# Patient Record
Sex: Female | Born: 1970
Health system: Southern US, Community
[De-identification: ages and names within clinical notes are randomized; demographics above are authoritative.]

## PROBLEM LIST (undated history)

## (undated) DIAGNOSIS — F419 Anxiety disorder, unspecified: Secondary | ICD-10-CM

## (undated) DIAGNOSIS — F17200 Nicotine dependence, unspecified, uncomplicated: Secondary | ICD-10-CM

## (undated) DIAGNOSIS — M47816 Spondylosis without myelopathy or radiculopathy, lumbar region: Secondary | ICD-10-CM

## (undated) DIAGNOSIS — M1990 Unspecified osteoarthritis, unspecified site: Secondary | ICD-10-CM

## (undated) DIAGNOSIS — F32A Depression, unspecified: Secondary | ICD-10-CM

## (undated) DIAGNOSIS — K219 Gastro-esophageal reflux disease without esophagitis: Secondary | ICD-10-CM

## (undated) DIAGNOSIS — F4310 Post-traumatic stress disorder, unspecified: Secondary | ICD-10-CM

## (undated) DIAGNOSIS — I639 Cerebral infarction, unspecified: Secondary | ICD-10-CM

## (undated) DIAGNOSIS — M797 Fibromyalgia: Secondary | ICD-10-CM

## (undated) DIAGNOSIS — I82409 Acute embolism and thrombosis of unspecified deep veins of unspecified lower extremity: Secondary | ICD-10-CM

## (undated) DIAGNOSIS — G4730 Sleep apnea, unspecified: Secondary | ICD-10-CM

## (undated) DIAGNOSIS — E669 Obesity, unspecified: Secondary | ICD-10-CM

## (undated) DIAGNOSIS — G709 Myoneural disorder, unspecified: Secondary | ICD-10-CM

## (undated) DIAGNOSIS — F329 Major depressive disorder, single episode, unspecified: Secondary | ICD-10-CM

## (undated) DIAGNOSIS — R7303 Prediabetes: Secondary | ICD-10-CM

## (undated) DIAGNOSIS — G35 Multiple sclerosis: Secondary | ICD-10-CM

## (undated) DIAGNOSIS — G43909 Migraine, unspecified, not intractable, without status migrainosus: Secondary | ICD-10-CM

## (undated) DIAGNOSIS — I10 Essential (primary) hypertension: Secondary | ICD-10-CM

## (undated) DIAGNOSIS — R569 Unspecified convulsions: Secondary | ICD-10-CM

## (undated) DIAGNOSIS — IMO0001 Reserved for inherently not codable concepts without codable children: Secondary | ICD-10-CM

## (undated) HISTORY — DX: Cerebral infarction, unspecified: I63.9

## (undated) HISTORY — DX: Nicotine dependence, unspecified, uncomplicated: F17.200

## (undated) HISTORY — DX: Essential (primary) hypertension: I10

## (undated) HISTORY — DX: Migraine, unspecified, not intractable, without status migrainosus: G43.909

## (undated) HISTORY — DX: Reserved for inherently not codable concepts without codable children: IMO0001

## (undated) HISTORY — DX: Major depressive disorder, single episode, unspecified: F32.9

## (undated) HISTORY — DX: Unspecified convulsions: R56.9

## (undated) HISTORY — DX: Anxiety disorder, unspecified: F41.9

## (undated) HISTORY — PX: TONSILLECTOMY: SUR1361

## (undated) HISTORY — DX: Obesity, unspecified: E66.9

## (undated) HISTORY — DX: Depression, unspecified: F32.A

## (undated) HISTORY — DX: Multiple sclerosis: G35

## (undated) HISTORY — DX: Spondylosis without myelopathy or radiculopathy, lumbar region: M47.816

## (undated) HISTORY — DX: Acute embolism and thrombosis of unspecified deep veins of unspecified lower extremity: I82.409

## (undated) NOTE — *Deleted (*Deleted)
Based on recent imaging results, you have a new diagnosis of atherosclerosis.  This condition affects the blood vessels all over your body and significantly increases your risks of heart attack, stroke, kidney disease, and peripheral vascular disease in your legs and arms.   With known atherosclerosis, cholesterol must be managed very well to help reduce further build up in the blood vessels.  We recommend HDL (good cholesterol) greater than 50 and LDL (bad cholesterol) less than 70. In order to get to these numbers, strict diet management and a medication to help lower your cholesterol is recommended.   I recommend the following to help prevent further damage and complications:  Cholesterol medication, Crestor (rosuvastatin) 10 mg daily.   Low fat, cholesterol, and carbohydrate diet   Exercise (walking is great!) 30 minutes a day for at least 5 days out of the week.   Stop smoking- We can help with this. You can also call Call 1800-QUIT-NOW for help with stopping smoking. This resources helps by providing support and supplies to help with quitting.   All of the above recommendations will also help with your high blood pressure, fatty liver, pre-diabetes and blood sugar levels, and weight.   We will need to check labs in about 10 weeks to make sure the medication is doing what we expect and you are not experiencing any side effects. Please schedule a follow-up appointment in about 12 weeks and plan to have labs drawn in about 10 weeks to evaluate. If labs look good, we can monitor every 6 months to 1 year.   Please read the following information provided and let me know if you have any questions or concerns.   Atherosclerosis  Atherosclerosis is narrowing and hardening of the arteries. Arteries are blood vessels that carry blood from the heart to all parts of the body. This blood contains oxygen. Arteries can become narrow or clogged with a buildup of fat, cholesterol, calcium, and other  substances (plaque). Plaque decreases the amount of blood that can flow through the artery. Atherosclerosis can affect any artery in the body, including:  Heart arteries (coronary artery disease). This may cause a heart attack.  Brain arteries. This may cause a stroke (cerebrovascular accident).  Leg, arm, and pelvis arteries (peripheral artery disease). This may cause pain and numbness.  Kidney arteries. This may cause kidney (renal) failure. Treatment may slow the disease and prevent further damage to the heart, brain, peripheral arteries, and kidneys. What are the causes? Atherosclerosis develops slowly over many years. The inner layers of your arteries become damaged and allow the gradual buildup of plaque. The exact cause of atherosclerosis is not fully understood. Symptoms of atherosclerosis do not occur until the artery becomes narrow or blocked. What increases the risk? The following factors may make you more likely to develop this condition:  High blood pressure.  High cholesterol.  Being middle-aged or older.  Having a family history of atherosclerosis.  Having high blood fats (triglycerides).  Diabetes.  Being overweight.  Smoking tobacco.  Not exercising enough (sedentary lifestyle).  Having a substance in the blood called C-reactive protein (CRP). This is a sign of increased levels of inflammation in the body.  Sleep apnea.  Being stressed.  Drinking too much alcohol. What are the signs or symptoms? This condition may not cause any symptoms. If you have symptoms, they are caused by damage to an area of your body that is not getting enough blood.  Coronary artery disease may cause chest pain and  shortness of breath.  Decreased blood supply to your brain may cause a stroke. Signs of a stroke may include sudden: ? Weakness on one side of the body. ? Confusion. ? Changes in vision. ? Inability to speak or understand speech. ? Loss of balance, coordination,  or the ability to walk. ? Severe headache. ? Loss of consciousness.  Peripheral arterial disease may cause pain and numbness, often in the legs and hips.  Renal failure may cause fatigue, nausea, swelling, and itchy skin. How is this diagnosed? This condition is diagnosed based on your medical history and a physical exam. During the exam:  Your health care provider will: ? Check your pulse in different places. ? Listen for a "whooshing" sound over your arteries (bruit).  You may have tests, such as: ? Blood tests to check your levels of cholesterol, triglycerides, and CRP. ? Electrocardiogram (ECG) to check for heart damage. ? Chest X-ray to see if you have an enlarged heart, which is a sign of heart failure. ? Stress test to see how your heart reacts to exercise. ? Echocardiogram to get images of the inside of your heart. ? Ankle-brachial index to compare blood pressure in your arms to blood pressure in your ankles. ? Ultrasound of your peripheral arteries to check blood flow. ? CT scan to check for damage to your heart or brain. ? X-rays of blood vessels after dye has been injected (angiogram) to check blood flow. How is this treated? Treatment starts with lifestyle changes, which may include:  Changing your diet.  Losing weight.  Reducing stress.  Exercising and being physically active more regularly.  Not smoking. You may also need medicine to:  Lower triglycerides and cholesterol.  Control blood pressure.  Prevent blood clots.  Lower inflammation in your body.  Control your blood sugar. Sometimes, surgery is needed to:  Remove plaque from an artery (endarterectomy).  Open or widen a narrowed heart artery (angioplasty).  Create a new path for your blood with one of these procedures: ? Heart (coronary) artery bypass graft surgery. ? Peripheral artery bypass graft surgery. Follow these instructions at home: Eating and drinking   Eat a heart-healthy diet.  Talk with your health care provider or a diet and nutrition specialist (dietitian) if you need help. A heart-healthy diet involves: ? Limiting unhealthy fats and increasing healthy fats. Some examples of healthy fats are olive oil and canola oil. ? Eating plant-based foods, such as fruits, vegetables, nuts, whole grains, and legumes (such as peas and lentils).  Limit alcohol intake to no more than 1 drink a day for nonpregnant women and 2 drinks a day for men. One drink equals 12 oz of beer, 5 oz of wine, or 1 oz of hard liquor. Lifestyle  Follow an exercise program as told by your health care provider.  Maintain a healthy weight. Lose weight if your health care provider says that you need to do that.  Rest when you are tired.  Learn to manage your stress.  Do not use any products that contain nicotine or tobacco, such as cigarettes and e-cigarettes. If you need help quitting, ask your health care provider.  Do not abuse drugs. General instructions  Take over-the-counter and prescription medicines only as told by your health care provider.  Manage other health conditions as told by your health care provider.  Keep all follow-up visits as told by your health care provider. This is important. Contact a health care provider if:  You have chest pain  or discomfort. This includes squeezing chest pain that may feel like indigestion (angina).  You have shortness of breath.  You have an irregular heartbeat.  You have unexplained fatigue.  You have unexplained pain or numbness in an arm, leg, or hip.  You have nausea, swelling of your hands or feet, and itchy skin. Get help right away if:  You have any symptoms of a heart attack, such as: ? Chest pain. ? Shortness of breath. ? Pain in your neck, jaw, arms, back, or stomach. ? Cold sweat. ? Nausea. ? Light-headedness.  You have any symptoms of a stroke. "BE FAST" is an easy way to remember the main warning signs of a stroke: ?  B - Balance. Signs are dizziness, sudden trouble walking, or loss of balance. ? E - Eyes. Signs are trouble seeing or a sudden change in vision. ? F - Face. Signs are sudden weakness or numbness of the face, or the face or eyelid drooping on one side. ? A - Arms. Signs are weakness or numbness in an arm. This happens suddenly and usually on one side of the body. ? S - Speech. Signs are sudden trouble speaking, slurred speech, or trouble understanding what people say. ? T - Time. Time to call emergency services. Write down what time symptoms started.  You have other signs of a stroke, such as: ? A sudden, severe headache with no known cause. ? Nausea or vomiting. ? Seizure. These symptoms may represent a serious problem that is an emergency. Do not wait to see if the symptoms will go away. Get medical help right away. Call your local emergency services (911 in the [place] Do not drive yourself to the hospital. Summary  Atherosclerosis is narrowing and hardening of the arteries.  Arteries can become narrow or clogged with a buildup of fat, cholesterol, calcium, and other substances (plaque).  This condition may not cause any symptoms. If you do have symptoms, they are caused by damage to an area of your body that is not getting enough blood.  Treatment may include lifestyle changes and medicines. In some cases, surgery is needed. This information is not intended to replace advice given to you by your health care provider. Make sure you discuss any questions you have with your health care provider. Document Revised: 08/10/2017 Document Reviewed: 01/04/2017 Elsevier Patient Education  2020 Elsevier Inc.  Rosuvastatin Tablets What is this medicine? ROSUVASTATIN (roe SOO va sta tin) is known as a HMG-CoA reductase inhibitor or 'statin'. It lowers cholesterol and triglycerides in the blood. This drug may also reduce the risk of heart attack, stroke, or other health problems in patients with risk  factors for heart disease. Diet and lifestyle changes are often used with this drug. This medicine may be used for other purposes; ask your health care provider or pharmacist if you have questions. COMMON BRAND NAME(S): Crestor What should I tell my health care provider before I take this medicine? They need to know if you have any of these conditions:  diabetes  if you often drink alcohol  history of stroke  kidney disease  liver disease  muscle aches or weakness  thyroid disease  an unusual or allergic reaction to rosuvastatin, other medicines, foods, dyes, or preservatives  pregnant or trying to get pregnant  breast-feeding How should I use this medicine? Take this medicine by mouth with a glass of water. Follow the directions on the prescription label. Do not cut, crush or chew this medicine. You can  take this medicine with or without food. Take your doses at regular intervals. Do not take your medicine more often than directed. Talk to your pediatrician regarding the use of this medicine in children. While this drug may be prescribed for children as young as 81 years old for selected conditions, precautions do apply. Overdosage: If you think you have taken too much of this medicine contact a poison control center or emergency room at once. NOTE: This medicine is only for you. Do not share this medicine with others. What if I miss a dose? If you miss a dose, take it as soon as you can. If your next dose is to be taken in less than 12 hours, then do not take the missed dose. Take the next dose at your regular time. Do not take double or extra doses. What may interact with this medicine? Do not take this medicine with any of the following medications:  herbal medicines like red yeast rice This medicine may also interact with the following medications:  alcohol  antacids containing aluminum hydroxide or magnesium hydroxide  cyclosporine  other medicines for high  cholesterol  some medicines for HIV infection  warfarin This list may not describe all possible interactions. Give your health care provider a list of all the medicines, herbs, non-prescription drugs, or dietary supplements you use. Also tell them if you smoke, drink alcohol, or use illegal drugs. Some items may interact with your medicine. What should I watch for while using this medicine? Visit your doctor or health care professional for regular check-ups. You may need regular tests to make sure your liver is working properly. Your health care professional may tell you to stop taking this medicine if you develop muscle problems. If your muscle problems do not go away after stopping this medicine, contact your health care professional. Do not become pregnant while taking this medicine. Women should inform their health care professional if they wish to become pregnant or think they might be pregnant. There is a potential for serious side effects to an unborn child. Talk to your health care professional or pharmacist for more information. Do not breast-feed an infant while taking this medicine. This medicine may increase blood sugar. Ask your healthcare provider if changes in diet or medicines are needed if you have diabetes. If you are going to need surgery or other procedure, tell your doctor that you are using this medicine. This drug is only part of a total heart-health program. Your doctor or a dietician can suggest a low-cholesterol and low-fat diet to help. Avoid alcohol and smoking, and keep a proper exercise schedule. This medicine may cause a decrease in Co-Enzyme Q-10. You should make sure that you get enough Co-Enzyme Q-10 while you are taking this medicine. Discuss the foods you eat and the vitamins you take with your health care professional. What side effects may I notice from receiving this medicine? Side effects that you should report to your doctor or health care professional as soon  as possible:  allergic reactions like skin rash, itching or hives, swelling of the face, lips, or tongue  confusion  joint pain  loss of memory  redness, blistering, peeling or loosening of the skin, including inside the mouth  signs and symptoms of high blood sugar such as being more thirsty or hungry or having to urinate more than normal. You may also feel very tired or have blurry vision.  signs and symptoms of muscle injury like dark urine; trouble passing urine  or change in the amount of urine; unusually weak or tired; muscle pain or side or back pain  yellowing of the eyes or skin Side effects that usually do not require medical attention (report to your doctor or health care professional if they continue or are bothersome):  constipation  diarrhea  dizziness  gas  headache  nausea  stomach pain  trouble sleeping  upset stomach This list may not describe all possible side effects. Call your doctor for medical advice about side effects. You may report side effects to FDA at 1-800-FDA-1088. Where should I keep my medicine? Keep out of the reach of children. Store at room temperature between 20 and 25 degrees C (68 and 77 degrees F). Keep container tightly closed (protect from moisture). Throw away any unused medicine after the expiration date. NOTE: This sheet is a summary. It may not cover all possible information. If you have questions about this medicine, talk to your doctor, pharmacist, or health care provider.  2020 Elsevier/Gold Standard (2018-02-21 08:25:08)   Steps to Quit Smoking Smoking tobacco is the leading cause of preventable death. It can affect almost every organ in the body. Smoking puts you and people around you at risk for many serious, long-lasting (chronic) diseases. Quitting smoking can be hard, but it is one of the best things that you can do for your health. It is never too late to quit. How do I get ready to quit? When you decide to quit  smoking, make a plan to help you succeed. Before you quit:  Pick a date to quit. Set a date within the next 2 weeks to give you time to prepare.  Write down the reasons why you are quitting. Keep this list in places where you will see it often.  Tell your family, friends, and co-workers that you are quitting. Their support is important.  Talk with your doctor about the choices that may help you quit.  Find out if your health insurance will pay for these treatments.  Know the people, places, things, and activities that make you want to smoke (triggers). Avoid them. What first steps can I take to quit smoking?  Throw away all cigarettes at home, at work, and in your car.  Throw away the things that you use when you smoke, such as ashtrays and lighters.  Clean your car. Make sure to empty the ashtray.  Clean your home, including curtains and carpets. What can I do to help me quit smoking? Talk with your doctor about taking medicines and seeing a counselor at the same time. You are more likely to succeed when you do both.  If you are pregnant or breastfeeding, talk with your doctor about counseling or other ways to quit smoking. Do not take medicine to help you quit smoking unless your doctor tells you to do so. To quit smoking: Quit right away  Quit smoking totally, instead of slowly cutting back on how much you smoke over a period of time.  Go to counseling. You are more likely to quit if you go to counseling sessions regularly. Take medicine You may take medicines to help you quit. Some medicines need a prescription, and some you can buy over-the-counter. Some medicines may contain a drug called nicotine to replace the nicotine in cigarettes. Medicines may:  Help you to stop having the desire to smoke (cravings).  Help to stop the problems that come when you stop smoking (withdrawal symptoms). Your doctor may ask you to use:  Nicotine patches, gum, or lozenges.  Nicotine  inhalers or sprays.  Non-nicotine medicine that is taken by mouth. Find resources Find resources and other ways to help you quit smoking and remain smoke-free after you quit. These resources are most helpful when you use them often. They include:  Online chats with a Veterinary surgeon.  Phone quitlines.  Printed Materials engineer.  Support groups or group counseling.  Text messaging programs.  Mobile phone apps. Use apps on your mobile phone or tablet that can help you stick to your quit plan. There are many free apps for mobile phones and tablets as well as websites. Examples include Quit Guide from the [employer] and smokefree.gov  What things can I do to make it easier to quit?   Talk to your family and friends. Ask them to support and encourage you.  Call a phone quitline (1-800-QUIT-NOW), reach out to support groups, or work with a Veterinary surgeon.  Ask people who smoke to not smoke around you.  Avoid places that make you want to smoke, such as: ? Bars. ? Parties. ? Smoke-break areas at work.  Spend time with people who do not smoke.  Lower the stress in your life. Stress can make you want to smoke. Try these things to help your stress: ? Getting regular exercise. ? Doing deep-breathing exercises. ? Doing yoga. ? Meditating. ? Doing a body scan. To do this, close your eyes, focus on one area of your body at a time from head to toe. Notice which parts of your body are tense. Try to relax the muscles in those areas. How will I feel when I quit smoking? Day 1 to 3 weeks Within the first 24 hours, you may start to have some problems that come from quitting tobacco. These problems are very bad 2-3 days after you quit, but they do not often last for more than 2-3 weeks. You may get these symptoms:  Mood swings.  Feeling restless, nervous, angry, or annoyed.  Trouble concentrating.  Dizziness.  Strong desire for high-sugar foods and nicotine.  Weight gain.  Trouble pooping  (constipation).  Feeling like you may vomit (nausea).  Coughing or a sore throat.  Changes in how the medicines that you take for other issues work in your body.  Depression.  Trouble sleeping (insomnia). Week 3 and afterward After the first 2-3 weeks of quitting, you may start to notice more positive results, such as:  Better sense of smell and taste.  Less coughing and sore throat.  Slower heart rate.  Lower blood pressure.  Clearer skin.  Better breathing.  Fewer sick days. Quitting smoking can be hard. Do not give up if you fail the first time. Some people need to try a few times before they succeed. Do your best to stick to your quit plan, and talk with your doctor if you have any questions or concerns. Summary  Smoking tobacco is the leading cause of preventable death. Quitting smoking can be hard, but it is one of the best things that you can do for your health.  When you decide to quit smoking, make a plan to help you succeed.  Quit smoking right away, not slowly over a period of time.  When you start quitting, seek help from your doctor, family, or friends. This information is not intended to replace advice given to you by your health care provider. Make sure you discuss any questions you have with your health care provider. Document Revised: 01/24/2019 Document Reviewed: 07/20/2018 Elsevier  Patient Education  [employer].  Exercising to Stay Healthy To become healthy and stay healthy, it is recommended that you do moderate-intensity and vigorous-intensity exercise. You can tell that you are exercising at a moderate intensity if your heart starts beating faster and you start breathing faster but can still hold a conversation. You can tell that you are exercising at a vigorous intensity if you are breathing much harder and faster and cannot hold a conversation while exercising. Exercising regularly is important. It has many health benefits, such as:   Improving overall fitness, flexibility, and endurance.  Increasing bone density.  Helping with weight control.  Decreasing body fat.  Increasing muscle strength.  Reducing stress and tension.  Improving overall health. How often should I exercise? Choose an activity that you enjoy, and set realistic goals. Your health care provider can help you make an activity plan that works for you. Exercise regularly as told by your health care provider. This may include:  Doing strength training two times a week, such as: ? Lifting weights. ? Using resistance bands. ? Push-ups. ? Sit-ups. ? Yoga.  Doing a certain intensity of exercise for a given amount of time. Choose from these options: ? A total of 150 minutes of moderate-intensity exercise every week. ? A total of 75 minutes of vigorous-intensity exercise every week. ? A mix of moderate-intensity and vigorous-intensity exercise every week. Children, pregnant women, people who have not exercised regularly, people who are overweight, and older adults may need to talk with a health care provider about what activities are safe to do. If you have a medical condition, be sure to talk with your health care provider before you start a new exercise program. What are some exercise ideas? Moderate-intensity exercise ideas include:  Walking 1 mile (1.6 km) in about 15 minutes.  Biking.  Hiking.  Golfing.  Dancing.  Water aerobics. Vigorous-intensity exercise ideas include:  Walking 4.5 miles (7.2 km) or more in about 1 hour.  Jogging or running 5 miles (8 km) in about 1 hour.  Biking 10 miles (16.1 km) or more in about 1 hour.  Lap swimming.  Roller-skating or in-line skating.  Cross-country skiing.  Vigorous competitive sports, such as football, basketball, and soccer.  Jumping rope.  Aerobic dancing. What are some everyday activities that can help me to get exercise?  Yard work, such as: ? Pushing a Surveyor, mining. ?  Raking and bagging leaves.  Washing your car.  Pushing a stroller.  Shoveling snow.  Gardening.  Washing windows or floors. How can I be more active in my day-to-day activities?  Use stairs instead of an elevator.  Take a walk during your lunch break.  If you drive, park your car farther away from your work or school.  If you take public transportation, get off one stop early and walk the rest of the way.  Stand up or walk around during all of your indoor phone calls.  Get up, stretch, and walk around every 30 minutes throughout the day.  Enjoy exercise with a friend. Support to continue exercising will help you keep a regular routine of activity. What guidelines can I follow while exercising?  Before you start a new exercise program, talk with your health care provider.  Do not exercise so much that you hurt yourself, feel dizzy, or get very short of breath.  Wear comfortable clothes and wear shoes with good support.  Drink plenty of water while you exercise to prevent dehydration or  heat stroke.  Work out until your breathing and your heartbeat get faster. Where to find more information  U.S. Department of Health and Human Services: ThisPath.fi  Centers for Disease Control and Prevention (CDC): FootballExhibition.com.br Summary  Exercising regularly is important. It will improve your overall fitness, flexibility, and endurance.  Regular exercise also will improve your overall health. It can help you control your weight, reduce stress, and improve your bone density.  Do not exercise so much that you hurt yourself, feel dizzy, or get very short of breath.  Before you start a new exercise program, talk with your health care provider. This information is not intended to replace advice given to you by your health care provider. Make sure you discuss any questions you have with your health care provider. Document Revised: 04/13/2017 Document Reviewed: 03/22/2017 Elsevier Patient  Education  2020 [employer].

---

## 1990-05-15 HISTORY — PX: TUBAL LIGATION: SHX77

## 2004-08-11 ENCOUNTER — Encounter: Payer: Self-pay | Admitting: Family Medicine

## 2004-08-11 LAB — HM PAP SMEAR

## 2004-08-11 LAB — CONVERTED CEMR LAB: Pap Smear: NORMAL

## 2007-07-17 LAB — HM MAMMOGRAPHY: HM Mammogram: NORMAL

## 2009-03-09 ENCOUNTER — Ambulatory Visit: Payer: Self-pay | Admitting: Family Medicine

## 2009-03-09 DIAGNOSIS — G43009 Migraine without aura, not intractable, without status migrainosus: Secondary | ICD-10-CM | POA: Insufficient documentation

## 2009-03-09 DIAGNOSIS — I10 Essential (primary) hypertension: Secondary | ICD-10-CM | POA: Insufficient documentation

## 2009-03-09 DIAGNOSIS — M47816 Spondylosis without myelopathy or radiculopathy, lumbar region: Secondary | ICD-10-CM | POA: Insufficient documentation

## 2009-03-09 DIAGNOSIS — E669 Obesity, unspecified: Secondary | ICD-10-CM

## 2009-03-09 DIAGNOSIS — G35 Multiple sclerosis: Secondary | ICD-10-CM | POA: Insufficient documentation

## 2009-03-09 DIAGNOSIS — M5136 Other intervertebral disc degeneration, lumbar region: Secondary | ICD-10-CM | POA: Insufficient documentation

## 2009-03-09 DIAGNOSIS — F17200 Nicotine dependence, unspecified, uncomplicated: Secondary | ICD-10-CM | POA: Insufficient documentation

## 2009-03-09 DIAGNOSIS — F411 Generalized anxiety disorder: Secondary | ICD-10-CM | POA: Insufficient documentation

## 2009-03-10 LAB — CONVERTED CEMR LAB
ALT: 9 units/L (ref 0–35)
AST: 16 units/L (ref 0–37)
Albumin: 3.9 g/dL (ref 3.5–5.2)
Alkaline Phosphatase: 62 units/L (ref 39–117)
BUN: 13 mg/dL (ref 6–23)
CO2: 27 meq/L (ref 19–32)
Calcium: 9.4 mg/dL (ref 8.4–10.5)
Chloride: 104 meq/L (ref 96–112)
Cholesterol: 190 mg/dL (ref 0–200)
Creatinine, Ser: 0.81 mg/dL (ref 0.40–1.20)
Glucose, Bld: 93 mg/dL (ref 70–99)
HCT: 46 % (ref 36.0–46.0)
HDL: 40 mg/dL (ref >39)
Hemoglobin: 14.5 g/dL (ref 12.0–15.0)
LDL Cholesterol: 124 mg/dL — ABNORMAL HIGH (ref 0–99)
MCHC: 31.5 g/dL (ref 30.0–36.0)
MCV: 89.8 fL (ref 78.0–100.0)
Platelets: 262 10*3/uL (ref 150–400)
Potassium: 5 meq/L (ref 3.5–5.3)
RBC: 5.12 M/uL — ABNORMAL HIGH (ref 3.87–5.11)
RDW: 13.7 % (ref 11.5–15.5)
Sodium: 140 meq/L (ref 135–145)
Total Bilirubin: 0.4 mg/dL (ref 0.3–1.2)
Total CHOL/HDL Ratio: 4.8
Total Protein: 6.3 g/dL (ref 6.0–8.3)
Triglycerides: 129 mg/dL (ref <150)
VLDL: 26 mg/dL (ref 0–40)
WBC: 7.7 10*3/uL (ref 4.0–10.5)

## 2009-03-29 ENCOUNTER — Telehealth: Payer: Self-pay | Admitting: Family Medicine

## 2009-04-21 ENCOUNTER — Telehealth: Payer: Self-pay | Admitting: Family Medicine

## 2009-05-15 DIAGNOSIS — R569 Unspecified convulsions: Secondary | ICD-10-CM

## 2009-05-15 HISTORY — DX: Unspecified convulsions: R56.9

## 2009-05-16 ENCOUNTER — Encounter: Payer: Self-pay | Admitting: Family Medicine

## 2009-05-16 DIAGNOSIS — F331 Major depressive disorder, recurrent, moderate: Secondary | ICD-10-CM | POA: Insufficient documentation

## 2009-06-01 ENCOUNTER — Ambulatory Visit: Payer: Self-pay | Admitting: Family Medicine

## 2009-06-01 DIAGNOSIS — N926 Irregular menstruation, unspecified: Secondary | ICD-10-CM | POA: Insufficient documentation

## 2009-06-01 DIAGNOSIS — J019 Acute sinusitis, unspecified: Secondary | ICD-10-CM | POA: Insufficient documentation

## 2009-06-14 ENCOUNTER — Telehealth (INDEPENDENT_AMBULATORY_CARE_PROVIDER_SITE_OTHER): Payer: Self-pay | Admitting: *Deleted

## 2009-06-14 ENCOUNTER — Encounter: Payer: Self-pay | Admitting: Family Medicine

## 2009-06-17 ENCOUNTER — Ambulatory Visit: Payer: Self-pay | Admitting: Family Medicine

## 2009-06-29 ENCOUNTER — Telehealth (INDEPENDENT_AMBULATORY_CARE_PROVIDER_SITE_OTHER): Payer: Self-pay | Admitting: *Deleted

## 2009-07-02 ENCOUNTER — Telehealth: Payer: Self-pay | Admitting: Family Medicine

## 2009-07-13 ENCOUNTER — Ambulatory Visit: Payer: Self-pay | Admitting: Family Medicine

## 2009-07-20 ENCOUNTER — Telehealth (INDEPENDENT_AMBULATORY_CARE_PROVIDER_SITE_OTHER): Payer: Self-pay | Admitting: *Deleted

## 2009-07-21 ENCOUNTER — Telehealth: Payer: Self-pay | Admitting: Family Medicine

## 2009-08-05 ENCOUNTER — Encounter: Payer: Self-pay | Admitting: Family Medicine

## 2009-09-15 ENCOUNTER — Telehealth: Payer: Self-pay | Admitting: Family Medicine

## 2009-12-01 ENCOUNTER — Telehealth: Payer: Self-pay | Admitting: Family Medicine

## 2009-12-13 DIAGNOSIS — I82409 Acute embolism and thrombosis of unspecified deep veins of unspecified lower extremity: Secondary | ICD-10-CM

## 2009-12-13 HISTORY — DX: Acute embolism and thrombosis of unspecified deep veins of unspecified lower extremity: I82.409

## 2010-01-06 ENCOUNTER — Encounter: Payer: Self-pay | Admitting: Family Medicine

## 2010-01-09 ENCOUNTER — Encounter: Payer: Self-pay | Admitting: Family Medicine

## 2010-01-10 ENCOUNTER — Encounter: Payer: Self-pay | Admitting: Family Medicine

## 2010-01-13 ENCOUNTER — Ambulatory Visit: Payer: Self-pay | Admitting: Family Medicine

## 2010-01-13 DIAGNOSIS — Z86718 Personal history of other venous thrombosis and embolism: Secondary | ICD-10-CM | POA: Insufficient documentation

## 2010-01-13 DIAGNOSIS — R569 Unspecified convulsions: Secondary | ICD-10-CM | POA: Insufficient documentation

## 2010-01-13 LAB — CONVERTED CEMR LAB: INR: 1.5

## 2010-01-18 ENCOUNTER — Encounter: Payer: Self-pay | Admitting: Family Medicine

## 2010-01-19 ENCOUNTER — Telehealth: Payer: Self-pay | Admitting: Family Medicine

## 2010-01-19 ENCOUNTER — Encounter: Payer: Self-pay | Admitting: Family Medicine

## 2010-01-19 LAB — CONVERTED CEMR LAB
INR: 1.8
Prothrombin Time: 18.8 s

## 2010-01-26 ENCOUNTER — Ambulatory Visit: Payer: Self-pay | Admitting: Family Medicine

## 2010-01-26 LAB — CONVERTED CEMR LAB: INR: 1.7

## 2010-01-28 ENCOUNTER — Telehealth: Payer: Self-pay | Admitting: Family Medicine

## 2010-02-03 ENCOUNTER — Telehealth (INDEPENDENT_AMBULATORY_CARE_PROVIDER_SITE_OTHER): Payer: Self-pay | Admitting: *Deleted

## 2010-02-07 ENCOUNTER — Ambulatory Visit: Payer: Self-pay | Admitting: Family Medicine

## 2010-02-07 LAB — CONVERTED CEMR LAB: INR: 1.7

## 2010-02-10 ENCOUNTER — Ambulatory Visit (HOSPITAL_COMMUNITY): Payer: Self-pay | Admitting: Psychology

## 2010-02-14 ENCOUNTER — Ambulatory Visit: Payer: Self-pay | Admitting: Family Medicine

## 2010-02-14 ENCOUNTER — Telehealth: Payer: Self-pay | Admitting: Family Medicine

## 2010-02-14 LAB — CONVERTED CEMR LAB: INR: 2.3

## 2010-02-28 ENCOUNTER — Ambulatory Visit: Payer: Self-pay | Admitting: Family Medicine

## 2010-02-28 LAB — CONVERTED CEMR LAB: INR: 2.1

## 2010-03-10 ENCOUNTER — Ambulatory Visit: Payer: Self-pay | Admitting: Family Medicine

## 2010-03-10 ENCOUNTER — Encounter: Admission: RE | Admit: 2010-03-10 | Discharge: 2010-03-10 | Payer: Self-pay | Admitting: Family Medicine

## 2010-03-10 DIAGNOSIS — R05 Cough: Secondary | ICD-10-CM

## 2010-03-10 DIAGNOSIS — R1012 Left upper quadrant pain: Secondary | ICD-10-CM | POA: Insufficient documentation

## 2010-03-10 DIAGNOSIS — R059 Cough, unspecified: Secondary | ICD-10-CM | POA: Insufficient documentation

## 2010-03-10 DIAGNOSIS — R197 Diarrhea, unspecified: Secondary | ICD-10-CM | POA: Insufficient documentation

## 2010-03-10 LAB — CONVERTED CEMR LAB: INR: 4

## 2010-03-11 LAB — CONVERTED CEMR LAB
ALT: 12 units/L (ref 0–35)
AST: 19 units/L (ref 0–37)
Albumin: 4.1 g/dL (ref 3.5–5.2)
Alkaline Phosphatase: 57 units/L (ref 39–117)
Amylase: 22 units/L (ref 0–105)
BUN: 11 mg/dL (ref 6–23)
Basophils Absolute: 0 10*3/uL (ref 0.0–0.1)
Basophils Relative: 0 % (ref 0–1)
CO2: 20 meq/L (ref 19–32)
Calcium: 8.9 mg/dL (ref 8.4–10.5)
Chloride: 108 meq/L (ref 96–112)
Creatinine, Ser: 0.96 mg/dL (ref 0.40–1.20)
Eosinophils Absolute: 0.2 10*3/uL (ref 0.0–0.7)
Eosinophils Relative: 2 % (ref 0–5)
Glucose, Bld: 97 mg/dL (ref 70–99)
HCT: 44 % (ref 36.0–46.0)
Hemoglobin: 13.9 g/dL (ref 12.0–15.0)
Lipase: 18 units/L (ref 0–75)
Lymphocytes Relative: 28 % (ref 12–46)
Lymphs Abs: 1.9 10*3/uL (ref 0.7–4.0)
MCHC: 31.6 g/dL (ref 30.0–36.0)
MCV: 82.1 fL (ref 78.0–100.0)
Monocytes Absolute: 0.5 10*3/uL (ref 0.1–1.0)
Monocytes Relative: 8 % (ref 3–12)
Neutro Abs: 4.3 10*3/uL (ref 1.7–7.7)
Neutrophils Relative %: 62 % (ref 43–77)
Platelets: 282 10*3/uL (ref 150–400)
Potassium: 3.5 meq/L (ref 3.5–5.3)
RBC: 5.36 M/uL — ABNORMAL HIGH (ref 3.87–5.11)
RDW: 15.3 % (ref 11.5–15.5)
Sodium: 138 meq/L (ref 135–145)
TSH: 0.473 microintl units/mL (ref 0.350–4.500)
Total Bilirubin: 0.4 mg/dL (ref 0.3–1.2)
Total Protein: 6.8 g/dL (ref 6.0–8.3)
WBC: 6.9 10*3/uL (ref 4.0–10.5)

## 2010-03-14 ENCOUNTER — Ambulatory Visit: Payer: Self-pay | Admitting: Family Medicine

## 2010-03-14 ENCOUNTER — Telehealth: Payer: Self-pay | Admitting: Family Medicine

## 2010-03-14 LAB — CONVERTED CEMR LAB: INR: 2.1

## 2010-03-21 ENCOUNTER — Ambulatory Visit: Payer: Self-pay | Admitting: Family Medicine

## 2010-03-21 DIAGNOSIS — R112 Nausea with vomiting, unspecified: Secondary | ICD-10-CM | POA: Insufficient documentation

## 2010-03-21 LAB — CONVERTED CEMR LAB: INR: 2.8

## 2010-03-31 ENCOUNTER — Telehealth: Payer: Self-pay | Admitting: Family Medicine

## 2010-04-01 ENCOUNTER — Encounter: Payer: Self-pay | Admitting: Family Medicine

## 2010-04-04 ENCOUNTER — Ambulatory Visit: Payer: Self-pay | Admitting: Family Medicine

## 2010-04-04 DIAGNOSIS — R209 Unspecified disturbances of skin sensation: Secondary | ICD-10-CM | POA: Insufficient documentation

## 2010-04-04 DIAGNOSIS — R5381 Other malaise: Secondary | ICD-10-CM | POA: Insufficient documentation

## 2010-04-04 DIAGNOSIS — R5383 Other fatigue: Secondary | ICD-10-CM | POA: Insufficient documentation

## 2010-04-04 LAB — CONVERTED CEMR LAB: INR: 1.2

## 2010-04-05 ENCOUNTER — Encounter: Payer: Self-pay | Admitting: Family Medicine

## 2010-04-05 LAB — CONVERTED CEMR LAB
Basophils Absolute: 0 10*3/uL (ref 0.0–0.1)
Basophils Relative: 0 % (ref 0–1)
Eosinophils Absolute: 0.2 10*3/uL (ref 0.0–0.7)
Eosinophils Relative: 3 % (ref 0–5)
Folate: 4.7 ng/mL
HCT: 38.8 % (ref 36.0–46.0)
Hemoglobin: 12.1 g/dL (ref 12.0–15.0)
Lymphocytes Relative: 35 % (ref 12–46)
Lymphs Abs: 2.4 10*3/uL (ref 0.7–4.0)
MCHC: 31.2 g/dL (ref 30.0–36.0)
MCV: 84.3 fL (ref 78.0–100.0)
Monocytes Absolute: 0.6 10*3/uL (ref 0.1–1.0)
Monocytes Relative: 8 % (ref 3–12)
Neutro Abs: 3.8 10*3/uL (ref 1.7–7.7)
Neutrophils Relative %: 54 % (ref 43–77)
Platelets: 274 10*3/uL (ref 150–400)
RBC: 4.6 M/uL (ref 3.87–5.11)
RDW: 15.3 % (ref 11.5–15.5)
TSH: 1.268 microintl units/mL (ref 0.350–4.500)
Vitamin B-12: 372 pg/mL (ref 211–911)
WBC: 6.9 10*3/uL (ref 4.0–10.5)

## 2010-04-18 ENCOUNTER — Ambulatory Visit: Payer: Self-pay | Admitting: Family Medicine

## 2010-04-18 LAB — CONVERTED CEMR LAB: INR: 1.1

## 2010-04-26 ENCOUNTER — Ambulatory Visit: Payer: Self-pay | Admitting: Family Medicine

## 2010-04-26 LAB — CONVERTED CEMR LAB: INR: 1.1

## 2010-05-02 ENCOUNTER — Ambulatory Visit: Payer: Self-pay | Admitting: Family Medicine

## 2010-05-02 LAB — CONVERTED CEMR LAB: INR: 1.2

## 2010-05-10 ENCOUNTER — Encounter: Payer: Self-pay | Admitting: Family Medicine

## 2010-05-17 ENCOUNTER — Ambulatory Visit
Admission: RE | Admit: 2010-05-17 | Discharge: 2010-05-17 | Payer: Self-pay | Source: Home / Self Care | Attending: Family Medicine | Admitting: Family Medicine

## 2010-05-17 ENCOUNTER — Encounter: Payer: Self-pay | Admitting: Family Medicine

## 2010-05-17 DIAGNOSIS — R42 Dizziness and giddiness: Secondary | ICD-10-CM | POA: Insufficient documentation

## 2010-05-17 DIAGNOSIS — I8390 Asymptomatic varicose veins of unspecified lower extremity: Secondary | ICD-10-CM | POA: Insufficient documentation

## 2010-05-17 DIAGNOSIS — Z780 Asymptomatic menopausal state: Secondary | ICD-10-CM | POA: Insufficient documentation

## 2010-05-17 LAB — CONVERTED CEMR LAB
Beta hcg, urine, semiquantitative: NEGATIVE
Bilirubin Urine: NEGATIVE
Glucose, Urine, Semiquant: NEGATIVE
INR: 1.3
Ketones, urine, test strip: NEGATIVE
Nitrite: NEGATIVE
Protein, U semiquant: NEGATIVE
Specific Gravity, Urine: 1.015
Urobilinogen, UA: 0.2
WBC Urine, dipstick: NEGATIVE
pH: 6.5

## 2010-05-18 ENCOUNTER — Telehealth: Payer: Self-pay | Admitting: Family Medicine

## 2010-05-18 LAB — CONVERTED CEMR LAB
Ferritin: 16 ng/mL (ref 10–291)
HCT: 42.9 % (ref 36.0–46.0)
Hemoglobin: 13.7 g/dL (ref 12.0–15.0)
Iron: 27 ug/dL — ABNORMAL LOW (ref 42–145)
MCHC: 31.9 g/dL (ref 30.0–36.0)
MCV: 86.3 fL (ref 78.0–100.0)
Platelets: 270 10*3/uL (ref 150–400)
RBC: 4.97 M/uL (ref 3.87–5.11)
RDW: 16.7 % — ABNORMAL HIGH (ref 11.5–15.5)
Saturation Ratios: 8 % — ABNORMAL LOW (ref 20–55)
TIBC: 351 ug/dL (ref 250–470)
TSH: 0.853 microintl units/mL (ref 0.350–4.500)
UIBC: 324 ug/dL
WBC: 8 10*3/uL (ref 4.0–10.5)

## 2010-05-25 ENCOUNTER — Telehealth: Payer: Self-pay | Admitting: Family Medicine

## 2010-05-25 ENCOUNTER — Encounter: Payer: Self-pay | Admitting: Family Medicine

## 2010-05-30 ENCOUNTER — Telehealth (INDEPENDENT_AMBULATORY_CARE_PROVIDER_SITE_OTHER): Payer: Self-pay | Admitting: *Deleted

## 2010-06-14 NOTE — Progress Notes (Signed)
Summary: Fall and refills   Phone Note Call from Patient   Summary of Call: Pt called stating that she fell 3x in 3 days and has injured her knee. She said there is hard knot on her knee and is surronded by a red bruise that keeps getting larger. I advised Pt go to the ED bc she may have a blood clot. Pt asked about refills on her pain meds and benzo and I informed Pt that they were too early to fill. Pt started saying that she was getting a higher dose of vicodin and was getting # 100 every month. I advised Pt that Dr. Cathey Endow will not change her dose or # at this time. I said she was supposed to see specialist for this but SHE has yet to go. Pt upest that it took 3 months to receive old records, I told Pt that records are out of our control. I reminded Pt that she called today for the ? blood clot in her leg and she should be more concerned about that at this and less concerned w/ her refills. Pt agreed.  Initial call taken by: Payton Spark CMA,  June 29, 2009 10:40 AM     Appended Document: Fall and refills  I will not fill her pain meds or benzos early.   she has been referred out for her "MS pain".  Seymour Bars, D.O.

## 2010-06-14 NOTE — Assessment & Plan Note (Signed)
Summary: INR  Nurse Visit   Vitals Entered By: Payton Spark CMA (March 21, 2010 10:16 AM) CC: Still c/o abd pain and loss of appetite.   Allergies: 1)  Ace Inhibitors Laboratory Results   Blood Tests      INR: 2.8   (Normal Range: 0.88-1.12   Therap INR: 2.0-3.5)    Orders Added: 1)  Fingerstick [36416] 2)  Protime INR [[address]]   Anticoagulation Management History:      The patient is on coumadin and comes in today for a routine follow up visit.  She is being anticoagulated because of the first episode of deep venous thrombosis and/or pulmonary embolism.  Anticipated length of treatment is 3-6 months.  Her last INR was 2.1 and today's INR is 2.8.    Anticoagulation Management Assessment/Plan:      The target INR is 2.0-3.0.         Current Anticoagulation Instructions: The patient is to continue with the same dose of coumadin.  This dosage includes:    Recheck FRI with Office visit for GI complaints  .Coumadin 3 mg tabs and Coumadin 3 mg tabs:  Sunday - 3 mg, Monday - 3 mg, Tuesday - 3 mg, Wednesday - 3 mg, Thursday - 3 mg, Friday - 3 mg, Saturday - 3 mg.     Appended Document: INR

## 2010-06-14 NOTE — Assessment & Plan Note (Signed)
Summary: sinusitis/ pain/ menses   Vital Signs:  Patient profile:   40 year old female Menstrual status:  regular Height:      66 inches Weight:      256 pounds BMI:     41.47 O2 Sat:      99 % on Room air Temp:     98.2 degrees F oral Pulse rate:   104 / minute BP sitting:   144 / 94  (left arm) Cuff size:   large  Vitals Entered By: Payton Spark CMA (June 01, 2009 2:12 PM)  O2 Flow:  Room air CC: F/U    Primary Care Provider:  Seymour Bars DO  CC:  F/U .  History of Present Illness: 40 yo WF presents for f/u visit.  She reports having head congestion x 1 month.  She started off with cold/ flu symptoms.  She still has head congestion and some cough.  No chest pain or SOB. She tried multiple cold meds.  She has pain over her frontal sinus and over maxillary region.  She is a smoker.  She has yet to see Dr Daphane Shepherd for MS and chronic sciatica.  She was previously on Vicodin 4 per day but I did not RF this for her.  She is taking Valium at night and Alprazolam in the morning.        Current Medications (verified): 1)  Topamax 100 Mg Tabs (Topiramate) .... Take 1 Tab By Mouth Two Times A Day 2)  Lisinopril 10 Mg Tabs (Lisinopril) .Marland Kitchen.. 1 Tab By Mouth Daily 3)  Diazepam 5 Mg Tabs (Diazepam) .Marland Kitchen.. 1 Tab By Mouth At Bedtime As Needed Back Pain3 4)  Alprazolam 1 Mg Tabs (Alprazolam) .Marland Kitchen.. 1 Tab By Mouth Qam As Needed Anxiety  Allergies (verified): 1)  Ace Inhibitors  Review of Systems      See HPI  Physical Exam  General:  obese WF in NAD, smells of cigarette smoke Head:  normocephalic and atraumatic.  sinuses TTP (maxilllary) Eyes:  conjunctiva clear Ears:  EACs patent; TMs translucent and gray with good cone of light and bony landmarks.  Nose:  nasal congestion present Mouth:  throat mildly injected Neck:  no masses.   Lungs:  Normal respiratory effort, chest expands symmetrically. Lungs are clear to auscultation, no crackles or wheezes. Heart:  Normal rate and  regular rhythm. S1 and S2 normal without gallop, murmur, click, rub or other extra sounds. Msk:  no joint swelling, no joint warmth, and no redness over joints.   Extremities:  no LE edema Neurologic:  gait normal.   Skin:  color normal.   Cervical Nodes:  No lymphadenopathy noted Psych:  flat affect.     Impression & Recommendations:  Problem # 1:  SCIATICA, CHRONIC (ICD-724.3) She has yet to f/u with Neuro for sciatica and MS.   We discussed appropriate treatment for both conditions.   I have agreed to fill #40 Vicodin for severe pain.  Her updated medication list for this problem includes:    Vicodin 5-500 Mg Tabs (Hydrocodone-acetaminophen) .Marland Kitchen... 1 tab by mouth q 8 hrs as needed severe pain  Problem # 2:  ESSENTIAL HYPERTENSION, BENIGN (ICD-401.1) Had ACEi cough so stopped her Lisinopril.   Changed her to Maxzide daily.  RTC in 3 mos to recheck BP and get a BMP. The following medications were removed from the medication list:    Lisinopril 10 Mg Tabs (Lisinopril) .Marland Kitchen... 1 tab by mouth daily Her updated medication list for  this problem includes:    Maxzide-25 37.5-25 Mg Tabs (Triamterene-hctz) .Marland Kitchen... 1 tab by mouth daily  BP today: 144/94 Prior BP: 136/77 (03/09/2009)  Labs Reviewed: K+: 5.0 (03/09/2009) Creat: : 0.81 (03/09/2009)   Chol: 190 (03/09/2009)   HDL: 40 (03/09/2009)   LDL: 124 (03/09/2009)   TG: 129 (03/09/2009)  Problem # 3:  ACUTE SINUSITIS, UNSPECIFIED (ICD-461.9) 1 month of head congestion and hx of sinus polyps. Treat with Amoxicillin. Avoid smoking.   Her updated medication list for this problem includes:    Amoxicillin 500 Mg Caps (Amoxicillin) .Marland Kitchen... 1 capsule by mouth three times a day x 10 days  Problem # 4:  IRREGULAR MENSES (ICD-626.4) Referral made for gyn down the hall.  She may be a good candidate for uterine ablation.  She is a smoker so not a candidate for OCPs.   Orders: Gynecologic Referral (Gyn)  Complete Medication List: 1)  Topamax 100  Mg Tabs (Topiramate) .... Take 1 tab by mouth two times a day 2)  Diazepam 10 Mg Tabs (Diazepam) .Marland Kitchen.. 1 tab by mouth at bedtime as needed muscle spasm 3)  Alprazolam 1 Mg Tabs (Alprazolam) .Marland Kitchen.. 1 tab by mouth qam as needed anxiety 4)  Vicodin 5-500 Mg Tabs (Hydrocodone-acetaminophen) .Marland Kitchen.. 1 tab by mouth q 8 hrs as needed severe pain 5)  Maxzide-25 37.5-25 Mg Tabs (Triamterene-hctz) .Marland Kitchen.. 1 tab by mouth daily 6)  Amoxicillin 500 Mg Caps (Amoxicillin) .Marland Kitchen.. 1 capsule by mouth three times a day x 10 days  Patient Instructions: 1)  F/U with Dr Izola Price (neuro) and opthamology. 2)  Referral made to Dr Jearld Lesch down the hall for irregular periods. 3)  Return for f/u elevated blood pressure in 3 months. Prescriptions: AMOXICILLIN 500 MG CAPS (AMOXICILLIN) 1 capsule by mouth three times a day x 10 days  #30 x 0   Entered and Authorized by:   Seymour Bars DO   Signed by:   Seymour Bars DO on 06/01/2009   Method used:   Electronically to        [facility] [phone]* (retail)       [address] [place], [place]  [address]       Ph: [phone]       Fax: [phone]   RxID:   [id number] MAXZIDE-25 37.5-25 MG TABS (TRIAMTERENE-HCTZ) 1 tab by mouth daily  #30 x 2   Entered and Authorized by:   Seymour Bars DO   Signed by:   Seymour Bars DO on 06/01/2009   Method used:   Electronically to        [facility] [phone]* (retail)       [address] [place], [place]  [address]       Ph: [phone]       Fax: [phone]   RxID:   [id number] VICODIN 5-500 MG TABS (HYDROCODONE-ACETAMINOPHEN) 1 tab by mouth q 8 hrs as needed severe pain  #40 x 0   Entered and Authorized by:   Seymour Bars DO   Signed by:   Seymour Bars DO on 06/01/2009   Method used:   Printed then faxed to ...       [facility] [phone]* (retail)       [address] [place], [place]  [address]       Ph: [phone]  Fax: [phone]   RxID:   [id number] TOPAMAX 100 MG TABS (TOPIRAMATE) Take 1 tab  by mouth two times a day  #60 x 3   Entered and Authorized by:   Seymour Bars DO   Signed by:   Seymour Bars DO on 06/01/2009   Method used:   Printed then faxed to ...       [facility] [phone]* (retail)       [address] [place], [place]  [address]       Ph: [phone]       Fax: [phone]   RxID:   [id number] ALPRAZOLAM 1 MG TABS (ALPRAZOLAM) 1 tab by mouth qAM as needed anxiety  #30 x 0   Entered and Authorized by:   Seymour Bars DO   Signed by:   Seymour Bars DO on 06/01/2009   Method used:   Printed then faxed to ...       [facility] [phone]* (retail)       [address] [place], [place]  [address]       Ph: [phone]       Fax: [phone]   RxID:   [id number] DIAZEPAM 10 MG TABS (DIAZEPAM) 1 tab by mouth at bedtime as needed muscle spasm  #30 x 0   Entered and Authorized by:   Seymour Bars DO   Signed by:   Seymour Bars DO on 06/01/2009   Method used:   Printed then faxed to ...       [facility] [phone]* (retail)       [address] [place], [place]  [address]       Ph: [phone]       Fax: [phone]   RxID:   [id number]

## 2010-06-14 NOTE — Letter (Signed)
Summary: Primary Care Consult Scheduled Letter  [facility] [place]  [address]   [place], [place] [address]   Phone: [phone]  Fax: [phone]      01/26/2010 MRN: [id number]  Ashley Gomez [address] [place], [place]  [address]    Dear Ms. Bigford,      We have scheduled an appointment for you.  At the recommendation of Dr.__Bowen____________________, we have scheduled you a consult with __Kathy Lindner__________________ on _9/29/2011_________________ at [facility]_____.  Their address is_[address]________. The office phone number is _[phone]___________.  If this appointment day and time is not convenient for you, please feel free to call the office of the doctor you are being referred to at the number listed above and reschedule the appointment.     It is important for you to keep your scheduled appointments. We are here to make sure you are given good patient care. If you have questions or you have made changes to your appointment, please notify us at  [phone]        , ask for   [facility]           .    Thank you,  Patient Care Coordinator [facility] [place]

## 2010-06-14 NOTE — Progress Notes (Signed)
Summary: PT/INR results  Phone Note From Other Clinic   Caller: Nurse [facility] Summary of Call: Dr. Theodoro Grist nurse called back stating Dr. Tyrell Antonio ordered labs on Pt yesterday and included PT/INR. Dr. Tyrell Antonio is not going to manage coumadin but included it so you would have the results for Pt.  PT 18.8 and INR 1.8  Pt would like to know if she needs U/S to see if clot is gone. Please advise. Initial call taken by: Payton Spark CMA,  January 19, 2010 11:10 AM    New/Updated Medications: COUMADIN 5 MG TABS (WARFARIN SODIUM) Sunday - 5 mg, Monday - 6 mg, Tuesday - 5 mg, Wednesday - 5 mg, Thursday - 5 mg, Friday - 6 mg, Saturday - 5 mg COUMADIN 1 MG TABS (WARFARIN SODIUM) Sunday - 5 mg, Monday - 6 mg, Tuesday - 5 mg, Wednesday - 5 mg, Thursday - 5 mg, Friday - 6 mg, Saturday - 5 mg Prescriptions: COUMADIN 1 MG TABS (WARFARIN SODIUM) Sunday - 5 mg, Monday - 6 mg, Tuesday - 5 mg, Wednesday - 5 mg, Thursday - 5 mg, Friday - 6 mg, Saturday - 5 mg  #30 x 0   Entered and Authorized by:   Breawna Bowen DO   Signed by:   Lujean Bowen DO on 01/19/2010   Method used:   Electronically to        [facility]  [id number]* (retail)       [address]       [place], [place]  [address]       Ph: [phone]       Fax: [phone]   RxID:   [id number] COUMADIN 5 MG TABS (WARFARIN SODIUM) Sunday - 5 mg, Monday - 6 mg, Tuesday - 5 mg, Wednesday - 5 mg, Thursday - 5 mg, Friday - 6 mg, Saturday - 5 mg  #30 x 2   Entered and Authorized by:   Keva Bowen DO   Signed by:   Noell Bowen DO on 01/19/2010   Method used:   Electronically to        [facility]  [id number]* (retail)       [address], [place]  [address]       Ph: [phone]       Fax: [phone]   RxID:   [id number]   Laboratory Results   Blood Tests     PT: 18.8 s   (Normal Range: 10.6-13.4)  INR: 1.8   (Normal Range: 0.88-1.12   Therap INR: 2.0-3.5)      Anticoagulation Management History:      Her anticoagulation  is being managed by telephone today.  She is being anticoagulated because of the first episode of deep venous thrombosis and/or pulmonary embolism.  Anticipated length of treatment is 3-6 months.  Her last INR was 1.5 and today's INR is 1.8.    Anticoagulation Management Assessment/Plan:      The target INR is 2.0-3.0.  She is to have a PT/INR in 1 week.         Current Anticoagulation Instructions: The patient's dosage of coumadin will be increased.  The new dosage includes:   Coumadin 5 mg tabs and Coumadin 1 mg tabs:  Sunday - 5 mg, Monday - 6 mg, Tuesday - 5 mg, Wednesday - 5 mg, Thursday - 5 mg, Friday - 6 mg, Saturday - 5 mg.    Repeat PT/INR  in 1 week.    Appended Document: PT/INR results Pt aware of the above

## 2010-06-14 NOTE — Assessment & Plan Note (Signed)
Summary: MS    Vital Signs:  Patient profile:   40 year old female Menstrual status:  regular Height:      66 inches Weight:      262 pounds BMI:     42.44 O2 Sat:      97 % on Room air Temp:     97.6 degrees F oral Pulse rate:   108 / minute BP sitting:   146 / 88  (left arm) Cuff size:   large  Vitals Entered By: Payton Spark CMA (July 13, 2009 4:24 PM)  O2 Flow:  Room air CC: Hip and leg pain. Had 3 falls in 5 days.  Pain Assessment Patient in pain? yes      Intensity: 10   Primary Care Provider:  Seymour Bars DO  CC:  Hip and leg pain. Had 3 falls in 5 days. Marland Kitchen  History of Present Illness: 40 yo WF presents for problems with bilateral hip pain that started about 2 wks ago.  She fell 3 x in 5 days.  She went to [facility] ED but says that this was for migraines and not for falls/ MS.  She has yet to see the neurologist for MS b/c of 'money'.  She thinks that she cannot work due to pain and weakness.    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  Her symptoms are affecting her job.  She has some paresthesias in the L hand on and off.  She is getting edema in both hands and feet.  No functional loss of hands.  She is R handed.  Her hx is complicated by financial stressors and 'failing' multiple other meds and chronic sciatica.    Current Medications (verified): 1)  Topamax 100 Mg Tabs (Topiramate) .... Take 1 Tab By Mouth Two Times A Day 2)  Diazepam 10 Mg Tabs (Diazepam) .Marland Kitchen.. 1 Tab By Mouth At Bedtime As Needed Muscle Spasm 3)  Alprazolam 1 Mg Tabs (Alprazolam) .Marland Kitchen.. 1 Tab By Mouth Qam As Needed Anxiety 4)  Vicodin 5-500 Mg Tabs (Hydrocodone-Acetaminophen) .Marland Kitchen.. 1 Tab By Mouth Q 8 Hrs As Needed Severe Pain 5)  Maxzide-25 37.5-25 Mg Tabs (Triamterene-Hctz) .Marland Kitchen.. 1 Tab By Mouth Daily  Allergies (verified): 1)  Ace Inhibitors  Past History:  Past Medical History: Reviewed history from 06/17/2009 and no changes required. MS diagnosed at age  32 HTN obesity smoking Depression/ Anxiety Migraines Lumbar facet hypertrophy; MRI 07  Neuro appt is next wk.  Social History: Reviewed history from 03/09/2009 and no changes required. 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   Occas ETOH. 5+ coffee/ day. 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  does some walking. Poor diet. Weight stable x 3 yrs.  Review of Systems      See HPI  Physical Exam  General:  alert, well-developed, well-nourished, and well-hydrated.  obese in NAD Lungs:  Normal respiratory effort, chest expands symmetrically. Lungs are clear to auscultation, no crackles or wheezes. Heart:  Normal rate and regular rhythm. S1 and S2 normal without gallop, murmur, click, rub or other extra sounds. Msk:  no joint swelling, no joint warmth, and no redness over joints.   Extremities:  trace LE edema Neurologic:  L>R LE weakness with resisting - hip flexion and leg extension.  Skin:  color normal.   Psych:  good eye contact, not anxious appearing, and flat affect.     Impression & Recommendations:  Problem # 1:  MULTIPLE SCLEROSIS,  RELAPSING/REMITTING (ICD-340)  She appears to be having a flare of her MS with pain and weakness, falls. I reminded her once again to r/s her appt with Dr Lenise Arena that was set up a few mos ago. She is resistant to trying neurontin or lyrica. Will give Toradol injection for pain today and start her on Prednisone dose pak and Vicodin for pain -- this is short term only.    Orders: Ketorolac-Toradol 15mg  (Y4034) Admin of Therapeutic Inj  intramuscular or subcutaneous ([address])  Complete Medication List: 1)  Topamax 100 Mg Tabs (Topiramate) .... Take 1 tab by mouth two times a day 2)  Diazepam 10 Mg Tabs (Diazepam) .Marland Kitchen.. 1 tab by mouth at bedtime as needed muscle spasm 3)  Alprazolam 1 Mg Tabs (Alprazolam) .Marland Kitchen.. 1 tab by mouth qam as needed anxiety 4)  Vicodin 5-500 Mg Tabs (Hydrocodone-acetaminophen) .Marland Kitchen.. 1-2 tabs by mouth q 8 hrs as needed  severe pain 5)  Maxzide-25 37.5-25 Mg Tabs (Triamterene-hctz) .Marland Kitchen.. 1 tab by mouth daily 6)  Prednisone (pak) 10 Mg Tabs (Prednisone) .... Take for 12 days  Patient Instructions: 1)  Toradol injection today. 2)  Use Vicodin for severe pain. 3)  Use Diazepam at bedtime.  10 mg is the max dose. 4)  Be sure to make appt with Dr Lenise Arena. 5)  I will not be RFing your pain meds. Prescriptions: PREDNISONE (PAK) 10 MG TABS (PREDNISONE) take for 12 days  #1 x 0   Entered and Authorized by:   Seymour Bars DO   Signed by:   Seymour Bars DO on 07/13/2009   Method used:   Electronically to        [facility] [phone]* (retail)       [address] [place], [place]  [address]       Ph: [phone]       Fax: [phone]   RxID:   [id number] VICODIN 5-500 MG TABS (HYDROCODONE-ACETAMINOPHEN) 1-2 tabs by mouth q 8 hrs as needed severe pain  #60 x 0   Entered and Authorized by:   Seymour Bars DO   Signed by:   Seymour Bars DO on 07/13/2009   Method used:   Printed then faxed to ...       [facility] [phone]* (retail)       [address] [place], [place]  [address]       Ph: [phone]       Fax: [phone]   RxID:   [id number]    Medication Administration  Injection # 1:    Medication: Ketorolac-Toradol 15mg     Diagnosis: MULTIPLE SCLEROSIS, RELAPSING/REMITTING (ICD-340)    Route: IM    Site: RUOQ gluteus    Exp Date: 12/14/2010    Lot #: [id number]    Comments: 60 mg    Patient tolerated injection without complications    Given by: Payton Spark CMA (July 14, 2009 8:23 AM)  Orders Added: 1)  Est. Patient Level III [93716] 2)  Ketorolac-Toradol 15mg  [J1885] 3)  Admin of Therapeutic Inj  intramuscular or subcutaneous [96789]

## 2010-06-14 NOTE — Letter (Signed)
Summary: Generic Letter  [facility] [place]  [address]   [place], [place] [address]   Phone: [phone]  Fax: [phone]    04/04/2010  Ashley Gomez [address] [place], [place]  [address]  To Whom It May Concern,  [name] is medically cleared to return to work full - time.         Sincerely,    Seymour Bars DO

## 2010-06-14 NOTE — Assessment & Plan Note (Signed)
Summary: migraines   Vital Signs:  Patient profile:   40 year old female Menstrual status:  regular Height:      66 inches Weight:      257.25 pounds Pulse rate:   91 / minute BP sitting:   164 / 94  Vitals Entered By: Kandice Hams (June 17, 2009 1:36 PM) CC: c/o  migraine headaches x 1 week, went to [facility]   Primary Care Provider:  Seymour Bars DO  CC:  c/o  migraine headaches x 1 week and went to [facility].  History of Present Illness: 40 yo WF presents for a migraine that began 7 days ago.  She has problems with loss of peripheral vision and numbness in her LUE.  She went to the ED last Friday.  She was given compazine, benadryl and 2  other drugs by IV which helped some.  The HA came back and has been worse.  She has not been able to sleep well or work.  She used Hydrocodone 2 tabs q 5 hrs which has not worked.  She has taken Valium which helped just a little.  She has some mild nausea and photophobia.    She has an initial appt with neurology for MS next wk.      Allergies: 1)  Ace Inhibitors  Past History:  Past Medical History: MS diagnosed at age 20 HTN obesity smoking Depression/ Anxiety Migraines Lumbar facet hypertrophy; MRI 07  Neuro appt is next wk.  Social History: Reviewed history from 03/09/2009 and no changes required. 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   Occas ETOH. 5+ coffee/ day. 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  does some walking. Poor diet. Weight stable x 3 yrs.  Review of Systems      See HPI  Physical Exam  General:  alert, well-developed, well-nourished, and well-hydrated.  obese in NAD Head:  normocephalic and atraumatic.   Eyes:  photosensitive.  grossly normal fundoscopic exam Mouth:  pharynx pink and moist.   Neck:  no masses.   Lungs:  Normal respiratory effort, chest expands symmetrically. Lungs are clear to auscultation, no crackles or wheezes. Heart:  Normal rate and regular rhythm. S1  and S2 normal without gallop, murmur, click, rub or other extra sounds. Extremities:  no LE edema Neurologic:  no tremor grip + 5/5 Skin:  color normal.   Cervical Nodes:  No lymphadenopathy noted Psych:  flat affect.     Impression & Recommendations:  Problem # 1:  MIGRAINE WITHOUT AURA (ICD-346.10)  Intractable migraine x 7 days. Treat w/ Toradol 60 mg IM x 1 followed by a Prednisone taper 80-60-40-20-10 stop. Has neuro f/u next wk.  Avoid migraine triggers.   Her updated medication list for this problem includes:    Vicodin 5-500 Mg Tabs (Hydrocodone-acetaminophen) .Marland Kitchen... 1 tab by mouth q 8 hrs as needed severe pain  Orders: Ketorolac-Toradol 15mg  (Z6109)  Complete Medication List: 1)  Topamax 100 Mg Tabs (Topiramate) .... Take 1 tab by mouth two times a day 2)  Diazepam 10 Mg Tabs (Diazepam) .Marland Kitchen.. 1 tab by mouth at bedtime as needed muscle spasm 3)  Alprazolam 1 Mg Tabs (Alprazolam) .Marland Kitchen.. 1 tab by mouth qam as needed anxiety 4)  Vicodin 5-500 Mg Tabs (Hydrocodone-acetaminophen) .Marland Kitchen.. 1 tab by mouth q 8 hrs as needed severe pain 5)  Maxzide-25 37.5-25 Mg Tabs (Triamterene-hctz) .Marland Kitchen.. 1 tab by mouth daily 6)  Prednisone 20 Mg Tabs (Prednisone) .... 4  tabs by mouth x 1 day then 3 tabs by mouth x 1 day then 2 tabs by mouth x 1 day then 1 tab by mouth x 1 day then 1/2 tab by mouth x 1 day  Patient Instructions: 1)  Toradol injection today. 2)  Start Prednisone taper tomorrow 80-60-40-20-10 stop. 3)  F/U with Neurology next wk for MS and Migraines. Prescriptions: VICODIN 5-500 MG TABS (HYDROCODONE-ACETAMINOPHEN) 1 tab by mouth q 8 hrs as needed severe pain  #40 x 0   Entered and Authorized by:   Seymour Bars DO   Signed by:   Seymour Bars DO on 06/17/2009   Method used:   Printed then faxed to ...       [facility] [phone]* (retail)       [address] [place], [place]  [address]       Ph: [phone]       Fax: [phone]   RxID:   [id number] PREDNISONE 20 MG TABS  (PREDNISONE) 4 tabs by mouth x 1 day then 3 tabs by mouth x 1 day then 2 tabs by mouth x 1 day then 1 tab by mouth x 1 day then 1/2 tab by mouth x 1 day  #11  tabs x 0   Entered and Authorized by:   Seymour Bars DO   Signed by:   Seymour Bars DO on 06/17/2009   Method used:   Electronically to        [facility] [phone]* (retail)       [address] [place], [place]  [address]       Ph: [phone]       Fax: [phone]   RxID:   [id number]    Medication Administration  Injection # 1:    Medication: Ketorolac-Toradol 15mg     Diagnosis: MIGRAINE WITHOUT AURA (ICD-346.10)    Route: IM    Site: RUOQ gluteus    Exp Date: 01/16/2011    Lot #: [id number]    Mfr: [facility]    Comments: 60 mg given    Patient tolerated injection without complications    Given by: Kandice Hams (June 17, 2009 3:09 PM)  Orders Added: 1)  Est. Patient Level III [99213] 2)  Ketorolac-Toradol 15mg  [U9323]

## 2010-06-14 NOTE — Progress Notes (Signed)
Summary: Thick bloody mucus from nose  Phone Note Call from Patient   Caller: Patient Summary of Call: Pt states she has had HA, nasal congestion and when blew nose this morning she noticed thick bloody mucus from both nostrils. Pt concerned bc she is on coumadin.  Initial call taken by: Payton Spark CMA,  February 14, 2010 9:06 AM  Follow-up for Phone Call        see if she can come in for INR today. Follow-up by: Seymour Bars DO,  February 14, 2010 9:22 AM     Appended Document: Thick bloody mucus from nose Pt scheduled for today

## 2010-06-14 NOTE — Progress Notes (Signed)
Summary: Referral  Phone Note Call from Patient Call back at Home Phone [phone]   Caller: Patient Call For: Ashley Bars DO Summary of Call: Patient states she gets paid on March 21st, is requesting a new referral for Dr Cyril Loosen after the 21st so she will be able to afford the copay. Initial call taken by: Lannette Donath,  July 20, 2009 2:16 PM  Follow-up for Phone Call        pt can call to get an appt.  a referral was already put in. Follow-up by: Ashley Bars DO,  July 20, 2009 2:21 PM  Additional Follow-up for Phone Call Additional follow up Details #1::        [facility] informing Pt of the above

## 2010-06-14 NOTE — Assessment & Plan Note (Signed)
Summary: abd pain, LBP, diarrhea, x 2 weeks//mpm   Vital Signs:  Patient profile:   40 year old female Menstrual status:  regular Height:      66 inches Weight:      242 pounds O2 Sat:      97 % Temp:     97.8 degrees F BP sitting:   138 / 84  (right arm) Cuff size:   regular  Vitals Entered By: Avon Gully CMA, Duncan Dull) (March 10, 2010 2:18 PM) CC: diarrhea x 10 days,abd pain,lower back pain   Primary Care Provider:  Seymour Bars DO  CC:  diarrhea x 10 days, abd pain, and lower back pain.  History of Present Illness: diarrhea x 10 days, abd pain, lower back pain. Has lost 8 lbs in the last couple of weeks. Having 4-5 BMSa day.  No fever.  At last appt seen for coumadin and her dose were inc about 2 weeks ago, for DVT prophylaxi.  Then diarrhea started about 8-10days. Feels nauseated.  Using immodium 3-4 a day.  Stools look thin, yellow, blood. No mucous or flecks.  Says she is not drinking alot and feelig dizziness.  HAs had about 2-3 dizzy spells. Had a cough when saw r. B but still has cough. Cough is worse at night.  Cough is dry.  NO SOB.  Sleeping with a cough drop in her mouth.  BAck pain started around the same time. Has LUQ pain that about a week ago, thinks related to the cough.  cough started bout 2 weeks ago. No hx of PNA or asthma.   Anticoagulation Management History:      The patient is on coumadin and comes in today for a routine follow up visit.  She is being anticoagulated because of the first episode of deep venous thrombosis and/or pulmonary embolism.  Anticipated length of treatment is 3-6 months.  Her last INR was 2.1 and today's INR is 4.0.    Current Medications (verified): 1)  Topamax 100 Mg Tabs (Topiramate) .... Take 1 Tab By Mouth Two Times A Day 2)  Diazepam 10 Mg Tabs (Diazepam) .Marland Kitchen.. 1 Tab By Mouth At Bedtime As Needed Muscle Spasm 3)  Alprazolam 1 Mg Tabs (Alprazolam) .Marland Kitchen.. 1 Tab By Mouth Qam As Needed Anxiety 4)  Coumadin 6 Mg Tabs (Warfarin Sodium)  .... Sunday - 6 Mg, Monday - 8 Mg, Tuesday - 6 Mg, Wednesday - 8 Mg, Thursday - 6 Mg, Friday - 8 Mg, Saturday - 6 Mg 5)  Coumadin 1 Mg Tabs (Warfarin Sodium) .... Sunday - 6 Mg, Monday - 8 Mg, Tuesday - 6 Mg, Wednesday - 8 Mg, Thursday - 6 Mg, Friday - 8 Mg, Saturday - 6 Mg 6)  Tussicaps 10-8 Mg Xr12h-Cap (Hydrocod Polst-Chlorphen Polst) .Marland Kitchen.. 1 Capsule At Bedtime As Needed For Cough  Allergies (verified): 1)  Ace Inhibitors  Comments:  Nurse/Medical Assistant: The patient's medications and allergies were reviewed with the patient and were updated in the Medication and Allergy Lists. Avon Gully CMA, Duncan Dull) (March 10, 2010 2:19 PM)  Past History:  Past Medical History: Last updated: 02/28/2010 ? MS --  HTN obesity smoking Depression/ Anxiety Migraines Lumbar facet hypertrophy; MRI 07 Old CVA on MRI brain L basilic vein  DVT 12-2009 seizures  Neuro Dr Mickie Kay. heme Dr Abbe Amsterdam  Past Surgical History: Last updated: 03/09/2009 BTL 1992 Tonsillectomy  Family History: Last updated: 03/09/2009 identical twin sister -- HTN, valve disease.  diabetes, depression. 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   Occas ETOH. 5+ coffee/ day. 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  does some walking. Poor diet. Weight stable x 3 yrs.  Physical Exam  General:  Well-developed,well-nourished,in no acute distress; alert,appropriate and cooperative throughout examination Head:  Normocephalic and atraumatic without obvious abnormalities. No apparent alopecia or balding. Eyes:  No corneal or conjunctival inflammation noted. EOMI. Perrla. Mouth:  Oral mucosa and oropharynx without lesions or exudates.  Teeth in good repair. Neck:  No deformities, masses, or tenderness noted. Lungs:  Normal respiratory effort, chest expands symmetrically. Lungs are clear to  auscultation, no crackles or wheezes. Heart:  Normal rate and regular rhythm. S1 and S2 normal without gallop, murmur, click, rub or other extra sounds. Abdomen:  soft and normal bowel sounds.  TEnder over the RUQ, LUQ, and epigastrum.  Msk:  She is tender over teh lumbar spine and paraspinous muscles bilat. No SI joint tenderness.  Hip, knee and ankle strength 5/5 bilat  Neurologic:  Patellar reflexes 2+ bialt.  Skin:  no rashes.   Psych:  Cognition and judgment appear intact. Alert and cooperative with normal attention span and concentration. No apparent delusions, illusions, hallucinations   Impression & Recommendations:  Problem # 1:  DIARRHEA (ICD-787.91) Unclear etiology. She is tender over the right and left upper quads, thought she c/o LUQ pain.  Consider colitis vs pancreatitis vs hepatitis vs foods poisoingin. Will start with labs. She has not seen any blood in her stool which is good considering she is supratheapeutic.  Cont immodium as needed.  Orders: T-TSH [phone]) T-CBC w/Diff [phone]) T-Amylase [phone]) T-Lipase [phone]) T-Stool Culture (43329)  Problem # 2:  COUGH (ICD-786.2) ONly had for 2 weeks but in combo with her other sxs will get a CXR. She is a smoker.  Consider GERD or allergeis.  Orders: T-Chest x-ray, 2 views (71020)  Problem # 3:  DVT (ICD-453.40) Suspect her INR is elevated as she hasn't been eating and she is dehydrated.  Adjsuted dose. Recheck in one week.   Problem # 4:  LUMBAGO (ICD-724.2) May be related to the abdominal pain and diarrhe or may be MSK but want to avoid NSAIDs at this time. Will await lab results.   Complete Medication List: 1)  Topamax 100 Mg Tabs (Topiramate) .... Take 1 tab by mouth two times a day 2)  Diazepam 10 Mg Tabs (Diazepam) .Marland Kitchen.. 1 tab by mouth at bedtime as needed muscle spasm 3)  Alprazolam 1 Mg Tabs (Alprazolam) .Marland Kitchen.. 1 tab by mouth qam as needed anxiety 4)  Tussicaps 10-8 Mg Xr12h-cap (Hydrocod  polst-chlorphen polst) .Marland Kitchen.. 1 capsule at bedtime as needed for cough 5)  Coumadin 6 Mg Tabs (Warfarin sodium) .... Sunday - 6 mg, monday - 8 mg, tuesday - 6 mg, wednesday - 6 mg, thursday - 6 mg, friday - 8 mg, saturday - 6 mg 6)  Coumadin 1 Mg Tabs (Warfarin sodium) .... Sunday - 6 mg, monday - 8 mg, tuesday - 6 mg, wednesday - 6 mg, thursday - 6 mg, friday - 8 mg, saturday - 6 mg  Other Orders: Fingerstick (51884) Protime INR (16606) T-Comprehensive Metabolic Panel ([id number])  Anticoagulation Management Assessment/Plan:      The target INR is 2.0-3.0.  She is to have a PT/INR in 1 week.  Anticoagulation instructions were given to patient.  Current Anticoagulation Instructions: The patient is to hold (do not take) the Thursday dose of coumadin.  The dosage to be resumed includes: Dec Friday dose to 6mg  and then as follows. Coumadin 6 mg tabs and Coumadin 1 mg tabs:  Sunday - 6 mg, Monday - 8 mg, Tuesday - 6 mg, Wednesday - 6 mg, Thursday - 6 mg, Friday - 8 mg, Saturday - 6 mg.  Repeat PT/INR in 1 week.    Patient Instructions: 1)  Hold coumadin dose for today and take 6mg  tomorrow and then regular regimen but change Wednesday to 6mg  daily.   2)  We will call you with the lab and chest xray results.    Orders Added: 1)  Fingerstick [36416] 2)  Protime INR [85610] 3)  T-Chest x-ray, 2 views [71020] 4)  T-Comprehensive Metabolic Panel [[id number]] 5)  T-TSH [[id number]] 6)  T-CBC w/Diff [[id number]] 7)  T-Amylase [82150-23210] 8)  T-Lipase [83690-23215] 9)  T-Stool Culture [87045] 10)  Est. Patient Level IV [29562]    Laboratory Results   Blood Tests   Date/Time Received: 03/10/10 Date/Time Reported: 03/10/10   INR: 4.0   (Normal Range: 0.88-1.12   Therap INR: 2.0-3.5)

## 2010-06-14 NOTE — Assessment & Plan Note (Signed)
Summary: nurse visit - per Michelle-jr  Nurse Visit   Vitals Entered By: Payton Spark CMA (March 14, 2010 2:31 PM)  Anticoagulation Management History:      She is being anticoagulated because of the first episode of deep venous thrombosis and/or pulmonary embolism.  Anticipated length of treatment is 3-6 months.  Her last INR was 4.0 and today's INR is 2.1.     Allergies: 1)  Ace Inhibitors Laboratory Results   Blood Tests      INR: 2.1   (Normal Range: 0.88-1.12   Therap INR: 2.0-3.5)    Orders Added: 1)  Fingerstick [36416] 2)  Protime INR [85610] Prescriptions: COUMADIN 3 MG TABS (WARFARIN SODIUM) Sunday - 3 mg, Monday - 3 mg, Tuesday - 3 mg, Wednesday - 3 mg, Thursday - 3 mg, Friday - 3 mg, Saturday - 3 mg  #30 x 0   Entered and Authorized by:   Corsica Bowen DO   Signed by:   Sheryn Bowen DO on 03/14/2010   Method used:   Electronically to        [facility]  [id number]* (retail)       [address] [place]  [address]       Ph: [phone]       Fax: [phone]   RxID:   [id number]    Anticoagulation Management Assessment/Plan:      The target INR is 2.0-3.0.  She is to have a PT/INR in 1 week.  Anticoagulation instructions were given to patient.         Current Anticoagulation Instructions: The patient's dosage of coumadin will be decreased.  The new dosage includes:   Coumadin 6 mg tabs and Coumadin 1 mg tabs and Coumadin 3 mg tabs:  Sunday - 6 mg, Monday - 8 mg, Tuesday - 6 mg, Wednesday - 6 mg, Thursday - 6 mg, Friday - 8 mg, Saturday - 6 mg.    Repeat PT/INR in 1 week.

## 2010-06-14 NOTE — Progress Notes (Signed)
Summary: Diazepam and alprazolam refills  Phone Note Refill Request   Refills Requested: Medication #1:  DIAZEPAM 10 MG TABS 1 tab by mouth at bedtime as needed muscle spasm  Medication #2:  ALPRAZOLAM 1 MG TABS 1 tab by mouth qAM as needed anxiety Initial call taken by: Payton Spark CMA,  December 01, 2009 9:02 AM    Prescriptions: ALPRAZOLAM 1 MG TABS (ALPRAZOLAM) 1 tab by mouth qAM as needed anxiety  #30 x 0   Entered and Authorized by:   Seymour Bars DO   Signed by:   Seymour Bars DO on 12/01/2009   Method used:   Printed then faxed to ...       [facility] [phone]* (retail)       [address] [place], [place]  [address]       Ph: [phone]       Fax: [phone]   RxID:   [id number] DIAZEPAM 10 MG TABS (DIAZEPAM) 1 tab by mouth at bedtime as needed muscle spasm  #30 x 0   Entered and Authorized by:   Seymour Bars DO   Signed by:   Seymour Bars DO on 12/01/2009   Method used:   Printed then faxed to ...       [facility] [phone]* (retail)       [address] [place], [place]  [address]       Ph: [phone]       Fax: [phone]   RxID:   [id number]

## 2010-06-14 NOTE — Progress Notes (Signed)
Summary: call a nurse  [facility] Triage Call Report Triage Record Num: [id number] Operator: Jeraldine Loots Patient Name: Ashley Gomez Call Date & Time: 03/11/2010 5:23:00PM Patient Phone: [phone] PCP: Seymour Bars, DO Patient Gender: Female PCP Fax : [phone] Patient DOB: 07/02/1970 Practice Name: [facility] Reason for Call: Pt calling, on coumadin 6mg  qd except for Mon and Fri taking 8mg . Was given Flagyl (Dr. Glade Lloyd) for possible colitis and pharmacy advised her not to take since her INR is 4.0 on Thursday. Has lost 8# in 9 days. Paged Dr. Demaris Callander. Coumadin dose decrease to 3mg  daily and repeat INR on Monday. Pt advised and voiced an understanding. Protocol(s) Used: Office Note Recommended Outcome per Protocol: Information Noted and Sent to Office Reason for Outcome: Caller information to office Care Advice:  ~ 03/11/2010 5:34:12PM Page 1 of 1 [email or web address]  Appended Document: call a nurse [facility] informing Pt of the above and to CB to schedule NV today.

## 2010-06-14 NOTE — Consult Note (Signed)
Summary: [facility]  [facility]   Imported By: Lanelle Bal 08/23/2009 10:05:39  _____________________________________________________________________  External Attachment:    Type:   Image     Comment:   External Document

## 2010-06-14 NOTE — Miscellaneous (Signed)
Summary: old records  Clinical Lists Changes  Problems: Added new problem of DEPRESSIVE DISORDER NOT ELSEWHERE CLASSIFIED (ICD-311) Observations: Added new observation of PAST MED HX: MS diagnosed at age 40 HTN obesity smoking Depression/ Anxiety Migraines Lumbar facet hypertrophy; MRI 07 (05/16/2009 13:30) Added new observation of PRIMARY MD: Seymour Bars DO (05/16/2009 13:30) Added new observation of MAMMO DUE: 07/16/2008 (05/16/2009 13:30) Added new observation of PAP DUE: 08/12/2006 (05/16/2009 13:30) Added new observation of FLUVAXDUE: 03/09/2010 (05/16/2009 13:30) Added new observation of HDLNXTDUE: 03/09/2014 (05/16/2009 13:30) Added new observation of LDLNXTDUE: 03/09/2014 (05/16/2009 13:30) Added new observation of CREATNXTDUE: 03/09/2010 (05/16/2009 13:30) Added new observation of POTASSIUMDUE: 03/09/2010 (05/16/2009 13:30) Added new observation of LAST MAM DAT: 07/17/2007 (07/17/2007 13:32) Added new observation of MAMMOGRAM: normal (07/17/2007 13:32) Added new observation of LAST PAP DAT: 08/11/2004 (08/11/2004 13:31) Added new observation of PAP SMEAR: normal (08/11/2004 13:31)     PAP Result Date:  08/11/2004 PAP Result:  normal PAP Next Due:  2 yr Mammogram Result Date:  07/17/2007 Mammogram Result:  normal Mammogram Next Due:  1 yr      Past History:  Past Medical History: MS diagnosed at age 23 HTN obesity smoking Depression/ Anxiety Migraines Lumbar facet hypertrophy; MRI 07

## 2010-06-14 NOTE — Progress Notes (Signed)
Summary: Diazepam refills  Phone Note Refill Request   Refills Requested: Medication #1:  DIAZEPAM 10 MG TABS 1 tab by mouth at bedtime as needed muscle spasm Initial call taken by: Payton Spark CMA,  July 02, 2009 11:38 AM    Prescriptions: DIAZEPAM 10 MG TABS (DIAZEPAM) 1 tab by mouth at bedtime as needed muscle spasm  #30 x 0   Entered and Authorized by:   Seymour Bars DO   Signed by:   Seymour Bars DO on 07/02/2009   Method used:   Telephoned to ...       [facility] [phone]* (retail)       [address] [place], [place]  [address]       Ph: [phone]       Fax: [phone]   RxID:   [id number]   Appended Document: Diazepam refills Rx called in

## 2010-06-14 NOTE — Letter (Signed)
Summary: Internal Correspondence-CALL A NURSE REPORT  Internal Correspondence-CALL A NURSE REPORT   Imported By: Vanessa Swaziland 06/14/2009 09:42:43  _____________________________________________________________________  External Attachment:    Type:   Image     Comment:   INTERNAL Document

## 2010-06-14 NOTE — Progress Notes (Signed)
Summary: Workers Comp- return to work  Advice worker from Patient Call back at [employer] [phone]   Caller: Patient Call For: Seymour Bars DO Summary of Call: Pt calls and states her INR is up and down and wants to know if can go back to work if safe. If so fax note to [phone] Attn: SHirley. Ortho has released her to return to work in regards to her knee. Pt has appt Monday and can pick up then Initial call taken by: Kathlene November LPN,  March 31, 2010 2:16 PM  Follow-up for Phone Call        letter to return to work faxed today. Follow-up by: Seymour Bars DO,  April 04, 2010 11:27 AM     Appended Document: Workers Comp- return to work 04/04/2010- Faxed note to work. KJ LPN

## 2010-06-14 NOTE — Progress Notes (Signed)
Summary: Alprazolam and Diazepam refills  Phone Note Refill Request   Refills Requested: Medication #1:  DIAZEPAM 10 MG TABS 1 tab by mouth at bedtime as needed muscle spasm  Medication #2:  ALPRAZOLAM 1 MG TABS 1 tab by mouth qAM as needed anxiety Initial call taken by: Payton Spark CMA,  January 28, 2010 3:39 PM    Prescriptions: DIAZEPAM 10 MG TABS (DIAZEPAM) 1 tab by mouth at bedtime as needed muscle spasm  #30 x 0   Entered and Authorized by:   Seymour Bars DO   Signed by:   Seymour Bars DO on 01/28/2010   Method used:   Printed then faxed to ...       [facility] [phone]* (retail)       [address] [place], [place]  [address]       Ph: [phone]       Fax: [phone]   RxID:   [id number] ALPRAZOLAM 1 MG TABS (ALPRAZOLAM) 1 tab by mouth qAM as needed anxiety  #30 x 0   Entered and Authorized by:   Seymour Bars DO   Signed by:   Seymour Bars DO on 01/28/2010   Method used:   Printed then faxed to ...       [facility] [phone]* (retail)       [address] [place], [place]  [address]       Ph: [phone]       Fax: [phone]   RxID:   [id number] COUMADIN 5 MG TABS (WARFARIN SODIUM) Sunday - 6 mg, Monday - 6 mg, Tuesday - 6 mg, Wednesday - 6 mg, Thursday - 6 mg, Friday - 6 mg, Saturday - 6 mg  #30 x 1   Entered by:   Michelle Mills CMA   Authorized by:   Tayja Nelson Julson DO   Signed by:   Michelle Mills CMA on 01/28/2010   Method used:   Electronically to        [facility]  [id number]* (retail)       [address]       [place], [place]  [address]       Ph: [phone]       Fax: [phone]   RxID:   [id number] COUMADIN 1 MG TABS (WARFARIN SODIUM) Sunday - 6 mg, Monday - 6 mg, Tuesday - 6 mg, Wednesday - 6 mg, Thursday - 6 mg, Friday - 6 mg, Saturday - 6 mg  #30 x 1   Entered by:   Michelle Mills CMA   Authorized by:   Tamika Breccan Galant DO   Signed by:   Michelle Mills CMA on 01/28/2010   Method used:   Electronically to        [facility]   [id number]* (retail)       [address] [place], [place]  [address]       Ph: [phone]       Fax: [phone]   RxID:   [id number]

## 2010-06-14 NOTE — Progress Notes (Signed)
Summary: ED f/u ?'s  Phone Note Call from Patient   Caller: Patient Summary of Call: Pt called to get an apt for ED f/u. Pt called call-a-nurse and was instructed to go to ED. Pt went to ED and treated for migraine. Pt states she lied to ED doc stating she felt better so she could leave the hospital. Pt tells me that she did not feel much better just wanted to go home. I scheduled Pt an apt for this Thurs at 1:15pm. Instructed Pt to stay on current meds until seen by Dr. Cathey Endow.  Pt agreed. Initial call taken by: Payton Spark CMA,  June 14, 2009 11:38 AM

## 2010-06-14 NOTE — Letter (Signed)
Summary: [facility]  [facility]   Imported By: Lanelle Bal 02/03/2010 10:23:25  _____________________________________________________________________  External Attachment:    Type:   Image     Comment:   External Document

## 2010-06-14 NOTE — Progress Notes (Signed)
Summary: Refills  Phone Note Refill Request   Refills Requested: Medication #1:  DIAZEPAM 10 MG TABS 1 tab by mouth at bedtime as needed muscle spasm  Medication #2:  ALPRAZOLAM 1 MG TABS 1 tab by mouth qAM as needed anxiety  Medication #3:  VICODIN 5-500 MG TABS 1-2 tabs by mouth q 8 hrs as needed severe pain Initial call taken by: Payton Spark CMA,  Sep 15, 2009 10:04 AM    Prescriptions: ALPRAZOLAM 1 MG TABS (ALPRAZOLAM) 1 tab by mouth qAM as needed anxiety  #30 x 0   Entered and Authorized by:   Seymour Bars DO   Signed by:   Seymour Bars DO on 09/15/2009   Method used:   Printed then faxed to ...       [facility] [phone]* (retail)       [address] [place], [place]  [address]       Ph: [phone]       Fax: [phone]   RxID:   [id number] DIAZEPAM 10 MG TABS (DIAZEPAM) 1 tab by mouth at bedtime as needed muscle spasm  #30 x 0   Entered and Authorized by:   Seymour Bars DO   Signed by:   Seymour Bars DO on 09/15/2009   Method used:   Printed then faxed to ...       [facility] [phone]* (retail)       [address] [place], [place]  [address]       Ph: [phone]       Fax: [phone]   RxID:   [id number]  As per last OV note, I am not continuing her narcotics.  I have given her plenty of oppurtunities to see neurology for her MS and Vicodin is not appropriate to continue.  Seymour Bars, D.O.  Appended Document: Refills LMOM informing Pt of the above

## 2010-06-14 NOTE — Assessment & Plan Note (Signed)
Summary: HFU- seizures, DVT   Vital Signs:  Patient profile:   40 year old female Menstrual status:  regular Height:      66 inches Weight:      243 pounds BMI:     39.36 O2 Sat:      97 % on Room air Temp:     97.8 degrees F oral Pulse rate:   108 / minute BP sitting:   136 / 84  (left arm) Cuff size:   large  Vitals Entered By: Payton Spark CMA (January 13, 2010 10:45 AM)  O2 Flow:  Room air CC: Hosp f/u.    Primary Care Provider:  Seymour Bars DO  CC:  Hosp f/u. Marland Kitchen  History of Present Illness: Ashley Gomez presents for HFU visit.  She was brought into [facility] last wk after sustaining a tonic clonic seizure.  She was started on IV Phenytoin and subsequently sustained a L basilic vein DVT.  During her w/u, she had findings of an old CVA.  She is set up to see Dr Mickie Kay on Tuesday.  He thinks that she does not have MS but rather findings c/w migraines on MRI.  She never had seizures prior to this event recently.  She is no longer driving.  She is here with her mom today.  She is only on Topamax as she was prior to her seizure.  She was started on Lovenox and Coumadin for her DVT on 8-28 and is doing well.  Due for INR today and has 4 more days of Lovenox.  She went back to the ED yesterday but I do not have these notes.  This was for redness and edema in the L foremarm with pain.  She was started on Keflex after a shot of Rocephin and Vicodinf or pain but this does not seem to be improving.  Denies fevers or chills.  Anticoagulation Management History:      The patient is on coumadin and comes in today for a routine follow up visit.  She is being anticoagulated due to the first episode of deep venous thrombosis and/or pulmonary embolism.  Anticipated length of treatment is 3-6 months.  Today's INR is 1.5.    Current Medications (verified): 1)  Topamax 100 Mg Tabs (Topiramate) .... Take 1 Tab By Mouth Two Times A Day 2)  Diazepam 10 Mg Tabs (Diazepam) .Marland Kitchen.. 1 Tab By Mouth At  Bedtime As Needed Muscle Spasm 3)  Alprazolam 1 Mg Tabs (Alprazolam) .Marland Kitchen.. 1 Tab By Mouth Qam As Needed Anxiety 4)  Maxzide-25 37.5-25 Mg Tabs (Triamterene-Hctz) .Marland Kitchen.. 1 Tab By Mouth Daily 5)  Voltaren 1 % Gel (Diclofenac Sodium) .... Use As Directed 6)  Lovenox 100 Mg/ml Soln (Enoxaparin Sodium) .... Take As Directed Two Times A Day X 7 Days. 7)  Coumadin 5 Mg Tabs (Warfarin Sodium) .... Take 1 Tab By Mouth Once Daily 8)  Keflex 500 Mg Caps (Cephalexin) .... Take 1 Tab By Mouth Four Times Daily X 7 Days  Allergies (verified): 1)  Ace Inhibitors  Past History:  Past Medical History: ? MS --  HTN obesity smoking Depression/ Anxiety Migraines Lumbar facet hypertrophy; MRI 07 Old CVA on MRI brain L arm DVT 12-2009 seizures  Neuro Dr Mickie Kay.  Past Surgical History: Reviewed history from 03/09/2009 and no changes required. BTL 1992 Tonsillectomy  Family History: Reviewed history from 03/09/2009 and no changes required. identical twin sister -- HTN, valve disease.  diabetes, depression. mom melanoma at  30s 2 half brothers  No MS in the family father unknown  Social History: Reviewed history from 03/09/2009 and no changes required. 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   Occas ETOH. 5+ coffee/ day. 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  does some walking. Poor diet. Weight stable x 3 yrs.  Review of Systems General:  Denies chills, fatigue, fever, malaise, and sleep disorder. Eyes:  Denies blurring and discharge. ENT:  Denies difficulty swallowing. CV:  Denies chest pain or discomfort, shortness of breath with exertion, and swelling of feet. GI:  Denies nausea and vomiting. Neuro:  Complains of seizures; denies headaches and poor balance; has not had any more seizures denies any use of ETOH.  Physical Exam  General:  alert, well-developed, well-nourished, well-hydrated, and overweight-appearing.  here with her mom Head:  normocephalic and atraumatic.     Eyes:  pupils equal, pupils round, and pupils reactive to light.   Mouth:  pharynx pink and moist.   Neck:  no masses.   Lungs:  Normal respiratory effort, chest expands symmetrically. Lungs are clear to auscultation, no crackles or wheezes. Heart:  Normal rate and regular rhythm. S1 and S2 normal without gallop, murmur, click, rub or other extra sounds. Pulses:  2+ bilat radial pulses Extremities:  soft tissue edema with redness and heat along the ventral side of the L forearm Neurologic:  no tremor; nonfocal neuro exam Psych:  good eye contact, not anxious appearing, and not depressed appearing.     Impression & Recommendations:  Problem # 1:  SEIZURE DISORDER (ICD-780.39) Assessment New Has not had any further seizure activity.  Off Phenytoin after this lead to a DVT.  She has f/u with Dr Lenise Arena on Tuesday.  She is to stay on Topamax.  She knows not to drive or drink ETOH.  Reviewed labs and MRI of the brain on ED records. Her updated medication list for this problem includes:    Topamax 100 Mg Tabs (Topiramate) .Marland Kitchen... Take 1 tab by mouth two times a day  Problem # 2:  DVT (ICD-453.40) Assessment: New L basilic DVT secondary to IV infusion of Phenytoin which was stopped.  INR 1.5 today after just 4 days of treatment.  Continue coumadin at 5 mg / day (may be affected by abx use).  She is to finish out her Lovenox (4 more days) and recheck INR Tuesday.  If she could be therapeutic x 48 hrs on coumadin before she stops her Lovenox, this would be perfect.    Concern for clotting d/o was raised during hosp visit.  She had findings of an old CVA on MRI of the brain but not other clots known.  She was reminded to quit smoking.  Will have Dr Lenise Arena review this MRI.  Plan to get ATIII, Factor V Leiden, Protein C and S to screen for hypercoagulable state. Orders: Fingerstick (38182) Protime INR (99371) T-Protime, Auto ([id number])  Problem # 3:  CELLULITIS AND ABSCESS OF UNSPECIFIED SITE  (ICD-682.9) L forearm cellulitis following IV infusion of Phenytoin which lead to a DVT.  She does not seem to be improving on Keflex.  Will change her to Bactrim DS if redness and edema have not improved by tomorrow.  Check CBC on Tuesday and will see her back in 2 wks. Her updated medication list for this problem includes:    Keflex 500 Mg Caps (Cephalexin) .Marland Kitchen... Take 1 tab by mouth four times daily x 7 days  Bactrim Ds 800-160 Mg Tabs (Sulfamethoxazole-trimethoprim) .Marland Kitchen... 1 tab by mouth two times a day x 10 days  Orders: T-CBC w/Diff ([id number])  Complete Medication List: 1)  Topamax 100 Mg Tabs (Topiramate) .... Take 1 tab by mouth two times a day 2)  Diazepam 10 Mg Tabs (Diazepam) .Marland Kitchen.. 1 tab by mouth at bedtime as needed muscle spasm 3)  Alprazolam 1 Mg Tabs (Alprazolam) .Marland Kitchen.. 1 tab by mouth qam as needed anxiety 4)  Maxzide-25 37.5-25 Mg Tabs (Triamterene-hctz) .Marland Kitchen.. 1 tab by mouth daily 5)  Voltaren 1 % Gel (Diclofenac sodium) .... Use as directed 6)  Lovenox 100 Mg/ml Soln (Enoxaparin sodium) .... Take as directed two times a day x 7 days. 7)  Coumadin 5 Mg Tabs (Warfarin sodium) .... Take 1 tab by mouth once daily 8)  Keflex 500 Mg Caps (Cephalexin) .... Take 1 tab by mouth four times daily x 7 days 9)  Bactrim Ds 800-160 Mg Tabs (Sulfamethoxazole-trimethoprim) .Marland Kitchen.. 1 tab by mouth two times a day x 10 days  Anticoagulation Management Assessment/Plan:      The target INR is 2.0-3.0.         Current Anticoagulation Instructions: The patient is to continue with the same dose of coumadin.  This dosage includes:   Coumadin 5 mg tabs: Take 1 tab by mouth once daily.    Recheck on Tuesday.  Patient Instructions: 1)  If L arm redness, swelling not improved in 24 hrs, change Keflex to Bactrim DS. 2)  Stay on Coumadin 5 mg/ day and use Lovenox until complete. 3)  Recheck labs downstairs on Tuesday. 4)  Will call you with adjustments. 5)  F/U with Dr Lenise Arena. 6)  Return to recheck  cellulitis/ DVT in 2 wks. Prescriptions: BACTRIM DS 800-160 MG TABS (SULFAMETHOXAZOLE-TRIMETHOPRIM) 1 tab by mouth two times a day x 10 days  #20 x 0   Entered and Authorized by:   Seymour Bars DO   Signed by:   Seymour Bars DO on 01/13/2010   Method used:   Printed then faxed to ...       [facility] [phone]* (retail)       [address] [place], [place]  [address]       Ph: [phone]       Fax: [phone]   RxID:   [id number]   Laboratory Results   Blood Tests      INR: 1.5   (Normal Range: 0.88-1.12   Therap INR: 2.0-3.5)

## 2010-06-14 NOTE — Consult Note (Signed)
Summary: [facility]   Imported By: Lanelle Bal 02/25/2010 10:30:13  _____________________________________________________________________  External Attachment:    Type:   Image     Comment:   External Document

## 2010-06-14 NOTE — Consult Note (Signed)
Summary: [facility]   Imported By: Lanelle Bal 04/19/2010 13:17:17  _____________________________________________________________________  External Attachment:    Type:   Image     Comment:   External Document

## 2010-06-14 NOTE — Letter (Signed)
Summary: [facility]  [facility]   Imported By: Lanelle Bal 02/03/2010 11:52:01  _____________________________________________________________________  External Attachment:    Type:   Image     Comment:   External Document

## 2010-06-14 NOTE — Progress Notes (Signed)
Summary: Maxide ?  Phone Note Call from Patient   Caller: Patient [phone] Summary of Call: Pt states she picked up Maxide from pharm and it had a FDA "black box" warning. Pt concerned that her constant HAs are the result of the maxide combined w/ all of her other meds. Pt states she takes daily topamax and still has daily HAs. Pt would like to know what you suggest and if she can dc the maxide. Initial call taken by: Payton Spark CMA,  February 03, 2010 9:32 AM  Follow-up for Phone Call        Stop the [facility] from today thru Sunday.  See if HA improves.  If it does not, have her f/u with Dr Daphane Shepherd for her migraines next wk.   Follow-up by: Seymour Bars DO,  February 03, 2010 9:48 AM  Additional Follow-up for Phone Call Additional follow up Details #1::        Pt aware of the above Additional Follow-up by: Payton Spark CMA,  February 03, 2010 9:59 AM

## 2010-06-14 NOTE — Progress Notes (Signed)
----   Converted from flag ---- ---- 07/21/2009 11:05 AM, Darral Dash wrote: Appt   Scheduled    for March  14  @ 2:15pm call to pt  she will call to resc for March 21   Helen  ---- 07/20/2009 3:27 PM, Payton Spark CMA wrote: Please reschedule this Pt w/ Dr. Izola Price. Pt waited too long to reschedule and they will not schedule w/out new referral. Thanks ------------------------------

## 2010-06-14 NOTE — Assessment & Plan Note (Signed)
Summary: L arm numbness/ INR   Vital Signs:  Patient profile:   40 year old female Menstrual status:  regular Height:      66 inches Weight:      246 pounds Pulse rate:   82 / minute BP sitting:   130 / 85  (left arm) Cuff size:   regular  Vitals Entered By: Kathlene November LPN (April 04, 2010 2:40 PM) CC: INR check.left sided chest numbness and left arm numbness- ? whether needs to go back to work or not   Primary Care [name]:  Seymour Bars DO  CC:  INR check.left sided chest numbness and left arm numbness- ? whether needs to go back to work or not.  History of Present Illness: 40 yo WF presents for INR check and L sided chest tingling and numbness into the L arm and L wrist.  This started about 2 wks ago.  This comes and goes and lasts for about 2 min at a time.  Denies any pain or chest pressure.  Denies any SOB or heart palpitations.  She is still smoking as has cut back.  She is on Topamax and is seeing Dr Mickie Kay.  She has had L hand tingling w/ her migraines in the past and has had a brain MRI done.  Denies any L arm weakness or neck pain.  Her DVT was in her LUE but this has not really bothered her lately.  She is on coumadin until Jan 1st.      Anticoagulation Management History:      The patient is on coumadin and comes in today for a routine follow up visit.  Coumadin therapy is being given due to the first episode of deep venous thrombosis and/or pulmonary embolism.  Anticipated length of treatment is 3-6 months.  Her last INR was 2.8 and today's INR is 1.2.    Current Medications (verified): 1)  Topamax 100 Mg Tabs (Topiramate) .... Take 1 Tab By Mouth Two Times A Day 2)  Diazepam 10 Mg Tabs (Diazepam) .Marland Kitchen.. 1 Tab By Mouth At Bedtime As Needed Muscle Spasm 3)  Alprazolam 1 Mg Tabs (Alprazolam) .Marland Kitchen.. 1 Tab By Mouth Qam As Needed Anxiety 4)  Tussicaps 10-8 Mg Xr12h-Cap (Hydrocod Polst-Chlorphen Polst) .Marland Kitchen.. 1 Capsule At Bedtime As Needed For Cough 5)  Metronidazole  500 Mg Tabs (Metronidazole) .... Take 1 Tablet By Mouth Three Times A Day For 10 Days 6)  Coumadin 3 Mg Tabs (Warfarin Sodium) .... Sunday - 3 Mg, Monday - 3 Mg, Tuesday - 3 Mg, Wednesday - 3 Mg, Thursday - 3 Mg, Friday - 3 Mg, Saturday - 3 Mg 7)  Coumadin 3 Mg Tabs (Warfarin Sodium) .... Sunday - 3 Mg, Monday - 3 Mg, Tuesday - 3 Mg, Wednesday - 3 Mg, Thursday - 3 Mg, Friday - 3 Mg, Saturday - 3 Mg 8)  Bentyl 20 Mg Tabs (Dicyclomine Hcl) .... Take One Tablet By Mouth Three Times A Day  Allergies (verified): 1)  Ace Inhibitors  Comments:  Nurse/Medical Assistant: The patient's medications and allergies were reviewed with the patient and were updated in the Medication and Allergy Lists. Kathlene November LPN (April 04, 2010 2:41 PM)  Past History:  Past Medical History: Reviewed history from 02/28/2010 and no changes required. ? MS --  HTN obesity smoking Depression/ Anxiety Migraines Lumbar facet hypertrophy; MRI 07 Old CVA on MRI brain L basilic vein  DVT 12-2009 seizures  Neuro Dr Mickie Kay. heme Dr Abbe Amsterdam  Past  Surgical History: Reviewed history from 03/09/2009 and no changes required. BTL 1992 Tonsillectomy  Family History: Reviewed history from 03/09/2009 and no changes required. identical twin sister -- HTN, valve disease.  diabetes, depression. mom melanoma at 30s 2 half brothers  No MS in the family father unknown  Social History: Reviewed history from 03/09/2009 and no changes required. 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   Occas ETOH. 5+ coffee/ day. 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  does some walking. Poor diet. Weight stable x 3 yrs.  Review of Systems General:  Complains of fatigue; denies chills, fever, loss of appetite, sleep disorder, and weight loss. ENT:  Denies difficulty swallowing. CV:  Denies chest pain or discomfort, difficulty breathing while lying down, palpitations, swelling of feet, and swelling of hands. Resp:  Denies  shortness of breath and wheezing. MS:  denies neck pain. Psych:  Complains of depression.  Physical Exam  General:  alert, well-developed, well-nourished, and well-hydrated.  obese Head:  normocephalic and atraumatic.   Mouth:  pharynx pink and moist and fair dentition.   Neck:  no masses.   Lungs:  Normal respiratory effort, chest expands symmetrically. Lungs are clear to auscultation, no crackles or wheezes.  no splinting Heart:  normal rate, regular rhythm, and no murmur.   Msk:  full C spine active ROM tight/ tender trap muscles no reproducible chest wall TTP Pulses:  2+ radial  pulses Extremities:  no UE or LE edema Neurologic:  no focal weakness over the LUE Skin:  color normal.   Cervical Nodes:  No lymphadenopathy noted Psych:  flat affect.     Impression & Recommendations:  Problem # 1:  NUMBNESS, ARM (ICD-782.0)  L arm numbness/ L chest numbness with subtherapeutic INR for a LUE DVT. Will get EKG today. NSR at 71 bpm with left axis deviation but no ischemia or arrythmia. This could be a migraine variant, cervical radiculopathy, or a topamax SE.   Note to Dr Marlena Clipper.  Orders: T-D-Dimer Fibrin Derivatives Quantitive ([id number])  Complete Medication List: 1)  Topamax 100 Mg Tabs (Topiramate) .... Take 1 tab by mouth two times a day 2)  Diazepam 10 Mg Tabs (Diazepam) .Marland Kitchen.. 1 tab by mouth at bedtime as needed muscle spasm 3)  Alprazolam 1 Mg Tabs (Alprazolam) .Marland Kitchen.. 1 tab by mouth qam as needed anxiety 4)  Tussicaps 10-8 Mg Xr12h-cap (Hydrocod polst-chlorphen polst) .Marland Kitchen.. 1 capsule at bedtime as needed for cough 5)  Metronidazole 500 Mg Tabs (Metronidazole) .... Take 1 tablet by mouth three times a day for 10 days 6)  Bentyl 20 Mg Tabs (Dicyclomine hcl) .... Take one tablet by mouth three times a day 7)  Coumadin 3 Mg Tabs (Warfarin sodium) .... Sunday - 3 mg, monday - 5 mg, tuesday - 3 mg, wednesday - 5 mg, thursday - 3 mg, friday - 5 mg, saturday - 3 mg 8)   Coumadin 1 Mg Tabs (Warfarin sodium) .... Sunday - 3 mg, monday - 5 mg, tuesday - 3 mg, wednesday - 5 mg, thursday - 3 mg, friday - 5 mg, saturday - 3 mg  Other Orders: Fingerstick (86578) Protime INR (46962) T-TSH ([id number]) T-Vitamin B12 ([id number]) T-Folate (44034) T-CBC w/Diff ([id number])  Anticoagulation Management Assessment/Plan:      The target INR is 2.0-3.0.  She is to have a PT/INR in 2 weeks.  Anticoagulation instructions were given to patient.         Current Anticoagulation Instructions: The patient's  dosage of coumadin will be increased.  The new dosage includes:   Coumadin 3 mg tabs and Coumadin 1 mg tabs:  Sunday - 3 mg, Monday - 5 mg, Tuesday - 3 mg, Wednesday - 5 mg, Thursday - 3 mg, Friday - 5 mg, Saturday - 3 mg.    Repeat PT/INR in 2 weeks.    Patient Instructions: 1)  Labs today. 2)  Will call you w/ results tomorrow. 3)  EKG is normal today. 4)  I will talk to Dr Meyers about your L arm numbness. 5)  REturn for follow up in 6 wks. Prescriptions: COUMADIN 3 MG TABS (WARFARIN SODIUM) Sunday - 3 mg, Monday - 5 mg, Tuesday - 3 mg, Wednesday - 5 mg, Thursday - 3 mg, Friday - 5 mg, Saturday - 3 mg  #30 x 1   Entered and Authorized by:   Aleen Bowen DO   Signed by:   Nashea Bowen DO on 04/04/2010   Method used:   Electronically to        [facility]  [id number]* (retail)       [address]       [place], [place]  [address]       Ph: [phone]       Fax: [phone]   RxID:   [id number]    Orders Added: 1)  Fingerstick [36416] 2)  Protime INR [85610] 3)  T-TSH [[id number]] 4)  T-Vitamin B12 [[id number]] 5)  T-Folate [23340] 6)  T-CBC w/Diff [[id number]] 7)  T-D-Dimer Fibrin Derivatives Quantitive [[id number]] 8)  Est. Patient Level IV [33295]    Laboratory Results   Blood Tests   Date/Time Received: 04/04/2010 Date/Time Reported: 04/04/2010   INR: 1.2   (Normal Range: 0.88-1.12   Therap INR: 2.0-3.5)     Appended  Document: L arm numbness/ INR

## 2010-06-14 NOTE — Assessment & Plan Note (Signed)
Summary: 2 week INR check - jr  Nurse Visit   Vitals Entered By: Payton Spark CMA (April 18, 2010 11:14 AM)  Allergies: 1)  Ace Inhibitors Laboratory Results   Blood Tests      INR: 1.1   (Normal Range: 0.88-1.12   Therap INR: 2.0-3.5)    Orders Added: 1)  Fingerstick [36416] 2)  Protime INR [85610] Prescriptions: COUMADIN 5 MG TABS (WARFARIN SODIUM) Sunday - 5 mg, Monday - 5 mg, Tuesday - 5 mg, Wednesday - 5 mg, Thursday - 5 mg, Friday - 5 mg, Saturday - 5 mg  #30 x 0   Entered and Authorized by:   Kennetta Bowen DO   Signed by:   Gerri Bowen DO on 04/18/2010   Method used:   Electronically to        [facility]  [id number]* (retail)       [address], [place]  [address]       Ph: [phone]       Fax: [phone]   RxID:   [id number]    Anticoagulation Management History:      The patient is on coumadin and comes in today for a routine follow up visit.  Coumadin therapy is being given due to the first episode of deep venous thrombosis and/or pulmonary embolism.  Anticipated length of treatment is 3-6 months.  Her last INR was 1.2 and today's INR is 1.1.    Anticoagulation Management Assessment/Plan:      The target INR is 2.0-3.0.  She is to have a PT/INR in 1 week.  Anticoagulation instructions were given to patient.         Current Anticoagulation Instructions: The patient's dosage of coumadin will be increased.  The new dosage includes:   Coumadin 5 mg tabs:  Sunday - 5 mg, Monday - 5 mg, Tuesday - 5 mg, Wednesday - 5 mg, Thursday - 5 mg, Friday - 5 mg, Saturday - 5 mg.    Repeat PT/INR in 1 week.

## 2010-06-14 NOTE — Letter (Signed)
Summary: [facility]  [facility]   Imported By: Lanelle Bal 01/20/2010 08:16:07  _____________________________________________________________________  External Attachment:    Type:   Image     Comment:   External Document

## 2010-06-14 NOTE — Assessment & Plan Note (Signed)
Summary: INR check-jr  Nurse Visit   Vitals Entered By: Payton Spark CMA (February 07, 2010 1:21 PM)  Allergies: 1)  Ace Inhibitors Laboratory Results   Blood Tests      INR: 1.7   (Normal Range: 0.88-1.12   Therap INR: 2.0-3.5)    Orders Added: 1)  Fingerstick [36416] 2)  Protime INR [16109]   Anticoagulation Management History:      The patient is on coumadin and comes in today for a routine follow up visit.  She is being anticoagulated because of the first episode of deep venous thrombosis and/or pulmonary embolism.  Anticipated length of treatment is 3-6 months.  Her last INR was 1.7 and today's INR is 1.7.    Anticoagulation Management Assessment/Plan:      The target INR is 2.0-3.0.  She is to have a PT/INR in 2 weeks.  Anticoagulation instructions were given to patient.         Current Anticoagulation Instructions: The patient's dosage of coumadin will be increased.  The new dosage includes:   Coumadin 6 mg tabs and Coumadin 1 mg tabs:  Sunday - 6 mg, Monday - 8 mg, Tuesday - 6 mg, Wednesday - 6 mg, Thursday - 6 mg, Friday - 8 mg, Saturday - 6 mg.    Repeat PT/INR in 2 weeks.     Appended Document: INR check-jr LM w/ female at above # for Pt to CB.Arvilla Market CMA, Michelle February 07, 2010 3:02 PM   Pt aware of the above. Pt states she had apt w/ hematology (saw a PA) yesterday and was told that her weight was the reason that she had clotting issues. She is also scheduled for lower leg doppler today. Pt will have all notes, labs and scans forwarded to you.Arvilla Market CMA, [facility] February 08, 2010 8:19 AM

## 2010-06-14 NOTE — Assessment & Plan Note (Signed)
Summary: INR/ mood/ coumadin   Vital Signs:  Patient profile:   40 year old female Menstrual status:  regular Height:      66 inches Weight:      250 pounds BMI:     40.50 O2 Sat:      99 % on Room air Temp:     98.1 degrees F Pulse rate:   98 / minute BP sitting:   126 / 84  (left arm) Cuff size:   large  Vitals Entered By: Ashley Gomez CMA (February 28, 2010 1:46 PM)  O2 Flow:  Room air CC: F/u.    History of Present Illness: 40 yo WF presents for f/u visit.  She stopped Keppra that was started by Dr Lenise Arena for migraine and seizures but it was making her depressed.  She changed herself back to Topamax.  She has not had any more seizures.  On oiumadin for a LUE DVT x 1 month now.  She saw Dr Abbe Amsterdam and it was determined that she needed to stay on it3-4 months and that following completion, check ATIII, Protein C and S.  Her LUE pain and redness have improved though she has pins and needles in the upper arm w/o shoulder or neck pain.    She is still regular periods.  They come every month and she has started to have hot and cold spells.  She has a URI currently with a dry cough that is keeping her up at night.  She declined psychology referral visit b/c she wanted workers comp to pay for this.  Anticoagulation Management History:      The patient is on coumadin and comes in today for a routine follow up visit.  She is being anticoagulated because of the first episode of deep venous thrombosis and/or pulmonary embolism.  Anticipated length of treatment is 3-6 months.  Her last INR was 2.3 and today's INR is 2.1.    Current Medications (verified): 1)  Topamax 100 Mg Tabs (Topiramate) .... Take 1 Tab By Mouth Two Times A Day 2)  Diazepam 10 Mg Tabs (Diazepam) .Marland Kitchen.. 1 Tab By Mouth At Bedtime As Needed Muscle Spasm 3)  Alprazolam 1 Mg Tabs (Alprazolam) .Marland Kitchen.. 1 Tab By Mouth Qam As Needed Anxiety 4)  Maxzide-25 37.5-25 Mg Tabs (Triamterene-Hctz) .Marland Kitchen.. 1 Tab By Mouth Daily 5)   Coumadin 1 Mg Tabs (Warfarin Sodium) .... Sunday - 6 Mg, Monday - 8 Mg, Tuesday - 6 Mg, Wednesday - 6 Mg, Thursday - 6 Mg, Friday - 8 Mg, Saturday - 6 Mg 6)  Coumadin 6 Mg Tabs (Warfarin Sodium) .... Sunday - 6 Mg, Monday - 8 Mg, Tuesday - 6 Mg, Wednesday - 6 Mg, Thursday - 6 Mg, Friday - 8 Mg, Saturday - 6 Mg  Allergies (verified): 1)  Ace Inhibitors  Past History:  Past Medical History: ? MS --  HTN obesity smoking Depression/ Anxiety Migraines Lumbar facet hypertrophy; MRI 07 Old CVA on MRI brain L basilic vein  DVT 12-2009 seizures  Neuro Dr Mickie Kay. heme Dr Abbe Amsterdam  Social History: Reviewed history from 03/09/2009 and no changes required. 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   Occas ETOH. 5+ coffee/ day. 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  does some walking. Poor diet. Weight stable x 3 yrs.  Review of Systems      See HPI  Physical Exam  General:  alert, well-developed, well-nourished, and well-hydrated.  obese, here with mom Head:  normocephalic  and atraumatic.   Mouth:  pharynx pink and moist.   Neck:  no masses.   Lungs:  Normal respiratory effort, chest expands symmetrically. Lungs are clear to auscultation, no crackles or wheezes. rhonchi with cough Heart:  Normal rate and regular rhythm. S1 and S2 normal without gallop, murmur, click, rub or other extra sounds. Extremities:  no UE or LE edema Skin:  color normal.   Psych:  depressed affect.     Impression & Recommendations:  Problem # 1:  DVT (ICD-453.40) Adjusted coumadin up by 2 mg/ wk so that she can start eating some (the same amt each day) of veggies into her diet. Her LUE pain and swelling have improved.  The plan is to stay on coumadin until new Years.  I reviewed the hematology notes.  She will have an Anti thrombin III, Protein C and S level drawn in Jan off coumadin to determine if she is high risk.  She is to stop smoking. Orders: Fingerstick (01027) Protime INR  (25366)  Problem # 2:  MIGRAINE WITHOUT AURA (ICD-346.10) Migraines and Seizures managed by Dr Lenise Arena.   I wil inform him that she changed her Keppra back to topamax due to depression as s/e on Keppra.  She continues to c/o migraines all the time.  Incidentally, she has OSA but is NOT wearing a CPAP machine which may be why she is so tired and has HAs so often.  She claims that CPAP caused sinusitis.  -- will look into this more.    Problem # 3:  DEPRESSIVE DISORDER NOT ELSEWHERE CLASSIFIED (ICD-311) I advised her to see pyschologist but she has refused due to cost.  She appears depressed today but is not suicidal.  She has improved some on current regimen and has refused SSRIs.   Her updated medication list for this problem includes:    Diazepam 10 Mg Tabs (Diazepam) .Marland Kitchen... 1 tab by mouth at bedtime as needed muscle spasm    Alprazolam 1 Mg Tabs (Alprazolam) .Marland Kitchen... 1 tab by mouth qam as needed anxiety  Complete Medication List: 1)  Topamax 100 Mg Tabs (Topiramate) .... Take 1 tab by mouth two times a day 2)  Diazepam 10 Mg Tabs (Diazepam) .Marland Kitchen.. 1 tab by mouth at bedtime as needed muscle spasm 3)  Alprazolam 1 Mg Tabs (Alprazolam) .Marland Kitchen.. 1 tab by mouth qam as needed anxiety 4)  Coumadin 6 Mg Tabs (Warfarin sodium) .... Sunday - 6 mg, monday - 8 mg, tuesday - 6 mg, wednesday - 8 mg, thursday - 6 mg, friday - 8 mg, saturday - 6 mg 5)  Coumadin 1 Mg Tabs (Warfarin sodium) .... Sunday - 6 mg, monday - 8 mg, tuesday - 6 mg, wednesday - 8 mg, thursday - 6 mg, friday - 8 mg, saturday - 6 mg 6)  Tussicaps 10-8 Mg Xr12h-cap (Hydrocod polst-chlorphen polst) .Marland Kitchen.. 1 capsule at bedtime as needed for cough  Other Orders: Admin 1st Vaccine (44034) Flu Vaccine 1yrs + (74259)  Anticoagulation Management Assessment/Plan:      The target INR is 2.0-3.0.  She is to have a PT/INR in 4 weeks.  Anticoagulation instructions were given to patient.         Current Anticoagulation Instructions: The patient's dosage of  coumadin will be increased.  The new dosage includes:   Coumadin 6 mg tabs and Coumadin 1 mg tabs:  Sunday - 6 mg, Monday - 8 mg, Tuesday - 6 mg, Wednesday - 8 mg, Thursday - 6 mg,  Friday - 8 mg, Saturday - 6 mg.    Repeat PT/INR in 4 weeks.    Patient Instructions: 1)  Adjust coumadin -- see sheet. 2)  REcheck INR in 4 wks (nurse visit). 3)  I will send Dr Lenise Arena a note re: your sleep, HAs and change back to Topamax. 4)  Consider starting antidepressant for your mood. 5)  REturn for follow up visit in 2 mos.   Prescriptions: ALPRAZOLAM 1 MG TABS (ALPRAZOLAM) 1 tab by mouth qAM as needed anxiety  #30 x 0   Entered by:   Ashley Gomez CMA   Authorized by:   Seymour Bars DO   Signed by:   Ashley Gomez CMA on 02/28/2010   Method used:   Printed then faxed to ...       [facility] [phone]* (retail)       [address] [place], [place]  [address]       Ph: [phone]       Fax: [phone]   RxID:   [id number] DIAZEPAM 10 MG TABS (DIAZEPAM) 1 tab by mouth at bedtime as needed muscle spasm  #30 x 0   Entered by:   Ashley Gomez CMA   Authorized by:   Seymour Bars DO   Signed by:   Ashley Gomez CMA on 02/28/2010   Method used:   Printed then faxed to ...       [facility] [phone]* (retail)       [address] [place], [place]  [address]       Ph: [phone]       Fax: [phone]   RxID:   [id number]    Orders Added: 1)  Fingerstick [36416] 2)  Protime INR [85610] 3)  Admin 1st Vaccine [90471] 4)  Flu Vaccine 23yrs + [90658] 5)  Est. Patient Level IV [67124]    Laboratory Results   Blood Tests      INR: 2.1   (Normal Range: 0.88-1.12   Therap INR: 2.0-3.5)    Flu Vaccine Consent Questions     Do you have a history of severe allergic reactions to this vaccine? no    Any prior history of allergic reactions to egg and/or gelatin? no    Do you have a sensitivity to the preservative Thimersol? no    Do you have a past history of  Guillan-Barre Syndrome? no    Do you currently have an acute febrile illness? no    Have you ever had a severe reaction to latex? no    Vaccine information given and explained to patient? yes    Are you currently pregnant? no    Lot Number:[id number]   Exp Date:11/12/2010   Site Given  Left Deltoid IM  Anticoagulation Management History:      The patient is on coumadin and comes in today for a routine follow up visit.  She is being anticoagulated because of the first episode of deep venous thrombosis and/or pulmonary embolism.  Anticipated length of treatment is 3-6 months.  Her last INR was 2.3 and today's INR is 2.1.     Marland Kitchenlbflu

## 2010-06-14 NOTE — Assessment & Plan Note (Signed)
Summary: INR//mpm  Nurse Visit   Vitals Entered By: Payton Spark CMA (February 14, 2010 3:21 PM)  Allergies: 1)  Ace Inhibitors Laboratory Results   Blood Tests      INR: 2.3   (Normal Range: 0.88-1.12   Therap INR: 2.0-3.5)    Orders Added: 1)  Fingerstick [36416] 2)  Protime INR [04540]   Anticoagulation Management History:      The patient is on coumadin and comes in today for a routine follow up visit.  She is being anticoagulated because of the first episode of deep venous thrombosis and/or pulmonary embolism.  Anticipated length of treatment is 3-6 months.  Her last INR was 1.7 and today's INR is 2.3.    Anticoagulation Management Assessment/Plan:      The target INR is 2.0-3.0.  She is to have a PT/INR in 2 weeks.  Anticoagulation instructions were given to patient.         Current Anticoagulation Instructions: The patient is to continue with the same dose of coumadin.  This dosage includes:   Coumadin 1 mg tabs and Coumadin 6 mg tabs:  Sunday - 6 mg, Monday - 8 mg, Tuesday - 6 mg, Wednesday - 6 mg, Thursday - 6 mg, Friday - 8 mg, Saturday - 6 mg.    Repeat PT/INR in 2 weeks.

## 2010-06-14 NOTE — Assessment & Plan Note (Signed)
Summary: f/u DVT/ INR   Vital Signs:  Patient profile:   40 year old female Menstrual status:  regular Height:      66 inches Weight:      247 pounds BMI:     40.01 O2 Sat:      98 % on Room air Pulse rate:   96 / minute BP sitting:   116 / 80  (left arm) Cuff size:   large  Vitals Entered By: Payton Spark CMA (January 26, 2010 11:22 AM)  O2 Flow:  Room air CC: F/U cellulitis and INR   Primary Care Provider:  Seymour Bars DO  CC:  F/U cellulitis and INR.  History of Present Illness: 40 yo WF presents for f/u LUE cellulitis secondary to a DVT sustained after IV infusion of Phenytoin for new dx of seizures.  She has been back to Dr Lenise Arena and is still on Topomax 100 mg two times a day for both seizure prevention and migraines.  She is having chronic daily HA still.  She feels this has worsened after she started on Warfarin.  She is not taking anything.  She has not had anymore seizures.    She has tingling and some throbbing from what she thinks is coming from the veins in her legs.  She is very anxious about her new health issues that she cannot sleep at night even with use of Diazepam 10 mg at bedtime.  She is using 1 tab of Alprazolam in the AM.    She has not had a hypercoagulable w/u thus far.  She had findings on a old CVA on her brain MRI while in the hospital.  She had never had a blood clot prior to this that she knew of.    Anticoagulation Management History:      The patient is on coumadin and comes in today for a routine follow up visit.  She is being anticoagulated because of the first episode of deep venous thrombosis and/or pulmonary embolism.  Anticipated length of treatment is 3-6 months.  Her last INR was 1.8 and today's INR is 1.7.    Current Medications (verified): 1)  Topamax 100 Mg Tabs (Topiramate) .... Take 1 Tab By Mouth Two Times A Day 2)  Diazepam 10 Mg Tabs (Diazepam) .Marland Kitchen.. 1 Tab By Mouth At Bedtime As Needed Muscle Spasm 3)  Alprazolam 1 Mg Tabs  (Alprazolam) .Marland Kitchen.. 1 Tab By Mouth Qam As Needed Anxiety 4)  Maxzide-25 37.5-25 Mg Tabs (Triamterene-Hctz) .Marland Kitchen.. 1 Tab By Mouth Daily 5)  Voltaren 1 % Gel (Diclofenac Sodium) .... Use As Directed 6)  Lovenox 100 Mg/ml Soln (Enoxaparin Sodium) .... Take As Directed Two Times A Day X 7 Days. 7)  Coumadin 5 Mg Tabs (Warfarin Sodium) .... Sunday - 5 Mg, Monday - 6 Mg, Tuesday - 5 Mg, Wednesday - 5 Mg, Thursday - 5 Mg, Friday - 6 Mg, Saturday - 5 Mg 8)  Coumadin 1 Mg Tabs (Warfarin Sodium) .... Sunday - 5 Mg, Monday - 6 Mg, Tuesday - 5 Mg, Wednesday - 5 Mg, Thursday - 5 Mg, Friday - 6 Mg, Saturday - 5 Mg  Allergies (verified): 1)  Ace Inhibitors  Past History:  Past Medical History: ? MS --  HTN obesity smoking Depression/ Anxiety Migraines Lumbar facet hypertrophy; MRI 07 Old CVA on MRI brain L basilic vein  DVT 12-2009 seizures  Neuro Dr Mickie Kay.  Past Surgical History: Reviewed history from 03/09/2009 and no changes required. BTL 1992 Tonsillectomy  Family History: Reviewed history from 03/09/2009 and no changes required. identical twin sister -- HTN, valve disease.  diabetes, depression. mom melanoma at 30s 2 half brothers  No MS in the family father unknown  Social History: Reviewed history from 03/09/2009 and no changes required. 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   Occas ETOH. 5+ coffee/ day. 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  does some walking. Poor diet. Weight stable x 3 yrs.  Review of Systems      See HPI  Physical Exam  General:  alert, well-developed, well-nourished, and well-hydrated.  obesity Head:  normocephalic and atraumatic.   Extremities:  spider veins both LEs minimal non pitting LE edema Skin:  much improved LUE redness. no warmth.  Less tender.  palpable clot in the L basilic vein Cervical Nodes:  No lymphadenopathy noted Psych:  depressed affect.     Impression & Recommendations:  Problem # 1:  DVT (ICD-453.40) L UE  DVT felt to be from Phenytoin IV infusion with subsequent cellulitis.   She is still having some radiating discomfort in the LUE with swelling though her cellulitis is much improved after the round of Bactrim DS. She is still subtherapeutic on her warfarin.  I have adjusted her dose to 6 mg qPM and asked her to come back in 10 days to recheck.  I also [facility] on warfarin diet.  Refer to hematology for hypercoag w/u.  She has questions about a filter placment but not sure if this is possible with an UE clot. Orders: Fingerstick (04540) Protime INR (98119) Hematology Referral (Hematology)  Problem # 2:  MIGRAINE WITHOUT AURA (ICD-346.10) She is having daily HA, on Topamax. I did not feel comfortable adding on anything new since she has a neurologist and has a new dx of seizures. I will defer this to Dr Lenise Arena.  Problem # 3:  ANXIETY STATE, UNSPECIFIED (ICD-300.00) Worsened by recent health problems.  She is only on benzos at this point and has been reluctant to start SSRIs.  I think she would benefit from something daily like Lexapro.  I will have her start by seeing our counselor downstairs and f/u in 4 wks. Her updated medication list for this problem includes:    Diazepam 10 Mg Tabs (Diazepam) .Marland Kitchen... 1 tab by mouth at bedtime as needed muscle spasm    Alprazolam 1 Mg Tabs (Alprazolam) .Marland Kitchen... 1 tab by mouth qam as needed anxiety  Orders: Psychology Referral (Psychology)  Problem # 4:  SEIZURE DISORDER (ICD-780.39) Seizure free since hospitalization, on Topomax and seeing Dr Lenise Arena. Her updated medication list for this problem includes:    Topamax 100 Mg Tabs (Topiramate) .Marland Kitchen... Take 1 tab by mouth two times a day  Complete Medication List: 1)  Topamax 100 Mg Tabs (Topiramate) .... Take 1 tab by mouth two times a day 2)  Diazepam 10 Mg Tabs (Diazepam) .Marland Kitchen.. 1 tab by mouth at bedtime as needed muscle spasm 3)  Alprazolam 1 Mg Tabs (Alprazolam) .Marland Kitchen.. 1 tab by mouth qam as  needed anxiety 4)  Maxzide-25 37.5-25 Mg Tabs (Triamterene-hctz) .Marland Kitchen.. 1 tab by mouth daily 5)  Coumadin 5 Mg Tabs (Warfarin sodium) .... Sunday - 6 mg, monday - 6 mg, tuesday - 6 mg, wednesday - 6 mg, thursday - 6 mg, friday - 6 mg, saturday - 6 mg 6)  Coumadin 1 Mg Tabs (Warfarin sodium) .... Sunday - 6 mg, monday - 6 mg, tuesday - 6  mg, wednesday - 6 mg, thursday - 6 mg, friday - 6 mg, saturday - 6 mg  Anticoagulation Management Assessment/Plan:      The target INR is 2.0-3.0.  She is to have a PT/INR in 2 weeks.  Anticoagulation instructions were given to patient.         Current Anticoagulation Instructions: The patient's dosage of coumadin will be increased.  The new dosage includes:   Coumadin 5 mg tabs and Coumadin 1 mg tabs:  Sunday - 6 mg, Monday - 6 mg, Tuesday - 6 mg, Wednesday - 6 mg, Thursday - 6 mg, Friday - 6 mg, Saturday - 6 mg.    Repeat PT/INR in 2 weeks.    Patient Instructions: 1)  RECHECK INR in 10 days. 2)  Increase Coumain to 6 mg every evening and read thru h/o on diet. 3)  Cellulitis much improved. 4)  Referrals made to Dr Abbe Amsterdam( hematologist)   and Elray Buba (counselor).  I will defer HA questions to Dr Lenise Arena. 5)  REturn for follow up sleep / mood with me in 4 wks.  Laboratory Results   Blood Tests      INR: 1.7   (Normal Range: 0.88-1.12   Therap INR: 2.0-3.5)

## 2010-06-16 NOTE — Assessment & Plan Note (Signed)
Summary: f/u DVT - stop coumadin   Vital Signs:  Patient profile:   40 year old female Menstrual status:  regular Height:      66 inches Weight:      252 pounds BMI:     40.82 O2 Sat:      98 % on Room air Pulse rate:   97 / minute BP sitting:   132 / 94  (left arm) Cuff size:   regular  Vitals Entered By: Payton Spark CMA (May 17, 2010 1:26 PM)  O2 Flow:  Room air CC: F/U. C/O irregular menses, upper back pain, HA and lightheaded   Primary Care Provider:  Seymour Bars DO  CC:  F/U. C/O irregular menses, upper back pain, and HA and lightheaded.  History of Present Illness: Ashley Gomez presents for f/u visit.  She has been complaining of feeling lightheaded.  She has not had a period since Sept 2011.  She had tubal ligation years ago.  Denies any blood in her stools.  No longer having diarrhea.  She is back to work and is feeling more tired and c/o swelling in her legs.  She has HAs and back pain.  She sees Dr Lenise Arena and is due to see him  back for migraines and seizure hx.  She moved and thinks that is the problem. Bentyl has really helped her IBS.  She is ready to STOP her Warfarin after having an UE DVT following IV infusion of Dilantin in the ED in Aug 2011.  she saw DR Abbe Amsterdam for findings of old CVA on MRI brain along with this DVT for hypercoag w/u and is due for Protein C and S and ATIII once off coumadin.  She needs to schedule her f/u visit.  She has failed to become therapeutic over the past few months though her dose has been steadily increased.  She does continue to smoke.  She c/o pain in her legs, swelling with hx of varicose veins and superficial thrombophlebitis.  She has compression hose but is not routinely wearing htem.  She is obese and stands for hours at a time at work.     Current Medications (verified): 1)  Topamax 100 Mg Tabs (Topiramate) .... Take 1 Tab By Mouth Two Times A Day 2)  Diazepam 10 Mg Tabs (Diazepam) .Marland Kitchen.. 1 Tab By Mouth At Bedtime As  Needed Muscle Spasm 3)  Alprazolam 1 Mg Tabs (Alprazolam) .Marland Kitchen.. 1 Tab By Mouth Qam As Needed Anxiety 4)  Bentyl 20 Mg Tabs (Dicyclomine Hcl) .... Take One Tablet By Mouth Three Times A Day 5)  Coumadin 7.5 Mg Tabs (Warfarin Sodium) .... Sunday - 7.5 Mg, Monday - 7.5 Mg, Tuesday - 7.5 Mg, Wednesday - 7.5 Mg, Thursday - 7.5 Mg, Friday - 7.5 Mg, Saturday - 7.5 Mg  Allergies (verified): 1)  Ace Inhibitors  Past History:  Past Medical History: Reviewed history from 02/28/2010 and no changes required. ? MS --  HTN obesity smoking Depression/ Anxiety Migraines Lumbar facet hypertrophy; MRI 07 Old CVA on MRI brain L basilic vein  DVT 12-2009 seizures  Neuro Dr Mickie Kay. heme Dr Abbe Amsterdam  Past Surgical History: Reviewed history from 03/09/2009 and no changes required. BTL 1992 Tonsillectomy  Family History: Reviewed history from 03/09/2009 and no changes required. identical twin sister -- HTN, valve disease.  diabetes, depression. mom melanoma at 30s 2 half brothers  No MS in the family father unknown  Social History: Reviewed history from 03/09/2009 and no changes  required. 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   Occas ETOH. 5+ coffee/ day. 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  does some walking. Poor diet. Weight stable x 3 yrs.  Review of Systems      See HPI  Physical Exam  General:  alert, well-developed, well-nourished, and well-hydrated.  obese Head:  normocephalic and atraumatic.   Eyes:  sclera non icteric; no conjunctival pallor Nose:  no nasal discharge.   Mouth:  pharynx pink and moist and fair dentition.   Neck:  no masses.   Lungs:  Normal respiratory effort, chest expands symmetrically. Lungs are clear to auscultation, no crackles or wheezes. Heart:  Normal rate and regular rhythm. S1 and S2 normal without gallop, murmur, click, rub or other extra sounds. Abdomen:  soft, non-tender, normal bowel sounds, no distention, no masses, no guarding,  no hepatomegaly, and no splenomegaly.   Msk:  no joint tenderness, no joint swelling, no joint warmth, and no redness over joints.   Pulses:  2+ radial and pedal pulses Extremities:  no UE edema, trace bilat LE edema mild bilat varicose veins visible bilat Neurologic:  gait normal.   Skin:  color normal.   Cervical Nodes:  No lymphadenopathy noted Psych:  depressed affect.     Impression & Recommendations:  Problem # 1:  ABSENCE OF MENSTRUATION (ICD-626.0) Upreg NEG.  Likely perimenopausal state.  Check TSH with labs.   Orders: T-TSH ([id number]) Urine Pregnancy Test  ([address]11) UA Dipstick w/o Micro (automated)  (81003)  Problem # 2:  DVT (ICD-453.40) She completed 4 mos of coumadin after an UE DVT following IV Dilantin infusion and met with Dr Abbe Amsterdam for ? hypercoag w/u.  I provided her phone # today for pt to f/u there to complete her labs.  Discussed smoking cessation to cut her risks for future clots. Orders: Fingerstick (91478) Protime INR (29562)  Problem # 3:  POSTURAL LIGHTHEADEDNESS (ICD-780.4) UA normal -- no dehydration, proteinuria or infection.  Likely multifactoral -- depression, recent return to work with long hrs of standing along with dependent edema, inadequate fluid intake, drug side effects, etc.  BP/ HR/ heart and lung exam stable today.  Will repeat her CBC and iron studies.  Work on fluid hydration with water, elevate legs when possible and wear compression hose for swelling.  Continue PNV for low folate level. Orders: T-CBC No Diff ([id number]) T-Ferritin ([phone]) T-Iron ([id number]) T-Iron Binding Capacity (TIBC) ([address])  Problem # 4:  VARICOSE VEINS, LOWER EXTREMITIES (ICD-454.9) She is symptomatic with dependent edema from prolonged hrs of standing.  Obesity does not help with a BMI of 40 and no exercise. Advised pt to wear her compression hose to work each day.  If edema/ pain not improving over the next 4 wks, she is free to call for a  referral to the vein clinic. BP today: 132/94 Prior BP: 130/85 (04/04/2010)  Labs Reviewed: K+: 3.5 (03/10/2010) Creat: : 0.96 (03/10/2010)   Chol: 190 (03/09/2009)   HDL: 40 (03/09/2009)   LDL: 124 (03/09/2009)   TG: 129 (03/09/2009)  Problem # 5:  ESSENTIAL HYPERTENSION, BENIGN (ICD-401.1) BP higher than usual today. Recheck at next visit.  No changes made today. BP today: 132/94 Prior BP: 130/85 (04/04/2010)  Labs Reviewed: K+: 3.5 (03/10/2010) Creat: : 0.96 (03/10/2010)   Chol: 190 (03/09/2009)   HDL: 40 (03/09/2009)   LDL: 124 (03/09/2009)   TG: 129 (03/09/2009)  Complete Medication List: 1)  Topamax 100 Mg Tabs (Topiramate) .Marland KitchenMarland KitchenMarland Kitchen  Take 1 tab by mouth two times a day 2)  Diazepam 10 Mg Tabs (Diazepam) .Marland Kitchen.. 1 tab by mouth at bedtime as needed muscle spasm 3)  Alprazolam 1 Mg Tabs (Alprazolam) .Marland Kitchen.. 1 tab by mouth qam as needed anxiety 4)  Bentyl 20 Mg Tabs (Dicyclomine hcl) .... Take one tablet by mouth three times a day  Patient Instructions: 1)  Upreg: 2)  Labs today. 3)  OK to STOP coumadin/ warfarin!!! 4)  F/U with Dr Abbe Amsterdam for the rest of your hypercoagulable labs. 5)  F/U with Dr Lenise Arena for migraines/ seizure history. 6)  Wear compression hose during the workday. 7)  Hydrate with water throughout the day. 8)  Call if you would like to see the vein clinic. 9)  Return for f/u in 3 mos.   Orders Added: 1)  Fingerstick [36416] 2)  Protime INR [85610] 3)  T-CBC No Diff [[address]10000] 4)  T-Ferritin [82728-23350] 5)  T-Iron [[id number]] 6)  T-Iron Binding Capacity (TIBC) [[address]] 7)  T-TSH [13086-57846] 8)  Urine Pregnancy Test  [81025] 9)  UA Dipstick w/o Micro (automated)  [81003] 10)  Est. Patient Level IV [96295]    Laboratory Results   Urine Tests    Routine Urinalysis   Color: yellow Appearance: Clear Glucose: negative   (Normal Range: Negative) Bilirubin: negative   (Normal Range: Negative) Ketone: negative   (Normal Range: Negative) Spec.  Gravity: 1.015   (Normal Range: 1.003-1.035) Blood: moderate   (Normal Range: Negative) pH: 6.5   (Normal Range: 5.0-8.0) Protein: negative   (Normal Range: Negative) Urobilinogen: 0.2   (Normal Range: 0-1) Nitrite: negative   (Normal Range: Negative) Leukocyte Esterace: negative   (Normal Range: Negative)    Urine HCG: negative  Blood Tests      INR: 1.3   (Normal Range: 0.88-1.12   Therap INR: 2.0-3.5)

## 2010-06-16 NOTE — Progress Notes (Signed)
Summary: Muscle relaxer Rx  Phone Note Call from Patient   Caller: Patient Summary of Call: Pt [facility] stating she discussed upper back pain w/ you at OV and you suggested muscle relaxer. Pt would like Rx sent to pharm. Please advise. Initial call taken by: Payton Spark CMA,  May 18, 2010 10:25 AM    New/Updated Medications: ROBAXIN 500 MG TABS (METHOCARBAMOL) 2 tabs by mouth three times a day as needed muscle pain Prescriptions: ROBAXIN 500 MG TABS (METHOCARBAMOL) 2 tabs by mouth three times a day as needed muscle pain  #90 x 0   Entered and Authorized by:   Seymour Bars DO   Signed by:   Seymour Bars DO on 05/18/2010   Method used:   Electronically to        [facility] [phone]* (retail)       [address] [place], [place]  [address]       Ph: [phone]       Fax: [phone]   RxID:   [id number]   Appended Document: Muscle relaxer Rx Pt aware of the above

## 2010-06-16 NOTE — Assessment & Plan Note (Signed)
Summary: Coumadin Check-jr  Nurse Visit   Allergies: 1)  Ace Inhibitors Laboratory Results   Blood Tests   Date/Time Received: 05/02/10 Date/Time Reported: 05/02/10   INR: 1.2   (Normal Range: 0.88-1.12   Therap INR: 2.0-3.5)    Orders Added: 1)  Fingerstick [36416] 2)  Protime INR [85610]   Anticoagulation Management History:      The patient is on coumadin and comes in today for a routine follow up visit.  Coumadin therapy is being given due to the first episode of deep venous thrombosis and/or pulmonary embolism.  Anticipated length of treatment is 3-6 months.  Her last INR was 1.1 and today's INR is 1.2.    Anticoagulation Management Assessment/Plan:      The target INR is 2.0-3.0.  She is to have a PT/INR in 1 week.  Anticoagulation instructions were given to patient.         Current Anticoagulation Instructions: The patient's dosage of coumadin will be increased.  The new dosage includes:   Repeat PT/INR in 1 week.  Coumadin 7.5 mg tabs:  Sunday - 7.5 mg, Monday - 7.5 mg, Tuesday - 7.5 mg, Wednesday - 7.5 mg, Thursday - 7.5 mg, Friday - 7.5 mg, Saturday - 7.5 mg.

## 2010-06-16 NOTE — Progress Notes (Signed)
Summary: Hematologist visit  Phone Note Call from Patient Call back at Home Phone [phone]   Caller: Patient Reason for Call: Talk to Nurse Summary of Call: Pt would like to talk to you about Hematologist office visit Initial call taken by: Lannette Donath,  May 30, 2010 8:52 AM  Follow-up for Phone Call        Pt states her visit w/ hematology was very confusing and she felt the doctor was rude. I suggested Pt call and have notes faxed to [name] for review.  Follow-up by: Payton Spark CMA,  May 30, 2010 2:36 PM

## 2010-06-16 NOTE — Assessment & Plan Note (Signed)
Summary: Coumadin Check - jr  Nurse Visit   Vitals Entered By: Payton Spark CMA (April 26, 2010 12:59 PM)  Allergies: 1)  Ace Inhibitors Laboratory Results   Blood Tests      INR: 1.1   (Normal Range: 0.88-1.12   Therap INR: 2.0-3.5)    Orders Added: 1)  Fingerstick [36416] 2)  Protime INR [65784]   Anticoagulation Management History:      The patient is on coumadin and comes in today for a routine follow up visit.  Coumadin therapy is being given due to the first episode of deep venous thrombosis and/or pulmonary embolism.  Anticipated length of treatment is 3-6 months.  Her last INR was 1.1 and today's INR is 1.1.    Anticoagulation Management Assessment/Plan:      The target INR is 2.0-3.0.  She is to have a PT/INR in 1 week.  Anticoagulation instructions were given to patient.         Current Anticoagulation Instructions: The patient's dosage of coumadin will be increased.  The new dosage includes: Coumadin 5 mg tabs and Coumadin 7.5 mg tabs:  Sunday - 5 mg, Monday - 7.5 mg, Tuesday - 5 mg, Wednesday - 7.5 mg, Thursday - 5 mg, Friday - 7.5 mg, Saturday - 5 mg.    Repeat PT/INR in 1 week.     Appended Document: Coumadin Check - jr Pt aware of the above

## 2010-06-16 NOTE — Letter (Signed)
Summary: [facility] No [facility]  Patient No [facility]   Imported By: Lanelle Bal 05/25/2010 10:28:07  _____________________________________________________________________  External Attachment:    Type:   Image     Comment:   External Document

## 2010-06-16 NOTE — Progress Notes (Signed)
Summary: HA and back pain  Phone Note Call from Patient   Caller: Patient Summary of Call: Pt states she has been taking Robaxin and has not gotten any relief. She would like something called in for back pain and HA. Please advise. Initial call taken by: Payton Spark CMA,  May 25, 2010 1:10 PM    New/Updated Medications: ETODOLAC 400 MG TABS (ETODOLAC) 1 tab by mouth two times a day with food as needed for back pain and headache Prescriptions: ETODOLAC 400 MG TABS (ETODOLAC) 1 tab by mouth two times a day with food as needed for back pain and headache  #60 x 1   Entered and Authorized by:   Seymour Bars DO   Signed by:   Seymour Bars DO on 05/25/2010   Method used:   Electronically to        [facility] [phone]* (retail)       [address] [place], [place]  [address]       Ph: [phone]       Fax: [phone]   RxID:   [id number]   Appended Document: HA and back pain Pt aware of the above

## 2010-06-21 ENCOUNTER — Encounter: Payer: Self-pay | Admitting: Family Medicine

## 2010-06-21 ENCOUNTER — Ambulatory Visit (INDEPENDENT_AMBULATORY_CARE_PROVIDER_SITE_OTHER): Payer: PRIVATE HEALTH INSURANCE | Admitting: Family Medicine

## 2010-06-21 ENCOUNTER — Ambulatory Visit
Admission: RE | Admit: 2010-06-21 | Discharge: 2010-06-21 | Disposition: A | Discharge status: Home / Self Care | Payer: Self-pay | Source: Ambulatory Visit | Attending: Family Medicine | Admitting: Family Medicine

## 2010-06-21 ENCOUNTER — Other Ambulatory Visit: Payer: Self-pay | Admitting: Family Medicine

## 2010-06-21 DIAGNOSIS — D649 Anemia, unspecified: Secondary | ICD-10-CM | POA: Insufficient documentation

## 2010-06-21 DIAGNOSIS — M542 Cervicalgia: Secondary | ICD-10-CM

## 2010-06-21 DIAGNOSIS — F329 Major depressive disorder, single episode, unspecified: Secondary | ICD-10-CM

## 2010-06-21 DIAGNOSIS — E538 Deficiency of other specified B group vitamins: Secondary | ICD-10-CM | POA: Insufficient documentation

## 2010-06-21 DIAGNOSIS — R51 Headache: Secondary | ICD-10-CM

## 2010-06-21 DIAGNOSIS — E559 Vitamin D deficiency, unspecified: Secondary | ICD-10-CM | POA: Insufficient documentation

## 2010-06-21 DIAGNOSIS — I82409 Acute embolism and thrombosis of unspecified deep veins of unspecified lower extremity: Secondary | ICD-10-CM

## 2010-06-22 ENCOUNTER — Encounter: Payer: Self-pay | Admitting: Family Medicine

## 2010-06-22 NOTE — Letter (Signed)
Summary: [facility]   Imported By: Lanelle Bal 06/17/2010 08:17:50  _____________________________________________________________________  External Attachment:    Type:   Image     Comment:   External Document

## 2010-06-24 ENCOUNTER — Telehealth (INDEPENDENT_AMBULATORY_CARE_PROVIDER_SITE_OTHER): Payer: Self-pay | Admitting: *Deleted

## 2010-06-28 LAB — CONVERTED CEMR LAB
AntiThromb III Func: 125 % (ref 76–126)
Anticardiolipin IgA: 6 (ref <22)
Anticardiolipin IgG: 2 (ref <23)
Anticardiolipin IgM: 1 (ref <11)
Ferritin: 37 ng/mL (ref 10–291)
HCT: 45.9 % (ref 36.0–46.0)
Hemoglobin: 14.6 g/dL (ref 12.0–15.0)
Homocysteine: 10.6 micromoles/L (ref 4.0–15.4)
Iron: 71 ug/dL (ref 42–145)
MCHC: 31.8 g/dL (ref 30.0–36.0)
MCV: 90.5 fL (ref 78.0–100.0)
Platelets: 258 10*3/uL (ref 150–400)
Protein C Activity: 180 % — ABNORMAL HIGH (ref 75–133)
Protein S Activity: 86 % (ref 69–129)
Protein S Ag, Total: 103 % (ref 70–140)
RBC Folate: 910 ng/mL — ABNORMAL HIGH (ref 180–600)
RBC: 5.07 M/uL (ref 3.87–5.11)
RDW: 16.3 % — ABNORMAL HIGH (ref 11.5–15.5)
Saturation Ratios: 20 % (ref 20–55)
TIBC: 352 ug/dL (ref 250–470)
UIBC: 281 ug/dL
Vit D, 25-Hydroxy: 24 ng/mL — ABNORMAL LOW (ref 30–89)
Vitamin B-12: 316 pg/mL (ref 211–911)
WBC: 5.9 10*3/uL (ref 4.0–10.5)

## 2010-06-30 ENCOUNTER — Encounter: Payer: Self-pay | Admitting: Family Medicine
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
## 2010-06-30 NOTE — Progress Notes (Signed)
Summary: Call patient back   Phone Note Call from Patient   Caller: Patient Summary of Call: Patient called about two things and 1- make sure her med gets prior authorization and 2- she would like to have her lab results... Call patient's cell at [phone]. Thank.Michaelle Copas  June 24, 2010 3:59 PM  Initial call taken by: Michaelle Copas,  June 24, 2010 3:59 PM  Follow-up for Phone Call        [facility] informing Pt Follow-up by: Payton Spark CMA,  June 24, 2010 4:27 PM

## 2010-07-04 ENCOUNTER — Encounter: Payer: Self-pay | Admitting: Family Medicine

## 2010-07-21 NOTE — Medication Information (Signed)
Summary: Celebrex approval   Celebrex approval   Imported By: [name] 07/14/2010 08:41:18  _____________________________________________________________________  External Attachment:    Type:   Image     Comment:   External Document

## 2010-08-02 ENCOUNTER — Telehealth: Payer: Self-pay | Admitting: Family Medicine

## 2010-08-03 ENCOUNTER — Telehealth: Payer: Self-pay | Admitting: *Deleted

## 2010-08-03 NOTE — Telephone Encounter (Signed)
I talked to Dr Lenise Arena on the phone today and he suggests that Ashley Gomez STAY on seizure meds because w/o them, Ashley Gomez is definitely at risk for having another seizure.  All anti seizure meds are fairly pricey, so Dr Lenise Arena wants her to make a f/u visit with him.

## 2010-08-03 NOTE — Telephone Encounter (Signed)
I called and left a message for Dr Lenise Arena.  She was taking Topomax for both seizures and migraine prevention.  If she needs to stay on a medicine for seizure prevention, then she needs to see Dr Lenise Arena back.  If not, I can change her, but I am waiting to hear back.  Pt is free to call Dr Lenise Arena for f/u there since she is overdue.

## 2010-08-03 NOTE — Telephone Encounter (Signed)
Pt aware of the above  

## 2010-08-03 NOTE — Telephone Encounter (Signed)
pls let pt know that topiramate is generic topomax but will still probably be expensive.

## 2010-08-03 NOTE — Telephone Encounter (Signed)
Pt called back stating she already has generic topamax and it is very expensive. Pt would like to know if you can change Rx completely so she can afford it. Please advise.

## 2010-08-03 NOTE — Telephone Encounter (Signed)
LMOM informing Pt of the above 

## 2010-08-04 NOTE — Telephone Encounter (Signed)
Pt aware of the above and will call to schedule apt w/ Dr. Tyrell Antonio.

## 2010-08-11 ENCOUNTER — Encounter: Payer: Self-pay | Admitting: Family Medicine

## 2010-08-16 ENCOUNTER — Encounter: Payer: Self-pay | Admitting: Family Medicine

## 2010-08-16 ENCOUNTER — Ambulatory Visit (INDEPENDENT_AMBULATORY_CARE_PROVIDER_SITE_OTHER): Payer: Self-pay | Admitting: Family Medicine

## 2010-08-16 VITALS — BP 131/76 | HR 83 | Ht 67.0 in | Wt 250.0 lb

## 2010-08-16 DIAGNOSIS — S83209A Unspecified tear of unspecified meniscus, current injury, unspecified knee, initial encounter: Secondary | ICD-10-CM

## 2010-08-16 DIAGNOSIS — F329 Major depressive disorder, single episode, unspecified: Secondary | ICD-10-CM

## 2010-08-16 DIAGNOSIS — D649 Anemia, unspecified: Secondary | ICD-10-CM

## 2010-08-16 DIAGNOSIS — IMO0002 Reserved for concepts with insufficient information to code with codable children: Secondary | ICD-10-CM

## 2010-08-16 MED ORDER — CITALOPRAM HYDROBROMIDE 20 MG PO TABS: 20.0000 mg | ORAL_TABLET | Freq: Every day | ORAL | Status: DC

## 2010-08-16 MED ORDER — HYDROCODONE-ACETAMINOPHEN 5-500 MG PO TABS: 1.0000 | ORAL_TABLET | Freq: Four times a day (QID) | ORAL | Status: DC | PRN

## 2010-08-16 NOTE — Patient Instructions (Signed)
Add Citalopram -- start with 1/2 tab one daily with dinner for the first wk then go up to a full tab.  Use vicodin 1-2 tabs every 6-8 hrs as needed for knee pain.  Alternate with Celebrex for pain (or Aleve).    You ultimately need to see ortho back for definitive treatment of L meniscal tear.  OK to use xanax as needed in the AM and Valium as needed at night.  F/U with Dr Lenise Arena.  Return for f/u in 2 mos.

## 2010-08-16 NOTE — Assessment & Plan Note (Signed)
Pt admits to depression along with her anxiety and panic attacks after months of [name] discussing treatment.  Will start Citalopram 10 mg once daily x 1 wk then go up to 20 mg/ day.  She is not a threat to herself or others.  Will call me if any problems.  Will continue just one tab of Xanax in the AM  As needed for anxiety.

## 2010-08-16 NOTE — Assessment & Plan Note (Signed)
Reviewed last labs with pt and asked her to stay on iron.

## 2010-08-16 NOTE — Assessment & Plan Note (Signed)
I read thru her notes from [place] [facility] (her 2nd opinion from the attorney).  She does indeed have a meniscal tear which is causing her pain.  Her obesity and lack of exercise to start with does not help.  I agree that the plan of care should involve an injection, PT and probably arthroscopic surgery with a long term plan to lose wt.  Will continue Celebrex 1-2 x a day and add Vicodin for severe pain for now.  She needs f/u with ortho.

## 2010-08-16 NOTE — Progress Notes (Signed)
  Subjective:    Patient ID: Ashley Gomez, female    DOB: [birth year], 40 y.o.   MRN: [id number]  HPI  40 yo WF presents for f/u visit.  She apparently fell at work, coming out of the [employer] where she worked at the time and fell onto both knees.  This was apparently back in June 2011.  She saw her orthopedist under workers comp, Dr Thomasena Edis in [place].  An MRI was done and it did show a meniscal tear, fraying and edema on the L side with some changes on the R also.  Due to knee pain, per her report, she was unable to adequately do her job and was later laid off.  She now has an attourney who is trying to get workers comp to pay for her treatment plain (PT, injection, arthroscopic surgery).  She saw an orthopedist in [place] under the direction of her attourney for a 2nd opinion and she agreed with this plan of care.  Unfortunately, Ashley Gomez are waiting to hear back and is having pain in her L> R knee and her Celebrex is not helping.  Able to walk with some discomfort.  Having a sensation of giving way, but it has not.  She feels like her knee is swollen.  She admits to being depressed with all that has gone one with her recently.  She has had a panic attack even after taking Xanax in the morning.  BP 131/76  Pulse 83  Ht 5\' 7"  (1.702 m)  Wt 250 lb (113.399 kg)  BMI 39.16 kg/m2  SpO2 97%  LMP 08/09/2010   Review of Systems  Constitutional: Negative for fatigue and unexpected weight change.  Gastrointestinal: Negative for constipation.  Musculoskeletal: Positive for myalgias, back pain, joint swelling, arthralgias and gait problem.  Psychiatric/Behavioral: Positive for sleep disturbance, dysphoric mood and decreased concentration. Negative for suicidal ideas and self-injury. The patient is nervous/anxious.        Objective:   Physical Exam  Constitutional: She appears well-developed and well-nourished. No distress.       obese  Cardiovascular: Normal rate, regular rhythm and normal  heart sounds.   No murmur heard. Pulmonary/Chest: Effort normal and breath sounds normal. She has no wheezes.  Musculoskeletal: She exhibits tenderness (L knee tender to palpation, esp at lateral joint line). She exhibits no edema.  Neurological:       Antalgic gait toward the r  Skin: Skin is warm and dry.  Psychiatric:       Flat affect          Assessment & Plan:

## 2010-08-30 ENCOUNTER — Other Ambulatory Visit: Payer: Self-pay | Admitting: Family Medicine

## 2010-08-31 ENCOUNTER — Telehealth: Payer: Self-pay | Admitting: *Deleted

## 2010-08-31 NOTE — Telephone Encounter (Signed)
Pt would like to know if she needs to stay on Rx Vit D and if so she needs refill sent to pharm.

## 2010-09-01 ENCOUNTER — Other Ambulatory Visit: Payer: Self-pay | Admitting: *Deleted

## 2010-09-01 MED ORDER — DIAZEPAM 10 MG PO TABS: 10.0000 mg | ORAL_TABLET | Freq: Every evening | ORAL | Status: DC | PRN

## 2010-09-01 MED ORDER — ALPRAZOLAM 1 MG PO TABS: 1.0000 mg | ORAL_TABLET | ORAL | Status: DC

## 2010-09-13 ENCOUNTER — Telehealth: Payer: Self-pay | Admitting: *Deleted

## 2010-09-13 DIAGNOSIS — S83209A Unspecified tear of unspecified meniscus, current injury, unspecified knee, initial encounter: Secondary | ICD-10-CM

## 2010-09-13 MED ORDER — HYDROCODONE-ACETAMINOPHEN 5-500 MG PO TABS: 1.0000 | ORAL_TABLET | Freq: Four times a day (QID) | ORAL | Status: DC | PRN

## 2010-09-13 NOTE — Telephone Encounter (Signed)
Pt states she is going thru a court hearing and is unable to go see ortho until end of June. Pt would like refill of pain med for legs. She is back to work and in pain. Pt would like to know if you will help her until her hearing. She also requested something a little stronger that she can take less often. Please advise.

## 2010-09-13 NOTE — Telephone Encounter (Signed)
I don't prescribe long acting narcotics.  I will fill her hydrocodone and celebrex till she sees ortho.

## 2010-09-28 ENCOUNTER — Other Ambulatory Visit: Payer: Self-pay | Admitting: Family Medicine

## 2010-10-04 ENCOUNTER — Telehealth: Payer: Self-pay | Admitting: Family Medicine

## 2010-10-04 NOTE — Telephone Encounter (Addendum)
Pt notified that Dr. Cathey Endow will not give anything pain med stronger than what she has already given.  Pt state, "I'll just call Marcelino Duster Dr. Ovidio Kin nurse tomorrow."  Explained to the patient that it will not change anything.  She will not get any stronger pain med given or prescribed.  Pt stated, "OK." Jarvis Newcomer, LPN [facility]

## 2010-10-04 NOTE — Telephone Encounter (Signed)
Pt called and has been using Vicodin 5/500 mg PO, however the order says 1-2 PO Q 6 hours PRN.  Pt is using 2 PO every 4 hours.  Told pt it is not ordered as such.  Pt in incredible pain due to fall she had on the job last year.  Has an attorney and is suing workman comp because she has had lots of problems since the fall.  Employer discharged, blood clot, shoulder, seizure problems.  Pt is requesting something stronger than Vicodin 5/500 mg. Plan:  Routed call for advice to Dr. Cathey Endow who is not in the office today.  Pt did state she had been calling around to try to get a ortho doctor and has been seen at [facility] in the past.  Dr. Cathey Endow last note said to take celebrex and vicodin for pain and to get an ortho provider. Jarvis Newcomer, LPN [facility]

## 2010-10-04 NOTE — Telephone Encounter (Signed)
It is the patient's responsibility to not take more than her prescribed amount of pain medication.  I am not going to change her to something stronger or give her more quantity as this has been explained to her in the past. It is her responsibility to f/u with ortho for her back.

## 2010-10-05 ENCOUNTER — Telehealth: Payer: Self-pay | Admitting: *Deleted

## 2010-10-05 NOTE — Telephone Encounter (Signed)
FYI- Pt called stating she thinks Darl Pikes misunderstood her yesterday. Pt is in lots of pain but she is taking med as Rxd. She wanted to know if we gave any pain injections here in our office. I explained to Pt that we did not have any strong pain meds that we give in our office and suggested Pt go to ED if she is in that much pain. Pt agreed but wanted Dr. Cathey Endow to know that the conversation w/ Darl Pikes was a misunderstanding.

## 2010-10-05 NOTE — Telephone Encounter (Signed)
She can not add any doctors until after her court hearing

## 2010-10-05 NOTE — Telephone Encounter (Signed)
OK.  Does she want to see pain mgmt long term?

## 2010-10-12 ENCOUNTER — Other Ambulatory Visit: Payer: Self-pay | Admitting: Family Medicine

## 2010-10-18 ENCOUNTER — Encounter: Payer: Self-pay | Admitting: Family Medicine

## 2010-10-18 ENCOUNTER — Ambulatory Visit (INDEPENDENT_AMBULATORY_CARE_PROVIDER_SITE_OTHER): Payer: Self-pay | Admitting: Family Medicine

## 2010-10-18 VITALS — BP 129/76 | HR 76 | Ht 67.0 in | Wt 249.0 lb

## 2010-10-18 DIAGNOSIS — IMO0002 Reserved for concepts with insufficient information to code with codable children: Secondary | ICD-10-CM

## 2010-10-18 DIAGNOSIS — S83209A Unspecified tear of unspecified meniscus, current injury, unspecified knee, initial encounter: Secondary | ICD-10-CM

## 2010-10-18 DIAGNOSIS — Z23 Encounter for immunization: Secondary | ICD-10-CM

## 2010-10-18 DIAGNOSIS — M7711 Lateral epicondylitis, right elbow: Secondary | ICD-10-CM

## 2010-10-18 DIAGNOSIS — M7710 Lateral epicondylitis, unspecified elbow: Secondary | ICD-10-CM

## 2010-10-18 NOTE — Progress Notes (Signed)
  Subjective:    Patient ID: Ashley Gomez, female    DOB: [birth year], 40 y.o.   MRN: [id number]  HPI 40 yo WF presents for continued L knee pain.  She has yet to see ortho back.  She has to wait to go to court for a hearing on June 25th before she gets back in with ortho.  She is seeing Dr Thomasena Edis and Dr Ulyses Amor.    She went to the ED in [place] for R elbow pain last night.  She started to have pain 2 wks ago.  She has an ulcer so cannot take NSAIDs.  She had an xray done that was normal.  Denies swelling, redness, bruising or trauma.  Denies tingling or numbness but feels a bit weaker than normal.  Denies numbness into the hand but pain does radiate down the forearm.  She has RX for vicodin.  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  BP 129/76  Pulse 76  Ht 5\' 7"  (1.702 m)  Wt 249 lb (112.946 kg)  BMI 39.00 kg/m2  SpO2 96%  LMP 09/27/2010    Review of Systems as per HPI    Objective:   Physical Exam  Constitutional: She appears well-developed and well-nourished.  Neck: Normal range of motion.  Cardiovascular: Normal rate, regular rhythm and normal heart sounds.   Pulmonary/Chest: Effort normal and breath sounds normal. No respiratory distress. She has no wheezes.  Musculoskeletal: She exhibits tenderness (tender at lateral olecranon process, worsened by R wrist supination and extenssion). She exhibits no edema.       Grip + 5/5 bilat, full cspine and glenohumeral ROM  Neurological: She displays normal reflexes.  Skin: Skin is warm and dry.  Psychiatric: She has a normal mood and affect.          Assessment & Plan:

## 2010-10-18 NOTE — Patient Instructions (Signed)
Referral made to ortho downstairs for lateral epicondylitis and knee pain. No change made to meds. Use ice 15 min on and off and avoid repetitive activities. Wear ChoPat strap. Return for f/u iron deficiency in 2 mos. Epicondylitis, Lateral (Tennis Elbow) with Rehab  Lateral epicondylitis involves inflammation and pain around the outer portion of the elbow. The pain is caused by inflammation of the tendons in the forearm that  bring back (extend) the wrist. Lateral epicondylittis is also called tennis elbow, because it is very common in tennis players. However, it may occur in any individual who extends the wrist repetitively. If lateral epicondylitis is left untreated, it may become a chronic problem.   SYMPTOMS  Pain, tenderness, and inflammation on the outer (lateral) side of the elbow.  Pain or weakness with gripping activities.  Pain that increases with wrist twisting motions (playing tennis, using a screwdriver, opening a door or a jar).  Pain with lifting objects, including a coffee cup.   CAUSES  Lateral epicondylitis is caused by inflammation of the tendons that extend the wrist. Causes of injury may include:  Repetitive stress and strain on the muscles and tendons that extend the wrist.  Sudden change in activity level or intensity.  Incorrect grip in racquet sports.  Incorrect grip size of racquet (often too large).  Incorrect hitting position or technique (usually backhand, leading with the elbow).  Using a racket that is too heavy.   RISK INCREASES WITH   Sports or occupations that require repetitive and/or strenuous forearm and wrist movements (tennis, squash, racquetball, carpentry).  Poor wrist and forearm strength and flexibility.   Failure to warm up properly before activity.  Resuming activity before healing, rehabilitation, and conditioning are complete.   PREVENTIVE MEASURES   Warm up and stretch properly before activity.  Maintain physical  fitness: l Strength, flexibility, and endurance. l Cardiovascular fitness.  Wear and use properly fitted equipment.  Learn and use proper technique and have a coach correct improper technique.  Wear a tennis elbow (counterforce) brace.   PROGNOSIS The course of this condition depends on the degree of the injury. If treated properly, acute cases (symptoms lasting less than 4 weeks) are often resolved in 2 to 6 weeks. Chronic (longer lasting cases) often resolve in 3 to 6 months, but may require physical therapy.   POSSIBLE COMPLICATIONS   Frequently recurring symptoms, resulting in a chronic problem. Properly treating the problem the first time decreases frequency of recurrence.  Chronic inflammation, scarring tendon degeneration, and partial tendon tear, requiring surgery.  Delayed healing or resolution of symptoms.   GENERAL TREATMENT CONSIDERATIONS  Treatment first involves the use of ice and medicine, to reduce pain and inflammation. Strengthening and stretching exercises may help reduce discomfort, if performed regularly. These exercises may be performed at home, if the condition is an acute injury. Chronic cases may require a referral to a physical therapist for evaluation and treatment. Your caregiver may advise a corticosteroid injection, to help reduce inflammation. Rarely, surgery is needed.   MEDICATION:   If pain medicine is needed, nonsteroidal anti-inflammatory medicines (aspirin and ibuprofen), or other minor pain relievers (acetaminophen), are often advised.   Do not take pain medicine for 7 days before surgery.   Prescription pain relievers may be given, if your caregiver thinks they are needed. Use only as directed and only as much as you need.  Corticosteroid injections may be recommended. These injections should be reserved only for the most severe cases, because they can  only be given a certain number of times.   HEAT AND COLD:   Cold treatment (icing) should  be applied for 10 to 15 minutes every 2 to 3 hours for inflammation and pain, and immediately after activity that aggravates your symptoms. Use ice packs or an ice massage.  Heat treatment may be used before performing stretching and strengthening activities prescribed by your caregiver, physical therapist, or athletic trainer. Use a heat pack or a warm water soak.     SEEK MEDICAL CARE IF:  Symptoms get worse or do not improve in 2 weeks, despite treatment.     EXERCISES   RANGE OF MOTION AND STRETCHING EXERCISES - Epicondylitis, Lateral (Tennis Elbow) These exercises may help you when beginning to rehabilitate your injury.  Your symptoms may go away with or without further involvement from your physician, physical therapist or athletic trainer. While completing these exercises, remember:   Restoring tissue flexibility helps normal motion to return to the joints. This allows healthier, less painful movement and activity.  An effective stretch should be held for at least 30 seconds.  A stretch should never be painful. You should only feel a gentle lengthening or release in the stretched tissue.    RANGE OF MOTION - Wrist Flexion, Active-Assisted  Extend your __________ elbow with your fingers pointing down.*   Gently pull the back of your hand towards you, until you feel a gentle stretch on the top of your forearm.   Hold this position for __________ seconds.  Repeat __________ times.  Complete this exercise __________ times per day.    *If directed by your physician, physical therapist or athletic trainer, complete this stretch with your elbow bent, rather than extended.     RANGE OF MOTION - Wrist Extension, Active-Assisted   Extend your __________ elbow and turn your palm upwards.*  Gently pull your palm and fingertips back, so your wrist extends and your fingers point more toward the ground.    You should feel a gentle stretch on the inside of your forearm.  Hold this  position for __________ seconds.  Repeat __________ times. Complete this exercise __________ times per day. *If directed by your physician, physical therapist or athletic trainer, complete this stretch with your elbow bent, rather than extended.     STRETCH - Wrist Flexion   Place the back of your __________ hand on a tabletop, leaving your elbow slightly bent. Your fingers should point away from your body.  Gently press the back of your hand down onto the table by straightening your elbow. You should feel a stretch on the top of your forearm.   Hold this position for __________ seconds.  Repeat __________ times. Complete this stretch __________ times per day.     STRETCH - Wrist Extension   Place your __________ fingertips on a tabletop, leaving your elbow slightly bent. Your fingers should point backwards.  Gently press your fingers and palm down onto the table by straightening your elbow. You should feel a stretch on the inside of your forearm.   Hold this position for __________ seconds.  Repeat __________ times. Complete this stretch __________ times per day.      STRENGTHENING EXERCISES - Epicondylitis, Lateral (Tennis Elbow) These exercises may help you when beginning to rehabilitate your injury. They may resolve your symptoms with or without further involvement from your physician, physical therapist or athletic trainer. While completing these exercises, remember:   Muscles can gain both the endurance and the strength needed for  everyday activities through controlled exercises.  Complete these exercises as instructed by your physician, physical therapist or athletic trainer. Increase the resistance and repetitions only as guided.  You may experience muscle soreness or fatigue, but the pain or discomfort you are trying to eliminate should never worsen during these exercises. If this pain does get worse, stop and make sure you are following the directions exactly. If the pain is  still present after adjustments, discontinue the exercise until you can discuss the trouble with your caregiver.     STRENGTH - Wrist Flexors  Sit with your __________ forearm palm-up and fully supported on a table or countertop. Your elbow should be resting below the height of your shoulder. Allow your wrist to extend over the edge of the surface.   Loosely holding a __________ pound weight, or a piece of rubber exercise band or tubing, slowly curl your hand up toward your forearm.    Hold this position for __________ seconds. Slowly lower the wrist back to the starting position in a controlled manner. Repeat __________ times. Complete this exercise __________ times per day.      STRENGTH - Wrist Extensors  Sit with your __________ forearm palm-down and fully supported on a table or countertop. Your elbow should be resting below the height of your shoulder. Allow your wrist to extend over the edge of the surface.  Loosely holding a __________ pound weight, or a piece of rubber exercise band or tubing, slowly curl your hand up toward your forearm.    Hold this position for __________ seconds. Slowly lower the wrist back to the starting position in a controlled manner. Repeat __________ times. Complete this exercise __________ times per day.      STRENGTH - Ulnar Deviators  Stand with a ____________________ pound weight in your __________ hand, or sit while holding a rubber exercise band or tubing, with your healthy arm supported on a table or countertop.  Move your wrist, so that your pinkie travels toward your forearm and your thumb moves away from your forearm.  Hold this position for __________ seconds and then slowly lower the wrist back to the starting position. Repeat __________ times. Complete this exercise __________ times per day     STRENGTH - Radial Deviators  Stand with a __________ pound weight in your __________ hand, or sit while holding a rubber exercise band or  tubing, with your injured arm supported on a table or countertop.  Raise your hand upward in front of you or pull up on the rubber tubing.  Hold this position for time301 seconds and then slowly lower the wrist back to the starting position.  Repeat __________ times. Complete this exercise __________ times per day.     STRENGTH - Forearm Supinators   Sit with your __________ forearm supported on a table, keeping your elbow below shoulder height.  Rest your hand over the edge, palm down.   Gently grip a __________ oz. hammer or a soup ladle.   Without moving your elbow, slowly turn your palm and hand upward to a "thumbs-up" position.  Hold this position for __________ seconds. Slowly return to the starting position.  Repeat __________ times. Complete this exercise __________ times per day.     STRENGTH - Forearm Pronators   Sit with your __________ forearm supported on a table, keeping your elbow below shoulder height. Rest your hand over the edge, palm up.  Gently grip a __________ oz. hammer or a soup ladle.   Without moving  your elbow, slowly turn your palm and hand upward to a "thumbs-up" position.  Hold this position for __________ seconds.  Slowly return to the starting position.  Repeat __________ times. Complete this exercise __________ times per day.     STRENGTH - Grip   Grasp a tennis ball, a dense sponge, or a large, rolled sock in your hand.  Squeeze as hard as you can, without increasing any pain.  Hold this position for __________ seconds. Release your grip slowly. Repeat __________ times. Complete this exercise __________ times per day.    STRENGTH - Elbow Extensors, Isometric   Stand or sit upright, on a firm surface. Place your __________ arm so that your palm faces your stomach, and it is at the height of your waist.  Place your opposite hand on the underside of your forearm. Gently push up as your __________ arm resists. Push as hard as you can with  both arms, without causing any pain or movement at your __________ elbow.  Hold this stationary position for __________ seconds. Gradually release the tension in both arms. Allow your muscles to relax completely before repeating.   Document Released: 05/01/2005  Document Re-Released: 02/25/2009 [facility] Patient Information 2011 [place], [place].

## 2010-10-18 NOTE — Assessment & Plan Note (Signed)
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  Will treat with relative rest if possible, a cho pat strap and ice.  Cannot take NSAIDs given an ulcer.  I will refer her to sports med to follow - may need injection if not improving and she wants to see them for her knee anyway.

## 2010-10-18 NOTE — Assessment & Plan Note (Signed)
Her treatment plan has been hindered by the court system.  She wants to use her own insurance to see ortho here for treament.  She has RX for vicodin prn pain.

## 2010-10-31 ENCOUNTER — Other Ambulatory Visit: Payer: Self-pay | Admitting: Family Medicine

## 2010-11-07 ENCOUNTER — Other Ambulatory Visit: Payer: Self-pay | Admitting: Family Medicine

## 2010-11-14 ENCOUNTER — Other Ambulatory Visit: Payer: Self-pay | Admitting: Family Medicine

## 2010-11-24 ENCOUNTER — Ambulatory Visit (INDEPENDENT_AMBULATORY_CARE_PROVIDER_SITE_OTHER): Payer: Self-pay | Admitting: Family Medicine

## 2010-11-24 ENCOUNTER — Encounter: Payer: Self-pay | Admitting: Family Medicine

## 2010-11-24 VITALS — BP 141/89 | HR 75 | Temp 98.4°F | Wt 253.0 lb

## 2010-11-24 DIAGNOSIS — J40 Bronchitis, not specified as acute or chronic: Secondary | ICD-10-CM

## 2010-11-24 MED ORDER — AMOXICILLIN 875 MG PO TABS: 875.0000 mg | ORAL_TABLET | Freq: Two times a day (BID) | ORAL | Status: AC

## 2010-11-24 MED ORDER — HYDROCODONE-HOMATROPINE 5-1.5 MG/5ML PO SYRP: 5.0000 mL | ORAL_SOLUTION | Freq: Four times a day (QID) | ORAL | Status: AC | PRN

## 2010-11-24 MED ORDER — AZITHROMYCIN 250 MG PO TABS: ORAL_TABLET | ORAL | Status: DC

## 2010-11-24 NOTE — Patient Instructions (Signed)
Take Zithromax x 5 days for bronchitis.  Avoid smoking.  Use Xopenex inhaler 2 puffs 3-4 x a day for wheezing x 7 days then use as needed.  Use Hycodan for cough as needed.  Take plain Mucinex every 12 hrs and tylenol for aches and pains.  Call if not improved in 7-10 days.

## 2010-11-24 NOTE — Progress Notes (Signed)
  Subjective:    Patient ID: Ashley Gomez, female    DOB: May 06, 1971, 40 y.o.   MRN: [id number]  HPI  40 yo WF presents for 6 days of feeling bad.  Started with a HA and head congestion.  She has no rhinorrhea.  She is coughing to the point of vomitting at night.  achey all over and back pain with coughing.  She has throat irritation and postnasal drip.  She is using Dayquil and Nyquil.  She was sent home from work yesterday.  She has a little chest tightness but no SOB.  She ran a fever the first few days.  She is trying not to smoke.  Her cough is just dry.  BP 141/89  Pulse 75  Temp(Src) 98.4 F (36.9 C) (Oral)  Wt 253 lb (114.76 kg)  SpO2 98%   Review of Systems  Constitutional: Positive for fever and fatigue. Negative for chills.  HENT: Positive for congestion and postnasal drip. Negative for sore throat and sinus pressure.   Respiratory: Positive for cough and chest tightness. Negative for shortness of breath.   Cardiovascular: Negative for chest pain.  Gastrointestinal: Negative for nausea, abdominal pain and diarrhea.  Neurological: Positive for headaches.       Objective:   Physical Exam  Constitutional: She appears well-developed and well-nourished.  HENT:  Nose: Nose normal.  Mouth/Throat: Oropharynx is clear and moist.       O/p injected, head congestion with yellow rhinorrhea  Eyes: Conjunctivae are normal. Left eye exhibits no discharge.  Cardiovascular: Normal rate, regular rhythm and normal heart sounds.   No murmur heard. Pulmonary/Chest: Effort normal and breath sounds normal. No respiratory distress. She has no wheezes.       Dry hacking cough  Lymphadenopathy:    She has cervical adenopathy.  Skin: Skin is warm and dry.          Assessment & Plan:  Bronchitis in a smoker :  Will treat with Amoxicillin 875 mg bid x 10 day, avoid smoking.  Hycodan for cough not to combine with her Vicodin.  She can use plain Mucinex and tylenol for cough/ aches/ pains.   Rest, clear fluids and out of work note given.  Call if not resolved in 10 days.

## 2010-11-29 ENCOUNTER — Other Ambulatory Visit: Payer: Self-pay | Admitting: Family Medicine

## 2010-12-15 ENCOUNTER — Other Ambulatory Visit: Payer: Self-pay | Admitting: Family Medicine

## 2011-01-03 ENCOUNTER — Encounter: Payer: Self-pay | Admitting: Family Medicine

## 2011-01-03 ENCOUNTER — Telehealth: Payer: Self-pay | Admitting: Family Medicine

## 2011-01-03 ENCOUNTER — Ambulatory Visit
Admission: RE | Admit: 2011-01-03 | Discharge: 2011-01-03 | Disposition: A | Discharge status: Home / Self Care | Payer: Self-pay | Source: Ambulatory Visit | Attending: Family Medicine | Admitting: Family Medicine

## 2011-01-03 ENCOUNTER — Ambulatory Visit (INDEPENDENT_AMBULATORY_CARE_PROVIDER_SITE_OTHER): Payer: Self-pay | Admitting: Family Medicine

## 2011-01-03 VITALS — BP 143/97 | HR 84 | Ht 67.0 in | Wt 247.0 lb

## 2011-01-03 DIAGNOSIS — E559 Vitamin D deficiency, unspecified: Secondary | ICD-10-CM

## 2011-01-03 DIAGNOSIS — G43909 Migraine, unspecified, not intractable, without status migrainosus: Secondary | ICD-10-CM

## 2011-01-03 DIAGNOSIS — R05 Cough: Secondary | ICD-10-CM

## 2011-01-03 DIAGNOSIS — R5381 Other malaise: Secondary | ICD-10-CM

## 2011-01-03 DIAGNOSIS — R059 Cough, unspecified: Secondary | ICD-10-CM

## 2011-01-03 DIAGNOSIS — R5383 Other fatigue: Secondary | ICD-10-CM

## 2011-01-03 DIAGNOSIS — F329 Major depressive disorder, single episode, unspecified: Secondary | ICD-10-CM

## 2011-01-03 DIAGNOSIS — I82409 Acute embolism and thrombosis of unspecified deep veins of unspecified lower extremity: Secondary | ICD-10-CM

## 2011-01-03 DIAGNOSIS — I10 Essential (primary) hypertension: Secondary | ICD-10-CM

## 2011-01-03 DIAGNOSIS — E611 Iron deficiency: Secondary | ICD-10-CM

## 2011-01-03 DIAGNOSIS — E538 Deficiency of other specified B group vitamins: Secondary | ICD-10-CM

## 2011-01-03 DIAGNOSIS — M7989 Other specified soft tissue disorders: Secondary | ICD-10-CM

## 2011-01-03 MED ORDER — ATENOLOL-CHLORTHALIDONE 50-25 MG PO TABS: 1.0000 | ORAL_TABLET | Freq: Every day | ORAL | Status: DC

## 2011-01-03 MED ORDER — KETOROLAC TROMETHAMINE 60 MG/2ML IJ SOLN
60.0000 mg | Freq: Once | INTRAMUSCULAR | Status: AC
  Administered 2011-01-03: 60 mg via INTRAMUSCULAR

## 2011-01-03 MED ORDER — CITALOPRAM HYDROBROMIDE 40 MG PO TABS: 40.0000 mg | ORAL_TABLET | Freq: Every day | ORAL | Status: DC

## 2011-01-03 MED ORDER — ACETAMINOPHEN-CODEINE 300-30 MG PO TABS: ORAL_TABLET | ORAL | Status: DC

## 2011-01-03 MED ORDER — PREDNISONE 20 MG PO TABS: ORAL_TABLET | ORAL | Status: DC

## 2011-01-03 NOTE — Assessment & Plan Note (Signed)
Still having problems with chronic fatigue, anxiety and poor sleep.  Valium not helping at night and cannot afford seroquel.  Will first try her on a higher dose of Citalopram daily and f/u in 2-3 mos.

## 2011-01-03 NOTE — Patient Instructions (Addendum)
CXR and labs today. Will call you w/ results tomorrow.  I changed your Atenolol to Atenolol - Chlorthalidone once daily.  Go up on Citalopram to 40 mg once daily for mood/ sleep.  I changed your Vicodin to Tylenol #3 for pain and cough since you were not due for Vicodin RX yet.  Return for f/u mood in 6 wks.

## 2011-01-03 NOTE — Assessment & Plan Note (Signed)
BP is still high.  Will change her Atenolol to Atenolol - Chlorthalidone daily.  Call if any problems.  RTC for f/u in 3 mos.

## 2011-01-03 NOTE — Progress Notes (Signed)
  Subjective:    Patient ID: Ashley Gomez, female    DOB: [birth year], 40 y.o.   MRN: [id number]  HPI  40 yo WF presents for f/u visit.  She is still coughing after seen in July for bronchitis.  She took 10 days of Amoxicillin.  She does continue to smoke.  Not taking anything OTC.  Has some chest tightness and SOB.  No fevers, chills or sputum production or hemopytsis.  Cough has worsened her migraines and she has one today.  She is taking her BP meds as directed.  She has hx of DVT from early 2011 and is worried that her L leg and L arm have felt swollen and painful lately.  She is off coumadin.  BP 143/97  Pulse 84  Ht 5\' 7"  (1.702 m)  Wt 247 lb (112.038 kg)  BMI 38.69 kg/m2  SpO2 97%  PF 460 L/min  LMP 12/29/2010   Review of Systems  Constitutional: Positive for fatigue. Negative for fever.  HENT: Negative for sinus pressure.   Respiratory: Positive for cough, chest tightness, shortness of breath and wheezing.   Cardiovascular: Positive for leg swelling. Negative for chest pain and palpitations.  Gastrointestinal: Negative for nausea, abdominal pain and diarrhea.  Musculoskeletal: Positive for back pain.  Neurological: Positive for headaches.  Psychiatric/Behavioral: Positive for dysphoric mood. The patient is nervous/anxious.        Objective:   Physical Exam  Constitutional: She appears well-developed and well-nourished. No distress.       obese  HENT:  Mouth/Throat: Oropharynx is clear and moist.  Eyes: Conjunctivae are normal. No scleral icterus.  Neck: Neck supple.  Cardiovascular: Normal rate, regular rhythm and normal heart sounds.   Pulmonary/Chest: Effort normal and breath sounds normal. No respiratory distress. She has no wheezes. She has no rales.       Dry cough  Musculoskeletal: She exhibits edema (1+ nonpitting leg edema).  Lymphadenopathy:    She has no cervical adenopathy.  Skin: Skin is warm and dry.  Psychiatric:       Flat affect            Assessment & Plan:

## 2011-01-03 NOTE — Assessment & Plan Note (Signed)
Continues to cough > 6 wks, smoker.  Will get labs and a CXR today.  Treat with abx only if CXR is + for infiltrate.  Plan to update PFTs to look for COPD.  She has an inhaler and can use the Tylenol #3 for both cough and pain.

## 2011-01-03 NOTE — Telephone Encounter (Signed)
Pls let pt know that her CXR came back normal other than mild peribronchial thickening from smoking.  I will start her on Prednisone 40 mg once daily for 5 days.  If cough persists after this, she needs to come back for PFTs to screen for COPD/ asthma.

## 2011-01-03 NOTE — Assessment & Plan Note (Signed)
Hx of LUE DVT with post clot thrombophlebitis.  Will check a D dimer though her w/u for hypercoag state was negative, so she is not considered high risk.

## 2011-01-04 ENCOUNTER — Telehealth: Payer: Self-pay | Admitting: Family Medicine

## 2011-01-04 LAB — CBC WITH DIFFERENTIAL/PLATELET
Basophils Absolute: 0 10*3/uL (ref 0.0–0.1)
Basophils Relative: 0 % (ref 0–1)
Eosinophils Absolute: 0.1 10*3/uL (ref 0.0–0.7)
Eosinophils Relative: 2 % (ref 0–5)
HCT: 49.1 % — ABNORMAL HIGH (ref 36.0–46.0)
Hemoglobin: 15.6 g/dL — ABNORMAL HIGH (ref 12.0–15.0)
Lymphocytes Relative: 23 % (ref 12–46)
Lymphs Abs: 1.6 10*3/uL (ref 0.7–4.0)
MCH: 30.1 pg (ref 26.0–34.0)
MCHC: 31.8 g/dL (ref 30.0–36.0)
MCV: 94.6 fL (ref 78.0–100.0)
Monocytes Absolute: 0.6 10*3/uL (ref 0.1–1.0)
Monocytes Relative: 8 % (ref 3–12)
Neutro Abs: 4.5 10*3/uL (ref 1.7–7.7)
Neutrophils Relative %: 67 % (ref 43–77)
Platelets: 280 10*3/uL (ref 150–400)
RBC: 5.19 MIL/uL — ABNORMAL HIGH (ref 3.87–5.11)
RDW: 13.1 % (ref 11.5–15.5)
WBC: 6.7 10*3/uL (ref 4.0–10.5)

## 2011-01-04 LAB — GLUCOSE, RANDOM: Glucose, Bld: 93 mg/dL (ref 70–99)

## 2011-01-04 LAB — D-DIMER, QUANTITATIVE: D-Dimer, Quant: 0.4 ug/mL-FEU (ref 0.00–0.48)

## 2011-01-04 LAB — VITAMIN D 25 HYDROXY (VIT D DEFICIENCY, FRACTURES): Vit D, 25-Hydroxy: 26 ng/mL — ABNORMAL LOW (ref 30–89)

## 2011-01-04 LAB — VITAMIN B12: Vitamin B-12: 396 pg/mL (ref 211–911)

## 2011-01-04 LAB — IRON AND TIBC
%SAT: 43 % (ref 20–55)
Iron: 153 ug/dL — ABNORMAL HIGH (ref 42–145)
TIBC: 360 ug/dL (ref 250–470)
UIBC: 207 ug/dL

## 2011-01-04 MED ORDER — VITAMIN D3 1.25 MG (50000 UT) PO CAPS: 50000.0000 [IU] | ORAL_CAPSULE | ORAL | Status: DC

## 2011-01-04 NOTE — Telephone Encounter (Signed)
LMOM for Pt to CB 

## 2011-01-04 NOTE — Telephone Encounter (Signed)
Pt aware of the above  

## 2011-01-04 NOTE — Telephone Encounter (Signed)
Pt aware.

## 2011-01-04 NOTE — Telephone Encounter (Signed)
Pls let pt know that her D dimer was normal (negative for a blood clot).  Her iron is a little high so she does not need to take any iron.  Her B12 is a little low and her Vit D is low.  Her blood counts and sugar are normal.  Will start her on RX Vitamin D once a wk.  She needs to take OTC Vit D 1,000 IU/ day in addition to this and I would suggest doing B12 injections once a month for the next 3 mos.

## 2011-01-09 ENCOUNTER — Telehealth: Payer: Self-pay | Admitting: *Deleted

## 2011-01-09 ENCOUNTER — Ambulatory Visit (INDEPENDENT_AMBULATORY_CARE_PROVIDER_SITE_OTHER): Payer: Self-pay | Admitting: Family Medicine

## 2011-01-09 ENCOUNTER — Other Ambulatory Visit: Payer: Self-pay | Admitting: Family Medicine

## 2011-01-09 VITALS — BP 140/86 | HR 88

## 2011-01-09 DIAGNOSIS — E538 Deficiency of other specified B group vitamins: Secondary | ICD-10-CM

## 2011-01-09 MED ORDER — HYDROCODONE-ACETAMINOPHEN 5-325 MG PO TABS: 1.0000 | ORAL_TABLET | Freq: Four times a day (QID) | ORAL | Status: AC | PRN

## 2011-01-09 MED ORDER — CYANOCOBALAMIN 1000 MCG/ML IJ SOLN
1000.0000 ug | INTRAMUSCULAR | Status: DC
  Administered 2011-01-09 – 2012-01-03 (×2): 1000 ug via INTRAMUSCULAR

## 2011-01-09 NOTE — Telephone Encounter (Signed)
Evidently pt still waiting for workers comp to approve ortho referral to will change back to vicodin.

## 2011-01-09 NOTE — Progress Notes (Signed)
  Subjective:    Patient ID: Ashley Gomez, female    DOB: 1971-03-05, 40 y.o.   MRN: [id number]  HPI Vit B12 inj needed.     Review of Systems     Objective:   Physical Exam        Assessment & Plan:

## 2011-01-09 NOTE — Telephone Encounter (Signed)
Pt states Dr. Leonard Schwartz changed vicodin to tylenol 3 but she is unable to take tylenol 3 bc it makes her sick. She states this happened when she was in her teens but thought she out grew the SEs. Can this be changed back. Please advise.

## 2011-01-12 ENCOUNTER — Other Ambulatory Visit: Payer: Self-pay | Admitting: Family Medicine

## 2011-01-19 ENCOUNTER — Ambulatory Visit
Admission: RE | Admit: 2011-01-19 | Discharge: 2011-01-19 | Disposition: A | Discharge status: Home / Self Care | Payer: Self-pay | Source: Ambulatory Visit | Attending: Family Medicine | Admitting: Family Medicine

## 2011-01-19 ENCOUNTER — Other Ambulatory Visit: Payer: Self-pay | Admitting: Family Medicine

## 2011-01-19 ENCOUNTER — Inpatient Hospital Stay (INDEPENDENT_AMBULATORY_CARE_PROVIDER_SITE_OTHER)
Admission: RE | Admit: 2011-01-19 | Discharge: 2011-01-19 | Disposition: A | Discharge status: Home / Self Care | Payer: Self-pay | Source: Ambulatory Visit | Attending: Family Medicine | Admitting: Family Medicine

## 2011-01-19 ENCOUNTER — Encounter: Payer: Self-pay | Admitting: Family Medicine

## 2011-01-19 DIAGNOSIS — M79669 Pain in unspecified lower leg: Secondary | ICD-10-CM

## 2011-01-19 DIAGNOSIS — M722 Plantar fascial fibromatosis: Secondary | ICD-10-CM

## 2011-01-19 DIAGNOSIS — M79609 Pain in unspecified limb: Secondary | ICD-10-CM

## 2011-01-19 DIAGNOSIS — M76829 Posterior tibial tendinitis, unspecified leg: Secondary | ICD-10-CM

## 2011-01-25 ENCOUNTER — Telehealth (INDEPENDENT_AMBULATORY_CARE_PROVIDER_SITE_OTHER): Payer: Self-pay | Admitting: *Deleted

## 2011-02-06 ENCOUNTER — Other Ambulatory Visit: Payer: Self-pay | Admitting: Family Medicine

## 2011-02-07 ENCOUNTER — Encounter: Payer: Self-pay | Admitting: Family Medicine

## 2011-02-14 ENCOUNTER — Encounter: Payer: Self-pay | Admitting: Family Medicine

## 2011-02-14 ENCOUNTER — Ambulatory Visit (INDEPENDENT_AMBULATORY_CARE_PROVIDER_SITE_OTHER): Payer: Self-pay | Admitting: Family Medicine

## 2011-02-14 VITALS — BP 130/90 | HR 80 | Wt 253.0 lb

## 2011-02-14 DIAGNOSIS — F329 Major depressive disorder, single episode, unspecified: Secondary | ICD-10-CM

## 2011-02-14 DIAGNOSIS — G8929 Other chronic pain: Secondary | ICD-10-CM

## 2011-02-14 DIAGNOSIS — Z23 Encounter for immunization: Secondary | ICD-10-CM

## 2011-02-14 DIAGNOSIS — F411 Generalized anxiety disorder: Secondary | ICD-10-CM

## 2011-02-14 DIAGNOSIS — E538 Deficiency of other specified B group vitamins: Secondary | ICD-10-CM

## 2011-02-14 MED ORDER — ALPRAZOLAM 1 MG PO TABS: 1.0000 mg | ORAL_TABLET | ORAL | Status: DC

## 2011-02-14 MED ORDER — DIAZEPAM 10 MG PO TABS: 10.0000 mg | ORAL_TABLET | Freq: Every evening | ORAL | Status: DC | PRN

## 2011-02-14 MED ORDER — FLUOXETINE HCL 20 MG PO TABS: 20.0000 mg | ORAL_TABLET | Freq: Every day | ORAL | Status: DC

## 2011-02-14 MED ORDER — CYANOCOBALAMIN 1000 MCG/ML IJ SOLN
1000.0000 ug | Freq: Once | INTRAMUSCULAR | Status: AC
  Administered 2011-02-14: 1000 ug via INTRAMUSCULAR

## 2011-02-14 NOTE — Progress Notes (Addendum)
Subjective:    Patient ID: Ashley Gomez, female    DOB: 04-Sep-1970, 40 y.o.   MRN: [id number]  HPI Mood - Went up on the citalopram to 40mg  and she felt extremely tired so just stopped it.  Says she still feels anxious. Sleep is fair. She would like  A RF on her valium which she uses at night.   She also takes topamax for her migraines but feels it is not really helping. Says has been to [facility] and has tried lots of different tx.   HTN - Says takes her med very regularly. No recent changes. Says normally her BP is well controlled.    Review of Systems  BP 130/90  Pulse 80  Wt 253 lb (114.76 kg)    Allergies  Allergen Reactions  . Ace Inhibitors     REACTION: cough  . Codeine Nausea Only    Past Medical History  Diagnosis Date  . Hypertension   . Depression   . Anxiety   . Seizures   . MS (multiple sclerosis)   . Obesity   . Smoking   . Migraines   . Facet hypertrophy of lumbar region     MRI 2007  . DVT (deep venous thrombosis) 12/13/2009    L basilic vein   . CVA (cerebral infarction)     Old CVA on MRI brain    Past Surgical History  Procedure Date  . Tubal ligation 1992  . Tonsillectomy     History   Social History  . Marital Status: Single    Spouse Name: N/A    Number of Children: N/A  . Years of Education: N/A   Occupational History  . Not on file.   Social History Main Topics  . Smoking status: Current Everyday Smoker -- 1.0 packs/day for 25 years    Types: Cigarettes  . Smokeless tobacco: Not on file  . Alcohol Use: Yes     occasional  . Drug Use: No  . Sexually Active:    Other Topics Concern  . Not on file   Social History Narrative  . No narrative on file    Family History  Problem Relation Age of Onset  . Cancer Mother 30    melanoma  . Hypertension Sister   . Heart disease Sister     valve disease  . Depression Sister   . Diabetes Sister     We administered cyanocobalamin.     Objective:   Physical Exam    Constitutional: She is oriented to person, place, and time. She appears well-developed and well-nourished.  HENT:  Head: Normocephalic and atraumatic.  Cardiovascular: Normal rate, regular rhythm and normal heart sounds.   Pulmonary/Chest: Effort normal and breath sounds normal.  Neurological: She is alert and oriented to person, place, and time.  Skin: Skin is warm and dry.  Psychiatric: She has a normal mood and affect. Her behavior is normal.          Assessment & Plan:  Depressed - PHQ- 9 score of 18 today.  OFfered to try a different medication. She thinks she has tried zoloft in the past as well. Will try fluoxetine. Discussed potential SE. F/U in 3 weeks.   HTN- BP es elevated today. DBP is high.   Chronic Pain- she also feel her meds for her pain is no longer working Says it is wearing off after about 2 hours. I recommend pain management as I don't rx extenede release products. Pt is  agreeable.   B12 def- given injection today  Flu vac given.

## 2011-03-13 ENCOUNTER — Encounter: Payer: Self-pay | Admitting: Family Medicine

## 2011-03-13 ENCOUNTER — Ambulatory Visit (INDEPENDENT_AMBULATORY_CARE_PROVIDER_SITE_OTHER): Payer: Self-pay | Admitting: Family Medicine

## 2011-03-13 DIAGNOSIS — F32A Depression, unspecified: Secondary | ICD-10-CM

## 2011-03-13 DIAGNOSIS — I10 Essential (primary) hypertension: Secondary | ICD-10-CM

## 2011-03-13 DIAGNOSIS — F419 Anxiety disorder, unspecified: Secondary | ICD-10-CM

## 2011-03-13 DIAGNOSIS — F411 Generalized anxiety disorder: Secondary | ICD-10-CM

## 2011-03-13 DIAGNOSIS — F329 Major depressive disorder, single episode, unspecified: Secondary | ICD-10-CM

## 2011-03-13 MED ORDER — DIAZEPAM 10 MG PO TABS: 10.0000 mg | ORAL_TABLET | Freq: Every evening | ORAL | Status: DC | PRN

## 2011-03-13 MED ORDER — HYDROCODONE-ACETAMINOPHEN 5-325 MG PO TABS: 1.0000 | ORAL_TABLET | Freq: Four times a day (QID) | ORAL | Status: DC | PRN

## 2011-03-13 MED ORDER — ALPRAZOLAM 1 MG PO TABS: 1.0000 mg | ORAL_TABLET | ORAL | Status: DC

## 2011-03-13 NOTE — Progress Notes (Signed)
  Subjective:    Patient ID: Ashley Gomez, female    DOB: [birth year], 40 y.o.   MRN: [id number]  HPI She is here to followup for depression today. She says she's in a lot of pain today which is why her blood pressures elevated. She never started the fluoxetine. She said when she went to the pharmacy was going to cost $40 and she cannot afford this. She is currently without insurance. She does have a neurologist who sees her for her MS but she has been unable to go because of her lack of insurance. She's having significant weakness she says as well some vision changes. She is also having more frequent headaches. She also continues to smoke cigarettes and cells very heavily of cigarette smoke today. She feels her anxiety is a little bit worse and wants to know if she can go up on her Xanax. Note she also uses Valium but more as a muscle relaxer.  She was unable to go to pain management because she cannot make $150 copayments and she does not have insurance. She says she went to put this on hold. She is due for refill her narcotics today and would like that.  Review of Systems     Objective:   Physical Exam  Constitutional: She is oriented to person, place, and time. She appears well-developed and well-nourished.  HENT:  Head: Normocephalic and atraumatic.  Cardiovascular: Normal rate, regular rhythm and normal heart sounds.   Pulmonary/Chest: Effort normal and breath sounds normal.  Neurological: She is alert and oriented to person, place, and time.  Skin: Skin is warm and dry.  Psychiatric: She has a normal mood and affect. Her behavior is normal.          Assessment & Plan:  Depression/anxiety-Her PHQ 9 score is 19 today. She clearly is not well controlled. I strongly encouraged her to start the SSRI. I think it will help her mood as well as her anxiety. I explained to her how this medication can help to both depression and anxiety. Followup in 2 months. I also reassured her that this  medication can be obtained from several local pharmacies for only $4 per month. I also explained that I am not going to increase her anxiety medication as she is already taking it daily and really needs to be on a controller medication such as that is SSRI.  Hypertension-her BP is elevated today but she feels strongly is from her pain today. We will also adjusting her medication but ultimately 2 months we need to recheck this. If she is still not a pulmonologist or medication.

## 2011-03-15 ENCOUNTER — Telehealth: Payer: Self-pay | Admitting: Family Medicine

## 2011-03-15 NOTE — Telephone Encounter (Signed)
Patient called request to know if she can have a order to have her elbow x-rayed downstairs. Request a call back

## 2011-03-16 ENCOUNTER — Telehealth: Payer: Self-pay | Admitting: *Deleted

## 2011-03-16 DIAGNOSIS — IMO0002 Reserved for concepts with insufficient information to code with codable children: Secondary | ICD-10-CM

## 2011-03-16 NOTE — Telephone Encounter (Signed)
OK for xray or elbow.

## 2011-03-16 NOTE — Telephone Encounter (Signed)
Had a fall a year ago at work, when fell fell on both hands and knees. Has bothered her every since. Seen ortho downstairs and been using nitro on it said she had tennis elbow- says pain has gotten worse and would like an xray.

## 2011-03-16 NOTE — Telephone Encounter (Signed)
Xray order faxed downstairs

## 2011-03-17 ENCOUNTER — Ambulatory Visit (INDEPENDENT_AMBULATORY_CARE_PROVIDER_SITE_OTHER): Payer: PRIVATE HEALTH INSURANCE | Admitting: Family Medicine

## 2011-03-17 ENCOUNTER — Ambulatory Visit
Admission: RE | Admit: 2011-03-17 | Discharge: 2011-03-17 | Disposition: A | Discharge status: Home / Self Care | Payer: Self-pay | Source: Ambulatory Visit | Attending: Family Medicine | Admitting: Family Medicine

## 2011-03-17 DIAGNOSIS — IMO0002 Reserved for concepts with insufficient information to code with codable children: Secondary | ICD-10-CM

## 2011-03-17 DIAGNOSIS — E538 Deficiency of other specified B group vitamins: Secondary | ICD-10-CM

## 2011-03-17 MED ORDER — CYANOCOBALAMIN 1000 MCG/ML IJ SOLN
1000.0000 ug | Freq: Once | INTRAMUSCULAR | Status: AC
  Administered 2011-03-17: 1000 ug via INTRAMUSCULAR

## 2011-03-17 NOTE — Progress Notes (Signed)
  Subjective:    Patient ID: Ashley Gomez, female    DOB: 06-08-1970, 40 y.o.   MRN: [id number] B12 injection HPI    Review of Systems     Objective:   Physical Exam        Assessment & Plan:

## 2011-03-24 ENCOUNTER — Telehealth: Payer: Self-pay | Admitting: *Deleted

## 2011-03-24 NOTE — Telephone Encounter (Signed)
Atenolol is a beta-blocker not an ACEi. Totally diff family of meds and different mechanism of action. I double checked and there is not contraindication betwenn codeine which is a narcotic and atenolol.

## 2011-03-24 NOTE — Telephone Encounter (Signed)
Seen at [facility] and they told her that if allergic codeine you should not be takeing the Atenolol. Please advise. Also allergic to ace inhibitors and was told that the Atenolol was an ace inhibitor by [facility]

## 2011-03-28 NOTE — Telephone Encounter (Signed)
Pt.notified

## 2011-04-14 ENCOUNTER — Ambulatory Visit (INDEPENDENT_AMBULATORY_CARE_PROVIDER_SITE_OTHER): Payer: PRIVATE HEALTH INSURANCE | Admitting: Family Medicine

## 2011-04-14 VITALS — BP 140/89 | HR 90

## 2011-04-14 DIAGNOSIS — E538 Deficiency of other specified B group vitamins: Secondary | ICD-10-CM

## 2011-04-14 MED ORDER — HYDROCODONE-ACETAMINOPHEN 5-325 MG PO TABS: 1.0000 | ORAL_TABLET | Freq: Four times a day (QID) | ORAL | Status: DC | PRN

## 2011-04-14 MED ORDER — DIAZEPAM 10 MG PO TABS: 10.0000 mg | ORAL_TABLET | Freq: Every evening | ORAL | Status: DC | PRN

## 2011-04-14 MED ORDER — ALPRAZOLAM 1 MG PO TABS: 1.0000 mg | ORAL_TABLET | ORAL | Status: DC

## 2011-04-14 MED ORDER — CYANOCOBALAMIN 1000 MCG/ML IJ SOLN
1000.0000 ug | INTRAMUSCULAR | Status: DC
  Administered 2011-04-14: 1000 ug via INTRAMUSCULAR

## 2011-04-14 NOTE — Progress Notes (Signed)
  Subjective:    Patient ID: Ashley Gomez, female    DOB: 10/23/1970, 40 y.o.   MRN: [id number]  HPI Here for monthly B12 injection    Review of Systems     Objective:   Physical Exam        Assessment & Plan:

## 2011-04-17 NOTE — Progress Notes (Signed)
Summary: Leg Pain right rm 4   Vital Signs:  Patient Profile:   39 Years Old Female CC:      RT foot, leg and ankle pain x 3-4 days Height:     66 inches Weight:      253 pounds O2 Sat:      98 % O2 treatment:    Room Air Temp:     98.3 degrees F oral Pulse rate:   87 / minute Resp:     20 per minute BP sitting:   140 / 87  (right arm) Cuff size:   large  Vitals Entered By: Clemens Catholic LPN (January 19, 2011 10:12 AM)                  Updated Prior Medication List: TOPAMAX 100 MG TABS (TOPIRAMATE) Take 1 tab by mouth two times a day DIAZEPAM 10 MG TABS (DIAZEPAM) 1 tab by mouth at bedtime as needed muscle spasm ALPRAZOLAM 1 MG TABS (ALPRAZOLAM) 1 tab by mouth qAM as needed anxiety BENTYL 20 MG TABS (DICYCLOMINE HCL) Take one tablet by mouth three times a day FERROUS SULFATE 325 (65 FE) MG TABS (FERROUS SULFATE) 1 tab by mouth once daily ATENOLOL 50 MG TABS (ATENOLOL) 1 tab by mouth daily * VITAMIN D 50,000 IU CAPSULE 1 capsule by mouth once a week NORCO 10-325 MG TABS (HYDROCODONE-ACETAMINOPHEN)  CELEXA 20 MG TABS (CITALOPRAM HYDROBROMIDE)  VITAMIN B-12 100 MCG TABS (CYANOCOBALAMIN)   Current Allergies: ! DILANTIN ACE INHIBITORSHistory of Present Illness Chief Complaint: RT foot, leg and ankle pain x 3-4 days History of Present Illness:  Subjective:  Patient complains of onset of pain in her right foot arch 3 days ago that gradually migrated to the right heel, and has now developed pain in her right anterior and posterior leg.  She has pain with weight-bearing.  She has noticed mild swelling in the right lower leg.  She recalls no trauma to her right foot/leg, and no recent change in activities.  No shortness of breath or chest pain.  No fevers, chills, and sweats  She has a past history of DVT in her left leg last year, having discontinued Coumadin January of this year.  REVIEW OF SYSTEMS Constitutional Symptoms      Denies fever, chills, night sweats, weight  loss, weight gain, and fatigue.  Eyes       Complains of change in vision.      Denies eye pain, eye discharge, glasses, contact lenses, and eye surgery. Ear/Nose/Throat/Mouth       Denies hearing loss/aids, change in hearing, ear pain, ear discharge, dizziness, frequent runny nose, frequent nose bleeds, sinus problems, sore throat, hoarseness, and tooth pain or bleeding.  Respiratory       Complains of dry cough and bronchitis.      Denies productive cough, wheezing, shortness of breath, asthma, and emphysema/COPD.  Cardiovascular       Denies murmurs, chest pain, and tires easily with exhertion.    Gastrointestinal       Complains of stomach pain and diarrhea.      Denies nausea/vomiting, constipation, blood in bowel movements, and indigestion. Genitourniary       Denies painful urination, kidney stones, and loss of urinary control. Neurological       Complains of headaches, numbness, and tingling.      Denies paralysis, seizures, and fainting/blackouts. Musculoskeletal       Complains of muscle pain, joint pain, decreased range of motion,  redness, and swelling.      Denies joint stiffness, muscle weakness, and gout.  Skin       Denies bruising, unusual mles/lumps or sores, and hair/skin or nail changes.  Psych       Complains of mood changes, anxiety/stress, and depression.      Denies temper/anger issues, speech problems, and sleep problems. Other Comments: pt c/o RT ankle leg, ankle, foot pain and swelling x 3-4 days. no injury.  she has not taken any IBF due to a hx of stomach ulcers.   Past History:  Past Medical History: ? MS --  HTN obesity smoking Depression/ Anxiety Migraines Lumbar facet hypertrophy; MRI 07 Old CVA on MRI brain L basilic vein  DVT 12-2009 seizures hx of blood disorder/clots  Neuro Dr Mickie Kay. heme Dr Abbe Amsterdam  Past Surgical History: Reviewed history from 03/09/2009 and no changes required. BTL 1992 Tonsillectomy  Family  History: Reviewed history from 03/09/2009 and no changes required. identical twin sister -- HTN, valve disease.  diabetes, depression. mom melanoma at 30s 2 half brothers  No MS in the family father unknown  Social History: Reviewed history from 03/09/2009 and no changes required. 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   Occas ETOH. 5+ coffee/ day. 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  does some walking. Poor diet. Weight stable x 3 yrs.   Objective:  Obese middle aged female in no acute distress  Eyes:  Pupils are equal, round, and reactive to light and accomodation.  Extraocular movement is intact.  Conjunctivae are not inflamed.  Neck:  Supple.   Lungs:  Clear to auscultation.  Breath sounds are equal.  Heart:  Regular rate and rhythm without murmurs, rubs, or gallops.  Abdomen:  Nontender without masses or hepatosplenomegaly.  Bowel sounds are present.  No CVA or flank tenderness.  Right leg:  mild swelling lower leg with faint erythema.  Mild tenderness over the distal calf extending to achilles tendon insertion.  Homans test negative Right ankle/foot:  Tenderness over extensor tedons.  Tenderness in arch.  Distinct tenderness over plantar surface of heel. Lower extremity ultrasound right leg:  IMPRESSION: No evidence of deep venous thrombosis of the right lower extremity. Assessment New Problems: TENDINITIS TIBIALIS (ICD-726.72) PLANTAR FASCIITIS, RIGHT (ICD-728.71) LEG PAIN, RIGHT (ICD-729.5)   Plan New Medications/Changes: NORCO 5-325 MG TABS (HYDROCODONE-ACETAMINOPHEN) One by mouth hs as needed pain  #12 (twelve) x 0, 01/19/2011, Donna Christen MD  New Orders: Lower Extremity Ultrasound with Doppler [Sono LE w/Doppler] New Patient Level V [99205] Planning Comments:   Recommend Voltaren gel (has Rx at home).  Recommend shoe inserts with heel cup and adequate arch.  Begin applying ice pack several times daily.  Obtain night splint.  Begin stretching exercises  ([facility] information and instruction patient handouts given)  Analgesic at bedtime. Followup with [facility] if not improved in two weeks.  Return (or proceed to ER) for increasing leg/calf pain   The patient and/or caregiver has been counseled thoroughly with regard to medications prescribed including dosage, schedule, interactions, rationale for use, and possible side effects and they verbalize understanding.  Diagnoses and expected course of recovery discussed and will return if not improved as expected or if the condition worsens. Patient and/or caregiver verbalized understanding.  Prescriptions: NORCO 5-325 MG TABS (HYDROCODONE-ACETAMINOPHEN) One by mouth hs as needed pain  #12 (twelve) x 0   Entered and Authorized by:   Donna Christen MD   Signed by:  Donna Christen MD on 01/19/2011   Method used:   Print then Give to Patient   RxID:   [id number]   Patient Instructions: 1)  Recommend night splints (order online), and well fitting heels cups with arch support.  Orders Added: 1)  Lower Extremity Ultrasound with Doppler [Sono LE w/Doppler] 2)  New Patient Level V [13086]

## 2011-04-17 NOTE — Telephone Encounter (Signed)
  Phone Note Outgoing Call   Call placed by: Clemens Catholic LPN,  January 25, 2011 2:39 PM Summary of Call: call back: pt states that her leg is about the same, she states that [facility] would not fill the rx for pain med bc she already had pain meds filled recently. she is going to check [facility] with [facility] to see when pain med can be filled. advised her if she fails to improve she will need to f/u with sports med, pt agrees. Initial call taken by: Clemens Catholic LPN,  January 25, 2011 2:41 PM

## 2011-05-12 ENCOUNTER — Other Ambulatory Visit: Payer: Self-pay | Admitting: *Deleted

## 2011-05-12 MED ORDER — DIAZEPAM 10 MG PO TABS: 10.0000 mg | ORAL_TABLET | Freq: Every evening | ORAL | Status: DC | PRN

## 2011-05-12 MED ORDER — HYDROCODONE-ACETAMINOPHEN 5-325 MG PO TABS
1.0000 | ORAL_TABLET | Freq: Four times a day (QID) | ORAL | Status: DC | PRN
Start: 2011-05-12 — End: 2011-05-29

## 2011-05-12 MED ORDER — ALPRAZOLAM 1 MG PO TABS: 1.0000 mg | ORAL_TABLET | ORAL | Status: DC

## 2011-05-17 ENCOUNTER — Ambulatory Visit (INDEPENDENT_AMBULATORY_CARE_PROVIDER_SITE_OTHER): Payer: PRIVATE HEALTH INSURANCE | Admitting: Family Medicine

## 2011-05-17 ENCOUNTER — Encounter: Payer: Self-pay | Admitting: Family Medicine

## 2011-05-17 VITALS — BP 121/75 | HR 75 | Wt 254.0 lb

## 2011-05-17 DIAGNOSIS — T304 Corrosion of unspecified body region, unspecified degree: Secondary | ICD-10-CM

## 2011-05-17 DIAGNOSIS — T300 Burn of unspecified body region, unspecified degree: Secondary | ICD-10-CM

## 2011-05-17 DIAGNOSIS — F341 Dysthymic disorder: Secondary | ICD-10-CM

## 2011-05-17 DIAGNOSIS — IMO0002 Reserved for concepts with insufficient information to code with codable children: Secondary | ICD-10-CM

## 2011-05-17 DIAGNOSIS — F418 Other specified anxiety disorders: Secondary | ICD-10-CM

## 2011-05-17 DIAGNOSIS — D510 Vitamin B12 deficiency anemia due to intrinsic factor deficiency: Secondary | ICD-10-CM

## 2011-05-17 MED ORDER — CYANOCOBALAMIN 1000 MCG/ML IJ SOLN
1000.0000 ug | Freq: Once | INTRAMUSCULAR | Status: AC
  Administered 2011-05-17: 1000 ug via INTRAMUSCULAR

## 2011-05-17 MED ORDER — SILVER SULFADIAZINE 1 % EX CREA: TOPICAL_CREAM | Freq: Every day | CUTANEOUS | Status: DC
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
## 2011-05-29 ENCOUNTER — Telehealth: Payer: Self-pay | Admitting: *Deleted

## 2011-05-29 MED ORDER — DIAZEPAM 10 MG PO TABS: 10.0000 mg | ORAL_TABLET | Freq: Every evening | ORAL | Status: DC | PRN

## 2011-05-29 MED ORDER — ALPRAZOLAM 1 MG PO TABS: 1.0000 mg | ORAL_TABLET | ORAL | Status: DC

## 2011-05-29 MED ORDER — HYDROCODONE-ACETAMINOPHEN 5-325 MG PO TABS: 1.0000 | ORAL_TABLET | Freq: Four times a day (QID) | ORAL | Status: DC | PRN

## 2011-05-29 NOTE — Telephone Encounter (Signed)
OK to refill. Please print and I will sign.
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
## 2011-06-26 ENCOUNTER — Other Ambulatory Visit: Payer: Self-pay | Admitting: *Deleted

## 2011-06-26 MED ORDER — CELECOXIB 200 MG PO CAPS: 200.0000 mg | ORAL_CAPSULE | Freq: Two times a day (BID) | ORAL | Status: DC

## 2011-06-29 ENCOUNTER — Encounter: Payer: Self-pay | Admitting: Family Medicine

## 2011-06-29 ENCOUNTER — Ambulatory Visit (INDEPENDENT_AMBULATORY_CARE_PROVIDER_SITE_OTHER): Payer: PRIVATE HEALTH INSURANCE | Admitting: Family Medicine

## 2011-06-29 DIAGNOSIS — D510 Vitamin B12 deficiency anemia due to intrinsic factor deficiency: Secondary | ICD-10-CM

## 2011-06-29 MED ORDER — ALPRAZOLAM 1 MG PO TABS: 1.0000 mg | ORAL_TABLET | ORAL | Status: DC

## 2011-06-29 MED ORDER — CYANOCOBALAMIN 1000 MCG/ML IJ SOLN
1000.0000 ug | Freq: Once | INTRAMUSCULAR | Status: AC
  Administered 2011-06-29: 1000 ug via INTRAMUSCULAR

## 2011-06-29 MED ORDER — DIAZEPAM 10 MG PO TABS: 10.0000 mg | ORAL_TABLET | Freq: Every evening | ORAL | Status: DC | PRN

## 2011-06-29 MED ORDER — HYDROCODONE-ACETAMINOPHEN 5-325 MG PO TABS: 1.0000 | ORAL_TABLET | Freq: Four times a day (QID) | ORAL | Status: DC | PRN

## 2011-06-29 NOTE — Progress Notes (Signed)
  Subjective:    Patient ID: Ashley Gomez, female    DOB: 1971/02/22, 41 y.o.   MRN: [id number]  HPI  Pt here for B12 injection  Review of Systems     Objective:   Physical Exam        Assessment & Plan:  Not seen by Physician at this time.

## 2011-07-04 ENCOUNTER — Encounter: Payer: Self-pay | Admitting: Physician Assistant

## 2011-07-04 ENCOUNTER — Ambulatory Visit (INDEPENDENT_AMBULATORY_CARE_PROVIDER_SITE_OTHER): Payer: PRIVATE HEALTH INSURANCE | Admitting: Physician Assistant

## 2011-07-04 VITALS — BP 142/86 | HR 82 | Temp 97.9°F | Wt 256.0 lb

## 2011-07-04 DIAGNOSIS — J209 Acute bronchitis, unspecified: Secondary | ICD-10-CM

## 2011-07-04 MED ORDER — HYDROCODONE-HOMATROPINE 5-1.5 MG/5ML PO SYRP: 2.5000 mL | ORAL_SOLUTION | Freq: Four times a day (QID) | ORAL | Status: AC | PRN

## 2011-07-04 MED ORDER — AZITHROMYCIN 250 MG PO TABS: ORAL_TABLET | ORAL | Status: AC

## 2011-07-04 NOTE — Patient Instructions (Signed)
Start Zpak. Use hycodan at night. Continue to cough. Avoid smoking. Consider quiting.

## 2011-07-04 NOTE — Progress Notes (Signed)
  Subjective:    Patient ID: Ashley Gomez, female    DOB: 03-Sep-1970, 41 y.o.   MRN: [id number]  HPI Patient presents to clinic with cough for 6 days. She has continued to get worse. She reports SOB and wheezing mostly at night and having trouble sleeping because of the cough.she does smoke. She denies any fever but felt hot Sunday night. She has had sinus pressure and congestion. She has went through 2 bottle of Robtussin and nyquil with minimal relief. Her cough is productive. She has not had throat or ear pain.    Review of Systems     Objective:   Physical Exam  Constitutional: She is oriented to person, place, and time. She appears well-developed and well-nourished.  HENT:  Head: Normocephalic and atraumatic.  Right Ear: External ear normal.  Left Ear: External ear normal.  Nose: Nose normal.       TM's normal bilaterally. Negative for maxillary tenderness, positive for frontal tenderness.  Oropharynx erythematous.  Eyes: Conjunctivae are normal.  Neck: Normal range of motion. Neck supple.  Cardiovascular: Normal rate, regular rhythm and normal heart sounds.   Pulmonary/Chest: Effort normal.       Dry hacking cough.  Neurological: She is alert and oriented to person, place, and time.  Skin: Skin is warm and dry.  Psychiatric: She has a normal mood and affect. Her behavior is normal.          Assessment & Plan:  Bronchitis- Zpack given. Hycodan for cough can be used at night. Avoid smoking and consider stopping smoking. Follow up if not improving by Friday.

## 2011-07-07 ENCOUNTER — Telehealth: Payer: Self-pay | Admitting: *Deleted

## 2011-07-07 NOTE — Telephone Encounter (Signed)
Pt states that she doesn't feel like the zpack has worked at all and that she has less than half of the cough syrup left. please advise

## 2011-07-07 NOTE — Telephone Encounter (Signed)
Zpak is still working up to 7 days after finishing taking the prescription. I gave cough syrup does she not have any left. I will prescribe one more refill if needed.

## 2011-07-07 NOTE — Telephone Encounter (Signed)
Pt states her symptoms are not any better and she only has 1 day left of the zpack. Would like to know if you will call in something else. States she is not sleeping also due to the cough. Please advise.

## 2011-07-10 MED ORDER — METHYLPREDNISOLONE 4 MG PO KIT: PACK | ORAL | Status: AC

## 2011-07-10 NOTE — Telephone Encounter (Signed)
Pt informed

## 2011-07-10 NOTE — Telephone Encounter (Signed)
Sent over prednisone taper to start.

## 2011-07-26 ENCOUNTER — Other Ambulatory Visit: Payer: Self-pay | Admitting: Family Medicine

## 2011-07-27 ENCOUNTER — Other Ambulatory Visit: Payer: Self-pay | Admitting: *Deleted

## 2011-07-27 MED ORDER — DIAZEPAM 10 MG PO TABS: 10.0000 mg | ORAL_TABLET | Freq: Every evening | ORAL | Status: DC | PRN

## 2011-07-27 MED ORDER — HYDROCODONE-ACETAMINOPHEN 5-325 MG PO TABS: 1.0000 | ORAL_TABLET | Freq: Four times a day (QID) | ORAL | Status: DC | PRN

## 2011-07-27 MED ORDER — ALPRAZOLAM 1 MG PO TABS: 1.0000 mg | ORAL_TABLET | ORAL | Status: DC

## 2011-07-27 NOTE — Telephone Encounter (Signed)
Pt request refills of xanax,valium,hydrocodone. Its ok to fill xanax and valium? They are do today but didn't know if she supposed to be on both

## 2011-07-27 NOTE — Telephone Encounter (Signed)
OK to fill both. I dont' really like her being on 2 dif benzo but she uses her xanax in AM adn her valium at pm

## 2011-08-16 ENCOUNTER — Ambulatory Visit (INDEPENDENT_AMBULATORY_CARE_PROVIDER_SITE_OTHER): Payer: PRIVATE HEALTH INSURANCE | Admitting: Family Medicine

## 2011-08-16 ENCOUNTER — Encounter: Payer: Self-pay | Admitting: Family Medicine

## 2011-08-16 VITALS — BP 155/89 | HR 104 | Ht 67.0 in | Wt 262.0 lb

## 2011-08-16 DIAGNOSIS — E611 Iron deficiency: Secondary | ICD-10-CM

## 2011-08-16 DIAGNOSIS — J329 Chronic sinusitis, unspecified: Secondary | ICD-10-CM

## 2011-08-16 DIAGNOSIS — I10 Essential (primary) hypertension: Secondary | ICD-10-CM

## 2011-08-16 DIAGNOSIS — Q232 Congenital mitral stenosis: Secondary | ICD-10-CM

## 2011-08-16 DIAGNOSIS — Z86718 Personal history of other venous thrombosis and embolism: Secondary | ICD-10-CM

## 2011-08-16 DIAGNOSIS — E559 Vitamin D deficiency, unspecified: Secondary | ICD-10-CM

## 2011-08-16 DIAGNOSIS — M79603 Pain in arm, unspecified: Secondary | ICD-10-CM

## 2011-08-16 DIAGNOSIS — D510 Vitamin B12 deficiency anemia due to intrinsic factor deficiency: Secondary | ICD-10-CM

## 2011-08-16 DIAGNOSIS — M79609 Pain in unspecified limb: Secondary | ICD-10-CM

## 2011-08-16 MED ORDER — CYANOCOBALAMIN 1000 MCG/ML IJ SOLN
1000.0000 ug | Freq: Once | INTRAMUSCULAR | Status: AC
  Administered 2011-08-16: 1000 ug via INTRAMUSCULAR

## 2011-08-16 MED ORDER — AMOXICILLIN-POT CLAVULANATE 875-125 MG PO TABS: 1.0000 | ORAL_TABLET | Freq: Two times a day (BID) | ORAL | Status: AC

## 2011-08-16 NOTE — Progress Notes (Signed)
  Subjective:    Patient ID: Ashley Gomez, female    DOB: Dec 16, 1970, 41 y.o.   MRN: [id number]  HPI Seen for bronchitis about 2 months ago. Felt better for about a week but got worse a week ago.  Mild low grade fever. + HA.  Pain and pressure on the right side of face. Cough is dry.  No wheezing.  No SOB.    Hx of DVT in her left upper arm and left knee.  Says recently started feeling burning in her left upper arm and says similar to when had the DVT.  Has been off coumadin for one year.  She had similar symptoms in August and had a negative d-dimer at that point in time. She denies any chest pain or short of breath.  Review of Systems     Objective:   Physical Exam  Constitutional: She is oriented to person, place, and time. She appears well-developed and well-nourished.  HENT:  Head: Normocephalic and atraumatic.  Right Ear: External ear normal.  Left Ear: External ear normal.  Nose: Nose normal.  Mouth/Throat: Oropharynx is clear and moist.       TMs and canals are clear. Tender over the right maxillary sinus  Eyes: Conjunctivae and EOM are normal. Pupils are equal, round, and reactive to light.  Neck: Neck supple. No thyromegaly present.  Cardiovascular: Normal rate, regular rhythm and normal heart sounds.   Pulmonary/Chest: Effort normal and breath sounds normal. She has no wheezes.  Lymphadenopathy:    She has no cervical adenopathy.  Neurological: She is alert and oriented to person, place, and time.  Skin: Skin is warm and dry.  Psychiatric: She has a normal mood and affect.    There is no swelling in her left extremity.      Assessment & Plan:  Sinusitis - likely bacterial. Will treat with Augmentin. She was on a Z-Pak approximately one month ago. Call if not significantly better in 7-10 days. Continue symptomatic care as needed.  Elevated BP - Recheck in 1 months.  She says her blood pressure is high because she's not been sleeping well and has been taking cough and  cold medicines.  Pain in the upper left arm - Will check d-dimer. If negative then unlikely to be a DVT. Positive wanted to schedule her for an upper extremity ultrasound.  Vitamin D deficiency-we'll recheck today. She has not been taking any supplements.  Pernicious anemia-we'll recheck today and go ahead and get a B12 shot.

## 2011-08-16 NOTE — Patient Instructions (Signed)

## 2011-08-17 ENCOUNTER — Ambulatory Visit: Payer: PRIVATE HEALTH INSURANCE | Admitting: Family Medicine

## 2011-08-18 LAB — D-DIMER, QUANTITATIVE: D-Dimer, Quant: 0.46 ug/mL-FEU (ref 0.00–0.48)

## 2011-08-19 LAB — LIPID PANEL
Cholesterol: 180 mg/dL (ref 0–200)
HDL: 48 mg/dL (ref >39)
LDL Cholesterol: 99 mg/dL (ref 0–99)
Total CHOL/HDL Ratio: 3.8 Ratio
Triglycerides: 164 mg/dL — ABNORMAL HIGH (ref <150)
VLDL: 33 mg/dL (ref 0–40)

## 2011-08-19 LAB — COMPLETE METABOLIC PANEL WITH GFR
ALT: 11 U/L (ref 0–35)
AST: 15 U/L (ref 0–37)
Albumin: 4.2 g/dL (ref 3.5–5.2)
Alkaline Phosphatase: 56 U/L (ref 39–117)
BUN: 13 mg/dL (ref 6–23)
CO2: 25 mEq/L (ref 19–32)
Calcium: 8.6 mg/dL (ref 8.4–10.5)
Chloride: 105 mEq/L (ref 96–112)
Creat: 0.81 mg/dL (ref 0.50–1.10)
GFR, Est African American: >89 mL/min
GFR, Est Non African American: >89 mL/min
Glucose, Bld: 97 mg/dL (ref 70–99)
Potassium: 4.7 mEq/L (ref 3.5–5.3)
Sodium: 138 mEq/L (ref 135–145)
Total Bilirubin: 0.3 mg/dL (ref 0.3–1.2)
Total Protein: 6.4 g/dL (ref 6.0–8.3)

## 2011-08-19 LAB — CBC
HCT: 45.3 % (ref 36.0–46.0)
Hemoglobin: 14.6 g/dL (ref 12.0–15.0)
MCH: 28.6 pg (ref 26.0–34.0)
MCHC: 32.2 g/dL (ref 30.0–36.0)
MCV: 88.6 fL (ref 78.0–100.0)
Platelets: 274 10*3/uL (ref 150–400)
RBC: 5.11 MIL/uL (ref 3.87–5.11)
RDW: 13.3 % (ref 11.5–15.5)
WBC: 5.7 10*3/uL (ref 4.0–10.5)

## 2011-08-19 LAB — VITAMIN B12: Vitamin B-12: 1032 pg/mL — ABNORMAL HIGH (ref 211–911)

## 2011-08-19 LAB — IRON: Iron: 44 ug/dL (ref 42–145)

## 2011-08-19 LAB — VITAMIN D 25 HYDROXY (VIT D DEFICIENCY, FRACTURES): Vit D, 25-Hydroxy: 23 ng/mL — ABNORMAL LOW (ref 30–89)

## 2011-08-19 LAB — FERRITIN: Ferritin: 13 ng/mL (ref 10–291)

## 2011-08-24 ENCOUNTER — Other Ambulatory Visit: Payer: Self-pay | Admitting: *Deleted

## 2011-08-24 MED ORDER — HYDROCODONE-ACETAMINOPHEN 5-325 MG PO TABS: 1.0000 | ORAL_TABLET | Freq: Four times a day (QID) | ORAL | Status: DC | PRN

## 2011-08-24 MED ORDER — DIAZEPAM 10 MG PO TABS: 10.0000 mg | ORAL_TABLET | Freq: Every evening | ORAL | Status: DC | PRN

## 2011-08-24 MED ORDER — ALPRAZOLAM 1 MG PO TABS: 1.0000 mg | ORAL_TABLET | ORAL | Status: DC

## 2011-09-05 ENCOUNTER — Telehealth: Payer: Self-pay | Admitting: Family Medicine

## 2011-09-05 NOTE — Telephone Encounter (Signed)
Patient called to f/u on her pain management referral and find out what they said about her not having any insurance. Well, I do not no longer see a order for her to go to pain management. Its like it disappeared from Referrals? Can you enter another order so i can complete this for the patient? Thanks, [employer]

## 2011-09-15 ENCOUNTER — Ambulatory Visit: Payer: PRIVATE HEALTH INSURANCE

## 2011-09-22 ENCOUNTER — Other Ambulatory Visit: Payer: Self-pay | Admitting: *Deleted

## 2011-09-22 MED ORDER — HYDROCODONE-ACETAMINOPHEN 5-325 MG PO TABS: 1.0000 | ORAL_TABLET | Freq: Four times a day (QID) | ORAL | Status: DC | PRN

## 2011-09-22 MED ORDER — ALPRAZOLAM 1 MG PO TABS: 1.0000 mg | ORAL_TABLET | ORAL | Status: DC

## 2011-09-22 MED ORDER — DIAZEPAM 10 MG PO TABS: 10.0000 mg | ORAL_TABLET | Freq: Every evening | ORAL | Status: DC | PRN

## 2011-10-11 ENCOUNTER — Ambulatory Visit: Payer: PRIVATE HEALTH INSURANCE

## 2011-10-16 ENCOUNTER — Ambulatory Visit (INDEPENDENT_AMBULATORY_CARE_PROVIDER_SITE_OTHER): Payer: PRIVATE HEALTH INSURANCE | Admitting: Physician Assistant

## 2011-10-16 ENCOUNTER — Encounter: Payer: Self-pay | Admitting: Physician Assistant

## 2011-10-16 VITALS — BP 127/82 | HR 90 | Ht 67.0 in | Wt 241.0 lb

## 2011-10-16 DIAGNOSIS — D510 Vitamin B12 deficiency anemia due to intrinsic factor deficiency: Secondary | ICD-10-CM

## 2011-10-16 DIAGNOSIS — G43009 Migraine without aura, not intractable, without status migrainosus: Secondary | ICD-10-CM

## 2011-10-16 DIAGNOSIS — Z79899 Other long term (current) drug therapy: Secondary | ICD-10-CM

## 2011-10-16 DIAGNOSIS — F411 Generalized anxiety disorder: Secondary | ICD-10-CM

## 2011-10-16 DIAGNOSIS — F3289 Other specified depressive episodes: Secondary | ICD-10-CM

## 2011-10-16 DIAGNOSIS — E559 Vitamin D deficiency, unspecified: Secondary | ICD-10-CM

## 2011-10-16 DIAGNOSIS — F329 Major depressive disorder, single episode, unspecified: Secondary | ICD-10-CM

## 2011-10-16 LAB — IRON: Iron: 37 ug/dL — ABNORMAL LOW (ref 42–145)

## 2011-10-16 LAB — VITAMIN B12: Vitamin B-12: 633 pg/mL (ref 211–911)

## 2011-10-16 LAB — FERRITIN: Ferritin: 6 ng/mL — ABNORMAL LOW (ref 10–291)

## 2011-10-16 MED ORDER — FLUOXETINE HCL 20 MG PO TABS: 20.0000 mg | ORAL_TABLET | Freq: Every day | ORAL | Status: DC

## 2011-10-16 MED ORDER — FLUOXETINE HCL 40 MG PO CAPS: 40.0000 mg | ORAL_CAPSULE | Freq: Every day | ORAL | Status: DC

## 2011-10-16 MED ORDER — BUSPIRONE HCL 7.5 MG PO TABS: 7.5000 mg | ORAL_TABLET | Freq: Two times a day (BID) | ORAL | Status: DC

## 2011-10-16 NOTE — Progress Notes (Signed)
  Subjective:    Patient ID: Ashley Gomez, female    DOB: 09-Jun-1970, 41 y.o.   MRN: [id number]  HPI Patient comes in to follow up on ferritin, iron, b12 and vit D. She has been taking 1000 units daily OTC of Vit D and Hemocyte 325 daily. She really doesn't notice much difference. Her b12 was high last visit but it was the day after her b12 shot. She has not had shot this month and wants to recheck her b12 level. She has been very fatigued and feels like her depression and anxiety is worsening. She has had some added stress in her life with her daughter now pregnant for the 3rd time and she is having to raise the other 2 children already. She did run out of prozac and didn't call about refill. She has tried almost every SSRI with no real benefit. She has no insurance and can't afford other antidepressant. She didn't feel like prozac was helping but only on 20mg  and for 1 month. She denies sucidial thoughts. She finds herself crying a lot. Lost 20lbs since august due to stress and just not eating.   She also has a migraine today. She has a hx of migraines on topamax for prevention. Pain is behind both eyes. Some nausea but no vomiting. NO GI symptoms. NO fever. NO auras. Tried ibuprofen not helping.     Review of Systems     Objective:   Physical Exam  Constitutional: She is oriented to person, place, and time. She appears well-developed and well-nourished.       Obese.  HENT:  Head: Normocephalic and atraumatic.  Cardiovascular: Normal rate, regular rhythm and normal heart sounds.   Pulmonary/Chest: Effort normal and breath sounds normal. She has no wheezes.  Neurological: She is alert and oriented to person, place, and time.  Skin: Skin is warm and dry.  Psychiatric:       Cried during encounter multiple times. Seems very frustrated and hopless.           Assessment & Plan:  Depression/Anxiety- GAD-7 was 21, PHQ-9 was 13. I discussed with her not stopping any medications cold [place].  I think she should give prozac another chance. She was on a low dose and did not give enough time. Start with prozac 20mg  for 7 days then increase to 40mg  daily. I would love to try cymbalta but cost is an issue. Also added Buspar bid. Follow up in 6 weeks.   Migraine- Gave shot of toradol 60mg . Patient is already on topamax for prevention. Buspar could possibly help with migraines also. Continue ibuprofen for residual pain.    Medication management/Pernicous Anemia/Vit D deficency- Will get labs today and call with results. Continue previous therapy until hear from office.

## 2011-10-16 NOTE — Patient Instructions (Addendum)
Gave Toradol for migraine. Increased prozac to 40mg  a day take 1/2 tab for 1 week. Encouraged patient to give 6-8 weeks to get time to see how it will work. Buspar added twice a day.

## 2011-10-17 LAB — VITAMIN D 25 HYDROXY (VIT D DEFICIENCY, FRACTURES): Vit D, 25-Hydroxy: 25 ng/mL — ABNORMAL LOW (ref 30–89)

## 2011-10-18 ENCOUNTER — Other Ambulatory Visit: Payer: Self-pay | Admitting: Physician Assistant

## 2011-10-18 MED ORDER — FERROUS FUMARATE 325 (106 FE) MG PO TABS: 1.0000 | ORAL_TABLET | Freq: Two times a day (BID) | ORAL | Status: DC

## 2011-10-18 MED ORDER — VITAMIN D 1000 UNITS PO TABS: 6000.0000 [IU] | ORAL_TABLET | Freq: Every day | ORAL | Status: DC

## 2011-10-18 NOTE — Progress Notes (Signed)
Refilled Vitamin D and Iron.

## 2011-10-23 ENCOUNTER — Other Ambulatory Visit: Payer: Self-pay | Admitting: *Deleted

## 2011-10-23 MED ORDER — HYDROCODONE-ACETAMINOPHEN 5-325 MG PO TABS: 1.0000 | ORAL_TABLET | Freq: Four times a day (QID) | ORAL | Status: DC | PRN

## 2011-10-23 MED ORDER — DIAZEPAM 10 MG PO TABS: 10.0000 mg | ORAL_TABLET | Freq: Every evening | ORAL | Status: DC | PRN

## 2011-10-23 MED ORDER — ALPRAZOLAM 1 MG PO TABS: 1.0000 mg | ORAL_TABLET | ORAL | Status: DC

## 2011-11-22 ENCOUNTER — Ambulatory Visit (INDEPENDENT_AMBULATORY_CARE_PROVIDER_SITE_OTHER): Payer: PRIVATE HEALTH INSURANCE | Admitting: Physician Assistant

## 2011-11-22 ENCOUNTER — Encounter: Payer: Self-pay | Admitting: Physician Assistant

## 2011-11-22 ENCOUNTER — Telehealth: Payer: Self-pay | Admitting: Physician Assistant

## 2011-11-22 VITALS — BP 148/96 | HR 97 | Ht 67.0 in | Wt 238.0 lb

## 2011-11-22 DIAGNOSIS — M791 Myalgia, unspecified site: Secondary | ICD-10-CM

## 2011-11-22 DIAGNOSIS — R7309 Other abnormal glucose: Secondary | ICD-10-CM

## 2011-11-22 DIAGNOSIS — I10 Essential (primary) hypertension: Secondary | ICD-10-CM

## 2011-11-22 DIAGNOSIS — IMO0001 Reserved for inherently not codable concepts without codable children: Secondary | ICD-10-CM

## 2011-11-22 DIAGNOSIS — R739 Hyperglycemia, unspecified: Secondary | ICD-10-CM

## 2011-11-22 DIAGNOSIS — E162 Hypoglycemia, unspecified: Secondary | ICD-10-CM

## 2011-11-22 DIAGNOSIS — M2550 Pain in unspecified joint: Secondary | ICD-10-CM

## 2011-11-22 LAB — GLUCOSE, POCT (MANUAL RESULT ENTRY): POC Glucose: 104 mg/dl — AB (ref 70–99)

## 2011-11-22 MED ORDER — HYDROCODONE-ACETAMINOPHEN 5-325 MG PO TABS: 1.0000 | ORAL_TABLET | Freq: Four times a day (QID) | ORAL | Status: DC | PRN

## 2011-11-22 MED ORDER — ATENOLOL 50 MG PO TABS: 50.0000 mg | ORAL_TABLET | Freq: Every day | ORAL | Status: DC

## 2011-11-22 MED ORDER — SUCRALFATE 1 G PO TABS: 1.0000 g | ORAL_TABLET | Freq: Four times a day (QID) | ORAL | Status: DC

## 2011-11-22 MED ORDER — ALPRAZOLAM 1 MG PO TABS: 1.0000 mg | ORAL_TABLET | ORAL | Status: DC

## 2011-11-22 MED ORDER — OMEPRAZOLE 40 MG PO CPDR: 40.0000 mg | DELAYED_RELEASE_CAPSULE | Freq: Every day | ORAL | Status: DC

## 2011-11-22 MED ORDER — DIAZEPAM 10 MG PO TABS: 10.0000 mg | ORAL_TABLET | Freq: Every evening | ORAL | Status: DC | PRN

## 2011-11-22 NOTE — Progress Notes (Signed)
  Subjective:    Patient ID: Ashley Gomez, female    DOB: Jun 28, 1970, 41 y.o.   MRN: [id number]  HPI Patient presents to clinic because she has been feeling hot and dizzy a lot more lately. She has been checking her sugar and it has been running 74-84. She has not had a problem with sugar in the past. She has not passed out. She denies any Chest pains, palpitations, or shortness of breath.  She wants autoimmune, RF, and Sed rate because her twin sister has been diagnosed with RA and because she is in constant pain she wants to make sure she does not have it. She tends to have a lot of the medical conditions her sister has.   Her epigastric pain has gotten worse lately. She is not on PPI because of cost. She has been taking her boyfriends prilosec and when she takes it her epigastric pain is better. Denies any nausea and vomiting, or fever.    Review of Systems     Objective:   Physical Exam  Constitutional: She is oriented to person, place, and time. She appears well-developed and well-nourished.       Overweight.  HENT:  Head: Normocephalic and atraumatic.  Cardiovascular: Normal rate, regular rhythm, normal heart sounds and intact distal pulses.   Pulmonary/Chest: Effort normal and breath sounds normal.  Abdominal: Soft. Bowel sounds are normal.       Epigastric tenderness.  Neurological: She is alert and oriented to person, place, and time.  Skin: Skin is warm and dry.  Psychiatric: She has a normal mood and affect. Her behavior is normal.          Assessment & Plan:  Low blood sugar-POC glucose 104. Reassured patient that I do not think the dizziness is blood sugar related. However, if she does feel like sugar is low the only treatment is frequent meals.   GERD-I know cost is an issue but gave rx for prilosec and carafate. Encouraged her to shop around for the best price. Example [facility]. Pt does not have insurance. Encouraged patient to stay on PPI and would help her  epigastric pain and forming of ulcer. I did tell her to contact office if starts to run a fever or having nausea or vomiting.   Myagis/arthralgis- ANA, ESR, RF factored order per patients request.

## 2011-11-22 NOTE — Telephone Encounter (Signed)
I know she states she cannot afford prescription. Can she get prilosec over the counter? If not go to [email or web address] and see if you can get some assistance.

## 2011-11-22 NOTE — Patient Instructions (Addendum)
Will check labs for autoimmune disorders. Need to start eating regular meals. STart back on atenolol today. Refilled atenolol.

## 2011-11-22 NOTE — Telephone Encounter (Signed)
#   disconnected. Will wait to hear from Pt

## 2011-11-28 LAB — SEDIMENTATION RATE: Sed Rate: 4 mm/hr (ref 0–22)

## 2011-11-28 NOTE — Telephone Encounter (Signed)
Pt called back and notified her of PA instructions. KG LPN

## 2011-11-29 LAB — RHEUMATOID FACTOR: Rhuematoid fact SerPl-aCnc: <10 IU/mL (ref <=14)

## 2011-11-29 LAB — ANA: Anti Nuclear Antibody(ANA): NEGATIVE

## 2011-12-21 ENCOUNTER — Ambulatory Visit (INDEPENDENT_AMBULATORY_CARE_PROVIDER_SITE_OTHER): Payer: Worker's Compensation | Admitting: Sports Medicine

## 2011-12-21 ENCOUNTER — Ambulatory Visit (INDEPENDENT_AMBULATORY_CARE_PROVIDER_SITE_OTHER): Payer: Worker's Compensation

## 2011-12-21 ENCOUNTER — Encounter: Payer: Self-pay | Admitting: Sports Medicine

## 2011-12-21 VITALS — BP 146/93 | HR 88 | Ht 67.0 in | Wt 250.0 lb

## 2011-12-21 DIAGNOSIS — M7989 Other specified soft tissue disorders: Secondary | ICD-10-CM

## 2011-12-21 DIAGNOSIS — W208XXA Other cause of strike by thrown, projected or falling object, initial encounter: Secondary | ICD-10-CM

## 2011-12-21 DIAGNOSIS — M25569 Pain in unspecified knee: Secondary | ICD-10-CM

## 2011-12-21 DIAGNOSIS — M25562 Pain in left knee: Secondary | ICD-10-CM

## 2011-12-21 DIAGNOSIS — M170 Bilateral primary osteoarthritis of knee: Secondary | ICD-10-CM | POA: Insufficient documentation

## 2011-12-21 NOTE — Assessment & Plan Note (Signed)
Ashley Gomez will do very well. I suspect that she has only bruised her knee. I wrapped her leg with ACE. She will ice the injury for 20 minutes 4 times a day. She does get a refill on her narcotics tomorrow, and can use this for any breakthrough pain. I've also written her a work note for seated duty only until I see her again in one week. I anticipate at that point I will release her to more of a full duty position.

## 2011-12-21 NOTE — Progress Notes (Signed)
Patient ID: Ashley Gomez, female   DOB: 20-Oct-1970, 41 y.o.   MRN: [id number] Subjective:   CC: Left knee pain.  HPI: Ashley Gomez is a pleasant 41 year old female who unfortunately dropped a large bucket on her knee today. She developed subsequent bruising localized over the fibular head, and significant pain. The pain is localized, but does not radiate. She is able to work although antalgic. The pain is fairly stable.  Past medical history, Surgical history, Family history, Social history, Allergies, and medications have been entered into the medical record, reviewed, and no changes needed.  Review of Systems: No headache, visual changes, nausea, vomiting, diarrhea, constipation, dizziness, abdominal pain, skin rash, fevers, chills, night sweats, weight loss, chest pain, or shortness of breath.    Objective:  General:  Well Developed, well nourished, and in no acute distress. Neuro:  Alert and oriented x3, extra-ocular muscles intact. HEENT: Normocephalic, atraumatic, pupils equal round reactive to light, neck supple, no masses, no lymphadenopathy, thyroid nonpalpable. Skin: Warm and dry, no rashes noted. Cardiac: Regular rate and rhythm, no murmurs rubs or gallops. Respiratory:  Clear to auscultation bilaterally. Not using accessory muscles, speaking in full sentences. Abdominal: Soft, nontender, nondistended, positive bowel sounds, no masses, no organomegaly. Left Knee: Normal to inspection with no erythema or effusion or obvious bony abnormalities. There is an ecchymosis over the fibular head, she also significant tenderness to palpation here. ROM full in flexion and extension and lower leg rotation. Ligaments with solid consistent endpoints including ACL, PCL, LCL, MCL. Negative Mcmurray's, Apley's, and Thessalonian tests. Non painful patellar compression. Patellar glide without crepitus. Patellar and quadriceps tendons unremarkable. Hamstring and quadriceps strength is normal.   X-rays  of her left knee were ordered, and interpreted by me. They showed no sign of fracture, or dislocation. Assessment & Plan:

## 2011-12-22 ENCOUNTER — Other Ambulatory Visit: Payer: Self-pay | Admitting: *Deleted

## 2011-12-22 MED ORDER — DIAZEPAM 10 MG PO TABS: 10.0000 mg | ORAL_TABLET | Freq: Every evening | ORAL | Status: DC | PRN

## 2011-12-22 MED ORDER — HYDROCODONE-ACETAMINOPHEN 5-325 MG PO TABS: 1.0000 | ORAL_TABLET | Freq: Four times a day (QID) | ORAL | Status: DC | PRN

## 2011-12-22 MED ORDER — ALPRAZOLAM 1 MG PO TABS: 1.0000 mg | ORAL_TABLET | ORAL | Status: DC

## 2011-12-29 ENCOUNTER — Ambulatory Visit: Payer: Self-pay | Admitting: Sports Medicine

## 2012-01-03 ENCOUNTER — Encounter: Payer: Self-pay | Admitting: Physician Assistant

## 2012-01-03 ENCOUNTER — Ambulatory Visit (INDEPENDENT_AMBULATORY_CARE_PROVIDER_SITE_OTHER): Payer: Self-pay | Admitting: Physician Assistant

## 2012-01-03 ENCOUNTER — Ambulatory Visit: Payer: PRIVATE HEALTH INSURANCE | Admitting: Physician Assistant

## 2012-01-03 VITALS — BP 135/68 | HR 81 | Ht 67.0 in | Wt 248.0 lb

## 2012-01-03 DIAGNOSIS — G8929 Other chronic pain: Secondary | ICD-10-CM

## 2012-01-03 DIAGNOSIS — F329 Major depressive disorder, single episode, unspecified: Secondary | ICD-10-CM

## 2012-01-03 DIAGNOSIS — F411 Generalized anxiety disorder: Secondary | ICD-10-CM

## 2012-01-03 DIAGNOSIS — E559 Vitamin D deficiency, unspecified: Secondary | ICD-10-CM

## 2012-01-03 DIAGNOSIS — I10 Essential (primary) hypertension: Secondary | ICD-10-CM

## 2012-01-03 DIAGNOSIS — E538 Deficiency of other specified B group vitamins: Secondary | ICD-10-CM

## 2012-01-03 NOTE — Progress Notes (Signed)
  Subjective:    Patient ID: Ashley Gomez, female    DOB: [birth year], 41 y.o.   MRN: [id number]  HPI Patient presents to the clinic to follow up on HTN and anxiety and depression.  HTN- Denies any CP, palpitations, SOB, Headaches or Dizziness. Taking medications with no side effects. Admits to high salt diet and no exercise. She has gained back the weight she has lost at the last visit.   Anxiety/Depression- She is taking prozac regularly and doing very well. She feels much more controlled. She never could afford Buspar and never took any. Her daughter moved out of the house with her 2 kids and that has relieved a lot of stress. She is in the middle of a workmans comp suit and not working. She is financially strained but feels much more controlled. She has been sleeping much better.   She does mention ongoing pain for years and still taking hydrocodone daily. She has been diagnosed with MS. She has not been able to afford to go to the neurologist. She describes the pain as everywhere and some burning down legs. Hx of scatica.    Review of Systems  All other systems reviewed and are negative.       Objective:   Physical Exam  Constitutional: She is oriented to person, place, and time. She appears well-developed and well-nourished.       Overweight.  HENT:  Head: Normocephalic and atraumatic.  Cardiovascular: Normal rate, regular rhythm, normal heart sounds and intact distal pulses.   Pulmonary/Chest: Effort normal and breath sounds normal. She has no wheezes.  Neurological: She is alert and oriented to person, place, and time.  Skin: Skin is warm and dry.  Psychiatric: She has a normal mood and affect. Her behavior is normal.          Assessment & Plan:    Anxiety/Depression- PHQ-9 was 7 and GAD-7 was 10. Scores have significantly reduced. Pt is to continue the prozac and follow up in 6 months.   HTN- Continue on current BP regimen. Encouraged to start exercising regularly. I  know she has a current injury but even walking could help her lose weight. Also discussed low salt diet. We want to get BP down to 120/80.   Chronic pain/Vit D defiency- Suspect she may have fibromyalgia. Since dx of fibromyalgia is one of exclusion and she does not have a lot of money for test to rule out I would like to start treating her Gabapentin since cheap and may help with pain. Seems there is potential interaction I would like to consult neurology to see if Gabapentin with Topamax is ok. Will call pt back. Used to see Dr. Rosalio Macadamia (Neurologist) not seen due to cost. Encouraged patient to start Vitamin D 2000 units daily along with iron. Since labs were decreased at last visit. Increasing levels may help pain and well as energy. Recheck in 8 weeks.

## 2012-01-03 NOTE — Patient Instructions (Addendum)
2000 units daily of D3. Recheck with lab in 8 weeks.

## 2012-01-22 ENCOUNTER — Other Ambulatory Visit: Payer: Self-pay | Admitting: *Deleted

## 2012-01-22 MED ORDER — HYDROCODONE-ACETAMINOPHEN 5-325 MG PO TABS: 1.0000 | ORAL_TABLET | Freq: Four times a day (QID) | ORAL | Status: DC | PRN

## 2012-01-22 MED ORDER — DIAZEPAM 10 MG PO TABS: 10.0000 mg | ORAL_TABLET | Freq: Every evening | ORAL | Status: DC | PRN

## 2012-01-22 MED ORDER — ALPRAZOLAM 1 MG PO TABS: 1.0000 mg | ORAL_TABLET | ORAL | Status: DC

## 2012-01-26 ENCOUNTER — Ambulatory Visit (HOSPITAL_COMMUNITY)
Admission: RE | Admit: 2012-01-26 | Discharge: 2012-01-26 | Disposition: A | Discharge status: Home / Self Care | Payer: Worker's Compensation | Source: Ambulatory Visit | Attending: Orthopedic Surgery | Admitting: Orthopedic Surgery

## 2012-01-26 DIAGNOSIS — M7989 Other specified soft tissue disorders: Secondary | ICD-10-CM | POA: Insufficient documentation

## 2012-01-26 DIAGNOSIS — M25562 Pain in left knee: Secondary | ICD-10-CM

## 2012-01-26 DIAGNOSIS — R609 Edema, unspecified: Secondary | ICD-10-CM

## 2012-01-26 DIAGNOSIS — R52 Pain, unspecified: Secondary | ICD-10-CM

## 2012-01-26 DIAGNOSIS — M25569 Pain in unspecified knee: Secondary | ICD-10-CM | POA: Insufficient documentation

## 2012-01-26 DIAGNOSIS — E669 Obesity, unspecified: Secondary | ICD-10-CM

## 2012-01-26 NOTE — Progress Notes (Signed)
Left: Possible chronic DVT noted distal femoral vein/proximal popliteal vein.  No evidence of superficial thrombosis.  No Baker's cyst.

## 2012-02-22 ENCOUNTER — Other Ambulatory Visit: Payer: Self-pay | Admitting: *Deleted

## 2012-02-22 MED ORDER — HYDROCODONE-ACETAMINOPHEN 5-325 MG PO TABS: 1.0000 | ORAL_TABLET | Freq: Four times a day (QID) | ORAL | Status: DC | PRN

## 2012-02-22 MED ORDER — ALPRAZOLAM 1 MG PO TABS: 1.0000 mg | ORAL_TABLET | ORAL | Status: DC

## 2012-02-22 MED ORDER — DIAZEPAM 10 MG PO TABS: 10.0000 mg | ORAL_TABLET | Freq: Every evening | ORAL | Status: DC | PRN

## 2012-02-22 MED ORDER — ATENOLOL 50 MG PO TABS: 50.0000 mg | ORAL_TABLET | Freq: Every day | ORAL | Status: DC

## 2012-03-19 ENCOUNTER — Ambulatory Visit (INDEPENDENT_AMBULATORY_CARE_PROVIDER_SITE_OTHER): Payer: Worker's Compensation | Admitting: Family Medicine

## 2012-03-19 ENCOUNTER — Encounter: Payer: Self-pay | Admitting: Family Medicine

## 2012-03-19 ENCOUNTER — Other Ambulatory Visit (HOSPITAL_COMMUNITY)
Admission: RE | Admit: 2012-03-19 | Discharge: 2012-03-19 | Disposition: A | Discharge status: Home / Self Care | Payer: Self-pay | Source: Ambulatory Visit | Attending: Family Medicine | Admitting: Family Medicine

## 2012-03-19 VITALS — BP 146/94 | HR 82 | Ht 66.0 in | Wt 258.0 lb

## 2012-03-19 DIAGNOSIS — N760 Acute vaginitis: Secondary | ICD-10-CM | POA: Insufficient documentation

## 2012-03-19 DIAGNOSIS — Z0000 Encounter for general adult medical examination without abnormal findings: Secondary | ICD-10-CM

## 2012-03-19 DIAGNOSIS — Z113 Encounter for screening for infections with a predominantly sexual mode of transmission: Secondary | ICD-10-CM

## 2012-03-19 DIAGNOSIS — D649 Anemia, unspecified: Secondary | ICD-10-CM

## 2012-03-19 DIAGNOSIS — Z01419 Encounter for gynecological examination (general) (routine) without abnormal findings: Secondary | ICD-10-CM

## 2012-03-19 DIAGNOSIS — E538 Deficiency of other specified B group vitamins: Secondary | ICD-10-CM

## 2012-03-19 DIAGNOSIS — I10 Essential (primary) hypertension: Secondary | ICD-10-CM

## 2012-03-19 LAB — CBC WITH DIFFERENTIAL/PLATELET
Basophils Absolute: 0 10*3/uL (ref 0.0–0.1)
Basophils Relative: 0 % (ref 0–1)
Eosinophils Absolute: 0.2 10*3/uL (ref 0.0–0.7)
Eosinophils Relative: 3 % (ref 0–5)
HCT: 42.2 % (ref 36.0–46.0)
Hemoglobin: 14 g/dL (ref 12.0–15.0)
Lymphocytes Relative: 28 % (ref 12–46)
Lymphs Abs: 2 10*3/uL (ref 0.7–4.0)
MCH: 29.1 pg (ref 26.0–34.0)
MCHC: 33.2 g/dL (ref 30.0–36.0)
MCV: 87.7 fL (ref 78.0–100.0)
Monocytes Absolute: 0.6 10*3/uL (ref 0.1–1.0)
Monocytes Relative: 8 % (ref 3–12)
Neutro Abs: 4.3 10*3/uL (ref 1.7–7.7)
Neutrophils Relative %: 61 % (ref 43–77)
Platelets: 200 10*3/uL (ref 150–400)
RBC: 4.81 MIL/uL (ref 3.87–5.11)
RDW: 15.5 % (ref 11.5–15.5)
WBC: 7.1 10*3/uL (ref 4.0–10.5)

## 2012-03-19 MED ORDER — CYANOCOBALAMIN 1000 MCG/ML IJ SOLN
1000.0000 ug | Freq: Once | INTRAMUSCULAR | Status: AC
  Administered 2012-03-19: 1000 ug via INTRAMUSCULAR

## 2012-03-19 NOTE — Patient Instructions (Addendum)
Start a regular exercise program and make sure you are eating a healthy diet Try to eat 4 servings of dairy a day or take a calcium supplement (500mg twice a day). Your vaccines are up to date.   

## 2012-03-19 NOTE — Progress Notes (Signed)
  Subjective:     Ashley Gomez is a 41 y.o. female and is here for a comprehensive physical exam. The patient reports problems - has noticed towards the end fo her period (which are irregular0 she will get some irritation vaginally. She feels it is from wearing pads. Says not rash right now. .  History   Social History  . Marital Status: Single    Spouse Name: N/A    Number of Children: N/A  . Years of Education: N/A   Occupational History  . Not on file.   Social History Main Topics  . Smoking status: Current Every Day Smoker -- 1.0 packs/day for 25 years    Types: Cigarettes  . Smokeless tobacco: Not on file  . Alcohol Use: Yes     Comment: occasional  . Drug Use: No  . Sexually Active:    Other Topics Concern  . Not on file   Social History Narrative  . No narrative on file   Health Maintenance  Topic Date Due  . Influenza Vaccine  01/14/2012  . Pap Smear  03/20/2015  . Tetanus/tdap  10/17/2020    The following portions of the patient's history were reviewed and updated as appropriate: allergies, current medications, past family history, past medical history, past social history, past surgical history and problem list.  Review of Systems A comprehensive review of systems was negative.   Objective:    BP 146/94  Pulse 82  Ht 5\' 6"  (1.676 m)  Wt 258 lb (117.028 kg)  BMI 41.64 kg/m2 General appearance: alert, cooperative and appears stated age Head: Normocephalic, without obvious abnormality, atraumatic Eyes: conj clera, EOMi, PEERLA Ears: normal TM's and external ear canals both ears Nose: Nares normal. Septum midline. Mucosa normal. No drainage or sinus tenderness. Throat: lips, mucosa, and tongue normal; teeth and gums normal Neck: no adenopathy, no carotid bruit, no JVD, supple, symmetrical, trachea midline and thyroid not enlarged, symmetric, no tenderness/mass/nodules Back: symmetric, no curvature. ROM normal. No CVA tenderness. Lungs: clear to auscultation  bilaterally Breasts: normal appearance, no masses or tenderness Heart: regular rate and rhythm, S1, S2 normal, no murmur, click, rub or gallop Abdomen: soft, non-tender; bowel sounds normal; no masses,  no organomegaly Pelvic: cervix normal in appearance, external genitalia normal, no adnexal masses or tenderness, no cervical motion tenderness, rectovaginal septum normal, uterus normal size, shape, and consistency and vagina normal without discharge Extremities: extremities normal, atraumatic, no cyanosis or edema Pulses: 2+ and symmetric Skin: Skin color, texture, turgor normal. No rashes or lesions Lymph nodes: Cervical, supraclavicular, and axillary nodes normal. Neurologic: Grossly normal    Assessment:    Healthy female exam.      Plan:     See After Visit Summary for Counseling Recommendations  Start a regular exercise program and make sure you are eating a healthy diet Try to eat 4 servings of dairy a day or take a calcium supplement (500mg  twice a day). Your vaccines are up to date.  Due for CMP and lipids Will call with Pap results in about a week. Also she would like to have gonorrhea Chlamydia test as well. She and her husband separated for about a year but then got back together and he was with someone else.  B12 deficiency-we will recheck her level today she'll come back for an injection.

## 2012-03-19 NOTE — Addendum Note (Signed)
Addended by: Judie Petit A on: 03/19/2012 04:07 PM   Modules accepted: Orders

## 2012-03-19 NOTE — Addendum Note (Signed)
Addended by: Judie Petit A on: 03/19/2012 11:22 AM   Modules accepted: Orders

## 2012-03-20 ENCOUNTER — Telehealth: Payer: Self-pay | Admitting: Family Medicine

## 2012-03-20 LAB — LIPID PANEL
Cholesterol: 208 mg/dL — ABNORMAL HIGH (ref 0–200)
HDL: 40 mg/dL (ref >39)
LDL Cholesterol: 127 mg/dL — ABNORMAL HIGH (ref 0–99)
Total CHOL/HDL Ratio: 5.2 Ratio
Triglycerides: 203 mg/dL — ABNORMAL HIGH (ref <150)
VLDL: 41 mg/dL — ABNORMAL HIGH (ref 0–40)

## 2012-03-20 LAB — COMPLETE METABOLIC PANEL WITH GFR
ALT: 8 U/L (ref 0–35)
AST: 17 U/L (ref 0–37)
Albumin: 3.7 g/dL (ref 3.5–5.2)
Alkaline Phosphatase: 64 U/L (ref 39–117)
BUN: 12 mg/dL (ref 6–23)
CO2: 25 mEq/L (ref 19–32)
Calcium: 8.9 mg/dL (ref 8.4–10.5)
Chloride: 105 mEq/L (ref 96–112)
Creat: 0.71 mg/dL (ref 0.50–1.10)
GFR, Est African American: >89 mL/min
GFR, Est Non African American: >89 mL/min
Glucose, Bld: 78 mg/dL (ref 70–99)
Potassium: 4.1 mEq/L (ref 3.5–5.3)
Sodium: 137 mEq/L (ref 135–145)
Total Bilirubin: 0.5 mg/dL (ref 0.3–1.2)
Total Protein: 6.3 g/dL (ref 6.0–8.3)

## 2012-03-20 LAB — VITAMIN B12: Vitamin B-12: 564 pg/mL (ref 211–911)

## 2012-03-20 LAB — RPR

## 2012-03-20 LAB — HIV ANTIBODY (ROUTINE TESTING W REFLEX): HIV: NONREACTIVE

## 2012-03-20 LAB — FERRITIN: Ferritin: 29 ng/mL (ref 10–291)

## 2012-03-20 MED ORDER — GABAPENTIN 100 MG PO CAPS: 100.0000 mg | ORAL_CAPSULE | Freq: Every day | ORAL | Status: DC

## 2012-03-20 NOTE — Telephone Encounter (Addendum)
Please call patient and let her know that I did check into the Neurontin. I think we could start a low dose with her Topamax as she takes her migraines. Sometimes on can even be helpful for migraines if we end up increasing her dose on Neurontin we might be able to decrease her Topamax and still keep her well controlled. I will start with just a low dose of Neurontin at bedtime. And she can see how she does with this.F/U in 6 weeks.

## 2012-03-20 NOTE — Telephone Encounter (Signed)
Left message on vm

## 2012-03-22 ENCOUNTER — Telehealth: Payer: Self-pay | Admitting: Family Medicine

## 2012-03-22 ENCOUNTER — Other Ambulatory Visit: Payer: Self-pay | Admitting: Family Medicine

## 2012-03-22 MED ORDER — HYDROCODONE-ACETAMINOPHEN 5-325 MG PO TABS: 1.0000 | ORAL_TABLET | Freq: Four times a day (QID) | ORAL | Status: DC | PRN

## 2012-03-22 MED ORDER — DIAZEPAM 10 MG PO TABS: 10.0000 mg | ORAL_TABLET | Freq: Every evening | ORAL | Status: DC | PRN

## 2012-03-22 MED ORDER — ALPRAZOLAM 1 MG PO TABS: 1.0000 mg | ORAL_TABLET | ORAL | Status: DC

## 2012-03-22 NOTE — Telephone Encounter (Signed)
No answer and unable to LM

## 2012-03-22 NOTE — Telephone Encounter (Signed)
Patient called and left message on machine requesting that Marcelino Duster call her back

## 2012-03-22 NOTE — Telephone Encounter (Signed)
Pt calling for refills

## 2012-03-25 ENCOUNTER — Other Ambulatory Visit: Payer: Self-pay | Admitting: *Deleted

## 2012-04-19 ENCOUNTER — Other Ambulatory Visit: Payer: Self-pay | Admitting: *Deleted

## 2012-04-19 MED ORDER — HYDROCODONE-ACETAMINOPHEN 5-325 MG PO TABS: 1.0000 | ORAL_TABLET | Freq: Four times a day (QID) | ORAL | Status: DC | PRN

## 2012-04-19 MED ORDER — DIAZEPAM 10 MG PO TABS: 10.0000 mg | ORAL_TABLET | Freq: Every evening | ORAL | Status: DC | PRN

## 2012-04-19 MED ORDER — ALPRAZOLAM 1 MG PO TABS: 1.0000 mg | ORAL_TABLET | ORAL | Status: DC

## 2012-05-06 ENCOUNTER — Ambulatory Visit (INDEPENDENT_AMBULATORY_CARE_PROVIDER_SITE_OTHER): Payer: Worker's Compensation | Admitting: Family Medicine

## 2012-05-06 ENCOUNTER — Encounter: Payer: Self-pay | Admitting: Family Medicine

## 2012-05-06 VITALS — BP 166/93 | HR 68 | Wt 254.0 lb

## 2012-05-06 DIAGNOSIS — G43909 Migraine, unspecified, not intractable, without status migrainosus: Secondary | ICD-10-CM

## 2012-05-06 MED ORDER — KETOROLAC TROMETHAMINE 60 MG/2ML IM SOLN: 60.0000 mg | Freq: Once | INTRAMUSCULAR | Status: DC

## 2012-05-06 MED ORDER — METHYLPREDNISOLONE SODIUM SUCC 40 MG IJ SOLR: 80.0000 mg | Freq: Once | INTRAMUSCULAR | Status: DC

## 2012-05-06 MED ORDER — PROMETHAZINE HCL 50 MG PO TABS: 50.0000 mg | ORAL_TABLET | Freq: Four times a day (QID) | ORAL | Status: DC | PRN

## 2012-05-06 NOTE — Progress Notes (Signed)
CC: Ashley Gomez is a 41 y.o. female is here for Migraine   Subjective: HPI:  Patient reports a headache for the past 17 days. It is identical to her typical migraines. Left-sided unilateral, pounding, mild nausea, photophobia and phonophobia. Denies worst headache of her life. Is not waking her from sleep. Is not influenced by positions and a vertical sense. No worsening with exertion. Moderate in severity. Mild response to Tylenol. No other interventions. Has been present most hours of the day since onset has been no better nor worse since onset. Denies motor or sensory disturbances. Denies fevers, chills, rash, respiratory complaints, shortness of breath, cough, chest pain, abdominal pain, GI disturbance, confusion nor coordination issues    Review Of Systems Outlined In HPI  Past Medical History  Diagnosis Date  . Hypertension   . Depression   . Anxiety   . Seizures   . MS (multiple sclerosis)   . Obesity   . Smoking   . Migraines   . Facet hypertrophy of lumbar region     MRI 2007  . DVT (deep venous thrombosis) 12/13/2009    L basilic vein   . CVA (cerebral infarction)     Old CVA on MRI brain     Family History  Problem Relation Age of Onset  . Cancer Mother 30    melanoma  . Hypertension Sister   . Heart disease Sister     valve disease  . Depression Sister   . Diabetes Sister      History  Substance Use Topics  . Smoking status: Current Every Day Smoker -- 1.0 packs/day for 25 years    Types: Cigarettes  . Smokeless tobacco: Not on file  . Alcohol Use: Yes     Comment: occasional     Objective: Filed Vitals:   05/06/12 1504  BP: 166/93  Pulse: 68    General: Alert and Oriented, No Acute Distress HEENT: Pupils equal, round, reactive to light. Conjunctivae clear.  External ears unremarkable, canals clear with intact TMs with appropriate landmarks.  Middle ear appears open without effusion. Pink inferior turbinates.  Moist mucous membranes, pharynx  without inflammation nor lesions.  Neck supple without palpable lymphadenopathy nor abnormal masses. Neuro: CN II-XII grossly intact, full strength/rom of all four extremities, C5/L4/S1 DTRs 2/4 bilaterally, gait normal, rapid alternating movements normal, heel-shin test normal, Rhomberg normal. Lungs: Clear to auscultation bilaterally, no wheezing/ronchi/rales.  Comfortable work of breathing. Good air movement. Cardiac: Regular rate and rhythm. Normal S1/S2.  No murmurs, rubs, nor gallops.   Extremities: No peripheral edema.  Strong peripheral pulses.  Mental Status: No depression, anxiety, nor agitation. Skin: Warm and dry.  Assessment & Plan: Neeti was seen today for migraine.  Diagnoses and associated orders for this visit:  Migraine - ketorolac (TORADOL) injection 60 mg; Inject 2 mLs (60 mg total) into the muscle once. - methylPREDNISolone sodium succinate (SOLU-MEDROL) 40 mg/mL injection 80 mg; Inject 2 mLs (80 mg total) into the muscle once. - promethazine (PHENERGAN) 50 MG tablet; Take 1 tablet (50 mg total) by mouth every 6 (six) hours as needed for nausea.    Migraine: Uncontrolled. Provided with shot of Toradol and methylprednisone, take promethazine once at home as soon as possible. May consider going up on gabapentin 300 mg every evening for migraine prophylaxis.Signs and symptoms requring emergent/urgent reevaluation were discussed with the patient.  Return if symptoms worsen or fail to improve.

## 2012-05-14 ENCOUNTER — Other Ambulatory Visit: Payer: Self-pay | Admitting: Family Medicine

## 2012-05-20 ENCOUNTER — Other Ambulatory Visit: Payer: Self-pay

## 2012-05-20 MED ORDER — HYDROCODONE-ACETAMINOPHEN 5-325 MG PO TABS: 1.0000 | ORAL_TABLET | Freq: Four times a day (QID) | ORAL | Status: DC | PRN

## 2012-05-20 MED ORDER — DIAZEPAM 10 MG PO TABS: 10.0000 mg | ORAL_TABLET | Freq: Every evening | ORAL | Status: DC | PRN

## 2012-05-20 MED ORDER — ALPRAZOLAM 1 MG PO TABS: 1.0000 mg | ORAL_TABLET | ORAL | Status: DC

## 2012-05-21 ENCOUNTER — Telehealth: Payer: Self-pay

## 2012-05-21 MED ORDER — GABAPENTIN 300 MG PO CAPS: 300.0000 mg | ORAL_CAPSULE | Freq: Every day | ORAL | Status: DC

## 2012-05-21 NOTE — Telephone Encounter (Signed)
Ashley Gomez called and would like a refill of gabapentin 300 mg sent to [facility] in [place]. She states Dr Ivan Anchors increased her dose to 300 mg. The prescription was not changed at the appointment. Is the increase ok?

## 2012-05-21 NOTE — Telephone Encounter (Signed)
Ok for increase to 300mg . Rx sent.

## 2012-06-04 ENCOUNTER — Telehealth: Payer: Self-pay | Admitting: *Deleted

## 2012-06-04 MED ORDER — CYANOCOBALAMIN 1000 MCG/ML IJ SOLN: 1000.0000 ug | Freq: Once | INTRAMUSCULAR | Status: DC

## 2012-06-04 NOTE — Telephone Encounter (Signed)
We can do this but we usually have her come in and have her demonstrate giving the shot on her own so that we can watch her make sure she's doing a properly. I will call in a prescription she can bring in her supply and we can document that we have verified that she can do it safely on her own.

## 2012-06-04 NOTE — Telephone Encounter (Signed)
Pt calls and has a nurse visit scheduled for 06/17/2012 for B12 injection and wants to know if you would call in the B12 dose and she can give it to herself. Please advise

## 2012-06-04 NOTE — Telephone Encounter (Signed)
Pt.notified

## 2012-06-17 ENCOUNTER — Ambulatory Visit: Payer: Self-pay

## 2012-06-20 ENCOUNTER — Telehealth: Payer: Self-pay | Admitting: *Deleted

## 2012-06-20 ENCOUNTER — Other Ambulatory Visit: Payer: Self-pay | Admitting: *Deleted

## 2012-06-20 ENCOUNTER — Ambulatory Visit (INDEPENDENT_AMBULATORY_CARE_PROVIDER_SITE_OTHER): Payer: Worker's Compensation | Admitting: Family Medicine

## 2012-06-20 DIAGNOSIS — D649 Anemia, unspecified: Secondary | ICD-10-CM

## 2012-06-20 MED ORDER — ALPRAZOLAM 1 MG PO TABS: 1.0000 mg | ORAL_TABLET | ORAL | Status: DC

## 2012-06-20 MED ORDER — DIAZEPAM 10 MG PO TABS: 10.0000 mg | ORAL_TABLET | Freq: Every evening | ORAL | Status: DC | PRN

## 2012-06-20 MED ORDER — CYANOCOBALAMIN 1000 MCG/ML IJ SOLN
1000.0000 ug | Freq: Once | INTRAMUSCULAR | Status: AC
  Administered 2012-06-20: 1000 ug via INTRAMUSCULAR

## 2012-06-20 MED ORDER — HYDROCODONE-ACETAMINOPHEN 5-325 MG PO TABS: 1.0000 | ORAL_TABLET | Freq: Four times a day (QID) | ORAL | Status: DC | PRN

## 2012-06-20 NOTE — Progress Notes (Signed)
  Subjective:    Patient ID: Ashley Gomez, female    DOB: [birth year], 42 y.o.   MRN: [id number] b12 injection given HPI    Review of Systems     Objective:   Physical Exam        Assessment & Plan:

## 2012-06-20 NOTE — Telephone Encounter (Signed)
OK for both rx.

## 2012-06-20 NOTE — Telephone Encounter (Signed)
Pt calls and has a nurse visit this afternoon to demonstrate giving her self B12 injection and would like to get the printed rx's for her pain med and xanax while here

## 2012-07-17 ENCOUNTER — Other Ambulatory Visit: Payer: Self-pay | Admitting: *Deleted

## 2012-07-19 ENCOUNTER — Ambulatory Visit: Payer: Self-pay | Admitting: Family Medicine

## 2012-07-19 ENCOUNTER — Other Ambulatory Visit: Payer: Self-pay | Admitting: Family Medicine

## 2012-08-06 ENCOUNTER — Ambulatory Visit (INDEPENDENT_AMBULATORY_CARE_PROVIDER_SITE_OTHER): Payer: Self-pay | Admitting: Family Medicine

## 2012-08-06 ENCOUNTER — Telehealth: Payer: Self-pay | Admitting: *Deleted

## 2012-08-06 ENCOUNTER — Encounter: Payer: Self-pay | Admitting: Family Medicine

## 2012-08-06 VITALS — BP 153/103 | HR 89 | Temp 97.9°F | Wt 248.0 lb

## 2012-08-06 DIAGNOSIS — B9689 Other specified bacterial agents as the cause of diseases classified elsewhere: Secondary | ICD-10-CM

## 2012-08-06 DIAGNOSIS — R05 Cough: Secondary | ICD-10-CM

## 2012-08-06 DIAGNOSIS — J329 Chronic sinusitis, unspecified: Secondary | ICD-10-CM

## 2012-08-06 DIAGNOSIS — A499 Bacterial infection, unspecified: Secondary | ICD-10-CM

## 2012-08-06 DIAGNOSIS — R059 Cough, unspecified: Secondary | ICD-10-CM

## 2012-08-06 MED ORDER — HYDROCODONE-HOMATROPINE 5-1.5 MG/5ML PO SYRP: 5.0000 mL | ORAL_SOLUTION | Freq: Four times a day (QID) | ORAL | Status: DC | PRN

## 2012-08-06 MED ORDER — SULFAMETHOXAZOLE-TRIMETHOPRIM 800-160 MG PO TABS: ORAL_TABLET | ORAL | Status: DC

## 2012-08-06 MED ORDER — AMOXICILLIN-POT CLAVULANATE 500-125 MG PO TABS: ORAL_TABLET | ORAL | Status: DC

## 2012-08-06 NOTE — Progress Notes (Signed)
CC: Ashley Gomez is a 42 y.o. female is here for cough and congestion   Subjective: HPI:  Patient complains of facial pressure localize below both eyes, moderate in severity, has been present for over a week, worsening daily basis. Worse first thing in the morning or late in the evening. Slightly improved with NyQuil, nothing makes better or worse otherwise. Symptoms are interfering with her sleep. Associated with a cough that is nonproductive and only present when lying down flat at night, cough seems to be the biggest problem interfering with her sleep. She's had subjective fevers and chills. She denies nausea, vomiting, motor or sensory disturbances, ocular complaints, eye pain, vision loss    Review Of Systems Outlined In HPI  Past Medical History  Diagnosis Date  . Hypertension   . Depression   . Anxiety   . Seizures   . MS (multiple sclerosis)   . Obesity   . Smoking   . Migraines   . Facet hypertrophy of lumbar region     MRI 2007  . DVT (deep venous thrombosis) 12/13/2009    L basilic vein   . CVA (cerebral infarction)     Old CVA on MRI brain     Family History  Problem Relation Age of Onset  . Cancer Mother 30    melanoma  . Hypertension Sister   . Heart disease Sister     valve disease  . Depression Sister   . Diabetes Sister      History  Substance Use Topics  . Smoking status: Current Every Day Smoker -- 1.00 packs/day for 25 years    Types: Cigarettes  . Smokeless tobacco: Not on file  . Alcohol Use: Yes     Comment: occasional     Objective: Filed Vitals:   08/06/12 1201  BP: 153/103  Pulse: 89  Temp: 97.9 F (36.6 C)    General: Alert and Oriented, No Acute Distress HEENT: Pupils equal, round, reactive to light. Conjunctivae clear.  External ears unremarkable, canals clear with intact TMs with appropriate landmarks.  Middle ear appears open without effusion. Pink inferior turbinates.  Moist mucous membranes, pharynx without inflammation nor  lesions.  Neck supple without palpable lymphadenopathy nor abnormal masses. Maxillary sinus tenderness to percussion Lungs: Clear to auscultation bilaterally, no wheezing/ronchi/rales.  Comfortable work of breathing. Good air movement. Cardiac: Regular rate and rhythm. Extremities: No peripheral edema.  Strong peripheral pulses.  Mental Status: No depression, anxiety, nor agitation. Skin: Warm and dry.  Assessment & Plan: Ashley Gomez was seen today for cough and congestion.  Diagnoses and associated orders for this visit:  Bacterial sinusitis - amoxicillin-clavulanate (AUGMENTIN) 500-125 MG per tablet; Take one by mouth every 8 hours for ten total days.  Cough - HYDROcodone-homatropine (HYCODAN) 5-1.5 MG/5ML syrup; Take 5 mLs by mouth every 6 (six) hours as needed for cough.    Bacterial sinusitis: Encouraged patient to start Augmentin, strongly consider nasal saline washes. May continue DayQuil or NyQuil with needed Cough: Since interfering with sleep we'll start Hycodan but does not take along with NyQuil.  Asked her followup with her PCP at her nearest convenience to discuss hypertension regimen  Return if symptoms worsen or fail to improve.

## 2012-08-06 NOTE — Telephone Encounter (Signed)
Pt.notified

## 2012-08-06 NOTE — Telephone Encounter (Signed)
Generic septra has been sent into her [facility].

## 2012-08-06 NOTE — Telephone Encounter (Signed)
Pt called and states the abx rx'ed was too expensive

## 2012-08-12 ENCOUNTER — Telehealth: Payer: Self-pay | Admitting: *Deleted

## 2012-08-12 NOTE — Telephone Encounter (Signed)
Pt notified and pt has scheduled an office visit on Friday with Dr. Linford Arnold

## 2012-08-12 NOTE — Telephone Encounter (Signed)
Pt called and states she is out of the cough medication that was rx'ed last week. She wants to know if she can get a refill

## 2012-08-12 NOTE — Telephone Encounter (Signed)
Sue Lush, Will you please let Mrs. Clapp know that since the cough syrup contains a controlled substance she would need an office visit for a re-evaluation if requesting a refill.

## 2012-08-16 ENCOUNTER — Ambulatory Visit (INDEPENDENT_AMBULATORY_CARE_PROVIDER_SITE_OTHER): Payer: Worker's Compensation | Admitting: Family Medicine

## 2012-08-16 ENCOUNTER — Encounter: Payer: Self-pay | Admitting: Family Medicine

## 2012-08-16 VITALS — BP 164/98 | HR 94 | Ht 67.0 in | Wt 243.0 lb

## 2012-08-16 DIAGNOSIS — G35 Multiple sclerosis: Secondary | ICD-10-CM

## 2012-08-16 DIAGNOSIS — I10 Essential (primary) hypertension: Secondary | ICD-10-CM

## 2012-08-16 DIAGNOSIS — M5430 Sciatica, unspecified side: Secondary | ICD-10-CM

## 2012-08-16 DIAGNOSIS — E538 Deficiency of other specified B group vitamins: Secondary | ICD-10-CM

## 2012-08-16 DIAGNOSIS — G4700 Insomnia, unspecified: Secondary | ICD-10-CM

## 2012-08-16 DIAGNOSIS — F411 Generalized anxiety disorder: Secondary | ICD-10-CM

## 2012-08-16 DIAGNOSIS — G43009 Migraine without aura, not intractable, without status migrainosus: Secondary | ICD-10-CM

## 2012-08-16 DIAGNOSIS — F329 Major depressive disorder, single episode, unspecified: Secondary | ICD-10-CM

## 2012-08-16 MED ORDER — FLUOXETINE HCL 10 MG PO CAPS: ORAL_CAPSULE | ORAL | Status: DC

## 2012-08-16 MED ORDER — DIAZEPAM 10 MG PO TABS: ORAL_TABLET | ORAL | Status: DC

## 2012-08-16 MED ORDER — HYDROCODONE-ACETAMINOPHEN 7.5-325 MG PO TABS: 1.0000 | ORAL_TABLET | Freq: Four times a day (QID) | ORAL | Status: DC | PRN

## 2012-08-16 NOTE — Progress Notes (Signed)
Subjective:    Patient ID: Ashley Gomez, female    DOB: December 24, 1970, 42 y.o.   MRN: [id number]  HPI Generalized anxiety disorder-she's been undergoing a lot of stress recently. She's also without insurance and that adds to her stress. Has been really nervous and anxious.  Has had diarrhea.  Says the neurontin hasn't been helpful for hear headaches. Has had a cough that has been keeping her awake as well.  She mostly takes her Valium at bedtime to help her sleep but that only works for about 2-3 hours and then she wakes up feeling anxious again. She does take Xanax in the morning but feels it makes her worse. She feels more cranky and irritable on it. She continues to take it. She has not been taking the fluoxetine regularly. She says she only uses it when necessary. She has tried sertraline in the past and didn't do well with that.  Has had a cough for 2.5 weeks.  No fever.  Cough is worse at bedtime.  Has been using nyquail.  She has one more day on her Augmentin. She says overall she does feel better but the cough is still lingering.   Chronic back pain and sciatica with MS.  She feels like her pain regimen is not currently enough. She says she used to take 10 mg hydrocodone up to 4 times a day with her previous provider. Then she saw Dr. Seymour Bars who had decreased her down to 5 mg. She typically gets 100 tabs every 30 days. She feels like this is not adequate. She says she is Re: had a medication. She says this month has been particularly bad for her pain.  Migraine-she feels it is are not well controlled at all. She's currently taking Topamax as well as Neurontin that was recently increased. She does feel the Neurontin helps her sleep at night.   Review of Systems     Objective:   Physical Exam  Constitutional: She is oriented to person, place, and time. She appears well-developed and well-nourished.  HENT:  Head: Normocephalic and atraumatic.  Right Ear: External ear normal.  Left Ear:  External ear normal.  Nose: Nose normal.  Mouth/Throat: Oropharynx is clear and moist.  Eyes: Conjunctivae and EOM are normal. Pupils are equal, round, and reactive to light.  Neck: Neck supple. No thyromegaly present.  Cardiovascular: Normal rate, regular rhythm and normal heart sounds.   Pulmonary/Chest: Effort normal and breath sounds normal. She has no wheezes.  Lymphadenopathy:    She has no cervical adenopathy.  Neurological: She is alert and oriented to person, place, and time.  Skin: Skin is warm and dry.  Psychiatric: She has a normal mood and affect.          Assessment & Plan:  Anxiety - uncontrolled. Her gad 7 score is 19 today. We discussed that she really needs to be on a controller medication. We'll start fluoxetine back in August. It turns out that she was only taking it when necessary and not on a daily basis. If she actually been taking it daily she would have run out in September. I discussed the importance of taking it daily for this medication to be effective and actually work. Ultimately the goal is that she does not have to use her rescue benzodiazepine medications to control her symptoms. We will restart fluoxetine. I would like to see her back in one month so that we can continue to taper her up to 40 mg to get  better control. She's also had some increased external stressors in her life for the last month which certainly is exacerbating her symptoms.  Migraine headaches-not well controlled right now. Most likely secondary to recent stressors. She's also not sleeping well and porcine quality definitely contributes. I want to really work on this 2 issues first before we start adjusting her medication again. Hopefully get her back on fluoxetine will help control her anxiety which in turn will also help her sleep. We will redress in one month. She is also a smoker which certainly contributes as well.  Cough-she feels much better overall. Complete antibiotic and is still  having a persistent cough into next week he'll be happy to get chest x-ray for further evaluation since she is a smoker. I did not refill her cough medication today as she is Re: on chronic hydrocodone.  B12 deficiency-given injection today.  Chronic back pain with sciatica-we discussed different options. I really do not feel comfortable going up much further on her chronic pain medications. I explained her that at that point she will need to see pain management. I did increase her dose to 7.5 mg but reduced in quantity from 100 down to 90. That way she can take 3 a day at the most. I explained her that we'll have to last for 30 days. She will also need to sign a pain contract. I do not think she has signed wants and she was seeing my partner Dr. Seymour Bars.  Insomnia-can continue Valium at bedtime for now. Use only when needed and try to use half a tablet able to.  Time spent 40 minutes, greater than 50% counseling about her chronic pain medications and anxiety.

## 2012-08-27 ENCOUNTER — Ambulatory Visit (INDEPENDENT_AMBULATORY_CARE_PROVIDER_SITE_OTHER): Payer: Self-pay | Admitting: Family Medicine

## 2012-08-27 ENCOUNTER — Telehealth: Payer: Self-pay | Admitting: *Deleted

## 2012-08-27 VITALS — BP 169/102 | HR 89

## 2012-08-27 DIAGNOSIS — G43009 Migraine without aura, not intractable, without status migrainosus: Secondary | ICD-10-CM

## 2012-08-27 MED ORDER — KETOROLAC TROMETHAMINE 60 MG/2ML IM SOLN
60.0000 mg | Freq: Once | INTRAMUSCULAR | Status: AC
  Administered 2012-08-27: 60 mg via INTRAMUSCULAR

## 2012-08-27 NOTE — Progress Notes (Signed)
Chart was reviewed and per patient request single injection of Toradol was offered. Encouraged patient to return with PCP as soon as possible for followup.Signs and symptoms requring emergent/urgent reevaluation were discussed with the patient.

## 2012-08-27 NOTE — Telephone Encounter (Signed)
Pt called back and stated that she was taken off of the xanax. And wasn't sure if this is the cause of the headache she is not sleeping well at night she only sleeps for 90 mins at a time the gabapentin that is supposed to help with her to sleep is not helping. She would like to come in to get an injection to help ease the pain of this. Put her on nurse schedule for 330 today. Will discuss with either Dr. Ivan Anchors or Dr. Benjamin Stain .Laureen Ochs, Viann Shove

## 2012-08-27 NOTE — Telephone Encounter (Signed)
Pt called and stated that she is having migraines and unable to sleep she isn't sure if the headaches are being caused by the xanax or what. She would like to know what she should stop taking. Tried to call her back and got her vm left message for her to return call.Marland Kitchen Marland KitchenLoralee Pacas [place]

## 2012-08-27 NOTE — Patient Instructions (Signed)
Pt advised to wait 10 mins prior to leaving and to follow up with Dr. Linford Arnold.Laureen Ochs, Viann Shove

## 2012-08-28 ENCOUNTER — Telehealth: Payer: Self-pay | Admitting: *Deleted

## 2012-08-28 DIAGNOSIS — G43909 Migraine, unspecified, not intractable, without status migrainosus: Secondary | ICD-10-CM

## 2012-08-28 NOTE — Telephone Encounter (Signed)
Had shot of toradol yesterday for H/A and today its still there just not as intense. States was on Midrin before they took off market was the only thing that helped her headaches and wanted to know if there was anything comparable to the Midrin that is not as expensive that she could try.

## 2012-08-29 ENCOUNTER — Telehealth: Payer: Self-pay | Admitting: *Deleted

## 2012-08-29 MED ORDER — ZOLMITRIPTAN 2.5 MG PO TABS: 2.5000 mg | ORAL_TABLET | ORAL | Status: DC | PRN

## 2012-08-29 NOTE — Telephone Encounter (Signed)
Has she tried Imitrex before? It is the least expensive on the market because it does have a generic available. If she is Re: tried that and we can maybe try Maxalt. It totally depends on what is ranked best on her formulary. But Imitrex is usually the cheapest to start with.

## 2012-08-29 NOTE — Telephone Encounter (Signed)
Mrs. Larios is encouraged to follow up with PCP to discuss abortive migraine medications since the ketorolac visit was only a nurse visit. However, I do know that a medication called zomig is helpful for aborting migraines, temporary Rx sent to [facility] on file.

## 2012-08-29 NOTE — Telephone Encounter (Signed)
Pt called and states the zomig is too expensive and wants to know if there is something cheaper to be called in. She states she will try and get this medication and then schedule a f/u with [facility] as Dr. Ivan Anchors suggested.

## 2012-09-02 ENCOUNTER — Telehealth: Payer: Self-pay | Admitting: *Deleted

## 2012-09-02 NOTE — Telephone Encounter (Signed)
Spoke with pt and she has tried the imitrex and maxalt. Advised pt needs appointment as she was voicing concerns of perimenopause. Barry Dienes, LPN

## 2012-09-05 ENCOUNTER — Encounter: Payer: Self-pay | Admitting: Family Medicine

## 2012-09-05 ENCOUNTER — Ambulatory Visit (INDEPENDENT_AMBULATORY_CARE_PROVIDER_SITE_OTHER): Payer: Self-pay | Admitting: Family Medicine

## 2012-09-05 VITALS — BP 151/95 | HR 92 | Wt 244.0 lb

## 2012-09-05 DIAGNOSIS — R232 Flushing: Secondary | ICD-10-CM

## 2012-09-05 DIAGNOSIS — G43909 Migraine, unspecified, not intractable, without status migrainosus: Secondary | ICD-10-CM

## 2012-09-05 DIAGNOSIS — I10 Essential (primary) hypertension: Secondary | ICD-10-CM

## 2012-09-05 DIAGNOSIS — N951 Menopausal and female climacteric states: Secondary | ICD-10-CM

## 2012-09-05 DIAGNOSIS — R6883 Chills (without fever): Secondary | ICD-10-CM

## 2012-09-05 DIAGNOSIS — N912 Amenorrhea, unspecified: Secondary | ICD-10-CM

## 2012-09-05 MED ORDER — FLUOXETINE HCL 40 MG PO CAPS: 40.0000 mg | ORAL_CAPSULE | Freq: Every day | ORAL | Status: DC

## 2012-09-05 MED ORDER — LOSARTAN POTASSIUM 50 MG PO TABS: 50.0000 mg | ORAL_TABLET | Freq: Every day | ORAL | Status: DC

## 2012-09-05 NOTE — Progress Notes (Signed)
Subjective:    Patient ID: Ashley Gomez, female    DOB: [birth year], 42 y.o.   MRN: [id number]  HPI Missed her period this month.  Around the time she was due for her. She started feeling poorly. She started having hot flashes and then would have cold chills. She says the hot flashes lasted for a missed 4 days. She still has some more mild hot flashes since then but not nearly as severe. Now she feels cold most of the time. No prior history of thyroid problems. Says is going to [place] chiropractor and they did blood pressures and they felt she may have adrenal problems.  She wants and if this could be checked as well. She says a couple years ago she was told by Dr. Cathey Endow that she was probably perimenopausal as she was having some symptoms then. She says that if menopause is like this she says she just can't live through it. We also switched her Xanax to Valium around that time and we also started her back on fluoxetine which she was not taking consistently. Says she also wonders if it could be a medication side effect. She also started having severe headaches and it was triggering her migraines during this period as well. Infection to come in for a shot for acute pain relief. It took several days for the headache to go away.  Review of Systems     Objective:   Physical Exam  Constitutional: She is oriented to person, place, and time. She appears well-developed and well-nourished.  HENT:  Head: Normocephalic and atraumatic.  Eyes: Conjunctivae are normal. Pupils are equal, round, and reactive to light.  Neck: Neck supple. No thyromegaly present.  Cardiovascular: Normal rate, regular rhythm and normal heart sounds.   Pulmonary/Chest: Effort normal and breath sounds normal.  Lymphadenopathy:    She has no cervical adenopathy.  Neurological: She is alert and oriented to person, place, and time.  Skin: Skin is warm and dry.  Psychiatric: She has a normal mood and affect. Her behavior is normal.           Assessment & Plan:  Amenorrhea - we will check her FSH, LH, estradiol.  We will also check her thyroid and an a.m. cortisol level. Certainly some of her symptoms could be perimenopausal but also have been from her recent medication changes. Though I do not think she went through a benzodiazepine withdrawal as we switch from one project to another product. She cannot go without the medication.  Migraines-they've been recently exacerbated-continue Topamax for now. Continue gabapentin as well. I think getting her blood pressure under maximum control medications difference in the control for headaches. We discussed this. We're going to adjust her medication regimen for blood pressure and I'll see her back in 3-4 weeks.  Hypertension - uncontrolled. After much discussion we decided to add losartan to her atenolol. She had a cough with ACE inhibitors in the past we will avoid this. Medications extensive she's to call the office back. Otherwise would like to see her back in 3-4 weeks to make sure that we are getting her blood pressure under maximum control. If she feels lightheaded or dizzy she can cut the pill in half if needed at least until I see her back encouraged her not to skip doses which he frequently does.  Hot flashes-could be hormonal versus secondary to recent medication changes. At this point time it actually like to go up on her fluoxetine to 40 mg. She was previously  on this dose. Also strongly encouraged to be consistent with her medications and not skip doses.  Time spend 25 mim. 50% spent in couseling about amenorrhea, migraines, hypertension, hot flashes.

## 2012-09-06 LAB — PROGESTERONE: Progesterone: <0.2 ng/mL

## 2012-09-06 LAB — TSH: TSH: 0.67 u[IU]/mL (ref 0.350–4.500)

## 2012-09-06 LAB — ESTRADIOL: Estradiol: <11.8 pg/mL

## 2012-09-06 LAB — FOLLICLE STIMULATING HORMONE: FSH: 41 m[IU]/mL

## 2012-09-06 LAB — LUTEINIZING HORMONE: LH: 43.8 m[IU]/mL

## 2012-09-07 LAB — CORTISOL-AM, BLOOD: Cortisol - AM: 18.7 ug/dL (ref 4.3–22.4)

## 2012-09-10 ENCOUNTER — Telehealth: Payer: Self-pay | Admitting: *Deleted

## 2012-09-10 NOTE — Telephone Encounter (Signed)
Called pt lvm to ask her if she has ever tried Imitrex for her migraines. Laureen Ochs, Viann Shove

## 2012-09-11 ENCOUNTER — Other Ambulatory Visit: Payer: Self-pay | Admitting: Family Medicine

## 2012-09-11 MED ORDER — SUMATRIPTAN SUCCINATE 100 MG PO TABS: 100.0000 mg | ORAL_TABLET | ORAL | Status: DC | PRN

## 2012-09-11 NOTE — Progress Notes (Signed)
Sent rx for imitrex

## 2012-09-12 ENCOUNTER — Ambulatory Visit (INDEPENDENT_AMBULATORY_CARE_PROVIDER_SITE_OTHER): Payer: Self-pay | Admitting: Family Medicine

## 2012-09-12 ENCOUNTER — Encounter: Payer: Self-pay | Admitting: Family Medicine

## 2012-09-12 VITALS — BP 121/87 | HR 86 | Temp 86.0°F | Wt 244.0 lb

## 2012-09-12 DIAGNOSIS — T7491XD Unspecified adult maltreatment, confirmed, subsequent encounter: Secondary | ICD-10-CM

## 2012-09-12 DIAGNOSIS — G43009 Migraine without aura, not intractable, without status migrainosus: Secondary | ICD-10-CM

## 2012-09-12 DIAGNOSIS — I10 Essential (primary) hypertension: Secondary | ICD-10-CM

## 2012-09-12 DIAGNOSIS — Z5189 Encounter for other specified aftercare: Secondary | ICD-10-CM

## 2012-09-12 MED ORDER — METHYLPREDNISOLONE SODIUM SUCC 125 MG IJ SOLR
125.0000 mg | Freq: Once | INTRAMUSCULAR | Status: AC
  Administered 2012-09-12: 125 mg via INTRAMUSCULAR

## 2012-09-12 MED ORDER — KETOROLAC TROMETHAMINE 60 MG/2ML IM SOLN
60.0000 mg | Freq: Once | INTRAMUSCULAR | Status: AC
  Administered 2012-09-12: 60 mg via INTRAMUSCULAR

## 2012-09-12 MED ORDER — PREDNISONE 20 MG PO TABS: ORAL_TABLET | ORAL | Status: AC

## 2012-09-12 MED ORDER — PROMETHAZINE HCL 50 MG PO TABS: ORAL_TABLET | ORAL | Status: DC
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
## 2012-09-13 ENCOUNTER — Telehealth: Payer: Self-pay | Admitting: *Deleted

## 2012-09-13 ENCOUNTER — Encounter: Payer: Self-pay | Admitting: *Deleted

## 2012-09-13 ENCOUNTER — Other Ambulatory Visit: Payer: Self-pay | Admitting: Family Medicine

## 2012-09-13 NOTE — Telephone Encounter (Signed)
Pt called and informed that Dr. Linford Arnold has refilled her meds however, she will need to come by our office to sign a pain contract prior to the Rx being released. Pt voiced understanding and agreed.Loralee Pacas [place]

## 2012-09-16 ENCOUNTER — Ambulatory Visit: Payer: Self-pay | Admitting: Family Medicine

## 2012-09-24 ENCOUNTER — Ambulatory Visit: Payer: Self-pay | Admitting: Family Medicine

## 2012-10-04 ENCOUNTER — Ambulatory Visit (INDEPENDENT_AMBULATORY_CARE_PROVIDER_SITE_OTHER): Payer: Self-pay | Admitting: Family Medicine

## 2012-10-04 ENCOUNTER — Encounter: Payer: Self-pay | Admitting: Family Medicine

## 2012-10-04 VITALS — BP 128/81 | HR 78 | Wt 244.0 lb

## 2012-10-04 DIAGNOSIS — M791 Myalgia, unspecified site: Secondary | ICD-10-CM

## 2012-10-04 DIAGNOSIS — IMO0001 Reserved for inherently not codable concepts without codable children: Secondary | ICD-10-CM

## 2012-10-04 DIAGNOSIS — G43009 Migraine without aura, not intractable, without status migrainosus: Secondary | ICD-10-CM

## 2012-10-04 DIAGNOSIS — N959 Unspecified menopausal and perimenopausal disorder: Secondary | ICD-10-CM

## 2012-10-04 LAB — COMPLETE METABOLIC PANEL WITH GFR
ALT: 12 U/L (ref 0–35)
AST: 16 U/L (ref 0–37)
Albumin: 4.1 g/dL (ref 3.5–5.2)
Alkaline Phosphatase: 59 U/L (ref 39–117)
BUN: 11 mg/dL (ref 6–23)
CO2: 28 mEq/L (ref 19–32)
Calcium: 9.3 mg/dL (ref 8.4–10.5)
Chloride: 101 mEq/L (ref 96–112)
Creat: 0.75 mg/dL (ref 0.50–1.10)
GFR, Est African American: >89 mL/min
GFR, Est Non African American: >89 mL/min
Glucose, Bld: 85 mg/dL (ref 70–99)
Potassium: 4.4 mEq/L (ref 3.5–5.3)
Sodium: 137 mEq/L (ref 135–145)
Total Bilirubin: 0.5 mg/dL (ref 0.3–1.2)
Total Protein: 6.7 g/dL (ref 6.0–8.3)

## 2012-10-04 MED ORDER — FLUOXETINE HCL 40 MG PO CAPS: ORAL_CAPSULE | ORAL | Status: DC

## 2012-10-04 MED ORDER — VENLAFAXINE HCL 37.5 MG PO TABS: 37.5000 mg | ORAL_TABLET | Freq: Two times a day (BID) | ORAL | Status: DC

## 2012-10-04 MED ORDER — CYCLOBENZAPRINE HCL 10 MG PO TABS: 10.0000 mg | ORAL_TABLET | Freq: Every evening | ORAL | Status: DC | PRN

## 2012-10-04 NOTE — Patient Instructions (Addendum)
Decrease fluoxetine to 20 mg for one week, then 10mg  for one week. Then stop.   Then start the effexor (venlafaxine) when you get to the every other day dose on the fluoxetine.

## 2012-10-04 NOTE — Progress Notes (Signed)
Quick Note:  All labs are normal. ______ 
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
## 2012-10-14 ENCOUNTER — Other Ambulatory Visit: Payer: Self-pay | Admitting: Family Medicine

## 2012-10-15 ENCOUNTER — Telehealth: Payer: Self-pay | Admitting: Family Medicine

## 2012-10-15 NOTE — Telephone Encounter (Signed)
Call pt: needs to come in to sign a pain contract. I didn't see one in the chart, but I may have overlooked it.

## 2012-10-15 NOTE — Telephone Encounter (Signed)
Pt has signed a pain contract on 09/20/2012 and is under media tab as rx contract. Barry Dienes, LPN

## 2012-11-04 ENCOUNTER — Encounter: Payer: Self-pay | Admitting: Family Medicine

## 2012-11-04 ENCOUNTER — Ambulatory Visit (INDEPENDENT_AMBULATORY_CARE_PROVIDER_SITE_OTHER): Payer: Self-pay | Admitting: Family Medicine

## 2012-11-04 VITALS — BP 128/87 | HR 99 | Wt 254.0 lb

## 2012-11-04 DIAGNOSIS — F411 Generalized anxiety disorder: Secondary | ICD-10-CM

## 2012-11-04 DIAGNOSIS — G43009 Migraine without aura, not intractable, without status migrainosus: Secondary | ICD-10-CM

## 2012-11-04 DIAGNOSIS — F419 Anxiety disorder, unspecified: Secondary | ICD-10-CM

## 2012-11-04 DIAGNOSIS — N644 Mastodynia: Secondary | ICD-10-CM

## 2012-11-04 DIAGNOSIS — M791 Myalgia, unspecified site: Secondary | ICD-10-CM

## 2012-11-04 DIAGNOSIS — IMO0001 Reserved for inherently not codable concepts without codable children: Secondary | ICD-10-CM

## 2012-11-04 MED ORDER — SUMATRIPTAN SUCCINATE 100 MG PO TABS: 100.0000 mg | ORAL_TABLET | ORAL | Status: DC | PRN

## 2012-11-04 MED ORDER — TOPIRAMATE 100 MG PO TABS: 100.0000 mg | ORAL_TABLET | Freq: Two times a day (BID) | ORAL | Status: DC

## 2012-11-04 MED ORDER — ZOLMITRIPTAN 2.5 MG NA SOLN: 2.5000 mg | Freq: Once | NASAL | Status: DC

## 2012-11-04 MED ORDER — CYCLOBENZAPRINE HCL 10 MG PO TABS: 10.0000 mg | ORAL_TABLET | Freq: Every evening | ORAL | Status: DC | PRN

## 2012-11-04 MED ORDER — GABAPENTIN 300 MG PO CAPS: 300.0000 mg | ORAL_CAPSULE | Freq: Every day | ORAL | Status: DC

## 2012-11-04 MED ORDER — ZOLMITRIPTAN 5 MG NA SOLN: 1.0000 | NASAL | Status: DC | PRN

## 2012-11-04 MED ORDER — VENLAFAXINE HCL 75 MG PO TABS: 75.0000 mg | ORAL_TABLET | Freq: Two times a day (BID) | ORAL | Status: DC

## 2012-11-04 NOTE — Progress Notes (Signed)
  Subjective:    Patient ID: Ashley Gomez, female    DOB: Sep 12, 1970, 42 y.o.   MRN: [id number]  HPI Had severe breast tenderness for about a week.  It is better this week. Says her mammo is UTD. She denies any trauma or injury. No lumps or bumps. They just felt sore overall a most like when you're breast-feeding is what she compared it to.  Anxiety State - doing ok on effexor. Has been dreaming on it which is unusual for her.  She is sleeping better. No suicidal thought. No other S.E.  she still feels some quite anxious overall.  Headache - Have been a little better. Still losing her peripheral vision. Did have to an imitrex yesterday. Would like me to take over rx her topamax since can't afford to see Neurology again. Likes Zomig but can't afford it.    Review of Systems     Objective:   Physical Exam  Constitutional: She is oriented to person, place, and time. She appears well-developed and well-nourished.  HENT:  Head: Normocephalic and atraumatic.  Cardiovascular: Normal rate, regular rhythm and normal heart sounds.   Pulmonary/Chest: Effort normal and breath sounds normal.  Neurological: She is alert and oriented to person, place, and time.  Skin: Skin is warm and dry.  Psychiatric: She has a normal mood and affect. Her behavior is normal.          Assessment & Plan:  Breast tenderness  - discussed be secondary to hormones. I do think she is perimenopausal. Also consider could be a side effect of the Effexor but had never heard of this but she did started about a month ago.  Anxiety - overall doing okay on the Effexor except having more vivid dreams. She says they're not scaring her nightmares so we'll stick with this medication a little bit longer. I will increase Effexor to 75 mg twice a day. I was hoping that would help with her mood in addition to her menopausal symptoms. Followup in 2 months. Call if any problems or side effects of medication.  Headache - they are  improving but still not under great control. We'll continue her Topamax for now and hopefully we can get her mood and anxiety under better control as well as her sleep quality that this will help as well. I did refill her Flexeril which he uses occasionally when she gets her migraines. Samples of Zomig given as well. I did refill her Topamax, Imitrex today. Hopefully we can minimize her use of Imitrex since it can increase risk of serotonin syndrome in combination with the Effexor.

## 2012-11-12 ENCOUNTER — Other Ambulatory Visit: Payer: Self-pay | Admitting: Family Medicine

## 2012-11-13 ENCOUNTER — Other Ambulatory Visit: Payer: Self-pay | Admitting: Family Medicine

## 2012-12-12 ENCOUNTER — Other Ambulatory Visit: Payer: Self-pay | Admitting: *Deleted

## 2012-12-12 MED ORDER — DIAZEPAM 10 MG PO TABS: ORAL_TABLET | ORAL | Status: DC

## 2012-12-12 MED ORDER — HYDROCODONE-ACETAMINOPHEN 7.5-325 MG PO TABS: ORAL_TABLET | ORAL | Status: DC

## 2013-01-06 ENCOUNTER — Ambulatory Visit (INDEPENDENT_AMBULATORY_CARE_PROVIDER_SITE_OTHER): Payer: Self-pay | Admitting: Family Medicine

## 2013-01-06 ENCOUNTER — Encounter: Payer: Self-pay | Admitting: Family Medicine

## 2013-01-06 VITALS — BP 141/82 | HR 92 | Ht 66.0 in | Wt 256.0 lb

## 2013-01-06 DIAGNOSIS — R569 Unspecified convulsions: Secondary | ICD-10-CM

## 2013-01-06 DIAGNOSIS — N959 Unspecified menopausal and perimenopausal disorder: Secondary | ICD-10-CM

## 2013-01-06 DIAGNOSIS — G609 Hereditary and idiopathic neuropathy, unspecified: Secondary | ICD-10-CM

## 2013-01-06 DIAGNOSIS — G35 Multiple sclerosis: Secondary | ICD-10-CM

## 2013-01-06 DIAGNOSIS — G43909 Migraine, unspecified, not intractable, without status migrainosus: Secondary | ICD-10-CM

## 2013-01-06 LAB — POCT HEMOGLOBIN: Hemoglobin: 16.1 g/dL (ref 12.2–16.2)

## 2013-01-06 NOTE — Progress Notes (Signed)
  Subjective:    Patient ID: Ashley Gomez, female    DOB: Jul 12, 1970, 42 y.o.   MRN: [id number]  HPI  Still having frequent migraine headaches.  Says her palms and bottom of feet burning. Says has noticed it for at least 3-4 weeks.  Says feels like walking on hot sand.  Gen body aches and pains. Says she has had aching diffusely in her legs and upper back.  Has been getitng more muscle cramps.   Cough x 1 month, dry, keeps cough drops.  No fever, chills, or sweats.  Non productive.    Hotflashes - ran out of effexor 3 days ago. Says can't afford to fill it for a week.    Seizure disorder-she feels that she's been having some mini seizures. She says she will get a sensation in her head that feels like it's tightening. She'll fill a tightness in her jaw and feel mentally a little off. She says that we'll then ease off. She's been getting more of these episodes. She's not sure if it's related to her seizure disorder, MS, or migraines.  Review of Systems     Objective:   Physical Exam  Constitutional: She is oriented to person, place, and time. She appears well-developed and well-nourished.  HENT:  Head: Normocephalic and atraumatic.  Right Ear: External ear normal.  Left Ear: External ear normal.  Nose: Nose normal.  Mouth/Throat: Oropharynx is clear and moist.  TMs and canals are clear.   Eyes: Conjunctivae and EOM are normal. Pupils are equal, round, and reactive to light.  Neck: Neck supple. No thyromegaly present.  Cardiovascular: Normal rate, regular rhythm and normal heart sounds.   Pulmonary/Chest: Effort normal and breath sounds normal. She has no wheezes.  Lymphadenopathy:    She has no cervical adenopathy.  Neurological: She is alert and oriented to person, place, and time.  Skin: Skin is warm and dry.  Psychiatric: She has a normal mood and affect.          Assessment & Plan:  Migraine  HA - she has not been well controlled. She did not tolerate atenolol. Topamax  doesn't seem to be working well for her migraines but she is also on it for seizure prophylaxis. Ultimately, she really needs to get back in with her neurologist discussed probably adjusting her medication.  Neuropathy - Continue neurontin. Needs to quit smoking.  Burning in hands and feet, could be part of the MS.  explained that sometimes Neurontin can cause numbness in hands or feet but usually not the burning sensation that she's describing. I really want her to get back in with her neurologist is at the like a lot of her problems are stemming from her in mass.  Cough  -  Consider CXR if not improving. Could be reflux related. Recommend PPI trial.  She has no insruance right now and can't afford the effexor.   Seizure d/o- really needs to f/u with neuro.

## 2013-01-07 ENCOUNTER — Telehealth: Payer: Self-pay | Admitting: Family Medicine

## 2013-01-07 NOTE — Telephone Encounter (Signed)
Please call patient. For her cough I would like her to try over-the-counter Prilosec or we may even have some samples of dexilant here that she try for couple weeks and see if this helps. I'm thinking that her cough is more likely to be related to her reflux as her lungs, greater on exam yesterday. Actually meant to give her some samples before she left then I forgot to do it.

## 2013-01-08 NOTE — Telephone Encounter (Signed)
lvm for return call.Ashley Gomez Lynetta  

## 2013-01-08 NOTE — Telephone Encounter (Signed)
Pt given recommendations.Ashley Gomez  

## 2013-01-09 ENCOUNTER — Ambulatory Visit (INDEPENDENT_AMBULATORY_CARE_PROVIDER_SITE_OTHER): Payer: Self-pay | Admitting: Obstetrics & Gynecology

## 2013-01-09 ENCOUNTER — Encounter: Payer: Self-pay | Admitting: Obstetrics & Gynecology

## 2013-01-09 VITALS — BP 158/103 | HR 104 | Resp 18 | Ht 66.0 in | Wt 255.0 lb

## 2013-01-09 DIAGNOSIS — N912 Amenorrhea, unspecified: Secondary | ICD-10-CM

## 2013-01-09 NOTE — Progress Notes (Signed)
Pt presents with complaints of post-menopausal bleeding.  Pt had menarche at 56 and had regular monthly menses until February of 2014. She skipped her menses in Feb-May.  She had a very heavy menses in June an July.  She is due to start her menses anyday now. Associated symptoms include severe headache not relieved with Imitrex or Topamax.   Pt has not had any spotting, GI symptoms, urinary symptoms. She is having hot flashes.  Pt had an elevated FSH and low estradiol. In April 2014 c/w/ menopause.  Will draw labs today to investigate other hormonal abnormalities. May need to run case by Dr. Jeannie Fend. Will call pt with results and plan.

## 2013-01-10 ENCOUNTER — Other Ambulatory Visit: Payer: Self-pay | Admitting: *Deleted

## 2013-01-10 ENCOUNTER — Ambulatory Visit (INDEPENDENT_AMBULATORY_CARE_PROVIDER_SITE_OTHER): Payer: Self-pay

## 2013-01-10 DIAGNOSIS — N898 Other specified noninflammatory disorders of vagina: Secondary | ICD-10-CM

## 2013-01-10 DIAGNOSIS — N912 Amenorrhea, unspecified: Secondary | ICD-10-CM

## 2013-01-10 DIAGNOSIS — N926 Irregular menstruation, unspecified: Secondary | ICD-10-CM

## 2013-01-10 DIAGNOSIS — M791 Myalgia, unspecified site: Secondary | ICD-10-CM

## 2013-01-10 DIAGNOSIS — N939 Abnormal uterine and vaginal bleeding, unspecified: Secondary | ICD-10-CM

## 2013-01-10 MED ORDER — DIAZEPAM 10 MG PO TABS: ORAL_TABLET | ORAL | Status: DC

## 2013-01-10 MED ORDER — GABAPENTIN 300 MG PO CAPS: 300.0000 mg | ORAL_CAPSULE | Freq: Every day | ORAL | Status: DC

## 2013-01-10 MED ORDER — CYCLOBENZAPRINE HCL 10 MG PO TABS: 10.0000 mg | ORAL_TABLET | Freq: Every evening | ORAL | Status: DC | PRN

## 2013-01-10 MED ORDER — LOSARTAN POTASSIUM 50 MG PO TABS: ORAL_TABLET | ORAL | Status: DC

## 2013-01-10 MED ORDER — HYDROCODONE-ACETAMINOPHEN 7.5-325 MG PO TABS: ORAL_TABLET | ORAL | Status: DC

## 2013-01-10 MED ORDER — VENLAFAXINE HCL 75 MG PO TABS: 75.0000 mg | ORAL_TABLET | Freq: Two times a day (BID) | ORAL | Status: DC

## 2013-01-13 ENCOUNTER — Encounter: Payer: Self-pay | Admitting: Obstetrics & Gynecology

## 2013-01-14 ENCOUNTER — Telehealth: Payer: Self-pay | Admitting: *Deleted

## 2013-01-14 NOTE — Telephone Encounter (Signed)
Message copied by Granville Lewis on Tue Jan 14, 2013  8:29 AM ------      Message from: Lesly Dukes      Created: Mon Jan 13, 2013  2:47 PM       US showns nml uterus and lining 4.5 mm.  Waiting for labs.  RN to call pt. ------

## 2013-01-14 NOTE — Telephone Encounter (Signed)
LM on cell phone that her U/S was normal and labs were pending.

## 2013-01-14 NOTE — Telephone Encounter (Signed)
Called pt to adv will wait 2 week for cycle to begin - if cycle doesn't come then she will need to have labs completed at that time - if cycle begins before 2 weeks then we need to do the [facility] on the 3rd day of cycle and will do rest of labs at the same time. [facility] for pt to call back with any questions

## 2013-01-21 ENCOUNTER — Telehealth: Payer: Self-pay

## 2013-01-21 MED ORDER — DIAZEPAM 10 MG PO TABS: 5.0000 mg | ORAL_TABLET | Freq: Two times a day (BID) | ORAL | Status: DC | PRN

## 2013-01-21 NOTE — Telephone Encounter (Signed)
Please tell her I am so so so  sorry. I will print new rx to fax over.

## 2013-01-21 NOTE — Telephone Encounter (Signed)
Her daughter was killed in a MVA on Sunday. She wants to take valium during the day due to her crying all the time. Is this ok? She may need a new prescription sent to [facility].

## 2013-01-21 NOTE — Telephone Encounter (Signed)
Patient advised.

## 2013-02-10 ENCOUNTER — Other Ambulatory Visit: Payer: Self-pay | Admitting: Family Medicine

## 2013-02-11 ENCOUNTER — Telehealth: Payer: Self-pay | Admitting: *Deleted

## 2013-02-11 NOTE — Telephone Encounter (Signed)
Called pt to see if she went to have labs drawn at lab - if not, she will need to come in office to have those completed. - LMOM for her to call back to [facility] to advise.

## 2013-02-12 ENCOUNTER — Telehealth: Payer: Self-pay | Admitting: *Deleted

## 2013-02-12 NOTE — Telephone Encounter (Signed)
t has not been back for labs because her daughter and granddaughter were killed in an MVA and she now has the other 2 children with her.  She said that she will come in on the 3rd day of her cycle.

## 2013-03-04 ENCOUNTER — Ambulatory Visit (INDEPENDENT_AMBULATORY_CARE_PROVIDER_SITE_OTHER): Payer: Worker's Compensation | Admitting: Family Medicine

## 2013-03-04 ENCOUNTER — Encounter: Payer: Self-pay | Admitting: Family Medicine

## 2013-03-04 VITALS — BP 118/80 | HR 89 | Wt 257.0 lb

## 2013-03-04 DIAGNOSIS — F329 Major depressive disorder, single episode, unspecified: Secondary | ICD-10-CM

## 2013-03-04 DIAGNOSIS — Z23 Encounter for immunization: Secondary | ICD-10-CM

## 2013-03-04 DIAGNOSIS — G4700 Insomnia, unspecified: Secondary | ICD-10-CM

## 2013-03-04 DIAGNOSIS — F341 Dysthymic disorder: Secondary | ICD-10-CM

## 2013-03-04 DIAGNOSIS — F419 Anxiety disorder, unspecified: Secondary | ICD-10-CM

## 2013-03-04 DIAGNOSIS — I10 Essential (primary) hypertension: Secondary | ICD-10-CM

## 2013-03-04 MED ORDER — VENLAFAXINE HCL 100 MG PO TABS: 100.0000 mg | ORAL_TABLET | Freq: Two times a day (BID) | ORAL | Status: DC

## 2013-03-04 MED ORDER — ZOLPIDEM TARTRATE 5 MG PO TABS: 5.0000 mg | ORAL_TABLET | Freq: Every evening | ORAL | Status: DC | PRN

## 2013-03-04 MED ORDER — HYDROCODONE-ACETAMINOPHEN 7.5-325 MG PO TABS: ORAL_TABLET | ORAL | Status: DC

## 2013-03-04 NOTE — Progress Notes (Signed)
Subjective:    Patient ID: Ashley Gomez, female    DOB: 12-18-1970, 42 y.o.   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 This is been very stressful for her. 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 They're very young. She is currently on Effexor and diazepam. She's having a lot of problems falling asleep. She's constantly crying and tearful. She is not doing any type of grief counseling. She is mostly using her Valium for sleep. It does help her fall asleep but does not help her stay asleep.  HTN - well controlled.  Pt denies chest pain, SOB, dizziness, or heart palpitations.  Taking meds as directed w/o problems.  Denies medication side effects.    Review of Systems  BP 118/80  Pulse 89  Wt 257 lb (116.574 kg)  BMI 41.5 kg/m2    Allergies  Allergen Reactions  . Ace Inhibitors     REACTION: cough  . Aspirin Other (See Comments)    Stomach Ulcer  . Atenolol Other (See Comments)    Bradycardia  . Codeine Nausea Only  . Phenytoin   . Sertraline Other (See Comments)    Past Medical History  Diagnosis Date  . Hypertension   . Depression   . Anxiety   . Seizures   . MS (multiple sclerosis)   . Obesity   . Smoking   . Migraines   . Facet hypertrophy of lumbar region     MRI 2007  . DVT (deep venous thrombosis) 12/13/2009    L basilic vein   . CVA (cerebral infarction)     Old CVA on MRI brain    Past Surgical History  Procedure Laterality Date  . Tubal ligation  1992  . Tonsillectomy      History   Social History  . Marital Status: Single    Spouse Name: N/A    Number of Children: N/A  . Years of Education: N/A   Occupational History  . Not on file.   Social History Main Topics  . Smoking status: Current Every Day Smoker -- 1.00 packs/day for 25 years    Types: Cigarettes  . Smokeless tobacco: Not on file  . Alcohol Use: Yes     Comment: occasional  . Drug Use: No  . Sexual Activity: No    Other Topics Concern  . Not on file   Social History Narrative  . No narrative on file    Family History  Problem Relation Age of Onset  . Cancer Mother 30    melanoma  . Hypertension Sister   . Heart disease Sister     valve disease  . Depression Sister   . Diabetes Sister     Outpatient Encounter Prescriptions as of 03/04/2013  Medication Sig Dispense Refill  . cyclobenzaprine (FLEXERIL) 10 MG tablet Take 1 tablet (10 mg total) by mouth at bedtime as needed for muscle spasms.  20 tablet  0  . diazepam (VALIUM) 10 MG tablet Take 0.5-1 tablets (5-10 mg total) by mouth every 12 (twelve) hours as needed for anxiety or sleep.  60 tablet  0  . gabapentin (NEURONTIN) 300 MG capsule Take 1 capsule (300 mg total) by mouth at bedtime.  30 capsule  2  . HYDROcodone-acetaminophen (NORCO) 7.5-325 MG per tablet TAKE ONE TABLET BY MOUTH EVERY EIGHT HOURS AS NEEDED FOR PAIN  90 tablet  0  . losartan (COZAAR) 50 MG tablet take 1 tablet  by mouth once daily  30 tablet  1  . promethazine (PHENERGAN) 50 MG tablet Take every 6 hours as needed for nausea or migraine.  30 tablet  0  . SUMAtriptan (IMITREX) 100 MG tablet Take 1 tablet (100 mg total) by mouth every 2 (two) hours as needed for migraine. No more than 2 in 24 hours.  10 tablet  2  . topiramate (TOPAMAX) 100 MG tablet Take 1 tablet (100 mg total) by mouth 2 (two) times daily.  60 tablet  2  . venlafaxine (EFFEXOR) 100 MG tablet Take 1 tablet (100 mg total) by mouth 2 (two) times daily.  60 tablet  1  . [DISCONTINUED] HYDROcodone-acetaminophen (NORCO) 7.5-325 MG per tablet TAKE ONE TABLET BY MOUTH EVERY EIGHT HOURS AS NEEDED FOR PAIN   90 tablet  0  . [DISCONTINUED] venlafaxine (EFFEXOR) 75 MG tablet Take 1 tablet (75 mg total) by mouth 2 (two) times daily.  60 tablet  1  . zolpidem (AMBIEN) 5 MG tablet Take 1 tablet (5 mg total) by mouth at bedtime as needed for sleep.  30 tablet  0   No facility-administered encounter medications on file  as of 03/04/2013.          Objective:   Physical Exam  Constitutional: She is oriented to person, place, and time. She appears well-developed and well-nourished.  HENT:  Head: Normocephalic and atraumatic.  Cardiovascular: Normal rate, regular rhythm and normal heart sounds.   Pulmonary/Chest: Effort normal and breath sounds normal.  Neurological: She is alert and oriented to person, place, and time.  Skin: Skin is warm and dry.  Psychiatric: She has a normal mood and affect. Her behavior is normal.          Assessment & Plan:  Anxiety/depression-gad 7 score of 21 and PHQ 9 score of 22. She has not had any suicidal thoughts or thoughts of hurting herself. Certainly the death of her daughter is been a major tragic event in her life. I think she would benefit from grieving counseling. We did discuss increasing her Effexor 200 mg twice a day. Though I am not convinced that making medication changes we'll make a big difference in her mood. Followup 1-2  months.  Grieving - discussed that I really think that counseling could be helpful for her. Encouraged her to at least think about it.  Insomnia - will add Ambien for sleep at 5 mg. One about potential side effects and sedation of the medication. She's to stop it immediately if she experiences any side effects. This could help her sleep quality. Warned about the addictive potential of this medication and to use sparingly. Followup in one to 2 months.  HTN - well controlled. Continue current regimen. Followup in 6 months.  Time spent 25 minutes, greater than 50% of the time spent counseling about anxiety/depression/grieving/insomnia/blood pressure.

## 2013-03-05 LAB — TESTOSTERONE
Testosterone: 26 ng/dL (ref 10–70)
Testosterone: 27 ng/dL (ref 10–70)

## 2013-03-05 LAB — DHEA-SULFATE: DHEA-SO4: 86 ug/dL (ref 35–430)

## 2013-03-05 LAB — PROLACTIN: Prolactin: 4.3 ng/mL

## 2013-03-05 LAB — FOLLICLE STIMULATING HORMONE: FSH: 18.1 m[IU]/mL

## 2013-03-06 ENCOUNTER — Telehealth: Payer: Self-pay | Admitting: *Deleted

## 2013-03-06 MED ORDER — TRAZODONE HCL 50 MG PO TABS: 25.0000 mg | ORAL_TABLET | Freq: Every evening | ORAL | Status: DC | PRN

## 2013-03-06 NOTE — Telephone Encounter (Signed)
Pt lvm asking that something else be called in for sleep b/c Ambien is too expensive.[name]

## 2013-03-06 NOTE — Telephone Encounter (Signed)
New rx sent for trazodone

## 2013-03-08 LAB — 17-HYDROXYPROGESTERONE: 17-OH-Progesterone, LC/MS/MS: 10 ng/dL

## 2013-03-10 NOTE — Telephone Encounter (Signed)
Pt.notified

## 2013-03-11 ENCOUNTER — Other Ambulatory Visit: Payer: Self-pay | Admitting: Family Medicine

## 2013-03-12 LAB — ESTRADIOL, FREE
Estradiol, Free: 0.2 pg/mL
Estradiol: 9 pg/mL

## 2013-04-01 ENCOUNTER — Ambulatory Visit: Payer: Self-pay | Admitting: Family Medicine

## 2013-04-01 ENCOUNTER — Telehealth: Payer: Self-pay | Admitting: *Deleted

## 2013-04-01 NOTE — Telephone Encounter (Signed)
Message copied by Granville Lewis on Tue Apr 01, 2013 10:06 AM ------      Message from: Lesly Dukes      Created: Mon Mar 31, 2013  9:00 AM       RN Chestine Spore to call patient and see how her bleeding is / if seh is having menopausal symptoms. ------

## 2013-04-01 NOTE — Telephone Encounter (Signed)
Pt notified of recent labs results per Dr Penne Lash.  Her last period was 10/17 instead of 10/28 and it lasted with moderate bleeding for 9 days.  She does mention that she is having hot flashes and insomnia.  The insomnia is probably related to the family trauma that she has recently gone through with losing her daughter and grandson.  She talks more about her depression meds and that she has an upcoming appt with Dr Linford Arnold to f/u on her depression meds.  She stated that she will call us if her periods get heavy again or very irregular.

## 2013-04-08 ENCOUNTER — Ambulatory Visit (INDEPENDENT_AMBULATORY_CARE_PROVIDER_SITE_OTHER): Payer: Self-pay | Admitting: Family Medicine

## 2013-04-08 ENCOUNTER — Encounter: Payer: Self-pay | Admitting: Family Medicine

## 2013-04-08 VITALS — BP 129/88 | HR 96 | Temp 97.3°F | Ht 66.0 in | Wt 268.0 lb

## 2013-04-08 DIAGNOSIS — G4700 Insomnia, unspecified: Secondary | ICD-10-CM

## 2013-04-08 DIAGNOSIS — F4321 Adjustment disorder with depressed mood: Secondary | ICD-10-CM

## 2013-04-08 DIAGNOSIS — R635 Abnormal weight gain: Secondary | ICD-10-CM

## 2013-04-08 MED ORDER — HYDROCODONE-ACETAMINOPHEN 7.5-325 MG PO TABS: ORAL_TABLET | ORAL | Status: DC

## 2013-04-08 MED ORDER — CYCLOBENZAPRINE HCL 10 MG PO TABS: ORAL_TABLET | ORAL | Status: DC

## 2013-04-08 MED ORDER — LOSARTAN POTASSIUM 50 MG PO TABS: ORAL_TABLET | ORAL | Status: DC

## 2013-04-08 MED ORDER — SUMATRIPTAN SUCCINATE 100 MG PO TABS: 100.0000 mg | ORAL_TABLET | ORAL | Status: DC | PRN

## 2013-04-08 MED ORDER — DIAZEPAM 10 MG PO TABS: ORAL_TABLET | ORAL | Status: DC

## 2013-04-08 NOTE — Progress Notes (Signed)
  Subjective:    Patient ID: Ashley Gomez, female    DOB: 1970-06-03, 42 y.o.   MRN: [id number]  HPI Ashley Gomez - last time I saw her we increased Effexor to 200mg .  But only started the 200mg  about a week ago.  Strongly encouraged her to think about doing some grieving counseling.Marland Kitchen She went to think about it. She was also having significant problems sleeping. Evaluate with help and was not helping her stay asleep. Thus we decided to try Ambien 5 mg. Ambien was too expensive so she called her office back and we changed to trazodone instead. Has only been able try it a couple of times. Has been using her valium up to TID. She has gained a lot of weight but says she is hardly eating.     Review of Systems     Objective:   Physical Exam  Constitutional: She appears well-developed and well-nourished.  HENT:  Head: Normocephalic and atraumatic.  Neurological: She is alert.  Skin: Skin is warm and dry.  Psychiatric: She has a normal mood and affect. Her behavior is normal.          Assessment & Plan:  Anxiety/depression-Previous gad 7 score of 21 and PHQ 9 score of 22. Today Gad-7 score of 23, and GAD-7 score of 21.  She has not had any suicidal thoughts or thoughts of hurting herself. Certainly the death of her daughter is been a major tragic event in her life. She is only been on the Effexor 100 mg twice a day for one week. I would like her to try for total of 3 weeks and then give me a call to let me know she feels it's helpful. If it's not then we will change the medication to something else. Followup in 6 weeks.  Grieving -Encouraged hre to consider counseling through hospice.   Insomnia - try the trazodone. I really think improving her sleep quality to help with her anxiety, mood as well as her weight.  Abnormal weight gain -will check thyroid to make sure that it's okay. She did have hormone testing recently that shows that she is not postmenopausal yet. Also consider this could be a  side effect of the Effexor. Awake when is quite rapid but certainly that the consideration. She can try the increased dose on the Effexor for least 2 more weeks and if she's not showing some significant improvement in mood and we will wean this medication and try something else. Try get some regular exercise as well as regular sleep. Also explained how high stress levels increase cortisol levels which in turn is a most like taking prednisone can cause weight gain.

## 2013-04-09 LAB — CORTISOL-AM, BLOOD: Cortisol - AM: 9.2 ug/dL (ref 4.3–22.4)

## 2013-04-09 LAB — TSH: TSH: 0.84 u[IU]/mL (ref 0.350–4.500)

## 2013-04-23 ENCOUNTER — Telehealth: Payer: Self-pay

## 2013-04-23 ENCOUNTER — Ambulatory Visit: Payer: Self-pay | Admitting: Physician Assistant

## 2013-04-23 ENCOUNTER — Telehealth: Payer: Self-pay | Admitting: Family Medicine

## 2013-04-23 MED ORDER — HYDROCODONE-HOMATROPINE 5-1.5 MG/5ML PO SYRP: 5.0000 mL | ORAL_SOLUTION | Freq: Every evening | ORAL | Status: DC | PRN

## 2013-04-23 NOTE — Telephone Encounter (Signed)
Patient can not come in. She wants to know if there is anything she can take for a cough.

## 2013-04-23 NOTE — Telephone Encounter (Signed)
Pt called and wants to know if we can call in cough syrup to [facility]. She made an appt with Lesly Rubenstein then cancelled because she did not have anyone to watch kids for her.

## 2013-04-23 NOTE — Telephone Encounter (Signed)
Pt given Hycodan to take at bedtime per Dr. Linford Arnold. LMOM for pt that it needs to be picked up and that she can call me back.  Meyer Cory, LPN

## 2013-04-23 NOTE — Telephone Encounter (Signed)
Patient advised of Dr Shelah Lewandowsky cough medications recommendations.

## 2013-05-12 ENCOUNTER — Ambulatory Visit (INDEPENDENT_AMBULATORY_CARE_PROVIDER_SITE_OTHER): Payer: Self-pay | Admitting: Physician Assistant

## 2013-05-12 ENCOUNTER — Telehealth: Payer: Self-pay | Admitting: *Deleted

## 2013-05-12 ENCOUNTER — Other Ambulatory Visit: Payer: Self-pay | Admitting: Family Medicine

## 2013-05-12 ENCOUNTER — Encounter: Payer: Self-pay | Admitting: Physician Assistant

## 2013-05-12 ENCOUNTER — Other Ambulatory Visit: Payer: Self-pay

## 2013-05-12 VITALS — BP 136/83 | HR 101 | Temp 98.4°F | Wt 264.0 lb

## 2013-05-12 DIAGNOSIS — R197 Diarrhea, unspecified: Secondary | ICD-10-CM

## 2013-05-12 DIAGNOSIS — F17200 Nicotine dependence, unspecified, uncomplicated: Secondary | ICD-10-CM

## 2013-05-12 DIAGNOSIS — J45909 Unspecified asthma, uncomplicated: Secondary | ICD-10-CM

## 2013-05-12 DIAGNOSIS — M5430 Sciatica, unspecified side: Secondary | ICD-10-CM

## 2013-05-12 MED ORDER — DIAZEPAM 10 MG PO TABS: ORAL_TABLET | ORAL | Status: DC

## 2013-05-12 MED ORDER — DIPHENOXYLATE-ATROPINE 2.5-0.025 MG PO TABS: 1.0000 | ORAL_TABLET | Freq: Four times a day (QID) | ORAL | Status: DC | PRN

## 2013-05-12 MED ORDER — HYDROCODONE-ACETAMINOPHEN 7.5-325 MG PO TABS: ORAL_TABLET | ORAL | Status: DC

## 2013-05-12 MED ORDER — AMOXICILLIN-POT CLAVULANATE 875-125 MG PO TABS: 1.0000 | ORAL_TABLET | Freq: Two times a day (BID) | ORAL | Status: DC

## 2013-05-12 MED ORDER — PREDNISONE 50 MG PO TABS: ORAL_TABLET | ORAL | Status: DC

## 2013-05-12 MED ORDER — LOSARTAN POTASSIUM 50 MG PO TABS: ORAL_TABLET | ORAL | Status: DC

## 2013-05-12 MED ORDER — DOXYCYCLINE HYCLATE 100 MG PO CAPS: 100.0000 mg | ORAL_CAPSULE | Freq: Two times a day (BID) | ORAL | Status: DC

## 2013-05-12 MED ORDER — CYCLOBENZAPRINE HCL 10 MG PO TABS: ORAL_TABLET | ORAL | Status: DC

## 2013-05-12 NOTE — Progress Notes (Signed)
   Subjective:    Patient ID: Ashley Gomez, female    DOB: 07-19-1970, 42 y.o.   MRN: [id number]  HPI Pt presents to the clinic with ongoing cough for the last 2 weeks. Called office and cough syrup was sent and helped but cough has continued and worsened. She has on and off cold chills. She is coughing so bad she pees on herself every time. Denies any SOB but has had some wheezing when she gets up or lays down. Cough is productive white/yellow sputum. She is a smoker. No fever, n/v/, ear pain or sinus pressure. She has had diarrhea for past week or so. Tried OTC treatment immodium is helping. No blood in stool. No abdominal pain.   Dr. Linford Arnold had given Dexilant for acid reflux symptoms and helped but when she went to get filled going to be 200 dollars. Wants something else.     Review of Systems     Objective:   Physical Exam  Constitutional: She appears well-developed and well-nourished.  HENT:  Head: Normocephalic and atraumatic.  Right Ear: External ear normal.  Left Ear: External ear normal.  Nose: Nose normal.  Mouth/Throat: Oropharynx is clear and moist.  TM's clear bilaterally.   Negative for any maxillary or frontal sinus tenderness.   Eyes: Conjunctivae are normal. Right eye exhibits no discharge. Left eye exhibits no discharge.  Neck: Normal range of motion. Neck supple.  Cardiovascular: Regular rhythm and normal heart sounds.   Tachycardia at 101.   Pulmonary/Chest:  Decreased air movement. Expiratory wheezing heard bilaterally. Productive cough throughout exam.   Abdominal: Soft. Bowel sounds are normal. There is no tenderness.  Lymphadenopathy:    She has no cervical adenopathy.  Skin: Skin is warm and dry.  Psychiatric: She has a normal mood and affect. Her behavior is normal.          Assessment & Plan:  Bronchitis- Pt states zpak has not worked historically. I sent doxycycline to pharmacy. Pt called back and doxy was too expensive so augmentin was sent.  Prednisone was given for 5 days. Pt cannot afford albuterol but her son has nebulizer machine and a lot of solution. She will use for next couple of days up to every 4-6 hours. Declined nebulizer in office due to cost. Hycodan sent again for cough at bedtime.   Acid reflux- nexium samples given to try. Discussed omeprazole and nexium are both OTC. Diet changes can also help acid reflux symptoms.   Sciatica/neck pain- Pt request refill on hydrocodone. Refill given today.   Diarrhea- lomotoil given. Call if not resolved after finishing abx. Discussed abx can cause diarrhea. Could be combination of meds or IBS symptoms.

## 2013-05-12 NOTE — Telephone Encounter (Signed)
Will send over Augmentin.

## 2013-05-12 NOTE — Telephone Encounter (Signed)
Pt.notified

## 2013-05-12 NOTE — Patient Instructions (Signed)
Acute Bronchitis Bronchitis is inflammation of the airways that extend from the windpipe into the lungs (bronchi). The inflammation often causes mucus to develop. This leads to a cough, which is the most common symptom of bronchitis.  In acute bronchitis, the condition usually develops suddenly and goes away over time, usually in a couple weeks. Smoking, allergies, and asthma can make bronchitis worse. Repeated episodes of bronchitis may cause further lung problems.  CAUSES Acute bronchitis is most often caused by the same virus that causes a cold. The virus can spread from person to person (contagious).  SIGNS AND SYMPTOMS   Cough.   Fever.   Coughing up mucus.   Body aches.   Chest congestion.   Chills.   Shortness of breath.   Sore throat.  DIAGNOSIS  Acute bronchitis is usually diagnosed through a physical exam. Tests, such as chest X-rays, are sometimes done to rule out other conditions.  TREATMENT  Acute bronchitis usually goes away in a couple weeks. Often times, no medical treatment is necessary. Medicines are sometimes given for relief of fever or cough. Antibiotics are usually not needed but may be prescribed in certain situations. In some cases, an inhaler may be recommended to help reduce shortness of breath and control the cough. A cool mist vaporizer may also be used to help thin bronchial secretions and make it easier to clear the chest.  HOME CARE INSTRUCTIONS  Get plenty of rest.   Drink enough fluids to keep your urine clear or pale yellow (unless you have a medical condition that requires fluid restriction). Increasing fluids may help thin your secretions and will prevent dehydration.   Only take over-the-counter or prescription medicines as directed by your health care provider.   Avoid smoking and secondhand smoke. Exposure to cigarette smoke or irritating chemicals will make bronchitis worse. If you are a smoker, consider using nicotine gum or skin  patches to help control withdrawal symptoms. Quitting smoking will help your lungs heal faster.   Reduce the chances of another bout of acute bronchitis by washing your hands frequently, avoiding people with cold symptoms, and trying not to touch your hands to your mouth, nose, or eyes.   Follow up with your health care provider as directed.  SEEK MEDICAL CARE IF: Your symptoms do not improve after 1 week of treatment.  SEEK IMMEDIATE MEDICAL CARE IF:  You develop an increased fever or chills.   You have chest pain.   You have severe shortness of breath.  You have bloody sputum.   You develop dehydration.  You develop fainting.  You develop repeated vomiting.  You develop a severe headache. MAKE SURE YOU:   Understand these instructions.  Will watch your condition.  Will get help right away if you are not doing well or get worse. Document Released: 06/08/2004 Document Revised: 01/01/2013 Document Reviewed: 10/22/2012 [employer] Patient Information 2014 [employer].  

## 2013-05-12 NOTE — Telephone Encounter (Signed)
Pt left message stating that the abx you sent for her today is $60 & wants to know if you could send something cheaper.

## 2013-05-12 NOTE — Telephone Encounter (Signed)
I can send over zpak or augmentin ;however, pt reported zpak did not historically work for her and Augmentin is not the best for lungs but could try. Which would she prefer.

## 2013-05-12 NOTE — Telephone Encounter (Signed)
Pt said that she's ok with either abx.  That you could send over which ever one you feel is best for what she has.

## 2013-05-20 ENCOUNTER — Encounter: Payer: Self-pay | Admitting: Family Medicine

## 2013-05-20 ENCOUNTER — Ambulatory Visit (INDEPENDENT_AMBULATORY_CARE_PROVIDER_SITE_OTHER): Payer: Self-pay | Admitting: Family Medicine

## 2013-05-20 VITALS — BP 137/86 | HR 102 | Temp 98.9°F | Wt 265.0 lb

## 2013-05-20 DIAGNOSIS — F4321 Adjustment disorder with depressed mood: Secondary | ICD-10-CM

## 2013-05-20 DIAGNOSIS — J209 Acute bronchitis, unspecified: Secondary | ICD-10-CM

## 2013-05-20 DIAGNOSIS — E669 Obesity, unspecified: Secondary | ICD-10-CM

## 2013-05-20 NOTE — Progress Notes (Signed)
   Subjective:    Patient ID: Ashley Gomez, female    DOB: [birth year], 43 y.o.   MRN: [id number]  HPI Followup grieving today. She's somewhat tearful today. She says her grandchildren are starting to get some counseling. She is still not sleeping well.  She has only used the trazodone a couple of times.  Still taking her valium every day when first wakes up. She typically takes it twice a day.    Bronchitis - Still has bodyachesa and sweats but no fever.  Stil some cough and some wheezing.  She is some better. She still has a few days of augmentin and finished the steroids. She would like me to listen to her lungs today to make sure that she's getting better.  Weight gain-she's gained 11 pounds and starting Effexor and is very concerned about this. Her last thyroid check was 2 months ago and was normal.   Review of Systems     Objective:   Physical Exam  Constitutional: She is oriented to person, place, and time. She appears well-developed and well-nourished.  HENT:  Head: Normocephalic and atraumatic.  Cardiovascular: Normal rate, regular rhythm and normal heart sounds.   Pulmonary/Chest: Effort normal and breath sounds normal.  Neurological: She is alert and oriented to person, place, and time.  Skin: Skin is warm and dry.  Psychiatric: She has a normal mood and affect. Her behavior is normal.          Assessment & Plan:  Grieving-gad 7 score of 19 today. Previous was 21. PHQ 9 score of 22 today. Previous was 23.  Still not well controlled. I would really like her to focus on getting a good night sleep. This is very difficult and she has 4 kids that she takes care of better in the home. All of which are younger than 5. She does have some help from her daughter and son-in-law. The trazodone does seem to work but she rarely uses it. Encouraged her to sometimes take a nap when the children are taking a nap. She says typically this time for her to get things done I encouraged her to  still think about getting some rest at least a couple times a week if she's able to. She is back involved with her church which is wonderful. But still does not want to really leave the house very often. I still think she would benefit from counseling, but she feels she does not have the time.  Weight gain-she's gained about 11 pounds and starting Effexor is very concerned about this. She no she's not very physically active or at least as she should be. She really feels like she eats okay and doesn't eat too much. She does not drink sodas. I explained that overall Effexor is fairly weight neutral but there are some people he did gain weight on it. We could certainly look at stopping it or changing it if she would like. She says she just filled a 30 day prescriptions would like to give it one more month and see how she does. She continues to gain weight then we will definitely change it.  Acute bronchitis-overall she is improving. Still some residual postinfectious cough. Gave her reassurance. Make sure complete the antibiotic. And she is now off the steroids.

## 2013-06-02 ENCOUNTER — Other Ambulatory Visit: Payer: Self-pay | Admitting: Family Medicine

## 2013-06-09 ENCOUNTER — Telehealth: Payer: Self-pay | Admitting: *Deleted

## 2013-06-09 MED ORDER — LOSARTAN POTASSIUM 50 MG PO TABS: ORAL_TABLET | ORAL | Status: DC

## 2013-06-09 MED ORDER — DIAZEPAM 10 MG PO TABS: ORAL_TABLET | ORAL | Status: DC

## 2013-06-09 MED ORDER — HYDROCODONE-ACETAMINOPHEN 7.5-325 MG PO TABS: ORAL_TABLET | ORAL | Status: DC

## 2013-06-09 NOTE — Telephone Encounter (Signed)
Pt called to get meds refilled.Ashley Gomez [facility]

## 2013-07-07 ENCOUNTER — Encounter: Payer: Self-pay | Admitting: Family Medicine

## 2013-07-07 ENCOUNTER — Ambulatory Visit: Payer: Self-pay | Admitting: Family Medicine

## 2013-07-07 ENCOUNTER — Ambulatory Visit (INDEPENDENT_AMBULATORY_CARE_PROVIDER_SITE_OTHER): Payer: Self-pay | Admitting: Family Medicine

## 2013-07-07 VITALS — BP 135/74 | HR 93 | Wt 275.0 lb

## 2013-07-07 DIAGNOSIS — F418 Other specified anxiety disorders: Secondary | ICD-10-CM

## 2013-07-07 DIAGNOSIS — R609 Edema, unspecified: Secondary | ICD-10-CM

## 2013-07-07 DIAGNOSIS — K5900 Constipation, unspecified: Secondary | ICD-10-CM

## 2013-07-07 DIAGNOSIS — F341 Dysthymic disorder: Secondary | ICD-10-CM

## 2013-07-07 MED ORDER — HYDROCODONE-ACETAMINOPHEN 7.5-325 MG PO TABS: ORAL_TABLET | ORAL | Status: DC

## 2013-07-07 MED ORDER — LOSARTAN POTASSIUM 50 MG PO TABS: ORAL_TABLET | ORAL | Status: DC

## 2013-07-07 MED ORDER — DIAZEPAM 10 MG PO TABS: 5.0000 mg | ORAL_TABLET | Freq: Three times a day (TID) | ORAL | Status: DC | PRN

## 2013-07-07 MED ORDER — CYCLOBENZAPRINE HCL 10 MG PO TABS: ORAL_TABLET | ORAL | Status: DC

## 2013-07-07 MED ORDER — TOPIRAMATE 100 MG PO TABS: 100.0000 mg | ORAL_TABLET | Freq: Two times a day (BID) | ORAL | Status: DC

## 2013-07-07 MED ORDER — DIAZEPAM 10 MG PO TABS: ORAL_TABLET | ORAL | Status: DC

## 2013-07-07 MED ORDER — SUMATRIPTAN SUCCINATE 100 MG PO TABS: 100.0000 mg | ORAL_TABLET | ORAL | Status: DC | PRN

## 2013-07-07 MED ORDER — HYDROCHLOROTHIAZIDE 12.5 MG PO TABS
25.0000 mg | ORAL_TABLET | Freq: Every day | ORAL | Status: DC | PRN
Start: 2013-07-07 — End: 2013-08-04

## 2013-07-07 NOTE — Patient Instructions (Signed)
Decrease to half tab daily twice a day for 5 days, then decrease to 1/2 tabs once a day for 5 days. Then 1/4 of a tab for 5 days, then stop. Once get to the 1/4 tab then can start the new medication

## 2013-07-07 NOTE — Progress Notes (Signed)
   Subjective:    Patient ID: Ashley Gomez, female    DOB: [birth year], 43 y.o.   MRN: [id number]  HPI F/U depression/anxiety - She has gained 10 more lbs on the effexor for a total of 20 lbs. GAD- 7 score of 20.  Has decreased appetitie. Has been retaining fluid in her hands and ankles. She has been avoding salt. Says really doesn't eat much, Has tried paxil, prozac, and zoloft.  No shortness of breath or chest pain. Her sister was recently diagnosed with Sjogren's.  Lab Results  Component Value Date   TSH 0.840 04/08/2013   She also is having problems with her bowels. She is currently battling with some constipation and slow gut. She wonders if this could even be contributing to some of her weight gain. She says the stools become a little bit firm the she still goes daily. Though at other times she has more loose stools for couple days and time. She denies any blood in the stool. She has not tried anything over-the-counter. Review of Systems     Objective:   Physical Exam  Constitutional: She is oriented to person, place, and time. She appears well-developed and well-nourished.  HENT:  Head: Normocephalic and atraumatic.  Eyes: Conjunctivae and EOM are normal.  Cardiovascular: Normal rate.   Pulmonary/Chest: Effort normal.  Neurological: She is alert and oriented to person, place, and time.  Skin: Skin is dry. No pallor.  Psychiatric: She has a normal mood and affect. Her behavior is normal.          Assessment & Plan:  Anxiety/depression - Not well controlled and has gained about 20 lbs.  Will wean the effexor.  Will start Viibryd.  I did go ahead and refill the Valium today and increased to 3 times a day while we are adjusting her medication regimen and taking her off the Effexor and starting her on Viibryd. Taper written out and copy provided. PHQ 9 score of 23 today. Gad 7 score of 20 today.  Constipation. Recommended a trial of over-the-counter MiraLax since it is a  non-stimulant laxative. Also discussed the importance of fiber since her bowels tend to swim between diarrhea and constipation. I explained how this helps the stool it helps keep the stools a little bit more consistent.  Swelling - most likely secondary to rapid weight gain. Will give her small quantity hydrochlorothiazide he uses occasionally for swelling. Continue to avoid salt. Cannot use more than 2-3 times a week. Hopefully if she's able to lose weight after discontinuing the Effexor that will help with the fluid retention she is currently experiencing.

## 2013-08-04 ENCOUNTER — Telehealth: Payer: Self-pay | Admitting: Family Medicine

## 2013-08-04 ENCOUNTER — Other Ambulatory Visit: Payer: Self-pay | Admitting: *Deleted

## 2013-08-04 ENCOUNTER — Encounter: Payer: Self-pay | Admitting: Sports Medicine

## 2013-08-04 ENCOUNTER — Ambulatory Visit (INDEPENDENT_AMBULATORY_CARE_PROVIDER_SITE_OTHER): Payer: Self-pay | Admitting: Sports Medicine

## 2013-08-04 VITALS — BP 131/85 | HR 96 | Ht 67.0 in | Wt 276.0 lb

## 2013-08-04 DIAGNOSIS — M2240 Chondromalacia patellae, unspecified knee: Secondary | ICD-10-CM

## 2013-08-04 DIAGNOSIS — M2242 Chondromalacia patellae, left knee: Secondary | ICD-10-CM

## 2013-08-04 MED ORDER — DIAZEPAM 10 MG PO TABS: 5.0000 mg | ORAL_TABLET | Freq: Three times a day (TID) | ORAL | Status: DC | PRN

## 2013-08-04 MED ORDER — LOSARTAN POTASSIUM 50 MG PO TABS: ORAL_TABLET | ORAL | Status: DC

## 2013-08-04 MED ORDER — HYDROCHLOROTHIAZIDE 25 MG PO TABS: 25.0000 mg | ORAL_TABLET | Freq: Every day | ORAL | Status: DC | PRN

## 2013-08-04 MED ORDER — HYDROCODONE-ACETAMINOPHEN 7.5-325 MG PO TABS: ORAL_TABLET | ORAL | Status: DC

## 2013-08-04 MED ORDER — CYCLOBENZAPRINE HCL 10 MG PO TABS: ORAL_TABLET | ORAL | Status: DC

## 2013-08-04 NOTE — Telephone Encounter (Signed)
This has been taken care of.Ashley Gomez [place]' 

## 2013-08-04 NOTE — Telephone Encounter (Signed)
Patient is seeing Dr. Darene Lamer today for knee but she advised that she called last thurs/friday 3/19/-3/20-2015 and need all prescriptions. Thanks

## 2013-08-04 NOTE — Progress Notes (Signed)
  Subjective:    CC: Left knee pain  HPI: Patient is a 43 yo female with a 2 week history of exacerbation of knee pain. Patient has a 2 year long history of knee pain after a fall. Pain did improve after physical therapy at that time. Two weeks ago the pain has worsened. Pain with sitting and raising from a sitting position. Describes pain as moderate to severe sharp pain in the anterior knee. Patient complains of knee feeling full and swollen. Denies any trauma or recurrent injury 2 weeks ago.  Past medical history, Surgical history, Family history not pertinant except as noted below, Social history, Allergies, and medications have been entered into the medical record, reviewed, and no changes needed.   Review of Systems: No fevers, chills, night sweats, weight loss, chest pain, or shortness of breath.   Objective:    General: Well Developed, well nourished, and in no acute distress.  Neuro: Alert and oriented x3, extra-ocular muscles intact, sensation grossly intact.  HEENT: Normocephalic, atraumatic, pupils equal round reactive to light, neck supple, no masses, no lymphadenopathy, thyroid nonpalpable.  Skin: Warm and dry, no rashes. Cardiac: Regular rate and rhythm, no murmurs rubs or gallops, no lower extremity edema.  Respiratory: Clear to auscultation bilaterally. Not using accessory muscles, speaking in full sentences. Left Knee: Mild swelling in the anterior knee. Tender to palpation over anterior joint line. Pain with flexion and extension. Positive patellar crepitus.  Procedure: Real-time Ultrasound Guided Injection of left knee Device: GE Logiq E  Verbal informed consent obtained.  Time-out conducted.  Noted no overlying erythema, induration, or other signs of local infection.  Skin prepped in a sterile fashion.  Local anesthesia: Topical Ethyl chloride.  With sterile technique and under real time ultrasound guidance:  2 cc Kenalog 40, 4 cc lidocaine injected easily into the  suprapatellar recess. Completed without difficulty  Pain immediately resolved suggesting accurate placement of the medication.  Advised to call if fevers/chills, erythema, induration, drainage, or persistent bleeding.  Images permanently stored and available for review in the ultrasound unit.  Impression: Technically successful ultrasound guided injection.  Impression and Recommendations:

## 2013-08-04 NOTE — Assessment & Plan Note (Signed)
Ultrasound guided injection, formal physical therapy. Return to see me in one month.

## 2013-08-05 ENCOUNTER — Telehealth: Payer: Self-pay

## 2013-08-05 NOTE — Telephone Encounter (Signed)
Patient called stated that her whole left leg is more swollen and she thinks it is coming from injection she received yesterday she wants to know what she should do she also stated that she has note been able to sleep because it hurts really bad. Suanne Marker Cunningham,CMA

## 2013-08-05 NOTE — Telephone Encounter (Signed)
Sounds like an entity called steroid flare which occasionally happens in a very small percentage of patients. This is not an infection, this will resolve in a day, with icing, 800 mg of ibuprofen 3 times a day. Also she should wrap it with an Ace bandage.

## 2013-08-05 NOTE — Telephone Encounter (Signed)
Well, tough it out with tylenol and ice.

## 2013-08-05 NOTE — Telephone Encounter (Signed)
Spoke to patient gave her instructions as noted below. Leonila Speranza,CMA  

## 2013-08-05 NOTE — Telephone Encounter (Signed)
Patient stated that she is allergic to all Nsaids. Clint Strupp,CMA

## 2013-09-02 ENCOUNTER — Other Ambulatory Visit: Payer: Self-pay | Admitting: Family Medicine

## 2013-09-03 ENCOUNTER — Ambulatory Visit (INDEPENDENT_AMBULATORY_CARE_PROVIDER_SITE_OTHER): Payer: Self-pay | Admitting: Family Medicine

## 2013-09-03 ENCOUNTER — Encounter: Payer: Self-pay | Admitting: Family Medicine

## 2013-09-03 VITALS — BP 134/70 | HR 95 | Wt 265.0 lb

## 2013-09-03 DIAGNOSIS — F4321 Adjustment disorder with depressed mood: Secondary | ICD-10-CM

## 2013-09-03 MED ORDER — CITALOPRAM HYDROBROMIDE 20 MG PO TABS: ORAL_TABLET | ORAL | Status: DC

## 2013-09-03 MED ORDER — HYDROCODONE-ACETAMINOPHEN 7.5-325 MG PO TABS: ORAL_TABLET | ORAL | Status: DC

## 2013-09-03 MED ORDER — TOPIRAMATE 100 MG PO TABS: 100.0000 mg | ORAL_TABLET | Freq: Two times a day (BID) | ORAL | Status: DC

## 2013-09-03 MED ORDER — DIAZEPAM 10 MG PO TABS: 5.0000 mg | ORAL_TABLET | Freq: Two times a day (BID) | ORAL | Status: DC | PRN
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
## 2013-09-08 ENCOUNTER — Ambulatory Visit: Payer: Self-pay | Admitting: Family Medicine

## 2013-09-09 ENCOUNTER — Telehealth: Payer: Self-pay | Admitting: *Deleted

## 2013-09-09 NOTE — Telephone Encounter (Signed)
Pt given recommendations she does not have a DVT however, pt does have hx of peptic ulcers and this was another concern of whether or not she should take the mobic.Teddy Spike .

## 2013-09-09 NOTE — Telephone Encounter (Signed)
I will route this to PCP but if a blood clot was found, was it a superficial venous thrombosis or a deep venous thrombosis? If it was a DVT, then she should be on a blood thinner, and no Mobic should not be taken. If it was a superficial venous thrombosis, yes she can take any NSAID or her choice, I do not see Mobic on her allergy list, hydrocodone would be okay to take as well.

## 2013-09-09 NOTE — Telephone Encounter (Signed)
Pt called and stated that she had to go to the ED last night and found a blood clot on her knee. They put her on mobic and tramadol and she is unable to take tramadol and wanted to know if taking the mobic and hydrocodone will do the same thing as the tramadol? She said this is the same knee that she had the blood clot in before and was just calling for advice about if what she is taking will be sufficient if there was something else that dr Madilyn Fireman recommended she is willing to try that also. She has contacted Dr. Tobin Chad

## 2013-09-09 NOTE — Telephone Encounter (Signed)
Ok to take if takes with prilosec or prevaci for the duration to protect the gut.

## 2013-09-09 NOTE — Telephone Encounter (Signed)
dpends on the type of blood clot If DVT then can't take mobic bc can't take with other blood thinners.  If superficial clot then can take the mobic

## 2013-09-09 NOTE — Telephone Encounter (Signed)
Pt called and stated that the [facility] called and wanted to know if it would be ok for her to take mobic since it is on her list of allgeries. She was told to check with her pcp about this.Ashley Gomez

## 2013-09-09 NOTE — Telephone Encounter (Signed)
Patient left another message on vm wanting to know if it was ok for her to take Mobic that was prescribed by ED. Pragya Lofaso,CMA

## 2013-09-10 NOTE — Telephone Encounter (Signed)
Spoke to patient advised as noted below. Leigha Olberding,CMA

## 2013-09-15 ENCOUNTER — Encounter: Payer: Self-pay | Admitting: Sports Medicine

## 2013-09-15 ENCOUNTER — Ambulatory Visit (INDEPENDENT_AMBULATORY_CARE_PROVIDER_SITE_OTHER): Payer: Self-pay | Admitting: Sports Medicine

## 2013-09-15 VITALS — BP 121/80 | HR 78 | Ht 63.0 in | Wt 268.0 lb

## 2013-09-15 DIAGNOSIS — I809 Phlebitis and thrombophlebitis of unspecified site: Secondary | ICD-10-CM | POA: Insufficient documentation

## 2013-09-15 MED ORDER — AMBULATORY NON FORMULARY MEDICATION: Status: DC

## 2013-09-15 NOTE — Assessment & Plan Note (Signed)
Mostly on the left lower extremity. Compression hose, strapped with compressive dressing today. Samples of Duexis and Vimovo given for GI protection. Return in 2 weeks to reassess.

## 2013-09-15 NOTE — Patient Instructions (Signed)

## 2013-09-15 NOTE — Progress Notes (Signed)
   Subjective:    I'm seeing this patient as a consultation for:  Lucianne Muss, MD, [facility].  CC: Left leg pain  HPI: Left knee osteoarthritis/patellofemoral syndrome: Improved significantly after injection.  Left lower leg pain: For 2 weeks has had redness and swelling, no IV drug use, no to the skin. She was seen in the emergency department where ultrasound was negative for DVT, but positive for superficial thrombophlebitis. Pain is severe, persistent, localized.  Past medical history, Surgical history, Family history not pertinant except as noted below, Social history, Allergies, and medications have been entered into the medical record, reviewed, and no changes needed.   Review of Systems: No headache, visual changes, nausea, vomiting, diarrhea, constipation, dizziness, abdominal pain, skin rash, fevers, chills, night sweats, weight loss, swollen lymph nodes, body aches, joint swelling, muscle aches, chest pain, shortness of breath, mood changes, visual or auditory hallucinations.   Objective:   General: Well Developed, well nourished, and in no acute distress.  Neuro/Psych: Alert and oriented x3, extra-ocular muscles intact, able to move all 4 extremities, sensation grossly intact. Skin: Warm and dry, no rashes noted.  Respiratory: Not using accessory muscles, speaking in full sentences, trachea midline.  Cardiovascular: Pulses palpable, no extremity edema. Abdomen: Does not appear distended. Left lower leg: There is a discrete area of erythema, with palpable and tender cordlike structures on the posterior, medial calf. Negative Homans sign. No tenderness to palpation over the joint line.  The lower leg was strapped with compressive dressings.  Impression and Recommendations:   This case required medical decision making of moderate complexity.

## 2013-09-24 ENCOUNTER — Telehealth: Payer: Self-pay | Admitting: *Deleted

## 2013-09-24 NOTE — Telephone Encounter (Signed)
Pt called and said that she cannot take the Celexa it causes her to feel dizzy. She wanted to know if Dr. Madilyn Fireman had spoken with the drug rep about the other med that she was given samples of. She said that really helped her. Please advise.Ashley Gomez

## 2013-09-25 NOTE — Telephone Encounter (Signed)
Pt informed of recommendations.Ashley Gomez L Ashley Gomez  

## 2013-09-25 NOTE — Telephone Encounter (Signed)
Can pikc up samples fo Viibryd. I have enough for about 7 weeks. If maybe she could try to fill the prescription occasionally. I really please see how much her co-pay is maybe if she can take for it every other month or every 3 months in between and we can provide enough samples to keep her on it. Unfortunately I did speak with the representative and they do not have sample bottles of 40 mg which is really what she needs. She can try to apply for patient assistance through the drug company. She would have to go online to their website called for a [facility] pharmaceuticals and see if they have any patient assistance programs for which she can apply.

## 2013-09-25 NOTE — Telephone Encounter (Signed)
Pt called back and reports that she has developed a rash and headache (may be due to grand kids 1 st day at day care) she is not taking any of her depression meds.  She wanted to know if she should continue to take the 20 mg or move up to 40. Please advise.Teddy Spike

## 2013-10-01 ENCOUNTER — Ambulatory Visit (INDEPENDENT_AMBULATORY_CARE_PROVIDER_SITE_OTHER): Payer: Self-pay | Admitting: Sports Medicine

## 2013-10-01 ENCOUNTER — Ambulatory Visit (INDEPENDENT_AMBULATORY_CARE_PROVIDER_SITE_OTHER): Payer: Self-pay | Admitting: Family Medicine

## 2013-10-01 ENCOUNTER — Other Ambulatory Visit: Payer: Self-pay | Admitting: Family Medicine

## 2013-10-01 ENCOUNTER — Telehealth: Payer: Self-pay | Admitting: *Deleted

## 2013-10-01 ENCOUNTER — Other Ambulatory Visit: Payer: Self-pay | Admitting: *Deleted

## 2013-10-01 ENCOUNTER — Encounter: Payer: Self-pay | Admitting: Sports Medicine

## 2013-10-01 ENCOUNTER — Encounter: Payer: Self-pay | Admitting: Family Medicine

## 2013-10-01 VITALS — BP 127/72 | HR 99 | Wt 261.0 lb

## 2013-10-01 DIAGNOSIS — I809 Phlebitis and thrombophlebitis of unspecified site: Secondary | ICD-10-CM

## 2013-10-01 DIAGNOSIS — R21 Rash and other nonspecific skin eruption: Secondary | ICD-10-CM

## 2013-10-01 DIAGNOSIS — F329 Major depressive disorder, single episode, unspecified: Secondary | ICD-10-CM

## 2013-10-01 DIAGNOSIS — I10 Essential (primary) hypertension: Secondary | ICD-10-CM

## 2013-10-01 DIAGNOSIS — F32A Depression, unspecified: Secondary | ICD-10-CM

## 2013-10-01 DIAGNOSIS — F3289 Other specified depressive episodes: Secondary | ICD-10-CM

## 2013-10-01 MED ORDER — CYCLOBENZAPRINE HCL 10 MG PO TABS: ORAL_TABLET | ORAL | Status: DC

## 2013-10-01 MED ORDER — DIAZEPAM 10 MG PO TABS: 5.0000 mg | ORAL_TABLET | Freq: Two times a day (BID) | ORAL | Status: DC | PRN

## 2013-10-01 MED ORDER — SUMATRIPTAN SUCCINATE 100 MG PO TABS: 100.0000 mg | ORAL_TABLET | ORAL | Status: DC | PRN

## 2013-10-01 MED ORDER — LOSARTAN POTASSIUM 50 MG PO TABS: ORAL_TABLET | ORAL | Status: DC

## 2013-10-01 MED ORDER — HYDROCODONE-ACETAMINOPHEN 7.5-325 MG PO TABS: ORAL_TABLET | ORAL | Status: DC

## 2013-10-01 MED ORDER — HYDROCHLOROTHIAZIDE 25 MG PO TABS: ORAL_TABLET | ORAL | Status: DC

## 2013-10-01 MED ORDER — VILAZODONE HCL 40 MG PO TABS: 40.0000 mg | ORAL_TABLET | Freq: Every day | ORAL | Status: DC

## 2013-10-01 NOTE — Telephone Encounter (Signed)
Form faxed and placed in scan.Ashley Gomez

## 2013-10-01 NOTE — Telephone Encounter (Signed)
Calling to inform pt that she will need to submit additional information for the medication assistance. Pt stated that she had spoken with someone and was told to go ahead and submit the information and they will get back with her with any additional information that is needed. I will fax her forms and rx.Teddy Spike

## 2013-10-01 NOTE — Assessment & Plan Note (Signed)
Symptoms are resolving. Continue NSAIDs and intermittent use of lower extremity compression stockings. She does continue to have the palpable cord along the distribution of the great saphenous vein.

## 2013-10-01 NOTE — Progress Notes (Signed)
   Subjective:    Patient ID: Ashley Gomez, female    DOB: 02-07-1971, 43 y.o.   MRN: [id number]  HPI She has a rash on her abdomen.  She is not sure if from the citalpram or the medication that Dr. Darene Lamer had given her for pain.  Also caused the headache for about 3-4 weeks. The rash is still there and only on her chest but very itchey.  Has also had diarrhea on and off for several weeks.  Not put a cream on it. No worsening or alleviating factors. She had a few new box popped out about 2-3 days ago but wasn't sure if that was just from scratching. Kids are at daycare.  Has been going to grief counseling. Stoppe her citalorpam about a week ago.    Followup depression-she still is constantly tearful. She has been going to her grief counseling though she only has one more meaning that's part of the heart strings program. She still feels depressed every day and feels that her interest or pleasure. Her motivation is very low. Her grandchildren are now going to day care says she actually feels a little bit sad about this because now she is home alone a little bit longer which allows her more time to think about her daughter's passing. She has been on the viibryd for a week now.    Hypertension- Pt denies chest pain, SOB, dizziness, or heart palpitations.  Taking meds as directed w/o problems.  Denies medication side effects.      Review of Systems     Objective:   Physical Exam  Constitutional: She is oriented to person, place, and time. She appears well-developed and well-nourished.  HENT:  Head: Normocephalic and atraumatic.  Eyes: Conjunctivae and EOM are normal.  Cardiovascular: Normal rate.   Pulmonary/Chest: Effort normal.  Neurological: She is alert and oriented to person, place, and time.  Skin: Skin is dry. Rash noted. No pallor.  Fine erythematous papules on upper chest ans middle of left breast.  Some excoriations and fine scabs.   Psychiatric: She has a normal mood and affect. Her  behavior is normal.          Assessment & Plan:  Rash - unclear if drug rash is only on her chest. Recommend a trial of over-the-counter hydrocortisone to chair he has a tube of this. To avoid scratching. I'm not sure that will from the citalopram as she's been off of it for a little over a week.  Depression - Now back on viibryd which worked well on her. I'm glad she is back on this medication. She is going to complete forms to apply for assistance through the drug manufacturer. She will provide his forms today. Her PHQ 9 score today is 26 and her gad 7 score is 19.  Hypertension-well-controlled. Continue current regimen. Refill sent to pharmacy. Followup in 6 months.

## 2013-10-01 NOTE — Assessment & Plan Note (Signed)
Continues to do well after injection 2 months ago, she did have an initial steroid flare with triamcinolone so for future injections we will need to use Depo-Medrol.

## 2013-10-01 NOTE — Progress Notes (Signed)
  Subjective:    CC: Followup  HPI: Left leg superficial thrombophlebitis: Continue to improve with NSAIDs and intermittent use of compression.  Left knee patellofemoral chondromalacia: Continues to do well after injection, she did have a steroid flare with triamcinolone, need Depo-Medrol for future injections.  Past medical history, Surgical history, Family history not pertinant except as noted below, Social history, Allergies, and medications have been entered into the medical record, reviewed, and no changes needed.   Review of Systems: No fevers, chills, night sweats, weight loss, chest pain, or shortness of breath.   Objective:    General: Well Developed, well nourished, and in no acute distress.  Neuro: Alert and oriented x3, extra-ocular muscles intact, sensation grossly intact.  HEENT: Normocephalic, atraumatic, pupils equal round reactive to light, neck supple, no masses, no lymphadenopathy, thyroid nonpalpable.  Skin: Warm and dry, no rashes. Cardiac: Regular rate and rhythm, no murmurs rubs or gallops, no lower extremity edema.  Respiratory: Clear to auscultation bilaterally. Not using accessory muscles, speaking in full sentences. Left leg: I am still able to palpate the clotted great saphenous vein, it is no longer tender to palpation  Impression and Recommendations:

## 2013-10-10 ENCOUNTER — Telehealth: Payer: Self-pay | Admitting: *Deleted

## 2013-10-10 NOTE — Telephone Encounter (Signed)
Pt called and informed that her medication came in and are up front for p/u.Teddy Spike

## 2013-10-31 ENCOUNTER — Other Ambulatory Visit: Payer: Self-pay | Admitting: *Deleted

## 2013-10-31 MED ORDER — TOPIRAMATE 100 MG PO TABS: 100.0000 mg | ORAL_TABLET | Freq: Two times a day (BID) | ORAL | Status: DC

## 2013-10-31 MED ORDER — HYDROCHLOROTHIAZIDE 25 MG PO TABS: ORAL_TABLET | ORAL | Status: DC

## 2013-10-31 MED ORDER — LOSARTAN POTASSIUM 50 MG PO TABS: ORAL_TABLET | ORAL | Status: DC

## 2013-10-31 MED ORDER — DIAZEPAM 10 MG PO TABS: 5.0000 mg | ORAL_TABLET | Freq: Two times a day (BID) | ORAL | Status: DC | PRN

## 2013-11-06 ENCOUNTER — Other Ambulatory Visit: Payer: Self-pay | Admitting: *Deleted

## 2013-11-06 MED ORDER — HYDROCODONE-ACETAMINOPHEN 7.5-325 MG PO TABS: ORAL_TABLET | ORAL | Status: DC

## 2013-11-07 ENCOUNTER — Ambulatory Visit (INDEPENDENT_AMBULATORY_CARE_PROVIDER_SITE_OTHER): Payer: Self-pay | Admitting: Family Medicine

## 2013-11-07 ENCOUNTER — Encounter: Payer: Self-pay | Admitting: Family Medicine

## 2013-11-07 VITALS — BP 117/80 | HR 89 | Ht 66.0 in | Wt 263.0 lb

## 2013-11-07 DIAGNOSIS — R21 Rash and other nonspecific skin eruption: Secondary | ICD-10-CM

## 2013-11-07 MED ORDER — CEPHALEXIN 500 MG PO CAPS: 500.0000 mg | ORAL_CAPSULE | Freq: Four times a day (QID) | ORAL | Status: DC

## 2013-11-07 NOTE — Progress Notes (Signed)
   Subjective:    Patient ID: Ashley Gomez, female    DOB: 02-05-1971, 43 y.o.   MRN: [id number]  HPI She had celexa reaction. Has hives and has been very itchey. Unfortunately, the rash has persisted. It only on her abdomen. Snore else on her body. He continues to be very itchy and she has been scratching at it. She's been off of the Celexa for almost 5 weeks at this point. Says has been staying with her sister and hasn't stayed in a hotel.  She has beeen using triple antibiotic.  No fevers chills or sweats.     Review of Systems     Objective:   Physical Exam  Constitutional: She is oriented to person, place, and time. She appears well-developed and well-nourished.  HENT:  Head: Normocephalic and atraumatic.  Neurological: She is alert and oriented to person, place, and time.  Skin: Skin is warm and dry. Rash noted.  Scattered fine erythematous rash over the abdomen. She has a lesion on the left flank and near the right flank and just above the suprapubic area that's more inflamed and slightly raised. The 2 lesions on the upper outer upper abdomen have a small hematoma.   Psychiatric: She has a normal mood and affect. Her behavior is normal.          Assessment & Plan:  Rash - unclear etiology. I do not think that this is a drug reaction since it has persisted most of weeks off of the Celexa. The 3 lesions that she is concerned about look like they are infected. Most likely from excoriating and scratching. We'll treat with Keflex to cover for staph infection. She can start to use Benadryl over-the-counter cream. If not improving in the next week then please let me know. Consider treating for scabies if not improving.

## 2013-11-10 ENCOUNTER — Encounter: Payer: Self-pay | Admitting: Sports Medicine

## 2013-11-10 ENCOUNTER — Ambulatory Visit (INDEPENDENT_AMBULATORY_CARE_PROVIDER_SITE_OTHER): Payer: Self-pay | Admitting: Sports Medicine

## 2013-11-10 VITALS — BP 125/90 | HR 113 | Ht 66.0 in | Wt 257.0 lb

## 2013-11-10 DIAGNOSIS — L02512 Cutaneous abscess of left hand: Secondary | ICD-10-CM | POA: Insufficient documentation

## 2013-11-10 DIAGNOSIS — L03119 Cellulitis of unspecified part of limb: Secondary | ICD-10-CM

## 2013-11-10 DIAGNOSIS — M2240 Chondromalacia patellae, unspecified knee: Secondary | ICD-10-CM

## 2013-11-10 DIAGNOSIS — L02519 Cutaneous abscess of unspecified hand: Secondary | ICD-10-CM

## 2013-11-10 MED ORDER — NAPROXEN-ESOMEPRAZOLE 500-20 MG PO TBEC
1.0000 | DELAYED_RELEASE_TABLET | Freq: Two times a day (BID) | ORAL | Status: DC
Start: 2013-11-10 — End: 2014-01-05

## 2013-11-10 MED ORDER — DOXYCYCLINE HYCLATE 100 MG PO TABS: 100.0000 mg | ORAL_TABLET | Freq: Two times a day (BID) | ORAL | Status: AC

## 2013-11-10 NOTE — Progress Notes (Signed)
  Subjective:    CC: Followup  HPI: Bilateral knee pain:  Patellofemoral with osteoarthritis. Injection was performed approximately 4 months ago, she had a fantastic response and desires repeat injection. Pain is localized under the kneecap, moderate, persistent. Worse with weightbearing.  Left hand pain: Initially started with a pimple, now is painful, reddish, fluctuant. She failed a course of Keflex already.  Past medical history, Surgical history, Family history not pertinant except as noted below, Social history, Allergies, and medications have been entered into the medical record, reviewed, and no changes needed.   Review of Systems: No fevers, chills, night sweats, weight loss, chest pain, or shortness of breath.   Objective:    General: Well Developed, well nourished, and in no acute distress.  Neuro: Alert and oriented x3, extra-ocular muscles intact, sensation grossly intact.  HEENT: Normocephalic, atraumatic, pupils equal round reactive to light, neck supple, no masses, no lymphadenopathy, thyroid nonpalpable.  Skin: Warm and dry, no rashes. Cardiac: Regular rate and rhythm, no murmurs rubs or gallops, no lower extremity edema.  Respiratory: Clear to auscultation bilaterally. Not using accessory muscles, speaking in full sentences. Left hand: There is a palpable and visible small, 1 cm abscess that is fluctuant, warm, and tender on the volar first webspace.  Procedure: Real-time Ultrasound Guided Injection of right knee  Device: GE Logiq E  Verbal informed consent obtained.  Time-out conducted.  Noted no overlying erythema, induration, or other signs of local infection.  Skin prepped in a sterile fashion.  Local anesthesia: Topical Ethyl chloride.  With sterile technique and under real time ultrasound guidance:  1 cc Depo-Medrol 40, 4 cc lidocaine injected easily.  Completed without difficulty  Pain immediately resolved suggesting accurate placement of the medication.    Advised to call if fevers/chills, erythema, induration, drainage, or persistent bleeding.  Images permanently stored and available for review in the ultrasound unit.  Impression: Technically successful ultrasound guided injection.  Procedure: Real-time Ultrasound Guided Injection of left knee  Device: GE Logiq E  Verbal informed consent obtained.  Time-out conducted.  Noted no overlying erythema, induration, or other signs of local infection.  Skin prepped in a sterile fashion.  Local anesthesia: Topical Ethyl chloride.  With sterile technique and under real time ultrasound guidance:  1 cc Depo-Medrol 40, 4 cc lidocaine injected easily.  Completed without difficulty  Pain immediately resolved suggesting accurate placement of the medication.  Advised to call if fevers/chills, erythema, induration, drainage, or persistent bleeding.  Images permanently stored and available for review in the ultrasound unit.  Impression: Technically successful ultrasound guided injection.  Impression and Recommendations:

## 2013-11-10 NOTE — Assessment & Plan Note (Signed)
Bilateral knee injection as above with Depo-Medrol. Refilling the Vimovo Return as needed.

## 2013-11-10 NOTE — Assessment & Plan Note (Signed)
First webspace. Failed cephalexin, switching to doxycycline. If no improvement by the end of the week I will do an incision and drainage.

## 2013-12-05 ENCOUNTER — Ambulatory Visit (INDEPENDENT_AMBULATORY_CARE_PROVIDER_SITE_OTHER): Payer: Self-pay | Admitting: Sports Medicine

## 2013-12-05 ENCOUNTER — Encounter: Payer: Self-pay | Admitting: Family Medicine

## 2013-12-05 ENCOUNTER — Ambulatory Visit (INDEPENDENT_AMBULATORY_CARE_PROVIDER_SITE_OTHER): Payer: Self-pay | Admitting: Family Medicine

## 2013-12-05 VITALS — BP 122/78 | HR 101 | Ht 66.0 in | Wt 248.0 lb

## 2013-12-05 DIAGNOSIS — F32A Depression, unspecified: Secondary | ICD-10-CM

## 2013-12-05 DIAGNOSIS — I10 Essential (primary) hypertension: Secondary | ICD-10-CM

## 2013-12-05 DIAGNOSIS — F3289 Other specified depressive episodes: Secondary | ICD-10-CM

## 2013-12-05 DIAGNOSIS — L308 Other specified dermatitis: Secondary | ICD-10-CM

## 2013-12-05 DIAGNOSIS — M2240 Chondromalacia patellae, unspecified knee: Secondary | ICD-10-CM

## 2013-12-05 DIAGNOSIS — H9390 Unspecified disorder of ear, unspecified ear: Secondary | ICD-10-CM

## 2013-12-05 DIAGNOSIS — L299 Pruritus, unspecified: Secondary | ICD-10-CM

## 2013-12-05 DIAGNOSIS — F329 Major depressive disorder, single episode, unspecified: Secondary | ICD-10-CM

## 2013-12-05 DIAGNOSIS — H9391 Unspecified disorder of right ear: Secondary | ICD-10-CM

## 2013-12-05 DIAGNOSIS — R21 Rash and other nonspecific skin eruption: Secondary | ICD-10-CM

## 2013-12-05 MED ORDER — HYDROCODONE-ACETAMINOPHEN 7.5-325 MG PO TABS: ORAL_TABLET | ORAL | Status: DC

## 2013-12-05 MED ORDER — DIAZEPAM 10 MG PO TABS: 5.0000 mg | ORAL_TABLET | Freq: Two times a day (BID) | ORAL | Status: DC | PRN

## 2013-12-05 MED ORDER — TOPIRAMATE 100 MG PO TABS: 100.0000 mg | ORAL_TABLET | Freq: Two times a day (BID) | ORAL | Status: DC

## 2013-12-05 MED ORDER — HYDROCHLOROTHIAZIDE 25 MG PO TABS: ORAL_TABLET | ORAL | Status: DC

## 2013-12-05 MED ORDER — LOSARTAN POTASSIUM 50 MG PO TABS: ORAL_TABLET | ORAL | Status: DC

## 2013-12-05 NOTE — Assessment & Plan Note (Signed)
Right knee is doing well, left knee still has some pain. MRI. Return to go over results.

## 2013-12-05 NOTE — Progress Notes (Signed)
   Subjective:    Patient ID: Ashley Gomez, female    DOB: [birth year], 43 y.o.   MRN: [id number]  HPI Depression - she is back on Viibryd.  Doing well. No side effects of the drug. Sleep is fair. She's under a lot of stress today. She is currently living with her daughter and son-in-law. She says she was kicked out this morning. She says she's not sure where she is going to stay.  Right ear with crackling noise.  No trauma or injury. No hearing loss.  No trauma to the ear. No recent colds. No fevers chills or sweats.   Rash Still present but better. Still has sig itching on her forearms nad between her pannus.  Very itchey. Trying not to scratch.  We have addressed previsously. See notes.  Initially was thought to be a drug reaction to citalopram. She has been off of that. The rash is overall better but still some present. Then felt like it might be staff because she had been scratching and scratching at the lesions. Was treated with a course of Keflex about 6 weeks ago. She still has a few scattered papules on her forearms most of them are excoriated. She also has a few papules on her abdomen under the crease on the pannus and again they are excoriated. She had been using Allegra for relief but to switch to Benadryl a couple of days ago and that has been helpful.  Review of Systems     Objective:   Physical Exam  Constitutional: She appears well-developed and well-nourished.  HENT:  Head: Normocephalic and atraumatic.  Skin: Skin is warm and dry. Rash noted.   She still has a few scattered papules on her forearms most of them are excoriated. She also has a few papules on her abdomen under the crease on the pannus and again they are excoriated.   Psychiatric: She has a normal mood and affect. Her behavior is normal.          Assessment & Plan:  Right ear crackling-exam is normal. We'll perform tympanometry. Tympanometry was normal as well. Gave reassurance. Ms. likely eustachian tube  dysfunction. Recommend a trial of a nasal steroid spray. Sample of Qnasl given today. If it's not improving then please let me know. He is Re: taking an antihistamine as well.  Depression - well-controlled on current regimen of Viibryd. Followup in 3 or 4 months.  Rash-KOH scraping performed today. Still consider fungal and even scabies as she has not really had any exposures to suspect scabies. We'll call with results once available.

## 2013-12-05 NOTE — Progress Notes (Signed)
  Subjective:    CC:  Followup  HPI: Bilateral knee pain: Clinically diagnosed with chondromalacia, failed therapy, last injection was a month ago, right side is doing well, left side still hurting.  Hand abscess: Resolved.  Past medical history, Surgical history, Family history not pertinant except as noted below, Social history, Allergies, and medications have been entered into the medical record, reviewed, and no changes needed.   Review of Systems: No fevers, chills, night sweats, weight loss, chest pain, or shortness of breath.   Objective:    General: Well Developed, well nourished, and in no acute distress.  Neuro: Alert and oriented x3, extra-ocular muscles intact, sensation grossly intact.  HEENT: Normocephalic, atraumatic, pupils equal round reactive to light, neck supple, no masses, no lymphadenopathy, thyroid nonpalpable.  Skin: Warm and dry, no rashes. Cardiac: Regular rate and rhythm, no murmurs rubs or gallops, no lower extremity edema.  Respiratory: Clear to auscultation bilaterally. Not using accessory muscles, speaking in full sentences. Left Knee: Normal to inspection with no erythema or effusion or obvious bony abnormalities. Tender to palpation along the joint lines. ROM normal in flexion and extension and lower leg rotation. Ligaments with solid consistent endpoints including ACL, PCL, LCL, MCL. Negative Mcmurray's and provocative meniscal tests. Non painful patellar compression. Patellar and quadriceps tendons unremarkable. Hamstring and quadriceps strength is normal.  Impression and Recommendations:

## 2013-12-06 LAB — COMPLETE METABOLIC PANEL WITH GFR
ALT: 8 U/L (ref 0–35)
AST: 15 U/L (ref 0–37)
Albumin: 4 g/dL (ref 3.5–5.2)
Alkaline Phosphatase: 47 U/L (ref 39–117)
BUN: 16 mg/dL (ref 6–23)
CO2: 27 mEq/L (ref 19–32)
Calcium: 9.2 mg/dL (ref 8.4–10.5)
Chloride: 101 mEq/L (ref 96–112)
Creat: 0.8 mg/dL (ref 0.50–1.10)
GFR, Est African American: >89 mL/min
GFR, Est Non African American: >89 mL/min
Glucose, Bld: 100 mg/dL — ABNORMAL HIGH (ref 70–99)
Potassium: 3.2 mEq/L — ABNORMAL LOW (ref 3.5–5.3)
Sodium: 139 mEq/L (ref 135–145)
Total Bilirubin: 0.4 mg/dL (ref 0.2–1.2)
Total Protein: 6.6 g/dL (ref 6.0–8.3)

## 2013-12-06 LAB — KOH PREP: RESULT - KOH: NONE SEEN

## 2013-12-08 ENCOUNTER — Other Ambulatory Visit: Payer: Self-pay | Admitting: Family Medicine

## 2013-12-08 ENCOUNTER — Telehealth: Payer: Self-pay

## 2013-12-08 MED ORDER — TRIAMCINOLONE ACETONIDE 0.5 % EX OINT: 1.0000 "application " | TOPICAL_OINTMENT | Freq: Two times a day (BID) | CUTANEOUS | Status: DC

## 2013-12-08 NOTE — Telephone Encounter (Signed)
Called pt cell # and could not leave message on phone so I called her home phone and left a message to return my call so I can get her insurance information./ Estil Daft

## 2013-12-08 NOTE — Progress Notes (Signed)
Pt informed with understand./Ashley Gomez,CMA

## 2013-12-31 ENCOUNTER — Telehealth: Payer: Self-pay | Admitting: *Deleted

## 2013-12-31 ENCOUNTER — Ambulatory Visit (INDEPENDENT_AMBULATORY_CARE_PROVIDER_SITE_OTHER): Payer: Self-pay

## 2013-12-31 DIAGNOSIS — M2240 Chondromalacia patellae, unspecified knee: Secondary | ICD-10-CM

## 2013-12-31 DIAGNOSIS — M2390 Unspecified internal derangement of unspecified knee: Secondary | ICD-10-CM

## 2013-12-31 NOTE — Telephone Encounter (Addendum)
Pt called requesting refill of viibryd, vicodin.Audelia Hives [place]

## 2014-01-05 ENCOUNTER — Encounter: Payer: Self-pay | Admitting: Sports Medicine

## 2014-01-05 ENCOUNTER — Ambulatory Visit (INDEPENDENT_AMBULATORY_CARE_PROVIDER_SITE_OTHER): Payer: Self-pay | Admitting: Sports Medicine

## 2014-01-05 ENCOUNTER — Other Ambulatory Visit: Payer: Self-pay | Admitting: *Deleted

## 2014-01-05 VITALS — BP 125/81 | HR 83 | Ht 66.0 in | Wt 251.0 lb

## 2014-01-05 DIAGNOSIS — M2240 Chondromalacia patellae, unspecified knee: Secondary | ICD-10-CM

## 2014-01-05 MED ORDER — DIAZEPAM 10 MG PO TABS: 5.0000 mg | ORAL_TABLET | Freq: Two times a day (BID) | ORAL | Status: DC | PRN

## 2014-01-05 MED ORDER — HYDROCODONE-ACETAMINOPHEN 7.5-325 MG PO TABS: ORAL_TABLET | ORAL | Status: DC

## 2014-01-05 NOTE — Assessment & Plan Note (Signed)
Continued good response to Depo-Medrol injection on the right knee. Left knee continues to have pain despite steroid injection so we are going to switch to OrthoVisc. OrthoVisc injection #1 given today, return one week for #2.

## 2014-01-05 NOTE — Progress Notes (Signed)
  Subjective:    CC: MRI results  HPI: Left knee pain: Ashley Gomez did very well after a bilateral Depo-Medrol injection, her right knee is pain-free but she is having a recurrence of pain in the left knee, pain is moderate, persistent, localized under the kneecap on the lateral aspect, we obtained an MRI to further evaluate any internal derangement. Continues to have pain that is patellofemoral in quality. Moderate, persistent.  Past medical history, Surgical history, Family history not pertinant except as noted below, Social history, Allergies, and medications have been entered into the medical record, reviewed, and no changes needed.   Review of Systems: No fevers, chills, night sweats, weight loss, chest pain, or shortness of breath.   Objective:    General: Well Developed, well nourished, and in no acute distress.  Neuro: Alert and oriented x3, extra-ocular muscles intact, sensation grossly intact.  HEENT: Normocephalic, atraumatic, pupils equal round reactive to light, neck supple, no masses, no lymphadenopathy, thyroid nonpalpable.  Skin: Warm and dry, no rashes. Cardiac: Regular rate and rhythm, no murmurs rubs or gallops, no lower extremity edema.  Respiratory: Clear to auscultation bilaterally. Not using accessory muscles, speaking in full sentences.  MRI does show progression of the patellofemoral chondromalacia, there is edema in the meniscus but no discrete tear.  Procedure: Real-time Ultrasound Guided Injection of left knee Device: GE Logiq E  Verbal informed consent obtained.  Time-out conducted.  Noted no overlying erythema, induration, or other signs of local infection.  Skin prepped in a sterile fashion.  Local anesthesia: Topical Ethyl chloride.  With sterile technique and under real time ultrasound guidance:   30 mg/2 mL of OrthoVisc (sodium hyaluronate) in a prefilled syringe was injected easily into the knee through a 22-gauge needle. Completed without difficulty  Pain  immediately resolved suggesting accurate placement of the medication.  Advised to call if fevers/chills, erythema, induration, drainage, or persistent bleeding.  Images permanently stored and available for review in the ultrasound unit.  Impression: Technically successful ultrasound guided injection.  Impression and Recommendations:

## 2014-01-12 ENCOUNTER — Ambulatory Visit: Payer: Self-pay | Admitting: Sports Medicine

## 2014-01-13 ENCOUNTER — Encounter: Payer: Self-pay | Admitting: Sports Medicine

## 2014-01-13 ENCOUNTER — Ambulatory Visit (INDEPENDENT_AMBULATORY_CARE_PROVIDER_SITE_OTHER): Payer: Self-pay | Admitting: Sports Medicine

## 2014-01-13 VITALS — BP 109/70 | HR 90 | Ht 66.0 in | Wt 248.0 lb

## 2014-01-13 DIAGNOSIS — F329 Major depressive disorder, single episode, unspecified: Secondary | ICD-10-CM

## 2014-01-13 DIAGNOSIS — F3289 Other specified depressive episodes: Secondary | ICD-10-CM

## 2014-01-13 DIAGNOSIS — M2240 Chondromalacia patellae, unspecified knee: Secondary | ICD-10-CM

## 2014-01-13 MED ORDER — NORTRIPTYLINE HCL 50 MG PO CAPS: 50.0000 mg | ORAL_CAPSULE | Freq: Every day | ORAL | Status: DC

## 2014-01-13 NOTE — Assessment & Plan Note (Addendum)
There is some significant worsening depression despite Viibryd. She does not have insurance so we will need to pick something cheap, I do think we need to add noradrenergic reuptake inhibition to her current serotonergic agonism, adding nortriptyline.

## 2014-01-13 NOTE — Assessment & Plan Note (Signed)
OrthoVisc injection #2 a 4 into the left knee. There is some significant worsening depression despite Viibryd. Adding nortriptyline.

## 2014-01-13 NOTE — Progress Notes (Signed)
  Subjective:    CC: OrthoVisc injection  HPI: Beya returns for OrthoVisc injection #2 into the left knee, pain is 50% better, she was tearful, and this led to a further discussion about her depression. She has not had insurance, has been on Zoloft, amitriptyline, Celexa, Lexapro, is currently on Viibryd doing okay, she denies suicidal or homicidal ideation but she is nearing one year anniversary of her daughter's death in a motor vehicle accident. She is significantly tearful and does endorse worsening pain all over her body. Symptoms are moderate, persistent.  Past medical history, Surgical history, Family history not pertinant except as noted below, Social history, Allergies, and medications have been entered into the medical record, reviewed, and no changes needed.   Review of Systems: No fevers, chills, night sweats, weight loss, chest pain, or shortness of breath.   Objective:    General: Well Developed, well nourished, and in no acute distress.  Neuro: Alert and oriented x3, extra-ocular muscles intact, sensation grossly intact.  HEENT: Normocephalic, atraumatic, pupils equal round reactive to light, neck supple, no masses, no lymphadenopathy, thyroid nonpalpable.  Skin: Warm and dry, no rashes. Cardiac: Regular rate and rhythm, no murmurs rubs or gallops, no lower extremity edema.  Respiratory: Clear to auscultation bilaterally. Not using accessory muscles, speaking in full sentences.  Procedure: Real-time Ultrasound Guided Injection of left knee Device: GE Logiq E  Verbal informed consent obtained.  Time-out conducted.  Noted no overlying erythema, induration, or other signs of local infection.  Skin prepped in a sterile fashion.  Local anesthesia: Topical Ethyl chloride.  With sterile technique and under real time ultrasound guidance:   30 mg/2 mL of OrthoVisc (sodium hyaluronate) in a prefilled syringe was injected easily into the knee through a 22-gauge needle. Completed  without difficulty  Pain immediately resolved suggesting accurate placement of the medication.  Advised to call if fevers/chills, erythema, induration, drainage, or persistent bleeding.  Images permanently stored and available for review in the ultrasound unit.  Impression: Technically successful ultrasound guided injection.  Impression and Recommendations:

## 2014-01-14 ENCOUNTER — Telehealth: Payer: Self-pay | Admitting: *Deleted

## 2014-01-14 NOTE — Telephone Encounter (Signed)
Called and informed pt that her viibryd was her and up front ready for p/u.Elouise Munroe'

## 2014-01-15 ENCOUNTER — Ambulatory Visit: Payer: Self-pay | Admitting: Sports Medicine

## 2014-01-20 ENCOUNTER — Encounter: Payer: Self-pay | Admitting: Sports Medicine

## 2014-01-20 ENCOUNTER — Ambulatory Visit (INDEPENDENT_AMBULATORY_CARE_PROVIDER_SITE_OTHER): Payer: Self-pay | Admitting: Sports Medicine

## 2014-01-20 ENCOUNTER — Telehealth: Payer: Self-pay

## 2014-01-20 VITALS — BP 112/73 | HR 78 | Wt 247.0 lb

## 2014-01-20 DIAGNOSIS — M2240 Chondromalacia patellae, unspecified knee: Secondary | ICD-10-CM

## 2014-01-20 DIAGNOSIS — F3289 Other specified depressive episodes: Secondary | ICD-10-CM

## 2014-01-20 DIAGNOSIS — F329 Major depressive disorder, single episode, unspecified: Secondary | ICD-10-CM

## 2014-01-20 NOTE — Assessment & Plan Note (Signed)
Has not yet taken nortriptyline, worsening depression despite Viibryd.

## 2014-01-20 NOTE — Telephone Encounter (Signed)
Bruising is completely normal, ice it for 20 minutes 3-4 times a day, this will go away.

## 2014-01-20 NOTE — Telephone Encounter (Signed)
Cypress reports increased swelling, pain and bruising on the left knee since the injection. She states this has never happened before and is concerned. Is this abnormal? Please advise.

## 2014-01-20 NOTE — Assessment & Plan Note (Signed)
OrthoVisc injection #3 as above, return in one week for injection #4

## 2014-01-20 NOTE — Progress Notes (Signed)
  Subjective:    CC: OrthoVisc injection  HPI: Depression/anniversary syndrome: Has not yet taken nortriptyline, has already failed multiple psychotropics.  Left knee osteoarthritis: Here for OrthoVisc injection #3  Past medical history, Surgical history, Family history not pertinant except as noted below, Social history, Allergies, and medications have been entered into the medical record, reviewed, and no changes needed.   Review of Systems: No fevers, chills, night sweats, weight loss, chest pain, or shortness of breath.   Objective:    General: Well Developed, well nourished, and in no acute distress.  Neuro: Alert and oriented x3, extra-ocular muscles intact, sensation grossly intact.  HEENT: Normocephalic, atraumatic, pupils equal round reactive to light, neck supple, no masses, no lymphadenopathy, thyroid nonpalpable.  Skin: Warm and dry, no rashes. Cardiac: Regular rate and rhythm, no murmurs rubs or gallops, no lower extremity edema.  Respiratory: Clear to auscultation bilaterally. Not using accessory muscles, speaking in full sentences.  Procedure: Real-time Ultrasound Guided Injection of left knee Device: GE Logiq E  Verbal informed consent obtained.  Time-out conducted.  Noted no overlying erythema, induration, or other signs of local infection.  Skin prepped in a sterile fashion.  Local anesthesia: Topical Ethyl chloride.  With sterile technique and under real time ultrasound guidance:   30 mg/2 mL of OrthoVisc (sodium hyaluronate) in a prefilled syringe was injected easily into the knee through a 22-gauge needle. Completed without difficulty  Pain immediately resolved suggesting accurate placement of the medication.  Advised to call if fevers/chills, erythema, induration, drainage, or persistent bleeding.  Images permanently stored and available for review in the ultrasound unit.  Impression: Technically successful ultrasound guided injection.  Impression and  Recommendations:

## 2014-01-20 NOTE — Telephone Encounter (Signed)
Left detailed message.   

## 2014-01-27 ENCOUNTER — Encounter: Payer: Self-pay | Admitting: Sports Medicine

## 2014-01-27 ENCOUNTER — Ambulatory Visit (INDEPENDENT_AMBULATORY_CARE_PROVIDER_SITE_OTHER): Payer: Self-pay | Admitting: Sports Medicine

## 2014-01-27 VITALS — BP 117/68 | HR 95 | Ht 66.0 in | Wt 242.0 lb

## 2014-01-27 DIAGNOSIS — M2240 Chondromalacia patellae, unspecified knee: Secondary | ICD-10-CM

## 2014-01-27 DIAGNOSIS — F3289 Other specified depressive episodes: Secondary | ICD-10-CM

## 2014-01-27 DIAGNOSIS — F329 Major depressive disorder, single episode, unspecified: Secondary | ICD-10-CM

## 2014-01-27 NOTE — Assessment & Plan Note (Signed)
No longer crying all the time with nortriptyline, it has only been a week. Continue both medications, follow this up with PCP/.

## 2014-01-27 NOTE — Progress Notes (Signed)
  Subjective:    CC: Followup  HPI: Left knee osteoarthritis: 70% improved, OrthoVisc injection #4 today.  Depression/anniversary syndrome: Not yet feeling any response to the nortriptyline, continues to take Viibryd.  Past medical history, Surgical history, Family history not pertinant except as noted below, Social history, Allergies, and medications have been entered into the medical record, reviewed, and no changes needed.   Review of Systems: No fevers, chills, night sweats, weight loss, chest pain, or shortness of breath.   Objective:    General: Well Developed, well nourished, and in no acute distress.  Neuro: Alert and oriented x3, extra-ocular muscles intact, sensation grossly intact.  HEENT: Normocephalic, atraumatic, pupils equal round reactive to light, neck supple, no masses, no lymphadenopathy, thyroid nonpalpable.  Skin: Warm and dry, no rashes. Cardiac: Regular rate and rhythm, no murmurs rubs or gallops, no lower extremity edema.  Respiratory: Clear to auscultation bilaterally. Not using accessory muscles, speaking in full sentences.  Procedure: Real-time Ultrasound Guided Injection of left knee Device: GE Logiq E  Verbal informed consent obtained.  Time-out conducted.  Noted no overlying erythema, induration, or other signs of local infection.  Skin prepped in a sterile fashion.  Local anesthesia: Topical Ethyl chloride.  With sterile technique and under real time ultrasound guidance:   30 mg/2 mL of OrthoVisc (sodium hyaluronate) in a prefilled syringe was injected easily into the knee through a 22-gauge needle. Completed without difficulty  Pain immediately resolved suggesting accurate placement of the medication.  Advised to call if fevers/chills, erythema, induration, drainage, or persistent bleeding.  Images permanently stored and available for review in the ultrasound unit.  Impression: Technically successful ultrasound guided injection.  Impression and  Recommendations:

## 2014-01-27 NOTE — Assessment & Plan Note (Signed)
Improved significantly, OrthoVisc injection #4 into the left knee today. Return in one month.

## 2014-02-03 ENCOUNTER — Other Ambulatory Visit: Payer: Self-pay | Admitting: *Deleted

## 2014-02-03 MED ORDER — CYCLOBENZAPRINE HCL 10 MG PO TABS: ORAL_TABLET | ORAL | Status: DC

## 2014-02-03 MED ORDER — HYDROCODONE-ACETAMINOPHEN 7.5-325 MG PO TABS: ORAL_TABLET | ORAL | Status: DC

## 2014-02-03 MED ORDER — TOPIRAMATE 100 MG PO TABS: 100.0000 mg | ORAL_TABLET | Freq: Two times a day (BID) | ORAL | Status: DC

## 2014-02-03 MED ORDER — HYDROCHLOROTHIAZIDE 25 MG PO TABS: ORAL_TABLET | ORAL | Status: DC

## 2014-02-03 MED ORDER — LOSARTAN POTASSIUM 50 MG PO TABS: ORAL_TABLET | ORAL | Status: DC

## 2014-02-03 MED ORDER — DIAZEPAM 10 MG PO TABS: 5.0000 mg | ORAL_TABLET | Freq: Two times a day (BID) | ORAL | Status: DC | PRN

## 2014-02-03 NOTE — Telephone Encounter (Signed)
Pt to come by and p/u tomorrow.Ashley Gomez

## 2014-02-10 ENCOUNTER — Encounter: Payer: Self-pay | Admitting: Family Medicine

## 2014-02-10 ENCOUNTER — Ambulatory Visit (INDEPENDENT_AMBULATORY_CARE_PROVIDER_SITE_OTHER): Payer: Self-pay | Admitting: Family Medicine

## 2014-02-10 VITALS — BP 116/68 | HR 86 | Temp 98.0°F | Ht 66.0 in | Wt 248.0 lb

## 2014-02-10 DIAGNOSIS — R21 Rash and other nonspecific skin eruption: Secondary | ICD-10-CM

## 2014-02-10 DIAGNOSIS — F4321 Adjustment disorder with depressed mood: Secondary | ICD-10-CM

## 2014-02-10 DIAGNOSIS — F329 Major depressive disorder, single episode, unspecified: Secondary | ICD-10-CM

## 2014-02-10 DIAGNOSIS — F3289 Other specified depressive episodes: Secondary | ICD-10-CM

## 2014-02-10 MED ORDER — PERMETHRIN 5 % EX CREA: 1.0000 "application " | TOPICAL_CREAM | Freq: Once | CUTANEOUS | Status: DC

## 2014-02-10 MED ORDER — CLOBETASOL PROPIONATE 0.05 % EX CREA: 1.0000 "application " | TOPICAL_CREAM | Freq: Two times a day (BID) | CUTANEOUS | Status: DC

## 2014-02-10 NOTE — Progress Notes (Signed)
   Subjective:    Patient ID: Ashley Gomez, female    DOB: 1970/12/10, 43 y.o.   MRN: [id number]  HPI Rash - Says still has her rash on her abdomen, arms and shoulders.  Says the topical steroid cream. She says it's extremely itchy and she continues to scratch at it in the room. Says she has completely switched her soap, detergents etc. to fragrance free diet for Rx and still has not noticed a difference. There are 6 other people that live in her home and none of them have a similar rash. Again children often sleep in the bed with her and they have not seen any type of rash.  Acute grief reaction-she can start a therapy group: [facility]. Dr. Dianah Field also added nortriptyline at bedtime to help with her mood and with sleep. She's been taking 50 mg daily and hasn't noticed much difference.   Review of Systems     Objective:   Physical Exam  Constitutional: She appears well-developed and well-nourished.  HENT:  Head: Normocephalic and atraumatic.  Skin: Skin is warm and dry. Rash noted.  Fine erythematous papular rash on the abdomen, upper arms , posterior shoulders. She has some dry peeling skin along her wrists bilaterally no lesions between the fingers. Most of the lesions are excoriated.  Psychiatric: Her behavior is normal.          Assessment & Plan:  Rash-at this think it may be worthwhile trying to treat for scabies. She can repeat the treatment in 2 weeks if needed. She does get some mild relief with topical steroid cream so we will switch this to a stronger strength steroid, clobetasol and give her a little bit larger quantity. Call if not resolving. Discussed importance of not continuing to scratch. Most of the lesions are excoriated so it is really difficult to tell what the lesions actually look like. As he continues to scratch, itch cycle.  Acute grief reaction-a bladder she's in counseling. She can certainly try increasing her nortriptyline to 2 tabs which would be 100  mg and see if this is helpful. Continue to hold trazodone.

## 2014-02-11 ENCOUNTER — Telehealth: Payer: Self-pay | Admitting: *Deleted

## 2014-02-11 NOTE — Telephone Encounter (Signed)
Spoke w/Fran asked if these creams could be switched to an ointment she stated that the ointment is more expensive. The other alternative would be Stromectol an oral med which could be used off label for tx and she would need 18mg  they don't have this in stock however, they can order and have this in by tomorrow the cost would be $32.50.  I called the pt and informed her of this and she is fine with this price point as long as it doesn't interfere w/her other meds.Ashley Gomez

## 2014-02-11 NOTE — Telephone Encounter (Signed)
Pt called and stated that the medication that Dr. Madilyn Fireman sent in for her skin yesterday is too expensive and was told to call back if this was the case. Please advise.Ashley Gomez [place]

## 2014-02-11 NOTE — Telephone Encounter (Signed)
Was it the clobetasol or the permethrin?  Let's call the pharmacy and see if we can switch from cream to ointment etc. and see if that brings the cost down.

## 2014-02-12 NOTE — Telephone Encounter (Signed)
Mason for the stromectol ( ivermectin)  18mg  PO x 1.  Can repeat in 2 weeks.  #2 tabs.  No RF. Please call in

## 2014-02-12 NOTE — Telephone Encounter (Signed)
Called and informed pt rx called into pharmacy.Audelia Hives [place] Spoke w/fran.Audelia Hives [place]

## 2014-03-06 ENCOUNTER — Telehealth: Payer: Self-pay | Admitting: Family Medicine

## 2014-03-06 ENCOUNTER — Encounter: Payer: Self-pay | Admitting: Sports Medicine

## 2014-03-06 ENCOUNTER — Encounter: Payer: Self-pay | Admitting: Family Medicine

## 2014-03-06 ENCOUNTER — Ambulatory Visit (INDEPENDENT_AMBULATORY_CARE_PROVIDER_SITE_OTHER): Payer: Self-pay | Admitting: Family Medicine

## 2014-03-06 ENCOUNTER — Ambulatory Visit (INDEPENDENT_AMBULATORY_CARE_PROVIDER_SITE_OTHER): Payer: Self-pay | Admitting: Sports Medicine

## 2014-03-06 ENCOUNTER — Other Ambulatory Visit: Payer: Self-pay | Admitting: *Deleted

## 2014-03-06 VITALS — BP 138/77 | HR 94 | Ht 66.0 in | Wt 242.0 lb

## 2014-03-06 DIAGNOSIS — F329 Major depressive disorder, single episode, unspecified: Secondary | ICD-10-CM

## 2014-03-06 DIAGNOSIS — F32A Depression, unspecified: Secondary | ICD-10-CM

## 2014-03-06 DIAGNOSIS — M2240 Chondromalacia patellae, unspecified knee: Secondary | ICD-10-CM

## 2014-03-06 DIAGNOSIS — Z23 Encounter for immunization: Secondary | ICD-10-CM

## 2014-03-06 DIAGNOSIS — J0110 Acute frontal sinusitis, unspecified: Secondary | ICD-10-CM

## 2014-03-06 DIAGNOSIS — R21 Rash and other nonspecific skin eruption: Secondary | ICD-10-CM

## 2014-03-06 DIAGNOSIS — F331 Major depressive disorder, recurrent, moderate: Secondary | ICD-10-CM

## 2014-03-06 MED ORDER — LOSARTAN POTASSIUM 50 MG PO TABS: ORAL_TABLET | ORAL | Status: DC

## 2014-03-06 MED ORDER — NORTRIPTYLINE HCL 50 MG PO CAPS: 50.0000 mg | ORAL_CAPSULE | Freq: Every day | ORAL | Status: DC

## 2014-03-06 MED ORDER — HYDROCODONE-ACETAMINOPHEN 7.5-325 MG PO TABS: ORAL_TABLET | ORAL | Status: DC

## 2014-03-06 MED ORDER — METHYLPREDNISOLONE ACETATE 80 MG/ML IJ SUSP
80.0000 mg | Freq: Once | INTRAMUSCULAR | Status: AC
  Administered 2014-03-06: 80 mg via INTRAMUSCULAR

## 2014-03-06 MED ORDER — TOPIRAMATE 100 MG PO TABS: 100.0000 mg | ORAL_TABLET | Freq: Two times a day (BID) | ORAL | Status: DC

## 2014-03-06 MED ORDER — CYCLOBENZAPRINE HCL 10 MG PO TABS: ORAL_TABLET | ORAL | Status: DC

## 2014-03-06 MED ORDER — DIAZEPAM 10 MG PO TABS: 5.0000 mg | ORAL_TABLET | Freq: Two times a day (BID) | ORAL | Status: DC | PRN

## 2014-03-06 MED ORDER — LIOTHYRONINE SODIUM 5 MCG PO TABS: 5.0000 ug | ORAL_TABLET | ORAL | Status: DC

## 2014-03-06 MED ORDER — HYDROCHLOROTHIAZIDE 25 MG PO TABS: ORAL_TABLET | ORAL | Status: DC

## 2014-03-06 NOTE — Assessment & Plan Note (Signed)
Slightly improved but persistent pain despite Visco supplementation. She is now approved for charity care with [place] [facility]%. We're going to now put her into formal physical therapy.  Return in 6 weeks, repeat injection if no better.

## 2014-03-06 NOTE — Telephone Encounter (Signed)
Decrease dose of notritypiline to 1 tab for one week and then one every other day for one week and then stop

## 2014-03-06 NOTE — Addendum Note (Signed)
Addended by: Beatrice Lecher D on: 03/06/2014 05:17 PM   Modules accepted: Orders

## 2014-03-06 NOTE — Progress Notes (Signed)
  Subjective:    CC: Followup  HPI: Left knee patellofemoral chondromalacia: Symptoms are approximately the same as they were after her fourth OrthoVisc injection. Moderate, persistent, she now has charity care and is able to do physical therapy. Symptoms are mild, persistent.  Past medical history, Surgical history, Family history not pertinant except as noted below, Social history, Allergies, and medications have been entered into the medical record, reviewed, and no changes needed.   Review of Systems: No fevers, chills, night sweats, weight loss, chest pain, or shortness of breath.   Objective:    General: Well Developed, well nourished, and in no acute distress.  Neuro: Alert and oriented x3, extra-ocular muscles intact, sensation grossly intact.  HEENT: Normocephalic, atraumatic, pupils equal round reactive to light, neck supple, no masses, no lymphadenopathy, thyroid nonpalpable.  Skin: Warm and dry, no rashes. Cardiac: Regular rate and rhythm, no murmurs rubs or gallops, no lower extremity edema.  Respiratory: Clear to auscultation bilaterally. Not using accessory muscles, speaking in full sentences. Left Knee: Normal to inspection with no erythema or effusion or obvious bony abnormalities. Palpation normal with no warmth or joint line tenderness or patellar tenderness or condyle tenderness. ROM normal in flexion and extension and lower leg rotation. Ligaments with solid consistent endpoints including ACL, PCL, LCL, MCL. Negative Mcmurray's and provocative meniscal tests. Non painful patellar compression. Patellar and quadriceps tendons unremarkable. Hamstring and quadriceps strength is normal.  Impression and Recommendations:

## 2014-03-06 NOTE — Progress Notes (Addendum)
Subjective:    Patient ID: Ashley Gomez, female    DOB: Nov 22, 1970, 43 y.o.   MRN: [id number]  Rash   Depression/Greiving - she has been going to group sessions but will be cancel for the next 2 weeks. She says her daugher feels like her Ileene Patrick is no longer working well. She feels like it is doing ok. We increased her notriptyline to 100mg  at [facility]. Really hasn't noticed a difference.   Rash- she continue to scratch and itchy.  Says the itching got so bad so even went to the ED for it. She has already been using benadryl.  She has not improved with tx for scabies.  She did tx about 2 weeks ago.  Looking back in my notes this rash started in May and has waxed and waned. It was before she started Viibryd.   She has had  Nasal congestion and cough since then about 2-3 weeks.  Did have fever initially.  Cough is productive.  No SOB.  No worsening or alleivating factors.    Review of Systems  Skin: Positive for rash.       Objective:   Physical Exam  Constitutional: She is oriented to person, place, and time. She appears well-developed and well-nourished.  HENT:  Head: Normocephalic and atraumatic.  Right Ear: External ear normal.  Left Ear: External ear normal.  Nose: Nose normal.  Mouth/Throat: Oropharynx is clear and moist.  TMs and canals are clear.   Eyes: Conjunctivae and EOM are normal. Pupils are equal, round, and reactive to light.  Neck: Neck supple. No thyromegaly present.  Cardiovascular: Normal rate, regular rhythm and normal heart sounds.   Pulmonary/Chest: Effort normal and breath sounds normal. She has no wheezes.  Lymphadenopathy:    She has no cervical adenopathy.  Neurological: She is alert and oriented to person, place, and time.  Skin: Skin is warm and dry.  Psychiatric: She has a normal mood and affect.          Assessment & Plan:  Acute sinusitis - will tx with augmentin. Call if not better in one week. Ok for symptomatic care.    Rash - Now has  had for 5 months and really hasn't responded well to any treatments. Will check Hgb level since feels like the itching is worse after a hot shower. Will given depo-medrol 150mg  injection for acute relief of itching, since benadryl not working.    Depression - Continue current regimen.  We had tried her on several different regimens and this has worked the best. Continue grief therapy which I think has been very helpful for her. We could consider adding a low dose of Cytomel in the morning to try help his mood.  Shave Biopsy Procedure Note  Pre-operative Diagnosis: Suspicious lesion  Post-operative Diagnosis: same  Locations:right upper chest   Indications: itchey rash  Anesthesia: Lidocaine 1% with epinephrine    Procedure Details  Patient informed of the risks (including bleeding and infection) and benefits of the  procedure and Verbal informed consent obtained.  The lesion and surrounding area were given a sterile prep using chlorhexidine and draped in the usual sterile fashion. A scalpel was used to shave an area of skin approximately 29mm by 35mm.  Hemostasis achieved with alumuninum chloride. Antibiotic ointment and a sterile dressing applied.  The specimen was sent for pathologic examination. The patient tolerated the procedure well.  EBL: 0 ml  Findings: awaith pathology  Condition: Stable  Complications: none.  Plan:  1. Instructed to keep the wound dry and covered for 24-48h and clean thereafter. 2. Warning signs of infection were reviewed.   3. Recommended that the patient use OTC acetaminophen as needed for pain.  4. Return PRN

## 2014-03-11 NOTE — Telephone Encounter (Signed)
Patient states she will try one more month of the medication.

## 2014-03-12 ENCOUNTER — Other Ambulatory Visit: Payer: Self-pay | Admitting: Family Medicine

## 2014-03-12 MED ORDER — CEPHALEXIN 500 MG PO CAPS: 500.0000 mg | ORAL_CAPSULE | Freq: Three times a day (TID) | ORAL | Status: DC

## 2014-03-19 ENCOUNTER — Ambulatory Visit: Payer: Self-pay | Admitting: Physical Therapy

## 2014-04-03 ENCOUNTER — Telehealth: Payer: Self-pay | Admitting: *Deleted

## 2014-04-03 DIAGNOSIS — F329 Major depressive disorder, single episode, unspecified: Secondary | ICD-10-CM

## 2014-04-03 DIAGNOSIS — F32A Depression, unspecified: Secondary | ICD-10-CM

## 2014-04-03 MED ORDER — CYCLOBENZAPRINE HCL 10 MG PO TABS: ORAL_TABLET | ORAL | Status: DC

## 2014-04-03 MED ORDER — DIAZEPAM 10 MG PO TABS: 5.0000 mg | ORAL_TABLET | Freq: Two times a day (BID) | ORAL | Status: DC | PRN

## 2014-04-03 MED ORDER — NORTRIPTYLINE HCL 50 MG PO CAPS: 100.0000 mg | ORAL_CAPSULE | Freq: Every day | ORAL | Status: DC

## 2014-04-03 MED ORDER — HYDROCODONE-ACETAMINOPHEN 7.5-325 MG PO TABS: ORAL_TABLET | ORAL | Status: DC

## 2014-04-03 NOTE — Telephone Encounter (Signed)
Patient called for refills.

## 2014-04-14 NOTE — Telephone Encounter (Signed)
Med refills.Audelia Hives Panora

## 2014-04-17 ENCOUNTER — Ambulatory Visit: Payer: Self-pay | Admitting: Sports Medicine

## 2014-04-20 ENCOUNTER — Telehealth: Payer: Self-pay | Admitting: Sports Medicine

## 2014-04-20 DIAGNOSIS — M2240 Chondromalacia patellae, unspecified knee: Secondary | ICD-10-CM

## 2014-04-20 NOTE — Telephone Encounter (Signed)
Dr. Darene Lamer please see noted below. Jahiem Franzoni,CMA

## 2014-04-20 NOTE — Telephone Encounter (Signed)
Done

## 2014-04-20 NOTE — Telephone Encounter (Signed)
Patient was told by teresa here in PT that she needs to be referred to [facility] due to her outstanidng Medicaid...  Can you please put in ref to that location so they can get her set up there?  thanks

## 2014-05-04 ENCOUNTER — Telehealth: Payer: Self-pay | Admitting: *Deleted

## 2014-05-04 ENCOUNTER — Ambulatory Visit: Payer: Self-pay | Admitting: Sports Medicine

## 2014-05-04 MED ORDER — LOSARTAN POTASSIUM 50 MG PO TABS: ORAL_TABLET | ORAL | Status: DC

## 2014-05-04 MED ORDER — LIOTHYRONINE SODIUM 5 MCG PO TABS: 5.0000 ug | ORAL_TABLET | ORAL | Status: DC

## 2014-05-04 MED ORDER — TOPIRAMATE 100 MG PO TABS: 100.0000 mg | ORAL_TABLET | Freq: Two times a day (BID) | ORAL | Status: DC

## 2014-05-04 MED ORDER — SUMATRIPTAN SUCCINATE 100 MG PO TABS: 100.0000 mg | ORAL_TABLET | ORAL | Status: DC | PRN

## 2014-05-04 MED ORDER — HYDROCODONE-ACETAMINOPHEN 7.5-325 MG PO TABS: ORAL_TABLET | ORAL | Status: DC

## 2014-05-04 MED ORDER — GABAPENTIN 300 MG PO CAPS: 300.0000 mg | ORAL_CAPSULE | Freq: Every day | ORAL | Status: DC

## 2014-05-04 MED ORDER — DIAZEPAM 10 MG PO TABS: 5.0000 mg | ORAL_TABLET | Freq: Two times a day (BID) | ORAL | Status: DC | PRN

## 2014-05-04 NOTE — Telephone Encounter (Signed)
Printed and sent refills. Patient will pick up after 1 pm today.

## 2014-05-18 ENCOUNTER — Telehealth: Payer: Self-pay

## 2014-05-18 NOTE — Telephone Encounter (Signed)
Ashley Gomez called for refills of Viibryd. She is in a patient program with [facility] for her Viibryd. I called [employer]/Patient assistance Program at [phone] and ordered a refill. The Viibryd will have to be ordered once every 90 days, they do not send until requested by the prescribing office. Order # [id number]. The Viibryd will be sent out today but my take up to 14 days.   I called in Viibryd 40 mg once daily # 30, 0 refills to [facility]. I gave them the Viibryd trial offer information so patient could receive a 30 day supply for free.

## 2014-06-04 ENCOUNTER — Encounter: Payer: Self-pay | Admitting: Family Medicine

## 2014-06-04 ENCOUNTER — Ambulatory Visit (INDEPENDENT_AMBULATORY_CARE_PROVIDER_SITE_OTHER): Payer: Self-pay | Admitting: Family Medicine

## 2014-06-04 VITALS — BP 117/70 | HR 76 | Ht 66.0 in | Wt 239.0 lb

## 2014-06-04 DIAGNOSIS — R197 Diarrhea, unspecified: Secondary | ICD-10-CM

## 2014-06-04 DIAGNOSIS — F419 Anxiety disorder, unspecified: Secondary | ICD-10-CM

## 2014-06-04 DIAGNOSIS — F418 Other specified anxiety disorders: Secondary | ICD-10-CM

## 2014-06-04 DIAGNOSIS — G4700 Insomnia, unspecified: Secondary | ICD-10-CM

## 2014-06-04 DIAGNOSIS — F329 Major depressive disorder, single episode, unspecified: Secondary | ICD-10-CM

## 2014-06-04 DIAGNOSIS — R1012 Left upper quadrant pain: Secondary | ICD-10-CM

## 2014-06-04 MED ORDER — LIOTHYRONINE SODIUM 5 MCG PO TABS: 5.0000 ug | ORAL_TABLET | ORAL | Status: DC

## 2014-06-04 MED ORDER — CYCLOBENZAPRINE HCL 10 MG PO TABS: ORAL_TABLET | ORAL | Status: DC

## 2014-06-04 MED ORDER — HYDROCODONE-ACETAMINOPHEN 7.5-325 MG PO TABS: ORAL_TABLET | ORAL | Status: DC

## 2014-06-04 MED ORDER — LOSARTAN POTASSIUM 50 MG PO TABS: ORAL_TABLET | ORAL | Status: DC

## 2014-06-04 MED ORDER — DIAZEPAM 10 MG PO TABS: 5.0000 mg | ORAL_TABLET | Freq: Two times a day (BID) | ORAL | Status: DC | PRN

## 2014-06-04 MED ORDER — HYDROCHLOROTHIAZIDE 25 MG PO TABS: ORAL_TABLET | ORAL | Status: DC

## 2014-06-04 NOTE — Progress Notes (Signed)
Subjective:    Patient ID: Ashley Gomez, female    DOB: October 29, 1970, 44 y.o.   MRN: [id number]  HPI Depression /.Anxiety - She has not been doing well.   Has been having daily chronic headaches.  Says feels like she has gone backwards for her mood. Seh feels like her grief levels have been really high around teh HOlidays.   Has applied for charity care for seeing therapist here, but hasn't heard back from financial dept yet. Says has called multiple times.  Says finanacially she is really struggling.  She has been very stressed. Grandkids who live with her  have been sick.  Did get a puppy over the Holidays.  Says her diarrhea has been much worse over the last few weeks.  She has had dec appetite.  No blood i the stool.   Has been having some epigastric pain.  She has hx of ulcer. She feels like it has been flaring.   Her granddaughter is here with her today.    Review of Systems  BP 117/70 mmHg  Pulse 76  Ht 5\' 6"  (1.676 m)  Wt 239 lb (108.41 kg)  BMI 38.59 kg/m2  SpO2 99%    Allergies  Allergen Reactions  . Ace Inhibitors     REACTION: cough  . Aspirin Other (See Comments)    Stomach Ulcer  . Atenolol Other (See Comments)    Bradycardia  . Celexa [Citalopram Hydrobromide] Other (See Comments)    Dizziness   . Codeine Nausea Only  . Phenytoin   . Sertraline Other (See Comments)  . Triamcinolone     Steroid flare after joint injection, use other steroid for injections    Past Medical History  Diagnosis Date  . Hypertension   . Depression   . Anxiety   . Seizures   . MS (multiple sclerosis)   . Obesity   . Smoking   . Migraines   . Facet hypertrophy of lumbar region     MRI 2007  . DVT (deep venous thrombosis) 12/26/4816    L basilic vein   . CVA (cerebral infarction)     Old CVA on MRI brain    Past Surgical History  Procedure Laterality Date  . Tubal ligation  1992  . Tonsillectomy      History   Social History  . Marital Status: Single    Spouse  Name: N/A    Number of Children: N/A  . Years of Education: N/A   Occupational History  . Not on file.   Social History Main Topics  . Smoking status: Current Every Day Smoker -- 1.00 packs/day for 25 years    Types: Cigarettes  . Smokeless tobacco: Not on file  . Alcohol Use: Yes     Comment: occasional  . Drug Use: No  . Sexual Activity: No   Other Topics Concern  . Not on file   Social History Narrative    Family History  Problem Relation Age of Onset  . Cancer Mother 63    melanoma  . Hypertension Sister   . Heart disease Sister     valve disease  . Depression Sister   . Diabetes Sister   . Sjogren's syndrome Sister     Outpatient Encounter Prescriptions as of 06/04/2014  Medication Sig  . cyclobenzaprine (FLEXERIL) 10 MG tablet TAKE ONE TABLET BY MOUTH NIGHTLY AT BEDTIME AS NEEDED FOR MUSCLE SPASMS  . diazepam (VALIUM) 10 MG tablet Take 0.5-1 tablets (5-10 mg total)  by mouth every 12 (twelve) hours as needed for anxiety.  . diphenoxylate-atropine (LOMOTIL) 2.5-0.025 MG per tablet Take 1 tablet by mouth 4 (four) times daily as needed for diarrhea or loose stools.  . gabapentin (NEURONTIN) 300 MG capsule Take 1 capsule (300 mg total) by mouth at bedtime.  . hydrochlorothiazide (HYDRODIURIL) 25 MG tablet TAKE ONE TABLET BY MOUTH DAILY AS NEEDED  . HYDROcodone-acetaminophen (NORCO) 7.5-325 MG per tablet TAKE ONE TABLET BY MOUTH EVERY EIGHT HOURS AS NEEDED FOR PAIN  . liothyronine (CYTOMEL) 5 MCG tablet Take 1 tablet (5 mcg total) by mouth every morning.  Marland Kitchen losartan (COZAAR) 50 MG tablet TAKE ONE TABLET BY MOUTH ONE TIME DAILY  . promethazine (PHENERGAN) 50 MG tablet Take every 6 hours as needed for nausea or migraine.  . SUMAtriptan (IMITREX) 100 MG tablet Take 1 tablet (100 mg total) by mouth every 2 (two) hours as needed for migraine. No more than 2 in 24 hours.  . topiramate (TOPAMAX) 100 MG tablet Take 1 tablet (100 mg total) by mouth 2 (two) times daily.  .  traZODone (DESYREL) 50 MG tablet Take 50 mg by mouth at bedtime as needed for sleep.  . Vilazodone HCl (VIIBRYD) 40 MG TABS Take 1 tablet (40 mg total) by mouth daily.  . [DISCONTINUED] cyclobenzaprine (FLEXERIL) 10 MG tablet TAKE ONE TABLET BY MOUTH NIGHTLY AT BEDTIME AS NEEDED FOR MUSCLE SPASMS  . [DISCONTINUED] diazepam (VALIUM) 10 MG tablet Take 0.5-1 tablets (5-10 mg total) by mouth every 12 (twelve) hours as needed for anxiety.  . [DISCONTINUED] hydrochlorothiazide (HYDRODIURIL) 25 MG tablet TAKE ONE TABLET BY MOUTH DAILY AS NEEDED  . [DISCONTINUED] HYDROcodone-acetaminophen ([place]) 7.5-325 MG per tablet TAKE ONE TABLET BY MOUTH EVERY EIGHT HOURS AS NEEDED FOR PAIN  . [DISCONTINUED] liothyronine (CYTOMEL) 5 MCG tablet Take 1 tablet (5 mcg total) by mouth every morning.  . [DISCONTINUED] losartan (COZAAR) 50 MG tablet TAKE ONE TABLET BY MOUTH ONE TIME DAILY  . nortriptyline (PAMELOR) 50 MG capsule Take 2 capsules (100 mg total) by mouth at bedtime. (Patient not taking: Reported on 06/04/2014)  . [DISCONTINUED] AMBULATORY NON FORMULARY MEDICATION Knee-high, medium compression, graduated compression stockings. Apply to lower extremities.  . [DISCONTINUED] cephALEXin (KEFLEX) 500 MG capsule Take 1 capsule (500 mg total) by mouth 3 (three) times daily.           Objective:   Physical Exam  Constitutional: She is oriented to person, place, and time. She appears well-developed and well-nourished.  HENT:  Head: Normocephalic and atraumatic.  Cardiovascular: Normal rate, regular rhythm and normal heart sounds.   Pulmonary/Chest: Effort normal and breath sounds normal.  Abdominal: Soft. Bowel sounds are normal. She exhibits no distension and no mass. There is tenderness. There is no rebound and no guarding.  mildly tender in the LUQ, and the epigastrum.   Neurological: She is alert and oriented to person, place, and time.  Skin: Skin is warm and dry.  Psychiatric: She has a normal mood and  affect. Her behavior is normal.          Assessment & Plan:  Anxiety/depression-PHQ 9 score of 25 and Gad 7 score of 20 today. She has had thoughts of being better off dead but denies wanting to harm herself. I really want to ger her back in therapy and she wants to as well. She says it was really helpful. Ok to stop cytomel since she feels it really hasn't helped her with motivation or energy.    Left  upper quadrant pain-recurrent ulcer vs gastritis vs pancreatitis. No fever or vomiting. She doesn't have insurance and doesn't want to do any testing. Recommend start a PPI.  Needs to eat more than once a day. Try to get some food on the stomach, esp with her medication.   Diarrhea - I think worse bc of high anxiety.  Ok to use Lomotil  Insomnia- hasn't been sleeping well for the last 2 moths.  OK to restart the trazodone.

## 2014-06-05 ENCOUNTER — Ambulatory Visit: Payer: Self-pay | Admitting: Family Medicine

## 2014-06-15 ENCOUNTER — Ambulatory Visit: Payer: Self-pay

## 2014-06-15 ENCOUNTER — Encounter: Payer: Self-pay | Admitting: Sports Medicine

## 2014-06-15 ENCOUNTER — Ambulatory Visit (INDEPENDENT_AMBULATORY_CARE_PROVIDER_SITE_OTHER): Payer: Self-pay | Admitting: Sports Medicine

## 2014-06-15 DIAGNOSIS — I8392 Asymptomatic varicose veins of left lower extremity: Secondary | ICD-10-CM

## 2014-06-15 NOTE — Assessment & Plan Note (Addendum)
Having recurrence of pain in the posterior and medial left leg/thigh, approximately 4 inches proximal to the knee joint. There is a palpable nodule here that feels like either a superficial venous thrombosis or varicose vein. She does have a history of blood clots, so we are going to obtain lower extremity Dopplers with close attention paid to the location of question.  Ultrasound confirms the area of palpable concern is a superficial varicosity, she needs to do bilateral compression hose all day.

## 2014-06-15 NOTE — Progress Notes (Signed)
  Subjective:    CC: Left knee pain  HPI: Ashley Gomez returns, she has knee osteoarthritis, we have been doing occasional injections, unfortunately she was unable to afford physical therapy, both here or at [facility]. Her complaint today is different, and is predominantly in the posterior medial thigh, approximately 4 inches proximal to the knee joint, she has a palpable and painful nodule is worried that she has a blood clot. Symptoms are moderate, persistent.  Past medical history, Surgical history, Family history not pertinant except as noted below, Social history, Allergies, and medications have been entered into the medical record, reviewed, and no changes needed.   Review of Systems: No fevers, chills, night sweats, weight loss, chest pain, or shortness of breath.   Objective:    General: Well Developed, well nourished, and in no acute distress.  Neuro: Alert and oriented x3, extra-ocular muscles intact, sensation grossly intact.  HEENT: Normocephalic, atraumatic, pupils equal round reactive to light, neck supple, no masses, no lymphadenopathy, thyroid nonpalpable.  Skin: Warm and dry, no rashes. Cardiac: Regular rate and rhythm, no murmurs rubs or gallops, no lower extremity edema.  Respiratory: Clear to auscultation bilaterally. Not using accessory muscles, speaking in full sentences. Left leg: There is a palpable nodule approximately 4 inches proximal to the knee joint, and the posterior medial thigh, it feels like a varicose vein. Negative Homans sign, neurovascularly intact distally, good motion, good strength.  Impression and Recommendations:

## 2014-06-17 ENCOUNTER — Telehealth: Payer: Self-pay | Admitting: Family Medicine

## 2014-06-17 DIAGNOSIS — F331 Major depressive disorder, recurrent, moderate: Secondary | ICD-10-CM

## 2014-06-17 DIAGNOSIS — F411 Generalized anxiety disorder: Secondary | ICD-10-CM

## 2014-06-17 NOTE — Telephone Encounter (Signed)
Please call patient see if she has checked into going to [facility]. I know she doesn't currently have insurance but there are a few places around that will offer some psychiatry services and counseling services for this without insurance. She may want to contact them if she has not already.

## 2014-06-22 ENCOUNTER — Encounter: Payer: Self-pay | Admitting: Physician Assistant

## 2014-06-22 ENCOUNTER — Telehealth: Payer: Self-pay

## 2014-06-22 ENCOUNTER — Ambulatory Visit (INDEPENDENT_AMBULATORY_CARE_PROVIDER_SITE_OTHER): Payer: Self-pay | Admitting: Physician Assistant

## 2014-06-22 VITALS — BP 127/61 | HR 77 | Temp 97.8°F | Ht 66.0 in | Wt 240.0 lb

## 2014-06-22 DIAGNOSIS — J0110 Acute frontal sinusitis, unspecified: Secondary | ICD-10-CM

## 2014-06-22 DIAGNOSIS — H9201 Otalgia, right ear: Secondary | ICD-10-CM

## 2014-06-22 MED ORDER — KETOROLAC TROMETHAMINE 30 MG/ML IJ SOLN
30.0000 mg | Freq: Once | INTRAMUSCULAR | Status: AC
  Administered 2014-06-22: 30 mg via INTRAMUSCULAR

## 2014-06-22 MED ORDER — PREDNISONE 20 MG PO TABS: ORAL_TABLET | ORAL | Status: DC

## 2014-06-22 NOTE — Telephone Encounter (Signed)
Patient scheduled for an office visit.  

## 2014-06-22 NOTE — Progress Notes (Signed)
   Subjective:    Patient ID: Ashley Gomez, female    DOB: 1971/04/07, 44 y.o.   MRN: [id number]  HPI Patient is a 44 year old female who presents to the clinic with unresolved right sided ear pain and frontal sinus pressure and congestion. Symptoms have been present for over 3 weeks at this point. She did go to [facility] and was diagnosed with sinusitis. She was given Bactrim for 10 days. She does have 3 tabs left of Bactrim. She does feel like her sore throat and cough have improved. She still feels very stuffy. She also has some right mid to low back pain. She denies any painful urination, increasing urination or UTI symptoms. She has been coughing a lot and suspects there could be some strained muscles. Norco does not seem to be helping the discomfort in her right back. She did try Flonase for her ears and seems to dry her out too much. She denies any fever, chills, nausea or vomiting.  Review of Systems  All other systems reviewed and are negative.      Objective:   Physical Exam  Constitutional: She is oriented to person, place, and time. She appears well-developed and well-nourished.  HENT:  Head: Normocephalic and atraumatic.  Left Ear: External ear normal.  Nose: Nose normal.  Mouth/Throat: Oropharynx is clear and moist. No oropharyngeal exudate.  Right TM no erythema but does appear to be bulging. Light reflex visible but ossicles are not seen. There is some erythema in the canal.  Left TM looks great.  There is some tenderness to palpation around the frontal sinuses.  Eyes: Conjunctivae are normal.  Neck: Normal range of motion. Neck supple.  Cardiovascular: Normal rate, regular rhythm and normal heart sounds.   Pulmonary/Chest: Effort normal and breath sounds normal. She has no wheezes.  Lymphadenopathy:    She has no cervical adenopathy.  Neurological: She is alert and oriented to person, place, and time.  Skin: Skin is dry.  Psychiatric: She has a normal mood and  affect. Her behavior is normal.          Assessment & Plan:  Acute frontal sinusitis/right ear pain-discussed with patient it seems like infection is resolving with antibiotic. Continue Bactrim until its completion. I will add on a course of prednisone to help with a possible eustachian tube dysfunction and unresolved inflammation. Encouraged her to continue Flonase with nasal saline at other times to help moisturize. Follow-up if not improving or symptoms worsening.  Right flank pain-I do not see any symptoms consistent with urinary tract infection. Bactrim should've also treated this as well. I do feel like patient is likely strained or pulled a muscle due to her coughing. Suggested anti-inflammatories as well as we will give her a shot of Toradol 30 mg IM in office today. Continue to use Norco as needed but sparingly. If symptoms changing or not resolving please follow-up.

## 2014-06-23 NOTE — Telephone Encounter (Signed)
Pt stated that she has not. She asked if the counselors here will accept the [employer] [place] insurance? Will fwd to pcp.Audelia Hives [place]

## 2014-06-23 NOTE — Telephone Encounter (Signed)
Not sure but I would thik so Can given her number downstairs to call.

## 2014-06-25 NOTE — Telephone Encounter (Signed)
Pt can be seen downstairs with the [facility] assistance she will need a referral.Dyron Kawano, Lahoma Crocker

## 2014-07-02 ENCOUNTER — Other Ambulatory Visit: Payer: Self-pay | Admitting: *Deleted

## 2014-07-02 MED ORDER — HYDROCODONE-ACETAMINOPHEN 7.5-325 MG PO TABS: ORAL_TABLET | ORAL | Status: DC

## 2014-07-02 MED ORDER — LOSARTAN POTASSIUM 50 MG PO TABS: ORAL_TABLET | ORAL | Status: DC

## 2014-07-02 MED ORDER — DIAZEPAM 10 MG PO TABS: 5.0000 mg | ORAL_TABLET | Freq: Two times a day (BID) | ORAL | Status: DC | PRN

## 2014-07-02 MED ORDER — TOPIRAMATE 100 MG PO TABS: 100.0000 mg | ORAL_TABLET | Freq: Two times a day (BID) | ORAL | Status: DC

## 2014-07-02 MED ORDER — LIOTHYRONINE SODIUM 5 MCG PO TABS: 5.0000 ug | ORAL_TABLET | ORAL | Status: DC

## 2014-07-02 MED ORDER — HYDROCHLOROTHIAZIDE 25 MG PO TABS: ORAL_TABLET | ORAL | Status: DC

## 2014-07-02 NOTE — Telephone Encounter (Signed)
Pt stated that she will need all of her meds printed since [facility].Germaine Ripp, Lahoma Crocker

## 2014-07-28 ENCOUNTER — Other Ambulatory Visit: Payer: Self-pay

## 2014-07-28 ENCOUNTER — Ambulatory Visit (INDEPENDENT_AMBULATORY_CARE_PROVIDER_SITE_OTHER): Payer: Self-pay | Admitting: Physician Assistant

## 2014-07-28 ENCOUNTER — Encounter (HOSPITAL_COMMUNITY): Payer: Self-pay | Admitting: Physician Assistant

## 2014-07-28 VITALS — BP 120/80 | HR 63 | Ht 66.0 in | Wt 240.0 lb

## 2014-07-28 DIAGNOSIS — F4321 Adjustment disorder with depressed mood: Secondary | ICD-10-CM

## 2014-07-28 DIAGNOSIS — F331 Major depressive disorder, recurrent, moderate: Secondary | ICD-10-CM

## 2014-07-28 DIAGNOSIS — Z634 Disappearance and death of family member: Secondary | ICD-10-CM

## 2014-07-28 DIAGNOSIS — F4329 Adjustment disorder with other symptoms: Secondary | ICD-10-CM

## 2014-07-28 DIAGNOSIS — F4320 Adjustment disorder, unspecified: Secondary | ICD-10-CM

## 2014-07-28 DIAGNOSIS — F329 Major depressive disorder, single episode, unspecified: Secondary | ICD-10-CM

## 2014-07-28 MED ORDER — DIAZEPAM 10 MG PO TABS: 5.0000 mg | ORAL_TABLET | Freq: Two times a day (BID) | ORAL | Status: DC | PRN

## 2014-07-28 MED ORDER — GABAPENTIN 300 MG PO CAPS: 300.0000 mg | ORAL_CAPSULE | Freq: Three times a day (TID) | ORAL | Status: DC

## 2014-07-28 MED ORDER — LIOTHYRONINE SODIUM 5 MCG PO TABS: 5.0000 ug | ORAL_TABLET | ORAL | Status: DC

## 2014-07-28 MED ORDER — LOSARTAN POTASSIUM 50 MG PO TABS: ORAL_TABLET | ORAL | Status: DC

## 2014-07-28 MED ORDER — HYDROCODONE-ACETAMINOPHEN 7.5-325 MG PO TABS: ORAL_TABLET | ORAL | Status: DC

## 2014-07-28 MED ORDER — TOPIRAMATE 100 MG PO TABS: 100.0000 mg | ORAL_TABLET | Freq: Two times a day (BID) | ORAL | Status: DC
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
## 2014-07-28 NOTE — Patient Instructions (Signed)
1. Take the Neurontin as now written. 2. Add Vitamin D3 (5000) IU to your daily regimen. 3. Add B Complex 1 a day to your daily regimen. 4. Call this office for questions or problems. 5. Take your medication only as it is written. 6. Follow up in 3-4 weeks.

## 2014-07-28 NOTE — Telephone Encounter (Signed)
Patient must schedule a follow up appointment before more refills.

## 2014-08-21 ENCOUNTER — Ambulatory Visit: Payer: Self-pay | Admitting: Family Medicine

## 2014-08-27 ENCOUNTER — Ambulatory Visit (HOSPITAL_COMMUNITY): Payer: Self-pay | Admitting: Physician Assistant

## 2014-08-27 ENCOUNTER — Encounter: Payer: Self-pay | Admitting: Family Medicine

## 2014-08-27 ENCOUNTER — Ambulatory Visit (INDEPENDENT_AMBULATORY_CARE_PROVIDER_SITE_OTHER): Payer: Self-pay | Admitting: Family Medicine

## 2014-08-27 ENCOUNTER — Telehealth: Payer: Self-pay | Admitting: Family Medicine

## 2014-08-27 VITALS — BP 104/65 | HR 78 | Ht 66.0 in | Wt 236.0 lb

## 2014-08-27 DIAGNOSIS — A084 Viral intestinal infection, unspecified: Secondary | ICD-10-CM

## 2014-08-27 DIAGNOSIS — M25569 Pain in unspecified knee: Secondary | ICD-10-CM

## 2014-08-27 DIAGNOSIS — G8929 Other chronic pain: Secondary | ICD-10-CM

## 2014-08-27 DIAGNOSIS — G4700 Insomnia, unspecified: Secondary | ICD-10-CM

## 2014-08-27 DIAGNOSIS — M25562 Pain in left knee: Secondary | ICD-10-CM | POA: Insufficient documentation

## 2014-08-27 DIAGNOSIS — Z8279 Family history of other congenital malformations, deformations and chromosomal abnormalities: Secondary | ICD-10-CM

## 2014-08-27 DIAGNOSIS — G35 Multiple sclerosis: Secondary | ICD-10-CM

## 2014-08-27 DIAGNOSIS — F418 Other specified anxiety disorders: Secondary | ICD-10-CM

## 2014-08-27 MED ORDER — DIAZEPAM 10 MG PO TABS: 5.0000 mg | ORAL_TABLET | Freq: Two times a day (BID) | ORAL | Status: DC | PRN

## 2014-08-27 MED ORDER — HYDROCODONE-ACETAMINOPHEN 7.5-325 MG PO TABS: ORAL_TABLET | ORAL | Status: DC

## 2014-08-27 NOTE — Telephone Encounter (Signed)
Forms for patient assistance for Viibryd completed, faxed, scanned, confirmation received.Ashley Gomez

## 2014-08-27 NOTE — Progress Notes (Addendum)
Subjective:    Patient ID: Ashley Gomez, female    DOB: 07-27-1970, 44 y.o.   MRN: [id number]  HPI Follow-up anxiety depression. I have not seen her in 3 months. She did see Nena Polio downstairs to behavioral health about 4 weeks ago. She increased her gabapentin dose. And recommend that she start vitamin D and B  supplementation. She has been on her new regimen for close to 4 weeks. In fact she was supposed to follow-up with behavioral health either this week or last week. She did bring in paperwork for [name] to complete to help her continue to get assistance for her medications through the drug company. She was supposed to f/u today with behavioral health but had to be reschedule bc the provider was out.  She is scheduled for 2 weeks.    Patient reports on Saturday started having some vomiting and diarrhea. She started feeling better by Tuesday afternoon.  Insomnia-she is back on her trazodone for sleep.  Chronic knee pain-she's undergone discussed supplementation as well as physical therapy. She currently uses hydrocodone 90 tabs per month. She is requesting a refill today. It was last filled 4 weeks ago.  Has an indentical twin who has had to have a valve replacment. She was born bicuspid aortic vavle and they recommend she gets evaluated as well.    MS-she hasn't seen a neurologist in years but would like to do so if it's possible within the colon system.   Review of Systems     Objective:   Physical Exam  Constitutional: She is oriented to person, place, and time. She appears well-developed and well-nourished.  HENT:  Head: Normocephalic and atraumatic.  Right Ear: External ear normal.  Left Ear: External ear normal.  Nose: Nose normal.  Mouth/Throat: Oropharynx is clear and moist.  TMs and canals are clear.   Eyes: Conjunctivae and EOM are normal. Pupils are equal, round, and reactive to light.  Neck: Neck supple. No thyromegaly present.  Cardiovascular: Normal rate,  regular rhythm and normal heart sounds.   Pulmonary/Chest: Effort normal and breath sounds normal. She has no wheezes.  Lymphadenopathy:    She has no cervical adenopathy.  Neurological: She is alert and oriented to person, place, and time.  Skin: Skin is warm and dry.  Psychiatric: She has a normal mood and affect.          Assessment & Plan:  Depression/anxiety-we'll try to get preauthorization approval for her Viibryd. She is now on increased dose of gabapentin. She has been more sleepy but not sure if helping or not.   Insomnia - back on the trazodone and helping but sleep is still fair.  Feels like having more nightmares on the medication but is not sure if that was really a trigger or not..    Chronic knee pain - Will refill her hydrocodone today.    Most likely had viral gastroenteritis based on her description.she is feeling much better today.  Her blood pressures a little bit low. I suspect she is a little mildly dehydrated. She is feeling a little bit better today. Encouraged her to try to hydrate well over the next couple days. She also reports that she drinks a pot of coffee a day. Encouraged her to cut back on her caffeine intake. This could certainly be contributing to some of her GI issues in addition to her frequent daily chronic headaches.  First relative with bicuspid aortic valve that required replacement, and her twins Sr.-will schedule  for echocardiogram to evaluate her valve.   MS-Will try pressing referral internally for neurology since she is getting some assistance through [facility].

## 2014-09-03 ENCOUNTER — Telehealth: Payer: Self-pay | Admitting: *Deleted

## 2014-09-03 MED ORDER — VILAZODONE HCL 40 MG PO TABS: 40.0000 mg | ORAL_TABLET | Freq: Every day | ORAL | Status: DC

## 2014-09-03 NOTE — Telephone Encounter (Signed)
Spoke w/Ashley Gomez she stated that they would need an Rx to be faxed to [phone] to finish processing her application.Ashley Gomez

## 2014-09-03 NOTE — Telephone Encounter (Signed)
lvm asking for rtn call w/regards to pt's application.Ashley Gomez

## 2014-09-09 ENCOUNTER — Encounter: Payer: Self-pay | Admitting: *Deleted

## 2014-09-10 ENCOUNTER — Ambulatory Visit (INDEPENDENT_AMBULATORY_CARE_PROVIDER_SITE_OTHER): Payer: Self-pay | Admitting: Physician Assistant

## 2014-09-10 ENCOUNTER — Encounter (HOSPITAL_COMMUNITY): Payer: Self-pay | Admitting: Physician Assistant

## 2014-09-10 VITALS — BP 122/68 | HR 64 | Ht 66.0 in | Wt 234.0 lb

## 2014-09-10 DIAGNOSIS — F331 Major depressive disorder, recurrent, moderate: Secondary | ICD-10-CM

## 2014-09-10 DIAGNOSIS — F4321 Adjustment disorder with depressed mood: Secondary | ICD-10-CM

## 2014-09-10 DIAGNOSIS — F4329 Adjustment disorder with other symptoms: Secondary | ICD-10-CM

## 2014-09-10 DIAGNOSIS — Z634 Disappearance and death of family member: Secondary | ICD-10-CM

## 2014-09-10 MED ORDER — TOPIRAMATE 100 MG PO TABS: ORAL_TABLET | ORAL | Status: DC
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
## 2014-09-17 ENCOUNTER — Telehealth: Payer: Self-pay

## 2014-09-17 NOTE — Telephone Encounter (Signed)
Patient called stated that she is back to having knee pain again. Patient wanted to know if she can have another knee injection.  Last Ortho Visc was 01/27/2014 Ashley Gomez

## 2014-09-17 NOTE — Telephone Encounter (Signed)
Yes she can.

## 2014-09-18 ENCOUNTER — Encounter: Payer: Self-pay | Admitting: Sports Medicine

## 2014-09-18 ENCOUNTER — Ambulatory Visit (INDEPENDENT_AMBULATORY_CARE_PROVIDER_SITE_OTHER): Payer: Self-pay | Admitting: Sports Medicine

## 2014-09-18 VITALS — BP 119/69 | HR 100 | Ht 66.0 in | Wt 233.0 lb

## 2014-09-18 DIAGNOSIS — M2242 Chondromalacia patellae, left knee: Secondary | ICD-10-CM

## 2014-09-18 DIAGNOSIS — G43001 Migraine without aura, not intractable, with status migrainosus: Secondary | ICD-10-CM

## 2014-09-18 MED ORDER — KETOROLAC TROMETHAMINE 60 MG/2ML IM SOLN: 60.0000 mg | Freq: Once | INTRAMUSCULAR | Status: DC

## 2014-09-18 MED ORDER — KETOROLAC TROMETHAMINE 60 MG/2ML IM SOLN
60.0000 mg | Freq: Once | INTRAMUSCULAR | Status: AC
  Administered 2014-09-18: 60 mg via INTRAMUSCULAR

## 2014-09-18 NOTE — Assessment & Plan Note (Signed)
Moderate response to Orthovisc series last year. Steroid and Orthovisc injection #1 today. Return in one week for #2 into the left knee.

## 2014-09-18 NOTE — Telephone Encounter (Signed)
Patient was put on the scheduled to come in today to get injections done. Promiss Labarbera,CMA

## 2014-09-18 NOTE — Progress Notes (Signed)
  Subjective:    CC: Left knee pain  HPI: Akane returns, she has known patellofemoral osteoarthritis, she did moderately well after a course of Orthovisc last year. She desires to restart injections. Pain is moderate, persistent and localized under the kneecaps. No radiation, no mechanical symptoms.  Past medical history, Surgical history, Family history not pertinant except as noted below, Social history, Allergies, and medications have been entered into the medical record, reviewed, and no changes needed.   Review of Systems: No fevers, chills, night sweats, weight loss, chest pain, or shortness of breath.   Objective:    General: Well Developed, well nourished, and in no acute distress.  Neuro: Alert and oriented x3, extra-ocular muscles intact, sensation grossly intact.  HEENT: Normocephalic, atraumatic, pupils equal round reactive to light, neck supple, no masses, no lymphadenopathy, thyroid nonpalpable.  Skin: Warm and dry, no rashes. Cardiac: Regular rate and rhythm, no murmurs rubs or gallops, no lower extremity edema.  Respiratory: Clear to auscultation bilaterally. Not using accessory muscles, speaking in full sentences.  Procedure: Real-time Ultrasound Guided Injection of left knee Device: GE Logiq E  Verbal informed consent obtained.  Time-out conducted.  Noted no overlying erythema, induration, or other signs of local infection.  Skin prepped in a sterile fashion.  Local anesthesia: Topical Ethyl chloride.  With sterile technique and under real time ultrasound guidance:  2 mL kenalog 40, 4 mL lidocaine injected easily, syringe switched and 30 mg/2 mL of OrthoVisc (sodium hyaluronate) in a prefilled syringe was injected easily into the knee through a 22-gauge needle. Completed without difficulty  Pain immediately resolved suggesting accurate placement of the medication.  Advised to call if fevers/chills, erythema, induration, drainage, or persistent bleeding.  Images  permanently stored and available for review in the ultrasound unit.  Impression: Technically successful ultrasound guided injection.  Impression and Recommendations:

## 2014-09-24 ENCOUNTER — Other Ambulatory Visit: Payer: Self-pay | Admitting: *Deleted

## 2014-09-24 ENCOUNTER — Telehealth: Payer: Self-pay | Admitting: Sports Medicine

## 2014-09-24 ENCOUNTER — Encounter: Payer: Self-pay | Admitting: Sports Medicine

## 2014-09-24 ENCOUNTER — Ambulatory Visit (INDEPENDENT_AMBULATORY_CARE_PROVIDER_SITE_OTHER): Payer: Self-pay | Admitting: Sports Medicine

## 2014-09-24 VITALS — BP 123/77 | HR 93 | Wt 231.0 lb

## 2014-09-24 DIAGNOSIS — F331 Major depressive disorder, recurrent, moderate: Secondary | ICD-10-CM

## 2014-09-24 DIAGNOSIS — M2242 Chondromalacia patellae, left knee: Secondary | ICD-10-CM

## 2014-09-24 MED ORDER — HYDROCODONE-ACETAMINOPHEN 7.5-325 MG PO TABS: ORAL_TABLET | ORAL | Status: DC

## 2014-09-24 MED ORDER — ARIPIPRAZOLE 5 MG PO TABS: 5.0000 mg | ORAL_TABLET | Freq: Every day | ORAL | Status: DC

## 2014-09-24 MED ORDER — DIAZEPAM 10 MG PO TABS: 5.0000 mg | ORAL_TABLET | Freq: Two times a day (BID) | ORAL | Status: DC | PRN

## 2014-09-24 NOTE — Progress Notes (Signed)
  Subjective:    CC: Follow-up  HPI: Left knee osteoarthritis : Orthovisc injection #2 of 4 today, somehow her pain is worse despite steroid and Orthovisc injection to the last visit.  Major depression: Uncontrolled, persistently tearful in the office. No suicidal or homicidal ideation  Past medical history, Surgical history, Family history not pertinant except as noted below, Social history, Allergies, and medications have been entered into the medical record, reviewed, and no changes needed.   Review of Systems: No fevers, chills, night sweats, weight loss, chest pain, or shortness of breath.   Objective:    General: Well Developed, well nourished, and in no acute distress.  Neuro: Alert and oriented x3, extra-ocular muscles intact, sensation grossly intact.  HEENT: Normocephalic, atraumatic, pupils equal round reactive to light, neck supple, no masses, no lymphadenopathy, thyroid nonpalpable.  Skin: Warm and dry, no rashes. Cardiac: Regular rate and rhythm, no murmurs rubs or gallops, no lower extremity edema.  Respiratory: Clear to auscultation bilaterally. Not using accessory muscles, speaking in full sentences.  Procedure: Real-time Ultrasound Guided Injection of left knee Device: GE Logiq E  Verbal informed consent obtained.  Time-out conducted.  Noted no overlying erythema, induration, or other signs of local infection.  Skin prepped in a sterile fashion.  Local anesthesia: Topical Ethyl chloride.  With sterile technique and under real time ultrasound guidance:   30 mg/2 mL of OrthoVisc (sodium hyaluronate) in a prefilled syringe was injected easily into the knee through a 22-gauge needle. Completed without difficulty  Pain immediately resolved suggesting accurate placement of the medication.  Advised to call if fevers/chills, erythema, induration, drainage, or persistent bleeding.  Images permanently stored and available for review in the ultrasound unit.  Impression:  Technically successful ultrasound guided injection.  Impression and Recommendations:

## 2014-09-24 NOTE — Assessment & Plan Note (Signed)
Orthovisc injection #2 of 4 as above. Return in one week for #3. Patient tells me she is feeling worse however she is chronically and severely depressed, we will never be a control her pain until her depression is controlled.

## 2014-09-24 NOTE — Telephone Encounter (Signed)
Patient called and said even using the GoodRx prescription discount the Rx for Abilify will cost her $194.03 for quantity #30. Looked for savings card, we do not have any. Please advise of a different Rx that the Pt may be able to afford.

## 2014-09-24 NOTE — Telephone Encounter (Signed)
[place] she be amenable to try wellbutrin started at 150mg  daily, we often use this for depression augmentation purposes?

## 2014-09-24 NOTE — Assessment & Plan Note (Signed)
Still depressed on Viibryd and Trazodone. Adding abilify. Followup with psych.

## 2014-09-25 ENCOUNTER — Other Ambulatory Visit: Payer: Self-pay | Admitting: *Deleted

## 2014-09-25 MED ORDER — BUPROPION HCL ER (XL) 150 MG PO TB24: 150.0000 mg | ORAL_TABLET | ORAL | Status: DC

## 2014-09-25 NOTE — Telephone Encounter (Signed)
Called in Wellbutrin

## 2014-09-25 NOTE — Telephone Encounter (Signed)
Attempted to contact Pt (per request) to advise of Rx. No answer and no voicemail set up.

## 2014-09-25 NOTE — Telephone Encounter (Signed)
Called and spoke with Pt and the Wellbutrin would be a lot cheaper. Depending on quantity it would range from $18-25/month.  Patient also questioned rather her Viibryd samples were at the office yet, advised they have not arrived and we will contact her when they do. Pt states she only has 4 days left on the samples she has.

## 2014-09-28 ENCOUNTER — Telehealth: Payer: Self-pay | Admitting: Family Medicine

## 2014-09-28 ENCOUNTER — Ambulatory Visit (HOSPITAL_COMMUNITY): Payer: Self-pay | Admitting: Licensed Clinical Social Worker

## 2014-09-28 NOTE — Telephone Encounter (Signed)
Update has been made in chart, thank you.

## 2014-09-28 NOTE — Telephone Encounter (Signed)
Patient walked-in and adv she needs to have pharmacy changed to [facility] in [place] would like all prescriptions sent there from now on but she will go to [facility] on [address] and pick up med today since its already been sent. Thanks.

## 2014-09-29 ENCOUNTER — Ambulatory Visit (INDEPENDENT_AMBULATORY_CARE_PROVIDER_SITE_OTHER): Payer: Self-pay | Admitting: Licensed Clinical Social Worker

## 2014-09-29 DIAGNOSIS — F4310 Post-traumatic stress disorder, unspecified: Secondary | ICD-10-CM

## 2014-09-29 DIAGNOSIS — F341 Dysthymic disorder: Secondary | ICD-10-CM
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
## 2014-10-01 ENCOUNTER — Ambulatory Visit (INDEPENDENT_AMBULATORY_CARE_PROVIDER_SITE_OTHER): Payer: Self-pay | Admitting: Sports Medicine

## 2014-10-01 ENCOUNTER — Other Ambulatory Visit (HOSPITAL_COMMUNITY): Payer: Self-pay | Admitting: Family Medicine

## 2014-10-01 ENCOUNTER — Telehealth: Payer: Self-pay | Admitting: Family Medicine

## 2014-10-01 ENCOUNTER — Encounter: Payer: Self-pay | Admitting: Sports Medicine

## 2014-10-01 VITALS — BP 104/68 | HR 92 | Ht 66.0 in | Wt 235.0 lb

## 2014-10-01 DIAGNOSIS — M2242 Chondromalacia patellae, left knee: Secondary | ICD-10-CM

## 2014-10-01 DIAGNOSIS — Z8279 Family history of other congenital malformations, deformations and chromosomal abnormalities: Secondary | ICD-10-CM

## 2014-10-01 DIAGNOSIS — F331 Major depressive disorder, recurrent, moderate: Secondary | ICD-10-CM

## 2014-10-01 MED ORDER — HYDROCODONE-ACETAMINOPHEN 10-325 MG PO TABS: 1.0000 | ORAL_TABLET | Freq: Three times a day (TID) | ORAL | Status: DC | PRN

## 2014-10-01 NOTE — Telephone Encounter (Signed)
Based on [facility] financial assistance, no authorization required for 2D echo w/o contrast.

## 2014-10-01 NOTE — Assessment & Plan Note (Signed)
Appears to be improved with the addition of Wellbutrin at the last visit, Abilify was too expensive. She is only been on it for several days now. She will follow-up with her PCP, certainly increasing to 300 mg could be the next option.

## 2014-10-01 NOTE — Assessment & Plan Note (Addendum)
Orthovisc injection #3 of 4 as above. Return in one week for #4. Not having good relief of the pain so I am going to go ahead and set her up for consideration of arthroscopy, she does have patellofemoral chondromalacia with some grade for fissuring on MRI as well as degenerative fraying of her medial meniscus, without mechanical symptoms. I'm going to add a small amount of 10 mg hydrocodone, she will then return to her 7.5 mg dose.

## 2014-10-01 NOTE — Progress Notes (Signed)
  Subjective:    CC: Follow-up  HPI: Left knee patellofemoral chondromalacia: With some degenerative meniscal fraying as well as areas of grade 4 chondromalacia and fissuring in the patellofemoral joint. We have tried several injections, including steroids, we are currently on Orthovisc injection #3 today. Overall not much better. She has already failed physical therapy.  Depression: Try to add Abilify however this was too expensive, added Wellbutrin at the last visit, she is only been on this medicine for a few days now, no longer tearful. She does have follow-ups with her PCP and psychiatry. No suicidal or homicidal ideation.  Past medical history, Surgical history, Family history not pertinant except as noted below, Social history, Allergies, and medications have been entered into the medical record, reviewed, and no changes needed.   Review of Systems: No fevers, chills, night sweats, weight loss, chest pain, or shortness of breath.   Objective:    General: Well Developed, well nourished, and in no acute distress.  Neuro: Alert and oriented x3, extra-ocular muscles intact, sensation grossly intact.  HEENT: Normocephalic, atraumatic, pupils equal round reactive to light, neck supple, no masses, no lymphadenopathy, thyroid nonpalpable.  Skin: Warm and dry, no rashes. Cardiac: Regular rate and rhythm, no murmurs rubs or gallops, no lower extremity edema.  Respiratory: Clear to auscultation bilaterally. Not using accessory muscles, speaking in full sentences.  Procedure: Real-time Ultrasound Guided Injection of left knee Device: GE Logiq E  Verbal informed consent obtained.  Time-out conducted.  Noted no overlying erythema, induration, or other signs of local infection.  Skin prepped in a sterile fashion.  Local anesthesia: Topical Ethyl chloride.  With sterile technique and under real time ultrasound guidance:   30 mg/2 mL of OrthoVisc (sodium hyaluronate) in a prefilled syringe was  injected easily into the suprapatellar recess through a 22-gauge needle. Completed without difficulty  Pain immediately resolved suggesting accurate placement of the medication.  Advised to call if fevers/chills, erythema, induration, drainage, or persistent bleeding.  Images permanently stored and available for review in the ultrasound unit.  Impression: Technically successful ultrasound guided injection.  Impression and Recommendations:

## 2014-10-01 NOTE — Telephone Encounter (Signed)
Patient aware of echo appt scheduled for [facility] on 10-14-14 at 11:00Am

## 2014-10-06 ENCOUNTER — Ambulatory Visit (HOSPITAL_COMMUNITY): Payer: Self-pay | Admitting: Licensed Clinical Social Worker

## 2014-10-08 ENCOUNTER — Ambulatory Visit (INDEPENDENT_AMBULATORY_CARE_PROVIDER_SITE_OTHER): Payer: Self-pay | Admitting: Sports Medicine

## 2014-10-08 ENCOUNTER — Encounter (HOSPITAL_COMMUNITY): Payer: Self-pay | Admitting: Physician Assistant

## 2014-10-08 ENCOUNTER — Ambulatory Visit (INDEPENDENT_AMBULATORY_CARE_PROVIDER_SITE_OTHER): Payer: Self-pay | Admitting: Physician Assistant

## 2014-10-08 ENCOUNTER — Encounter: Payer: Self-pay | Admitting: Family Medicine

## 2014-10-08 ENCOUNTER — Encounter: Payer: Self-pay | Admitting: Sports Medicine

## 2014-10-08 ENCOUNTER — Ambulatory Visit (INDEPENDENT_AMBULATORY_CARE_PROVIDER_SITE_OTHER): Payer: Self-pay | Admitting: Family Medicine

## 2014-10-08 VITALS — BP 114/80 | HR 78 | Wt 234.0 lb

## 2014-10-08 VITALS — BP 110/85 | HR 86 | Ht 66.0 in | Wt 235.0 lb

## 2014-10-08 DIAGNOSIS — F1190 Opioid use, unspecified, uncomplicated: Secondary | ICD-10-CM

## 2014-10-08 DIAGNOSIS — F329 Major depressive disorder, single episode, unspecified: Secondary | ICD-10-CM

## 2014-10-08 DIAGNOSIS — M2242 Chondromalacia patellae, left knee: Secondary | ICD-10-CM

## 2014-10-08 DIAGNOSIS — F4321 Adjustment disorder with depressed mood: Secondary | ICD-10-CM

## 2014-10-08 DIAGNOSIS — I10 Essential (primary) hypertension: Secondary | ICD-10-CM

## 2014-10-08 DIAGNOSIS — G4700 Insomnia, unspecified: Secondary | ICD-10-CM

## 2014-10-08 DIAGNOSIS — F331 Major depressive disorder, recurrent, moderate: Secondary | ICD-10-CM

## 2014-10-08 DIAGNOSIS — F4310 Post-traumatic stress disorder, unspecified: Secondary | ICD-10-CM

## 2014-10-08 DIAGNOSIS — F4329 Adjustment disorder with other symptoms: Secondary | ICD-10-CM

## 2014-10-08 DIAGNOSIS — Z634 Disappearance and death of family member: Secondary | ICD-10-CM

## 2014-10-08 DIAGNOSIS — Z13220 Encounter for screening for lipoid disorders: Secondary | ICD-10-CM

## 2014-10-08 DIAGNOSIS — F411 Generalized anxiety disorder: Secondary | ICD-10-CM

## 2014-10-08 NOTE — Progress Notes (Signed)
[facility] MD Progress Note  10/08/2014 8:58 AM Ashley Gomez  MRN:  [id number] Subjective:  Ashley Gomez is in to follow up on her depression, anxiety and complicated grief. Patient has been in to see OPT and also has a diagnosis of PTSD. She feels that she is about the same. Her Zung scale for depression is a 83 which is a moderate depression. Principal Problem: MDD, PTSD, GAD and complicated grief. Diagnosis:   Patient Active Problem List   Diagnosis Date Noted  . Chronic knee pain [M25.569, G89.29] 08/27/2014  . Superficial thrombophlebitis [I80.9] 09/15/2013  . Menopausal and perimenopausal disorder [N95.9] 10/04/2012  . Chondromalacia of patellofemoral joint [M22.40] 12/21/2011  . Lateral epicondylitis of right elbow [M77.11] 10/18/2010  . B12 DEFICIENCY [E53.8] 06/21/2010  . UNSPECIFIED ANEMIA [D64.9] 06/21/2010  . NECK PAIN, CHRONIC [M54.2] 06/21/2010  . VARICOSE VEINS, LOWER EXTREMITIES [I83.90] 05/17/2010  . ABSENCE OF MENSTRUATION [N91.2] 05/17/2010  . POSTURAL LIGHTHEADEDNESS [R42] 05/17/2010  . NUMBNESS, ARM [R20.9] 04/04/2010  . DVT [I82.409] 01/13/2010  . SEIZURE DISORDER [R56.9] 01/13/2010  . IRREGULAR MENSES [N92.6] 06/01/2009  . Depression, major, recurrent, moderate [F33.1] 05/16/2009  . OBESITY, UNSPECIFIED [E66.9] 03/09/2009  . Anxiety state [F41.1] 03/09/2009  . CIGARETTE SMOKER [Z72.0] 03/09/2009  . MULTIPLE SCLEROSIS, RELAPSING/REMITTING [G35] 03/09/2009  . MIGRAINE WITHOUT AURA [X32.355] 03/09/2009  . ESSENTIAL HYPERTENSION, BENIGN [I10] 03/09/2009  . SCIATICA, CHRONIC [M54.30] 03/09/2009   Total Time spent with patient: 30 minutes   Past Medical History:  Past Medical History  Diagnosis Date  . Hypertension   . Depression   . Anxiety   . Seizures   . MS (multiple sclerosis)   . Obesity   . Smoking   . Migraines   . Facet hypertrophy of lumbar region     MRI 2007  . DVT (deep venous thrombosis) 73/22/0254    L basilic vein   . CVA (cerebral infarction)      Old CVA on MRI brain    Past Surgical History  Procedure Laterality Date  . Tubal ligation  1992  . Tonsillectomy     Family History:  Family History  Problem Relation Age of Onset  . Cancer Mother 13    melanoma  . Hypertension Sister   . Heart disease Sister     valve disease  . Depression Sister   . Diabetes Sister   . Sjogren's syndrome Sister    Social History:  History  Alcohol Use  . 0.0 oz/week  . 0 Standard drinks or equivalent per week    Comment: occasional     History  Drug Use No    History   Social History  . Marital Status: Single    Spouse Name: N/A  . Number of Children: N/A  . Years of Education: N/A   Social History Main Topics  . Smoking status: Current Every Day Smoker -- 1.00 packs/day for 25 years    Types: Cigarettes  . Smokeless tobacco: Never Used  . Alcohol Use: 0.0 oz/week    0 Standard drinks or equivalent per week     Comment: occasional  . Drug Use: No  . Sexual Activity: No   Other Topics Concern  . None   Social History Narrative   Additional History:    Sleep: Poor  Appetite:  Poor   Assessment:   Musculoskeletal: Strength & Muscle Tone: within normal limits Gait & Station: normal Patient leans: Normal   Psychiatric Specialty Exam: Physical Exam  ROS  Blood pressure 110/85,  pulse 86, height 5\' 6"  (1.676 m), weight 235 lb (106.595 kg).Body mass index is 37.95 kg/(m^2).  General Appearance: Casual  Eye Contact::  Good  Speech:  Clear and Coherent  Volume:  Normal  Mood:  Anxious and Depressed  Affect:  Congruent  Thought Process:  Goal Directed  Orientation:  Full (Time, Place, and Person)  Thought Content:  WDL  Suicidal Thoughts:  No  Homicidal Thoughts:  No  Memory:  Immediate;   Fair Recent;   Fair Remote;   Fair  Judgement:  Intact  Insight:  Present  Psychomotor Activity:  Normal  Concentration:  Fair  Recall:  [employer] of Knowledge:Fair  Language: Fair  Akathisia:  No  Handed:  Right   AIMS (if indicated):     Assets:  Communication Skills Desire for Improvement  ADL's:  Intact  Cognition: WNL  Sleep:        Current Medications: Current Outpatient Prescriptions  Medication Sig Dispense Refill  . buPROPion (WELLBUTRIN XL) 150 MG 24 hr tablet Take 1 tablet (150 mg total) by mouth every morning. 90 tablet 0  . cyanocobalamin 1000 MCG tablet Take 100 mcg by mouth daily.    . diazepam (VALIUM) 10 MG tablet Take 0.5-1 tablets (5-10 mg total) by mouth every 12 (twelve) hours as needed for anxiety. 60 tablet 0  . diphenoxylate-atropine (LOMOTIL) 2.5-0.025 MG per tablet Take 1 tablet by mouth 4 (four) times daily as needed for diarrhea or loose stools. 30 tablet 0  . ergocalciferol (VITAMIN D2) 50000 UNITS capsule Take 50,000 Units by mouth once a week.    . gabapentin (NEURONTIN) 300 MG capsule Take 1 capsule (300 mg total) by mouth 3 (three) times daily. 90 capsule 2  . hydrochlorothiazide (HYDRODIURIL) 25 MG tablet TAKE ONE TABLET BY MOUTH DAILY AS NEEDED 30 tablet 1  . liothyronine (CYTOMEL) 5 MCG tablet Take 1 tablet (5 mcg total) by mouth every morning. 30 tablet 1  . losartan (COZAAR) 50 MG tablet TAKE ONE TABLET BY MOUTH ONE TIME DAILY 30 tablet 1  . SUMAtriptan (IMITREX) 100 MG tablet Take 1 tablet (100 mg total) by mouth every 2 (two) hours as needed for migraine. No more than 2 in 24 hours. 10 tablet 6  . topiramate (TOPAMAX) 100 MG tablet Take one tablet by mouth in the AM and two tablets in the evening for mood stabilization a. 90 tablet 0  . Vilazodone HCl (VIIBRYD) 40 MG TABS Take 1 tablet (40 mg total) by mouth daily. 90 tablet 3  . traZODone (DESYREL) 50 MG tablet Take 50 mg by mouth at bedtime as needed for sleep.     No current facility-administered medications for this visit.    Lab Results: No results found for this or any previous visit (from the past 48 hour(s)).  Physical Findings: AIMS:  , ,  ,  ,    CIWA:    COWS:     Treatment Plan  Summary: Medication management   Medical Decision Making:  Review of New Medication or Change in Dosage (2) and Review or Order of Psychological Test(s) (1)  1. Will continue current meds as written. 2. Want to D/C the Viibryd due to cost and poor efficacy. 3. Have patient to research needymeds.org for ABILIFY AND aripiprazole for mood stabilizer, but can also consider Lithium as a possibility. 4. Will see back in short follow up.  Marlane Hatcher. Ray Glacken RPAC 9:05 AM 10/08/2014

## 2014-10-08 NOTE — Progress Notes (Signed)
   Subjective:    Patient ID: Ashley Gomez, female    DOB: March 12, 1971, 44 y.o.   MRN: [id number]  HPI Insomnia - Still really struggling with sleep. Waking up frequently with nightmares and reliving things from the death of her child. This is been an ongoing issue.  Behavioral health- she was seen earlier this morning. They inc her gabapentin  And her topamax. They have formally dx her with PTSD. They are thinking about switching the Viibryd to Abilify. She is trying to check into ways to get the medication covered as far as expenses or concerns that she doesn't have health insurance. Right now she is actually getting Viibryd directly through the company. Now on wellbutrin as well.  Says the last 2 days has felt some better.   Hypertension- Pt denies chest pain, SOB, dizziness, or heart palpitations.  Taking meds as directed w/o problems.  Denies medication side effects.    Review of Systems     Objective:   Physical Exam  Constitutional: She is oriented to person, place, and time. She appears well-developed and well-nourished.  HENT:  Head: Normocephalic and atraumatic.  Cardiovascular: Normal rate, regular rhythm and normal heart sounds.   Pulmonary/Chest: Effort normal and breath sounds normal.  Neurological: She is alert and oriented to person, place, and time.  Skin: Skin is warm and dry.  Psychiatric: She has a normal mood and affect. Her behavior is normal.          Assessment & Plan:  Insomnia - she still really struggling with sleep issues. I discussed with her that at this point I want to hold off on any additional treatment since her gabapentin and Topamax both were increased this morning. Most of these can cause sedation and may actually help with her sleep but I want her to see how she's going to react to this before we do anything else. She does take the trazodone but just occasionally and not on a nightly basis. I think that her insomnia is really not going to improve  significantly until she is able to get her mood under good control and we discussed that today.  Chronic pain medication- Dr. Aundria Mems, her sports medicine doctor has been writing her pain medications. She was previously getting hydrocodone 7.5 mg 3 times a day. One week ago he increased the dose to 10 mg and gave her 30 tabs and encouraged her follow-up and discuss that with me. I will speak with him about whether not she may need a chronic pain regimen. If so we will need to get her on a pain contract. We will continue on her current pain regimen for now. I really want to get her mood under better control. I do feel like some of her low mood is probably driving her pain levels. If I take over writing her prescriptions and we will also need to get her on a pain contract.  Due for screening lipid panel.   HTN - well controlled. Continue current regimen.   Time spent 30 minutes, greater than 50% time spent counseling about her insomnia, mood, pain management, and blood pressure.

## 2014-10-08 NOTE — Assessment & Plan Note (Signed)
Orthovisc injection #4 today, starting to feel a good response, she does have an appointment with orthopedic surgery tomorrow.

## 2014-10-08 NOTE — Patient Instructions (Signed)
1. Research Needymeds.org for discounts on Abilify/Aripiprazole. 2. Take meds as written. 3. Follow up in 2 weeks.

## 2014-10-08 NOTE — Progress Notes (Signed)
  Subjective:    CC: Follow-up  HPI: Left knee patellofemoral chondromalacia: With some degenerative meniscal fraying as well as areas of grade 4 chondromalacia and fissuring in the patellofemoral joint. She's done very well, and then started to have response now after Orthovisc No. 3, she is here for Orthovisc No. 4. She does have an appointment with Dr. Erlinda Hong with orthopedic surgery coming up tomorrow  Depression: Try to add Abilify however this was too expensive, Viibryd and Wellbutrin seemed to be doing well, her psychiatrist is going to be putting her on Abilify, they have found a cheaper way to obtain it.  Past medical history, Surgical history, Family history not pertinant except as noted below, Social history, Allergies, and medications have been entered into the medical record, reviewed, and no changes needed.   Review of Systems: No fevers, chills, night sweats, weight loss, chest pain, or shortness of breath.   Objective:    General: Well Developed, well nourished, and in no acute distress.  Neuro: Alert and oriented x3, extra-ocular muscles intact, sensation grossly intact.  HEENT: Normocephalic, atraumatic, pupils equal round reactive to light, neck supple, no masses, no lymphadenopathy, thyroid nonpalpable.  Skin: Warm and dry, no rashes. Cardiac: Regular rate and rhythm, no murmurs rubs or gallops, no lower extremity edema.  Respiratory: Clear to auscultation bilaterally. Not using accessory muscles, speaking in full sentences.  Procedure: Real-time Ultrasound Guided Injection of left knee Device: GE Logiq E  Verbal informed consent obtained.  Time-out conducted.  Noted no overlying erythema, induration, or other signs of local infection.  Skin prepped in a sterile fashion.  Local anesthesia: Topical Ethyl chloride.  With sterile technique and under real time ultrasound guidance:   30 mg/2 mL of OrthoVisc (sodium hyaluronate) in a prefilled syringe was injected easily into  the suprapatellar recess through a 22-gauge needle. Completed without difficulty  Pain immediately resolved suggesting accurate placement of the medication.  Advised to call if fevers/chills, erythema, induration, drainage, or persistent bleeding.  Images permanently stored and available for review in the ultrasound unit.  Impression: Technically successful ultrasound guided injection.  Impression and Recommendations:

## 2014-10-08 NOTE — Assessment & Plan Note (Signed)
Seems to be improving with current medication regimen
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
## 2014-10-09 ENCOUNTER — Ambulatory Visit (HOSPITAL_COMMUNITY): Payer: Self-pay | Admitting: Physician Assistant

## 2014-10-13 ENCOUNTER — Telehealth: Payer: Self-pay | Admitting: *Deleted

## 2014-10-13 NOTE — Telephone Encounter (Signed)
Sounds like a hematoma, she should come see me, and we can consider draining it, other than that, icing and pressure.also, she is not too young for an arthroscopy. Did she ask them about arthroscopy?

## 2014-10-13 NOTE — Telephone Encounter (Signed)
Pt had appt with ortho this past Fri.  Was told that she was too young for a "scraping" or for a knee replacement.  He did a hip xray & stated that she has trahanaline bursitis.  Gave her a cortisone injection.  It didn't work.  She stated that she was left with an egg/tennis ball size knot on her hip after the 20-30 min drive home.  At around 6pm it was the size of a softball & she had to take a pain pill. She wants to know what she can do for this, she is allergic to nsaids.  Her hip is still extremely tender, and the knot is about the size of a half dollar. Please advise.

## 2014-10-14 ENCOUNTER — Ambulatory Visit (HOSPITAL_BASED_OUTPATIENT_CLINIC_OR_DEPARTMENT_OTHER): Admission: RE | Admit: 2014-10-14 | Payer: Self-pay | Source: Ambulatory Visit

## 2014-10-15 ENCOUNTER — Ambulatory Visit (INDEPENDENT_AMBULATORY_CARE_PROVIDER_SITE_OTHER): Payer: Self-pay | Admitting: Licensed Clinical Social Worker

## 2014-10-15 DIAGNOSIS — F4310 Post-traumatic stress disorder, unspecified: Secondary | ICD-10-CM

## 2014-10-15 DIAGNOSIS — F341 Dysthymic disorder: Secondary | ICD-10-CM

## 2014-10-15 NOTE — Progress Notes (Signed)
   THERAPIST PROGRESS NOTE  Session Time: 10:00am-11:00am  Participation Level: Active  Behavioral Response: DisheveledAlertAnxious and Depressed, Tearful  Type of Therapy: Individual Therapy  Treatment Goals addressed: depression  Interventions: psycho-education   Suicidal/Homicidal: some vague SI without intent, able to think about how she needs to be here for her family, denies HI  Therapist Interventions: Reviewed diagnoses.  Discussed symptoms of depression and causes.   Provided patient with a description of what she can expect to work on in therapy.  Explained that the first goal will be to have her develop a better understanding of what anxiety is and how you can cope with it.  Also identified some long term goals to increase comfort outside of her home and process trauma.    Summary: Indicated she was pretty familiar with the symptoms of depression.  Acknowledged that it has been hard for her to accept that she needs psychiatric medication to function effectively.   Indicated she is open to focusing on reducing anxiety symptoms and working towards those long terms goals the therapist identified.  She said, "I need to be better for my children and grandchildren.  They need me."  Plan: Will educated patient about PTSD.   Diagnosis: Persistent Depressive Disorder                           PTSD    Ashley Gomez 10/15/2014

## 2014-10-20 ENCOUNTER — Telehealth (HOSPITAL_COMMUNITY): Payer: Self-pay | Admitting: *Deleted

## 2014-10-20 MED ORDER — TOPIRAMATE 100 MG PO TABS: ORAL_TABLET | ORAL | Status: DC

## 2014-10-20 NOTE — Telephone Encounter (Signed)
Received fax from [facility] for a refill Topiramate 100mg . Per Nena Polio, PA-C, pt is authorized for a refill for Topiramate 100mg , Qty 90. Prescription was sent to pharmacy. Pt has a f/u appt on 6/9. Called and informed pt of prescription status. Pt states and shows understanding.

## 2014-10-21 ENCOUNTER — Ambulatory Visit (HOSPITAL_BASED_OUTPATIENT_CLINIC_OR_DEPARTMENT_OTHER)
Admission: RE | Admit: 2014-10-21 | Discharge: 2014-10-21 | Disposition: A | Discharge status: Home / Self Care | Payer: No Typology Code available for payment source | Source: Ambulatory Visit | Attending: Family Medicine | Admitting: Family Medicine

## 2014-10-21 DIAGNOSIS — Z8249 Family history of ischemic heart disease and other diseases of the circulatory system: Secondary | ICD-10-CM | POA: Insufficient documentation

## 2014-10-21 DIAGNOSIS — R079 Chest pain, unspecified: Secondary | ICD-10-CM

## 2014-10-21 DIAGNOSIS — Z8279 Family history of other congenital malformations, deformations and chromosomal abnormalities: Secondary | ICD-10-CM

## 2014-10-21 NOTE — Progress Notes (Signed)
  Echocardiogram 2D Echocardiogram has been performed.  Ashley Gomez 10/21/2014, 11:57 AM

## 2014-10-22 ENCOUNTER — Ambulatory Visit (INDEPENDENT_AMBULATORY_CARE_PROVIDER_SITE_OTHER): Payer: Self-pay | Admitting: Physician Assistant

## 2014-10-22 ENCOUNTER — Encounter (HOSPITAL_COMMUNITY): Payer: Self-pay | Admitting: Physician Assistant

## 2014-10-22 VITALS — BP 130/74 | HR 73 | Ht 66.0 in | Wt 233.0 lb

## 2014-10-22 DIAGNOSIS — F331 Major depressive disorder, recurrent, moderate: Secondary | ICD-10-CM

## 2014-10-22 DIAGNOSIS — F341 Dysthymic disorder: Secondary | ICD-10-CM

## 2014-10-22 DIAGNOSIS — F4381 Prolonged grief disorder: Secondary | ICD-10-CM

## 2014-10-22 DIAGNOSIS — F329 Major depressive disorder, single episode, unspecified: Secondary | ICD-10-CM

## 2014-10-22 DIAGNOSIS — F4321 Adjustment disorder with depressed mood: Secondary | ICD-10-CM

## 2014-10-22 DIAGNOSIS — F4329 Adjustment disorder with other symptoms: Secondary | ICD-10-CM

## 2014-10-22 DIAGNOSIS — F4310 Post-traumatic stress disorder, unspecified: Secondary | ICD-10-CM

## 2014-10-22 DIAGNOSIS — Z634 Disappearance and death of family member: Secondary | ICD-10-CM

## 2014-10-22 DIAGNOSIS — F411 Generalized anxiety disorder: Secondary | ICD-10-CM

## 2014-10-22 MED ORDER — BUPROPION HCL ER (XL) 150 MG PO TB24: 150.0000 mg | ORAL_TABLET | ORAL | Status: DC

## 2014-10-22 MED ORDER — TOPIRAMATE 100 MG PO TABS: ORAL_TABLET | ORAL | Status: DC

## 2014-10-22 MED ORDER — TRAZODONE HCL 50 MG PO TABS: 50.0000 mg | ORAL_TABLET | Freq: Every evening | ORAL | Status: DC | PRN

## 2014-10-22 MED ORDER — GABAPENTIN 300 MG PO CAPS: 300.0000 mg | ORAL_CAPSULE | Freq: Three times a day (TID) | ORAL | Status: DC

## 2014-10-22 NOTE — Patient Instructions (Addendum)
1. Continue all medication as ordered.  Decrease your Viibryd from 40mg  to 20mg  a day by breaking the pill into half. Take 1/2 tablet a day.  2. Call this office if you have any questions or concerns. 3. Continue to get regular exercise 3-5 times a week. 4. Continue to eat a healthy nutritionally balanced diet. 5. Continue to reduce stress and anxiety through activities such as yoga, mindfulness, meditation and or prayer. 6. Keep all appointments with your out patient therapist and have notes forwarded to this office. (If you do not have one and would like to be scheduled with a therapist, please let our office assist you with this. 7. Follow up as planned.  Try to cut down on smoking by 3 cigarettes each week.  Practice your breathing exercises 4 x a day. Practice your "Thought Stopping" 10+ times a day!

## 2014-10-22 NOTE — Progress Notes (Signed)
Patient ID: Ashley Gomez, female   DOB: 06/15/1970, 44 y.o.   MRN: [id number] [facility] MD Progress Note  10/22/2014 8:52 AM Peytyn Trine  MRN:  [id number] Subjective:  Judieth is in to follow up on her depression, anxiety and complicated grief. She still gets overwhelmed with anxiety and just completed a Cardiac Echo yesterday. She feels that her medication is not working due to the increase in negative thoughts that are random for her. She then escalates into tears.     She did not look into Needymeds.org regarding Abilify so she is not aware of her outside costs for this medication. The goal is to d/c the Vybriid and change to a mood stabilizer. Hopefully Abilify or possibly Lithium. Principal Problem: MDD, PTSD, GAD and complicated grief. Diagnosis:   Patient Active Problem List   Diagnosis Date Noted  . Dysthymic disorder [F34.1] 10/15/2014  . PTSD (post-traumatic stress disorder) [F43.10] 10/15/2014  . Chronic knee pain [M25.569, G89.29] 08/27/2014  . Superficial thrombophlebitis [I80.9] 09/15/2013  . Menopausal and perimenopausal disorder [N95.9] 10/04/2012  . Chondromalacia of patellofemoral joint [M22.40] 12/21/2011  . Lateral epicondylitis of right elbow [M77.11] 10/18/2010  . B12 DEFICIENCY [E53.8] 06/21/2010  . UNSPECIFIED ANEMIA [D64.9] 06/21/2010  . NECK PAIN, CHRONIC [M54.2] 06/21/2010  . VARICOSE VEINS, LOWER EXTREMITIES [I83.90] 05/17/2010  . ABSENCE OF MENSTRUATION [N91.2] 05/17/2010  . POSTURAL LIGHTHEADEDNESS [R42] 05/17/2010  . NUMBNESS, ARM [R20.9] 04/04/2010  . DVT [I82.409] 01/13/2010  . SEIZURE DISORDER [R56.9] 01/13/2010  . IRREGULAR MENSES [N92.6] 06/01/2009  . Depression, major, recurrent, moderate [F33.1] 05/16/2009  . OBESITY, UNSPECIFIED [E66.9] 03/09/2009  . Anxiety state [F41.1] 03/09/2009  . CIGARETTE SMOKER [Z72.0] 03/09/2009  . MULTIPLE SCLEROSIS, RELAPSING/REMITTING [G35] 03/09/2009  . MIGRAINE WITHOUT AURA [G62.694] 03/09/2009  . ESSENTIAL HYPERTENSION,  BENIGN [I10] 03/09/2009  . SCIATICA, CHRONIC [M54.30] 03/09/2009   Total Time spent with patient: 30 minutes   Past Medical History:  Past Medical History  Diagnosis Date  . Hypertension   . Depression   . Anxiety   . Seizures   . MS (multiple sclerosis)   . Obesity   . Smoking   . Migraines   . Facet hypertrophy of lumbar region     MRI 2007  . DVT (deep venous thrombosis) 85/46/2703    L basilic vein   . CVA (cerebral infarction)     Old CVA on MRI brain    Past Surgical History  Procedure Laterality Date  . Tubal ligation  1992  . Tonsillectomy     Family History:  Family History  Problem Relation Age of Onset  . Cancer Mother 57    melanoma  . Hypertension Sister   . Heart disease Sister     valve disease  . Depression Sister   . Diabetes Sister   . Sjogren's syndrome Sister    Social History:  History  Alcohol Use  . 0.0 oz/week  . 0 Standard drinks or equivalent per week    Comment: occasional     History  Drug Use No    History   Social History  . Marital Status: Single    Spouse Name: N/A  . Number of Children: N/A  . Years of Education: N/A   Social History Main Topics  . Smoking status: Current Every Day Smoker -- 1.00 packs/day for 25 years    Types: Cigarettes  . Smokeless tobacco: Never Used  . Alcohol Use: 0.0 oz/week    0 Standard drinks or equivalent per week  Comment: occasional  . Drug Use: No  . Sexual Activity: No   Other Topics Concern  . Not on file   Social History Narrative   Additional History:    Sleep: Poor  Appetite:  Poor   Assessment:   Musculoskeletal: Strength & Muscle Tone: within normal limits Gait & Station: normal Patient leans: Normal   Psychiatric Specialty Exam: Physical Exam  ROS  There were no vitals taken for this visit.There is no weight on file to calculate BMI.  General Appearance: Casual  Eye Contact::  Good  Speech:  Clear and Coherent  Volume:  Normal  Mood:  Anxious and  Depressed  Affect:  Congruent tearful  Thought Process:  Goal Directed  Orientation:  Full (Time, Place, and Person)  Thought Content:  WDL  Suicidal Thoughts:  No  Homicidal Thoughts:  No  Memory:  Immediate;   Fair Recent;   Fair Remote;   Fair  Judgement:  Intact  Insight:  Present  Psychomotor Activity:  Normal  Concentration:  Fair  Recall:  [employer] of Knowledge:Fair  Language: Fair  Akathisia:  No  Handed:  Right  AIMS (if indicated):     Assets:  Communication Skills Desire for Improvement  ADL's:  Intact  Cognition: WNL  Sleep:  4-6 hours     Current Medications: Current Outpatient Prescriptions  Medication Sig Dispense Refill  . buPROPion (WELLBUTRIN XL) 150 MG 24 hr tablet Take 1 tablet (150 mg total) by mouth every morning. 90 tablet 0  . cyanocobalamin 1000 MCG tablet Take 100 mcg by mouth daily.    . diazepam (VALIUM) 10 MG tablet Take 0.5-1 tablets (5-10 mg total) by mouth every 12 (twelve) hours as needed for anxiety. 60 tablet 0  . ergocalciferol (VITAMIN D2) 50000 UNITS capsule Take 50,000 Units by mouth once a week.    . gabapentin (NEURONTIN) 300 MG capsule Take 1 capsule (300 mg total) by mouth 3 (three) times daily. 90 capsule 2  . hydrochlorothiazide (HYDRODIURIL) 25 MG tablet TAKE ONE TABLET BY MOUTH DAILY AS NEEDED 30 tablet 1  . HYDROcodone-acetaminophen (NORCO) 7.5-325 MG per tablet Take 1 tablet by mouth every 8 (eight) hours as needed. for pain  0  . SUMAtriptan (IMITREX) 100 MG tablet Take 1 tablet (100 mg total) by mouth every 2 (two) hours as needed for migraine. No more than 2 in 24 hours. 10 tablet 6  . topiramate (TOPAMAX) 100 MG tablet Take one tablet by mouth in the AM and two tablets in the evening for mood stabilization a. 90 tablet 0  . traZODone (DESYREL) 50 MG tablet Take 50 mg by mouth at bedtime as needed for sleep.    . Vilazodone HCl (VIIBRYD) 40 MG TABS Take 1 tablet (40 mg total) by mouth daily. 90 tablet 3  .  diphenoxylate-atropine (LOMOTIL) 2.5-0.025 MG per tablet Take 1 tablet by mouth 4 (four) times daily as needed for diarrhea or loose stools. (Patient not taking: Reported on 10/22/2014) 30 tablet 0   No current facility-administered medications for this visit.    Lab Results: No results found for this or any previous visit (from the past 48 hour(s)).  Physical Findings: AIMS:  CIWA:   COWS:    Treatment Plan Summary: Medication management   Medical Decision Making:  Review of New Medication or Change in Dosage (2) and Review or Order of Psychological Test(s) (1)  1. Patient will continue Welbutrin 150mg  and re-educated to take early in the AM  by 8AM. 2. Want to D/C the Viibryd due to cost and poor efficacy. She will break her 40mg  tablets in half and decrease the dose to 20mg  a day for the next month. 3. Have patient to research needymeds.org for ABILIFY AND aripiprazole for mood stabilizer, but can also consider Lithium as a possibility 4. She will follow up in one month. 5. She will practice thought stopping as well as her breathing exercises 4 x a day as instructed. >50% of this visit was spent in education and counseling. Marlane Hatcher. Kehinde Totzke RPAC 8:52 AM 10/22/2014

## 2014-10-23 NOTE — Telephone Encounter (Signed)
PT needs you to go to Needymeds[facility][email or web address]/generic for the abilify prescription. She can not issue the request it has to be done by the dr's office. Please let pt know when this is ready.

## 2014-10-27 ENCOUNTER — Telehealth: Payer: Self-pay | Admitting: *Deleted

## 2014-10-27 ENCOUNTER — Ambulatory Visit (INDEPENDENT_AMBULATORY_CARE_PROVIDER_SITE_OTHER): Payer: Self-pay | Admitting: Licensed Clinical Social Worker

## 2014-10-27 DIAGNOSIS — F4310 Post-traumatic stress disorder, unspecified: Secondary | ICD-10-CM

## 2014-10-27 DIAGNOSIS — F328 Other depressive episodes: Secondary | ICD-10-CM

## 2014-10-27 NOTE — Progress Notes (Signed)
THERAPIST PROGRESS NOTE  Session Time: 10:15am-11:05am  Participation Level: Active  Behavioral Response: Disheveled Alert Anxious and Depressed, Tearful  Type of Therapy: Individual Therapy  Treatment Goals addressed: depression  Interventions: psycho-education   Suicidal/Homicidal: some vague SI without intent, able to think about how she needs to be here for her family, denies HI  Therapist Interventions: Defined trauma.  Provided examples that qualify as traumatic events.  Discussed why people who experience trauma tend to avoid talking about those experiences.  Reviewed some common myths about trauma.  Emphasized that it is normal to have a variety of difficulties after experiencing trauma.  Challenged patient to identify some of the things she has been doing to cope despite those difficulties.     Summary:  Noted that sometimes she would like to talk about the loss of her daughter with her other daughter, Ashley Gomez, but usually Ashley Gomez shuts down the conversation.  Ashley Gomez said, "She only wants to talk about it when she wants to talk about it."   Identified a few things she has been doing since the loss to cope: Spending as much time with her grandchildren as possible, staying focused on tasks involved in taking care of the house, coming to therapy, and taking medication.   No change in depressive symptoms at this time.  Noted that she will be switching to a different antidepressant soon.       Plan: Will review symptoms of PTSD.  Next session is scheduled in 1-2 weeks.  Diagnosis:Persistent Depressive Disorder   PTSD    [name] 10/15/2014

## 2014-10-27 NOTE — Telephone Encounter (Signed)
Pt stated that Dr. Hedy Jacob is having her to cut her Viibryd in half and would like for her to start on Abilify or Lithium. She also mentioned that since her bp has been doing well should she continue on bp meds and there was a suggestion about d/c'ing her thyroid med.  Pt also wanted to know what is going to be done about her pain medication whether or not it will be switched. . She is requesting refills on her valium and hydrocodone. Will fwd to pcp for advice.Ashley Gomez, Ashley Gomez

## 2014-10-28 NOTE — Telephone Encounter (Signed)
Ok to stop the cytomel (thyroid med). It was for mood.  Ok to cut the losartan in half. Have BPs been low?

## 2014-10-30 ENCOUNTER — Ambulatory Visit (INDEPENDENT_AMBULATORY_CARE_PROVIDER_SITE_OTHER): Payer: Self-pay | Admitting: Family Medicine

## 2014-10-30 ENCOUNTER — Encounter: Payer: Self-pay | Admitting: Family Medicine

## 2014-10-30 DIAGNOSIS — Z0289 Encounter for other administrative examinations: Secondary | ICD-10-CM

## 2014-10-30 NOTE — Progress Notes (Signed)
No show 10/30/2014

## 2014-11-03 ENCOUNTER — Ambulatory Visit (HOSPITAL_COMMUNITY): Payer: Self-pay | Admitting: Licensed Clinical Social Worker

## 2014-11-04 ENCOUNTER — Encounter: Payer: Self-pay | Admitting: Family Medicine

## 2014-11-04 ENCOUNTER — Other Ambulatory Visit: Payer: Self-pay | Admitting: *Deleted

## 2014-11-04 ENCOUNTER — Ambulatory Visit (INDEPENDENT_AMBULATORY_CARE_PROVIDER_SITE_OTHER): Payer: No Typology Code available for payment source | Admitting: Family Medicine

## 2014-11-04 DIAGNOSIS — J329 Chronic sinusitis, unspecified: Secondary | ICD-10-CM

## 2014-11-04 DIAGNOSIS — B9689 Other specified bacterial agents as the cause of diseases classified elsewhere: Secondary | ICD-10-CM

## 2014-11-04 DIAGNOSIS — E871 Hypo-osmolality and hyponatremia: Secondary | ICD-10-CM

## 2014-11-04 DIAGNOSIS — A499 Bacterial infection, unspecified: Secondary | ICD-10-CM

## 2014-11-04 DIAGNOSIS — R110 Nausea: Secondary | ICD-10-CM

## 2014-11-04 DIAGNOSIS — E876 Hypokalemia: Secondary | ICD-10-CM

## 2014-11-04 MED ORDER — LOSARTAN POTASSIUM 50 MG PO TABS: ORAL_TABLET | ORAL | Status: DC

## 2014-11-04 MED ORDER — SUMATRIPTAN SUCCINATE 100 MG PO TABS: 100.0000 mg | ORAL_TABLET | ORAL | Status: DC | PRN

## 2014-11-04 MED ORDER — HYDROCHLOROTHIAZIDE 25 MG PO TABS: ORAL_TABLET | ORAL | Status: DC

## 2014-11-04 MED ORDER — HYDROCODONE-ACETAMINOPHEN 7.5-325 MG PO TABS: ORAL_TABLET | ORAL | Status: DC

## 2014-11-04 MED ORDER — AMOXICILLIN-POT CLAVULANATE 500-125 MG PO TABS: ORAL_TABLET | ORAL | Status: AC

## 2014-11-04 MED ORDER — PROCHLORPERAZINE MALEATE 5 MG PO TABS: 5.0000 mg | ORAL_TABLET | Freq: Four times a day (QID) | ORAL | Status: DC | PRN

## 2014-11-04 MED ORDER — DIAZEPAM 10 MG PO TABS: 5.0000 mg | ORAL_TABLET | Freq: Two times a day (BID) | ORAL | Status: DC | PRN

## 2014-11-04 MED ORDER — HYDROCODONE-ACETAMINOPHEN 7.5-325 MG PO TABS: 1.0000 | ORAL_TABLET | Freq: Three times a day (TID) | ORAL | Status: DC | PRN
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
## 2014-11-04 NOTE — Progress Notes (Signed)
CC: Ashley Gomez is a 44 y.o. female is here for hospital f/u   Subjective: HPI:  Follow-up emergency room visit Thursday of last week. She describes around 10:00 that morning feeling a abrupt onset of chills. This was soon accompanied by intense nausea worsened by the sight of food or thinking about eating. Within a few R she developed watery diarrhea . She felt lightheaded with a headache and presented to a local emergency room.  She was initially given a liter of fluid, and injection of Reglan, Toradol/Decadron/Benadryl IV for headache. Labs were obtained showing a mild hypocalcemia, mild hypokalemia, mild hyponatremia.  After the above medication she was able to leave the emergency room feeling somewhat better. She tells me that she has not regained in appetite and has only tried eating a hotdog since she left the emergency room on Thursday. Nothing seems to appeal to her taste right now. She is doing okay with fluids but it does seem to stir up her nausea to it mild degree. Zofran does not seem to be helping with preventing nausea. She is under the impression that she is not allowed to take Phenergan under any circumstances per her psychiatrist.  She feels fatigued and the last week at home and in bed. She denies any diarrhea since the day of her visit to the emergency room. There has been no vomiting.  She denies fever but does have night sweats every night.  Review of systems positive for cough that has been present ever since the day of her presentation to the emergency room. It is also accompanied by postnasal drip and forehead pressure just above and between the eyes. Reports thick nasal congestion that has been persistent for the past week as well.  She reports some abdominal discomfort but admits it's hard to localize. She denies any right upper quadrant pain or radiation into the right shoulder. She denies any dysuria or urinary frequency.   Review Of Systems Outlined In HPI  Past  Medical History  Diagnosis Date  . Hypertension   . Depression   . Anxiety   . Seizures   . MS (multiple sclerosis)   . Obesity   . Smoking   . Migraines   . Facet hypertrophy of lumbar region     MRI 2007  . DVT (deep venous thrombosis) 32/67/1245    L basilic vein   . CVA (cerebral infarction)     Old CVA on MRI brain    Past Surgical History  Procedure Laterality Date  . Tubal ligation  1992  . Tonsillectomy     Family History  Problem Relation Age of Onset  . Cancer Mother 73    melanoma  . Hypertension Sister   . Heart disease Sister     valve disease  . Depression Sister   . Diabetes Sister   . Sjogren's syndrome Sister     History   Social History  . Marital Status: Single    Spouse Name: N/A  . Number of Children: N/A  . Years of Education: N/A   Occupational History  . Not on file.   Social History Main Topics  . Smoking status: Current Every Day Smoker -- 1.00 packs/day for 25 years    Types: Cigarettes  . Smokeless tobacco: Never Used     Comment: advised to d/c by 3 cigs per week.  . Alcohol Use: 0.0 oz/week    0 Standard drinks or equivalent per week     Comment: occasional  .  Drug Use: No  . Sexual Activity: No   Other Topics Concern  . Not on file   Social History Narrative     Objective: BP 104/76 mmHg  Pulse 74  Temp(Src) 97.5 F (36.4 C) (Oral)  Wt 223 lb (101.152 kg)  General: Alert and Oriented, No Acute Distress HEENT: Pupils equal, round, reactive to light. Conjunctivae clear.  Moist mucous membranes pharynx unremarkable. Scant nasal discharge. Lungs: Clear to auscultation bilaterally, no wheezing/ronchi/rales.  Comfortable work of breathing. Good air movement. Cardiac: Regular rate and rhythm. Normal S1/S2.  No murmurs, rubs, nor gallops.   Abdomen: Obese and soft Extremities: No peripheral edema.  Strong peripheral pulses.  Mental Status: No depression, anxiety, nor agitation. Requires frequent redirection to chief  complaints. Skin: Warm and dry.  Assessment & Plan: Ashley Gomez was seen today for hospital f/u.  Diagnoses and all orders for this visit:  Hypocalcemia Orders: -     COMPLETE METABOLIC PANEL WITH GFR  Hyponatremia Orders: -     COMPLETE METABOLIC PANEL WITH GFR  Hypokalemia Orders: -     COMPLETE METABOLIC PANEL WITH GFR  Nausea without vomiting Orders: -     COMPLETE METABOLIC PANEL WITH GFR -     prochlorperazine (COMPAZINE) 5 MG tablet; Take 1 tablet (5 mg total) by mouth every 6 (six) hours as needed for nausea or vomiting.  Bacterial sinusitis Orders: -     amoxicillin-clavulanate (AUGMENTIN) 500-125 MG per tablet; Take one by mouth every 8 hours for ten total days.   Hypocalcemia, hyponatremia, hypokalemia,: Due for repeat complete metabolic panel to determine if this was due to dehydration, dilutional after her bag of fluid, or a new issue requiring further evaluation. Bacterial sinusitis: Start Augmentin consider nasal saline washes Nausea: Checking hepatic function panel and renal function, trial of Compazine. I would like to give her Reglan since she had a good response with this in the hospital however the tube is anticipated price will be unreasonable with her current insurance situation  Return if symptoms worsen or fail to improve.   40 minutes spent face-to-face during visit today of which at least 50% was counseling or coordinating care regarding: 1. Hypocalcemia   2. Hyponatremia   3. Hypokalemia   4. Nausea without vomiting   5. Bacterial sinusitis    in reviewing outside records in the presence of the patient.

## 2014-11-05 ENCOUNTER — Telehealth: Payer: Self-pay | Admitting: Family Medicine

## 2014-11-05 DIAGNOSIS — E876 Hypokalemia: Secondary | ICD-10-CM

## 2014-11-05 LAB — COMPLETE METABOLIC PANEL WITH GFR
ALT: 13 U/L (ref 0–35)
AST: 23 U/L (ref 0–37)
Albumin: 3.2 g/dL — ABNORMAL LOW (ref 3.5–5.2)
Alkaline Phosphatase: 81 U/L (ref 39–117)
BUN: 13 mg/dL (ref 6–23)
CO2: 25 mEq/L (ref 19–32)
Calcium: 8.5 mg/dL (ref 8.4–10.5)
Chloride: 103 mEq/L (ref 96–112)
Creat: 0.65 mg/dL (ref 0.50–1.10)
GFR, Est African American: >89 mL/min
GFR, Est Non African American: >89 mL/min
Glucose, Bld: 109 mg/dL — ABNORMAL HIGH (ref 70–99)
Potassium: 3.4 mEq/L — ABNORMAL LOW (ref 3.5–5.3)
Sodium: 141 mEq/L (ref 135–145)
Total Bilirubin: 0.4 mg/dL (ref 0.2–1.2)
Total Protein: 6.4 g/dL (ref 6.0–8.3)

## 2014-11-05 MED ORDER — POTASSIUM CHLORIDE CRYS ER 20 MEQ PO TBCR: 20.0000 meq | EXTENDED_RELEASE_TABLET | Freq: Every day | ORAL | Status: DC

## 2014-11-05 NOTE — Addendum Note (Signed)
Addended by: Teddy Spike on: 11/05/2014 04:22 PM   Modules accepted: Orders

## 2014-11-05 NOTE — Telephone Encounter (Signed)
Pt notified and transferred to schedule appt.

## 2014-11-05 NOTE — Telephone Encounter (Signed)
Follow up with Dr. Jerilynn Mages next week

## 2014-11-05 NOTE — Telephone Encounter (Signed)
Pt's K is low and Dr. Ileene Rubens sent over an Rx for her to take. I advised T. Pyrtle to call pt and inform her that she will need to go p/u med and that we will call her back to advise her about a f/u appt. Due to her needing a 30 min appt time. Will fwd to pcp for advice and call pt back with recommendations for f/u.Audelia Hives [place]

## 2014-11-05 NOTE — Telephone Encounter (Addendum)
Called pt and informed her that she will need to go have labs done the wk of 7/4 and scheduled an appt for her to be seen on 7/15. Labs faxed.Audelia Hives [place]

## 2014-11-05 NOTE — Telephone Encounter (Signed)
Dr. Gardiner Ramus next avail is first part of july

## 2014-11-05 NOTE — Telephone Encounter (Signed)
Follow up with me in 2 weeks. Go for BMP check in one week after starts potassium

## 2014-11-05 NOTE — Telephone Encounter (Signed)
Seth Bake, Will you please let patient know that her sodium and calcium level have improved.  Her potassium is still just barely deficient so I'd recommend she start a daily potassium supplement that I sent to [facility] in [place].  This might not be permanent once her appetitie picks back up.  I also forgot to recommend the BRAT diet until her appetitie picks back up, can you tell her more about this.

## 2014-11-09 ENCOUNTER — Ambulatory Visit (INDEPENDENT_AMBULATORY_CARE_PROVIDER_SITE_OTHER): Payer: Self-pay | Admitting: Licensed Clinical Social Worker

## 2014-11-09 ENCOUNTER — Encounter: Payer: Self-pay | Admitting: Sports Medicine

## 2014-11-09 ENCOUNTER — Ambulatory Visit (INDEPENDENT_AMBULATORY_CARE_PROVIDER_SITE_OTHER): Payer: No Typology Code available for payment source | Admitting: Sports Medicine

## 2014-11-09 VITALS — BP 113/73 | HR 71 | Wt 230.0 lb

## 2014-11-09 DIAGNOSIS — F4310 Post-traumatic stress disorder, unspecified: Secondary | ICD-10-CM

## 2014-11-09 DIAGNOSIS — F341 Dysthymic disorder: Secondary | ICD-10-CM

## 2014-11-09 DIAGNOSIS — F328 Other depressive episodes: Secondary | ICD-10-CM

## 2014-11-09 DIAGNOSIS — M7918 Myalgia, other site: Secondary | ICD-10-CM | POA: Insufficient documentation

## 2014-11-09 DIAGNOSIS — M791 Myalgia: Secondary | ICD-10-CM

## 2014-11-09 DIAGNOSIS — M2241 Chondromalacia patellae, right knee: Secondary | ICD-10-CM

## 2014-11-09 MED ORDER — MAGNESIUM OXIDE 400 MG PO TABS: 800.0000 mg | ORAL_TABLET | Freq: Every day | ORAL | Status: DC

## 2014-11-09 MED ORDER — PREGABALIN 75 MG PO CAPS
75.0000 mg | ORAL_CAPSULE | Freq: Two times a day (BID) | ORAL | Status: DC
Start: 2014-11-09 — End: 2014-11-19

## 2014-11-09 NOTE — Progress Notes (Signed)
  Subjective:    CC: Followup  HPI: Knee pain: Persistent, failed steroid injection, physical therapy, viscosupplementation. She did go to [facility], they did a trochanteric bursa injection which did not provide any improvement, not even temporarily.  Myofascial pain: The lack of response, even temporary to the trochanteric bursa injection by  orthopedic surgery as above indicates either the injection missed the target, or that there is another diagnosis. On further questioning she does have widespread neck, shoulder, hip, back pain, thigh pain, this is been present for a long time, this is combined with her severe, uncontrolled depression.  Dehydration: Was noted to be hyponatremic and hypokalemic in the emergency department, was repleted, more recently her potassium levels were still slightly low, she is taking potassium tablets but is getting some cramping in her calves.  Past medical history, Surgical history, Family history not pertinant except as noted below, Social history, Allergies, and medications have been entered into the medical record, reviewed, and no changes needed.   Review of Systems: No fevers, chills, night sweats, weight loss, chest pain, or shortness of breath.   Objective:    General: Well Developed, well nourished, and in no acute distress.  Neuro: Alert and oriented x3, extra-ocular muscles intact, sensation grossly intact.  HEENT: Normocephalic, atraumatic, pupils equal round reactive to light, neck supple, no masses, no lymphadenopathy, thyroid nonpalpable.  Skin: Warm and dry, no rashes. tender to palpation throughout on muscles. Cardiac: Regular rate and rhythm, no murmurs rubs or gallops, no lower extremity edema.  Respiratory: Clear to auscultation bilaterally. Not using accessory muscles, speaking in full sentences.  Impression and Recommendations:

## 2014-11-09 NOTE — Assessment & Plan Note (Signed)
No response, not even temporary to a trochanteric bursa injection by her orthopedic surgeon. She is fairly tender throughout, on both shoulders, upper arms, hips, thighs, and back, lack of response to the trochanteric bursa injection raises the likelihood of myofascial pain syndrome/fibromyalgia. I am going to add some Lyrica to use as needed. Return in one month.

## 2014-11-09 NOTE — Assessment & Plan Note (Signed)
Persistent knee pain despite steroidal injection, viscous supplementation, MRI does show some degenerative fraying of the menisci, as well as grade 4 chondromalacia in several areas of the patellofemoral joint. She does need to go back to Dr. Erlinda Hong , she is a candidate for arthroscopy. I will call him.

## 2014-11-10 NOTE — Progress Notes (Signed)
THERAPIST PROGRESS NOTE  Session Time: 4:00pm-5:00pm  Participation Level: Active  Behavioral Response: Casual Alert Anxious and Tearful  Type of Therapy: Individual Therapy  Treatment Goals addressed: depression  Interventions: psycho-education   Suicidal/Homicidal: some vague SI without intent, denies HI  Therapist Interventions: Had patient complete a PHQ-9 to assess for severity of depressive symptoms. Educated patient about symptoms of PTSD.  Had her complete a PCL-C to assess for current severity of PTSD symptoms.  Explained how the disorder involves re-experiencing a traumatic event or events in some way, changes to your thinking about self and others, changes in your ability to regulate your emotions, and changes in bodily arousal.     Provided patient with a copy of a workbook chapter to read for homework called "Understanding Trauma."  Summary:  PHQ-9 score was 24 (severe).     PCL-C score was 77.  She rated most items on the questionnaire as being extremely bothersome.  She talked about how in a lot of ways she feels like a different person compared to how she was before the unexpected loss of her daughter.  She also talked about how friends have distanced themselves from her.  Defined her "safe place" as at home sitting at the kitchen table with doors locked and blinds closed.  Indicated that she spends a lot of time sitting there.  Generally doesn't watch TV because she can't focus.  Generally doesn't listen to music because it brings up too many memories of her daughter.    Agreed to read for homework.       Plan: Scheduled to return in one to two weeks.  Will introduce mindfulness.  Diagnosis:Persistent Depressive Disorder   PTSD    [name] 11/10/2014

## 2014-11-11 ENCOUNTER — Encounter (HOSPITAL_COMMUNITY): Payer: Self-pay | Admitting: Physician Assistant

## 2014-11-11 ENCOUNTER — Ambulatory Visit (INDEPENDENT_AMBULATORY_CARE_PROVIDER_SITE_OTHER): Payer: Self-pay | Admitting: Physician Assistant

## 2014-11-11 VITALS — BP 107/83 | HR 70 | Wt 226.5 lb

## 2014-11-11 DIAGNOSIS — F331 Major depressive disorder, recurrent, moderate: Secondary | ICD-10-CM

## 2014-11-11 DIAGNOSIS — F329 Major depressive disorder, single episode, unspecified: Secondary | ICD-10-CM

## 2014-11-11 DIAGNOSIS — F4310 Post-traumatic stress disorder, unspecified: Secondary | ICD-10-CM

## 2014-11-11 DIAGNOSIS — F411 Generalized anxiety disorder: Secondary | ICD-10-CM

## 2014-11-11 MED ORDER — ARIPIPRAZOLE 5 MG PO TABS: ORAL_TABLET | ORAL | Status: DC

## 2014-11-11 NOTE — Patient Instructions (Signed)
Decrease your Viibryd to 10mg  a day with a pill cutter as discussed. Take 10mg  a day for 4 days then stop. Continue taking the rest of your medications as ordered. Try to use yogurt to help with your digestive issues. Follow up in 2 weeks.

## 2014-11-11 NOTE — Progress Notes (Signed)
Patient ID: Ashley Gomez, female   DOB: 1970-09-09, 44 y.o.   MRN: [id number] [facility] MD Progress Note  11/11/2014 9:23 AM Ashley Gomez  MRN:  [id number] Subjective:  Ashley Gomez is in to follow up on her depression, anxiety and complicated grief. She is in to complete the forms for patient assistance for Aripiprazole. She has not been started yet.  She was recently in the ED for dehydration in the interim. But she is feeling much better now. She is not eating well yet. She is decreasing her Viibryd to 20mg  and our goal is to D/C it.  Will hope to Add Abilify as adjunctive treatment for her depression as a good mood stabilizer for her. But she will need to complete the Patient assist application which we are doing this AM.  Principal Problem: MDD, PTSD, GAD and complicated grief. Diagnosis:   Patient Active Problem List   Diagnosis Date Noted  . Myofascial pain [M79.1] 11/09/2014  . Dysthymic disorder [F34.1] 10/15/2014  . PTSD (post-traumatic stress disorder) [F43.10] 10/15/2014  . Chronic knee pain [M25.569, G89.29] 08/27/2014  . Superficial thrombophlebitis [I80.9] 09/15/2013  . Menopausal and perimenopausal disorder [N95.9] 10/04/2012  . Chondromalacia of patellofemoral joint [M22.40] 12/21/2011  . Lateral epicondylitis of right elbow [M77.11] 10/18/2010  . B12 DEFICIENCY [E53.8] 06/21/2010  . UNSPECIFIED ANEMIA [D64.9] 06/21/2010  . NECK PAIN, CHRONIC [M54.2] 06/21/2010  . VARICOSE VEINS, LOWER EXTREMITIES [I83.90] 05/17/2010  . ABSENCE OF MENSTRUATION [N91.2] 05/17/2010  . POSTURAL LIGHTHEADEDNESS [R42] 05/17/2010  . NUMBNESS, ARM [R20.9] 04/04/2010  . DVT [I82.409] 01/13/2010  . SEIZURE DISORDER [R56.9] 01/13/2010  . IRREGULAR MENSES [N92.6] 06/01/2009  . Depression, major, recurrent, moderate [F33.1] 05/16/2009  . OBESITY, UNSPECIFIED [E66.9] 03/09/2009  . Anxiety state [F41.1] 03/09/2009  . CIGARETTE SMOKER [Z72.0] 03/09/2009  . MULTIPLE SCLEROSIS, RELAPSING/REMITTING [G35] 03/09/2009   . MIGRAINE WITHOUT AURA [Z66.063] 03/09/2009  . ESSENTIAL HYPERTENSION, BENIGN [I10] 03/09/2009  . SCIATICA, CHRONIC [M54.30] 03/09/2009   Total Time spent with patient: 30 minutes   Past Medical History:  Past Medical History  Diagnosis Date  . Hypertension   . Depression   . Anxiety   . Seizures   . MS (multiple sclerosis)   . Obesity   . Smoking   . Migraines   . Facet hypertrophy of lumbar region     MRI 2007  . DVT (deep venous thrombosis) 01/60/1093    L basilic vein   . CVA (cerebral infarction)     Old CVA on MRI brain    Past Surgical History  Procedure Laterality Date  . Tubal ligation  1992  . Tonsillectomy     Family History:  Family History  Problem Relation Age of Onset  . Cancer Mother 72    melanoma  . Hypertension Sister   . Heart disease Sister     valve disease  . Depression Sister   . Diabetes Sister   . Sjogren's syndrome Sister    Social History:  History  Alcohol Use  . 0.0 oz/week  . 0 Standard drinks or equivalent per week    Comment: occasional     History  Drug Use No    History   Social History  . Marital Status: Single    Spouse Name: N/A  . Number of Children: N/A  . Years of Education: N/A   Social History Main Topics  . Smoking status: Current Every Day Smoker -- 1.00 packs/day for 25 years    Types: Cigarettes  . Smokeless tobacco:  Never Used     Comment: advised to d/c by 3 cigs per week.  . Alcohol Use: 0.0 oz/week    0 Standard drinks or equivalent per week     Comment: occasional  . Drug Use: No  . Sexual Activity: No   Other Topics Concern  . None   Social History Narrative   Additional History:    Sleep: fair  Appetite:  Poor   Assessment: Patient is continuing to improve but slowly. Will hope to D/C much of her medication as symptoms decrease.  Musculoskeletal: Strength & Muscle Tone: within normal limits Gait & Station: normal Patient leans: Normal   Psychiatric Specialty  Exam: Physical Exam  ROS  Blood pressure 107/83, pulse 70, weight 226 lb 8 oz (102.74 kg).Body mass index is 36.58 kg/(m^2).  General Appearance: Casual  Eye Contact::  Good  Speech:  Clear and Coherent  Volume:  Normal  Mood:  depressed  Affect: congruent, NO tears today!  Thought Process:  Goal Directed  Orientation:  Full (Time, Place, and Person)  Thought Content:  WDL  Suicidal Thoughts:  No  Homicidal Thoughts:  No  Memory:  Immediate;   Fair Recent;   Fair Remote;   Fair  Judgement:  Intact  Insight:  Present  Psychomotor Activity:  Normal  Concentration:  Fair  Recall:  [employer] of Knowledge:Fair  Language: Fair  Akathisia:  No  Handed:  Right  AIMS (if indicated):     Assets:  Communication Skills Desire for Improvement  ADL's:  Intact  Cognition: WNL  Sleep:  4-6 hours     Current Medications: Current Outpatient Prescriptions  Medication Sig Dispense Refill  . buPROPion (WELLBUTRIN XL) 150 MG 24 hr tablet Take 1 tablet (150 mg total) by mouth every morning. 90 tablet 0  . cyanocobalamin 1000 MCG tablet Take 100 mcg by mouth daily.    . diazepam (VALIUM) 10 MG tablet Take 0.5-1 tablets (5-10 mg total) by mouth every 12 (twelve) hours as needed for anxiety. 60 tablet 0  . ergocalciferol (VITAMIN D2) 50000 UNITS capsule Take 50,000 Units by mouth once a week.    . gabapentin (NEURONTIN) 300 MG capsule Take 1 capsule (300 mg total) by mouth 3 (three) times daily. 90 capsule 2  . hydrochlorothiazide (HYDRODIURIL) 25 MG tablet TAKE ONE TABLET BY MOUTH DAILY AS NEEDED 30 tablet 1  . HYDROcodone-acetaminophen (NORCO) 7.5-325 MG per tablet Take 1 tablet by mouth every 8 (eight) hours as needed. for pain  0  . SUMAtriptan (IMITREX) 100 MG tablet Take 1 tablet (100 mg total) by mouth every 2 (two) hours as needed for migraine. No more than 2 in 24 hours. 10 tablet 6  . topiramate (TOPAMAX) 100 MG tablet Take one tablet by mouth in the AM and two tablets in the evening  for mood stabilization a. 90 tablet 0  . traZODone (DESYREL) 50 MG tablet Take 50 mg by mouth at bedtime as needed for sleep.    . Vilazodone HCl (VIIBRYD) 40 MG TABS Take 1 tablet (40 mg total) by mouth daily. 90 tablet 3  . diphenoxylate-atropine (LOMOTIL) 2.5-0.025 MG per tablet Take 1 tablet by mouth 4 (four) times daily as needed for diarrhea or loose stools. (Patient not taking: Reported on 10/22/2014) 30 tablet 0   No current facility-administered medications for this visit.    Lab Results: No results found for this or any previous visit (from the past 48 hour(s)).  Physical Findings: AIMS:  CIWA:   COWS:    Treatment Plan Summary: Medication management   Medical Decision Making:  Review of New Medication or Change in Dosage (2) and Review or Order of Psychological Test(s) (1)  1. Patient will continue Welbutrin 150mg  and re-educated to take early in the AM by 8AM. 2. Patient will get a pill cutter to cut her 20mg  in half to 10mg  for 7 days then stop. 3. Patient has completed the Abilify paperwork and this will be faxed in to the company with a prescription for 5mg  of Abilify to take once a day. 4. She will follow up 2 weeks. 5. She will practice thought stopping as well as her breathing exercises 4 x a day as instructed. >50% of this visit was spent in education and counseling and completion of paperwork for patient Assist. Milta Deiters T. Brittini Brubeck RPAC 9:23 AM 11/11/2014

## 2014-11-12 NOTE — Telephone Encounter (Signed)
Pt was seen in clinic on 6/29 by Nena Polio, [place]. Prescription was written for Abilify was sent to [email or web address].  Pt has a f/u appt on 7/12.

## 2014-11-13 ENCOUNTER — Telehealth (HOSPITAL_COMMUNITY): Payer: Self-pay | Admitting: *Deleted

## 2014-11-13 NOTE — Telephone Encounter (Signed)
Pt called to check to status for Abilify prescription from [facility].  Called and informed pt prescription was fax on 6/29 and will notify once a response is given to office. Pt states and shows understanding.

## 2014-11-17 ENCOUNTER — Telehealth: Payer: Self-pay | Admitting: Sports Medicine

## 2014-11-17 DIAGNOSIS — M7918 Myalgia, other site: Secondary | ICD-10-CM

## 2014-11-17 NOTE — Telephone Encounter (Signed)
Pt was given an Rx for Lyrica at last visit, the Rx was written for a quantity #14. Pt as given a discount card to use to help with cost, discount card will not work unless the Rx is for a month supply. Will route to Dr. Darene Lamer for review.

## 2014-11-19 MED ORDER — PREGABALIN 75 MG PO CAPS: 75.0000 mg | ORAL_CAPSULE | Freq: Two times a day (BID) | ORAL | Status: DC

## 2014-11-20 ENCOUNTER — Other Ambulatory Visit (HOSPITAL_COMMUNITY): Payer: Self-pay | Admitting: *Deleted

## 2014-11-24 ENCOUNTER — Ambulatory Visit (INDEPENDENT_AMBULATORY_CARE_PROVIDER_SITE_OTHER): Payer: No Typology Code available for payment source | Admitting: Licensed Clinical Social Worker

## 2014-11-24 ENCOUNTER — Encounter (HOSPITAL_COMMUNITY): Payer: Self-pay | Admitting: Physician Assistant

## 2014-11-24 ENCOUNTER — Ambulatory Visit (HOSPITAL_COMMUNITY): Payer: Self-pay | Admitting: Physician Assistant

## 2014-11-24 ENCOUNTER — Ambulatory Visit (HOSPITAL_COMMUNITY): Payer: No Typology Code available for payment source | Admitting: Physician Assistant

## 2014-11-24 VITALS — BP 126/70 | HR 82 | Ht 66.0 in | Wt 226.0 lb

## 2014-11-24 DIAGNOSIS — F4310 Post-traumatic stress disorder, unspecified: Secondary | ICD-10-CM

## 2014-11-24 DIAGNOSIS — F341 Dysthymic disorder: Secondary | ICD-10-CM

## 2014-11-24 NOTE — Progress Notes (Signed)
THERAPIST PROGRESS NOTE  Session Time: 2:00pm- 3:00pm  Participation Level: Active  Behavioral Response: Casual Alert Anxious and Tearful  Type of Therapy: Individual Therapy  Treatment Goals addressed: depression  Interventions: assertiveness training   Suicidal/Homicidal: some vague SI without intent, denies HI  Therapist Interventions:  Explored patient's relationship with her daughter.  Suggested that her daughter may also be dealing with PTSD type symptoms.  Discussed how she can respond to disrespectful behavior.      Summary:  Indicated that relationship with her daughter is strained.  Reported that in general her daughter seems unhappy.  She often says hurtful things to patient and yells at the kids.  She avoids talking about how she feels and about the death of her sister.  Patient expresses intentions of moving into her own home if she is awarded disability because she does not want to continue living in a place where she feels disrespected and unappreciated.  Reports when she feels disrespected she will let her daughter know she will not tolerate being spoken to in that way and she will retreat to her bedroom or outside to get away from her daughter's negativity.  Indicated this works fairly well in the moment, but has not seemed to work as far as getting her daughter to stop acting out.    Depressive symptoms have not improved since last seen.  Reported waking up frequently at night from upsetting dreams.      Plan: Scheduled to return in one to two weeks. Will review material from workbook chapter "Understanding Trauma."  May introduce mindfulness if there is time.  Diagnosis:Persistent Depressive Disorder   PTSD   Ashley Gomez 11/24/2014

## 2014-11-25 NOTE — Progress Notes (Signed)
Visit cancelled and rescheduled.

## 2014-11-27 ENCOUNTER — Encounter: Payer: Self-pay | Admitting: Family Medicine

## 2014-11-27 ENCOUNTER — Ambulatory Visit (INDEPENDENT_AMBULATORY_CARE_PROVIDER_SITE_OTHER): Payer: No Typology Code available for payment source | Admitting: Family Medicine

## 2014-11-27 VITALS — BP 134/78 | HR 95 | Ht 66.0 in | Wt 226.0 lb

## 2014-11-27 DIAGNOSIS — M2241 Chondromalacia patellae, right knee: Secondary | ICD-10-CM

## 2014-11-27 DIAGNOSIS — F4310 Post-traumatic stress disorder, unspecified: Secondary | ICD-10-CM

## 2014-11-27 DIAGNOSIS — F411 Generalized anxiety disorder: Secondary | ICD-10-CM

## 2014-11-27 DIAGNOSIS — M7918 Myalgia, other site: Secondary | ICD-10-CM

## 2014-11-27 DIAGNOSIS — M791 Myalgia: Secondary | ICD-10-CM

## 2014-11-27 DIAGNOSIS — M5430 Sciatica, unspecified side: Secondary | ICD-10-CM

## 2014-11-27 DIAGNOSIS — E876 Hypokalemia: Secondary | ICD-10-CM

## 2014-11-27 DIAGNOSIS — Z13220 Encounter for screening for lipoid disorders: Secondary | ICD-10-CM

## 2014-11-27 MED ORDER — DIAZEPAM 10 MG PO TABS: 5.0000 mg | ORAL_TABLET | Freq: Two times a day (BID) | ORAL | Status: DC | PRN

## 2014-11-27 MED ORDER — HYDROCODONE-ACETAMINOPHEN 7.5-325 MG PO TABS: ORAL_TABLET | ORAL | Status: DC

## 2014-11-27 NOTE — Progress Notes (Signed)
   Subjective:    Patient ID: Ashley Gomez, female    DOB: [birth year], 44 y.o.   MRN: [id number]  HPI She was recently diagnosed with trochanteric bursitis. Dr. Dianah Field her sports medicine doctor had recommended that she start Lyrica. But her behavioral health specialist recommended that she not take it with the Abilify. He can increase the risk of CNS depression. We will try to send a note back to Dr. Dianah Field see if he may want to choose a different medication. We also write her chronic pain medications as Dr. Dianah Field had released that 2 of about 2 months ago. He felt like her mood was driving a lot of her pain levels have not recommended a increased dose with her regimen. She has lost about 4 lbs. She is currently on Abilify and Wellbutrin or PTSD, depression and anxiety. She is seeing Judson Roch for counseling.    Low potassium - started on supplement on July . She was hospitalized after some type of gastroenteritis. Potassium remained low says she was started on a supplement. Still on potassium and magnesium. She is taking 32mEQ daily.      Chronic sciatica and myofascial pain-she would like a refill on her hydrocodone today. Her daughter usually pays for her medications that she does not work and her daughter is leaving for the beach. Says she wondered if she can get the prescription just a few days early to fill.  Review of Systems     Objective:   Physical Exam  Constitutional: She is oriented to person, place, and time. She appears well-developed and well-nourished.  HENT:  Head: Normocephalic and atraumatic.  Cardiovascular: Normal rate, regular rhythm and normal heart sounds.   Pulmonary/Chest: Effort normal and breath sounds normal.  Neurological: She is alert and oriented to person, place, and time.  Skin: Skin is warm and dry.  Psychiatric: She has a normal mood and affect. Her behavior is normal.          Assessment & Plan:  PTSD, Depression/Anxiety - following  with behavioral health.  She was just started on abilify about a week ago. Also on wellubrin. Encouraged her to get rid of the old [place].    chronic sciatica/Myofascial pain /Knee pain - will check with Dr. Darene Lamer since can't take the Lyrica.    WE WILL SEND A NOTE TO DR. THEKKEKANDAM TO SEE IF HE WANTS TO TRY SOMETHING ELSE. In the meantime I did go ahead and refill her hydrocodone. Once again discussed with her that we are sticking with the lower 7.5 mg dose for now until she gets her mood under better control as I do think that her mood is driving a lot of her pain currently.  Low potassium - due to recheck the levels.   We'll call results once available. Continue potassium for now.

## 2014-11-28 LAB — LIPID PANEL
Cholesterol: 229 mg/dL — ABNORMAL HIGH (ref 0–200)
HDL: 49 mg/dL (ref >=46)
LDL Cholesterol: 159 mg/dL — ABNORMAL HIGH (ref 0–99)
Total CHOL/HDL Ratio: 4.7 Ratio
Triglycerides: 106 mg/dL (ref <150)
VLDL: 21 mg/dL (ref 0–40)

## 2014-11-28 LAB — BASIC METABOLIC PANEL WITH GFR
BUN: 9 mg/dL (ref 6–23)
CO2: 27 meq/L (ref 19–32)
Calcium: 9.4 mg/dL (ref 8.4–10.5)
Chloride: 107 meq/L (ref 96–112)
Creat: 0.84 mg/dL (ref 0.50–1.10)
Glucose, Bld: 85 mg/dL (ref 70–99)
Potassium: 4.5 meq/L (ref 3.5–5.3)
Sodium: 142 meq/L (ref 135–145)

## 2014-12-01 ENCOUNTER — Other Ambulatory Visit (HOSPITAL_COMMUNITY): Payer: Self-pay | Admitting: Physician Assistant

## 2014-12-01 ENCOUNTER — Telehealth: Payer: Self-pay | Admitting: Family Medicine

## 2014-12-01 ENCOUNTER — Ambulatory Visit (INDEPENDENT_AMBULATORY_CARE_PROVIDER_SITE_OTHER): Payer: No Typology Code available for payment source | Admitting: Licensed Clinical Social Worker

## 2014-12-01 DIAGNOSIS — M25562 Pain in left knee: Secondary | ICD-10-CM

## 2014-12-01 DIAGNOSIS — F4310 Post-traumatic stress disorder, unspecified: Secondary | ICD-10-CM

## 2014-12-01 DIAGNOSIS — M25552 Pain in left hip: Secondary | ICD-10-CM

## 2014-12-01 DIAGNOSIS — M7918 Myalgia, other site: Secondary | ICD-10-CM

## 2014-12-01 DIAGNOSIS — F341 Dysthymic disorder: Secondary | ICD-10-CM

## 2014-12-01 NOTE — Telephone Encounter (Signed)
That leaves gabapentin, I have sent this in before, she needs to continue up taper to the max. 2 pills 3 times a day for a week then 3 pills 3 times a day for a week then 4 pills 3 times a day for a week if tolerable

## 2014-12-01 NOTE — Telephone Encounter (Signed)
Dr. Darene Lamer-   Her lyrica is contraindicated with her abilify.  Psych requesting another rec in place of Lyrica.   Dr. Jerilynn Mages

## 2014-12-01 NOTE — Telephone Encounter (Signed)
She is already on several sedative drugs. It is not safe to add a muscle relaxer.  We could try PT.  We could see if covered with her inurance.

## 2014-12-01 NOTE — Progress Notes (Signed)
THERAPIST PROGRESS NOTE  Session Time: 10:00am-11:05am  Participation Level: Active  Behavioral Response: Casual Alert Anxious and Tearful  Type of Therapy: Individual Therapy  Treatment Goals addressed: depression  Interventions: assertiveness training   Suicidal/Homicidal: some SI without plan or intent, denies HI  Therapist Interventions:  Had patient complete a PHQ-9 to assess for depressive symptoms. Discussed how patient confronted her daughter about treating her in a disrespectful manner.   Praised patient for expressing her feelings assertively.  Pointed out that even though she did not get the response she would have preferred she can choose to be proud of herself for speaking her mind.      Summary:  PHQ-9 score was 25 (severe).  Symptoms reported included hopelessness, insomnia, fatigue, poor appetite, feelings of worthlessness, trouble concentrating, psychomotor retardation, and suicidal ideation without plan or intent.    Reported that she had a "discussion" with her daughter on Friday.  Communicated that she did not deserve to be yelled or cussed at.  Daughter responded by interrupting, yelling, and cussing.  Patient prompted her to consider whether or not she should apologize for her behavior.  Daughter said, "No, I don't owe you an apology."  Reported that daughter actually accused her of being disrespectful. Has decided to distance herself from her daughter to avoid further conflict.  Talked about how she feels like after her one daughter died she also lost her other daughter.            Plan: Scheduled to return next week.    Diagnosis:Persistent Depressive Disorder   PTSD   [name] 12/01/2014

## 2014-12-01 NOTE — Telephone Encounter (Signed)
Patient walked-in stated she spk w/ Amber and she was asking if there is a muscle relaxer that she can take that can be called in. Pls adv she req a nurse to call back and let her know asap-vew

## 2014-12-01 NOTE — Telephone Encounter (Signed)
Yes, please place referral for both.

## 2014-12-01 NOTE — Telephone Encounter (Signed)
Would you like referral to be placed to PT for Left Hip and Left Knee pain? She would like to go to the PT in our building.

## 2014-12-02 NOTE — Telephone Encounter (Signed)
Referral has been placed. 

## 2014-12-02 NOTE — Telephone Encounter (Signed)
Pt informed. She stated that she currently takes 300 mg tab and wants to know if this dosage is correct?Ashley Gomez, Lahoma Crocker

## 2014-12-03 ENCOUNTER — Ambulatory Visit (INDEPENDENT_AMBULATORY_CARE_PROVIDER_SITE_OTHER): Payer: No Typology Code available for payment source | Admitting: Medical

## 2014-12-03 VITALS — BP 137/89 | HR 88 | Resp 20 | Ht 66.0 in | Wt 226.0 lb

## 2014-12-03 DIAGNOSIS — F4329 Adjustment disorder with other symptoms: Secondary | ICD-10-CM

## 2014-12-03 DIAGNOSIS — F607 Dependent personality disorder: Secondary | ICD-10-CM | POA: Insufficient documentation

## 2014-12-03 DIAGNOSIS — F4321 Adjustment disorder with depressed mood: Secondary | ICD-10-CM

## 2014-12-03 DIAGNOSIS — G894 Chronic pain syndrome: Secondary | ICD-10-CM | POA: Insufficient documentation

## 2014-12-03 DIAGNOSIS — F411 Generalized anxiety disorder: Secondary | ICD-10-CM

## 2014-12-03 DIAGNOSIS — F4310 Post-traumatic stress disorder, unspecified: Secondary | ICD-10-CM

## 2014-12-03 DIAGNOSIS — F331 Major depressive disorder, recurrent, moderate: Secondary | ICD-10-CM

## 2014-12-03 DIAGNOSIS — Z634 Disappearance and death of family member: Secondary | ICD-10-CM

## 2014-12-03 DIAGNOSIS — F341 Dysthymic disorder: Secondary | ICD-10-CM

## 2014-12-03 DIAGNOSIS — G35 Multiple sclerosis: Secondary | ICD-10-CM

## 2014-12-03 MED ORDER — BUPROPION HCL ER (XL) 300 MG PO TB24: 300.0000 mg | ORAL_TABLET | ORAL | Status: DC
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
## 2014-12-03 NOTE — Telephone Encounter (Signed)
Yes but may continue the up titration of the dosage. May go up to a maximum of 3006 mg per day.

## 2014-12-04 NOTE — Telephone Encounter (Signed)
lvm informing pt of recommendations. .Ashley Gomez  

## 2014-12-08 ENCOUNTER — Ambulatory Visit (INDEPENDENT_AMBULATORY_CARE_PROVIDER_SITE_OTHER): Payer: No Typology Code available for payment source | Admitting: Sports Medicine

## 2014-12-08 ENCOUNTER — Ambulatory Visit (INDEPENDENT_AMBULATORY_CARE_PROVIDER_SITE_OTHER): Payer: No Typology Code available for payment source | Admitting: Family Medicine

## 2014-12-08 ENCOUNTER — Ambulatory Visit (INDEPENDENT_AMBULATORY_CARE_PROVIDER_SITE_OTHER): Payer: No Typology Code available for payment source | Admitting: Licensed Clinical Social Worker

## 2014-12-08 ENCOUNTER — Encounter: Payer: Self-pay | Admitting: Sports Medicine

## 2014-12-08 ENCOUNTER — Telehealth (HOSPITAL_COMMUNITY): Payer: Self-pay | Admitting: *Deleted

## 2014-12-08 VITALS — BP 137/89 | HR 99 | Wt 226.0 lb

## 2014-12-08 DIAGNOSIS — F331 Major depressive disorder, recurrent, moderate: Secondary | ICD-10-CM

## 2014-12-08 DIAGNOSIS — F4310 Post-traumatic stress disorder, unspecified: Secondary | ICD-10-CM

## 2014-12-08 DIAGNOSIS — F4329 Adjustment disorder with other symptoms: Secondary | ICD-10-CM

## 2014-12-08 DIAGNOSIS — Z634 Disappearance and death of family member: Secondary | ICD-10-CM

## 2014-12-08 DIAGNOSIS — M7918 Myalgia, other site: Secondary | ICD-10-CM

## 2014-12-08 DIAGNOSIS — F4321 Adjustment disorder with depressed mood: Secondary | ICD-10-CM

## 2014-12-08 DIAGNOSIS — F341 Dysthymic disorder: Secondary | ICD-10-CM

## 2014-12-08 DIAGNOSIS — M791 Myalgia: Secondary | ICD-10-CM

## 2014-12-08 DIAGNOSIS — M2242 Chondromalacia patellae, left knee: Secondary | ICD-10-CM

## 2014-12-08 DIAGNOSIS — I10 Essential (primary) hypertension: Secondary | ICD-10-CM

## 2014-12-08 MED ORDER — TOPIRAMATE 100 MG PO TABS: ORAL_TABLET | ORAL | Status: DC

## 2014-12-08 MED ORDER — CYCLOBENZAPRINE HCL 10 MG PO TABS: 10.0000 mg | ORAL_TABLET | Freq: Three times a day (TID) | ORAL | Status: DC

## 2014-12-08 NOTE — Telephone Encounter (Signed)
PT called for a refill for Topamax 100mg . Per Darlyne Russian, PA-C, pt is authorized for a refill for Topamax 100mg , qty 90. Prescription was sent to pharmacy. PT has a f/u appt on 12/17/14. Called and informed pt of prescription status. PT states and shows understanding.

## 2014-12-08 NOTE — Assessment & Plan Note (Signed)
Has still not seen the orthopedic surgeon for follow-up regarding her knee pain, she does need an arthroscopy.

## 2014-12-08 NOTE — Progress Notes (Signed)
   Subjective:    Patient ID: Ashley Gomez, female    DOB: 12/20/1970, 44 y.o.   MRN: [id number]  HPI  Review of Systems     Objective:   Physical Exam        Assessment & Plan:  Patient really didn't need to be seen today. F//U schedule for September.  Beatrice Lecher, MD

## 2014-12-08 NOTE — Assessment & Plan Note (Signed)
Considering persistent depressive symptoms I would recommend going up to 10 mg of Abilify, she can take 2 of her current 5 mg pills until she runs out, but should also request a refill.

## 2014-12-08 NOTE — Progress Notes (Signed)
Pt was not seen today. No charge for this visit today. She will reschedule for f/u appt in September.Ashley Gomez

## 2014-12-08 NOTE — Assessment & Plan Note (Signed)
Persistent trochanteric bursitis as well as myofascial pain, she does need to do physical therapy for the trochanteric bursitis. She also has significant myofascial pain syndrome.  Continue gabapentin, I am going to readd Flexeril.

## 2014-12-08 NOTE — Progress Notes (Signed)
  Subjective:    CC: Follow-up  HPI: Depression: Overall doing well on Abilify, amenable to increase to 10 mg. Crying spells have improved.  Left knee osteoarthritis colon with a degenerative meniscal tear and patellar from her chondromalacia: Has not yet seen her orthopedic surgeon for this, only for her hip.  Greater trochanteric bursitis: Received a injection by her orthopedist surgeon, now having recurrence of pain.  Chronic pain syndrome: Moderate control on gabapentin, occasional hydrocodone, she did well on Flexeril but this was discontinued due to her starting Abilify. She wonders if we can try Flexeril again. She was again advised that we would not be giving her chronic narcotics.  Past medical history, Surgical history, Family history not pertinant except as noted below, Social history, Allergies, and medications have been entered into the medical record, reviewed, and no changes needed.   Review of Systems: No fevers, chills, night sweats, weight loss, chest pain, or shortness of breath.   Objective:    General: Well Developed, well nourished, and in no acute distress.  Neuro: Alert and oriented x3, extra-ocular muscles intact, sensation grossly intact.  HEENT: Normocephalic, atraumatic, pupils equal round reactive to light, neck supple, no masses, no lymphadenopathy, thyroid nonpalpable.  Skin: Warm and dry, no rashes. Cardiac: Regular rate and rhythm, no murmurs rubs or gallops, no lower extremity edema.  Respiratory: Clear to auscultation bilaterally. Not using accessory muscles, speaking in full sentences.  Impression and Recommendations:    I spent 40 minutes with this patient, greater than 50% was face-to-face time counseling regarding the above diagnoses

## 2014-12-09 NOTE — Progress Notes (Signed)
THERAPIST PROGRESS NOTE  Session Time: 4:05pm-5:00pm  Participation Level: Active  Behavioral Response: Disheveled Alert Agitated and Tearful  Type of Therapy: Individual Therapy  Treatment Goals addressed: depression  Interventions: psycho-education regarding grief and loss   Suicidal/Homicidal: some SI without plan or intent, denies HI  Therapist Interventions:  Discussed thoughts and feelings patient had during and after her medication management appointment last Thursday.  Validated anger about it being implied to her that she should have grieved the loss of her daughter by now.  Emphasized that grief does not adhere to a particular timeline.  Also explained that it is normal to go in and out of the different stages of grief.   Shared the title of a book called I Wasn't Ready to Say Goodbye: Surviving, Coping, and Healing After the Sudden Death of a Loved One.  Provided patient with a copy of the 3rd chapter to read for homework, "Understanding the Emotional and Physical Effects of Grief."   Summary:  Reports feeling as though depressive symptoms have worsened in the past week.  She said, "I feel like the old Elleah is coming back, and that is not a good thing.  I feel ill...mean...like I could hurt somebody."  She denied having any intentions of acting on these thoughts and denied having them about any particular person. Daughter and the rest of the family have been away for the week on vacation.  Patient has been at the house by herself.  Reports she has been focused on keeping herself busy, cleaning mostly.  Indicated that she does not take time out just for relaxation.  Having insomnia at night and sleeping most of the day.  Reports an increase in smoking.  Also noted having urges to drink alcohol, but reports she has not acted on these urges.   Thanked the therapist for the material about grief and loss.  Indicated she may look into buying the book that was recommended.         Plan: Scheduled to return next week.   Diagnosis:Persistent Depressive Disorder   PTSD   [name] 12/08/2014

## 2014-12-10 ENCOUNTER — Encounter (HOSPITAL_COMMUNITY): Payer: Self-pay | Admitting: Medical

## 2014-12-14 ENCOUNTER — Other Ambulatory Visit (HOSPITAL_COMMUNITY): Payer: Self-pay | Admitting: Medical

## 2014-12-14 ENCOUNTER — Telehealth (HOSPITAL_COMMUNITY): Payer: Self-pay | Admitting: *Deleted

## 2014-12-14 MED ORDER — ARIPIPRAZOLE 5 MG PO TABS: ORAL_TABLET | ORAL | Status: DC

## 2014-12-14 NOTE — Telephone Encounter (Signed)
PT called for a refill for Abilify 5mg . PT states she has been taking 10mg  per PCP request and is out of medication. PT will need a need prescription written to fax to mail order pharmacy.

## 2014-12-15 ENCOUNTER — Ambulatory Visit (HOSPITAL_COMMUNITY): Payer: No Typology Code available for payment source | Admitting: Licensed Clinical Social Worker

## 2014-12-17 ENCOUNTER — Ambulatory Visit (INDEPENDENT_AMBULATORY_CARE_PROVIDER_SITE_OTHER): Payer: No Typology Code available for payment source | Admitting: Medical

## 2014-12-17 ENCOUNTER — Other Ambulatory Visit (HOSPITAL_BASED_OUTPATIENT_CLINIC_OR_DEPARTMENT_OTHER): Payer: Self-pay | Admitting: Orthopaedic Surgery

## 2014-12-17 ENCOUNTER — Telehealth: Payer: Self-pay | Admitting: *Deleted

## 2014-12-17 ENCOUNTER — Encounter (HOSPITAL_COMMUNITY): Payer: Self-pay | Admitting: Medical

## 2014-12-17 ENCOUNTER — Ambulatory Visit (INDEPENDENT_AMBULATORY_CARE_PROVIDER_SITE_OTHER): Payer: No Typology Code available for payment source | Admitting: Licensed Clinical Social Worker

## 2014-12-17 ENCOUNTER — Ambulatory Visit: Payer: No Typology Code available for payment source | Admitting: Rehabilitative and Restorative Service Providers"

## 2014-12-17 VITALS — BP 120/70 | HR 94 | Ht 66.0 in | Wt 222.0 lb

## 2014-12-17 DIAGNOSIS — F4321 Adjustment disorder with depressed mood: Secondary | ICD-10-CM

## 2014-12-17 DIAGNOSIS — F341 Dysthymic disorder: Secondary | ICD-10-CM

## 2014-12-17 DIAGNOSIS — F4329 Adjustment disorder with other symptoms: Secondary | ICD-10-CM

## 2014-12-17 DIAGNOSIS — G894 Chronic pain syndrome: Secondary | ICD-10-CM

## 2014-12-17 DIAGNOSIS — Z634 Disappearance and death of family member: Secondary | ICD-10-CM

## 2014-12-17 DIAGNOSIS — F607 Dependent personality disorder: Secondary | ICD-10-CM

## 2014-12-17 DIAGNOSIS — F4310 Post-traumatic stress disorder, unspecified: Secondary | ICD-10-CM

## 2014-12-17 DIAGNOSIS — F331 Major depressive disorder, recurrent, moderate: Secondary | ICD-10-CM

## 2014-12-17 MED ORDER — ARIPIPRAZOLE 5 MG PO TABS: 5.0000 mg | ORAL_TABLET | Freq: Two times a day (BID) | ORAL | Status: DC

## 2014-12-17 NOTE — Progress Notes (Signed)
THERAPIST PROGRESS NOTE  Session Time: 4:00pm-5:00pm  Participation Level: Active  Behavioral Response: Disheveled Alert Tearful  Type of Therapy: Individual Therapy  Treatment Goals addressed: depression  Interventions: supportive   Suicidal/Homicidal: some SI without plan or intent, denies HI  Therapist Interventions:  Explored how patient's view of herself has changed over her lifetime as she has experienced stressful or traumatic events.  Discussed how she struggles to maintain a high level of self-worth.     Summary:  Continues to feel hurt, agitated, and taken advantage of by her daughter.  She said, "It just seems like nothing I do is good enough."  Once again described feeling as though she was slipping back into old habits which are not helpful.  Expressed a belief that ever since her daughter's death she has been more "vulnerable and easy to take advantage of."  She said "I'm sick of feeling this way."   Noted that the anniversary of her daughter's death is coming up on 2023/01/22.   Showed therapist photos of her [employer] where she had arranged some flowers.  Mentioned that she visits the site every Sunday.   Said she did not have an opportunity to read the materials about grief and loss.  Intends to do some reading tomorrow because she does not have any plans.      Plan: Scheduled to return next week.   Diagnosis:Persistent Depressive Disorder   PTSD   [name] 12/17/2014

## 2014-12-17 NOTE — Progress Notes (Signed)
   [place] Follow-up Outpatient Visit  Ashley Gomez 1970-06-26  Date: 12/17/2014   Subjective: Ortho surgeon increased Abilify which is authorized by outside provider for unisured from 5 mg to 10 mg daily-she says she is down to 1 pill. I had increased her Wellbutrin for the reason her Ortho increased her abilift y without consulting so there is confounding variables now.  Filed Vitals:   12/17/14 1534  BP: 120/70  Pulse: 94    Mental Status Examination  Appearance: Looks better/fairly groomed not crying Alert: Yes Attention: good  Cooperative: Yes Eye Contact: Good Speech:  WNL Psychomotor Activity: Normal except for limp Lt knee Memory/Concentration: intact Oriented: person, place, time/date and situation Mood: Dysphoric Affect: Congruent Thought Processes and Associations: Circumstantial, Goal Directed and Logical Fund of Knowledge: Fair Thought Content:No Suicidal ideation, Homicidal ideation, Auditory hallucinations, Visual hallucinations, Delusions and Paranoia Insight: Poor Judgement: Fair  Diagnosis:  Diagnosis:     Persistent Depressive Disorder                           PTSD                                            Complicated grief Treatment Plan: Form for abilify redone to allow for 10 mg daily                                FU in 2 weeks (4 weeks from original visit)  Darlyne Russian, PA-C

## 2014-12-17 NOTE — Telephone Encounter (Signed)
Pt left vm this morning wanting you to know that Dr. Erlinda Hong has her scheduled for knee surgery at 9:30 on 8/17.  He wants her to do PT after her surgery and also to see you 2 weeks after to make sure there's no infection.  She stated that he is skeptical that the surgery will even help her.  He also is going to start her on lovenox to prevent clots.  Just an FYI.

## 2014-12-18 NOTE — Telephone Encounter (Signed)
Sounds good, please let her know that using an oral medication like Xarelto would be just as effective to prevent clots, and to ask her orthopedic surgeon about using the oral medication rather than the injection.

## 2014-12-22 ENCOUNTER — Other Ambulatory Visit: Payer: Self-pay | Admitting: *Deleted

## 2014-12-22 MED ORDER — HYDROCODONE-ACETAMINOPHEN 7.5-325 MG PO TABS: ORAL_TABLET | ORAL | Status: DC

## 2014-12-22 MED ORDER — DIAZEPAM 10 MG PO TABS: 5.0000 mg | ORAL_TABLET | Freq: Two times a day (BID) | ORAL | Status: DC | PRN

## 2014-12-25 ENCOUNTER — Ambulatory Visit (INDEPENDENT_AMBULATORY_CARE_PROVIDER_SITE_OTHER): Payer: No Typology Code available for payment source | Admitting: Licensed Clinical Social Worker

## 2014-12-25 DIAGNOSIS — F4310 Post-traumatic stress disorder, unspecified: Secondary | ICD-10-CM

## 2014-12-25 DIAGNOSIS — F4329 Adjustment disorder with other symptoms: Secondary | ICD-10-CM

## 2014-12-25 DIAGNOSIS — F4321 Adjustment disorder with depressed mood: Secondary | ICD-10-CM

## 2014-12-25 DIAGNOSIS — Z634 Disappearance and death of family member: Secondary | ICD-10-CM

## 2014-12-25 DIAGNOSIS — F341 Dysthymic disorder: Secondary | ICD-10-CM

## 2014-12-26 NOTE — Progress Notes (Signed)
THERAPIST PROGRESS NOTE  Session Time: 1:00pm-2:00pm  Participation Level: Active  Behavioral Response: Disheveled Alert Tearful  Type of Therapy: Individual Therapy  Treatment Goals addressed: depression  Interventions: supportive   Suicidal/Homicidal: some SI without plan or intent, denies HI  Therapist Interventions:  Explored ways patient has coped with her grief since loss of daughter.  Prompted her to identify some things she has consistently avoided.  Inquired as to whether or not she would consider participating in another support group, perhaps specifically for those who have lost their children.   Expressed concern about patient's poor memory.  Asked if she has been to see a neurologist.       Summary:  Reported that she participated in two grief and loss groups within the first year of her loss: [facility] and [facility].  Indicated she got more out of going to the second one because it was only for those who have lost a child.  Said she wished the program had continued on longer than it had.  Indicated she hasn't ruled out going to a group again. Identified several things she has purposely avoided: reading the medical report for her daughter, driving on highways, listening to music especially guitar, and going through her daughter's belongings (clothes, wallet, cell phone).  Each time she has thought about facing these things she automatically bursts into tears. Had trouble recalling events from the past week.  Later in the session she suddenly remembered that she had attended a birthday party for her grandson.  She started crying saying "How could I have forgotten that?"  Reports her primary care doctor has referred her to a neurologist.  Apparently they are gathering medical records before contacting patient to set up an appointment.    Did not do the reading about grief and loss.  Having too much trouble concentrating.       Plan: Scheduled to return in  approximately two weeks on account of her having knee surgery next week.    Diagnosis:Persistent Depressive Disorder   PTSD   [name] 12/25/2014

## 2014-12-28 ENCOUNTER — Encounter (HOSPITAL_BASED_OUTPATIENT_CLINIC_OR_DEPARTMENT_OTHER): Payer: Self-pay | Admitting: *Deleted

## 2014-12-28 ENCOUNTER — Telehealth (HOSPITAL_COMMUNITY): Payer: Self-pay | Admitting: *Deleted

## 2014-12-28 NOTE — Telephone Encounter (Signed)
Received Abilify 5mg , #60. Called and informed pt medication is ready for pickup. PT will pickup medication on 12/28/14. PT states and shows understanding. PT has a f/u appt on 01/21/15.

## 2014-12-30 ENCOUNTER — Ambulatory Visit (HOSPITAL_BASED_OUTPATIENT_CLINIC_OR_DEPARTMENT_OTHER): Payer: No Typology Code available for payment source | Admitting: Anesthesiology

## 2014-12-30 ENCOUNTER — Encounter (HOSPITAL_BASED_OUTPATIENT_CLINIC_OR_DEPARTMENT_OTHER): Payer: Self-pay | Admitting: Anesthesiology

## 2014-12-30 ENCOUNTER — Ambulatory Visit (HOSPITAL_BASED_OUTPATIENT_CLINIC_OR_DEPARTMENT_OTHER)
Admission: RE | Admit: 2014-12-30 | Discharge: 2014-12-30 | Disposition: A | Discharge status: Home / Self Care | Payer: Self-pay | Source: Ambulatory Visit | Attending: Orthopaedic Surgery | Admitting: Orthopaedic Surgery

## 2014-12-30 ENCOUNTER — Ambulatory Visit (HOSPITAL_BASED_OUTPATIENT_CLINIC_OR_DEPARTMENT_OTHER): Payer: Self-pay | Admitting: Anesthesiology

## 2014-12-30 ENCOUNTER — Encounter (HOSPITAL_BASED_OUTPATIENT_CLINIC_OR_DEPARTMENT_OTHER)
Admission: RE | Disposition: A | Discharge status: Home / Self Care | Payer: Self-pay | Source: Ambulatory Visit | Attending: Orthopaedic Surgery

## 2014-12-30 DIAGNOSIS — F17210 Nicotine dependence, cigarettes, uncomplicated: Secondary | ICD-10-CM | POA: Insufficient documentation

## 2014-12-30 DIAGNOSIS — Z6835 Body mass index (BMI) 35.0-35.9, adult: Secondary | ICD-10-CM | POA: Insufficient documentation

## 2014-12-30 DIAGNOSIS — I10 Essential (primary) hypertension: Secondary | ICD-10-CM | POA: Insufficient documentation

## 2014-12-30 DIAGNOSIS — M797 Fibromyalgia: Secondary | ICD-10-CM | POA: Insufficient documentation

## 2014-12-30 DIAGNOSIS — M2242 Chondromalacia patellae, left knee: Secondary | ICD-10-CM | POA: Insufficient documentation

## 2014-12-30 DIAGNOSIS — G35 Multiple sclerosis: Secondary | ICD-10-CM | POA: Insufficient documentation

## 2014-12-30 DIAGNOSIS — F329 Major depressive disorder, single episode, unspecified: Secondary | ICD-10-CM | POA: Insufficient documentation

## 2014-12-30 DIAGNOSIS — F419 Anxiety disorder, unspecified: Secondary | ICD-10-CM | POA: Insufficient documentation

## 2014-12-30 DIAGNOSIS — Z86718 Personal history of other venous thrombosis and embolism: Secondary | ICD-10-CM | POA: Insufficient documentation

## 2014-12-30 DIAGNOSIS — Z79899 Other long term (current) drug therapy: Secondary | ICD-10-CM | POA: Insufficient documentation

## 2014-12-30 DIAGNOSIS — F4310 Post-traumatic stress disorder, unspecified: Secondary | ICD-10-CM | POA: Insufficient documentation

## 2014-12-30 DIAGNOSIS — Z8673 Personal history of transient ischemic attack (TIA), and cerebral infarction without residual deficits: Secondary | ICD-10-CM | POA: Insufficient documentation

## 2014-12-30 DIAGNOSIS — R569 Unspecified convulsions: Secondary | ICD-10-CM | POA: Insufficient documentation

## 2014-12-30 DIAGNOSIS — M659 Synovitis and tenosynovitis, unspecified: Secondary | ICD-10-CM | POA: Insufficient documentation

## 2014-12-30 DIAGNOSIS — Z79891 Long term (current) use of opiate analgesic: Secondary | ICD-10-CM | POA: Insufficient documentation

## 2014-12-30 HISTORY — PX: CHONDROPLASTY: SHX5177

## 2014-12-30 HISTORY — DX: Post-traumatic stress disorder, unspecified: F43.10

## 2014-12-30 HISTORY — PX: KNEE ARTHROSCOPY: SHX127

## 2014-12-30 HISTORY — DX: Myoneural disorder, unspecified: G70.9

## 2014-12-30 HISTORY — DX: Sleep apnea, unspecified: G47.30

## 2014-12-30 HISTORY — DX: Fibromyalgia: M79.7

## 2014-12-30 LAB — POCT HEMOGLOBIN-HEMACUE: Hemoglobin: 13.7 g/dL (ref 12.0–15.0)

## 2014-12-30 SURGERY — ARTHROSCOPY, KNEE
Anesthesia: General | Site: Knee | Laterality: Left

## 2014-12-30 MED ORDER — ONDANSETRON HCL 4 MG/2ML IJ SOLN
INTRAMUSCULAR | Status: DC | PRN
  Administered 2014-12-30: 4 mg via INTRAVENOUS

## 2014-12-30 MED ORDER — LACTATED RINGERS IV SOLN
INTRAVENOUS | Status: DC
  Administered 2014-12-30: 09:00:00 via INTRAVENOUS

## 2014-12-30 MED ORDER — HYDROMORPHONE HCL 1 MG/ML IJ SOLN
0.2500 mg | INTRAMUSCULAR | Status: DC | PRN
  Administered 2014-12-30 (×4): 0.5 mg via INTRAVENOUS

## 2014-12-30 MED ORDER — MORPHINE SULFATE (PF) 10 MG/ML IV SOLN
INTRAVENOUS | Status: AC
  Filled 2014-12-30: qty 1

## 2014-12-30 MED ORDER — WARFARIN SODIUM 2 MG PO TABS: 2.0000 mg | ORAL_TABLET | Freq: Every day | ORAL | Status: DC

## 2014-12-30 MED ORDER — OXYCODONE HCL 5 MG/5ML PO SOLN: 5.0000 mg | Freq: Once | ORAL | Status: AC | PRN

## 2014-12-30 MED ORDER — CEFAZOLIN SODIUM-DEXTROSE 2-3 GM-% IV SOLR
2.0000 g | INTRAVENOUS | Status: AC
  Administered 2014-12-30: 2 g via INTRAVENOUS

## 2014-12-30 MED ORDER — FENTANYL CITRATE (PF) 100 MCG/2ML IJ SOLN
50.0000 ug | INTRAMUSCULAR | Status: DC | PRN
  Administered 2014-12-30: 50 ug via INTRAVENOUS
  Administered 2014-12-30: 100 ug via INTRAVENOUS

## 2014-12-30 MED ORDER — HYDROMORPHONE HCL 1 MG/ML IJ SOLN
INTRAMUSCULAR | Status: AC
  Filled 2014-12-30: qty 1

## 2014-12-30 MED ORDER — DEXAMETHASONE SODIUM PHOSPHATE 10 MG/ML IJ SOLN
INTRAMUSCULAR | Status: DC | PRN
  Administered 2014-12-30: 10 mg via INTRAVENOUS

## 2014-12-30 MED ORDER — MIDAZOLAM HCL 2 MG/2ML IJ SOLN
1.0000 mg | INTRAMUSCULAR | Status: DC | PRN
  Administered 2014-12-30: 2 mg via INTRAVENOUS

## 2014-12-30 MED ORDER — SCOPOLAMINE 1 MG/3DAYS TD PT72: 1.0000 | MEDICATED_PATCH | Freq: Once | TRANSDERMAL | Status: DC | PRN

## 2014-12-30 MED ORDER — GLYCOPYRROLATE 0.2 MG/ML IJ SOLN: 0.2000 mg | Freq: Once | INTRAMUSCULAR | Status: DC | PRN

## 2014-12-30 MED ORDER — HYDROCODONE-ACETAMINOPHEN 5-325 MG PO TABS: 1.0000 | ORAL_TABLET | Freq: Four times a day (QID) | ORAL | Status: DC | PRN

## 2014-12-30 MED ORDER — FENTANYL CITRATE (PF) 100 MCG/2ML IJ SOLN
INTRAMUSCULAR | Status: AC
  Filled 2014-12-30: qty 4

## 2014-12-30 MED ORDER — KETOROLAC TROMETHAMINE 30 MG/ML IJ SOLN
30.0000 mg | Freq: Once | INTRAMUSCULAR | Status: AC
  Administered 2014-12-30: 30 mg via INTRAVENOUS

## 2014-12-30 MED ORDER — SODIUM CHLORIDE 0.9 % IR SOLN
Status: DC | PRN
  Administered 2014-12-30: 1500 mL

## 2014-12-30 MED ORDER — PROPOFOL 10 MG/ML IV BOLUS
INTRAVENOUS | Status: DC | PRN
  Administered 2014-12-30: 200 mg via INTRAVENOUS

## 2014-12-30 MED ORDER — BUPIVACAINE HCL (PF) 0.5 % IJ SOLN
INTRAMUSCULAR | Status: AC
  Filled 2014-12-30: qty 30

## 2014-12-30 MED ORDER — KETOROLAC TROMETHAMINE 30 MG/ML IJ SOLN
INTRAMUSCULAR | Status: AC
  Filled 2014-12-30: qty 1

## 2014-12-30 MED ORDER — LIDOCAINE HCL (CARDIAC) 20 MG/ML IV SOLN
INTRAVENOUS | Status: DC | PRN
  Administered 2014-12-30: 50 mg via INTRAVENOUS

## 2014-12-30 MED ORDER — LACTATED RINGERS IV SOLN
INTRAVENOUS | Status: DC | PRN
  Administered 2014-12-30 (×2): via INTRAVENOUS

## 2014-12-30 MED ORDER — CEFAZOLIN SODIUM-DEXTROSE 2-3 GM-% IV SOLR
INTRAVENOUS | Status: AC
  Filled 2014-12-30: qty 50

## 2014-12-30 MED ORDER — MIDAZOLAM HCL 2 MG/2ML IJ SOLN
INTRAMUSCULAR | Status: AC
  Filled 2014-12-30: qty 2

## 2014-12-30 MED ORDER — OXYCODONE HCL 5 MG PO TABS
5.0000 mg | ORAL_TABLET | Freq: Once | ORAL | Status: AC | PRN
  Administered 2014-12-30: 5 mg via ORAL

## 2014-12-30 MED ORDER — OXYCODONE HCL 5 MG PO TABS
ORAL_TABLET | ORAL | Status: AC
  Filled 2014-12-30: qty 1

## 2014-12-30 MED ORDER — PROMETHAZINE HCL 25 MG/ML IJ SOLN: 6.2500 mg | INTRAMUSCULAR | Status: DC | PRN

## 2014-12-30 MED ORDER — BUPIVACAINE HCL (PF) 0.5 % IJ SOLN
INTRAMUSCULAR | Status: DC | PRN
  Administered 2014-12-30: 17 mL via INTRA_ARTICULAR

## 2014-12-30 SURGICAL SUPPLY — 39 items
BANDAGE ELASTIC 6 VELCRO ST LF (GAUZE/BANDAGES/DRESSINGS) ×6 IMPLANT
BANDAGE ESMARK 6X9 LF (GAUZE/BANDAGES/DRESSINGS) IMPLANT
BLADE 4.2CUDA (BLADE) ×3 IMPLANT
BLADE CUDA GRT WHITE 3.5 (BLADE) IMPLANT
BLADE CUDA SHAVER 3.5 (BLADE) IMPLANT
BLADE CUTTER GATOR 3.5 (BLADE) IMPLANT
BNDG ESMARK 6X9 LF (GAUZE/BANDAGES/DRESSINGS)
CUFF TOURNIQUET SINGLE 34IN LL (TOURNIQUET CUFF) ×3 IMPLANT
DRAPE ARTHROSCOPY W/POUCH 90 (DRAPES) ×3 IMPLANT
DRAPE SURG 17X23 STRL (DRAPES) ×6 IMPLANT
DURAPREP 26ML APPLICATOR (WOUND CARE) ×3 IMPLANT
ELECT MENISCUS 165MM 90D (ELECTRODE) IMPLANT
ELECT REM PT RETURN 9FT ADLT (ELECTROSURGICAL)
ELECTRODE REM PT RTRN 9FT ADLT (ELECTROSURGICAL) IMPLANT
GAUZE SPONGE 4X4 12PLY STRL (GAUZE/BANDAGES/DRESSINGS) ×3 IMPLANT
GAUZE XEROFORM 1X8 LF (GAUZE/BANDAGES/DRESSINGS) ×3 IMPLANT
GLOVE BIOGEL PI IND STRL 7.0 (GLOVE) ×4 IMPLANT
GLOVE BIOGEL PI INDICATOR 7.0 (GLOVE) ×2
GLOVE ECLIPSE 6.5 STRL STRAW (GLOVE) ×3 IMPLANT
GLOVE NEODERM STRL 7.5 LF PF (GLOVE) ×2 IMPLANT
GLOVE SURG NEODERM 7.5  LF PF (GLOVE) ×1
GLOVE SURG SYN 7.5  E (GLOVE) ×1
GLOVE SURG SYN 7.5 E (GLOVE) ×2 IMPLANT
GOWN STRL REIN XL XLG (GOWN DISPOSABLE) ×3 IMPLANT
GOWN STRL REUS W/ TWL LRG LVL3 (GOWN DISPOSABLE) ×2 IMPLANT
GOWN STRL REUS W/TWL LRG LVL3 (GOWN DISPOSABLE) ×1
IV NS IRRIG 3000ML ARTHROMATIC (IV SOLUTION) ×3 IMPLANT
KNEE WRAP E Z 3 GEL PACK (MISCELLANEOUS) ×3 IMPLANT
MANIFOLD NEPTUNE II (INSTRUMENTS) ×3 IMPLANT
PACK ARTHROSCOPY DSU (CUSTOM PROCEDURE TRAY) ×3 IMPLANT
PACK BASIN DAY SURGERY FS (CUSTOM PROCEDURE TRAY) ×3 IMPLANT
RESECTOR FULL RADIUS 4.2MM (BLADE) ×3 IMPLANT
SET ARTHROSCOPY TUBING (MISCELLANEOUS) ×1
SET ARTHROSCOPY TUBING LN (MISCELLANEOUS) ×2 IMPLANT
SLEEVE SCD COMPRESS KNEE MED (MISCELLANEOUS) ×3 IMPLANT
SUT ETHILON 3 0 PS 1 (SUTURE) ×3 IMPLANT
TOWEL OR 17X24 6PK STRL BLUE (TOWEL DISPOSABLE) ×3 IMPLANT
TOWEL OR NON WOVEN STRL DISP B (DISPOSABLE) IMPLANT
WATER STERILE IRR 1000ML POUR (IV SOLUTION) ×3 IMPLANT

## 2014-12-30 NOTE — Anesthesia Procedure Notes (Signed)
Procedure Name: LMA Insertion Date/Time: 12/30/2014 9:34 AM Performed by: Lieutenant Diego Pre-anesthesia Checklist: Patient identified, Emergency Drugs available, Suction available and Patient being monitored Patient Re-evaluated:Patient Re-evaluated prior to inductionOxygen Delivery Method: Circle System Utilized Preoxygenation: Pre-oxygenation with 100% oxygen Intubation Type: IV induction Ventilation: Mask ventilation without difficulty LMA: LMA inserted LMA Size: 4.0 Number of attempts: 1 Airway Equipment and Method: Bite block Placement Confirmation: positive ETCO2 and breath sounds checked- equal and bilateral Tube secured with: Tape Dental Injury: Teeth and Oropharynx as per pre-operative assessment

## 2014-12-30 NOTE — Anesthesia Preprocedure Evaluation (Signed)
Anesthesia Evaluation  Patient identified by MRN, date of birth, ID band Patient awake    Reviewed: Allergy & Precautions, NPO status , Patient's Chart, lab work & pertinent test results  Airway Mallampati: II  TM Distance: >3 FB Neck ROM: Full    Dental  (+) Teeth Intact, Dental Advisory Given   Pulmonary sleep apnea , Current Smoker,  breath sounds clear to auscultation        Cardiovascular hypertension, Pt. on medications Rhythm:Regular Rate:Normal     Neuro/Psych Seizures -, Well Controlled,  Anxiety Depression    GI/Hepatic negative GI ROS, Neg liver ROS,   Endo/Other  Morbid obesity  Renal/GU negative Renal ROS     Musculoskeletal  (+) Arthritis -, Fibromyalgia -  Abdominal   Peds  Hematology negative hematology ROS (+)   Anesthesia Other Findings   Reproductive/Obstetrics                             Anesthesia Physical Anesthesia Plan  ASA: II  Anesthesia Plan: General   Post-op Pain Management:    Induction: Intravenous  Airway Management Planned: LMA  Additional Equipment:   Intra-op Plan:   Post-operative Plan:   Informed Consent: I have reviewed the patients History and Physical, chart, labs and discussed the procedure including the risks, benefits and alternatives for the proposed anesthesia with the patient or authorized representative who has indicated his/her understanding and acceptance.   Dental advisory given  Plan Discussed with: CRNA  Anesthesia Plan Comments:         Anesthesia Quick Evaluation

## 2014-12-30 NOTE — Transfer of Care (Signed)
Immediate Anesthesia Transfer of Care Note  Patient: Ashley Gomez  Procedure(s) Performed: Procedure(s): LEFT KNEE ARTHROSCOPY WITH   CHONDROPLASTY (Left) CHONDROPLASTY (Left)  Patient Location: PACU  Anesthesia Type:General  Level of Consciousness: awake and alert   Airway & Oxygen Therapy: Patient Spontanous Breathing and Patient connected to face mask oxygen  Post-op Assessment: Report given to RN and Post -op Vital signs reviewed and stable  Post vital signs: Reviewed and stable  Last Vitals:  Filed Vitals:   12/30/14 0918  BP: 127/77  Pulse: 89  Temp: 36.4 C  Resp: 20    Complications: No apparent anesthesia complications

## 2014-12-30 NOTE — Op Note (Addendum)
   Surgery Date: 12/30/2014  Surgeon(s): Kamden Reber Ephriam Jenkins, MD  ANESTHESIA:  general  FLUIDS: Per anesthesia record.   ESTIMATED BLOOD LOSS: minimal  PREOPERATIVE DIAGNOSES:  1. Left knee medial compartment chondromalacia 2. Left knee patella chondromalacia 3. Left knee synovitis  POSTOPERATIVE DIAGNOSES:  same  PROCEDURES PERFORMED:  1. Left knee arthroscopy with extensive synovectomy 2. Left knee arthroscopy with arthroscopic chondroplasty medial femoral condyle, medial tibial plateau, femoral trochlea, patella.  DESCRIPTION OF PROCEDURE: Ashley Gomez is a 44 y.o.-year-old female with Left knee chondromalacia. Plans are to proceed with diagnostic arthroscopy with debridement as indicated. Full discussion held regarding risks benefits alternatives and complications related surgical intervention. Conservative care options reviewed. All questions answered.  The patient was identified in the preoperative holding area and the operative extremity was marked. The patient was brought to the operating room and transferred to operating table in a supine position. Satisfactory general anesthesia was induced by Anesthesiology.   Standard anterolateral, anteromedial arthroscopy portals were obtained. The anteromedial portal was obtained with a spinal needle for localization under direct visualization with subsequent diagnostic findings.   Anteromedial and anterolateral chambers: moderate synovitis. The synovitis was debrided with a 4.2 mm full radius shaver through both the anteromedial and lateral portals.  Chondroplasty was performed on the medial femoral condyle, medial tibial plateau, patella, femoral trochlea.  Suprapatellar pouch and gutters: moderate synovitis or debris. Patella chondral surface: Grade 2 Trochlear chondral surface: Grade 2 Patellofemoral tracking: normal Medial meniscus: normal.  Medial femoral condyle flexion bearing surface: Grade 4 Medial femoral condyle extension  bearing surface: Grade 4 Medial tibial plateau: Grade 3 Anterior cruciate ligament:stable Posterior cruciate ligament:stable Lateral meniscus: normal.   Lateral femoral condyle flexion bearing surface: Grade 0 Lateral femoral condyle extension bearing surface: Grade 0 Lateral tibial plateau: Grade 0  After completion of synovectomy, diagnostic exam, and debridements as described, all compartments were checked and no residual debris remained. The portals were approximated with interrupted nylon suture. All excess fluid was expressed from the joint. The portals were approximated with interrupted nylon suture. Xeroform sterile gauze dressings were applied followed by Ace bandage and ice pack.   DISPOSITION: The patient was awakened from general anesthetic, extubated, taken to the recovery room in medically stable condition, no apparent complications. The patient may be weightbearing as tolerated in left lower extremity.  Range of motion of right knee as tolerated.  Patient is allergic to aspirin and lovenox.  She will be on low dose coumadin for 2 weeks.  Ashley Cecil, MD [place] 10:03 AM

## 2014-12-30 NOTE — H&P (Signed)
PREOPERATIVE H&P  Chief Complaint: left knee chondromalacia patella, medial meniscal tear  HPI: Ashley Gomez is a 44 y.o. female who presents for surgical treatment of left knee chondromalacia patella, medial meniscal tear.  She denies any changes in medical history.  Past Medical History  Diagnosis Date  . Depression   . Anxiety   . MS (multiple sclerosis)   . Obesity   . Smoking   . Migraines   . Facet hypertrophy of lumbar region     MRI 2007  . DVT (deep venous thrombosis) 81/85/6314    L basilic vein   . CVA (cerebral infarction)     Old CVA on MRI brain  . Hypertension     no meds now, lost 50lbs  . Sleep apnea     diagnosed 20 ago, lost wt no CPAP now  . PTSD (post-traumatic stress disorder)   . Seizures 2011    no seizure since onset  . Neuromuscular disorder   . Fibromyalgia    Past Surgical History  Procedure Laterality Date  . Tubal ligation  1992  . Tonsillectomy     Social History   Social History  . Marital Status: Single    Spouse Name: N/A  . Number of Children: N/A  . Years of Education: N/A   Social History Main Topics  . Smoking status: Current Every Day Smoker -- 1.00 packs/day for 25 years    Types: Cigarettes  . Smokeless tobacco: Never Used     Comment: advised to d/c by 3 cigs per week.  . Alcohol Use: No     Comment: occasional  . Drug Use: No  . Sexual Activity: No   Other Topics Concern  . None   Social History Narrative   Family History  Problem Relation Age of Onset  . Cancer Mother 48    melanoma  . Hypertension Sister   . Heart disease Sister     valve disease  . Depression Sister   . Diabetes Sister   . Sjogren's syndrome Sister    Allergies  Allergen Reactions  . Ace Inhibitors     REACTION: cough  . Aspirin Other (See Comments)    Stomach Ulcer  . Atenolol Other (See Comments)    Bradycardia  . Celexa [Citalopram Hydrobromide] Other (See Comments)    Dizziness   . Codeine Nausea Only  .  Phenytoin   . Sertraline Other (See Comments)  . Triamcinolone     Steroid flare after joint injection, use other steroid for injections   Prior to Admission medications   Medication Sig Start Date End Date Taking? Authorizing Provider  ARIPiprazole (ABILIFY) 5 MG tablet Take 1 tablet (5 mg total) by mouth 2 (two) times daily. for adjunctive treatment of depression. 12/17/14  Yes Dara Hoyer, PA-C  buPROPion (WELLBUTRIN XL) 300 MG 24 hr tablet Take 1 tablet (300 mg total) by mouth every morning. 12/03/14  Yes Dara Hoyer, PA-C  cyanocobalamin 1000 MCG tablet Take 100 mcg by mouth daily.   Yes Historical Provider, MD  cyclobenzaprine (FLEXERIL) 10 MG tablet Take 1 tablet (10 mg total) by mouth 3 (three) times daily. 12/08/14  Yes Silverio Decamp, MD  diazepam (VALIUM) 10 MG tablet Take 0.5-1 tablets (5-10 mg total) by mouth every 12 (twelve) hours as needed for anxiety. 12/22/14  Yes Hali Marry, MD  gabapentin (NEURONTIN) 300 MG capsule Take 1 capsule (300 mg total) by mouth 3 (three) times daily. 10/22/14  Yes Ruben Im, PA-C  hydrochlorothiazide (HYDRODIURIL) 25 MG tablet TAKE ONE TABLET BY MOUTH DAILY AS NEEDED 11/04/14  Yes Hali Marry, MD  HYDROcodone-acetaminophen (NORCO) 7.5-325 MG per tablet TAKE ONE TABLET BY MOUTH EVERY EIGHT HOURS AS NEEDED FOR PAIN 12/22/14  Yes Hali Marry, MD  magnesium oxide (MAG-OX) 400 MG tablet Take 2 tablets (800 mg total) by mouth at bedtime. 11/09/14  Yes Silverio Decamp, MD  SUMAtriptan (IMITREX) 100 MG tablet Take 1 tablet (100 mg total) by mouth every 2 (two) hours as needed for migraine. No more than 2 in 24 hours. 11/04/14  Yes Hali Marry, MD  topiramate (TOPAMAX) 100 MG tablet Take one tablet by mouth in the AM and two tablets in the evening for mood stabilization a. 12/08/14  Yes Dara Hoyer, PA-C  diphenoxylate-atropine (LOMOTIL) 2.5-0.025 MG per tablet Take 1 tablet by mouth 4 (four) times daily as  needed for diarrhea or loose stools. 05/12/13   Jade L Breeback, PA-C     Positive ROS: All other systems have been reviewed and were otherwise negative with the exception of those mentioned in the HPI and as above.  Physical Exam: General: Alert, no acute distress Cardiovascular: No pedal edema Respiratory: No cyanosis, no use of accessory musculature GI: abdomen soft Skin: No lesions in the area of chief complaint Neurologic: Sensation intact distally Psychiatric: Patient is competent for consent with normal mood and affect Lymphatic: no lymphedema  MUSCULOSKELETAL: exam stable  Assessment: left knee chondromalacia patella, medial meniscal tear  Plan: Plan for Procedure(s): LEFT KNEE ARTHROSCOPY WITH PARTIAL MEDIAL MENISCECTOMY, CHONDROPLASTY  The risks benefits and alternatives were discussed with the patient including but not limited to the risks of nonoperative treatment, versus surgical intervention including infection, bleeding, nerve injury,  blood clots, cardiopulmonary complications, morbidity, mortality, among others, and they were willing to proceed.   Marianna Payment, MD   12/30/2014 7:21 AM

## 2014-12-30 NOTE — Discharge Instructions (Signed)
1. Remove bandage in 2 days. 2. Place bandaids on the incisions. 3. May shower on postoperative day 2. 4. Slowly increase activity as tolerated 5. Take pain medicines and stool softeners as needed. 6. Ice the knee around the clock.     Post Anesthesia Home Care Instructions  Activity: Get plenty of rest for the remainder of the day. A responsible adult should stay with you for 24 hours following the procedure.  For the next 24 hours, DO NOT: -Drive a car -Paediatric nurse -Drink alcoholic beverages -Take any medication unless instructed by your physician -Make any legal decisions or sign important papers.  Meals: Start with liquid foods such as gelatin or soup. Progress to regular foods as tolerated. Avoid greasy, spicy, heavy foods. If nausea and/or vomiting occur, drink only clear liquids until the nausea and/or vomiting subsides. Call your physician if vomiting continues.  Special Instructions/Symptoms: Your throat may feel dry or sore from the anesthesia or the breathing tube placed in your throat during surgery. If this causes discomfort, gargle with warm salt water. The discomfort should disappear within 24 hours.  If you had a scopolamine patch placed behind your ear for the management of post- operative nausea and/or vomiting:  1. The medication in the patch is effective for 72 hours, after which it should be removed.  Wrap patch in a tissue and discard in the trash. Wash hands thoroughly with soap and water. 2. You may remove the patch earlier than 72 hours if you experience unpleasant side effects which may include dry mouth, dizziness or visual disturbances. 3. Avoid touching the patch. Wash your hands with soap and water after contact with the patch.

## 2014-12-30 NOTE — Anesthesia Postprocedure Evaluation (Signed)
  Anesthesia Post-op Note  Patient: Ashley Gomez  Procedure(s) Performed: Procedure(s): LEFT KNEE ARTHROSCOPY WITH   CHONDROPLASTY (Left) CHONDROPLASTY (Left)  Patient Location: PACU  Anesthesia Type:General  Level of Consciousness: awake and alert   Airway and Oxygen Therapy: Patient Spontanous Breathing  Post-op Pain: mild  Post-op Assessment: Post-op Vital signs reviewed LLE Motor Response: Purposeful movement LLE Sensation: Decreased          Post-op Vital Signs: Reviewed  Last Vitals:  Filed Vitals:   12/30/14 1111  BP: 116/61  Pulse: 83  Temp: 36.5 C  Resp: 16    Complications: No apparent anesthesia complications

## 2014-12-31 ENCOUNTER — Encounter (HOSPITAL_BASED_OUTPATIENT_CLINIC_OR_DEPARTMENT_OTHER): Payer: Self-pay | Admitting: Orthopaedic Surgery

## 2014-12-31 ENCOUNTER — Ambulatory Visit (HOSPITAL_COMMUNITY): Payer: Self-pay | Admitting: Medical

## 2015-01-01 ENCOUNTER — Ambulatory Visit (HOSPITAL_COMMUNITY): Payer: Self-pay | Admitting: Licensed Clinical Social Worker

## 2015-01-05 ENCOUNTER — Telehealth (HOSPITAL_COMMUNITY): Payer: Self-pay | Admitting: *Deleted

## 2015-01-05 DIAGNOSIS — Z634 Disappearance and death of family member: Secondary | ICD-10-CM

## 2015-01-05 DIAGNOSIS — F4329 Adjustment disorder with other symptoms: Secondary | ICD-10-CM

## 2015-01-05 DIAGNOSIS — F331 Major depressive disorder, recurrent, moderate: Secondary | ICD-10-CM

## 2015-01-05 DIAGNOSIS — F4321 Adjustment disorder with depressed mood: Secondary | ICD-10-CM

## 2015-01-05 DIAGNOSIS — F341 Dysthymic disorder: Secondary | ICD-10-CM

## 2015-01-05 DIAGNOSIS — F4310 Post-traumatic stress disorder, unspecified: Secondary | ICD-10-CM

## 2015-01-05 MED ORDER — TOPIRAMATE 100 MG PO TABS: ORAL_TABLET | ORAL | Status: DC

## 2015-01-05 NOTE — Telephone Encounter (Signed)
Pt called for a refill for Topamax 100mg . Per Darlyne Russian, pt is authorized for a refill for Topamax 100mg , #90. Prescription was sent to pharmacy. Pt has a f/u appt on 01/21/15. Called and informed pt of prescription status. Pt states and shows understanding.

## 2015-01-08 ENCOUNTER — Encounter: Payer: Self-pay | Admitting: Sports Medicine

## 2015-01-08 ENCOUNTER — Ambulatory Visit (INDEPENDENT_AMBULATORY_CARE_PROVIDER_SITE_OTHER): Payer: No Typology Code available for payment source | Admitting: Sports Medicine

## 2015-01-08 ENCOUNTER — Ambulatory Visit (HOSPITAL_COMMUNITY): Payer: Self-pay | Admitting: Licensed Clinical Social Worker

## 2015-01-08 DIAGNOSIS — M2242 Chondromalacia patellae, left knee: Secondary | ICD-10-CM

## 2015-01-08 MED ORDER — RIVAROXABAN (XARELTO) VTE STARTER PACK (15 & 20 MG): ORAL_TABLET | ORAL | Status: DC

## 2015-01-08 NOTE — Progress Notes (Signed)
  Subjective:    CC: follow-up  HPI: Left knee pain: Aerie returns, she is now postarthroscopy about a week, she was found to have several areas of grade 4 and grade 3 chondromalacia, predominantly the medial compartment, as well as the patellofemoral component, no meniscal tears were seen, these were treated appropriately, and she is feeling significantly better.  Major depression: Continues on bupropion and Abilify.no suicidal or homicidal ideation  Past medical history, Surgical history, Family history not pertinant except as noted below, Social history, Allergies, and medications have been entered into the medical record, reviewed, and no changes needed.   Review of Systems: No fevers, chills, night sweats, weight loss, chest pain, or shortness of breath.   Objective:    General: Well Developed, well nourished, and in no acute distress.  Neuro: Alert and oriented x3, extra-ocular muscles intact, sensation grossly intact.  HEENT: Normocephalic, atraumatic, pupils equal round reactive to light, neck supple, no masses, no lymphadenopathy, thyroid nonpalpable.  Skin: Warm and dry, no rashes. Cardiac: Regular rate and rhythm, no murmurs rubs or gallops, no lower extremity edema.  Respiratory: Clear to auscultation bilaterally. Not using accessory muscles, speaking in full sentences. Left knee: Surgical incisions are healing well, minimal bruising, no effusion.  Impression and Recommendations:    I spent 25 minutes with this patient, greater than 50% was face-to-face time counseling regarding the above diagnoses

## 2015-01-08 NOTE — Assessment & Plan Note (Addendum)
postarthroscopy with grade 4 chondromalacia on the medial compartment as well as grade 2 chondromalacia in the patellofemoral compartment. Overall doing well, she is currently on Coumadin for prevention of thromboembolism. We are going to switch her to Xarelto. Continue Xarelto for one month.

## 2015-01-12 ENCOUNTER — Ambulatory Visit (HOSPITAL_COMMUNITY): Payer: Self-pay | Admitting: Licensed Clinical Social Worker

## 2015-01-15 ENCOUNTER — Ambulatory Visit (INDEPENDENT_AMBULATORY_CARE_PROVIDER_SITE_OTHER): Payer: No Typology Code available for payment source | Admitting: Osteopathic Medicine

## 2015-01-15 ENCOUNTER — Encounter: Payer: Self-pay | Admitting: Osteopathic Medicine

## 2015-01-15 ENCOUNTER — Ambulatory Visit (INDEPENDENT_AMBULATORY_CARE_PROVIDER_SITE_OTHER): Payer: No Typology Code available for payment source | Admitting: Licensed Clinical Social Worker

## 2015-01-15 VITALS — BP 156/95 | HR 96 | Temp 98.3°F | Wt 222.0 lb

## 2015-01-15 DIAGNOSIS — K088 Other specified disorders of teeth and supporting structures: Secondary | ICD-10-CM

## 2015-01-15 DIAGNOSIS — F4310 Post-traumatic stress disorder, unspecified: Secondary | ICD-10-CM

## 2015-01-15 DIAGNOSIS — K0510 Chronic gingivitis, plaque induced: Secondary | ICD-10-CM

## 2015-01-15 DIAGNOSIS — K047 Periapical abscess without sinus: Secondary | ICD-10-CM

## 2015-01-15 DIAGNOSIS — K0889 Other specified disorders of teeth and supporting structures: Secondary | ICD-10-CM

## 2015-01-15 DIAGNOSIS — I10 Essential (primary) hypertension: Secondary | ICD-10-CM

## 2015-01-15 DIAGNOSIS — F341 Dysthymic disorder: Secondary | ICD-10-CM

## 2015-01-15 MED ORDER — PENICILLIN V POTASSIUM 500 MG PO TABS: 500.0000 mg | ORAL_TABLET | Freq: Three times a day (TID) | ORAL | Status: DC

## 2015-01-15 NOTE — Progress Notes (Signed)
HPI: Ashley Gomez is a 44 y.o. female who presents to [facility]  today for chief complaint of:  Chief Complaint  Patient presents with  . Dental Pain   . Location: Right lower gums and jaw . Quality: Throbbing, feels numb . Severity: Moderate to severe . Duration: 2-3 days . Context: Dental work approximately 5 months ago . Assoc signs/symptoms: No fever or chills, no drainage from gums  Blood pressure elevated, patient states she is in pain and stress due to just coming from counseling appointment, no chest pressure palpitations, no shortness of breath   Past medical, social and family history reviewed: Past Medical History  Diagnosis Date  . Depression   . Anxiety   . MS (multiple sclerosis)   . Obesity   . Smoking   . Migraines   . Facet hypertrophy of lumbar region     MRI 2007  . DVT (deep venous thrombosis) 29/52/8413    L basilic vein   . CVA (cerebral infarction)     Old CVA on MRI brain  . Hypertension     no meds now, lost 50lbs  . Sleep apnea     diagnosed 20 ago, lost wt no CPAP now  . PTSD (post-traumatic stress disorder)   . Seizures 2011    no seizure since onset  . Neuromuscular disorder   . Fibromyalgia    Past Surgical History  Procedure Laterality Date  . Tubal ligation  1992  . Tonsillectomy    . Knee arthroscopy Left 12/30/2014    Procedure: LEFT KNEE ARTHROSCOPY WITH   CHONDROPLASTY;  Surgeon: Leandrew Koyanagi, MD;  Location: [facility];  Service: Orthopedics;  Laterality: Left;  . Chondroplasty Left 12/30/2014    Procedure: CHONDROPLASTY;  Surgeon: Leandrew Koyanagi, MD;  Location: [facility];  Service: Orthopedics;  Laterality: Left;   Social History  Substance Use Topics  . Smoking status: Current Every Day Smoker -- 1.00 packs/day for 25 years    Types: Cigarettes  . Smokeless tobacco: Never Used     Comment: advised to d/c by 3 cigs per week.  . Alcohol Use: No     Comment:  occasional   Family History  Problem Relation Age of Onset  . Cancer Mother 65    melanoma  . Hypertension Sister   . Heart disease Sister     valve disease  . Depression Sister   . Diabetes Sister   . Sjogren's syndrome Sister     Current Outpatient Prescriptions  Medication Sig Dispense Refill  . ARIPiprazole (ABILIFY) 5 MG tablet Take 1 tablet (5 mg total) by mouth 2 (two) times daily. for adjunctive treatment of depression. 60 tablet 2  . buPROPion (WELLBUTRIN XL) 300 MG 24 hr tablet Take 1 tablet (300 mg total) by mouth every morning. 30 tablet 2  . cyanocobalamin 1000 MCG tablet Take 100 mcg by mouth daily.    . cyclobenzaprine (FLEXERIL) 10 MG tablet Take 1 tablet (10 mg total) by mouth 3 (three) times daily. 90 tablet 3  . diazepam (VALIUM) 10 MG tablet Take 0.5-1 tablets (5-10 mg total) by mouth every 12 (twelve) hours as needed for anxiety. 60 tablet 0  . diphenoxylate-atropine (LOMOTIL) 2.5-0.025 MG per tablet Take 1 tablet by mouth 4 (four) times daily as needed for diarrhea or loose stools. 30 tablet 0  . gabapentin (NEURONTIN) 300 MG capsule Take 1 capsule (300 mg total) by mouth 3 (three)  times daily. 90 capsule 2  . hydrochlorothiazide (HYDRODIURIL) 25 MG tablet TAKE ONE TABLET BY MOUTH DAILY AS NEEDED 30 tablet 1  . HYDROcodone-acetaminophen (NORCO) 7.5-325 MG per tablet TAKE ONE TABLET BY MOUTH EVERY EIGHT HOURS AS NEEDED FOR PAIN 90 tablet 0  . magnesium oxide (MAG-OX) 400 MG tablet Take 2 tablets (800 mg total) by mouth at bedtime. 90 tablet 3  . Rivaroxaban (XARELTO STARTER PACK) 15 & 20 MG TBPK One 15mg  tablet by mouth twice a day with food. On Day 22, switch to one 20mg  tablet once a day with food. 51 each 0  . SUMAtriptan (IMITREX) 100 MG tablet Take 1 tablet (100 mg total) by mouth every 2 (two) hours as needed for migraine. No more than 2 in 24 hours. 10 tablet 6  . topiramate (TOPAMAX) 100 MG tablet Take one tablet by mouth in the AM and two tablets in the  evening for mood stabilization a. 90 tablet 0   No current facility-administered medications for this visit.   Allergies  Allergen Reactions  . Ace Inhibitors     REACTION: cough  . Aspirin Other (See Comments)    Stomach Ulcer  . Atenolol Other (See Comments)    Bradycardia  . Celexa [Citalopram Hydrobromide] Other (See Comments)    Dizziness   . Codeine Nausea Only  . Phenytoin   . Sertraline Other (See Comments)  . Triamcinolone     Steroid flare after joint injection, use other steroid for injections     Review of Systems: CONSTITUTIONAL: Neg fever/chills, no unintentional weight changes HEAD/EYES/EARS/NOSE/THROAT: No headache/vision change or hearing change, no sore throat, dental pain as noted in history of present illness CARDIAC: No chest pain/pressure/palpitations, no orthopnea RESPIRATORY: No cough/shortness of breath/wheeze   Exam:  BP 156/95 mmHg  Pulse 96  Temp(Src) 98.3 F (36.8 C) (Oral)  Wt 222 lb (100.699 kg)  SpO2 99%  LMP 12/27/2012 Constitutional: VSS, see above. General Appearance: alert, well-developed, well-nourished, NAD Eyes: Normal lids and conjunctive, non-icteric sclera, PERRLA Ears, Nose, Mouth, Throat: Normal external inspection ears/nares/mouth/lips/gums, pharynx moist, pharynx without erythema or exudate, there is poor dentition and multiple dental caries, mild , red gingiva on right lower back molars, no visible abscess or drainage  Neck: No masses, trachea midline. No thyroid enlargement/tenderness/mass appreciated. Palpable lymph nodes under her right mandible, small, mobile  Respiratory: Normal respiratory effort. No dullness/hyper-resonance to percussion. Breath sounds normal, no wheeze/rhonchi/rales Cardiovascular: S1/S2 normal, no murmur/rub/gallop auscultated. No carotid bruit or JVD. No abdominal aortic bruit. Pedal pulse II/IV bilaterally DP and PT. No lower extremity edema.     No results found for this or any previous visit  (from the past 72 hour(s)).    ASSESSMENT/PLAN:    Pain, dental - Encouraged Tylenol use, avoid NSAIDs due to her also on Xarelto, needs to follow up with dentist  Gingivitis  Dental infection - Patient is encouraged to contact her dentist and schedule appointment as soon as possible for further evaluation - Plan: penicillin v potassium (VEETID) 500 MG tablet  Essential hypertension - Possible stress versus infection reaction, we'll recheck at next visit   If no improvement, patient needs to return to the clinic for further evaluation if she is unable to get in with her dentist, however I advised her that her dentist best person to be managing this issue unless other problems develop.
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
## 2015-01-21 ENCOUNTER — Ambulatory Visit (INDEPENDENT_AMBULATORY_CARE_PROVIDER_SITE_OTHER): Payer: No Typology Code available for payment source | Admitting: Medical

## 2015-01-21 ENCOUNTER — Encounter (HOSPITAL_COMMUNITY): Payer: Self-pay | Admitting: Medical

## 2015-01-21 VITALS — BP 117/79 | HR 69 | Wt 222.0 lb

## 2015-01-21 DIAGNOSIS — F411 Generalized anxiety disorder: Secondary | ICD-10-CM

## 2015-01-21 DIAGNOSIS — G35 Multiple sclerosis: Secondary | ICD-10-CM

## 2015-01-21 DIAGNOSIS — F419 Anxiety disorder, unspecified: Secondary | ICD-10-CM

## 2015-01-21 DIAGNOSIS — F341 Dysthymic disorder: Secondary | ICD-10-CM

## 2015-01-21 DIAGNOSIS — F4310 Post-traumatic stress disorder, unspecified: Secondary | ICD-10-CM

## 2015-01-21 DIAGNOSIS — M2242 Chondromalacia patellae, left knee: Secondary | ICD-10-CM

## 2015-01-21 DIAGNOSIS — F607 Dependent personality disorder: Secondary | ICD-10-CM

## 2015-01-21 DIAGNOSIS — G894 Chronic pain syndrome: Secondary | ICD-10-CM

## 2015-01-21 NOTE — Progress Notes (Signed)
   [facility] Follow-up Outpatient Visit  IZABELA OW 08-17-1970  Date: 01/21/2015   Subjective: "Doing better-the anniversary was yesterday-it was a bad day but I'm doing better" Has knee pain from Arthrosopic procedure for [facility] ia patella LT without any tears.Also gets Abilify from assistance and is worried it will run out -has 5 days left.Seeing Counselor and beginning CBT  Filed Vitals:   01/21/15 1101  BP: 117/79  Pulse: 69    Mental Status Examination  Appearance: Well groomed;smiles occasionally;Teras in eyes when discussing yesterday Alert: Yes Attention: good  Cooperative: Yes Eye Contact: Good Speech: Clear;coherent Psychomotor Activity: Normal and Limps on Lt knee Memory/Concentration: Improved Oriented: person, place, time/date and situation Mood: Variable Affect: Full Range Thought Processes and Associations: Coherent and Logical Fund of Knowledge: Fair Thought Content:NO  Suicidal ideation, Homicidal ideation, Auditory hallucinations, Visual hallucinations, Delusions and Paranoia Insight: Fair Judgement: Fair  Diagnosis:  PTSD (post-traumatic stress disorder)   309.81 F43.10   2.  Dysthymic disorder   300.4 F34.1   3.  Dependent personality disorder   301.6 F60.7   4.  Chronic pain syndrome   338.4 G89.4  5.  Anxiety state   300.00 F41.1   6.  MULTIPLE SCLEROSIS, RELAPSING/REMITTING   340 G35   7.  Chondromalacia      Treatment Plan: Continue current therapy-[facility] will check on Abilify order FU 1 month  Darlyne Russian, PA-C

## 2015-01-29 ENCOUNTER — Ambulatory Visit (INDEPENDENT_AMBULATORY_CARE_PROVIDER_SITE_OTHER): Payer: No Typology Code available for payment source | Admitting: Family Medicine

## 2015-01-29 ENCOUNTER — Ambulatory Visit (INDEPENDENT_AMBULATORY_CARE_PROVIDER_SITE_OTHER): Payer: No Typology Code available for payment source | Admitting: Licensed Clinical Social Worker

## 2015-01-29 ENCOUNTER — Ambulatory Visit (INDEPENDENT_AMBULATORY_CARE_PROVIDER_SITE_OTHER): Payer: No Typology Code available for payment source | Admitting: Sports Medicine

## 2015-01-29 ENCOUNTER — Encounter: Payer: Self-pay | Admitting: Family Medicine

## 2015-01-29 ENCOUNTER — Encounter: Payer: Self-pay | Admitting: Sports Medicine

## 2015-01-29 VITALS — BP 127/75 | HR 98 | Wt 222.0 lb

## 2015-01-29 DIAGNOSIS — F331 Major depressive disorder, recurrent, moderate: Secondary | ICD-10-CM

## 2015-01-29 DIAGNOSIS — Z23 Encounter for immunization: Secondary | ICD-10-CM

## 2015-01-29 DIAGNOSIS — F4310 Post-traumatic stress disorder, unspecified: Secondary | ICD-10-CM

## 2015-01-29 DIAGNOSIS — R220 Localized swelling, mass and lump, head: Secondary | ICD-10-CM

## 2015-01-29 DIAGNOSIS — I10 Essential (primary) hypertension: Secondary | ICD-10-CM

## 2015-01-29 DIAGNOSIS — M1712 Unilateral primary osteoarthritis, left knee: Secondary | ICD-10-CM

## 2015-01-29 DIAGNOSIS — F341 Dysthymic disorder: Secondary | ICD-10-CM

## 2015-01-29 DIAGNOSIS — E876 Hypokalemia: Secondary | ICD-10-CM

## 2015-01-29 MED ORDER — HYDROCODONE-ACETAMINOPHEN 7.5-325 MG PO TABS: ORAL_TABLET | ORAL | Status: DC

## 2015-01-29 MED ORDER — DIAZEPAM 10 MG PO TABS: 5.0000 mg | ORAL_TABLET | Freq: Two times a day (BID) | ORAL | Status: DC | PRN

## 2015-01-29 NOTE — Progress Notes (Signed)
   Subjective:    Patient ID: Ashley Gomez, female    DOB: [birth year], 44 y.o.   MRN: [id number]  HPI Hypertension- Pt denies chest pain, SOB, dizziness, or heart palpitations.  Taking meds as directed w/o problems.  Denies medication side effects.    PTSD/MDD - she is seeing psychiatry now.  She hasn't started the lexapro yet. She has started CBT.  She says that the PA that she saw had recommended that she consider Depakote to help control her anxiety and possibly increasing her Valium. Though I was unable to find this in [facility] notes from last week. Feels like her anxiety and panic is really high. This month is the two-year anniversary of the death of her daughter and grandchild. She has been taking her valium and extra. Says she is already out but is due for her refill. Had a big fitght with her daughter she lives with yesterday.    Was seen 2 weeks ago for swelling on the face and a swollen gland with some dental pain. She has been on PCN and she is feeling better.  It felt numb but that is better. Hasn't followed up with the dentist.    Review of Systems     Objective:   Physical Exam  Constitutional: She is oriented to person, place, and time. She appears well-developed and well-nourished.  HENT:  Head: Normocephalic and atraumatic.  Right Ear: External ear normal.  Left Ear: External ear normal.  Eyes: Conjunctivae are normal.  Neck: Neck supple. No thyromegaly present.  Cardiovascular: Normal rate, regular rhythm and normal heart sounds.   Pulmonary/Chest: Effort normal and breath sounds normal.  Lymphadenopathy:    She has no cervical adenopathy.  Neurological: She is alert and oriented to person, place, and time.  Skin: Skin is warm and dry.  Psychiatric: She has a normal mood and affect. Her behavior is normal.          Assessment & Plan:  HTN - well controlled. F/U in 6 months.  Continue current regimen.blood work is up-to-date.  Hypokalemia-we will recheck her  potassium last time it looked great. She is now off of the K-Dur. We'll recheck today just to make sure that it's stable off of the extra potassium.  PTSD/MDD - she is seeing her therapist today. Discussed with her possibly asking for requesting a different psychiatrist. I can also contact Juanda Crumble to see if he had any other recommendations from me since I am the one writing her medications at this time.  Facial swelling - much improved today. She feels like it's better too. I was unable to palpate any swelling or swollen lymph nodes today which is very reassuring. I still encouraged her to follow with her dentist just to make sure that she's not having any dental concerns.

## 2015-01-29 NOTE — Assessment & Plan Note (Signed)
Doing well post arthroscopy. Still has some pain at the medial femoral condyle and referable to the patellofemoral joint as expected. Injection as above. Further follow-up with Dr. Erlinda Hong. She will probably need a knee arthroplasty in the next several years.

## 2015-01-29 NOTE — Progress Notes (Signed)
THERAPIST PROGRESS NOTE  Session Time: 1:00pm-2:00pm  Participation Level: Active  Behavioral Response: Disheveled Alert Tearful  Type of Therapy: Individual Therapy  Treatment Goals addressed: depression  Interventions: supportive   Suicidal/Homicidal: some SI without plan or intent, denies HI  Therapist Interventions:  Discussed how patient has been unsatisfied with her medication management.  Recommended she talk to the prescriber to express her thoughts and feelings and determine if she should transfer to a different Makaylen Thieme. Informed patient of an opportunity to participate in a weekly group focused on teaching mindfulness skills.  Recommended The Mindful Way Workbook as a resource to use in conjunction with the group.       Summary:  Reported that she has met with the PA 3 or 4 times and each time she has felt uncomfortable.  Indicated she feels he doesn't take her concerns seriously.  Agreeable to the idea of communicating these thoughts and feelings to him.  She will either wait to speak to him at her next appointment or call on a Thursday and request to speak to him.  Noted that she does not think her medication is helping.     Expressed interest in the group.  Plans to look for the workbook.   Reported having an increase in episodes of panic.  [name] they are happening 3-4 times a day.  Many of these episodes come on out of the blue.          Plan: Plans to attend first group this coming Wednesday.  Next individual therapy session is next Friday.   Diagnosis:Persistent Depressive Disorder   PTSD   [name] 01/29/2015

## 2015-01-29 NOTE — Progress Notes (Signed)
  Subjective:    CC:  Follow-up  HPI: Ashley Gomez is now over one month post operative left knee arthroscopy with several areas of grade 3 and 4 chondromalacia, overall she is continuing to feel better but still has some pain at the medial joint line, and under the patellofemoral joint. Mild, improving, no radiation, no constitutional symptoms, no swelling.  Past medical history, Surgical history, Family history not pertinant except as noted below, Social history, Allergies, and medications have been entered into the medical record, reviewed, and no changes needed.   Review of Systems: No fevers, chills, night sweats, weight loss, chest pain, or shortness of breath.   Objective:    General: Well Developed, well nourished, and in no acute distress.  Neuro: Alert and oriented x3, extra-ocular muscles intact, sensation grossly intact.  HEENT: Normocephalic, atraumatic, pupils equal round reactive to light, neck supple, no masses, no lymphadenopathy, thyroid nonpalpable.  Skin: Warm and dry, no rashes. Cardiac: Regular rate and rhythm, no murmurs rubs or gallops, no lower extremity edema.  Respiratory: Clear to auscultation bilaterally. Not using accessory muscles, speaking in full sentences. Left Knee: Normal to inspection with no erythema or effusion or obvious bony abnormalities. Still tender to palpation at the medial joint line. ROM normal in flexion and extension and lower leg rotation. Ligaments with solid consistent endpoints including ACL, PCL, LCL, MCL. Negative Mcmurray's and provocative meniscal tests. Non painful patellar compression. Patellar and quadriceps tendons unremarkable. Hamstring and quadriceps strength is normal.  Procedure: Real-time Ultrasound Guided Injection of left knee Device: GE Logiq E  Verbal informed consent obtained.  Time-out conducted.  Noted no overlying erythema, induration, or other signs of local infection.  Skin prepped in a sterile fashion.  Local  anesthesia: Topical Ethyl chloride.  With sterile technique and under real time ultrasound guidance:  2 mL Marcaine, 2 mL lidocaine, 1 mL kenalog 40 injected easily into the suprapatellar recess. Completed without difficulty  Pain immediately resolved suggesting accurate placement of the medication.  Advised to call if fevers/chills, erythema, induration, drainage, or persistent bleeding.  Images permanently stored and available for review in the ultrasound unit.  Impression: Technically successful ultrasound guided injection.  Impression and Recommendations:    I spent 25 minutes with this patient, greater than 50% was face-to-face time counseling regarding the above diagnoses

## 2015-01-30 LAB — POTASSIUM: Potassium: 3.7 mmol/L (ref 3.5–5.3)

## 2015-01-31 NOTE — Progress Notes (Signed)
Quick Note:  All labs are normal. ______ 

## 2015-02-03 ENCOUNTER — Ambulatory Visit (HOSPITAL_COMMUNITY): Payer: Self-pay | Admitting: Licensed Clinical Social Worker

## 2015-02-04 ENCOUNTER — Encounter: Payer: Self-pay | Admitting: Sports Medicine

## 2015-02-04 ENCOUNTER — Ambulatory Visit (INDEPENDENT_AMBULATORY_CARE_PROVIDER_SITE_OTHER): Payer: No Typology Code available for payment source

## 2015-02-04 ENCOUNTER — Ambulatory Visit (INDEPENDENT_AMBULATORY_CARE_PROVIDER_SITE_OTHER): Payer: No Typology Code available for payment source | Admitting: Sports Medicine

## 2015-02-04 VITALS — BP 140/80 | HR 100 | Ht 66.0 in | Wt 223.0 lb

## 2015-02-04 DIAGNOSIS — F331 Major depressive disorder, recurrent, moderate: Secondary | ICD-10-CM

## 2015-02-04 DIAGNOSIS — M4807 Spinal stenosis, lumbosacral region: Secondary | ICD-10-CM

## 2015-02-04 DIAGNOSIS — M5416 Radiculopathy, lumbar region: Secondary | ICD-10-CM

## 2015-02-04 DIAGNOSIS — M791 Myalgia: Secondary | ICD-10-CM

## 2015-02-04 DIAGNOSIS — M7918 Myalgia, other site: Secondary | ICD-10-CM

## 2015-02-04 DIAGNOSIS — M1712 Unilateral primary osteoarthritis, left knee: Secondary | ICD-10-CM

## 2015-02-04 MED ORDER — DULOXETINE HCL 30 MG PO CPEP: 30.0000 mg | ORAL_CAPSULE | Freq: Every day | ORAL | Status: DC

## 2015-02-04 NOTE — Assessment & Plan Note (Signed)
Discontinue wellbutrin, switching to cymbalta

## 2015-02-04 NOTE — Assessment & Plan Note (Signed)
Most likely L5 radiculopathy. Physical therapy, x-rays.

## 2015-02-04 NOTE — Progress Notes (Signed)
  Subjective:    CC: follow-up  HPI: Left knee osteoarthritis: Did okay post arthroscopy, we injected her knee at the last visit,She tells me she had temporary relief with a recurrence of pain.  Left leg pain: As above she describes the pain this time is paresthesias radiating over the anterolateral thigh, anterior knee, anterior lower leg and the top and bottom of the left foot. Also with pain in her back.  Mood disorder: Tearful in the exam room, currently on Abilify and Wellbutrin, she does endorse painful locations over her hips, back, shoulders.  Past medical history, Surgical history, Family history not pertinant except as noted below, Social history, Allergies, and medications have been entered into the medical record, reviewed, and no changes needed.   Review of Systems: No fevers, chills, night sweats, weight loss, chest pain, or shortness of breath.   Objective:    General: Well Developed, well nourished, and in no acute distress. Tearful in exam room. Neuro: Alert and oriented x3, extra-ocular muscles intact, sensation grossly intact. Multiple tender locations at riding his neck, shoulders, hips, and back. HEENT: Normocephalic, atraumatic, pupils equal round reactive to light, neck supple, no masses, no lymphadenopathy, thyroid nonpalpable.  Skin: Warm and dry, no rashes. Cardiac: Regular rate and rhythm, no murmurs rubs or gallops, no lower extremity edema.  Respiratory: Clear to auscultation bilaterally. Not using accessory muscles, speaking in full sentences. Left Knee: No effusion, tender to palpation at the joint line. ROM normal in flexion and extension and lower leg rotation. Ligaments with solid consistent endpoints including ACL, PCL, LCL, MCL. Negative Mcmurray's and provocative meniscal tests. Non painful patellar compression. Patellar and quadriceps tendons unremarkable. Hamstring and quadriceps strength is normal.  Impression and Recommendations:    I spent  25 minutes with this patient, greater than 50% was face-to-face time counseling regarding the above diagnoses

## 2015-02-04 NOTE — Assessment & Plan Note (Signed)
Continue gabapentin. I'm going to discontinue her Wellbutrin and switch to Cymbalta.  Return in one month.

## 2015-02-04 NOTE — Assessment & Plan Note (Signed)
Overall doing okay postinjection and postarthroscopy.

## 2015-02-05 ENCOUNTER — Ambulatory Visit (INDEPENDENT_AMBULATORY_CARE_PROVIDER_SITE_OTHER): Payer: No Typology Code available for payment source | Admitting: Licensed Clinical Social Worker

## 2015-02-05 DIAGNOSIS — F341 Dysthymic disorder: Secondary | ICD-10-CM

## 2015-02-05 DIAGNOSIS — F4310 Post-traumatic stress disorder, unspecified: Secondary | ICD-10-CM

## 2015-02-05 NOTE — Progress Notes (Signed)
THERAPIST PROGRESS NOTE  Session Time: 1:00pm-2:00pm  Participation Level: Active  Behavioral Response: Disheveled Alert Tearful  Type of Therapy: Individual Therapy  Treatment Goals addressed: depression  Interventions: supportive   Suicidal/Homicidal: some SI without plan or intent, denies HI  Therapist Interventions: Discussed how grandson has developed some behavioral problems within the past month and how it could be connected with the loss of his mother.  Shared information about a treatment option for him called Trauma-focused CBT.  Noted the importance of allowing for opportunities to talk about the loss and how it is affecting each person.   Had patient complete a PHQ-9 to assess for depressive symptoms.        Summary:  Reported that she was unable to attend the group this week because she had to pick up her grandson from daycare.  He has been acting out (pinching, throwing things, screaming).  Took him to the pediatrician who recommended counseling.   Continues to report that her daughter discourages outward expressions of grief.   PHQ=9 score was 24 (severe).  This is the same score as 3 weeks ago.       Plan: Still plans to participate in group therapy.  Next individual session is scheduled for next Thursday.  Diagnosis:Persistent Depressive Disorder   PTSD   [name] 02/05/2015

## 2015-02-10 ENCOUNTER — Ambulatory Visit (INDEPENDENT_AMBULATORY_CARE_PROVIDER_SITE_OTHER): Payer: No Typology Code available for payment source | Admitting: Licensed Clinical Social Worker

## 2015-02-10 DIAGNOSIS — F4310 Post-traumatic stress disorder, unspecified: Secondary | ICD-10-CM

## 2015-02-10 DIAGNOSIS — F341 Dysthymic disorder: Secondary | ICD-10-CM

## 2015-02-10 NOTE — Progress Notes (Signed)
THERAPIST PROGRESS NOTE  Session Time: 11:10am-12:10pm  Participation Level: Active  Behavioral Response: Disheveled Alert Anxious  Type of Therapy: Group Therapy  Treatment Goals addressed: depression  Interventions: MBCT   Suicidal/Homicidal: some SI without plan or intent, denies HI    Summary of Interventions: Introduced group members to Mindfulness-Based Cognitive Therapy (MBCT).  Provided a definition of the term mindfulness.   Reviewed group rules. Explained how trying to get rid of unpleasant feelings can actually keep those unpleasant feelings going.   Described two distinct modes of thinking, Doing Mode and Being Mode.  Explained how they differ from one another and provided examples of times when one mode of thinking may be more effective than the other.   Noted that practice of mindfulness is the way to cultivate the Being Mode of thinking.        Plan: Scheduled for individual therapy tomorrow.  Diagnosis:Persistent Depressive Disorder   PTSD   [name] 02/10/2015

## 2015-02-11 ENCOUNTER — Ambulatory Visit (INDEPENDENT_AMBULATORY_CARE_PROVIDER_SITE_OTHER): Payer: No Typology Code available for payment source | Admitting: Licensed Clinical Social Worker

## 2015-02-11 DIAGNOSIS — F341 Dysthymic disorder: Secondary | ICD-10-CM

## 2015-02-11 DIAGNOSIS — F4310 Post-traumatic stress disorder, unspecified: Secondary | ICD-10-CM

## 2015-02-11 DIAGNOSIS — R45851 Suicidal ideations: Secondary | ICD-10-CM
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
## 2015-02-12 ENCOUNTER — Ambulatory Visit (HOSPITAL_COMMUNITY): Payer: Self-pay | Admitting: Licensed Clinical Social Worker

## 2015-02-12 ENCOUNTER — Ambulatory Visit: Payer: Self-pay | Admitting: Osteopathic Medicine

## 2015-02-17 ENCOUNTER — Ambulatory Visit (INDEPENDENT_AMBULATORY_CARE_PROVIDER_SITE_OTHER): Payer: No Typology Code available for payment source | Admitting: Licensed Clinical Social Worker

## 2015-02-17 DIAGNOSIS — F341 Dysthymic disorder: Secondary | ICD-10-CM

## 2015-02-17 DIAGNOSIS — F4310 Post-traumatic stress disorder, unspecified: Secondary | ICD-10-CM

## 2015-02-18 ENCOUNTER — Ambulatory Visit (INDEPENDENT_AMBULATORY_CARE_PROVIDER_SITE_OTHER): Payer: No Typology Code available for payment source | Admitting: Rehabilitative and Restorative Service Providers"

## 2015-02-18 ENCOUNTER — Ambulatory Visit (INDEPENDENT_AMBULATORY_CARE_PROVIDER_SITE_OTHER): Payer: No Typology Code available for payment source | Admitting: Licensed Clinical Social Worker

## 2015-02-18 ENCOUNTER — Encounter: Payer: Self-pay | Admitting: Rehabilitative and Restorative Service Providers"

## 2015-02-18 DIAGNOSIS — R293 Abnormal posture: Secondary | ICD-10-CM

## 2015-02-18 DIAGNOSIS — F341 Dysthymic disorder: Secondary | ICD-10-CM

## 2015-02-18 DIAGNOSIS — R531 Weakness: Secondary | ICD-10-CM

## 2015-02-18 DIAGNOSIS — R29898 Other symptoms and signs involving the musculoskeletal system: Secondary | ICD-10-CM

## 2015-02-18 DIAGNOSIS — M2560 Stiffness of unspecified joint, not elsewhere classified: Secondary | ICD-10-CM

## 2015-02-18 DIAGNOSIS — R6889 Other general symptoms and signs: Secondary | ICD-10-CM

## 2015-02-18 DIAGNOSIS — M5442 Lumbago with sciatica, left side: Secondary | ICD-10-CM

## 2015-02-18 DIAGNOSIS — F4310 Post-traumatic stress disorder, unspecified: Secondary | ICD-10-CM

## 2015-02-18 DIAGNOSIS — Z7409 Other reduced mobility: Secondary | ICD-10-CM

## 2015-02-18 DIAGNOSIS — M623 Immobility syndrome (paraplegic): Secondary | ICD-10-CM

## 2015-02-18 NOTE — Progress Notes (Signed)
THERAPIST PROGRESS NOTE  Session Time: 1:05pm-2:00pm  Participation Level: Active  Behavioral Response: Disheveled Alert Anxious  Type of Therapy: Individual Therapy  Treatment Goals addressed: depression  Interventions: Mindfulness exercise   Suicidal/Homicidal: some SI without plan or intent, denies HI  Therapist Interventions:    Taught patient a focused breathing exercise.  Provided tips for practicing it most effectively.  Encouraged her to use the skill regularly in addition to times of distress. Talked about anxiety related to traveling a long distance.  Offered a couple of suggestions for coping during the ride.      Summary:  Reported that she has done some focused breathing during times when she is feeling especially anxious.  Said "I saw it on a movie once."  Acknowledged that doing it helps her to come out of the episode quicker. Planning to spend a few weeks with her twin sister.  Leaves on Oct 13th and will not return until early November.  [place] feeling [place].  Looking forward to spending quality time with her sister.   Plans to continue to practice skills she has learned about in therapy.         Plan: Will attend next group.  Next individual session will have to be when she returns from her trip.  Diagnosis:Persistent Depressive Disorder   PTSD   [name] 02/18/2015

## 2015-02-18 NOTE — Patient Instructions (Signed)
Myofacial ball release work sitting and standing  Working through [employer]; hip; hamstrings Lt   Provided info for purchase of TENS unit

## 2015-02-18 NOTE — Therapy (Signed)
[facility] [place], [place], [address] Phone: [phone]   Fax:  [phone]  Physical Therapy Evaluation  Patient Details  Name: Ashley Gomez MRN: [id number] Date of Birth: December 17, 1970 Referring Provider:  Silverio Decamp  Encounter Date: 02/18/2015      PT End of Session - 02/18/15 1523    Visit Number 1   Number of Visits 12   Date for PT Re-Evaluation 04/22/15  Pt to be out of town for 3 weeks - reassessment date adjusted to reflect pt being unable to attend PT for that time   PT Start Time 1431   PT Stop Time 1522   PT Time Calculation (min) 51 min   Activity Tolerance Patient tolerated treatment well      Past Medical History  Diagnosis Date  . Depression   . Anxiety   . MS (multiple sclerosis) ([place])   . Obesity   . Smoking   . Migraines   . Facet hypertrophy of lumbar region     MRI 2007  . DVT (deep venous thrombosis) ([place]) 49/17/9150    L basilic vein   . CVA (cerebral infarction)     Old CVA on MRI brain  . Hypertension     no meds now, lost 50lbs  . Sleep apnea     diagnosed 20 ago, lost wt no CPAP now  . PTSD (post-traumatic stress disorder)   . Seizures ([place]) 2011    no seizure since onset  . Neuromuscular disorder ([place])   . Fibromyalgia     Past Surgical History  Procedure Laterality Date  . Tubal ligation  1992  . Tonsillectomy    . Knee arthroscopy Left 12/30/2014    Procedure: LEFT KNEE ARTHROSCOPY WITH   CHONDROPLASTY;  Surgeon: Leandrew Koyanagi, MD;  Location: [facility];  Service: Orthopedics;  Laterality: Left;  . Chondroplasty Left 12/30/2014    Procedure: CHONDROPLASTY;  Surgeon: Leandrew Koyanagi, MD;  Location: [facility];  Service: Orthopedics;  Laterality: Left;    There were no vitals filed for this visit.  Visit Diagnosis:  Left-sided low back pain with left-sided sciatica - Plan: PT plan of care cert/re-cert  Weakness of  left leg - Plan: PT plan of care cert/re-cert  Stiffness due to immobility - Plan: PT plan of care cert/re-cert  Abnormal posture - Plan: PT plan of care cert/re-cert  Decreased strength, endurance, and mobility - Plan: PT plan of care cert/re-cert      Subjective Assessment - 02/18/15 1429    Subjective Pt reports that she just had arthroscopic surgery on her Lt knee 12/30/14 and continues to have knee pain. She has trochanteric bursitis in Lt hip and L5/S1 DDD mild to moderate. She has tingling into Lt LE on an intermittent basis and constant LBP. She is referred to PT for LE symtoms and LBP.   Pertinent History Fall onto both knees ~4 yr ago with second injur to Lt knee ~3 1/2 yr ago when a large container of ice cream fell striking Lt knee. She was seen in PT but symptoms persisted. History of LBP for >20 years with symptoms gradually increasing.    How long can you sit comfortably? 15-20 min   How long can you stand comfortably? 15-20 min   How long can you walk comfortably? 15-20 min   Diagnostic tests xrays - DDD    Patient Stated Goals Be in less pain  Currently in Pain? Yes   Pain Score 6    Pain Location Back   Pain Orientation Mid;Left   Pain Descriptors / Indicators Burning;Pins and needles   Pain Type Chronic pain   Pain Radiating Towards radiating ont the Lt hip   Pain Onset 1 to 4 weeks ago  flare up ~2 weeks ago   Aggravating Factors  sitting/standing/walking long periods of time; reaching; lifting   Pain Relieving Factors meds, hot shower, heating pad ease symptoms temporarily            [facility] PT Assessment - 02/18/15 0001    Assessment   Medical Diagnosis Lt lumbar radiculopathy    Onset Date/Surgical Date 01/29/15   Hand Dominance Right   Next MD Visit 03/31/15   Prior Therapy knee 3 1/2 yr ago   Precautions   Precautions None   Balance Screen   Has the patient fallen in the past 6 months Yes   How many times? 1  fall over a puppy landing on all 4's  just before symptoms    Has the patient had a decrease in activity level because of a fear of falling?  No   Is the patient reluctant to leave their home because of a fear of falling?  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   Vocation Requirements household chores; child care for grandchildren   Leisure computer/TV some - sits and stares a lot    Observation/Other Assessments   Focus on Therapeutic Outcomes (FOTO)  50% limitation   Sensation   Additional Comments pins and needles hip to foot Lt intermittently depends on activity level - worse with prolonged positions    AROM   Right/Left Hip --  Lt hip flex 80 deg    Right/Left Knee --  WFL's bilat   Lumbar Flexion 75%   Lumbar Extension 50%  pull mid LB   Lumbar - Right Side Bend 75%   Lumbar - Left Side Bend 65%  "pull" Lt side   Lumbar - Right Rotation 45%   Lumbar - Left Rotation 40%   Strength   Overall Strength Comments Rt LE 5/5 except hip abd 5-/5; ext 4+/5. Lt hip abd 4-/5; sxt 4/5    Flexibility   Hamstrings Rt 76 deg; 75 deg with LBP/Lt hip pain   Quadriceps heel to buttock Rt 7 in; Lt 8 in    ITB tight Lt >> Rt   Piriformis tight Lt w/ pain in Lt hip    Palpation   Spinal mobility pain with CPA pressure sacrum through lumbar wetebrae   Palpation comment tenderness and tightness through Lt >> Rt lumbar paraspinals; QL; lats; piriformis; hip abductors    Functional Gait  Assessment   Gait assessed  --  antalgic gait dec wt bearing Lt LE; limb Lt LE                   [facility] Adult PT Treatment/Exercise - 02/18/15 0001    Posture/Postural Control   Posture Comments sits with weight shifted to Rt heel raised with toe on floor Lt LE; stands with weight shifted to Rt, Lt LE ER; hips flexed   Therapeutic Activites    Therapeutic Activities --  instructed  in myofacial ball release work   Moist Heat Therapy   Number Minutes Moist Heat  15 Minutes   Moist Heat Location Lumbar Spine;Hip   Electrical Stimulation   Electrical Stimulation Location bilat L5; Lt hip   Electrical Stimulation Action IFC   Electrical Stimulation Parameters to tolerance   Electrical Stimulation Goals Pain                PT Education - 02/18/15 1517    Education provided Yes   Education Details postural education; education re exercise/myofacial pain; info for TENS unit   Person(s) Educated Patient   Methods Explanation;Demonstration;Tactile cues;Verbal cues   Comprehension Verbalized understanding;Returned demonstration;Verbal cues required;Tactile cues required             PT Long Term Goals - 02/18/15 1530    PT LONG TERM GOAL #1   Title Patient I in HEP 04/22/15   Time 9  6 wk of therapy - out of town for 3 weeks   Period Weeks   Status New   PT LONG TERM GOAL #2   Title Improve ROM/ mobility trunk and Lt hip to [facility] 04/23/15   Time 9   Period Weeks   Status New   PT LONG TERM GOAL #3   Title Improve posture and alignment - patient to stand with equal weight bearing bilat LE's 04/23/15   Time 9   Period Weeks   Status New   PT LONG TERM GOAL #4   Title 4+/5 to 5/5 strength bilat LE's 04/23/15   Time 9   Period Weeks   Status New   PT LONG TERM GOAL #5   Title Improve FOTO to </= 39% limitation 04/23/15   Time 9   Period Weeks   Status New               Plan - 02/18/15 1524    Clinical Impression Statement Patient presents with pain in Lt LB; Lt hip and Lt knee which is longstanding in nature but pt has experienced flare up of symptoms in the past 2 weeks. She has abnormal posture and alignment; decreased trunk and LE ROM/mobility; decreased strength Lt LE; tenderness to palpation through LB/Lt LE; abnormal gait pattern; limited functional activity level. She will benefit from Physical Therapy to address problems  identified and improve functioinal activity level.    Pt will benefit from skilled therapeutic intervention in order to improve on the following deficits Postural dysfunction;Improper body mechanics;Increased fascial restricitons;Decreased range of motion;Decreased mobility;Decreased strength;Decreased endurance;Decreased activity tolerance;Pain   Rehab Potential Good   PT Frequency 2x / week   PT Duration 6 weeks   PT Treatment/Interventions Patient/family education;ADLs/Self Care Home Management;Therapeutic activities;Therapeutic exercise;Manual techniques;Neuromuscular re-education;Dry needling;Cryotherapy;Electrical Stimulation;Moist Heat;Ultrasound;Traction   PT Next Visit Plan progress with core stabilization and stretching program - patient unable to stay for exercise today due to time constraints. Progress with lumbar stabilization as indicated. Address LE strengthening.   PT Home Exercise Plan Myofacial ball release work; TENS unit   Consulted and Agree with Plan of Care Patient         Problem List Patient Active Problem List   Diagnosis Date Noted  . Dependent personality disorder 12/03/2014  . Chronic pain syndrome 12/03/2014  . Myofascial pain 11/09/2014  . Dysthymic disorder 10/15/2014  . PTSD (post-traumatic stress disorder) 10/15/2014  . Superficial thrombophlebitis 09/15/2013  . Menopausal and perimenopausal disorder 10/04/2012  . Primary osteoarthritis of left knee 12/21/2011  . Lateral epicondylitis of right elbow 10/18/2010  . B12 DEFICIENCY 06/21/2010  . UNSPECIFIED ANEMIA 06/21/2010  . NECK PAIN, CHRONIC 06/21/2010  .  VARICOSE VEINS, LOWER EXTREMITIES 05/17/2010  . ABSENCE OF MENSTRUATION 05/17/2010  . POSTURAL LIGHTHEADEDNESS 05/17/2010  . NUMBNESS, ARM 04/04/2010  . DVT 01/13/2010  . SEIZURE DISORDER 01/13/2010  . IRREGULAR MENSES 06/01/2009  . Depression, major, recurrent, moderate ([place]) 05/16/2009  . OBESITY, UNSPECIFIED 03/09/2009  . Anxiety state  03/09/2009  . CIGARETTE SMOKER 03/09/2009  . MULTIPLE SCLEROSIS, RELAPSING/REMITTING 03/09/2009  . MIGRAINE WITHOUT AURA 03/09/2009  . ESSENTIAL HYPERTENSION, BENIGN 03/09/2009  . Left lumbar radiculopathy 03/09/2009    Adilen Pavelko Nilda Simmer PT, MPH 02/18/2015, 4:42 PM  [facility] [place], [place], [address] Phone: [phone]   Fax:  [phone]

## 2015-02-18 NOTE — Progress Notes (Signed)
THERAPIST PROGRESS NOTE  Session Time: 70:34KB-52:48LY  Participation Level: Active  Behavioral Response: Casual   Alert  Anxious  Type of Therapy: Group Therapy  Treatment Goals addressed: depression  Interventions: MBCT   Suicidal/Homicidal: Denied both    Summary of Interventions:   Reviewed concept of mindfulness.  Talked about how living on automatic pilot puts [name] at risk of getting stuck in negative states of mind.   Practiced two mindfulness exercises: mindful eating and the body scan.  Encouraged group members to describe their experiences.   Explained how you can practice mindfulness by bringing awareness to routine activities.   Assigned homework to: . choose one routine activity and perform it in a mindful way . practice the body scan once a day . practice eating mindfully at least once a day         Plan:  Planning to return for group next week.  Diagnosis:      PTSD                          Persistent Depressive Disorder     [name] 02/17/2015

## 2015-02-22 ENCOUNTER — Ambulatory Visit (INDEPENDENT_AMBULATORY_CARE_PROVIDER_SITE_OTHER): Payer: No Typology Code available for payment source | Admitting: Physical Therapy

## 2015-02-22 ENCOUNTER — Telehealth: Payer: Self-pay | Admitting: Family Medicine

## 2015-02-22 DIAGNOSIS — M2560 Stiffness of unspecified joint, not elsewhere classified: Secondary | ICD-10-CM

## 2015-02-22 DIAGNOSIS — R29898 Other symptoms and signs involving the musculoskeletal system: Secondary | ICD-10-CM

## 2015-02-22 DIAGNOSIS — M5442 Lumbago with sciatica, left side: Secondary | ICD-10-CM

## 2015-02-22 DIAGNOSIS — R531 Weakness: Secondary | ICD-10-CM

## 2015-02-22 DIAGNOSIS — R293 Abnormal posture: Secondary | ICD-10-CM

## 2015-02-22 DIAGNOSIS — Z7409 Other reduced mobility: Secondary | ICD-10-CM

## 2015-02-22 DIAGNOSIS — R6889 Other general symptoms and signs: Secondary | ICD-10-CM

## 2015-02-22 DIAGNOSIS — M623 Immobility syndrome (paraplegic): Secondary | ICD-10-CM

## 2015-02-22 NOTE — Patient Instructions (Signed)
  Abdominal Bracing With Pelvic Floor (Hook-Lying)   With neutral spine, tighten pelvic floor and abdominals. Hold 10 seconds. Repeat __10_ times. Do _1__ times a day.  Tighten abdominals. Knee to Chest: Transverse Plane Stability   Bring one knee up, then return. Be sure pelvis does not roll side to side. Keep pelvis still. Lift knee __10_ times each leg. Restabilize pelvis. Repeat with other leg. Do _1-2__ sets, _1__ times per day.             Hip External Rotation With Pillow: Transverse Plane Stability  Tighten abdominals One knee bent, one leg straight, on pillow. Slowly roll bent knee out. Be sure pelvis does not rotate. Do _10__ times. Restabilize pelvis. Repeat with other leg. Do _1-2__ sets, _1__ times per day.  Heel Slide: 4-10 Inches - Transverse Plane Stability  Tighten abdominals Slide heel 4 inches down. Be sure pelvis does not rotate. Do _10__ times. Restabilize pelvis. Repeat with other leg. Do __1_ sets, _1__ times per day.  KNEE: Quadriceps - Prone    Place strap around ankle. Bring ankle toward buttocks. Press hip into surface. Hold _30__ seconds. __2-3_ reps per set, _1_ sets per day, _5__ days per week   Copyright  VHI. All rights reserved.   [facility] [place] [place], [place] [address]  [phone] (office) [phone] (fax)

## 2015-02-22 NOTE — Telephone Encounter (Signed)
Okay to fill the hydrocodone and the Valium. Behavioral Health does write her Neurontin.

## 2015-02-22 NOTE — Therapy (Addendum)
[place], [place], [address] Phone: [phone]   Fax:  [phone]  Physical Therapy Treatment  Patient Details  Name: Ashley Gomez MRN: [id number] Date of Birth: [birth year] Referring Provider:  Silverio Decamp,*  Encounter Date: 02/22/2015      PT End of Session - 02/22/15 1102    Visit Number 2   Number of Visits 12   Date for PT Re-Evaluation 04/22/15  see eval for additional details.    PT Start Time 1102   PT Stop Time 1202   PT Time Calculation (min) 60 min      Past Medical History  Diagnosis Date  . Depression   . Anxiety   . MS (multiple sclerosis) ([place])   . Obesity   . Smoking   . Migraines   . Facet hypertrophy of lumbar region     MRI 2007  . DVT (deep venous thrombosis) ([place]) 63/87/5643    L basilic vein   . CVA (cerebral infarction)     Old CVA on MRI brain  . Hypertension     no meds now, lost 50lbs  . Sleep apnea     diagnosed 20 ago, lost wt no CPAP now  . PTSD (post-traumatic stress disorder)   . Seizures ([place]) 2011    no seizure since onset  . Neuromuscular disorder ([place])   . Fibromyalgia     Past Surgical History  Procedure Laterality Date  . Tubal ligation  1992  . Tonsillectomy    . Knee arthroscopy Left 12/30/2014    Procedure: LEFT KNEE ARTHROSCOPY WITH   CHONDROPLASTY;  Surgeon: Leandrew Koyanagi, MD;  Location: [facility];  Service: Orthopedics;  Laterality: Left;  . Chondroplasty Left 12/30/2014    Procedure: CHONDROPLASTY;  Surgeon: Leandrew Koyanagi, MD;  Location: [facility];  Service: Orthopedics;  Laterality: Left;    There were no vitals filed for this visit.  Visit Diagnosis:  Left-sided low back pain with left-sided sciatica  Weakness of left leg  Stiffness due to immobility  Decreased strength, endurance, and mobility  Abnormal posture      Subjective Assessment - 02/22/15 1105    Subjective Pt  reports she had 12/10 pain yesterday in back.  Took medicine to decrease pain.  This morning, pain more "tolerable" after a hot shower.    Currently in Pain? Yes   Pain Score 4    Pain Location Back   Pain Orientation Mid;Left   Pain Descriptors / Indicators Burning   Pain Radiating Towards radiating to Lt knee    Aggravating Factors  prolonged sitting/standing    Pain Relieving Factors medication, hot showers, heating pad             [facility] PT Assessment - 02/22/15 0001    Assessment   Medical Diagnosis Lt lumbar radiculopathy    Onset Date/Surgical Date 01/29/15   Hand Dominance Right   Next MD Visit 03/31/15   Sensation   Light Touch --  point tender to light touch to Lt LB and posterior/lat hip   Flexibility   Quadriceps heel to buttock >8 "           [facility] Adult PT Treatment/Exercise - 02/22/15 0001    Transfers   Comments Pt educated to engage core muscles during transitions.  Pt able to return demo 1x.    Exercises   Exercises Lumbar;Knee/Hip   Lumbar Exercises: Stretches   Passive  Hamstring Stretch 1 rep;30 seconds   Lower Trunk Rotation --  10 reps, gentle   Prone on Elbows Stretch 60 seconds;4 reps   Press Ups 2 reps   Press Ups Limitations unable to tolerate due to increased pain   ITB Stretch 1 rep;10 seconds   ITB Stretch Limitations unable to tolerate, supine with strap   Lumbar Exercises: Aerobic   Stationary Bike NuStep L3: 4.5 min    Lumbar Exercises: Standing   Other Standing Lumbar Exercises MFR with ball to Lt glute x 2 min    Lumbar Exercises: Supine   Ab Set 5 reps;5 seconds  3 part core, educated on form   Clam 5 reps  with ab set   Clam Limitations VC for form   Bent Knee Raise 10 reps;1 second  with ab set   Lumbar Exercises: Prone   Other Prone Lumbar Exercises Trial of MFR with ball to Lt quad; pt unable to tolerate.    Knee/Hip Exercises: Stretches   Sports administrator Left;3 reps;30 seconds   Sports administrator Limitations (prone with  strap)   Moist Heat Therapy   Number Minutes Moist Heat 15 Minutes   Moist Heat Location Lumbar Spine   Electrical Stimulation   Electrical Stimulation Location bilat L5; Lt hip   Electrical Stimulation Action premod to each area   Electrical Stimulation Parameters to tolerance   Electrical Stimulation Goals Pain                PT Education - 02/22/15 1205    Education provided Yes   Education Details HEP: trans abd series (excluding heel slides-not taught yet) and quad stretch; RICE to Lt knee to decrease edema and pain.    Person(s) Educated Patient   Methods Explanation;Handout   Comprehension Verbalized understanding;Returned demonstration             PT Long Term Goals - 02/18/15 1530    PT LONG TERM GOAL #1   Title Patient I in HEP 04/22/15   Time 9  6 wk of therapy - out of town for 3 weeks   Period Weeks   Status New   PT LONG TERM GOAL #2   Title Improve ROM/ mobility trunk and Lt hip to [facility] 04/23/15   Time 9   Period Weeks   Status New   PT LONG TERM GOAL #3   Title Improve posture and alignment - patient to stand with equal weight bearing bilat LE's 04/23/15   Time 9   Period Weeks   Status New   PT LONG TERM GOAL #4   Title 4+/5 to 5/5 strength bilat LE's 04/23/15   Time 9   Period Weeks   Status New   PT LONG TERM GOAL #5   Title Improve FOTO to </= 39% limitation 04/23/15   Time 9   Period Weeks   Status New               Plan - 02/22/15 1146    Clinical Impression Statement Pt continues with Lt LB pain, as well has Lt knee and hip pain;  decreased tolerance to position changes and exercises. Pt stands with Lt knee flexed (position of comfort, per pt).  Pt demo tightness in Lt quad in prone. Pt able to engage core muscles with tactile/VC.  Pt reported decrease in LBP during estim/heat, limited carry over of relief at end of treatment.    Pt will benefit from skilled therapeutic intervention in order to improve  on the following  deficits Postural dysfunction;Improper body mechanics;Increased fascial restricitons;Decreased range of motion;Decreased mobility;Decreased strength;Decreased endurance;Decreased activity tolerance;Pain   Rehab Potential Good   PT Frequency 2x / week   PT Duration 6 weeks   PT Treatment/Interventions Patient/family education;ADLs/Self Care Home Management;Therapeutic activities;Therapeutic exercise;Manual techniques;Neuromuscular re-education;Dry needling;Cryotherapy;Electrical Stimulation;Moist Heat;Ultrasound;Traction   PT Next Visit Plan Progress lumbar stabilization and LE stretching program.  Pt to wear athletic attire for ease with addressing hip/knee and increased support to feet.    Consulted and Agree with Plan of Care Patient        Problem List Patient Active Problem List   Diagnosis Date Noted  . Dependent personality disorder 12/03/2014  . Chronic pain syndrome 12/03/2014  . Myofascial pain 11/09/2014  . Dysthymic disorder 10/15/2014  . PTSD (post-traumatic stress disorder) 10/15/2014  . Superficial thrombophlebitis 09/15/2013  . Menopausal and perimenopausal disorder 10/04/2012  . Primary osteoarthritis of left knee 12/21/2011  . Lateral epicondylitis of right elbow 10/18/2010  . B12 DEFICIENCY 06/21/2010  . UNSPECIFIED ANEMIA 06/21/2010  . NECK PAIN, CHRONIC 06/21/2010  . VARICOSE VEINS, LOWER EXTREMITIES 05/17/2010  . ABSENCE OF MENSTRUATION 05/17/2010  . POSTURAL LIGHTHEADEDNESS 05/17/2010  . NUMBNESS, ARM 04/04/2010  . DVT 01/13/2010  . SEIZURE DISORDER 01/13/2010  . IRREGULAR MENSES 06/01/2009  . Depression, major, recurrent, moderate ([place]) 05/16/2009  . OBESITY, UNSPECIFIED 03/09/2009  . Anxiety state 03/09/2009  . CIGARETTE SMOKER 03/09/2009  . MULTIPLE SCLEROSIS, RELAPSING/REMITTING 03/09/2009  . MIGRAINE WITHOUT AURA 03/09/2009  . ESSENTIAL HYPERTENSION, BENIGN 03/09/2009  . Left lumbar radiculopathy 03/09/2009    Kerin Perna,  PTA 02/22/2015 12:35 PM  [facility] [place], [place], [address] Phone: [phone]   Fax:  [phone]     PHYSICAL THERAPY DISCHARGE SUMMARY  Visits from Start of Care: 2  Current functional level related to goals / functional outcomes: unknown   Remaining deficits: unchanged   Education / Equipment: HEP  Plan: Patient agrees to discharge.  Patient goals were not met. Patient is being discharged due to not returning since the last visit.  ?????     Celyn P. Helene Kelp PT, MPH 04/16/2015 1:40 PM

## 2015-02-22 NOTE — Telephone Encounter (Signed)
Patient is going out of town for 3 weeks and wants to come by Wed morning to pick up rx's since she has appts in the bldg.  She needs refills on Valium and Hydrocodone.  She is not sure if you or Dr. Rubye Beach write for her Neurontin, but she needs refill on that.  Thanks

## 2015-02-23 MED ORDER — HYDROCODONE-ACETAMINOPHEN 7.5-325 MG PO TABS: ORAL_TABLET | ORAL | Status: DC

## 2015-02-23 MED ORDER — DIAZEPAM 10 MG PO TABS: 5.0000 mg | ORAL_TABLET | Freq: Two times a day (BID) | ORAL | Status: DC | PRN

## 2015-02-23 NOTE — Telephone Encounter (Signed)
Pain med and valium placed in box up front. Please call pt and inform her to call [facility] for neurontin refill.Audelia Hives Wardner

## 2015-02-23 NOTE — Telephone Encounter (Signed)
Refilled the hydrocodone and valuim. Patient will need to get the Neurontin from [facility].

## 2015-02-24 ENCOUNTER — Ambulatory Visit (INDEPENDENT_AMBULATORY_CARE_PROVIDER_SITE_OTHER): Payer: No Typology Code available for payment source | Admitting: Licensed Clinical Social Worker

## 2015-02-24 ENCOUNTER — Encounter: Payer: Self-pay | Admitting: Rehabilitative and Restorative Service Providers"

## 2015-02-24 ENCOUNTER — Ambulatory Visit (HOSPITAL_COMMUNITY): Payer: Self-pay | Admitting: Licensed Clinical Social Worker

## 2015-02-24 ENCOUNTER — Telehealth (HOSPITAL_COMMUNITY): Payer: Self-pay | Admitting: *Deleted

## 2015-02-24 DIAGNOSIS — F4310 Post-traumatic stress disorder, unspecified: Secondary | ICD-10-CM

## 2015-02-24 DIAGNOSIS — F341 Dysthymic disorder: Secondary | ICD-10-CM

## 2015-02-24 NOTE — Telephone Encounter (Signed)
Spoke with Judson Roch from the patient assistance program. Per Judson Roch, medication shipment for Abilify was suppose to ship on 02/17/15. She contact the office after she figures out what happen to the shipment and if she needs a new prescription. I will notify the patient about the prescription status.

## 2015-02-25 ENCOUNTER — Ambulatory Visit (HOSPITAL_COMMUNITY): Payer: Self-pay | Admitting: Licensed Clinical Social Worker

## 2015-02-25 NOTE — Progress Notes (Signed)
THERAPIST PROGRESS NOTE  Session Time: [id number]  Participation Level: Active  Behavioral Response: Disheveled Alert Anxious  Type of Therapy: Group Therapy  Treatment Goals addressed: depression  Interventions: MBCT   Suicidal/Homicidal: some SI without plan or intent, denies HI    Summary of Interventions: Asked group members to describe their experiences practicing the body scan and performing a task in a mindful way. Differentiated between "two ways of knowing."  Thinking about something versus tuning in directly to it.  Illustrated this concept by playing a recording which guided group members to think about their feet and then sense their feet directly. Explained that it is our thoughts about a situation that determine how we end up feeling rather than the situation itself.  Talked about how it can be helpful to become more mindful of our thoughts and how they are affecting [name].   Assigned homework to: . practice the body scan once a day . practice mindful breathing . choose another routine activity to perform in a mindful way         Plan: Is going out of town for a few weeks.  Will return for group in early November.  Diagnosis:Persistent Depressive Disorder   PTSD   [name] 02/24/2015

## 2015-03-02 ENCOUNTER — Ambulatory Visit (HOSPITAL_COMMUNITY): Payer: Self-pay | Admitting: Licensed Clinical Social Worker

## 2015-03-02 ENCOUNTER — Ambulatory Visit: Payer: Self-pay | Admitting: Sports Medicine

## 2015-03-03 ENCOUNTER — Other Ambulatory Visit (HOSPITAL_COMMUNITY): Payer: Self-pay | Admitting: Medical

## 2015-03-03 ENCOUNTER — Ambulatory Visit (HOSPITAL_COMMUNITY): Payer: Self-pay | Admitting: Licensed Clinical Social Worker

## 2015-03-03 ENCOUNTER — Ambulatory Visit: Payer: Self-pay | Admitting: Sports Medicine

## 2015-03-04 ENCOUNTER — Ambulatory Visit (HOSPITAL_COMMUNITY): Payer: Self-pay | Admitting: Licensed Clinical Social Worker

## 2015-03-10 ENCOUNTER — Ambulatory Visit (HOSPITAL_COMMUNITY): Payer: Self-pay | Admitting: Licensed Clinical Social Worker

## 2015-03-11 ENCOUNTER — Ambulatory Visit (HOSPITAL_COMMUNITY): Payer: Self-pay | Admitting: Licensed Clinical Social Worker

## 2015-03-11 ENCOUNTER — Ambulatory Visit (HOSPITAL_COMMUNITY): Payer: Self-pay | Admitting: Medical

## 2015-03-17 ENCOUNTER — Ambulatory Visit (HOSPITAL_COMMUNITY): Payer: Self-pay | Admitting: Licensed Clinical Social Worker

## 2015-03-19 ENCOUNTER — Telehealth (HOSPITAL_COMMUNITY): Payer: Self-pay | Admitting: *Deleted

## 2015-03-22 ENCOUNTER — Telehealth (HOSPITAL_COMMUNITY): Payer: Self-pay | Admitting: *Deleted

## 2015-03-22 NOTE — Telephone Encounter (Signed)
Received Abilify 5mg , #60 from [facility]. Called and informed pt medication is ready for pickup. Pt states she will pick up later today.

## 2015-03-23 ENCOUNTER — Ambulatory Visit: Payer: Self-pay | Admitting: Sports Medicine

## 2015-03-24 ENCOUNTER — Ambulatory Visit (HOSPITAL_COMMUNITY): Payer: Self-pay | Admitting: Licensed Clinical Social Worker

## 2015-03-25 ENCOUNTER — Ambulatory Visit (INDEPENDENT_AMBULATORY_CARE_PROVIDER_SITE_OTHER): Payer: Medicare Other | Admitting: Licensed Clinical Social Worker

## 2015-03-25 DIAGNOSIS — F4310 Post-traumatic stress disorder, unspecified: Secondary | ICD-10-CM | POA: Diagnosis not present

## 2015-03-25 DIAGNOSIS — F341 Dysthymic disorder: Secondary | ICD-10-CM | POA: Diagnosis not present

## 2015-03-25 NOTE — Progress Notes (Signed)
THERAPIST PROGRESS NOTE  Session Time: 1:00pm-1:55pm  Participation Level: Active  Behavioral Response: Disheveled Alert Tearful  Type of Therapy: Individual Therapy  Treatment Goals addressed: depression  Interventions:    Suicidal/Homicidal: some SI without plan or intent, denies HI  Therapist Interventions:    Gathered information about changes in mood and functioning during the time patient was visiting her sister in [place].   Had patient complete a PHQ-9 and GAD-7 to assess for depression and anxiety severity.   Discussed how grandson continues to have behavioral problems.  Shared a book for kids about trauma and its effects.      Summary:   PHQ-9 score was 15 (moderately severe).  GAD-7 score was 13 (moderately severe).  This is an improvement.   Reported that time with her sister was enjoyable, but most of the time she was bored because her sister and brother-in-law were working during the day.  She would keep busy by cleaning their apartment.   Has had a lot of hip and knee pain.  Had to make frequent stops on the drive home due to being in pain so what would have been a 7 hour drive turned into 12 hours.   Worried about her grandson who was kicked out of daycare.  Thinks his behavioral problems are related to witnessing the traumatic death of his mother.  After therapist read aloud from the book she said, "That's exactly what my grandson is going through."  She may look into buying the book and reading it to him.              Plan: Scheduled to return next week.  Will return to skills training.   Diagnosis:Persistent Depressive Disorder   PTSD   [name] 03/25/2015

## 2015-03-31 ENCOUNTER — Encounter: Payer: Self-pay | Admitting: Sports Medicine

## 2015-03-31 ENCOUNTER — Ambulatory Visit (INDEPENDENT_AMBULATORY_CARE_PROVIDER_SITE_OTHER): Payer: Self-pay | Admitting: Family Medicine

## 2015-03-31 ENCOUNTER — Ambulatory Visit (HOSPITAL_COMMUNITY): Payer: Self-pay | Admitting: Licensed Clinical Social Worker

## 2015-03-31 ENCOUNTER — Encounter: Payer: Self-pay | Admitting: Family Medicine

## 2015-03-31 ENCOUNTER — Ambulatory Visit (INDEPENDENT_AMBULATORY_CARE_PROVIDER_SITE_OTHER): Payer: Self-pay | Admitting: Sports Medicine

## 2015-03-31 VITALS — BP 124/85 | HR 86 | Temp 97.5°F | Ht 66.0 in | Wt 242.0 lb

## 2015-03-31 VITALS — BP 124/85 | HR 86 | Temp 97.5°F | Resp 18 | Wt 242.0 lb

## 2015-03-31 DIAGNOSIS — M7918 Myalgia, other site: Secondary | ICD-10-CM

## 2015-03-31 DIAGNOSIS — M5416 Radiculopathy, lumbar region: Secondary | ICD-10-CM

## 2015-03-31 DIAGNOSIS — F17200 Nicotine dependence, unspecified, uncomplicated: Secondary | ICD-10-CM

## 2015-03-31 DIAGNOSIS — M542 Cervicalgia: Secondary | ICD-10-CM

## 2015-03-31 DIAGNOSIS — M7062 Trochanteric bursitis, left hip: Secondary | ICD-10-CM

## 2015-03-31 DIAGNOSIS — M1712 Unilateral primary osteoarthritis, left knee: Secondary | ICD-10-CM

## 2015-03-31 DIAGNOSIS — Z79899 Other long term (current) drug therapy: Secondary | ICD-10-CM

## 2015-03-31 DIAGNOSIS — I10 Essential (primary) hypertension: Secondary | ICD-10-CM

## 2015-03-31 DIAGNOSIS — M791 Myalgia: Secondary | ICD-10-CM

## 2015-03-31 DIAGNOSIS — F331 Major depressive disorder, recurrent, moderate: Secondary | ICD-10-CM

## 2015-03-31 MED ORDER — BUPROPION HCL ER (XL) 300 MG PO TB24: 300.0000 mg | ORAL_TABLET | Freq: Every day | ORAL | Status: DC

## 2015-03-31 MED ORDER — DULOXETINE HCL 60 MG PO CPEP: 60.0000 mg | ORAL_CAPSULE | Freq: Every day | ORAL | Status: DC

## 2015-03-31 MED ORDER — DIAZEPAM 10 MG PO TABS: 5.0000 mg | ORAL_TABLET | Freq: Two times a day (BID) | ORAL | Status: DC | PRN

## 2015-03-31 MED ORDER — HYDROCODONE-ACETAMINOPHEN 7.5-325 MG PO TABS: ORAL_TABLET | ORAL | Status: DC

## 2015-03-31 NOTE — Assessment & Plan Note (Signed)
Per patient request restart Wellbutrin

## 2015-03-31 NOTE — Assessment & Plan Note (Signed)
Recurrence of pain post arthroscopy, at this point she does need to consider total knee arthroplasty. She will make another appointment with her surgeon. Thoroughly there is a great deal of depression, and her pain will never be fully controlled until her depression is controlled.

## 2015-03-31 NOTE — Assessment & Plan Note (Signed)
Significant improvement with formal physical therapy. Continue gabapentin, rehabilitation exercises.

## 2015-03-31 NOTE — Progress Notes (Signed)
   Subjective:    Patient ID: Ashley Gomez, female    DOB: 1970-06-14, 44 y.o.   MRN: [id number]  HPI Hypertension- Pt denies chest pain, SOB, dizziness, or heart palpitations.  Taking meds as directed w/o problems.  Denies medication side effects.    Chronic pain management for her back pain. She is in PT for her back. She is likely going ot have a knee replacement as well.    Depression - she is now on Cymbalta. She is now on 60mg  as of today. She is still greiving. She doesn't cry as much. Her daughter who has passed away has really been on her mind lately.     Review of Systems     Objective:   Physical Exam  Constitutional: She is oriented to person, place, and time. She appears well-developed and well-nourished.  HENT:  Head: Normocephalic and atraumatic.  Cardiovascular: Normal rate, regular rhythm and normal heart sounds.   Pulmonary/Chest: Effort normal and breath sounds normal.  Neurological: She is alert and oriented to person, place, and time.  Skin: Skin is warm and dry.  Psychiatric: She has a normal mood and affect. Her behavior is normal.          Assessment & Plan:  HTN - well controlled on current regimen. Continue current regimen.  Chronic pain management for her back pain, knee, pain and fibromyalgia-she is currently on Cymbalta. Dr. Dianah Field increased her dose to 60 mg today so she will start that soon. We did go ahead and fill her chronic narcotic pain regimen today. We did not adjust her dose. She did complete a pain contract. Also collected urine specimen for urine drug screen today. We had long discussion today about looking at reducing her pain medications at some point in the future. She is getting PT for her back and will likely have a knee replacement in the next year. Once that happens and if her pain is under somewhat better control then we really need to look at reducing the dose and/or quantity on her chronic narcotic regimen.  Depression-I  think overall she is doing better. She still going through a significant grieving process. We had a long discussion today about getting back on track with the normalcy of life including doing her hair putting on makeup etc. And that by doing so it is not betraying her feelings and should not really cause skilled. We did increase her Cymbalta today. Next  Time spent 30 minutes, greater than 50% time spent counseling about pain management, depression, grieving, hypertension.

## 2015-03-31 NOTE — Assessment & Plan Note (Signed)
Good response to Cymbalta, increasing to 60 mg.

## 2015-03-31 NOTE — Progress Notes (Signed)
  Subjective:    CC: Follow-up  HPI: Lumbar radiculopathy: Improved with physical therapy, still gets some discomfort on long drives but otherwise much better.  Depression: Improved on Cymbalta, does desire to go back on Wellbutrin for smoking cessation.  Left knee osteoarthritis: Need to follow-up with orthopedic surgeon, persistent pain despite viscous supplementation and arthroscopic chondroplasty and meniscectomy.  Left hip pain: Severe, localized in the lateral hip, no radiation.  Past medical history, Surgical history, Family history not pertinant except as noted below, Social history, Allergies, and medications have been entered into the medical record, reviewed, and no changes needed.   Review of Systems: No fevers, chills, night sweats, weight loss, chest pain, or shortness of breath.   Objective:    General: Well Developed, well nourished, and in no acute distress.  Neuro: Alert and oriented x3, extra-ocular muscles intact, sensation grossly intact.  HEENT: Normocephalic, atraumatic, pupils equal round reactive to light, neck supple, no masses, no lymphadenopathy, thyroid nonpalpable.  Skin: Warm and dry, no rashes. Cardiac: Regular rate and rhythm, no murmurs rubs or gallops, no lower extremity edema.  Respiratory: Clear to auscultation bilaterally. Not using accessory muscles, speaking in full sentences. Left Hip: ROM IR: 60 Deg, ER: 60 Deg, Flexion: 120 Deg, Extension: 100 Deg, Abduction: 45 Deg, Adduction: 45 Deg Strength IR: 5/5, ER: 5/5, Flexion: 5/5, Extension: 5/5, Abduction: 5/5, Adduction: 5/5 Pelvic alignment unremarkable to inspection and palpation. Standing hip rotation and gait without trendelenburg / unsteadiness. Greater trochanter with severe tenderness to palpation. No tenderness over piriformis. No SI joint tenderness and normal minimal SI movement.  Procedure:  Injection of left trochanteric bursa Consent obtained and verified. Time-out  conducted. Noted no overlying erythema, induration, or other signs of local infection. Skin prepped in a sterile fashion. Topical analgesic spray: Ethyl chloride. Completed without difficulty. Meds: Spinal needle advanced to the greater trochanter and 1 mL kenalog 40, 4 mL lidocaine injected easily. Pain immediately improved suggesting accurate placement of the medication. Advised to call if fevers/chills, erythema, induration, drainage, or persistent bleeding.  Impression and Recommendations:

## 2015-03-31 NOTE — Assessment & Plan Note (Addendum)
Injection as above, rehabilitation exercises given, return in 4 weeks.

## 2015-04-01 LAB — DRUG SCR UR, PAIN MGMT, REFLEX CONF
Amphetamine Screen, Ur: NEGATIVE
Barbiturate Quant, Ur: NEGATIVE
Cocaine Metabolites: NEGATIVE
Creatinine,U: 18.24 mg/dL
Marijuana Metabolite: NEGATIVE
Methadone: NEGATIVE
Opiates: NEGATIVE
Phencyclidine (PCP): NEGATIVE
Propoxyphene: NEGATIVE

## 2015-04-02 ENCOUNTER — Telehealth: Payer: Self-pay | Admitting: Emergency Medicine

## 2015-04-02 NOTE — Telephone Encounter (Signed)
Patient called to leave message for Dr.T: she called Dr.XU and cannot be appointed with him until her [facility] income defer letter comes through, but she is in process; also wants him to know she once again got "softball size lump over hip injection site that she is using ice on. Wants to know if there is anything else she should do. pak

## 2015-04-04 LAB — BENZODIAZEPINES (GC/LC/MS), URINE
Alprazolam metabolite (GC/LC/MS), ur confirm: NEGATIVE ng/mL (ref <25)
Clonazepam metabolite (GC/LC/MS), ur confirm: NEGATIVE ng/mL (ref <25)
Flurazepam metabolite (GC/LC/MS), ur confirm: NEGATIVE ng/mL (ref <50)
Lorazepam (GC/LC/MS), ur confirm: NEGATIVE ng/mL (ref <50)
Midazolam (GC/LC/MS), ur confirm: NEGATIVE ng/mL (ref <50)
Nordiazepam (GC/LC/MS), ur confirm: 241 ng/mL — ABNORMAL HIGH (ref <50)
Oxazepam (GC/LC/MS), ur confirm: 560 ng/mL — ABNORMAL HIGH (ref <50)
Temazepam (GC/LC/MS), ur confirm: 811 ng/mL — ABNORMAL HIGH (ref <50)
Triazolam metabolite (GC/LC/MS), ur confirm: NEGATIVE ng/mL (ref <50)

## 2015-04-04 LAB — OPIATES/OPIOIDS (LC/MS-MS)
Codeine Urine: NEGATIVE ng/mL (ref <50)
Hydrocodone: 139 ng/mL — ABNORMAL HIGH (ref <50)
Hydromorphone: NEGATIVE ng/mL (ref <50)
Morphine Urine: NEGATIVE ng/mL (ref <50)
Norhydrocodone, Ur: 374 ng/mL — ABNORMAL HIGH (ref <50)
Noroxycodone, Ur: NEGATIVE ng/mL (ref <50)
Oxycodone, ur: NEGATIVE ng/mL (ref <50)
Oxymorphone: NEGATIVE ng/mL (ref <50)

## 2015-04-07 ENCOUNTER — Ambulatory Visit (HOSPITAL_COMMUNITY): Payer: Self-pay | Admitting: Licensed Clinical Social Worker

## 2015-04-14 ENCOUNTER — Ambulatory Visit (INDEPENDENT_AMBULATORY_CARE_PROVIDER_SITE_OTHER): Payer: Medicare Other | Admitting: Licensed Clinical Social Worker

## 2015-04-14 ENCOUNTER — Ambulatory Visit (HOSPITAL_COMMUNITY): Payer: Self-pay | Admitting: Licensed Clinical Social Worker

## 2015-04-14 DIAGNOSIS — F341 Dysthymic disorder: Secondary | ICD-10-CM | POA: Diagnosis not present

## 2015-04-14 DIAGNOSIS — F4310 Post-traumatic stress disorder, unspecified: Secondary | ICD-10-CM

## 2015-04-14 NOTE — Progress Notes (Signed)
THERAPIST PROGRESS NOTE  Session Time: [id number]  Participation Level: Active  Behavioral Response: Disheveled Alert Tearful  Type of Therapy: Individual Therapy  Treatment Goals addressed: increase ability to tolerate PTSD symptoms  Interventions: Treatment planning   Suicidal/Homicidal: some SI without plan or intent, denies HI  Therapist Interventions:  Reviewed treatment plan.  Determined that patient did not achieve her goal to reduce severity of depressive symptoms. Acknowledged progress patient has made despite not achieving this goal.       Collaborated with patient to develop a new treatment plan.  Identified a long term goal and some short term goals.        Summary:  Depression screen [facility] 2/9 04/14/2015 03/25/2015 02/05/2015 01/15/2015 12/01/2014  Decreased Interest 3 2 3 2 2   Down, Depressed, Hopeless 3 2 3 3 3   PHQ - 2 Score 6 4 6 5 5   Altered sleeping 3 2 3 3 3   Tired, decreased energy 3 2 3 3 3   Change in appetite 3 2 3 3 3   Feeling bad or failure about yourself  3 1 2 3 3   Trouble concentrating 3 2 3 3 3   Moving slowly or fidgety/restless 3 2 2 3 3   Suicidal thoughts 2 0 2 1 2   PHQ-9 Score 26 15 24 24 25   Difficult doing work/chores Very difficult - Somewhat difficult Somewhat difficult Extremely dIfficult   Reported an increase in depressive symptoms over the past couple of weeks.  Identifies the holiday season as a contributing factor.  Having daily crying spells.  Also noted she continues to have a lot of difficulties with her memory.   Agreed with suggestions to revise her treatment plan to reflect her goals related to regaining functioning and ability to cope with PTSD symptoms.     Plan: Scheduled to return in one week.    Diagnosis:Persistent Depressive Disorder   PTSD   Ashley Gomez 04/14/2015

## 2015-04-19 ENCOUNTER — Ambulatory Visit (HOSPITAL_COMMUNITY): Payer: Self-pay | Admitting: Licensed Clinical Social Worker

## 2015-04-21 ENCOUNTER — Ambulatory Visit (INDEPENDENT_AMBULATORY_CARE_PROVIDER_SITE_OTHER): Payer: Medicare Other | Admitting: Licensed Clinical Social Worker

## 2015-04-21 ENCOUNTER — Other Ambulatory Visit (HOSPITAL_COMMUNITY): Payer: Self-pay | Admitting: Medical

## 2015-04-21 ENCOUNTER — Ambulatory Visit (HOSPITAL_COMMUNITY): Payer: Self-pay | Admitting: Licensed Clinical Social Worker

## 2015-04-21 DIAGNOSIS — F341 Dysthymic disorder: Secondary | ICD-10-CM

## 2015-04-21 DIAGNOSIS — F4310 Post-traumatic stress disorder, unspecified: Secondary | ICD-10-CM
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
## 2015-04-22 ENCOUNTER — Ambulatory Visit (INDEPENDENT_AMBULATORY_CARE_PROVIDER_SITE_OTHER): Payer: Medicare Other | Admitting: Medical

## 2015-04-22 ENCOUNTER — Encounter (HOSPITAL_COMMUNITY): Payer: Self-pay | Admitting: Medical

## 2015-04-22 VITALS — BP 128/70 | HR 90 | Ht 66.0 in | Wt 237.0 lb

## 2015-04-22 DIAGNOSIS — F331 Major depressive disorder, recurrent, moderate: Secondary | ICD-10-CM

## 2015-04-22 DIAGNOSIS — F341 Dysthymic disorder: Secondary | ICD-10-CM

## 2015-04-22 DIAGNOSIS — F4321 Adjustment disorder with depressed mood: Secondary | ICD-10-CM

## 2015-04-22 DIAGNOSIS — F4310 Post-traumatic stress disorder, unspecified: Secondary | ICD-10-CM | POA: Diagnosis not present

## 2015-04-22 DIAGNOSIS — Z634 Disappearance and death of family member: Secondary | ICD-10-CM

## 2015-04-22 DIAGNOSIS — F4329 Adjustment disorder with other symptoms: Secondary | ICD-10-CM

## 2015-04-22 MED ORDER — GABAPENTIN 400 MG PO CAPS: ORAL_CAPSULE | ORAL | Status: DC

## 2015-04-22 MED ORDER — TOPIRAMATE 100 MG PO TABS: ORAL_TABLET | ORAL | Status: DC

## 2015-04-22 MED ORDER — ARIPIPRAZOLE 10 MG PO TABS: 10.0000 mg | ORAL_TABLET | Freq: Two times a day (BID) | ORAL | Status: DC
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
## 2015-04-28 ENCOUNTER — Ambulatory Visit (HOSPITAL_COMMUNITY): Payer: Self-pay | Admitting: Licensed Clinical Social Worker

## 2015-04-29 ENCOUNTER — Ambulatory Visit (INDEPENDENT_AMBULATORY_CARE_PROVIDER_SITE_OTHER): Payer: Medicare Other | Admitting: Licensed Clinical Social Worker

## 2015-04-29 ENCOUNTER — Other Ambulatory Visit: Payer: Self-pay

## 2015-04-29 DIAGNOSIS — F341 Dysthymic disorder: Secondary | ICD-10-CM | POA: Diagnosis not present

## 2015-04-29 DIAGNOSIS — F4310 Post-traumatic stress disorder, unspecified: Secondary | ICD-10-CM | POA: Diagnosis not present

## 2015-04-29 MED ORDER — HYDROCODONE-ACETAMINOPHEN 7.5-325 MG PO TABS: ORAL_TABLET | ORAL | Status: DC
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
## 2015-04-30 ENCOUNTER — Ambulatory Visit (INDEPENDENT_AMBULATORY_CARE_PROVIDER_SITE_OTHER): Payer: Self-pay | Admitting: Sports Medicine

## 2015-04-30 ENCOUNTER — Encounter: Payer: Self-pay | Admitting: Sports Medicine

## 2015-04-30 ENCOUNTER — Telehealth (HOSPITAL_COMMUNITY): Payer: Self-pay | Admitting: *Deleted

## 2015-04-30 VITALS — BP 131/77 | HR 78 | Temp 97.6°F | Wt 242.0 lb

## 2015-04-30 DIAGNOSIS — M7062 Trochanteric bursitis, left hip: Secondary | ICD-10-CM

## 2015-04-30 DIAGNOSIS — R112 Nausea with vomiting, unspecified: Secondary | ICD-10-CM

## 2015-04-30 MED ORDER — ONDANSETRON HCL 4 MG PO TABS
8.0000 mg | ORAL_TABLET | Freq: Once | ORAL | Status: AC
  Administered 2015-04-30: 8 mg via ORAL

## 2015-04-30 NOTE — Assessment & Plan Note (Signed)
Resolved after injection at the last visit, overall doing well with psychotropics, further follow-up with psychiatry and PCP.  She does need some financial aid before she can continue with [facility] and knee arthroplasty.

## 2015-04-30 NOTE — Progress Notes (Signed)
  Subjective:    CC: Follow-up  HPI: Trochanteric bursitis, left: Resolved after injection at the last visit. Her depression is also improving with the increase in Cymbalta and she has stopped smoking on Wellbutrin. Regarding her knee arthritis she is still waiting for financial aid before she can proceed with total arthroplasty with [facility].  She did take all of her medications on an empty stomach today and is feeling a bit nauseated. Zofran will be given.  Past medical history, Surgical history, Family history not pertinant except as noted below, Social history, Allergies, and medications have been entered into the medical record, reviewed, and no changes needed.   Review of Systems: No fevers, chills, night sweats, weight loss, chest pain, or shortness of breath.   Objective:    General: Well Developed, well nourished, and in no acute distress.  Neuro: Alert and oriented x3, extra-ocular muscles intact, sensation grossly intact.  HEENT: Normocephalic, atraumatic, pupils equal round reactive to light, neck supple, no masses, no lymphadenopathy, thyroid nonpalpable.  Skin: Warm and dry, no rashes. Cardiac: Regular rate and rhythm, no murmurs rubs or gallops, no lower extremity edema.  Respiratory: Clear to auscultation bilaterally. Not using accessory muscles, speaking in full sentences.  Impression and Recommendations:

## 2015-04-30 NOTE — Telephone Encounter (Signed)
Pt contact office because refill for Abilify 10mg  was not received. Rx for Abilify 10mg , #60 was faxed on 04/22/15. Received confirmation Rx was received on 04/22/15.  Called pt assistance @ [phone]. Spoke with Celene Kras. Per Celene Kras, medication will be shipped on 05/03/15, medication will arrive in office by 05/05/15. Will call and notify pt of Rx status.

## 2015-05-04 ENCOUNTER — Ambulatory Visit (HOSPITAL_COMMUNITY): Payer: Self-pay | Admitting: Psychiatry

## 2015-05-05 ENCOUNTER — Ambulatory Visit (HOSPITAL_COMMUNITY): Payer: Self-pay | Admitting: Licensed Clinical Social Worker

## 2015-05-12 ENCOUNTER — Ambulatory Visit (INDEPENDENT_AMBULATORY_CARE_PROVIDER_SITE_OTHER): Payer: Medicare Other | Admitting: Licensed Clinical Social Worker

## 2015-05-12 ENCOUNTER — Ambulatory Visit (HOSPITAL_COMMUNITY): Payer: Self-pay | Admitting: Licensed Clinical Social Worker

## 2015-05-12 DIAGNOSIS — F4310 Post-traumatic stress disorder, unspecified: Secondary | ICD-10-CM | POA: Diagnosis not present

## 2015-05-12 DIAGNOSIS — F341 Dysthymic disorder: Secondary | ICD-10-CM
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
## 2015-05-28 ENCOUNTER — Ambulatory Visit (INDEPENDENT_AMBULATORY_CARE_PROVIDER_SITE_OTHER): Payer: Medicare Other | Admitting: Licensed Clinical Social Worker

## 2015-05-28 DIAGNOSIS — F4310 Post-traumatic stress disorder, unspecified: Secondary | ICD-10-CM | POA: Diagnosis not present

## 2015-05-28 DIAGNOSIS — F341 Dysthymic disorder: Secondary | ICD-10-CM

## 2015-05-28 NOTE — Progress Notes (Signed)
THERAPIST PROGRESS NOTE  Session Time: 11:00- 12:05pm  Participation Level: Active  Behavioral Response: Casual Alert Depressed  Type of Therapy: Individual Therapy  Treatment Goals addressed: increase ability to tolerate PTSD symptoms  Interventions: Mindfulness   Suicidal/Homicidal: some SI without plan or intent, denies HI  Therapist Interventions:   Discussed how patient has concerns about continuing therapy while this therapist will be out on maternity leave starting in May.  Acknowledged they will have to consider pros and cons of her working with another therapist temporarily.   Gathered information about current symptoms and how she has been coping with them.   Decided to make her a list of mindfulness exercises she can practice when she finds herself stuck on unhelpful thoughts.  Practiced one exercise called Focus on an Object. Assigned homework to practice at least one mindfulness exercise each day for at least 10 minutes.   Also asked that patient attempt assignment from last time about describing her safe place.  Therapist wrote down these assignments as patient said, "If it isn't written down I'll forget it."      Summary:   Again forgot to bring in completed homework.  Reported doing only about 3 entries on the Self Monitoring of Feelings Form.  Reported getting only 3 hours of sleep last night and having a vague sense that "something bad has happened."  Admitted that she hasn't been taking her sleep meds because of a fear of not waking up if her grand kids should need her.  Acknowledged that she needs to let her daughter and son-in-law assume responsibility for the kids because she plans to move out if and when she gets approved for disability benefits.  Noted she has a hearing at the end of March.  Indicated she intends to start taking her sleep medication. Talked about how she has trouble "turning her mind off."  Agreed that practice of mindfulness in different forms  could be helpful when she catches herself stuck on unhelpful thoughts.        Plan:  Scheduled to return next week.  Will introduce the idea of scheduling pleasant activities.   Diagnosis:Persistent Depressive Disorder   PTSD   Ashley Gomez 05/28/2015

## 2015-05-31 ENCOUNTER — Encounter: Payer: Self-pay | Admitting: Family Medicine

## 2015-05-31 ENCOUNTER — Ambulatory Visit (INDEPENDENT_AMBULATORY_CARE_PROVIDER_SITE_OTHER): Payer: Self-pay | Admitting: Family Medicine

## 2015-05-31 VITALS — BP 130/86 | HR 88 | Wt 252.0 lb

## 2015-05-31 DIAGNOSIS — F341 Dysthymic disorder: Secondary | ICD-10-CM

## 2015-05-31 DIAGNOSIS — R635 Abnormal weight gain: Secondary | ICD-10-CM

## 2015-05-31 DIAGNOSIS — G43911 Migraine, unspecified, intractable, with status migrainosus: Secondary | ICD-10-CM

## 2015-05-31 DIAGNOSIS — G35 Multiple sclerosis: Secondary | ICD-10-CM

## 2015-05-31 MED ORDER — KETOROLAC TROMETHAMINE 60 MG/2ML IM SOLN
60.0000 mg | Freq: Once | INTRAMUSCULAR | Status: AC
  Administered 2015-05-31: 60 mg via INTRAMUSCULAR

## 2015-05-31 MED ORDER — DIAZEPAM 10 MG PO TABS: 5.0000 mg | ORAL_TABLET | Freq: Two times a day (BID) | ORAL | Status: DC | PRN

## 2015-05-31 MED ORDER — HYDROCODONE-ACETAMINOPHEN 7.5-325 MG PO TABS: ORAL_TABLET | ORAL | Status: DC

## 2015-05-31 NOTE — Progress Notes (Addendum)
   Subjective:    Patient ID: Ashley Gomez, female    DOB: 1970-06-24, 45 y.o.   MRN: [id number]  HPI  She is very concerned about her recent weight gain. She has gained about 10 lbs since I last her. She is not exercising. She only eats once a day. She has been trying to eat salad. She doesn't drink soda.  Says mostly drinks water and coffee.   Last TSH was normal. She is in menopause and wonders if that could be contributing..    She is having more frequent HA and some vision changes in her periphery. Has had to sue her Imitrex the last 3 days.  Only has 2 imitrex left.  He has been under a little bit more stress recently.  Dysthymic d/o  - on cymbalta and valium PRN. Currently going to regular therapy but is worried because her therapist is pregnant and will be out for 3 months. She's worried about establishing with someone new while she is out. She complains of feeling nervous and on edge nearly every day and difficult to controlling her worry. She also complains of feeling down and depressed nearly every day as well as extreme fatigue and difficulty with sleep. She also reports difficulty with concentration.  Review of Systems     Objective:   Physical Exam  Constitutional: She is oriented to person, place, and time. She appears well-developed and well-nourished.  HENT:  Head: Normocephalic and atraumatic.  Cardiovascular: Normal rate, regular rhythm and normal heart sounds.   Pulmonary/Chest: Effort normal and breath sounds normal.  Neurological: She is alert and oriented to person, place, and time.  Skin: Skin is warm and dry.  Psychiatric: She has a normal mood and affect. Her behavior is normal.          Assessment & Plan:  Acute status migrainous - given 60mg  IM toradol injection today. Can take Zofran once home. Go home and rest. Call if not better in 24 hours. Continue Topamax and magnesium supplement.   Abnormal weight gain - could be from the abilify versus thyroid  d/o versus poor dietary habits. She only eats once a day. Work on spreading out once calories and eating throughout the day.. Offered to check her thyroid but she declined today and says she will think about it. Encouraged her to exercise regularly which she is not currently doing. This would help with her mood and with help with the need for weight loss.  Dysthymic D/O- GAD- 7 score of 19 and PHQ- 9 score of 23.  Not well controlled but I really think she would benefit from continued therapy. She is oriented Cymbalta and Wellbutrin.  Multiple sclerosis-she's starting to have problems with her peripheral vision again. She was previously seen by Dr. Merleen Nicely in [place] and then seen here by a provider at [facility] neurology. She says she can't with her name but thinks she has it written down at home. She's not been seen in a couple of years because she's been without insurance. In fact retractor for her last year but they had difficulty getting records and so she was never scheduled. We'll try to refer her to go for neurology for further evaluation. She says her original diagnosis was made at [place] about 11 years ago. She had an MRI done at that time. He was not able to get the original records. Evidently they have been destroyed but was able to get a copy of the original MRI.

## 2015-06-03 ENCOUNTER — Ambulatory Visit (INDEPENDENT_AMBULATORY_CARE_PROVIDER_SITE_OTHER): Payer: Medicare Other | Admitting: Licensed Clinical Social Worker

## 2015-06-03 DIAGNOSIS — F4321 Adjustment disorder with depressed mood: Secondary | ICD-10-CM | POA: Diagnosis not present

## 2015-06-03 DIAGNOSIS — F341 Dysthymic disorder: Secondary | ICD-10-CM

## 2015-06-03 DIAGNOSIS — Z634 Disappearance and death of family member: Secondary | ICD-10-CM

## 2015-06-03 DIAGNOSIS — F4329 Adjustment disorder with other symptoms: Secondary | ICD-10-CM

## 2015-06-03 DIAGNOSIS — F4310 Post-traumatic stress disorder, unspecified: Secondary | ICD-10-CM

## 2015-06-03 NOTE — Progress Notes (Signed)
THERAPIST PROGRESS NOTE  Session Time: 11:00- 11:55am  Participation Level: Active  Behavioral Response: Casual Alert Depressed and tearful  Type of Therapy: Individual Therapy  Treatment Goals addressed: increase ability to tolerate PTSD symptoms  Interventions: Activity scheduling, building emotional awareness   Suicidal/Homicidal: some SI without plan or intent, denies HI  Therapist Interventions:   Asked about implementation of mindfulness skills.    Talked about how building emotional awareness includes experiencing positive emotions.  Noted that individuals with PTSD have a tendency to numb their emotions and this includes positive ones in addition to painful ones.  Asked patient to identify activities she gets some enjoyment from and also activities she used to enjoy.  Reviewed a list of suggested pleasurable activities.  Assigned homework to schedule a pleasant event at least once a week.  Summary:  Reported that she has been practicing the mindfulness skills some and "it has helped a little." In the past week she said she hasn't been doing much because she hasn't been feeling well. Noted that overall things in the house are going okay and there has been less arguing. Activities she has been able to enjoy lately are primarily associated with spending time with her grandchildren. Identified reading, going to the park, listening to country music, and camping as activities she used to enjoy. Agreed to schedule pleasant activities at least once a week.      Plan:  Scheduled to return January 27th.  Will introduce concept of distress tolerance.  Diagnosis:Persistent Depressive Disorder   PTSD   [name] 06/03/2015

## 2015-06-08 ENCOUNTER — Telehealth (HOSPITAL_COMMUNITY): Payer: Self-pay | Admitting: *Deleted

## 2015-06-08 NOTE — Telephone Encounter (Signed)
Pt called to check on Abilify medication. PT states she has not received refill for this month. Informed pt, I will check on medication and return call. Pt verbalizes understanding.

## 2015-06-11 ENCOUNTER — Ambulatory Visit (INDEPENDENT_AMBULATORY_CARE_PROVIDER_SITE_OTHER): Payer: Medicare Other | Admitting: Licensed Clinical Social Worker

## 2015-06-11 DIAGNOSIS — F4310 Post-traumatic stress disorder, unspecified: Secondary | ICD-10-CM | POA: Diagnosis not present

## 2015-06-11 DIAGNOSIS — F4329 Adjustment disorder with other symptoms: Secondary | ICD-10-CM

## 2015-06-11 DIAGNOSIS — F341 Dysthymic disorder: Secondary | ICD-10-CM | POA: Diagnosis not present

## 2015-06-11 DIAGNOSIS — F4321 Adjustment disorder with depressed mood: Secondary | ICD-10-CM

## 2015-06-11 DIAGNOSIS — Z634 Disappearance and death of family member: Principal | ICD-10-CM

## 2015-06-14 NOTE — Progress Notes (Signed)
THERAPIST PROGRESS NOTE  Session Time: 1:20- 2:05pm  Participation Level: Active  Behavioral Response: Casual Alert Depressed and tearful  Type of Therapy: Individual Therapy  Treatment Goals addressed: increase ability to tolerate PTSD symptoms  Interventions: Supportive counseling   Suicidal/Homicidal: some SI without plan or intent, denies HI  Therapist Interventions:  Processed thoughts and feelings about information patient read about in the medical report from her daughter's death.  She had never read the report before.  Agreed with patient's plan to further process the content with her aunt, someone she says she trusts to be supportive yet impartial.     Summary: Reported that she read the report that morning.  Indicated her decision to do so was spontaneous.  Came away from it believing that "they (the doctors) killed her daughter." They apparently administered a drug several times which is not supposed to be used on women who are pregnant.  Was able to acknowledge that she needs to review the report with someone who can be objective before making any final conclusions.  Reported regretting her decision to read it.    Plan:  Will introduce concept of distress tolerance.  Diagnosis: Complicated Grief  Persistent Depressive Disorder  PTSD   [name] 06/11/2015

## 2015-06-15 NOTE — Telephone Encounter (Signed)
Return telephone call to patient. Pt voice concerns about medication shipment for Abilify. Informed pt I have spoke with Mickel Baas from [facility], medication refill for Abilify 10mg  has been shipped and will arrive at the office within 3 business days. Informed pt of prescription status. Pt verbalizes understanding.

## 2015-06-16 ENCOUNTER — Telehealth: Payer: Self-pay

## 2015-06-16 DIAGNOSIS — M7918 Myalgia, other site: Secondary | ICD-10-CM

## 2015-06-16 MED ORDER — CYCLOBENZAPRINE HCL 10 MG PO TABS: 10.0000 mg | ORAL_TABLET | Freq: Three times a day (TID) | ORAL | Status: DC

## 2015-06-16 NOTE — Telephone Encounter (Signed)
Yes that's fine 

## 2015-06-17 ENCOUNTER — Telehealth (HOSPITAL_COMMUNITY): Payer: Self-pay | Admitting: *Deleted

## 2015-06-17 NOTE — Telephone Encounter (Signed)
Called and informed pt prescription for Abilify is ready for pickup.

## 2015-06-21 ENCOUNTER — Ambulatory Visit (HOSPITAL_COMMUNITY): Payer: Self-pay | Admitting: Licensed Clinical Social Worker

## 2015-06-24 ENCOUNTER — Ambulatory Visit (INDEPENDENT_AMBULATORY_CARE_PROVIDER_SITE_OTHER): Payer: Medicare Other | Admitting: Medical

## 2015-06-24 ENCOUNTER — Encounter (HOSPITAL_COMMUNITY): Payer: Self-pay | Admitting: Medical

## 2015-06-24 VITALS — BP 126/70 | HR 86 | Wt 250.0 lb

## 2015-06-24 DIAGNOSIS — F4310 Post-traumatic stress disorder, unspecified: Secondary | ICD-10-CM

## 2015-06-24 DIAGNOSIS — Z634 Disappearance and death of family member: Secondary | ICD-10-CM

## 2015-06-24 DIAGNOSIS — F4321 Adjustment disorder with depressed mood: Secondary | ICD-10-CM | POA: Diagnosis not present

## 2015-06-24 DIAGNOSIS — F341 Dysthymic disorder: Secondary | ICD-10-CM

## 2015-06-24 DIAGNOSIS — F4329 Adjustment disorder with other symptoms: Secondary | ICD-10-CM

## 2015-06-24 MED ORDER — CLONAZEPAM 1 MG PO TABS: 1.0000 mg | ORAL_TABLET | Freq: Three times a day (TID) | ORAL | Status: DC

## 2015-06-24 MED ORDER — TOPIRAMATE 100 MG PO TABS: ORAL_TABLET | ORAL | Status: DC

## 2015-06-24 MED ORDER — GABAPENTIN 400 MG PO CAPS: ORAL_CAPSULE | ORAL | Status: DC

## 2015-06-24 NOTE — Progress Notes (Signed)
[facility] MD/PA/NP OP Progress Note  06/24/2015 10:30 am TOMISHA KLEIN  MRN:  [id number]  Subjective: "I'm doing better" Chief Complaint:  Chief Complaint    Follow-up; Anxiety; Stress; Trauma; Grief     Visit Diagnosis:     ICD-9-CM ICD-10-CM   1. Dysthymic disorder 300.4 F34.1 topiramate (TOPAMAX) 100 MG tablet  2. PTSD (post-traumatic stress disorder) 309.81 F43.10 topiramate (TOPAMAX) 100 MG tablet  3. Complicated grief Q000111Q Q000111Q topiramate (TOPAMAX) 100 MG tablet    Past Medical History:  Past Medical History  Diagnosis Date  . Depression   . Anxiety   . MS (multiple sclerosis) ([place])   . Obesity   . Smoking   . Migraines   . Facet hypertrophy of lumbar region     MRI 2007  . DVT (deep venous thrombosis) ([place]) [id number]    L basilic vein   . CVA (cerebral infarction)     Old CVA on MRI brain  . Hypertension     no meds now, lost 50lbs  . Sleep apnea     diagnosed 20 ago, lost wt no CPAP now  . PTSD (post-traumatic stress disorder)   . Seizures ([place]) 2011    no seizure since onset  . Neuromuscular disorder ([place])   . Fibromyalgia     Past Surgical History  Procedure Laterality Date  . Tubal ligation  1992  . Tonsillectomy    . Knee arthroscopy Left 12/30/2014    Procedure: LEFT KNEE ARTHROSCOPY WITH   CHONDROPLASTY;  Surgeon: Leandrew Koyanagi, MD;  Location: [facility];  Service: Orthopedics;  Laterality: Left;  . Chondroplasty Left 12/30/2014    Procedure: CHONDROPLASTY;  Surgeon: Leandrew Koyanagi, MD;  Location: [facility];  Service: Orthopedics;  Laterality: Left;   Family History:  Family History  Problem Relation Age of Onset  . Cancer Mother 72    melanoma  . Hypertension Sister   . Heart disease Sister     valve disease  . Depression Sister   . Diabetes Sister   . Sjogren's syndrome Sister    Social History:  Social History   Social History  . Marital Status: Single    Spouse Name: N/A  . Number of Children: N/A  . Years  of Education: N/A   Social History Main Topics  . Smoking status: Current Every Day Smoker -- 1.00 packs/day for 25 years    Types: Cigarettes  . Smokeless tobacco: Never Used     Comment: advised to d/c by 3 cigs per week.  . Alcohol Use: No     Comment: occasional  . Drug Use: No  . Sexual Activity: No   Other Topics Concern  . None   Social History Narrative   Additional History: Garnette Scheuermann, LCSW Signed Garnette Scheuermann, LCSW 06/03/2015 12:02 PM      **Sensitive Note**     Progress Notes      THERAPIST PROGRESS NOTE  Session Time: 11:00- 11:55am  Participation Level: Active  Behavioral Response: Casual Alert Depressed and tearful  Type of Therapy: Individual Therapy  Treatment Goals addressed: increase ability to tolerate PTSD symptoms Interventions: Activity scheduling, building emotional awareness Suicidal/Homicidal: some SI without plan or intent, denies HI Therapist Interventions:    Asked about implementation of mindfulness skills.   Talked about how building emotional awareness includes experiencing positive emotions.  Noted that individuals with PTSD have a tendency to numb their emotions and this includes positive ones in addition  to painful ones.  Asked patient to identify activities she gets some enjoyment from and also activities she used to enjoy.  Reviewed a list of suggested pleasurable activities.  Assigned homework to schedule a pleasant event at least once a week. Summary:   Reported that she has been practicing the mindfulness skills some and "it has helped a little." In the past week she said she hasn't been doing much because she hasn't been feeling well. Noted that overall things in the house are going okay and there has been less arguing. Activities she has been able to enjoy lately are primarily associated with spending time with her grandchildren. Identified reading, going to the park, listening to country music, and camping as activities she  used to enjoy. Agreed to schedule pleasant activities at least once a week.   Plan:  Scheduled to return January 27th.  Will introduce concept of distress tolerance. Diagnosis:     Persistent Depressive Disorder                          PTSD Garnette Scheuermann, LCSW 06/03/2015                                 Trochanteric bursitis of left hip - Assessment & Plan Note   KIMBERY KIETZMAN (MR# [id number])      Trochanteric bursitis of left hip - Assessment & Plan Note Info    Author Note Status Last Update User Last Update Date/Time   Silverio Decamp, MD Written Silverio Decamp, MD 04/30/2015 11:16 AM    Trochanteric bursitis of left hip - Assessment & Plan Note       Resolved after injection at the last visit, overall doing well with psychotropics, further follow-up with psychiatry and PCP. She does need some financial aid before she can continue with [facility] [facility] and knee arthroplasty.          Author Note Status Last Update User Last Update Date/Time     Hali Marry, MD Addendum Hali Marry, MD 05/31/2015  5:02 PM     Assessment & Plan:   Acute status migrainous - given 60mg  IM toradol injection today. Can take Zofran once home. Go home and rest. Call if not better in 24 hours. Continue Topamax and magnesium supplement.   Abnormal weight gain - could be from the abilify versus thyroid d/o versus poor dietary habits. She only eats once a day. Work on spreading out once calories and eating throughout the day.. Offered to check her thyroid but she declined today and says she will think about it. Encouraged her to exercise regularly which she is not currently doing. This would help with her mood and with help with the need for weight loss.  Dysthymic D/O- GAD- 7 score of 19 and PHQ- 9 score of 23.  Not well controlled but I really think she would benefit from continued therapy. She is oriented Cymbalta and Wellbutrin.  Multiple  sclerosis-she's starting to have problems with her peripheral vision again. She was previously seen by Dr. Merleen Nicely in [place] and then seen here by a provider at [facility] neurology. She says she can't with her name but thinks she has it written down at home. She's not been seen in a couple of years because she's been without insurance. In fact retractor for her last year but they had difficulty getting records  and so she was never scheduled. We'll try to refer her to go for neurology for further evaluation. She says her original diagnosis was made at [place] about 11 years ago. She had an MRI done at that time. He was not able to get the original records. Evidently they have been destroyed but was able to get a copy of the original MRI.            Musculoskeletal: Strength & Muscle Tone: abnormal see Dr Rolly Pancake note [place]: Favors rt hip Patient leans: N/A  Psychiatric Specialty Exam: HPI Pt returns as requested for 1 month FU.At last visit review of chart revealed multiple providers writing and altering psychiatric medications for her c/o depression.Pt says "I didnt ask" Pt reports PCP wants her to take 3600 MG of Depakote daily. When pt is asked about lack of efficacy with medications she reveals her subjective statement above thinking she will NEVER get well. When asked why her answer is quite revealing "I'm a mother.I SHOULD HAVE BEEN THERE (when my daughter was killed/died in the MVA)"  She reports that she has decided to have her Psychiatric medications prescribed here. She says Dr Madilyn Fireman is going to get her off Valium which concerns her.She admits to taking more Valium than prescribedShe still has no insurance and says she wasnt aware she could sign up for same because "I 'm not working". She continues to see Counselor and making some progress with daugther's death. She takes 2-3 capsules of Neurontin depending on whether she is taking care of Grandchildren. Dr Madilyn Fireman is  following up on her ?MS.   Review of Systems  Constitutional: Positive for malaise/fatigue. Negative for fever, chills, weight loss and diaphoresis.       APPLYING FOR SSDI  Musculoskeletal: Positive for myalgias, back pain , Joint pain improved.  Neurological: Positive for recent migraine treated at PCP office  Endo/Heme/Allergies: Negative for environmental allergies and polydipsia. Does not bruise/bleed easily.  Psychiatric/Behavioral: Positive for depression. Negative for suicidal ideas, hallucinations, memory loss and substance abuse. The patient is nervous/anxious.        PTSD-does not BELIEVE she can get over daughter's death    Blood pressure 126/70, pulse 86, weight 250 lb (113.399 kg), SpO2 97 %.Body mass index is 40.37 kg/(m^2).  General Appearance: Fairly Groomed   Eye Contact:  Good  Speech:  Clear and Coherent and hesitant at times  Volume:  Normal  Mood:  Euthymic with some anxiety   Affect:  Congruent  Thought Process:  Negative;traumatic  Orientation:  Full (Time, Place, and Person)  Thought Content:  Delusions, Obsessions and Rumination  Suicidal Thoughts:  No  Homicidal Thoughts:  No  Memory:  Intact  Judgement:  Impaired  Insight:  Lacking  Psychomotor Activity: Improved  Concentration:  Intact for visit  Recall:  Good  Fund of Knowledge: Poor  Language: Fair  Akathisia:  NA  Handed:  Right  AIMS (if indicated):  NA  Assets:  Desire for Improvement despite belief she cant get well  ADL's:  Intact  Cognition: Impaired,  Severe  Sleep:  With  meds   Is the patient at risk to self?  No. Has the patient been a risk to self in the past 6 months?  No. Has the patient been a risk to self within the distant past?  No. Is the patient a risk to others?  No. Has the patient been a risk to others in the past 6 months?  No.  Has the patient been a risk to others within the distant past?  No.  Current Medications: Current Outpatient Prescriptions  Medication Sig  Dispense Refill  . ARIPiprazole (ABILIFY) 10 MG tablet Take 1 tablet (10 mg total) by mouth 2 (two) times daily. for adjunctive treatment of depression. 60 tablet 2  . buPROPion (WELLBUTRIN XL) 300 MG 24 hr tablet Take 1 tablet (300 mg total) by mouth daily. 90 tablet 3  . cyanocobalamin 1000 MCG tablet Take 100 mcg by mouth daily.    . cyclobenzaprine (FLEXERIL) 10 MG tablet Take 1 tablet (10 mg total) by mouth 3 (three) times daily. 90 tablet 3  . diazepam (VALIUM) 10 MG tablet Take 0.5-1 tablets (5-10 mg total) by mouth every 12 (twelve) hours as needed for anxiety. This is a 30 days supply (Patient taking differently: Take 5-10 mg by mouth every 12 (twelve) hours as needed for anxiety (Stop taking after last rx and start Klonopin rx). This is a 30 days supply) 55 tablet 1  . DULoxetine (CYMBALTA) 60 MG capsule Take 1 capsule (60 mg total) by mouth daily. 30 capsule 3  . gabapentin (NEURONTIN) 400 MG capsule Take 2-3 caps in am 2-3 caps in pm and 2-3 caps HS 270 capsule 2  . hydrochlorothiazide (HYDRODIURIL) 25 MG tablet TAKE ONE TABLET BY MOUTH DAILY AS NEEDED 30 tablet 1  . HYDROcodone-acetaminophen (NORCO) 7.5-325 MG tablet TAKE ONE TABLET BY MOUTH EVERY EIGHT HOURS AS NEEDED FOR PAIN 90 tablet 0  . magnesium oxide (MAG-OX) 400 MG tablet Take 2 tablets (800 mg total) by mouth at bedtime. 90 tablet 3  . SUMAtriptan (IMITREX) 100 MG tablet Take 1 tablet (100 mg total) by mouth every 2 (two) hours as needed for migraine. No more than 2 in 24 hours. 10 tablet 6  . topiramate (TOPAMAX) 100 MG tablet Take one tablet by mouth in the AM and two tablets in the evening for mood stabilization a. 90 tablet 2  . clonazePAM (KLONOPIN) 1 MG tablet Take 1 tablet (1 mg total) by mouth 3 (three) times daily. DSNFU 07/25/2015 90 tablet 1   No current facility-administered medications for this visit.    Medical Decision Making:  Review and summation of old records (2), Established Problem, Worsening (2), Review  of Last Therapy Session (1) and Review of Medication Regimen & Side Effects (2)  Treatment Plan Summary: Pt given  refills on psychiatric meds except Valium She has 1 refill remaining and is informed we wioll begin Klonopin in March when Valium Rx is complete . Requested [place] evaluation at [facility] apparently not done.Marland KitchenShe will continue her COUNSELING at [facility]. MS FU per Dr Madilyn Fireman. Levada Dy at front desk will assist with getting some insurance thru [place] system.FU 2 months                        Darlyne Russian 06/27/2015, 5:29 AM  Theissue of her MS and Neuro FU continues to get lost in shuffle -will FU on this at next visit as well

## 2015-06-27 ENCOUNTER — Other Ambulatory Visit: Payer: Self-pay | Admitting: Family Medicine

## 2015-06-28 ENCOUNTER — Ambulatory Visit (INDEPENDENT_AMBULATORY_CARE_PROVIDER_SITE_OTHER): Payer: Medicare Other | Admitting: Licensed Clinical Social Worker

## 2015-06-28 DIAGNOSIS — F341 Dysthymic disorder: Secondary | ICD-10-CM | POA: Diagnosis not present

## 2015-06-28 DIAGNOSIS — F4381 Prolonged grief disorder: Secondary | ICD-10-CM

## 2015-06-28 DIAGNOSIS — F4321 Adjustment disorder with depressed mood: Secondary | ICD-10-CM

## 2015-06-28 DIAGNOSIS — F4310 Post-traumatic stress disorder, unspecified: Secondary | ICD-10-CM

## 2015-06-28 DIAGNOSIS — Z634 Disappearance and death of family member: Secondary | ICD-10-CM

## 2015-06-28 DIAGNOSIS — F4329 Adjustment disorder with other symptoms: Secondary | ICD-10-CM

## 2015-06-28 MED ORDER — HYDROCODONE-ACETAMINOPHEN 7.5-325 MG PO TABS: ORAL_TABLET | ORAL | Status: DC

## 2015-06-28 NOTE — Progress Notes (Signed)
THERAPIST PROGRESS NOTE  Session Time: 1:05pm-2:00pm  Participation Level: Active  Behavioral Response: Casual Alert Depressed and tearful  Type of Therapy: Individual Therapy  Treatment Goals addressed: increase ability to tolerate PTSD symptoms  Interventions: Review of mindfulness skills and supportive counseling   Suicidal/Homicidal: some SI without plan or intent, denies HI  Therapist Interventions: Discussed how patient has experienced an increase in depression and anxiety since she read her daughter's medical report.  Normalized this setback.  Reassured her to she can regain the progress she had been making in treatment.  Encouraged her to commit to practicing mindfulness for at least 10 minutes each day.      Summary: Reported that she has not been engaging in activities like she had been.  She said, "The house is a mess.  I have just been sitting and thinking."  Has not been cooking and cleaning as usual.   Admitted that she has not been practicing the coping skills they have reviewed.  Indicated that it has been hard for her not to focus on anything but the medical report.   Acknowledged that she had been making some progress in treatment.  She said, "I could smile more and I was doing things.  It wasn't so hard to get up in the morning.  I had energy."  Indicated she believes she can get back to that point, but it is going to take some time.   Agreed to practice mindfulness for 10 minutes each day.  Noted the morning is probably the best time.   Plan:  Scheduled to return in one week.    Diagnosis: Complicated Grief  Persistent Depressive Disorder  PTSD   [name] 06/28/2015

## 2015-07-02 ENCOUNTER — Telehealth: Payer: Self-pay | Admitting: *Deleted

## 2015-07-02 NOTE — Telephone Encounter (Signed)
Form faxed received confirmation.Ashley Gomez [place]

## 2015-07-02 NOTE — Telephone Encounter (Signed)
Called pt and informed her that her form has been completed and faxed, original placed up front for her to p/u.Ashley KitchenAudelia Hives [place]

## 2015-07-05 ENCOUNTER — Ambulatory Visit (INDEPENDENT_AMBULATORY_CARE_PROVIDER_SITE_OTHER): Payer: Self-pay | Admitting: Sports Medicine

## 2015-07-05 ENCOUNTER — Ambulatory Visit (INDEPENDENT_AMBULATORY_CARE_PROVIDER_SITE_OTHER): Payer: Medicare Other | Admitting: Licensed Clinical Social Worker

## 2015-07-05 VITALS — BP 122/82 | HR 87 | Resp 18 | Wt 253.0 lb

## 2015-07-05 DIAGNOSIS — F4321 Adjustment disorder with depressed mood: Secondary | ICD-10-CM | POA: Diagnosis not present

## 2015-07-05 DIAGNOSIS — F341 Dysthymic disorder: Secondary | ICD-10-CM | POA: Diagnosis not present

## 2015-07-05 DIAGNOSIS — F4310 Post-traumatic stress disorder, unspecified: Secondary | ICD-10-CM

## 2015-07-05 DIAGNOSIS — F4329 Adjustment disorder with other symptoms: Secondary | ICD-10-CM

## 2015-07-05 DIAGNOSIS — F4381 Prolonged grief disorder: Secondary | ICD-10-CM

## 2015-07-05 DIAGNOSIS — M170 Bilateral primary osteoarthritis of knee: Secondary | ICD-10-CM

## 2015-07-05 DIAGNOSIS — Z634 Disappearance and death of family member: Secondary | ICD-10-CM

## 2015-07-05 NOTE — Progress Notes (Signed)
  Subjective:    CC: Bilateral knee pain  HPI: Bilateral knee osteoarthritis: It's been 6 months almost since her last injection, desires repeat bilateral injection, pain is moderate, persistent, localized underneath the kneecap and joint lines, which is weightbearing, minimal mechanical symptoms.  Past medical history, Surgical history, Family history not pertinant except as noted below, Social history, Allergies, and medications have been entered into the medical record, reviewed, and no changes needed.   Review of Systems: No fevers, chills, night sweats, weight loss, chest pain, or shortness of breath.   Objective:    General: Well Developed, well nourished, and in no acute distress.  Neuro: Alert and oriented x3, extra-ocular muscles intact, sensation grossly intact.  HEENT: Normocephalic, atraumatic, pupils equal round reactive to light, neck supple, no masses, no lymphadenopathy, thyroid nonpalpable.  Skin: Warm and dry, no rashes. Cardiac: Regular rate and rhythm, no murmurs rubs or gallops, no lower extremity edema.  Respiratory: Clear to auscultation bilaterally. Not using accessory muscles, speaking in full sentences.  Procedure: Real-time Ultrasound Guided Injection of right knee Device: GE Logiq E  Verbal informed consent obtained.  Time-out conducted.  Noted no overlying erythema, induration, or other signs of local infection.  Skin prepped in a sterile fashion.  Local anesthesia: Topical Ethyl chloride.  With sterile technique and under real time ultrasound guidance:  1 mL Depo-Medrol 40, 2 mL lidocaine, 2 mL Marcaine injected easily. Completed without difficulty  Pain immediately resolved suggesting accurate placement of the medication.  Advised to call if fevers/chills, erythema, induration, drainage, or persistent bleeding.  Images permanently stored and available for review in the ultrasound unit.  Impression: Technically successful ultrasound guided  injection.  Procedure: Real-time Ultrasound Guided Injection of left knee Device: GE Logiq E  Verbal informed consent obtained.  Time-out conducted.  Noted no overlying erythema, induration, or other signs of local infection.  Skin prepped in a sterile fashion.  Local anesthesia: Topical Ethyl chloride.  With sterile technique and under real time ultrasound guidance:  1 mL Depo-Medrol 40, 2 mL lidocaine, 2 mL Marcaine injected easily. Completed without difficulty  Pain immediately resolved suggesting accurate placement of the medication.  Advised to call if fevers/chills, erythema, induration, drainage, or persistent bleeding.  Images permanently stored and available for review in the ultrasound unit.  Impression: Technically successful ultrasound guided injection.  Impression and Recommendations:

## 2015-07-05 NOTE — Progress Notes (Signed)
THERAPIST PROGRESS NOTE  Session Time: 1:05pm-2:00pm  Participation Level: Active  Behavioral Response: Casual Alert Depressed and tearful  Type of Therapy: Individual Therapy  Treatment Goals addressed: increase ability to tolerate PTSD symptoms  Interventions: Distress tolerance skills   Suicidal/Homicidal: some SI without plan or intent, denies HI  Therapist Interventions: Introduced the concept of distress tolerance.  Provided several reasons why it is helpful to learn skills for tolerating distressing emotions.  Noted how avoidance tends to prolong distress.  Discussed how avoiding distress gets in the way of achieving important goals.    Assigned homework to continue to practice mindfulness at least 10 minutes each day and to reach out to a friend she hasn't spoken to in several months.  Summary: Reported feeling "a bit better."  Said she has been practicing mindfulness in different ways.  Indicated it has felt good to take this time for herself. Was able to identify many goals she has for learning to tolerate distress associated with trauma.  She talked about wanting to "not feel so guilty," be a better mother and grandmother, be able to engage in activities and actually enjoy them, and to be able to go out into the community without feeling excessively anxious.  Also mentioned she would like to be able to look at materials from her daughter's funeral.  These things have been kept in a sealed container.       Agreed to complete the homework.     Plan:  Scheduled to return in one week.  Will continue with distress tolerance.    Diagnosis: Complicated Grief  Persistent Depressive Disorder  PTSD   Ashley Gomez 07/05/2015

## 2015-07-05 NOTE — Assessment & Plan Note (Signed)
Now post arthroscopy, partial meniscectomy, chondroplasty in the left knee, persistent pain despite steroid injections and viscous supplementation. As I have done visit have told her that the next step is surgical and she needs to follow up with Dr. Erlinda Hong. I am injecting both knees today for at least some symptomatic relief.

## 2015-07-12 ENCOUNTER — Ambulatory Visit (HOSPITAL_COMMUNITY): Payer: Self-pay | Admitting: Licensed Clinical Social Worker

## 2015-07-15 ENCOUNTER — Other Ambulatory Visit (HOSPITAL_COMMUNITY): Payer: Self-pay | Admitting: Medical

## 2015-07-15 ENCOUNTER — Other Ambulatory Visit (HOSPITAL_COMMUNITY): Payer: Self-pay | Admitting: *Deleted

## 2015-07-15 DIAGNOSIS — F331 Major depressive disorder, recurrent, moderate: Secondary | ICD-10-CM

## 2015-07-15 MED ORDER — ARIPIPRAZOLE 10 MG PO TABS: 10.0000 mg | ORAL_TABLET | Freq: Two times a day (BID) | ORAL | Status: DC

## 2015-07-15 NOTE — Telephone Encounter (Signed)
Contact patient assistance for medication for Abilify 10mg , #60. This is the pt's last refill. Pt will need a new prescription faxed to [phone]. Pt is schedule for appt on 09/16/15.

## 2015-07-19 ENCOUNTER — Ambulatory Visit (INDEPENDENT_AMBULATORY_CARE_PROVIDER_SITE_OTHER): Payer: Medicare Other | Admitting: Licensed Clinical Social Worker

## 2015-07-19 DIAGNOSIS — F4310 Post-traumatic stress disorder, unspecified: Secondary | ICD-10-CM

## 2015-07-19 DIAGNOSIS — Z634 Disappearance and death of family member: Secondary | ICD-10-CM

## 2015-07-19 DIAGNOSIS — F4321 Adjustment disorder with depressed mood: Secondary | ICD-10-CM

## 2015-07-19 DIAGNOSIS — F4329 Adjustment disorder with other symptoms: Secondary | ICD-10-CM

## 2015-07-19 DIAGNOSIS — F341 Dysthymic disorder: Secondary | ICD-10-CM

## 2015-07-19 NOTE — Telephone Encounter (Signed)
Abilify 10mg , #60 was received in office today. Called and informed pt prescription was ready for pick up. Pt verbalizes understanding.
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
## 2015-07-26 ENCOUNTER — Ambulatory Visit (INDEPENDENT_AMBULATORY_CARE_PROVIDER_SITE_OTHER): Payer: Medicare Other | Admitting: Licensed Clinical Social Worker

## 2015-07-26 DIAGNOSIS — Z634 Disappearance and death of family member: Secondary | ICD-10-CM

## 2015-07-26 DIAGNOSIS — F4329 Adjustment disorder with other symptoms: Secondary | ICD-10-CM

## 2015-07-26 DIAGNOSIS — F341 Dysthymic disorder: Secondary | ICD-10-CM | POA: Diagnosis not present

## 2015-07-26 DIAGNOSIS — F4321 Adjustment disorder with depressed mood: Secondary | ICD-10-CM | POA: Diagnosis not present

## 2015-07-26 DIAGNOSIS — F4310 Post-traumatic stress disorder, unspecified: Secondary | ICD-10-CM
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
## 2015-07-28 ENCOUNTER — Ambulatory Visit (INDEPENDENT_AMBULATORY_CARE_PROVIDER_SITE_OTHER): Payer: Self-pay | Admitting: Family Medicine

## 2015-07-28 ENCOUNTER — Encounter: Payer: Self-pay | Admitting: Family Medicine

## 2015-07-28 ENCOUNTER — Other Ambulatory Visit: Payer: Self-pay | Admitting: Family Medicine

## 2015-07-28 ENCOUNTER — Telehealth: Payer: Self-pay

## 2015-07-28 ENCOUNTER — Encounter: Payer: Self-pay | Admitting: Sports Medicine

## 2015-07-28 ENCOUNTER — Ambulatory Visit (INDEPENDENT_AMBULATORY_CARE_PROVIDER_SITE_OTHER): Payer: Self-pay | Admitting: Sports Medicine

## 2015-07-28 VITALS — BP 118/82 | HR 87 | Resp 18 | Wt 260.6 lb

## 2015-07-28 VITALS — BP 118/82 | HR 87 | Wt 260.0 lb

## 2015-07-28 DIAGNOSIS — Z79899 Other long term (current) drug therapy: Secondary | ICD-10-CM

## 2015-07-28 DIAGNOSIS — R635 Abnormal weight gain: Secondary | ICD-10-CM

## 2015-07-28 DIAGNOSIS — M7062 Trochanteric bursitis, left hip: Secondary | ICD-10-CM

## 2015-07-28 DIAGNOSIS — M170 Bilateral primary osteoarthritis of knee: Secondary | ICD-10-CM

## 2015-07-28 DIAGNOSIS — G894 Chronic pain syndrome: Secondary | ICD-10-CM

## 2015-07-28 DIAGNOSIS — F341 Dysthymic disorder: Secondary | ICD-10-CM

## 2015-07-28 MED ORDER — PHENTERMINE HCL 15 MG PO TBDP: 7.5000 mg | ORAL_TABLET | Freq: Every day | ORAL | Status: DC

## 2015-07-28 MED ORDER — HYDROCODONE-ACETAMINOPHEN 7.5-325 MG PO TABS: ORAL_TABLET | ORAL | Status: DC

## 2015-07-28 NOTE — Telephone Encounter (Signed)
We may need to call another pharmacy to see if cary the 15mg  tabs.

## 2015-07-28 NOTE — Telephone Encounter (Signed)
No lets stick with the 15mg .  Quartering it is going to give her very inconsistant dosing.

## 2015-07-28 NOTE — Telephone Encounter (Signed)
After talking with Dr Madilyn Fireman, I called the pharmacy and oked the phentermine to be filled 15 mg capsule 1 q day.  Notified patient that pharmacy called and to notify office if signs of jitteriness, too much energy.

## 2015-07-28 NOTE — Addendum Note (Signed)
Addended by: Teddy Spike on: 07/28/2015 03:09 PM   Modules accepted: Orders

## 2015-07-28 NOTE — Telephone Encounter (Signed)
The local pharmacies only have the medication in 15 mg capsules.  Would it be an option to take one every other day?

## 2015-07-28 NOTE — Progress Notes (Signed)
   Subjective:    Patient ID: Ashley Gomez, female    DOB: 06/21/1970, 45 y.o.   MRN: [id number]  HPI Follow-up dysthymic disorder-gad 7 score of 19 today and PHQ 9 score of 25. She is seeing [facility] for [facility]. She's currently on Cymbalta and Wellbutrin.They are also changing her to Klonopin off of the Valium. She feels like her anxiety has really gone up recently after she read her daughters death/autopsy report.  Chronic pain syndrome-she's currently on hydrocodone every 8 hours as needed. Getting 90 per month. 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 Her to appear made and her gabapentin more recently increased. She is due for urine drug screen today.  She also wants to discuss her weight. She is extremely unhappy with her recent weight gain and it has not helped her knee pain at all. She's gained about 40 pounds in the last 4 or 5 months. She wonders if it could be any of her medications that are causing this. No prior history of thyroid problems and no family history of thyroid problems.he says she has been eating around 1800 cal per day. When I last saw her she was only eating once per day that she says she is doing better with that and eating multiple times throughout the day now.  Review of Systems     Objective:   Physical Exam  Constitutional: She is oriented to person, place, and time. She appears well-developed and well-nourished.  HENT:  Head: Normocephalic and atraumatic.  Cardiovascular: Normal rate, regular rhythm and normal heart sounds.   Pulmonary/Chest: Effort normal and breath sounds normal.  Neurological: She is alert and oriented to person, place, and time.  Skin: Skin is warm and dry.  Psychiatric: She has a normal mood and affect. Her behavior is normal.          Assessment & Plan:  Dysthymic disorder- Following with behavioral health. They did recently adjust her medication some going to go ahead and remove Valium from her medication list which I had  previously prescribed.  Chronic pain management - Due for urine drug screen today. Did refill her medications. Follow-up in 2 months.  Abnormal weight gain/morbid obesity/BMI 41-I did discuss with her that the Abilify can cause weight gain. Her dose was recently increased. Did encourage her to discuss this with her psychiatrist next time she follows up with them. In the meantime we will try a low dose of phentermine. Will start with 15 mg tabs and have her split dose in half. I hesitate to put her on a high dose, as I do not want it to interact with some of her other psychiatric medications. But I do think he could help curb her appetite.sig discussed the smart phone application called my fitness pal to help her set goals and track calories. Encouraged her to download this and start using it.lcer encouraged her to get active. I know she somewhat limited because of her knee pain but I would encourage her to walk for at least 10 minutes a day. We'll also check her thyroid.

## 2015-07-28 NOTE — Patient Instructions (Addendum)
My Fitness Pal - smart phone app to help you lose weight  Work on walking for at least 10 minutes a day.   Avoid caffeine while on the medication.

## 2015-07-28 NOTE — Assessment & Plan Note (Signed)
Right knee is doing well after Depo-Medrol, left knee still has pain. She got no relief, not even temporary from the left knee injection. She is post partial meniscectomy, chondroplasty, steroid and viscous supplementation. She understands that the next episode.

## 2015-07-28 NOTE — Assessment & Plan Note (Signed)
Good response to trochanter bursa injection 4 months ago, repeat ultrasound guided injection today.

## 2015-07-28 NOTE — Telephone Encounter (Signed)
Ok to take the 15  Cap daily.  Ok to give verbal or send new rx.

## 2015-07-28 NOTE — Progress Notes (Signed)
  Subjective:    CC: Follow-up  HPI: Bilateral knee osteoarthritis: This 45 year old female has had an extensive nonsurgical workup, she has seen orthopedic surgery and arthroscopy with meniscectomy and chondroplasty has not been effective. She understands that the next step is knee arthroplasty.  Left hip pain: History of trochanteric bursitis, pain is on the lateral hip and not referable to the joint. Previous injection was over 4 months ago.  Past medical history, Surgical history, Family history not pertinant except as noted below, Social history, Allergies, and medications have been entered into the medical record, reviewed, and no changes needed.   Review of Systems: No fevers, chills, night sweats, weight loss, chest pain, or shortness of breath.   Objective:    General: Well Developed, well nourished, and in no acute distress.  Neuro: Alert and oriented x3, extra-ocular muscles intact, sensation grossly intact.  HEENT: Normocephalic, atraumatic, pupils equal round reactive to light, neck supple, no masses, no lymphadenopathy, thyroid nonpalpable.  Skin: Warm and dry, no rashes. Cardiac: Regular rate and rhythm, no murmurs rubs or gallops, no lower extremity edema.  Respiratory: Clear to auscultation bilaterally. Not using accessory muscles, speaking in full sentences. Left hip: No pain with internal rotation, reproduction of pain with palpation over the greater trochanter.  Procedure: Real-time Ultrasound Guided Injection of left greater trochanteric bursa Device: GE Logiq E  Verbal informed consent obtained.  Time-out conducted.  Noted no overlying erythema, induration, or other signs of local infection.  Skin prepped in a sterile fashion.  Local anesthesia: Topical Ethyl chloride.  With sterile technique and under real time ultrasound guidance:  Spinal needle advanced through the hip abductor's, and into the trochanteric bursa, 1 mL Depo-Medrol 40, 2 mL lidocaine, 2 mL  Marcaine injected easily. Completed without difficulty  Pain immediately resolved suggesting accurate placement of the medication.  Advised to call if fevers/chills, erythema, induration, drainage, or persistent bleeding.  Images permanently stored and available for review in the ultrasound unit.  Impression: Technically successful ultrasound guided injection.  Impression and Recommendations:

## 2015-07-29 ENCOUNTER — Ambulatory Visit: Payer: Self-pay | Admitting: Family Medicine

## 2015-07-29 LAB — DRUG SCR UR, PAIN MGMT, REFLEX CONF
Amphetamine Screen, Ur: NEGATIVE
Barbiturate Quant, Ur: NEGATIVE
Cocaine Metabolites: NEGATIVE
Creatinine,U: 50.03 mg/dL
Marijuana Metabolite: NEGATIVE
Methadone: NEGATIVE
Opiates: NEGATIVE
Phencyclidine (PCP): NEGATIVE
Propoxyphene: NEGATIVE

## 2015-07-29 MED ORDER — PHENTERMINE HCL 15 MG PO TBDP: 15.0000 mg | ORAL_TABLET | Freq: Every day | ORAL | Status: DC

## 2015-07-29 NOTE — Addendum Note (Signed)
Addended by: Rolland Bimler E on: 07/29/2015 03:06 PM   Modules accepted: Orders

## 2015-07-29 NOTE — Telephone Encounter (Signed)
Do you want me to print a script for 30 pillsvs 15 since she will be taking a whole pill each day?

## 2015-08-01 LAB — BENZODIAZEPINES (GC/LC/MS), URINE
Alprazolam metabolite (GC/LC/MS), ur confirm: NEGATIVE ng/mL (ref <25)
Clonazepam metabolite (GC/LC/MS), ur confirm: 52 ng/mL — ABNORMAL HIGH (ref <25)
Flurazepam metabolite (GC/LC/MS), ur confirm: NEGATIVE ng/mL (ref <50)
Lorazepam (GC/LC/MS), ur confirm: NEGATIVE ng/mL (ref <50)
Midazolam (GC/LC/MS), ur confirm: NEGATIVE ng/mL (ref <50)
Nordiazepam (GC/LC/MS), ur confirm: 163 ng/mL — ABNORMAL HIGH (ref <50)
Oxazepam (GC/LC/MS), ur confirm: 478 ng/mL — ABNORMAL HIGH (ref <50)
Temazepam (GC/LC/MS), ur confirm: 485 ng/mL — ABNORMAL HIGH (ref <50)
Triazolam metabolite (GC/LC/MS), ur confirm: NEGATIVE ng/mL (ref <50)

## 2015-08-02 ENCOUNTER — Ambulatory Visit (INDEPENDENT_AMBULATORY_CARE_PROVIDER_SITE_OTHER): Payer: Medicare Other | Admitting: Licensed Clinical Social Worker

## 2015-08-02 DIAGNOSIS — F4321 Adjustment disorder with depressed mood: Secondary | ICD-10-CM | POA: Diagnosis not present

## 2015-08-02 DIAGNOSIS — F341 Dysthymic disorder: Secondary | ICD-10-CM

## 2015-08-02 DIAGNOSIS — F4310 Post-traumatic stress disorder, unspecified: Secondary | ICD-10-CM

## 2015-08-02 DIAGNOSIS — Z634 Disappearance and death of family member: Secondary | ICD-10-CM

## 2015-08-02 DIAGNOSIS — F4329 Adjustment disorder with other symptoms: Secondary | ICD-10-CM

## 2015-08-02 LAB — OPIATES/OPIOIDS (LC/MS-MS)
Codeine Urine: NEGATIVE ng/mL (ref <50)
Hydrocodone: NEGATIVE ng/mL (ref <50)
Hydromorphone: NEGATIVE ng/mL (ref <50)
Morphine Urine: NEGATIVE ng/mL (ref <50)
Norhydrocodone, Ur: 88 ng/mL — ABNORMAL HIGH (ref <50)
Noroxycodone, Ur: NEGATIVE ng/mL (ref <50)
Oxycodone, ur: NEGATIVE ng/mL (ref <50)
Oxymorphone: NEGATIVE ng/mL (ref <50)
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
## 2015-08-06 ENCOUNTER — Ambulatory Visit: Payer: Self-pay | Admitting: Sports Medicine

## 2015-08-09 ENCOUNTER — Encounter (HOSPITAL_COMMUNITY): Payer: Self-pay | Admitting: Licensed Clinical Social Worker

## 2015-08-09 ENCOUNTER — Ambulatory Visit (INDEPENDENT_AMBULATORY_CARE_PROVIDER_SITE_OTHER): Payer: Medicare Other | Admitting: Licensed Clinical Social Worker

## 2015-08-09 DIAGNOSIS — F341 Dysthymic disorder: Secondary | ICD-10-CM | POA: Diagnosis not present

## 2015-08-09 DIAGNOSIS — F4310 Post-traumatic stress disorder, unspecified: Secondary | ICD-10-CM | POA: Diagnosis not present

## 2015-08-09 DIAGNOSIS — F4321 Adjustment disorder with depressed mood: Secondary | ICD-10-CM

## 2015-08-09 DIAGNOSIS — F4329 Adjustment disorder with other symptoms: Secondary | ICD-10-CM

## 2015-08-09 DIAGNOSIS — Z634 Disappearance and death of family member: Secondary | ICD-10-CM

## 2015-08-09 NOTE — Progress Notes (Signed)
THERAPIST PROGRESS NOTE  Session Time: 1:05pm-2:00pm  Participation Level: Active  Behavioral Response: Casual Alert Tearful  Type of Therapy: Individual Therapy  Treatment Goals addressed: increase ability to tolerate PTSD symptoms  Interventions: Understanding relationship patterns   Suicidal/Homicidal: some SI without plan or intent (no change), denies HI  Therapist Interventions:  Encouraged patient to share thoughts and feelings about upcoming disability hearing scheduled for this Wednesday.  Guided patient throught completing another Interpersonal Schemas Worksheet.  Normalized the painful feelings she had during the interaction.  Offered suggestions for alternative perspectives.    Summary:  Reported that her attorney has expressed confidence about her being approved for benefits, but she is not as confident.  Noted her anxiety level has been up in the past week, despite taking Klonopin 3 times a day.  Talked about how it has been difficult to have to ask her daughter for financial support.   The interpersonal situation patient processed was running into an old friend and telling him about losing her daughter, but he was already well aware because he was present at the funeral.  She ended up feeling a combination of shame, shock, and confusion.  Beliefs about self included: "I should remember who was there." and "I'm losing my mind."  Beliefs about others included: "They must think I'm crazy." and "They won't want to talk to me anymore."  Ended up wrapping up the interaction quickly after apologizing for not remembering.              Plan:  Scheduled to return in one week.  Will continue with understanding relationship patterns      Diagnosis: PTSD  Complicated Grief  Persistent Depressive Disorder     [name] 08/09/2015

## 2015-08-12 ENCOUNTER — Other Ambulatory Visit: Payer: Self-pay

## 2015-08-12 DIAGNOSIS — R635 Abnormal weight gain: Secondary | ICD-10-CM

## 2015-08-16 ENCOUNTER — Ambulatory Visit (INDEPENDENT_AMBULATORY_CARE_PROVIDER_SITE_OTHER): Payer: Medicare Other | Admitting: Licensed Clinical Social Worker

## 2015-08-16 DIAGNOSIS — Z634 Disappearance and death of family member: Principal | ICD-10-CM

## 2015-08-16 DIAGNOSIS — F4310 Post-traumatic stress disorder, unspecified: Secondary | ICD-10-CM | POA: Diagnosis not present

## 2015-08-16 DIAGNOSIS — F341 Dysthymic disorder: Secondary | ICD-10-CM

## 2015-08-16 DIAGNOSIS — F4321 Adjustment disorder with depressed mood: Secondary | ICD-10-CM | POA: Diagnosis not present

## 2015-08-16 DIAGNOSIS — F4329 Adjustment disorder with other symptoms: Secondary | ICD-10-CM
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
## 2015-08-19 ENCOUNTER — Telehealth (HOSPITAL_COMMUNITY): Payer: Self-pay | Admitting: *Deleted

## 2015-08-19 NOTE — Telephone Encounter (Signed)
Spoke w/ Eb[facility] from [employer] [facility]. Refill for Abilify 10mg , #60 will be sent to office within 2-3 business days. Pt has a f/u appt on 09/16/15.

## 2015-08-23 ENCOUNTER — Ambulatory Visit (INDEPENDENT_AMBULATORY_CARE_PROVIDER_SITE_OTHER): Payer: Medicare Other | Admitting: Licensed Clinical Social Worker

## 2015-08-23 ENCOUNTER — Telehealth (HOSPITAL_COMMUNITY): Payer: Self-pay | Admitting: *Deleted

## 2015-08-23 DIAGNOSIS — F341 Dysthymic disorder: Secondary | ICD-10-CM | POA: Diagnosis not present

## 2015-08-23 DIAGNOSIS — F4310 Post-traumatic stress disorder, unspecified: Secondary | ICD-10-CM

## 2015-08-23 DIAGNOSIS — F4321 Adjustment disorder with depressed mood: Secondary | ICD-10-CM | POA: Diagnosis not present

## 2015-08-23 DIAGNOSIS — F4329 Adjustment disorder with other symptoms: Secondary | ICD-10-CM

## 2015-08-23 DIAGNOSIS — F4381 Prolonged grief disorder: Secondary | ICD-10-CM

## 2015-08-23 DIAGNOSIS — Z634 Disappearance and death of family member: Secondary | ICD-10-CM

## 2015-08-23 NOTE — Telephone Encounter (Signed)
Received Abilify 10mg , #60 from patient assistance. Called and inform pt prescription is ready for pickup.

## 2015-08-23 NOTE — Progress Notes (Signed)
THERAPIST PROGRESS NOTE  Session Time: 1:05pm-2:00pm  Participation Level: Active  Behavioral Response: Casual Alert Flat Affect  Type of Therapy: Individual Therapy  Treatment Goals addressed: increase ability to tolerate PTSD symptoms  Interventions: Interpersonal Effectiveness training   Suicidal/Homicidal: denied both  Therapist Interventions:      Expanded on understanding and changing relationship patterns.  Provided a case example to illustrate how to reevaluate an interpersonal interaction and consider alternative ways to think about and respond to the situation.   Offered to guide her through a personal example, but patient had trouble coming up with an interaction to process.      Summary:  Did not have any significant events or changes in mood and functioning in the past week.  Said "I didn't do too much."  Found herself staring off into space and losing track of time a lot.   Noted that there have times in the past when she has taken time to consider how she wants to communicate something she considers important with others.  Has found this to be helpful.       Plan: Scheduled to return in one week.  Will focus on assertive communication.   Diagnosis: PTSD  Complicated Grief  Persistent Depressive Disorder     [name] 08/23/2015

## 2015-08-25 ENCOUNTER — Ambulatory Visit (INDEPENDENT_AMBULATORY_CARE_PROVIDER_SITE_OTHER): Payer: Self-pay | Admitting: Family Medicine

## 2015-08-25 VITALS — BP 137/89 | HR 87 | Wt 255.0 lb

## 2015-08-25 DIAGNOSIS — R635 Abnormal weight gain: Secondary | ICD-10-CM

## 2015-08-25 MED ORDER — HYDROCODONE-ACETAMINOPHEN 7.5-325 MG PO TABS: ORAL_TABLET | ORAL | Status: DC

## 2015-08-25 MED ORDER — PHENTERMINE HCL 15 MG PO TBDP: 15.0000 mg | ORAL_TABLET | Freq: Every day | ORAL | Status: DC

## 2015-08-25 NOTE — Progress Notes (Signed)
Agree with below. She has lost 5 lbs.  Great jobs.  F/U in 1 month with PCP  Beatrice Lecher, MD

## 2015-08-25 NOTE — Progress Notes (Signed)
Patient is here for blood pressure and weight check. Denies any trouble sleeping, palpitations, or any other medication problems. Patient has lost weight. A refill for Phentermine will be sent to patient preferred pharmacy. Patient also request a refill on her pain medication since she is in office today. The refill will be 2 days early, ok per PCP to refill but date Rx. This was completed and Rx was given to Patient to take to pharmacy. Patient advised to schedule a four week follow up with her PCP. Verbalized understanding, no further questions.

## 2015-08-30 ENCOUNTER — Ambulatory Visit (INDEPENDENT_AMBULATORY_CARE_PROVIDER_SITE_OTHER): Payer: Medicare Other | Admitting: Licensed Clinical Social Worker

## 2015-08-30 DIAGNOSIS — F4329 Adjustment disorder with other symptoms: Secondary | ICD-10-CM

## 2015-08-30 DIAGNOSIS — F4321 Adjustment disorder with depressed mood: Secondary | ICD-10-CM

## 2015-08-30 DIAGNOSIS — F4310 Post-traumatic stress disorder, unspecified: Secondary | ICD-10-CM

## 2015-08-30 DIAGNOSIS — Z634 Disappearance and death of family member: Principal | ICD-10-CM

## 2015-08-30 DIAGNOSIS — F341 Dysthymic disorder: Secondary | ICD-10-CM

## 2015-08-30 NOTE — Progress Notes (Signed)
THERAPIST PROGRESS NOTE  Session Time: 1:05pm- 2:00pm  Participation Level: Active  Behavioral Response: Casual Alert Flat Affect  Type of Therapy: Individual Therapy  Treatment Goals addressed: increase ability to tolerate PTSD symptoms  Interventions: Assertiveness   Suicidal/Homicidal: denied both  Therapist Interventions:      Identified 3 basic styles of communication: passive, assertive, and aggressive.  Asked patient to describe each and consider pros and cons of each style.  Described how it can be common for someone who tends to be passive to become more resentful and frustrated over time and eventually may explode and communicate in an aggressive manner.   Shared a list of Basic Personal Rights.  Asked patient to comment on whether or not she believes she has each right.        Summary:   Reported "For the most part I think that I am passive."  Reflected on how she used to be more assertive, but since her anxiety has become severe and she has had to rely on her daughter for a place to live she has become more passive.   Indicated that she believes she has most of the basic personal rights on the list.  Only gave a couple of examples of times when she feels like she can't stand up for her rights.  Was able to provide several examples of communicating her rights in an assertive manner.       Plan: Scheduled to return in one week.  Will continue with assertive communication.   Diagnosis: PTSD  Complicated Grief  Persistent Depressive Disorder     [name] 08/30/2015

## 2015-09-06 ENCOUNTER — Ambulatory Visit (HOSPITAL_COMMUNITY): Payer: Self-pay | Admitting: Licensed Clinical Social Worker

## 2015-09-13 ENCOUNTER — Ambulatory Visit (HOSPITAL_COMMUNITY): Payer: Self-pay | Admitting: Licensed Clinical Social Worker

## 2015-09-16 ENCOUNTER — Encounter (HOSPITAL_COMMUNITY): Payer: Self-pay | Admitting: Medical

## 2015-09-16 ENCOUNTER — Ambulatory Visit (INDEPENDENT_AMBULATORY_CARE_PROVIDER_SITE_OTHER): Payer: Medicare Other | Admitting: Medical

## 2015-09-16 ENCOUNTER — Encounter: Payer: Self-pay | Admitting: Family Medicine

## 2015-09-16 ENCOUNTER — Ambulatory Visit (INDEPENDENT_AMBULATORY_CARE_PROVIDER_SITE_OTHER): Payer: Self-pay | Admitting: Family Medicine

## 2015-09-16 VITALS — BP 129/83 | HR 81 | Wt 253.0 lb

## 2015-09-16 VITALS — BP 128/80 | HR 76 | Ht 66.0 in | Wt 253.0 lb

## 2015-09-16 DIAGNOSIS — F341 Dysthymic disorder: Secondary | ICD-10-CM

## 2015-09-16 DIAGNOSIS — F331 Major depressive disorder, recurrent, moderate: Secondary | ICD-10-CM

## 2015-09-16 DIAGNOSIS — F4310 Post-traumatic stress disorder, unspecified: Secondary | ICD-10-CM | POA: Diagnosis not present

## 2015-09-16 DIAGNOSIS — Z634 Disappearance and death of family member: Secondary | ICD-10-CM

## 2015-09-16 DIAGNOSIS — R635 Abnormal weight gain: Secondary | ICD-10-CM

## 2015-09-16 DIAGNOSIS — F4329 Adjustment disorder with other symptoms: Secondary | ICD-10-CM

## 2015-09-16 DIAGNOSIS — G8929 Other chronic pain: Secondary | ICD-10-CM

## 2015-09-16 DIAGNOSIS — J301 Allergic rhinitis due to pollen: Secondary | ICD-10-CM

## 2015-09-16 DIAGNOSIS — Z7189 Other specified counseling: Secondary | ICD-10-CM

## 2015-09-16 DIAGNOSIS — F4321 Adjustment disorder with depressed mood: Secondary | ICD-10-CM | POA: Diagnosis not present

## 2015-09-16 DIAGNOSIS — R0981 Nasal congestion: Secondary | ICD-10-CM

## 2015-09-16 MED ORDER — BUPROPION HCL 100 MG PO TABS: ORAL_TABLET | ORAL | Status: DC

## 2015-09-16 MED ORDER — DULOXETINE HCL 60 MG PO CPEP: 60.0000 mg | ORAL_CAPSULE | Freq: Every day | ORAL | Status: DC

## 2015-09-16 MED ORDER — HYDROCHLOROTHIAZIDE 25 MG PO TABS: ORAL_TABLET | ORAL | Status: DC

## 2015-09-16 MED ORDER — HYDROCODONE-ACETAMINOPHEN 7.5-325 MG PO TABS: ORAL_TABLET | ORAL | Status: DC

## 2015-09-16 MED ORDER — TOPIRAMATE 100 MG PO TABS: ORAL_TABLET | ORAL | Status: DC

## 2015-09-16 MED ORDER — SUMATRIPTAN SUCCINATE 100 MG PO TABS: 100.0000 mg | ORAL_TABLET | ORAL | Status: DC | PRN

## 2015-09-16 MED ORDER — CLONAZEPAM 1 MG PO TABS: ORAL_TABLET | ORAL | Status: DC

## 2015-09-16 MED ORDER — PHENTERMINE HCL 15 MG PO TBDP: 15.0000 mg | ORAL_TABLET | Freq: Every day | ORAL | Status: DC

## 2015-09-16 MED ORDER — ESCITALOPRAM OXALATE 10 MG PO TABS: 10.0000 mg | ORAL_TABLET | Freq: Every day | ORAL | Status: DC

## 2015-09-16 MED ORDER — GABAPENTIN 400 MG PO CAPS: ORAL_CAPSULE | ORAL | Status: DC

## 2015-09-16 NOTE — Progress Notes (Addendum)
[facility] MD/PA/NP OP Progress Note  09/16/2015  Ashley Gomez  MRN:  [id number]  Subjective: "I'm doing better" Chief Complaint:  Chief Complaint    Follow-up; Stress; Trauma; Anxiety; Depression     Visit Diagnosis:     ICD-9-CM ICD-10-CM   1. Depression, major, recurrent, moderate (HCC) 296.32 F33.1 DULoxetine (CYMBALTA) 60 MG capsule  2. Dysthymic disorder 300.4 F34.1 topiramate (TOPAMAX) 100 MG tablet  3. PTSD (post-traumatic stress disorder) 309.81 F43.10 topiramate (TOPAMAX) 100 MG tablet  4. Complicated grief Q000111Q Q000111Q topiramate (TOPAMAX) 100 MG tablet    Past Medical History:  Past Medical History  Diagnosis Date  . Depression   . Anxiety   . MS (multiple sclerosis) ([place])   . Obesity   . Smoking   . Migraines   . Facet hypertrophy of lumbar region     MRI 2007  . DVT (deep venous thrombosis) ([place]) [id number]    L basilic vein   . CVA (cerebral infarction)     Old CVA on MRI brain  . Hypertension     no meds now, lost 50lbs  . Sleep apnea     diagnosed 20 ago, lost wt no CPAP now  . PTSD (post-traumatic stress disorder)   . Seizures ([place]) 2011    no seizure since onset  . Neuromuscular disorder ([place])   . Fibromyalgia     Past Surgical History  Procedure Laterality Date  . Tubal ligation  1992  . Tonsillectomy    . Knee arthroscopy Left 12/30/2014    Procedure: LEFT KNEE ARTHROSCOPY WITH   CHONDROPLASTY;  Surgeon: Leandrew Koyanagi, MD;  Location: [facility];  Service: Orthopedics;  Laterality: Left;  . Chondroplasty Left 12/30/2014    Procedure: CHONDROPLASTY;  Surgeon: Leandrew Koyanagi, MD;  Location: [facility];  Service: Orthopedics;  Laterality: Left;   Family History:  Family History  Problem Relation Age of Onset  . Cancer Mother 37    melanoma  . Hypertension Sister   . Heart disease Sister     valve disease  . Depression Sister   . Diabetes Sister   . Sjogren's syndrome Sister    Social History:  Social History    Social History  . Marital Status: Single    Spouse Name: N/A  . Number of Children: N/A  . Years of Education: N/A   Social History Main Topics  . Smoking status: Current Every Day Smoker -- 1.00 packs/day for 25 years    Types: Cigarettes  . Smokeless tobacco: Never Used     Comment: advised to d/c by 3 cigs per week.  . Alcohol Use: No     Comment: occasional  . Drug Use: No  . Sexual Activity: No   Other Topics Concern  . None   Social History Narrative   Additional History     Expand All Collapse All   THERAPIST PROGRESS NOTE  Session Time: 1:05pm- 2:00pm  Participation Level: Active  Behavioral Response: Casual Alert Flat Affect  Type of Therapy: Individual Therapy  Treatment Goals addressed: increase ability to tolerate PTSD symptoms  Interventions: Assertiveness   Suicidal/Homicidal: denied both  Therapist Interventions:      Identified 3 basic styles of communication: passive, assertive, and aggressive.  Asked patient to describe each and consider pros and cons of each style.  Described how it can be common for someone who tends to be passive to become more resentful and frustrated over time and eventually may explode  and communicate in an aggressive manner.    Shared a list of Basic Personal Rights.  Asked patient to comment on whether or not she believes she has each right.         Summary:    Reported "For the most part I think that I am passive."  Reflected on how she used to be more assertive, but since her anxiety has become severe and she has had to rely on her daughter for a place to live she has become more passive.    Indicated that she believes she has most of the basic personal rights on the list.  Only gave a couple of examples of times when she feels like she can't stand up for her rights.  Was able to provide several examples of communicating her rights in an assertive manner.       Plan: Scheduled to return in one week.  Will  continue with assertive communication.    Diagnosis:       PTSD  Complicated Grief  Persistent Depressive Disorder     Garnette Scheuermann, LCSW 08/30/2015                                 Trochanteric bursitis of left hip - Assessment & Plan Note   Ashley Gomez (MR# [id number])      Trochanteric bursitis of left hip - Assessment & Plan Note Info    Author Note Status Last Update User Last Update Date/Time   Silverio Decamp, MD Written Silverio Decamp, MD 04/30/2015 11:16 AM    Trochanteric bursitis of left hip - Assessment & Plan Note       Resolved after injection at the last visit, overall doing well with psychotropics, further follow-up with psychiatry and PCP. She does need some financial aid before she can continue with [facility] and knee arthroplasty.            Musculoskeletal: Strength & Muscle Tone: Normal Gait & Station: No longer favoring Patient leans: N/A  Psychiatric Specialty Exam: HPI Pt returns as requested for FU.She no longer is using multiple providers writing and altering psychiatric medications for her c/o depression.Pt reports PCP wants her to take 3600 MG of Depakote daily. When pt is asked about lack of efficacy with medications she reveals her subjective statement thinking she will " NEVER get well. "When asked 'why ' her answer is quite revealing "I'm a mother.I SHOULD HAVE BEEN THERE (when my daughter was killed/died in the MVA)".She admits she is doing better with counseling now.She does complain about her Klonopin and is seeking Valium for what she describes as episodes of panic perhaps once a week. She admits to taking more Valium than prescribed in the past but claims to be naieve about abuse and addiction. She c/o wgt gain of 20lbs and doesnt know why-she is on Phentermine now She still has no insurance and says she wasnt aware she could sign up for same because "I 'm not working". She continues to see  Counselor and making some progress with daugther's death. She takes 2-3 capsules of Neurontin depending on whether she is taking care of Grandchildren. Dr Madilyn Fireman is following up on her ?MS and is managing her chronic pain.   Review of Systems  Constitutional: Positive malaise/fatigue improved. Negative for fever, chills, weight loss and diaphoresis.       APPLYING FOR SSDI  Musculoskeletal: Positive for  myalgias, back pain chronic , Joint pain improved.  Neurological: Positive for past migraine treated at PCP office  Endo/Heme/Allergies: Negative for environmental allergies and polydipsia. Does not bruise/bleed easily.  Psychiatric/Behavioral: Positive for depression with improvement but c/o anxiety with panicwhich she claims was better on Valium she abused. Negative for suicidal ideas, hallucinations, memory loss and substance abuse. The patient is nervous/anxious.   CPTSD-does not BELIEVE she can get over daughter's death    Blood pressure 128/80, pulse 76, height 5\' 6"  (1.676 m), weight 253 lb (114.76 kg), SpO2 95 %.Body mass index is 40.85 kg/(m^2).  General Appearance: Well Groomed ;Neat  Eye Contact:  Good  Speech:  Clear and Coherent and hesitant at times  Volume:  Normal  Mood:  Euthymic with some flatness  Affect:  Congruent  Thought Process:  Negative;traumatic  Orientation:  Full (Time, Place, and Person)  Thought Content:  Delusions, Obsessions and Rumination  Suicidal Thoughts:  No  Homicidal Thoughts:  No  Memory:  Intact  Judgement:  Impaired  Insight:  Lacking  Psychomotor Activity: Improved  Concentration:  Intact for visit  Recall:  Good  Fund of Knowledge: Poor  Language: Fair  Akathisia:  NA  Handed:  Right  AIMS (if indicated):  NA  Assets:  Desire for Improvement despite belief she cant get well  ADL's:  Intact  Cognition: Impaired,  Severe  Sleep:  With  meds   Is the patient at risk to self?  No. Has the patient been a risk to self in the past 6  months?  No. Has the patient been a risk to self within the distant past?  No. Is the patient a risk to others?  No. Has the patient been a risk to others in the past 6 months?  No. Has the patient been a risk to others within the distant past?  No.  Current Medications: Current Outpatient Prescriptions  Medication Sig Dispense Refill  . ARIPiprazole (ABILIFY) 10 MG tablet Take 1 tablet (10 mg total) by mouth 2 (two) times daily. for adjunctive treatment of depression. 60 tablet 2  . clonazePAM (KLONOPIN) 1 MG tablet Take 1 tablet TID may repeat dose if needed 135 tablet 0  . cyanocobalamin 1000 MCG tablet Take 100 mcg by mouth daily.    . cyclobenzaprine (FLEXERIL) 10 MG tablet Take 1 tablet (10 mg total) by mouth 3 (three) times daily. 90 tablet 3  . DULoxetine (CYMBALTA) 60 MG capsule Take 1 capsule (60 mg total) by mouth daily. 30 capsule 3  . gabapentin (NEURONTIN) 400 MG capsule Take 2-3 caps in am 2-3 caps in pm and 2-3 caps HS 270 capsule 2  . hydrochlorothiazide (HYDRODIURIL) 25 MG tablet take 1 tablet by mouth once daily if needed 30 tablet 1  . HYDROcodone-acetaminophen (NORCO) 7.5-325 MG tablet TAKE ONE TABLET BY MOUTH EVERY EIGHT HOURS AS NEEDED FOR PAIN. OK to fill on 09/27/15. 90 tablet 0  . magnesium oxide (MAG-OX) 400 MG tablet Take 2 tablets (800 mg total) by mouth at bedtime. 90 tablet 3  . Phentermine HCl 15 MG TBDP Take 15 mg by mouth daily with breakfast. 30 tablet 0  . SUMAtriptan (IMITREX) 100 MG tablet Take 1 tablet (100 mg total) by mouth every 2 (two) hours as needed for migraine. No more than 2 in 24 hours. 10 tablet 6  . topiramate (TOPAMAX) 100 MG tablet Take one tablet by mouth in the AM and two tablets in the evening for mood stabilization a.  90 tablet 2  . buPROPion (WELLBUTRIN) 100 MG tablet Take 1 tablet x 5days 1/2 tablet x 5 days 1/2 tablet QOD x 5days &stop 15 tablet 0  . escitalopram (LEXAPRO) 10 MG tablet Take 1 tablet (10 mg total) by mouth daily. 30  tablet 0   No current facility-administered medications for this visit.    Medical Decision Making:  Review and summation of old records (2), Established Problem, Worsening (2), Review of Last Therapy Session (1) and Review of Medication Regimen & Side Effects (2)  Treatment Plan Summary: Pt given  refills on psychiatric meds except Wellbutrin which she is going to taper off and start 10 mg of Lexapro for anxiety.Advised of Abilify and wgt gain -she will try to take 5 mg daily .x  Increase Klonopin with a 1mg  PRN dose # 45 (half her regular dose based on frequency of her c/o panic) Requested [facility] evaluation at [facility] still apparently not done but she does not appear to require at this time.Marland KitchenMarland KitchenShe will continue her COUNSELING at [facility] with new counselor at end of month-says she has "Homework" from [facility] Roch to see  her through. MS FU per Dr Madilyn Fireman. Levada Dy at front desk  Assisted with getting some insurance thru [place] system at last visit but she is still uninsured. Risks/benefits of medication changes discussed FU 2 weeks to assess how she is doing.                       Darlyne Russian 09/16/2015, 11:19 AM  Theissue of her MS and Neuro FU continues to get lost in shuffle -will FU on this at next visit as well

## 2015-09-16 NOTE — Patient Instructions (Signed)
Recommend a trial of Claritin or Allegra daily for the next week. See if this helps relieve your congestion and headache. If not and please call me back and I will call in an antibiotic for possible sinus infection.

## 2015-09-16 NOTE — Progress Notes (Signed)
Subjective:     Patient ID: Ashley Gomez, female   DOB: [birth year], 45 y.o.   MRN: [id number]  HPI  PTSD/complicated grief-she continues to be seen regularly for therapy by Judson Roch downstairs. She actually has a follow-up appointment today with Dr. De Nurse. She plans on discussing her clonazepam with him. She feels like it's not working well. She is also currently on Abilify.  Chronic pain management of her low back and bilateral knees. She is currently on hydrocodone 3 tabs daily. Her last urine drug screen was 2 months ago. She is actually planning on getting scheduled for knee replacement this summer. She is waiting to hear back about the financial end of it. She is also currently on gabapentin to help control her pain. And she also uses Flexeril as needed. She's also on Cymbalta.  Abnormal weight gain  She is doing well on phentermine. She's not had any chest pain or palpitations. She says she doesn't sleep well but this is not new and she doesn't feel like it's exacerbated by taking the phentermine.  She's also had 2 weeks of sinus headache and congestion. She reports pain under both eyes and over the frontal part of the head. She denies any fevers. No sore throat and no GI symptoms with it. She has also had some allergic symptoms including itching in her throat. She started taking some Tylenol Sinus allergy medication about 3 days ago. But she doesn't like how it makes her feel.  Review of Systems     Objective:   Physical Exam  Constitutional: She is oriented to person, place, and time. She appears well-developed and well-nourished.  HENT:  Head: Normocephalic and atraumatic.  Right Ear: External ear normal.  Left Ear: External ear normal.  Nose: Nose normal.  Mouth/Throat: Oropharynx is clear and moist.  TMs and canals are clear.   Eyes: Conjunctivae and EOM are normal. Pupils are equal, round, and reactive to light.  Neck: Neck supple. No thyromegaly present.  Cardiovascular:  Normal rate, regular rhythm and normal heart sounds.   Pulmonary/Chest: Effort normal and breath sounds normal. She has no wheezes.  Lymphadenopathy:    She has no cervical adenopathy.  Neurological: She is alert and oriented to person, place, and time.  Skin: Skin is warm and dry.  Psychiatric: She has a normal mood and affect.       Assessment:     See below.      Plan:      PTSD/complicated grief-Following with behavioral health. She is seeing psychiatry and getting regular counseling once a week. I do feel like this is been very helpful for her.  Chronic pain management - continue gabapentin, continue Cymbalta. Continue hydrocodone 3 tabs daily. Follow-up in 2 months. Hopefully we'll schedule for knee replacement this summer.  Abnormal weight gain/morbid obesity/BMI 40-she did lose 3 pounds on 7.5 mg of phentermine. Will refill again. He reports that she has been trying to walk some and she's been trying to gradually build up tolerance. She really needs knee replacement so says she can only do so much before she starts to experience a lot of pain.  AR- recommend trial of over-the-counter antihistamine. If not better after one week and please call back and consider treatment for acute sinusitis. She really doesn't want to use saline irrigation or nasal spray. She is opposed to putting something in her nose.

## 2015-09-17 ENCOUNTER — Ambulatory Visit: Payer: Self-pay | Admitting: Family Medicine

## 2015-09-20 ENCOUNTER — Ambulatory Visit (HOSPITAL_COMMUNITY): Payer: Self-pay | Admitting: Licensed Clinical Social Worker

## 2015-09-27 ENCOUNTER — Ambulatory Visit (HOSPITAL_COMMUNITY): Payer: Self-pay | Admitting: Licensed Clinical Social Worker

## 2015-10-04 ENCOUNTER — Ambulatory Visit (INDEPENDENT_AMBULATORY_CARE_PROVIDER_SITE_OTHER): Payer: Self-pay | Admitting: Licensed Clinical Social Worker

## 2015-10-04 ENCOUNTER — Ambulatory Visit (HOSPITAL_COMMUNITY): Payer: Self-pay | Admitting: Licensed Clinical Social Worker

## 2015-10-04 DIAGNOSIS — F341 Dysthymic disorder: Secondary | ICD-10-CM

## 2015-10-04 DIAGNOSIS — F4310 Post-traumatic stress disorder, unspecified: Secondary | ICD-10-CM

## 2015-10-04 DIAGNOSIS — F4321 Adjustment disorder with depressed mood: Secondary | ICD-10-CM

## 2015-10-04 NOTE — Progress Notes (Signed)
   THERAPIST PROGRESS NOTE  Session Time: 1:06 PM-1:50 PM  Participation Level: Active  Behavioral Response: CasualAlertDysphoric and tearful  Type of Therapy: Individual Therapy  Treatment Goals addressed: Coping, Increase Ability to Tolerate PTSD symptoms  Interventions: Supportive, Family Systems, Reframing and Other: trauma focused  Summary: Ashley Gomez is a 45 y.o. female who presents with saying a lot of problems at home because she lives with her older daughter. She is seeking therapy because of losing her daughter over two years ago and she was eight months pregnant. She had two kids at the house who look just like her and that is so hard. She had a huge blow out with older daughter, [name], since last session. She wants her to move out. Her daughter thinks she takes up for her other daughter's kids. [name] and [name] didn't get along and they didn't get to say good-bye. Patient is not allowed to talk about losing [name] and not allowed to take up for daughter's kids. [name] calls them her kids. She thinks she treats them differently because of how she felt about her sister [name]. That is how the last couple of weeks have gone and they are not speaking. She feels guilty and so does patient. She thinks that anger of her daughter has built up underneath and she is taking it out on others. She has been working on mindfulness with therapist. She said that she definitely plans on moving. She agrees that in a way it will be peaceful. It is kind of scary so she won't have the kids and daughter to fall back. It will be different without all the activity around her and it helps to be needed. It will help to be not under stress. She talked about her experience right now of being home alone during the day snd she keeps herself isolated in her room when family is there unless her grandchildren need her. She has two therapy dogs. Patient said that she figures out that when she moves out her  daughter will figure out that she needs her mom. Reviewed session and felt that it helped to talk about what it is going on.   Suicidal/Homicidal: No  Therapist Response: Therapist explained that it would be more helpful if the family came together to raise kids and to support each other through this loss.Discussed family system and when family members are not healthy it causes dysfunctioning in the family system. Validated that her daughter probably has unresolved grief that she is taking out on other family members and underlying anger. Reframed patient moving out and pointed out that it would give her a place to retreat away from the stress and still make sure to be a regular part of grandchildren's life. Validated the importance of her role in their lives. Discussed ways to manage flashbacks with patient and the importance of processing trauma to work through ways that are keeping her stuck.    Plan: Return again in 1 week. 2.Patient continue to apply effective coping skills that she is learning in therapy  Diagnosis: Axis I: Post Traumatic Stress Disorder, complicated Grief, Persistent Depressive Disorder    Axis II: n/a    Brice Kossman A, LCSW 10/04/2015

## 2015-10-07 ENCOUNTER — Encounter (HOSPITAL_COMMUNITY): Payer: Self-pay | Admitting: Medical

## 2015-10-07 ENCOUNTER — Ambulatory Visit (INDEPENDENT_AMBULATORY_CARE_PROVIDER_SITE_OTHER): Payer: Medicare Other | Admitting: Medical

## 2015-10-07 VITALS — BP 126/70 | HR 84 | Ht 66.0 in | Wt 257.0 lb

## 2015-10-07 DIAGNOSIS — F607 Dependent personality disorder: Secondary | ICD-10-CM | POA: Diagnosis not present

## 2015-10-07 DIAGNOSIS — F4310 Post-traumatic stress disorder, unspecified: Secondary | ICD-10-CM

## 2015-10-07 DIAGNOSIS — F4329 Adjustment disorder with other symptoms: Secondary | ICD-10-CM

## 2015-10-07 DIAGNOSIS — G894 Chronic pain syndrome: Secondary | ICD-10-CM

## 2015-10-07 DIAGNOSIS — F4321 Adjustment disorder with depressed mood: Secondary | ICD-10-CM

## 2015-10-07 DIAGNOSIS — Z634 Disappearance and death of family member: Secondary | ICD-10-CM

## 2015-10-07 MED ORDER — CLONAZEPAM 1 MG PO TABS: ORAL_TABLET | ORAL | Status: DC

## 2015-10-07 MED ORDER — TOPIRAMATE ER 100 MG PO CAP24: ORAL_CAPSULE | ORAL | Status: DC

## 2015-10-07 NOTE — Progress Notes (Signed)
S-Pt here to FU on dosage adjustment of Klonopin-reports satisfactory response.Also wants to try CR Topomax vs her TID,Saw Counselor yesterday.O-Alert;oriented;appropriate and comprehensible A-Dosage adjustment satisfactory- P-Refill Klonopin rx for return in approx 2.5 mos. Rx for Trokendi XR printed-pt to register at website on home computer

## 2015-10-08 ENCOUNTER — Telehealth (HOSPITAL_COMMUNITY): Payer: Self-pay | Admitting: Medical

## 2015-10-08 NOTE — Telephone Encounter (Signed)
Pt will need a rx written for Trokendi XR. Pt states she received medication enough for 10 days.

## 2015-10-12 ENCOUNTER — Ambulatory Visit (INDEPENDENT_AMBULATORY_CARE_PROVIDER_SITE_OTHER): Payer: Medicare Other | Admitting: Licensed Clinical Social Worker

## 2015-10-12 DIAGNOSIS — F341 Dysthymic disorder: Secondary | ICD-10-CM | POA: Diagnosis not present

## 2015-10-12 DIAGNOSIS — F4321 Adjustment disorder with depressed mood: Secondary | ICD-10-CM | POA: Diagnosis not present

## 2015-10-12 DIAGNOSIS — Z634 Disappearance and death of family member: Secondary | ICD-10-CM

## 2015-10-12 DIAGNOSIS — F4310 Post-traumatic stress disorder, unspecified: Secondary | ICD-10-CM

## 2015-10-12 DIAGNOSIS — F4329 Adjustment disorder with other symptoms: Secondary | ICD-10-CM

## 2015-10-12 NOTE — Progress Notes (Addendum)
   THERAPIST PROGRESS NOTE  Session Time: 11:05 AM-12:00 PM  Participation Level: Active  Behavioral Response: CasualAlertDepressed and tearful  Type of Therapy: Individual Therapy  Treatment Goals addressed: Coping  Interventions: Supportive and Other: trauam processing, strategies to manage PTSD symptoms  Summary: Ashley Gomez is a 45 y.o. female who presents with .reviewing trauma memories. Patient described how the emergency crew did not follow the right procedures and this contributes to her overwhelming feelings of loss. Therapist reviewed any difficulties that patient is having in managing feelings. Patient relates that her feelings feel overwhelming. On a scale of 1-10 with 10 being the worst she is feeling 8 out of 10 every day. Reviewed with patient what has helped. It helps to get her mind off of it. It helped for example to get mind off of things when she went house hunting. She spends time with, fiance, [name]. She did not eat yesterday because she is so depressed. She agrees that getting out of environment where her daughter is not talking will help with stress. Therapist reviewed strategies of how to handle flashbacks and recurrent thoughts of trauma that included learning from flashback, grounding, giving yourself a positive message and asking what the patient can learn from the flashback. Reviewed session and patient said she feels happy to be able to talk about her daughter.   Suicidal/Homicidal: No  Therapist Response: Therapist reviewed trauma memories with patient and discussed some of the goals of this work include that the client be able to think about the trauma or encounter triggers without experiencing the symptoms at the intensity she does now. Additionally the aim is to process the memories is that staying with these memories can be distressing at first, but over time it will decrease the intensity in a process called deconditioning or habituation. Therapist asked  patient what her daughter, [name], would say to her as a way to work on unfinished business related to loss and help her work toward strategies to help her work through loss. Therapist worked with patient on strategies to manage her stressors including communication strategies with her daughter, [name], insight to her daughter's behavior and that moving to a new place would help be helpful in being in a healthier environment away from stress. Therapist encouraged patient to engage in positive activities and activities for herself to help with mood. Therapist provided supportive interventions. Therapist reviewed strategies to manage flashbacks. Therapist worked on Programmer, systems.   Plan: Return again in 1 weeks.2.Patient work with therapist on processing trauma memories.3.Patient work on coping strategies to improve mood  Diagnosis: Axis I: Post Traumatic Stress Disorder, Complicated Grief, Persistent Depressive Disorder    Axis II: n/a    [name], LCSW 10/12/2015

## 2015-10-18 ENCOUNTER — Ambulatory Visit (INDEPENDENT_AMBULATORY_CARE_PROVIDER_SITE_OTHER): Payer: Medicare Other | Admitting: Licensed Clinical Social Worker

## 2015-10-18 DIAGNOSIS — F341 Dysthymic disorder: Secondary | ICD-10-CM

## 2015-10-18 DIAGNOSIS — F4310 Post-traumatic stress disorder, unspecified: Secondary | ICD-10-CM | POA: Diagnosis not present

## 2015-10-18 DIAGNOSIS — F4321 Adjustment disorder with depressed mood: Secondary | ICD-10-CM | POA: Diagnosis not present

## 2015-10-18 DIAGNOSIS — F4329 Adjustment disorder with other symptoms: Secondary | ICD-10-CM

## 2015-10-18 DIAGNOSIS — Z634 Disappearance and death of family member: Secondary | ICD-10-CM

## 2015-10-18 NOTE — Progress Notes (Signed)
THERAPIST PROGRESS NOTE  Session Time: 10:03 AM to 10:58 AM  Participation Level: Active  Behavioral Response: CasualAlertDepressed, Dysphoric and Tearful  Type of Therapy: Individual Therapy  Treatment Goals addressed: Coping, increase in goal ability to tolerate PTSD symptoms  Interventions: Strength-based, Supportive, Family Systems and Other: Processing of trauma symptoms  Summary: Ashley Gomez is a 45 y.o. female who presents with her mood is okay but has been mostly depressed this week. When around a lot of people she gets anxious and panicky. She is getting better at being around people however. She goes every Sunday to clean off the grave of her daughter. Asked what her deceased daughter would say to her now she was here. Patient related that she would she would say "stop, quit worrying and live your life. We are fine". Relates that she always worried about her kids. Even though she can't see them she worries about them. She she notes that she has made progress income further as the first 2 years didn't want to get out of bed and in the last 8 months not at the point where she is not laying around. In the last eight months. The first two years were pretty bad. It is hard because no one will go to [place] and nobody cares. Her daughter doesn't understand what it is to lose a child. She has a lot of un answered questions. She stays busy to take her mind off of things. She hasn't come to terms that she is gone. Patient encouraged to find meaning through things such as taking care of her daughter's kids and being grandma to all her grandchildren. She feels that it will be better when she is not at the house. She can be the grammy. Right now she is getting critisized for favoring [name]'s (deceased daughter) kids. When she is on her own she can develop relationships with her grandchildren and she can find things she enjoys. Shared that her daughter, [name], is mean. Since her dad has come  back into her life he is on a pedestal and patient is "in the dirt". Moving out will help create normalcy and help her daughter to better appreciate her. Reviewed session and patient said has to figure out how to work through her guilt. Therapist reinforced finding purpose through being grandma and that she has to find purpose for her own life     Suicidal/Homicidal: No  Therapist Response: Therapist work with patient on issues of guilt and helping patient to recognize that ultimately she is not the one in control of events outside herself. Therapist worked on processing of trauma and grief and therapist asked patient what her daughter, [name], which say to her if she was here. Therapist work with patient on creating a narrative for herself where she can find purpose and meaning in going forward with her life including being a grandma and figuring out what she wants for her life. Therapist provided supportive interventions, empathy and validation for her feelings and and patient is able to process her feelings in session with support from therapist. Therapist reaffirmed good problem skills in patient finding her own place will be good for her and also insight building in terms of her relationship with [name], her daughter. Help patient to reevaluate interpersonal interactions and underlying causes of the dynamic with her daughter and encouraged her an alternate ways to think about and respond to the situation.   Plan: Return again in 1 weeks.2. Patient continue to work on healthier  ways to interact with daughter including taking positive steps to move out 3. Patient should continue to work on grief related to loss of her daughter with therapist  Diagnosis: Axis I: Post Traumatic Stress Disorder and Complicated grief, Persistent Depressive Disorder    Axis II: No diagnosis    Kariann Wecker A, LCSW 10/18/2015

## 2015-10-19 ENCOUNTER — Telehealth (HOSPITAL_COMMUNITY): Payer: Self-pay | Admitting: *Deleted

## 2015-10-19 NOTE — Telephone Encounter (Signed)
PT states she can afford Trokendi and would like previous prescription for Topiramate sent to pharmacy. Thank you

## 2015-10-19 NOTE — Telephone Encounter (Signed)
Spoke w/ Tiara from Patient Assistance Program. Refill for Abilify 10mg , #60 will be sent to office within 2-3 business days. Pt has a f/u appt on 11/11/15. Per Tiara, pt will need to receritify for the program after 10/24/15.

## 2015-10-20 NOTE — Telephone Encounter (Signed)
Call pharmacy and see how much medication se he received of the XR Thanks

## 2015-10-20 NOTE — Telephone Encounter (Signed)
?   Recertify-she could have signed up for insurance but didnt during open enrollment-does she have a Education officer, museum?

## 2015-10-20 NOTE — Telephone Encounter (Signed)
[employer], spoke w/ Cecilie Lowers. Per Cecilie Lowers, the cost for a 30 day supply of Trokendi would be $200-$300. Pt pickup a rx for Topiramate ER on 10/19/15.  Spoke with pt, pt would prefer to stay on previous rx of Topiramate.

## 2015-10-25 ENCOUNTER — Ambulatory Visit (HOSPITAL_COMMUNITY): Payer: Self-pay | Admitting: Licensed Clinical Social Worker

## 2015-10-25 NOTE — Telephone Encounter (Signed)
Ask Ashley Gomez what she was able to do re insurance thru [facility]? Thanks

## 2015-10-25 NOTE — Telephone Encounter (Addendum)
Called and spoke with pt about insurance information. Pt states she does not have a Education officer, museum. Spoke w/ Tiara from patient assistance program, enrollment forms will need to be completed by provider to continue rx for Abilify.  Forms will need to be faxed to [phone] by 11/13/2015.  Pt is schedule for a f/u appt on 11/11/15.

## 2015-10-26 ENCOUNTER — Ambulatory Visit (INDEPENDENT_AMBULATORY_CARE_PROVIDER_SITE_OTHER): Payer: Medicare Other | Admitting: Licensed Clinical Social Worker

## 2015-10-26 DIAGNOSIS — F341 Dysthymic disorder: Secondary | ICD-10-CM | POA: Diagnosis not present

## 2015-10-26 DIAGNOSIS — F4310 Post-traumatic stress disorder, unspecified: Secondary | ICD-10-CM

## 2015-10-26 DIAGNOSIS — F4321 Adjustment disorder with depressed mood: Secondary | ICD-10-CM

## 2015-10-26 DIAGNOSIS — F4329 Adjustment disorder with other symptoms: Secondary | ICD-10-CM

## 2015-10-26 DIAGNOSIS — Z634 Disappearance and death of family member: Secondary | ICD-10-CM

## 2015-10-26 NOTE — Progress Notes (Signed)
   THERAPIST PROGRESS NOTE  Session Time: 10 AM to 10:50 AM  Participation Level: Active  Behavioral Response: CasualAlertEuthymic  Type of Therapy: Individual Therapy  Treatment Goals addressed: Coping, increase patient's ability to tolerate PTSD symptoms  Interventions: Supportive, Family Systems and Other: Interventions to help patient process grief Summary: Ashley Gomez is a 45 y.o. female who presents with enjoying her sister's visit. It was helpful to validate what was going on at the house and how her daughter, Ashley Gomez, was treating he. Discussed how her daughter continues to be mean to family members. She treats Ashley Gomez badly and patient thinks it is related to her grief and loss because she looks just like Ashley Gomez, the daughter that patient lost. They did not have a close relationship growing up and patient said that she believes that she resented Ashley Gomez. Her sister went with her to the cemetary and it was good. Discussed her relationship with her mom and that she did not want her. Patient realizes that she can't spend time worried about how her mom feels. She owes her an apology. She has done so many things to her, and recognizes that had many issues growing up and that it had to have a major impact. She is grateful to her grandparents because she had her sister, her twin would have been adopted and they played a significant part in her life. She is going to look at a house this weekend and from pictures she loves it.  She feels cardinals are a sign of loved ones who have passed and sees them at the bird feeder in the morning. This is one of the ritual she has to connect with low friends who have passed. She sees other signs as well when she asks them to be near. She talks to her daughter and her grandparents. Discussed positive activities she can do to handle depression and she says that she walks outside as an exercise to help with mood. Reviewed session and patient related she is glad  to talk about her sister and her visit.   Suicidal/Homicidal: No  Therapist Response: Therapist validated that patient is trying to process her grief in a healthy way and she is not getting any support. Discussed rituals that help with the grief process. Data fight that specific patterns are repeating in order to reinforce patient's meaning and purpose. Patient's grandparents were significant in her life and therapist pointed out in the same way she will play a significant part in her grandchildren's life. Identified defense mechanisms in place that are impacting the way her daughter, Ashley Gomez, is behaving and discussed how defense mechanisms work in general. Continued discuss strategies to manage depression including positive activities such as exercising. There is provided strength based and supportive intervention  Plan: Return again in 1 weeks. 2. Patient will continue to engage in rituals to help her with grief process. 3. Patient will continue to use problem solving skills to manage stressors.  Diagnosis: Axis I: Post Traumatic Stress Disorder, complicated grief, persistent depressive disorder    Axis II: No diagnosis    [name],[name], LCSW 10/26/2015

## 2015-10-28 ENCOUNTER — Ambulatory Visit (INDEPENDENT_AMBULATORY_CARE_PROVIDER_SITE_OTHER): Payer: Self-pay | Admitting: Family Medicine

## 2015-10-28 ENCOUNTER — Encounter: Payer: Self-pay | Admitting: Family Medicine

## 2015-10-28 ENCOUNTER — Other Ambulatory Visit (HOSPITAL_COMMUNITY)
Admission: RE | Admit: 2015-10-28 | Discharge: 2015-10-28 | Disposition: A | Discharge status: Home / Self Care | Payer: Medicaid Other | Source: Ambulatory Visit | Attending: Family Medicine | Admitting: Family Medicine

## 2015-10-28 VITALS — BP 133/75 | HR 88 | Wt 266.0 lb

## 2015-10-28 DIAGNOSIS — Z01419 Encounter for gynecological examination (general) (routine) without abnormal findings: Secondary | ICD-10-CM | POA: Diagnosis present

## 2015-10-28 DIAGNOSIS — Z1231 Encounter for screening mammogram for malignant neoplasm of breast: Secondary | ICD-10-CM

## 2015-10-28 DIAGNOSIS — R1032 Left lower quadrant pain: Secondary | ICD-10-CM

## 2015-10-28 DIAGNOSIS — Z1151 Encounter for screening for human papillomavirus (HPV): Secondary | ICD-10-CM | POA: Diagnosis present

## 2015-10-28 DIAGNOSIS — R635 Abnormal weight gain: Secondary | ICD-10-CM

## 2015-10-28 DIAGNOSIS — Z0000 Encounter for general adult medical examination without abnormal findings: Secondary | ICD-10-CM

## 2015-10-28 DIAGNOSIS — I10 Essential (primary) hypertension: Secondary | ICD-10-CM

## 2015-10-28 LAB — CBC
HCT: 42.9 % (ref 35.0–45.0)
Hemoglobin: 14.3 g/dL (ref 11.7–15.5)
MCH: 28.9 pg (ref 27.0–33.0)
MCHC: 33.3 g/dL (ref 32.0–36.0)
MCV: 86.8 fL (ref 80.0–100.0)
MPV: 10.4 fL (ref 7.5–12.5)
Platelets: 249 10*3/uL (ref 140–400)
RBC: 4.94 MIL/uL (ref 3.80–5.10)
RDW: 14 % (ref 11.0–15.0)
WBC: 5.7 10*3/uL (ref 3.8–10.8)

## 2015-10-28 LAB — TSH: TSH: 0.8 mIU/L

## 2015-10-28 MED ORDER — HYDROCODONE-ACETAMINOPHEN 7.5-325 MG PO TABS: ORAL_TABLET | ORAL | Status: DC

## 2015-10-28 MED ORDER — HYDROCHLOROTHIAZIDE 25 MG PO TABS: ORAL_TABLET | ORAL | Status: DC

## 2015-10-28 NOTE — Addendum Note (Signed)
Addended by: Teddy Spike on: 10/28/2015 11:41 AM   Modules accepted: Orders, Medications

## 2015-10-28 NOTE — Progress Notes (Signed)
  Subjective:     Ashley Gomez is a 45 y.o. female and is here for a comprehensive physical exam. The patient reports no problems.  Social History   Social History  . Marital Status: Single    Spouse Name: N/A  . Number of Children: N/A  . Years of Education: N/A   Occupational History  . Not on file.   Social History Main Topics  . Smoking status: Current Every Day Smoker -- 1.00 packs/day for 25 years    Types: Cigarettes  . Smokeless tobacco: Never Used     Comment: advised to d/c by 3 cigs per week.  . Alcohol Use: No     Comment: occasional  . Drug Use: No  . Sexual Activity: No   Other Topics Concern  . Not on file   Social History Narrative   Health Maintenance  Topic Date Due  . PAP SMEAR  03/20/2015  . INFLUENZA VACCINE  12/14/2015  . TETANUS/TDAP  10/17/2020  . HIV Screening  Completed    The following portions of the patient's history were reviewed and updated as appropriate: allergies, current medications, past family history, past medical history, past social history, past surgical history and problem list.  Review of Systems A comprehensive review of systems was negative.   Objective:    BP 133/75 mmHg  Pulse 88  Wt 266 lb (120.657 kg)  SpO2 100% General appearance: alert, cooperative and appears stated age Head: Normocephalic, without obvious abnormality, atraumatic Eyes: conj clear, EOMi, PEERLA Ears: normal TM's and external ear canals both ears Nose: Nares normal. Septum midline. Mucosa normal. No drainage or sinus tenderness. Throat: lips, mucosa, and tongue normal; teeth and gums normal Neck: no adenopathy, no carotid bruit, no JVD, supple, symmetrical, trachea midline and thyroid not enlarged, symmetric, no tenderness/mass/nodules Back: symmetric, no curvature. ROM normal. No CVA tenderness. Lungs: clear to auscultation bilaterally Breasts: normal appearance, no masses or tenderness Heart: regular rate and rhythm, S1, S2 normal, no  murmur, click, rub or gallop Abdomen: soft, non-tender; bowel sounds normal; no masses,  no organomegaly Pelvic: cervix normal in appearance, external genitalia normal, no adnexal masses or tenderness, no cervical motion tenderness, rectovaginal septum normal, uterus normal size, shape, and consistency, vagina normal without discharge and mild tenderness in the LLQ on pelvic exam Extremities: extremities normal, atraumatic, no cyanosis or edema Pulses: 2+ and symmetric Skin: Skin color, texture, turgor normal. No rashes or lesions Lymph nodes: Cervical, supraclavicular, and axillary nodes normal. Neurologic: Alert and oriented X 3, normal strength and tone. Normal symmetric reflexes. Normal coordination and gait    Assessment:    Healthy female exam.     Plan:     See After Visit Summary for Counseling Recommendations   Keep up a regular exercise program and make sure you are eating a healthy diet Try to eat 4 servings of dairy a day, or if you are lactose intolerant take a calcium with vitamin D daily.  Your vaccines are up to date.  Call with Pap smear results once available. Schedule for pelvic ultrasound since she did have some tenderness in the left lower quadrant with pelvic exam. Schedule mammogram. Due for screening labs.

## 2015-10-28 NOTE — Patient Instructions (Signed)
Keep up a regular exercise program and make sure you are eating a healthy diet Try to eat 4 servings of dairy a day, or if you are lactose intolerant take a calcium with vitamin D daily.  Your vaccines are up to date.   

## 2015-10-29 LAB — COMPLETE METABOLIC PANEL WITH GFR
ALT: 9 U/L (ref 6–29)
AST: 15 U/L (ref 10–35)
Albumin: 3.8 g/dL (ref 3.6–5.1)
Alkaline Phosphatase: 79 U/L (ref 33–115)
BUN: 14 mg/dL (ref 7–25)
CO2: 20 mmol/L (ref 20–31)
Calcium: 8.7 mg/dL (ref 8.6–10.2)
Chloride: 110 mmol/L (ref 98–110)
Creat: 0.82 mg/dL (ref 0.50–1.10)
GFR, Est African American: >89 mL/min (ref >=60)
GFR, Est Non African American: 86 mL/min (ref >=60)
Glucose, Bld: 92 mg/dL (ref 65–99)
Potassium: 4.1 mmol/L (ref 3.5–5.3)
Sodium: 139 mmol/L (ref 135–146)
Total Bilirubin: 0.3 mg/dL (ref 0.2–1.2)
Total Protein: 6.5 g/dL (ref 6.1–8.1)

## 2015-10-29 LAB — LIPID PANEL
Cholesterol: 231 mg/dL — ABNORMAL HIGH (ref 125–200)
HDL: 47 mg/dL (ref >=46)
LDL Cholesterol: 138 mg/dL — ABNORMAL HIGH (ref <130)
Total CHOL/HDL Ratio: 4.9 Ratio (ref <=5.0)
Triglycerides: 231 mg/dL — ABNORMAL HIGH (ref <150)
VLDL: 46 mg/dL — ABNORMAL HIGH (ref <30)

## 2015-10-29 LAB — ESTRADIOL: Estradiol: 15 pg/mL

## 2015-10-29 LAB — FOLLICLE STIMULATING HORMONE: FSH: 39.5 m[IU]/mL

## 2015-10-29 LAB — CYTOLOGY - PAP

## 2015-10-29 LAB — PROGESTERONE: Progesterone: <0.5 ng/mL

## 2015-10-29 LAB — LUTEINIZING HORMONE: LH: 39.9 m[IU]/mL

## 2015-11-01 ENCOUNTER — Ambulatory Visit (INDEPENDENT_AMBULATORY_CARE_PROVIDER_SITE_OTHER): Payer: Medicare Other | Admitting: Licensed Clinical Social Worker

## 2015-11-01 DIAGNOSIS — F4321 Adjustment disorder with depressed mood: Secondary | ICD-10-CM

## 2015-11-01 DIAGNOSIS — F4329 Adjustment disorder with other symptoms: Secondary | ICD-10-CM

## 2015-11-01 DIAGNOSIS — F341 Dysthymic disorder: Secondary | ICD-10-CM | POA: Diagnosis not present

## 2015-11-01 DIAGNOSIS — Z634 Disappearance and death of family member: Secondary | ICD-10-CM

## 2015-11-01 DIAGNOSIS — F4310 Post-traumatic stress disorder, unspecified: Secondary | ICD-10-CM | POA: Diagnosis not present

## 2015-11-01 NOTE — Progress Notes (Signed)
   THERAPIST PROGRESS NOTE  Session Time: 2:00 PM to 2:50 PM  Participation Level: Active  Behavioral Response: CasualAlertDepressed, Dysphoric and Tearful  Type of Therapy: Individual Therapy  Treatment Goals addressed: Coping, increase ability to tolerate PTSD symptoms  Interventions: Strength-based, Supportive and Other: Trauma processing  Summary: Ashley Gomez is a 45 y.o. female who presents with crying and relating that she keeps reviewing the medical report for her daughter she gave therapist specifics of what happened. She explained that they never took a pregnancy test, she was given a muscle paralytic that one is not supposed to give to someone who is pregnant, she thinks that she may have been going into labor because of heavy breathing, she went into cardiac arrest, was brought back, went into cardiac arrest again, then they had to cut her heart open and massage her heart. Medical report states her daughter died from blunt forced trauma from an accident. She goes over this report and thinks that if they've done what they exposed to she would still be alive. She relates though that her grieving comes in go in can just hit her by surprise. She has decided not to pursue legal action as she realizes nothing will bring her daughter back. She relates that for the past week she hasn't done anything as the grandchildren are away so there has been nothing to keep her busy. She continues to be at her daughter's house in an environment that is unsupportive as her daughter does not talk to her. She looks forward to being in her own place and the plan is for boyfriend to come live with her and they plan to marry. She will be happy to be in her own household    Suicidal/Homicidal: No  Therapist Response: Therapist reviewed symptoms of trauma and identified patient's current symptoms as symptoms of trauma. Therapist explained the goals of trauma processing that are occurring in session as a way  for her to have some control over her symptoms and that over time she will make meaningful connections between her feelings and her experiences in a safe and supportive environment dating with these memories rather than running away from them. It can be very distressing at first, but over time will help to decrease the anxiety and fears that are associated with them. Assess is called deconditioning her habituation. A second goal is to help the client to assimilate and intergrade her experiences the symptoms reflect the unorganized and unassimilated feelings associated with trauma. Part of the goal of processing trauma for the the patient to be able to organize these distressing feelings and memories and find a place for them.. Therapist pointed out how processing trauma will help yield some understanding of how the past as influence the present but not need continue to do so. Therapist validated rituals patient is performing and also to reinforce the value of her own life and that finding meaning of the memory of her daughter to the value of her own life. Therapist continues to process trauma with patient.  Plan: Return again in 1 weeks.2. Patient continue to learn and implement strategies to improve trauma symptoms  Diagnosis: Axis I: Post Traumatic Stress Disorder and Complicated grief, persistent depressive disorder    Axis II: No diagnosis    Xoie Kreuser A, LCSW 11/01/2015

## 2015-11-03 ENCOUNTER — Ambulatory Visit (INDEPENDENT_AMBULATORY_CARE_PROVIDER_SITE_OTHER): Payer: Self-pay

## 2015-11-03 DIAGNOSIS — R1032 Left lower quadrant pain: Secondary | ICD-10-CM

## 2015-11-03 DIAGNOSIS — R938 Abnormal findings on diagnostic imaging of other specified body structures: Secondary | ICD-10-CM

## 2015-11-03 DIAGNOSIS — Z1231 Encounter for screening mammogram for malignant neoplasm of breast: Secondary | ICD-10-CM

## 2015-11-11 ENCOUNTER — Ambulatory Visit (HOSPITAL_COMMUNITY): Payer: Self-pay | Admitting: Medical

## 2015-11-11 ENCOUNTER — Encounter (HOSPITAL_COMMUNITY): Payer: Self-pay | Admitting: Medical

## 2015-11-11 ENCOUNTER — Ambulatory Visit (INDEPENDENT_AMBULATORY_CARE_PROVIDER_SITE_OTHER): Payer: Self-pay | Admitting: Medical

## 2015-11-11 ENCOUNTER — Telehealth (HOSPITAL_COMMUNITY): Payer: Self-pay | Admitting: *Deleted

## 2015-11-11 DIAGNOSIS — Z9114 Patient's other noncompliance with medication regimen: Secondary | ICD-10-CM

## 2015-11-11 DIAGNOSIS — Z760 Encounter for issue of repeat prescription: Secondary | ICD-10-CM

## 2015-11-11 MED ORDER — ARIPIPRAZOLE 10 MG PO TABS: 10.0000 mg | ORAL_TABLET | Freq: Two times a day (BID) | ORAL | Status: DC

## 2015-11-11 MED ORDER — CLONAZEPAM 1 MG PO TABS: ORAL_TABLET | ORAL | Status: DC

## 2015-11-11 NOTE — Progress Notes (Signed)
Patient ID: Ashley Gomez, female   DOB: 1970-12-14, 45 y.o.   MRN: [id number] Pt brought in early to sign paperwork for Abilify rx assistance-while here c/o being out of her Klonopin-Pharmacy records reviewed-pt switched pharmacies and her 30 day supply was switche by pharmacy to 22/23 days respectively so she took Klonopin QID instaed TID with PRN rescue dose as agreed to.Claims ignorance ??? Paperwork done for Abilify RX-Given RX for Klonopin #90 /1 refill-no furher PRN doses/Pt instructed to attend [place] Keep reg FU schedule

## 2015-11-11 NOTE — Telephone Encounter (Signed)
Pt came in office today to sign paperwork for recertification in the patient assistance program. Paperwork complete by provider for [employer] for Abilify 10mg , #60 faxed to [phone].

## 2015-11-15 ENCOUNTER — Ambulatory Visit (HOSPITAL_COMMUNITY): Payer: Self-pay | Admitting: Licensed Clinical Social Worker

## 2015-11-17 ENCOUNTER — Telehealth: Payer: Self-pay

## 2015-11-17 DIAGNOSIS — R9389 Abnormal findings on diagnostic imaging of other specified body structures: Secondary | ICD-10-CM

## 2015-11-17 NOTE — Telephone Encounter (Signed)
Patient advised of recommendations. Ordered the ultrasound. She states she still has pain in her left side when she bends over. Should she be concerned?

## 2015-11-17 NOTE — Telephone Encounter (Signed)
Just make sure her bowels are moving normally. If they are stretched or she still having hard stools and that could be causing her symptoms. But they did not see anything on the ultrasound that should really be causing her pain.

## 2015-11-17 NOTE — Telephone Encounter (Signed)
See ultrasound result notes. Patient states she is not having regular periods. She states she has been in menopause for the last three years. Please advise next step.

## 2015-11-17 NOTE — Telephone Encounter (Signed)
I apologize, I am not sure who this was routed to while I was out of town. It does look like she needs a repeat ultrasound in 3 months to look again at the thickness of the endometrial lining. Okay to place order and have them schedule it for around September 21.

## 2015-11-17 NOTE — Telephone Encounter (Signed)
Patient advised. She denies problems with BM and denies hard stools.

## 2015-11-19 ENCOUNTER — Ambulatory Visit (INDEPENDENT_AMBULATORY_CARE_PROVIDER_SITE_OTHER): Payer: Medicare Other | Admitting: Licensed Clinical Social Worker

## 2015-11-19 DIAGNOSIS — F4310 Post-traumatic stress disorder, unspecified: Secondary | ICD-10-CM

## 2015-11-19 DIAGNOSIS — F4321 Adjustment disorder with depressed mood: Secondary | ICD-10-CM | POA: Diagnosis not present

## 2015-11-19 DIAGNOSIS — F4329 Adjustment disorder with other symptoms: Secondary | ICD-10-CM

## 2015-11-19 DIAGNOSIS — F4381 Prolonged grief disorder: Secondary | ICD-10-CM

## 2015-11-19 DIAGNOSIS — F341 Dysthymic disorder: Secondary | ICD-10-CM

## 2015-11-19 DIAGNOSIS — Z634 Disappearance and death of family member: Secondary | ICD-10-CM

## 2015-11-19 NOTE — Progress Notes (Signed)
   THERAPIST PROGRESS NOTE  Session Time: Was 9 AM to 9:50 AM  Participation Level: Active  Behavioral Response: CasualAlertDysphoric and Tearful  Type of Therapy: Individual Therapy  Treatment Goals addressed: Coping, increase ability to tolerate PTSD symptoms  Interventions: Supportive, Reframing and Other: Processing of emotions and grief counseling  Summary: Ashley Gomez is a 45 y.o. female who presents with feeling very stressed out related to legal issues that her boyfriend is being charged with but who she reports he had nothing to do with. She said that it's been difficult because everything was on track and going well with them. She's not able to open up to anyone about her stress as her daughter who she is living with does not get along with him. She said her multiple sclerosis has flared up probably related to the stress and is caused her to feel pain. Things remain difficult at her daughter's house but she has been active in looking for her own place and knows that will improve things. She was tearful at times and interview as she feels she has no one to talk to and finds it helpful that she can open up to therapist about things. We discuss her supports and she said she lost them after her daughter died in she explains the reason is that they didn't know what to say to her about the loss and so they faded away. We reviewed positive things she can do for herself right now. She spends time with her grandchildren although they drive her crazy and talks to her sister every day. She recognizes that she has no control over issues with her boyfriend and the she realizes she has to let go until the hearing comes up. Discussed how she still gets triggered randomly that reminds her of her daughter she lost that leads to crying and feeling sad.  Suicidal/Homicidal: No  Therapist Response: Therapist provided supportive interventions and encouraged patient to recognize her lack over control over  her boyfriend situation and to encourage her to focus her energy on things in her control which will help her in implementing more positive coping strategies. Therapist help patient process her emotions around recent stressors including anger and sadness about the situation. Therapist work with her on reframing in terms of seeing this episode that may have sidetracked them but they can work still get back on track. Therapist encouraged patient not to engage in fortune telling but focus on strategies to help herself feel better in the present. Reviewed some of the insights patient has gained about grieving process to help her manage triggers and also continue to help her focus on purpose and meaning in her life right now. . Encouraged her to read option B: Facing of adversity, building resilience and finding joy by Demetrios Isaacs to help her gain insight to the grieving process.   Plan: Return again in 1 week. 2. Patient continue to use therapy session to work through feelings and and continue to learn and implement positive coping strategies  Diagnosis: Axis I: Post Traumatic Stress Disorder and Complicated grief, persistent depressive disorder    Axis II: No diagnosis    [name],[name], LCSW 11/19/2015

## 2015-11-24 ENCOUNTER — Other Ambulatory Visit: Payer: Self-pay

## 2015-11-24 ENCOUNTER — Telehealth (HOSPITAL_COMMUNITY): Payer: Self-pay | Admitting: *Deleted

## 2015-11-24 MED ORDER — HYDROCODONE-ACETAMINOPHEN 7.5-325 MG PO TABS: 1.0000 | ORAL_TABLET | Freq: Three times a day (TID) | ORAL | Status: DC | PRN

## 2015-11-24 NOTE — Telephone Encounter (Signed)
Pt phone into office to request a refill for Abilify 10mg . Called patient assistance program at [phone]. Spoke w/ Laverna Peace, was informed provider will need to submit new forms before refill could be process. New forms will be faxed to [facility] location to be completed by provider. After provider completes new forms, please fax to [phone], processing time for forms is 24-48 hours, medication will be faxed to office for pt to pick up.

## 2015-11-30 ENCOUNTER — Ambulatory Visit (INDEPENDENT_AMBULATORY_CARE_PROVIDER_SITE_OTHER): Payer: Medicare Other | Admitting: Licensed Clinical Social Worker

## 2015-11-30 DIAGNOSIS — F341 Dysthymic disorder: Secondary | ICD-10-CM

## 2015-11-30 DIAGNOSIS — Z634 Disappearance and death of family member: Secondary | ICD-10-CM

## 2015-11-30 DIAGNOSIS — F4310 Post-traumatic stress disorder, unspecified: Secondary | ICD-10-CM | POA: Diagnosis not present

## 2015-11-30 DIAGNOSIS — F4321 Adjustment disorder with depressed mood: Secondary | ICD-10-CM

## 2015-11-30 DIAGNOSIS — F4329 Adjustment disorder with other symptoms: Secondary | ICD-10-CM

## 2015-11-30 NOTE — Progress Notes (Signed)
   THERAPIST PROGRESS NOTE  Session Time: 2 PM to 2:55 PM at  Participation Level: Active  Behavioral Response: CasualAlertDepressed and Tearful at times in session  Type of Therapy: Individual Therapy  Treatment Goals addressed: Coping, increase ability to tolerate PTSD symptoms  Interventions: Strength-based, Supportive, Reframing and Other: CBT, cycle education on addiction, healthy relationship skills  Summary: Ashley Gomez is a 45 y.o. female who presents with thinking that her boyfriend will not be her boyfriend anymore. She is in shock from what happpend. She bailed him and he has been out a couple of weeks. He has been different and he said it has been the stress knowing he could do a couple years. His hearing has been postponed. He went to doctor and he is in the hosptal for two stents. She found out he is a drug addict. She feels hurt and stupid. She is disappointed. She does not know what to think. She has been with him on and off for the past 7 years. She feels stupid for not knowing. She gave him money ahd she felt like contributed. She is caught because she wants to be there for hmii for surgery. She thinks it is a major treaut violation and doesn't any part of it.She doesn't want to be involved because she has grandbabies. it makes her chest hurt.She couldn't get to sleep thinking that she was stupid and feels a hyprocrite when she was putting people down using drugs and he was doing it. Discussed getting off of alcohol. She got sick and hurt from work. Drinking everyday and not too many people knew it. She decided that she would stop because it was not working. Reviewed the pros and cons of relationship. She is upset about his lying. Asked patient about way she copes and she sees says that talking about is helpful as she has no one to talk to  Suicidal/Homicidal: No  Therapist Response: Encouraged patient not to personalize what happened to her. Educated patient on addiction and  said one of the core elements of the diseases secrecy so she does not need to blame herself for her fianc who is using drugs and lying to her. Explored her feelings and process her feelings around finding out he was lying to her and helping her to review the pros and cons of the relationship. Reviewed stages of grief and explained to patient that she is still in early stage of shock when she found out the news in that get give herself some time to come to a decision about relationship. Validated patient on her feelings and experiences of negative consequences of family members involved with addiction in coming to her own decision of not continuing the relationship. Identified patient's strengths. Discussed thought distortion that leaves to intensity of depression and grief and current including personalizing, pervasiveness of thinking everything in her life is negative and per minute and thinking that things will continue to be bad always. Encouraged patient in activities that are self soothing as she goes to the shock of learning that her fianc was doing drugs. Provided emotionally supportive interventions  Plan: Return again in 1 weeks.2. Patient continue to learn and apply strategies to help manage her emotions.3. Patient use healthy relationship skills to work through recent problems in her relationship.  Diagnosis: Axis I: Post Traumatic Stress Disorder and Complicated grief, persistent depressive disorder    Axis II: No diagnosis    Dvonte Gatliff A, LCSW 11/30/2015

## 2015-12-01 ENCOUNTER — Telehealth (HOSPITAL_COMMUNITY): Payer: Self-pay | Admitting: *Deleted

## 2015-12-01 NOTE — Telephone Encounter (Signed)
Received fax from [facility] Patient Assistance. Pt has been approved for Abilify through 11/30/2016.

## 2015-12-02 ENCOUNTER — Ambulatory Visit (HOSPITAL_COMMUNITY): Payer: Self-pay | Admitting: Medical

## 2015-12-09 ENCOUNTER — Ambulatory Visit (INDEPENDENT_AMBULATORY_CARE_PROVIDER_SITE_OTHER): Payer: Medicare Other | Admitting: Licensed Clinical Social Worker

## 2015-12-09 DIAGNOSIS — Z634 Disappearance and death of family member: Secondary | ICD-10-CM

## 2015-12-09 DIAGNOSIS — F341 Dysthymic disorder: Secondary | ICD-10-CM | POA: Diagnosis not present

## 2015-12-09 DIAGNOSIS — F4329 Adjustment disorder with other symptoms: Secondary | ICD-10-CM

## 2015-12-09 DIAGNOSIS — F4310 Post-traumatic stress disorder, unspecified: Secondary | ICD-10-CM | POA: Diagnosis not present

## 2015-12-09 DIAGNOSIS — F4321 Adjustment disorder with depressed mood: Secondary | ICD-10-CM

## 2015-12-09 NOTE — Progress Notes (Signed)
   THERAPIST PROGRESS NOTE  Session Time: 2:05 PM to 2:55 PM  Participation Level: Active  Behavioral Response: CasualAlertDysphoric and Tearful  Type of Therapy: Individual Therapy  Treatment Goals addressed:  Coping, increase ability to tolerate PTSD symptoms  Interventions: Strength-based, Supportive, Reframing and Other: Processing of grief issues  Summary: Ashley Gomez is a 45 y.o. female who presents with relating that, boyfriend, [name] has not used any drugs and nothing showed up in his system. She will be there with him if he doesn't use. He is worried that he will lose her and he talks about it every day. He asks her to come see her every day. It is pretty bad at the house with her daughter it is getting worse. It is getting to the point where they don't want her to eat at the house. She asks what has asked her daughter what has changed and she said that patient doesn't talk and stays in her room. Doesn't want to talk about her sister who has died. Patient goes to the grave site every week. She will be able to share memories with Ashley Gomez. She gets angry at God for taking Ashley Gomez and wonders why. (She was very tearful and sharing this with therapist) This has a lot to do with anxiety. She feels better about boyfriend and wants to trust him. Mom is not enabling . She wants patient there all the time. She hopes that she knows it he does it again. She does not want to jump into anything until she is sure on her live her life with someone who has an addiction. She is waiting on the letter for disability to get a place. .   Suicidal/Homicidal: No  Therapist Response: Provided emotional support and help patient process feelings around her relationship. Validated patient in her choices about the relationship. Confirmed with patient that she needs to be cautious in this relationship before she is able to trust as staying clean is not a quick fix. Urged patient to see she has to be  her best support and not make it worse for herself by putting herself out of the negative situation which is her daughter's house. Continued to process patient's grief related to loss of her daughter. Therapist role-played with patient and was patient's daughter encouraging her that she needs to let go so that she can move on with her life to make the most of her life. Therapist explained that patient can't stay angry with God over something that she cannot control, that is the loss of her daughter. In helping patient come to acceptance explained that loss is a universal experience for everybody and beyond our control. We have to hold on to coping strategies that help [name] deal with the loss such as having faith.Provided positive feedback that patient is going to [place] and brainstormed on any other rituals that would be helpful.    Plan: Return again in 2 weeks.2. Patient continue to use session to process her feelings in session to work through grief process and to develop effective coping strategies.  Diagnosis: Axis I:  Post Traumatic Stress Disorder and Complicated grief, persistent depressive disorder    Axis II: No diagnosis    Ashley Gomez A, LCSW 12/09/2015

## 2015-12-14 ENCOUNTER — Ambulatory Visit (INDEPENDENT_AMBULATORY_CARE_PROVIDER_SITE_OTHER): Payer: Medicare Other | Admitting: Licensed Clinical Social Worker

## 2015-12-14 DIAGNOSIS — F4321 Adjustment disorder with depressed mood: Secondary | ICD-10-CM | POA: Diagnosis not present

## 2015-12-14 DIAGNOSIS — F4310 Post-traumatic stress disorder, unspecified: Secondary | ICD-10-CM | POA: Diagnosis not present

## 2015-12-14 DIAGNOSIS — F341 Dysthymic disorder: Secondary | ICD-10-CM

## 2015-12-14 DIAGNOSIS — F4329 Adjustment disorder with other symptoms: Secondary | ICD-10-CM

## 2015-12-14 DIAGNOSIS — Z634 Disappearance and death of family member: Secondary | ICD-10-CM

## 2015-12-14 NOTE — Progress Notes (Addendum)
   THERAPIST PROGRESS NOTE  Session Time: 2 PM to 2:55 PM  Participation Level: Active  Behavioral Response: CasualAlertDysphoric and Euthymic  Type of Therapy: Individual Therapy  Treatment Goals addressed:  Coping, increase ability to tolerate PTSD symptoms  Interventions: CBT, Supportive, Reframing and Other: Grief processing  Summary: Ashley Gomez is a 45 y.o. female who presents with good news. Her disability has been approved. This will help her get out of staying with her daughter which is a negative environment. She has fears about moving to new place. Spending more time with boyfriend. He has changed. He is paying for more and it has never been like that before. He is different in his attitude. Shared that they were outside camping and that this was a positive experience. Reviewed symptoms of grief and she said there is something every day that will make her feel overwhelmed. Related one of her triggers is [name]'s daughter who looks just like her. Shared that she needs to look at items from funeral and her daughter and this will help with healing. She realizes that her daughter would want her to get on with her life but explains that she misses her a lot. Related that she has a journal and we discussed ways that can be helpful in dealing with overwhelming emotions and helping to provide insight.     Suicidal/Homicidal: No  Therapist Response: Shared that patient's report of positive events would help with stressors and mood. Pointed out that positive developments were helpful given the difficulties patient has been experiencing. Encouraged patient to be cautious in relationship but at the same time to enjoy some of the positive changes. Reframed patient's plan to move and instead of being something she fears to be something that will be positive for her. Continue to work on processing feelings related to grief and trauma. Explained we were working on skills to help her deal with  overwhelming emotions so that she can have more control that will help her with trauma work. Being able to recognize and identifying emotions will help her have more control as well. Assigned Option B by Mart Piggs. Encouraged patient positive coping to look through things from her daughter and a funeral. Explained that emotions related to loss is also a way to remember that these relationships were important and you don't want to forget them.   Plan: Return again in 2 weeks.2. Patient read handout on emotional regulation skills.3. Patient read The book Option B by Mart Piggs to help her and grief process  Diagnosis: Axis I:  Post Traumatic Stress Disorder and Complicated grief, persistent depressive disorder     Axis II: No diagnosis    [name],[name], LCSW 12/14/2015

## 2015-12-23 ENCOUNTER — Encounter (HOSPITAL_COMMUNITY): Payer: Self-pay | Admitting: Medical

## 2015-12-23 ENCOUNTER — Ambulatory Visit (INDEPENDENT_AMBULATORY_CARE_PROVIDER_SITE_OTHER): Payer: Medicare Other | Admitting: Medical

## 2015-12-23 VITALS — BP 128/76 | HR 100 | Ht 66.0 in | Wt 261.0 lb

## 2015-12-23 DIAGNOSIS — F411 Generalized anxiety disorder: Secondary | ICD-10-CM | POA: Diagnosis not present

## 2015-12-23 DIAGNOSIS — F331 Major depressive disorder, recurrent, moderate: Secondary | ICD-10-CM | POA: Diagnosis not present

## 2015-12-23 DIAGNOSIS — Z634 Disappearance and death of family member: Secondary | ICD-10-CM

## 2015-12-23 DIAGNOSIS — F607 Dependent personality disorder: Secondary | ICD-10-CM

## 2015-12-23 DIAGNOSIS — G894 Chronic pain syndrome: Secondary | ICD-10-CM | POA: Diagnosis not present

## 2015-12-23 DIAGNOSIS — F4310 Post-traumatic stress disorder, unspecified: Secondary | ICD-10-CM | POA: Diagnosis not present

## 2015-12-23 DIAGNOSIS — F4321 Adjustment disorder with depressed mood: Secondary | ICD-10-CM

## 2015-12-23 MED ORDER — CLONAZEPAM 1 MG PO TABS: ORAL_TABLET | ORAL | 2 refills | Status: DC

## 2015-12-23 MED ORDER — TOPIRAMATE 100 MG PO TABS: ORAL_TABLET | ORAL | 0 refills | Status: DC

## 2015-12-23 MED ORDER — DULOXETINE HCL 60 MG PO CPEP: 60.0000 mg | ORAL_CAPSULE | Freq: Every day | ORAL | 3 refills | Status: DC

## 2015-12-23 MED ORDER — ARIPIPRAZOLE 10 MG PO TABS: 10.0000 mg | ORAL_TABLET | Freq: Two times a day (BID) | ORAL | 2 refills | Status: DC

## 2015-12-23 NOTE — Progress Notes (Addendum)
[name] MD/PA/NP OP Progress Note  09/16/2015  Ashley Gomez  MRN:  [id number]  Subjective: "I" got my SSDI Still anxious-under a lot of stress" Chief Complaint:  Chief Complaint    Follow-up; Medication Reaction; Depression; Stress; Trauma; Anxiety; Pain; Obesity; Unresolved grief     Visit Diagnosis:     ICD-9-CM ICD-10-CM   1. PTSD (post-traumatic stress disorder) 309.81 F43.10   2. Depression, major, recurrent, moderate (HCC) 296.32 F33.1 DULoxetine (CYMBALTA) 60 MG capsule  3. Anxiety state 300.00 F41.1   4. Chronic pain syndrome 338.4 G89.4   5. Dependent personality disorder 301.6 F60.7   6. Morbid obesity, unspecified obesity type ([place]) 278.01 E66.01   7. Grief at loss of child 309.0 F43.21     Past Medical History:  Past Medical History:  Diagnosis Date  . Anxiety   . CVA (cerebral infarction)    Old CVA on MRI brain  . Depression   . DVT (deep venous thrombosis) ([place]) [id number]   L basilic vein   . Facet hypertrophy of lumbar region    MRI 2007  . Fibromyalgia   . Hypertension    no meds now, lost 50lbs  . Migraines   . MS (multiple sclerosis) ([place])   . Neuromuscular disorder ([place])   . Obesity   . PTSD (post-traumatic stress disorder)   . Seizures ([place]) 2011   no seizure since onset  . Sleep apnea    diagnosed 20 ago, lost wt no CPAP now  . Smoking     Past Surgical History:  Procedure Laterality Date  . CHONDROPLASTY Left 12/30/2014   Procedure: CHONDROPLASTY;  Surgeon: Leandrew Koyanagi, MD;  Location: [place];  Service: Orthopedics;  Laterality: Left;  . KNEE ARTHROSCOPY Left 12/30/2014   Procedure: LEFT KNEE ARTHROSCOPY WITH   CHONDROPLASTY;  Surgeon: Leandrew Koyanagi, MD;  Location: [place];  Service: Orthopedics;  Laterality: Left;  . TONSILLECTOMY    . TUBAL LIGATION  1992   Family History:  Family History  Problem Relation Age of Onset  . Cancer Mother 54    melanoma  . Hypertension Sister   . Heart disease Sister       valve disease  . Depression Sister   . Diabetes Sister   . Sjogren's syndrome Sister    Social History:  Social History   Social History  . Marital status: Single    Spouse name: N/A  . Number of children: N/A  . Years of education: N/A   Social History Main Topics  . Smoking status: Current Every Day Smoker    Packs/day: 1.00    Years: 25.00    Types: Cigarettes  . Smokeless tobacco: Never Used     Comment: advised to d/c by 3 cigs per week.  . Alcohol use No     Comment: occasional  . Drug use: No  . Sexual activity: No   Other Topics Concern  . None   Social History Narrative  . None   Additional History  Lequita Asal, LCSW  Licensed Clinical Social Worker   PTSD (post-traumatic stress disorder) +2 more  Dx   Referred by Hali Marry, MD  Reason for Visit   Progress Notes  Sensitive      THERAPIST PROGRESS NOTE   Session Time: 2 PM to 2:55 PM Participation Level: Active Behavioral Response: CasualAlertDysphoric and Euthymic Type of Therapy: Individual Therapy Treatment Goals addressed:  Coping, increase ability to tolerate PTSD symptoms Interventions:  CBT, Supportive, Reframing and Other: Grief processing   Summary: Ashley Gomez is a 45 y.o. female who presents with good news. Her disability has been approved. This will help her get out of staying with her daughter which is a negative environment. She has fears about moving to new place. Spending more time with boyfriend. He has changed. He is paying for more and it has never been like that before. He is different in his attitude. Shared that they were outside camping and that this was a positive experience. Reviewed symptoms of grief and she said there is something every day that will make her feel overwhelmed. Related one of her triggers is [name]'s daughter who looks just like her. Shared that she needs to look at items from funeral and her daughter and this will help with healing. She  realizes that her daughter would want her to get on with her life but explains that she misses her a lot. Related that she has a journal and we discussed ways that can be helpful in dealing with overwhelming emotions and helping to provide insight.    Suicidal/Homicidal: No Therapist Response: Shared that patient's report of positive events would help with stressors and mood. Pointed out that positive developments were helpful given the difficulties patient has been experiencing. Encouraged patient to be cautious in relationship but at the same time to enjoy some of the positive changes. Reframed patient's plan to move and instead of being something she fears to be something that will be positive for her. Continue to work on trauma related grief. Explained we were working on skills to help her deal with overwhelming emotions social have more control that will help her with trauma work. Assigned Option B by Mart Piggs. Help patient process emotions around grief. Synetta Shadow patient and insight that the more she is able to focus on what's going on in her life more that will help with her grief. Coverage patient positive coping to look through things from her daughter and a funeral. Explained that since emotions related to loss is also a way to remember that these relationships were important and you don't want to forget them. Plan: Return again in 2 weeks.2. Patient read handout on emotional regulation skills.3. Patient read The book Option B by Mart Piggs to help her and grief process   Diagnosis:                Axis I:  Post Traumatic Stress Disorder and Complicated grief, persistent                                                 depressive disorder                                                                         Axis II: No diagnosis  [name],[name], LCSW 12/14/2015       Office Visit   10/28/2015 [facility]  Hali Marry, MD  Family Medicine    Routine general medical examination at a health care facility +4 more  Dx   Annual Exam; Referred by Hali Marry, MD  Reason for Visit    Assessment:     Healthy female exam.     Plan:   See After Visit Summary for Counseling Recommendations   Keep up a regular exercise program and make sure you are eating a healthy diet Try to eat 4 servings of dairy a day, or if you are lactose intolerant take a calcium with vitamin D daily.  Your vaccines are up to date.  Call with Pap smear results once available. Schedule for pelvic ultrasound since she did have some tenderness in the left lower quadrant with pelvic exam. Schedule mammogram. Due for screening labs.       Instructions Return in about 2 months (around 12/28/2015) for pain management.  Keep up a regular exercise program and make sure you are eating a healthy diet Try to eat 4 servings of dairy a day, or if you are lactose intolerant take a calcium with vitamin D daily.  Your vaccines are up to date.          Musculoskeletal: Strength & Muscle Tone: Normal Gait & Station: No longer favoring Patient leans: N/A  Psychiatric Specialty Exam: HPI Pt returns for scheduled FU for her PTSD and associated mental health problems of anxiety and depression.Still complaining of significant anxiety she says is caused by having to live with her surviving daughter who "treats me like I'm nothing" Says she is not allowed to speak of deceased younger daughter or to grieve openly.Daughter refuses to discuss with her mother and refuses to attend counseling. Looking forward to buying house with SS settlement. Continue counseling here .Continues medical care and pain management with Dr Suzi Roots  Review of Systems  Constitutional: Positive malaise/fatigue improved. Negative for fever, chills, weight loss and diaphoresis.       APPLYING FOR SSDI  Musculoskeletal: Positive for myalgias, back pain chronic , Joint pain improved.   Neurological: Positive for past migraine treated at PCP office  Endo/Heme/Allergies: Negative for environmental allergies and polydipsia. Does not bruise/bleed easily.  Psychiatric/Behavioral: Positive for depression with improvement but c/o anxiety per HPI Negative for suicidal ideas, hallucinations, memory loss and substance abuse. The patient is not nervous/anxious.   CPTSD-does not BELIEVE she can get over daughter's death but is continuing counseling    Blood pressure 128/76, pulse 100, height 5\' 6"  (1.676 m), weight 261 lb (118.4 kg), last menstrual period 12/28/2014, SpO2 95 %.Body mass index is 42.13 kg/m.  General Appearance: Well Groomed ;Neat  Eye Contact:  Good  Speech:  Clear and Coherent and hesitant at times  Volume:  Normal  Mood:  Euthymic with some flatness  Affect:  Congruent  Thought Process:  Negative;traumatic  Orientation:  Full (Time, Place, and Person)  Thought Content:  Delusions, Obsessions and Rumination  Suicidal Thoughts:  No  Homicidal Thoughts:  No  Memory:  Intact  Judgement:  Impaired  Insight:  Lacking  Psychomotor Activity: Improved  Concentration:  Intact for visit  Recall:  Good  Fund of Knowledge: Poor  Language: Fair  Akathisia:  NA  Handed:  Right  AIMS (if indicated):  NA  Assets:  Desire for Improvement despite belief she cant get well  ADL's:  Intact  Cognition: Impaired,  Severe  Sleep:  With  meds   Is the patient at risk to self?  No. Has the patient been a risk to self in the past 6 months?  No. Has the patient been a  risk to self within the distant past?  No. Is the patient a risk to others?  No. Has the patient been a risk to others in the past 6 months?  No. Has the patient been a risk to others within the distant past?  No.  Current Medications: Current Outpatient Prescriptions  Medication Sig Dispense Refill  . ARIPiprazole (ABILIFY) 10 MG tablet Take 1 tablet (10 mg total) by mouth 2 (two) times daily. for adjunctive  treatment of depression. 60 tablet 2  . clonazePAM (KLONOPIN) 1 MG tablet DoNotFill until 01/10/2016 NO EARLY REFILLS ( Refill 9/28 /17 and 03/11/16) 90 tablet 2  . cyclobenzaprine (FLEXERIL) 10 MG tablet Take 1 tablet (10 mg total) by mouth 3 (three) times daily. 90 tablet 3  . DULoxetine (CYMBALTA) 60 MG capsule Take 1 capsule (60 mg total) by mouth daily. 30 capsule 3  . gabapentin (NEURONTIN) 400 MG capsule Take 2-3 caps in am 2-3 caps in pm and 2-3 caps HS 270 capsule 2  . hydrochlorothiazide (HYDRODIURIL) 25 MG tablet take 1 tablet by mouth once daily if needed 30 tablet 1  . HYDROcodone-acetaminophen (NORCO) 7.5-325 MG tablet Take 1 tablet by mouth every 8 (eight) hours as needed for moderate pain. OK to fill 11/27/15. 90 tablet 0  . SUMAtriptan (IMITREX) 100 MG tablet Take 1 tablet (100 mg total) by mouth every 2 (two) hours as needed for migraine. No more than 2 in 24 hours. 10 tablet 6  . phentermine 15 MG capsule take 1 capsule by mouth once daily with BREAKFAST  0  . topiramate (TOPAMAX) 100 MG tablet Take 1 tablet in am and 2 tablets in PM 90 tablet 0  . Topiramate ER (TROKENDI XR) 100 MG CP24 Take 3 caps daily (Patient not taking: Reported on 12/23/2015) 30 capsule 2   No current facility-administered medications for this visit.     Medical Decision Making:  Review and summation of old records (2), Established Problem, Worsening (2), Review of Last Therapy Session (1) and Review of Medication Regimen & Side Effects (2)  Treatment Plan Summary: Pt given  refills on psychiatric meds .Marland KitchenShe will continue her COUNSELING at [facility]. MS FU per Dr Madilyn Fireman.Encouraged her and praised her progress.FU 3 months -sooner if needed   Darlyne Russian 12/23/2015, 1:56 PM  PT CALLED [facility] OFFICE CLAIMING KLONOPIN RX  INCORRECT AND THAT SHE HAD BEEN TOLD TO INCREASE HER DOSE-ALL OF WHICH IS FALSE .WILL TAPER HER OFF KLONOPIN AND RECOMMEND  AND COUNSELING /RX FOR SUBSTANCE ABUSE ISSUES AS  SHE HAS A HISTORY OF ABUSE/ADDICTION AND UNFORTUNATELY ISNT SATISFIED TO USE LIMITED QUANTITIES WITHOUT SEEKING HIGHER DOSES AS HOPED FOR  .  IF SHE INCREASES HER DOSE AND RUNS OUT EARLY WILL DISCHARGE HER TO FIND ANOTHER PROVIDER.SHE COULD CONTINUE COUNSELING REGARDLESS

## 2015-12-28 ENCOUNTER — Encounter: Payer: Self-pay | Admitting: Family Medicine

## 2015-12-28 ENCOUNTER — Ambulatory Visit (INDEPENDENT_AMBULATORY_CARE_PROVIDER_SITE_OTHER): Payer: Self-pay | Admitting: Family Medicine

## 2015-12-28 VITALS — BP 126/73 | HR 88 | Resp 16 | Ht 66.0 in | Wt 268.0 lb

## 2015-12-28 DIAGNOSIS — G894 Chronic pain syndrome: Secondary | ICD-10-CM

## 2015-12-28 DIAGNOSIS — M7918 Myalgia, other site: Secondary | ICD-10-CM

## 2015-12-28 DIAGNOSIS — M170 Bilateral primary osteoarthritis of knee: Secondary | ICD-10-CM

## 2015-12-28 DIAGNOSIS — M791 Myalgia: Secondary | ICD-10-CM

## 2015-12-28 DIAGNOSIS — Z6841 Body Mass Index (BMI) 40.0 and over, adult: Secondary | ICD-10-CM

## 2015-12-28 DIAGNOSIS — Z780 Asymptomatic menopausal state: Secondary | ICD-10-CM

## 2015-12-28 MED ORDER — METFORMIN HCL 500 MG PO TABS
500.0000 mg | ORAL_TABLET | Freq: Two times a day (BID) | ORAL | 3 refills | Status: DC
Start: 2015-12-28 — End: 2016-02-29

## 2015-12-28 MED ORDER — HYDROCODONE-ACETAMINOPHEN 7.5-325 MG PO TABS: 1.0000 | ORAL_TABLET | Freq: Three times a day (TID) | ORAL | 0 refills | Status: DC | PRN

## 2015-12-28 MED ORDER — CYCLOBENZAPRINE HCL 10 MG PO TABS
10.0000 mg | ORAL_TABLET | Freq: Every evening | ORAL | 0 refills | Status: DC | PRN
Start: 2015-12-28 — End: 2016-02-29

## 2015-12-28 NOTE — Progress Notes (Signed)
Subjective:    CC: Pain management  HPI:  Chronic pain management of her low back and bilateral knees. She is currently on hydrocodone 3 tabs daily. Her last urine drug screen was March, 5 mo ago. At one point was considering knee replacement. She is waiting to hear back about the financial end of it. She is also currently on gabapentin to help control her pain. And she also uses Flexeril as needed. She's also on Cymbalta.   Fascial pain syndrome-she's also requesting a refill on her Flexeril. She was previously getting this from Dr. Dianah Field. I can refill the medication but Her that taking it 3 times a day chronically is way too much medication and to sedating. I will refill it but will be written as one a day at bedtime only and I informed her of the change on the prescription.  She also wanted to follow-up on her ultrasound results. She came in for her physical and she had some tenderness on pelvic exam so we scheduled her for pelvic ultrasound. The results were reviewed by Dr. Sheppard Coil as I was out of town. There was some slight thickening of the endometrium. It was within the upper limits of normal. The radiologist recommended repeating in 3 months. She is amenorrheic and so has not had a period in quite some time. Her hormones support that she is likely postmenopausal.  She is also concerned because she continues to gain weight. She says she only eats once a day and usually gets less than 1000 cal a days and doesn't understand why she is not losing weight. She does not exercise regularly because of her back and joint pain. We tried phentermine several months ago and it was not effective.    Past medical history, Surgical history, Family history not pertinant except as noted below, Social history, Allergies, and medications have been entered into the medical record, reviewed, and corrections made.   Review of Systems: No fevers, chills, night sweats, weight loss, chest pain, or shortness  of breath.   Objective:    General: Well Developed, well nourished, and in no acute distress.  Neuro: Alert and oriented x3, extra-ocular muscles intact, sensation grossly intact.  HEENT: Normocephalic, atraumatic  Skin: Warm and dry, no rashes. Cardiac: Regular rate and rhythm, no murmurs rubs or gallops, no lower extremity edema.  Respiratory: Clear to auscultation bilaterally. Not using accessory muscles, speaking in full sentences.   Impression and Recommendations:   osteoarthritis of both knees and spine-repeat urine drug screen today. She had very low levels of hydrocodone in her last urine drug screen in March considering that she is taking the medication 3 times a day very regularly and would like to repeat that today.  Fibromyalgia-will refill Flexeril but will decrease use down to once a day at bedtime. Explained that it is too risky and sedating to take it 3 times a day chronically.  Postmenopausal with borderline thinning of the endometrium. Recommend repeat ultrasound in 3 months. If continuing to thicken then consider referral to GYN for endometrial biopsy.  Obesity/BMI 43-discussed options. The most important thing is that she needs to eat more regularly. Explained the eating once a day actually sliced metabolism down. Recommend that she should for at least 1200 cal a day and spread this out over at least 4 meals per day. Also encouraged her to start increasing her activity level and exercising as much as she is able to with her knees etc. We certainly could try metformin. Warned about  potential side effects including diarrhea.

## 2016-01-03 LAB — PAIN MGMT, PROFILE 6 CONF W/O MM, U
6 Acetylmorphine: NEGATIVE ng/mL (ref <10)
Alcohol Metabolites: NEGATIVE ng/mL (ref <500)
Alphahydroxyalprazolam: NEGATIVE ng/mL (ref <25)
Alphahydroxymidazolam: NEGATIVE ng/mL (ref <50)
Alphahydroxytriazolam: NEGATIVE ng/mL (ref <50)
Aminoclonazepam: 436 ng/mL — ABNORMAL HIGH (ref <25)
Amphetamines: NEGATIVE ng/mL (ref <500)
Barbiturates: NEGATIVE ng/mL (ref <300)
Benzodiazepines: POSITIVE ng/mL — AB (ref <100)
Cocaine Metabolite: NEGATIVE ng/mL (ref <150)
Codeine: NEGATIVE ng/mL (ref <50)
Creatinine: 64.5 mg/dL (ref >=20.0)
Hydrocodone: 150 ng/mL — ABNORMAL HIGH (ref <50)
Hydromorphone: 54 ng/mL — ABNORMAL HIGH (ref <50)
Hydroxyethylflurazepam: NEGATIVE ng/mL (ref <50)
Lorazepam: NEGATIVE ng/mL (ref <50)
Marijuana Metabolite: NEGATIVE ng/mL (ref <20)
Methadone Metabolite: NEGATIVE ng/mL (ref <100)
Morphine: NEGATIVE ng/mL (ref <50)
Nordiazepam: NEGATIVE ng/mL (ref <50)
Norhydrocodone: 355 ng/mL — ABNORMAL HIGH (ref <50)
Opiates: POSITIVE ng/mL — AB (ref <100)
Oxazepam: NEGATIVE ng/mL (ref <50)
Oxidant: NEGATIVE ug/mL (ref <200)
Oxycodone: NEGATIVE ng/mL (ref <100)
Phencyclidine: NEGATIVE ng/mL (ref <25)
Please note:: 0
Temazepam: NEGATIVE ng/mL (ref <50)
pH: 7.11 (ref 4.5–9.0)

## 2016-01-04 ENCOUNTER — Ambulatory Visit (INDEPENDENT_AMBULATORY_CARE_PROVIDER_SITE_OTHER): Payer: Medicare Other | Admitting: Licensed Clinical Social Worker

## 2016-01-04 DIAGNOSIS — F341 Dysthymic disorder: Secondary | ICD-10-CM

## 2016-01-04 DIAGNOSIS — F4329 Adjustment disorder with other symptoms: Secondary | ICD-10-CM

## 2016-01-04 DIAGNOSIS — F4321 Adjustment disorder with depressed mood: Secondary | ICD-10-CM | POA: Diagnosis not present

## 2016-01-04 DIAGNOSIS — F4310 Post-traumatic stress disorder, unspecified: Secondary | ICD-10-CM | POA: Diagnosis not present

## 2016-01-04 DIAGNOSIS — Z634 Disappearance and death of family member: Secondary | ICD-10-CM

## 2016-01-04 NOTE — Progress Notes (Signed)
   THERAPIST PROGRESS NOTE  Session Time: 1 PM to 1:45 PM  Participation Level: Active  Behavioral Response: CasualAlertDysphoric, Euthymic and Tearful  Type of Therapy: Individual Therapy  Treatment Goals addressed:  Coping, increase ability to tolerate PTSD symptoms  Interventions: Strength-based, Supportive, Anger Management Training and Other: Interventions to process grief and trauma  Summary: Ashley Gomez is a 45 y.o. female who related that sister was here for visit but says that she is unable to talk to family about her grief. She wrote in her journal because she couldn't talk to sister. She feels that she has to hide her feelings. Shared with therapist that she found the house that she likes and plans to make an offer. She is trying to get excited about it. She identified a positive that when she moves she won't have to hide her feelings. Shared that it would be helpful to share stories with others about loses. If she goes thorugh Grief Share it might help more now because she was angry then. She she still has significant work to do is to work through her painful emotions. She relates that she cries almost every day and if anything can bring up pain and crying. Recognizes that she can't be mom and grandmother she  needs to be so she needs to find a way to work through grief. Recognizes she still has anger . She does catch her self at times laughing but feels guilty. Shared that if friend gave her her book about empathy and sympathy and is found some insights from it.  Suicidal/Homicidal: No  Therapist Response: Reviewed progress with patient and provided positive feedback about her self report of recognizing progress she has made. Worked on building insight of how she could find ways to transform trauma and grief in her life to find purpose and meaning. Suggested for example to find a way to connect with her daughter, Ashley Gomez, through grandchildren and nourish this relationship.  Encouraged patient to keep track and find moments of joy. Encouraged patient to stay focused on her life and enjoys some of the positive developments. Supported patient in her insight of significance of her own life and recognizing her need to be there as a grandmother and mom. Provided perspective that grief process involved in therapy is to continues to be to talk about her loss and express her emotions in treatment to work through them. Identified it is positive that she does this in therapy. Educated patient on anger and discussed how behind all anger is unmet needs to help patient manage her own anger as well as insight about her relationship and conflict with daughter who she lives with.   Plan: Return again in 1 week.2. Continue to help patient process traumatic grief. 3. Patient continue to learn and apply coping strategies to help her progress on treatment goals  Diagnosis: Axis I:  Post Traumatic Stress Disorder and Complicated grief, persistent depressive disorder    Axis II: No diagnosis    [name],[name], LCSW 01/04/2016

## 2016-01-05 ENCOUNTER — Telehealth (HOSPITAL_COMMUNITY): Payer: Self-pay | Admitting: *Deleted

## 2016-01-05 NOTE — Telephone Encounter (Signed)
Called and informed pt Abilify prescription is ready for pickup.

## 2016-01-11 ENCOUNTER — Ambulatory Visit (INDEPENDENT_AMBULATORY_CARE_PROVIDER_SITE_OTHER): Payer: Medicare Other | Admitting: Licensed Clinical Social Worker

## 2016-01-11 DIAGNOSIS — F341 Dysthymic disorder: Secondary | ICD-10-CM | POA: Diagnosis not present

## 2016-01-11 DIAGNOSIS — F4329 Adjustment disorder with other symptoms: Secondary | ICD-10-CM

## 2016-01-11 DIAGNOSIS — F4321 Adjustment disorder with depressed mood: Secondary | ICD-10-CM | POA: Diagnosis not present

## 2016-01-11 DIAGNOSIS — Z634 Disappearance and death of family member: Secondary | ICD-10-CM

## 2016-01-11 DIAGNOSIS — F4310 Post-traumatic stress disorder, unspecified: Secondary | ICD-10-CM

## 2016-01-11 NOTE — Progress Notes (Signed)
   THERAPIST PROGRESS NOTE  Session Time: 1 PM to 1:55 PM  Participation Level: Active  Behavioral Response: CasualAlertDysphoric and Tearful  Type of Therapy: Individual Therapy  Treatment Goals addressed:  Coping, increase ability to tolerate PTSD symptoms  Interventions: Strength-based, Supportive, Reframing and Other: Developing insight and helping patient was grieving process  Summary: Ashley Gomez is a 45 y.o. female who presents with sharing from the book she is reading titled "Finding the Lost Art of Empathy". In the book the Pryor Curia talked about grief and how it impacted her that people would walk away who she has known all her life. Patient could relate this to her own life when people would come up to her shake their head and not know what to say to her. Related that it is like reopening the wound that hasn't healed. The limited support doesn't help. Relates that it doesn't help because [name]'s daughter at home is like [name] reincarnated and this is hard for her to deal with. Shared trigger thoughts that she doesn't know why her daughter was taken and not her. Describes not feeling like she has an identity. Tearful and expressing these feelings. Related that it helps with her someone to listen, gives her feedback and shows some understanding. Gave therapist history of being her on her own and raising her 2 girls. Related that she has years of frustration she has to work through. Discussed mom's lack of nurturing throughout her life and pain she is caused her that she has put in the past. [name] she could forgive her if she apologizes book and never forget. Related she has someone in her life is supportive who is also dealt with grief and this helped and suggestions she is given her.    Suicidal/Homicidal: No  Therapist Response: Provided insight to the grieving process and that she would eventually she would come to a point where there would be a bottom and grieving would get  easier. Discussed the value of finding supports who understand what she is going through to provide insight and support. Encouraged patient not to control her grieving but that it would and fold in its own way and to rather listen to it. Asked patient to think about that while grief was unavoidable, there were things she could do to lessen the anguish. Supported patient in agreeing that it doesn't help to be an environment that denies one's feelings but that one has to respect feelings to help work through them. Discussed that resiliency as something that can grow and not just something that we have. Identified thoughts that worsen patient's sadness. Therapist countered by encouraging patient to remember limited control over events in life and the need to value her own life. Continue to use reframing to help patient deal with triggers for grief. Used strength-based interventions to help with coping   Plan: Return again in 2 weeks.2. Patient continue to develop insight and developing coping skills to help her through the grieving process  Diagnosis: Axis I:  Post Traumatic Stress Disorder and Complicated grief, persistent depressive disorder    Axis II: No diagnosis    [name],[name], LCSW 01/11/2016

## 2016-01-18 ENCOUNTER — Ambulatory Visit (INDEPENDENT_AMBULATORY_CARE_PROVIDER_SITE_OTHER): Payer: Medicare Other | Admitting: Licensed Clinical Social Worker

## 2016-01-18 ENCOUNTER — Encounter (HOSPITAL_COMMUNITY): Payer: Self-pay | Admitting: Licensed Clinical Social Worker

## 2016-01-18 DIAGNOSIS — F341 Dysthymic disorder: Secondary | ICD-10-CM

## 2016-01-18 DIAGNOSIS — F4321 Adjustment disorder with depressed mood: Secondary | ICD-10-CM

## 2016-01-18 DIAGNOSIS — F4310 Post-traumatic stress disorder, unspecified: Secondary | ICD-10-CM

## 2016-01-18 DIAGNOSIS — F4329 Adjustment disorder with other symptoms: Secondary | ICD-10-CM

## 2016-01-18 DIAGNOSIS — Z634 Disappearance and death of family member: Secondary | ICD-10-CM

## 2016-01-18 NOTE — Progress Notes (Addendum)
   THERAPIST PROGRESS NOTE  Session Time: 10 AM to 10:45 AM  Participation Level: Active  Behavioral Response: CasualAlertEuthymic  Type of Therapy: Individual Therapy  Treatment Goals addressed:  Coping, increase ability to tolerate PTSD symptoms, decrease in depressive symptoms  Interventions: Strength-based, Supportive and Other: Grief processing  Summary: Ashley Gomez is a 45 y.o. female who presents with relating to therapist that in a couple of days and will be 3 years anniversary of her daughter's death.  in a couple of years it will be three years and trying not to think about it. Reviewed a book patient asked therapist to read titled "Finding the Lost Art of Empathy". Discussed how all the author wanted who also went through loss and grief was to genuinely connect with others and all she needed was empathy.  Patient shared that it was hurtful and hard that she cannot talk to people she was close with about her grief. She says this is one of the reasons that therapy is helpful. Discussed the author's journey that led her to forgiveness of the person she loss and also herself and that this offered freedom. Patient realizes that she is staying angry. She holds anger against her daughter autumn and also there is anger toward herself. She also said there is anger toward her mom. She is unsure what it will take to forgive but believes that when she was out on her own it will be helpful but not sure why. She also sees in her daughter, autumn, that she has anger and she is turning into an angrier person since the loss of her sister. Reviewed current stressors with therapist and that she still continues to wait for money so that she can move into her house.   Suicidal/Homicidal: No  Therapist Response: Discussed the book "Finding the Lost Art of Empathy" that patient had brought to session and asked therapist to read. Discussed empathy as being able to put oneself in another's perspective and  being present for the person's experiences of pain. Identified that the officers value of empathy and feeling connected happens in therapy and why it has been helpful for patient. Therapist's explained that this relationship is helpful as patient goes through her journey of self discovery through grief. Identified the path of forgiveness of self and others as helpful to finding freedom and hope for patient in her journey but also identified that anger still being present and needing to be work through. Provided emotional support and helped patient process through feelings in session.   Plan: Return again in 1 week.2. Patient continue to use session to process through feelings and make progress in working through grief  Diagnosis: Axis I:  Post Traumatic Stress Disorder and Complicated grief, persistent depressive disorder    Axis II: No diagnosis    Telford Archambeau A, LCSW 01/18/2016

## 2016-01-25 ENCOUNTER — Ambulatory Visit (HOSPITAL_COMMUNITY): Payer: Self-pay | Admitting: Licensed Clinical Social Worker

## 2016-01-26 ENCOUNTER — Other Ambulatory Visit: Payer: Self-pay

## 2016-01-26 MED ORDER — HYDROCODONE-ACETAMINOPHEN 7.5-325 MG PO TABS: 1.0000 | ORAL_TABLET | Freq: Three times a day (TID) | ORAL | 0 refills | Status: DC | PRN

## 2016-02-01 ENCOUNTER — Ambulatory Visit (INDEPENDENT_AMBULATORY_CARE_PROVIDER_SITE_OTHER): Payer: Medicare Other | Admitting: Licensed Clinical Social Worker

## 2016-02-01 DIAGNOSIS — F341 Dysthymic disorder: Secondary | ICD-10-CM

## 2016-02-01 DIAGNOSIS — F4321 Adjustment disorder with depressed mood: Secondary | ICD-10-CM

## 2016-02-01 DIAGNOSIS — F4310 Post-traumatic stress disorder, unspecified: Secondary | ICD-10-CM

## 2016-02-01 DIAGNOSIS — F4329 Adjustment disorder with other symptoms: Secondary | ICD-10-CM

## 2016-02-01 DIAGNOSIS — Z634 Disappearance and death of family member: Secondary | ICD-10-CM

## 2016-02-01 DIAGNOSIS — F4381 Prolonged grief disorder: Secondary | ICD-10-CM

## 2016-02-01 NOTE — Progress Notes (Addendum)
   THERAPIST PROGRESS NOTE  Session Time: 1 PM to 1:50 PM  Participation Level: Active  Behavioral Response: CasualAlertDysphoric  Type of Therapy: Individual Therapy  Treatment Goals addressed: Coping, increase ability to tolerate PTSD symptoms, decrease in depressive symptoms  Interventions: Solution Focused, Strength-based, Supportive, Family Systems, Reframing and Other: Grief processing  Summary: Ashley Gomez is a 45 y.o. female who shared that her daughter has kicked her out of her house. She feels that she has dealt with her emotions related to the loss of her sister but also thinks this negative aspects of her personality has been developing. Relates that she is glad that she was able to share with her daughter that she was tired of how she was being treated but did not like the way it came out. Shared that she does not deserve to be talked to and treated the way she does. She describes crying and feeling depressed but also said it was a release. Described depressed because in the morning she will be able to see the kids and not being able to see her daughter. She says she'll be okay if she stayed with her boyfriend and they are looking for a house. She hopes her daughter takes ownership of what she did this does not expect it. It hurts not to be around the kids but also hurts to be around someone who is so hateful. It causes her to miss [name] more and feels like they were both alike. Related that third year anniversary of losing her mother happen September 7. She hopes her daughter, autumn, will take a step back and take a look at things and through consequences of her actions recognize her mistakes. Discussed how medications help her with depressive symptoms  Suicidal/Homicidal: No  Therapist Response: In relation to fight with daughter therapist shared that emotions have been building up and at some point would have to find a way to release. Discussed how anger ties to grieving and  that despite it being hurtful to fight with her daughter that it may be helpful step in healing. Provided positive feedback for patient standing up for herself and telling her daughter was not okay the way she was treating her. Reviewed depressive symptoms and related that even though patient still experienced depressive symptoms pointed out as things come together life she may find that her emotions will follow and she'll start to feel better about thing. Pointed out positive developments for patient such as getting an income soon and looking into buying a house. Provided emotional support, strength-based interventions and help patient process emotions in session. Validated patient on ability to make healthy choices for herself. Reviewed medications and discussed that she might find she feels better if she cuts back and on numerous sedating medications that were also help and not continuing to gain weight. Reviewed pros and cons of this.     Plan: Return again in 2 weeks.2. Patient continue to work through grief and trauma 3. Patient continue to find strategies to work through unhealthy family dynamics  Diagnosis: Axis I:  Post Traumatic Stress Disorder and Complicated grief, persistent depressive disorder    Axis II: No diagnosis    [name],[name], LCSW 02/01/2016

## 2016-02-07 ENCOUNTER — Telehealth (HOSPITAL_COMMUNITY): Payer: Self-pay | Admitting: *Deleted

## 2016-02-07 NOTE — Telephone Encounter (Signed)
Called and informed pt Abilify prescription is ready for pickup.

## 2016-02-14 ENCOUNTER — Ambulatory Visit (INDEPENDENT_AMBULATORY_CARE_PROVIDER_SITE_OTHER): Payer: Medicare Other | Admitting: Licensed Clinical Social Worker

## 2016-02-14 DIAGNOSIS — F4329 Adjustment disorder with other symptoms: Secondary | ICD-10-CM

## 2016-02-14 DIAGNOSIS — F341 Dysthymic disorder: Secondary | ICD-10-CM | POA: Diagnosis not present

## 2016-02-14 DIAGNOSIS — Z634 Disappearance and death of family member: Secondary | ICD-10-CM | POA: Diagnosis not present

## 2016-02-14 DIAGNOSIS — F4310 Post-traumatic stress disorder, unspecified: Secondary | ICD-10-CM

## 2016-02-14 DIAGNOSIS — F4321 Adjustment disorder with depressed mood: Secondary | ICD-10-CM

## 2016-02-14 NOTE — Progress Notes (Signed)
   THERAPIST PROGRESS NOTE  Session Time: 1 PM to 1:50 PM  Participation Level: Active  Behavioral Response: CasualAlertDysphoric  Type of Therapy: Individual Therapy  Treatment Goals addressed:  Coping, increase ability to tolerate PTSD symptoms, decrease in depressive symptoms  Interventions: Solution Focused, Strength-based, Supportive, Family Systems and Other: Skills and interpersonal relationships, processing of emotions  Summary: Ashley Gomez is a 45 y.o. female who presents with not doing great. Reviewed conflict with her daughter and relates she has nothing to apologize for. She is ready to move past the conflict but daughter still is not talking to her. Her daughter is mad and blames patient but patient is unsure what she's done wrong. She admits that it helps to be out of the environment but it hurts not to be around her kids and said she's never been away from kids. Moving to a house will help her life move in a positive direction. Depression moderately severe and other stressors contributing besides grief. She recognizes she needs to be protective of herself in her relationships including her kids. Realize that kids are going to be who they are. She can't believe she is letting her live in a tent. She thinks that she has come along way since she started therapy.   Suicidal/Homicidal: No  Therapist Response: Reaffirmed that her daughter's anger and conflict in the relationship has more to do with her daughter's issues and anger toward self. Provided positive feedback for positive steps patient is taking to separate from the negative environment and be in her own place. Reviewed progress and pH Q-9 she scored 19 indicating a moderately severe depression which is an improvement from severe depression. Additionally patient does endorse doing a lot better than when she started therapy. Help patient process her feelings related to anger and hurt regarding conflict with her daughter.  Explored underlying sources of conflict in order for patient to better develop coping strategies and patient not to hold onto and work through any guilt. Encouraged patient with healthy relationship skills to help protect herself and solving herself from responsibility for choices her adult children have made.   Plan: Return again in 1 week.2. Patient continue to learn and implement strategies to manage depression and interpersonal relationships  Diagnosis: Axis I:  Post Traumatic Stress Disorder and Complicated grief, persistent depressive disorder     Axis II: No diagnosis    Lakendria Nicastro A, LCSW 02/14/2016

## 2016-02-22 ENCOUNTER — Ambulatory Visit (INDEPENDENT_AMBULATORY_CARE_PROVIDER_SITE_OTHER): Payer: Medicare Other | Admitting: Licensed Clinical Social Worker

## 2016-02-22 DIAGNOSIS — F4329 Adjustment disorder with other symptoms: Secondary | ICD-10-CM

## 2016-02-22 DIAGNOSIS — F341 Dysthymic disorder: Secondary | ICD-10-CM | POA: Diagnosis not present

## 2016-02-22 DIAGNOSIS — Z634 Disappearance and death of family member: Secondary | ICD-10-CM

## 2016-02-22 DIAGNOSIS — F4321 Adjustment disorder with depressed mood: Secondary | ICD-10-CM

## 2016-02-22 NOTE — Progress Notes (Signed)
   THERAPIST PROGRESS NOTE  Session Time: 2 PM to 2:30 PM  Participation Level: Active  Behavioral Response: CasualAlertDysphoric  Type of Therapy: Individual Therapy  Treatment Goals addressed: Coping, increase ability to tolerate PTSD symptoms, decrease in depressive symptoms  Interventions: Solution Focused, Strength-based, Supportive and Other: Healthy relationship skills  Summary: Ashley Gomez is a 45 y.o. female who presents with feeling sick but still was able to push herself to come to session. Updated therapist from last session and related that daughter is not talking to her. She goes by to visit the grand kids but does not see them as much. Her son is supportive with all that is going on. It hurst to not be around the kids as much. Moving to the house will move her life in a postive direction. Updated therapist about her relationship with her boyfriend.  Boyfriend has gambling problems and she is made up her mind that she will not stay in relationship where she will struggle with financial issues. She has warned him of her concerns. Shared that she does not see him working toward anything. People he hangs with are gamblers and drinkers and not great characters.Related that she felt good about her medicines not being $200.   Suicidal/Homicidal: No  Therapist Response: Reviewed symptoms since last session. Provided supportive interventions. Shared that it helps mental health symptoms to see positive changes in one's life. Reviewed relationship issues and related that patient shows good self-care and setting boundaries in the relationship. Provided strength-based interventions and help patient process her emotions around issues with daughter and not seeing grand kids. Identified relationships that can be supportive for patient and that they can help provide insight and validate how she feels. Provided positive feedback about the efforts of value patient places on therapeutic  relationship.    Plan: Return again in 1 weeks.2. Patient implement healthy relationship skills.3. Patient continued to learn coping skills skills to manage depression  Diagnosis: Axis I:  Post Traumatic Stress Disorder and Complicated grief, persistent depressive disorder    Axis II: No diagnosis    [name],[name], LCSW 02/22/2016

## 2016-02-24 ENCOUNTER — Other Ambulatory Visit: Payer: Self-pay

## 2016-02-24 MED ORDER — HYDROCODONE-ACETAMINOPHEN 7.5-325 MG PO TABS: 1.0000 | ORAL_TABLET | Freq: Three times a day (TID) | ORAL | 0 refills | Status: DC | PRN

## 2016-02-25 ENCOUNTER — Telehealth: Payer: Self-pay | Admitting: *Deleted

## 2016-02-25 NOTE — Telephone Encounter (Signed)
Faxed form to [place] [facility]

## 2016-02-29 ENCOUNTER — Encounter: Payer: Self-pay | Admitting: Family Medicine

## 2016-02-29 ENCOUNTER — Ambulatory Visit (INDEPENDENT_AMBULATORY_CARE_PROVIDER_SITE_OTHER): Payer: Medicare Other | Admitting: Family Medicine

## 2016-02-29 ENCOUNTER — Ambulatory Visit (INDEPENDENT_AMBULATORY_CARE_PROVIDER_SITE_OTHER): Payer: Medicare Other | Admitting: Licensed Clinical Social Worker

## 2016-02-29 VITALS — BP 128/80 | HR 80 | Wt 261.0 lb

## 2016-02-29 DIAGNOSIS — F4329 Adjustment disorder with other symptoms: Secondary | ICD-10-CM | POA: Diagnosis not present

## 2016-02-29 DIAGNOSIS — G894 Chronic pain syndrome: Secondary | ICD-10-CM

## 2016-02-29 DIAGNOSIS — F341 Dysthymic disorder: Secondary | ICD-10-CM | POA: Diagnosis not present

## 2016-02-29 DIAGNOSIS — F331 Major depressive disorder, recurrent, moderate: Secondary | ICD-10-CM

## 2016-02-29 DIAGNOSIS — Z23 Encounter for immunization: Secondary | ICD-10-CM

## 2016-02-29 DIAGNOSIS — M791 Myalgia: Secondary | ICD-10-CM

## 2016-02-29 DIAGNOSIS — F4310 Post-traumatic stress disorder, unspecified: Secondary | ICD-10-CM | POA: Diagnosis not present

## 2016-02-29 DIAGNOSIS — M7918 Myalgia, other site: Secondary | ICD-10-CM

## 2016-02-29 DIAGNOSIS — Z634 Disappearance and death of family member: Secondary | ICD-10-CM | POA: Diagnosis not present

## 2016-02-29 DIAGNOSIS — I10 Essential (primary) hypertension: Secondary | ICD-10-CM | POA: Diagnosis not present

## 2016-02-29 DIAGNOSIS — F4321 Adjustment disorder with depressed mood: Secondary | ICD-10-CM

## 2016-02-29 DIAGNOSIS — J301 Allergic rhinitis due to pollen: Secondary | ICD-10-CM

## 2016-02-29 MED ORDER — HYDROCODONE-ACETAMINOPHEN 7.5-325 MG PO TABS: 1.0000 | ORAL_TABLET | Freq: Three times a day (TID) | ORAL | 0 refills | Status: DC | PRN

## 2016-02-29 MED ORDER — CYCLOBENZAPRINE HCL 10 MG PO TABS: 10.0000 mg | ORAL_TABLET | Freq: Every evening | ORAL | 0 refills | Status: DC | PRN

## 2016-02-29 MED ORDER — METFORMIN HCL 500 MG PO TABS: 500.0000 mg | ORAL_TABLET | Freq: Two times a day (BID) | ORAL | 3 refills | Status: DC

## 2016-02-29 NOTE — Progress Notes (Signed)
Subjective:    CC:   HPI: Chronic pain management of her low back and bilateral knees. She is currently on hydrocodone 3 tabs daily. Her last urine drug screen was March, 5 mo ago. At one point was considering knee replacement. She is waiting to hear back about the financial end of it. She is also currently on gabapentin to help control her pain. And she also uses Flexeril as needed. She's also on Cymbalta. She feels like her stress levels are higher and she feels more depressed. She is living in a tent with her fiance and was kicked out of her "daughter's house".  She occ takes an extra dose of pain medications.  Smokes 1/2 ppd for 30 days.   Hypertension- Pt denies chest pain, SOB, dizziness, or heart palpitations.  Taking meds as directed w/o problems.  Denies medication side effects.    Depression - Being followed by psychiatry. She has been feeling more depressed.  She has an appt today.    Weight gain-the phentermine was not effective in the past we decided to start metformin.  She was told by "someone" told her not to take metformin.  She has lost 7 lbs in the last 2 months.    3 days of URI sxs.  Sneezing, runny nose, some sinus pressure. Worse on the left side.  No fever, chills or sweats.  Using a nightime cold medications.    Past medical history, Surgical history, Family history not pertinant except as noted below, Social history, Allergies, and medications have been entered into the medical record, reviewed, and corrections made.   Review of Systems: No fevers, chills, night sweats, weight loss, chest pain, or shortness of breath.   Objective:    General: Well Developed, well nourished, and in no acute distress.  Neuro: Alert and oriented x3, extra-ocular muscles intact, sensation grossly intact.  HEENT: Normocephalic, atraumatic, OP is clear, TMs and canals are clear, bilat. No cervical LN.    Skin: Warm and dry, no rashes. Cardiac: Regular rate and rhythm, no murmurs rubs or  gallops, no lower extremity edema.  Respiratory: Clear to auscultation bilaterally. Not using accessory muscles, speaking in full sentences.   Impression and Recommendations:   Chronic pain management-Filled her hydrocodone today and Flexeril.  Hypertension -Well controlled. Continue current regimen. Follow up in  6 months.   Depression - Is an appointment today with psychiatry for further management.  URI  With some component of AR - Recommend a trial of the over-the-counter antihistamine. She declines to try steroid spray. If not feeling some better by the end of the week and give [name] a call back for possible early sinus infection. Allergies

## 2016-02-29 NOTE — Progress Notes (Signed)
   THERAPIST PROGRESS NOTE  Session Time: 3 PM to 3:45 PM  Participation Level: Active  Behavioral Response: CasualAlertEuthymic  Type of Therapy: Individual Therapy  Treatment Goals addressed:  Coping, increase ability to tolerate PTSD symptoms, decrease in depressive symptoms  Interventions: CBT, Solution Focused, Strength-based, Supportive, Family Systems and Other: Grief processing, stress management, skills in effective interpersonal relationships  Summary: Ashley Gomez is a 45 y.o. female who showed therapist her engagement ring. Acknowledged that despite problems in her relationship that being engaged is validating and supportive. Discussed that at this point she needs to work on practicing patience. Going forward with relationship but still telling fiance her expectation that he can't be gone for periods of time and  can't gamble Lost seven pounds she says from stress. She relates she is a little more depressed. Explained this is related to living in a tent, and waiting for her money to come so that she can put a down payment and close on house. Recognizes that uncertainty and the unknown in the future can be a source of stress. Related that she is walking every day and being in nature's therapeutic. Updated therapist that daughter, [name], is asking for money even though she kicked her out of her house. Patient explained that she already has enough negative in her life so she doesn't want to be around her daughter cousins of her attitude. e and she already has a lot of negative already. Shared that she put flowers on her daughter's grave and talk with therapist about how rituals are helpful. Discussed with patient how she wants to talk about her grief is a healthy coping strategy and lack of communication in the family is not helpful in helping family members to process through grief.     Suicidal/Homicidal: No  Therapist Response: Reviewed current symptoms and identified sources of  increased depression. Provided support for patient in acknowledging that uncertainty and lack of stability in her life causes extra stress. Discussed with patient coping strategies that will be helpful. Explained that stress is cause from expecting things to be different than they are and patience and acceptance as helpful in managing stress. Described experience of not giving up as helpful coping strategy to get through difficult circumstances. Discussed how lack of communication is causing dysfunction in her family system. Encourage boundaries and not exposing herself to negative situations and expecting others to be respectful of her in interpersonal relationships. Provided positive feedback for patient engaging in rituals related to coping with grief as well as insights that will help her with grief process to work through and move forward.     Plan: Return again in 1 week.2. Patient gained insight in utilize effective coping strategies to help with stress that will also help with depression and anxiety.3. Patient continued to work through grief process with therapist.4. Patient continued to gain insight and apply effective interpersonal strategies  Diagnosis: Axis I:  Post Traumatic Stress Disorder and Complicated grief, persistent depressive disorder    Axis II: No diagnosis    Verbon Giangregorio A, LCSW 02/29/2016

## 2016-03-06 ENCOUNTER — Telehealth (HOSPITAL_COMMUNITY): Payer: Self-pay | Admitting: *Deleted

## 2016-03-06 NOTE — Telephone Encounter (Signed)
Called and informed pt Abilify prescription is ready for pickup.

## 2016-03-14 ENCOUNTER — Ambulatory Visit (INDEPENDENT_AMBULATORY_CARE_PROVIDER_SITE_OTHER): Payer: Medicare Other | Admitting: Licensed Clinical Social Worker

## 2016-03-14 DIAGNOSIS — F4310 Post-traumatic stress disorder, unspecified: Secondary | ICD-10-CM | POA: Diagnosis not present

## 2016-03-14 DIAGNOSIS — F4329 Adjustment disorder with other symptoms: Secondary | ICD-10-CM | POA: Diagnosis not present

## 2016-03-14 DIAGNOSIS — F4381 Prolonged grief disorder: Secondary | ICD-10-CM

## 2016-03-14 DIAGNOSIS — F4321 Adjustment disorder with depressed mood: Secondary | ICD-10-CM

## 2016-03-14 DIAGNOSIS — Z634 Disappearance and death of family member: Secondary | ICD-10-CM

## 2016-03-14 DIAGNOSIS — F341 Dysthymic disorder: Secondary | ICD-10-CM | POA: Diagnosis not present
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
## 2016-03-16 ENCOUNTER — Encounter (HOSPITAL_COMMUNITY): Payer: Self-pay | Admitting: Medical

## 2016-03-16 ENCOUNTER — Ambulatory Visit (INDEPENDENT_AMBULATORY_CARE_PROVIDER_SITE_OTHER): Payer: Medicare Other | Admitting: Medical

## 2016-03-16 VITALS — BP 124/80 | HR 99 | Ht 66.0 in | Wt 261.0 lb

## 2016-03-16 DIAGNOSIS — F4329 Adjustment disorder with other symptoms: Secondary | ICD-10-CM

## 2016-03-16 DIAGNOSIS — G894 Chronic pain syndrome: Secondary | ICD-10-CM | POA: Diagnosis not present

## 2016-03-16 DIAGNOSIS — F331 Major depressive disorder, recurrent, moderate: Secondary | ICD-10-CM

## 2016-03-16 DIAGNOSIS — F411 Generalized anxiety disorder: Secondary | ICD-10-CM

## 2016-03-16 DIAGNOSIS — Z634 Disappearance and death of family member: Secondary | ICD-10-CM

## 2016-03-16 DIAGNOSIS — F4321 Adjustment disorder with depressed mood: Secondary | ICD-10-CM

## 2016-03-16 DIAGNOSIS — F607 Dependent personality disorder: Secondary | ICD-10-CM

## 2016-03-16 DIAGNOSIS — Z79899 Other long term (current) drug therapy: Secondary | ICD-10-CM

## 2016-03-16 MED ORDER — CLONAZEPAM 1 MG PO TABS: 1.0000 mg | ORAL_TABLET | Freq: Three times a day (TID) | ORAL | 2 refills | Status: DC | PRN

## 2016-03-16 MED ORDER — GABAPENTIN 400 MG PO CAPS: ORAL_CAPSULE | ORAL | 2 refills | Status: DC

## 2016-03-16 MED ORDER — DULOXETINE HCL 60 MG PO CPEP: 60.0000 mg | ORAL_CAPSULE | Freq: Every day | ORAL | 3 refills | Status: DC

## 2016-03-16 MED ORDER — ARIPIPRAZOLE 10 MG PO TABS: 10.0000 mg | ORAL_TABLET | Freq: Every day | ORAL | 2 refills | Status: DC

## 2016-03-16 MED ORDER — TOPIRAMATE 100 MG PO TABS: ORAL_TABLET | ORAL | 2 refills | Status: DC
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
## 2016-03-24 NOTE — Telephone Encounter (Signed)
Doing f/u; called [place] [facility] however office is closed for the holiday

## 2016-03-27 ENCOUNTER — Other Ambulatory Visit: Payer: Self-pay

## 2016-03-27 MED ORDER — HYDROCODONE-ACETAMINOPHEN 7.5-325 MG PO TABS: 1.0000 | ORAL_TABLET | Freq: Three times a day (TID) | ORAL | 0 refills | Status: DC | PRN

## 2016-03-28 ENCOUNTER — Ambulatory Visit (INDEPENDENT_AMBULATORY_CARE_PROVIDER_SITE_OTHER): Payer: Medicare Other | Admitting: Licensed Clinical Social Worker

## 2016-03-28 DIAGNOSIS — F4329 Adjustment disorder with other symptoms: Secondary | ICD-10-CM

## 2016-03-28 DIAGNOSIS — F341 Dysthymic disorder: Secondary | ICD-10-CM

## 2016-03-28 DIAGNOSIS — F4321 Adjustment disorder with depressed mood: Secondary | ICD-10-CM

## 2016-03-28 DIAGNOSIS — F4310 Post-traumatic stress disorder, unspecified: Secondary | ICD-10-CM | POA: Diagnosis not present

## 2016-03-28 DIAGNOSIS — Z634 Disappearance and death of family member: Secondary | ICD-10-CM | POA: Diagnosis not present
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
## 2016-04-11 ENCOUNTER — Ambulatory Visit (HOSPITAL_COMMUNITY): Payer: Self-pay | Admitting: Licensed Clinical Social Worker

## 2016-04-18 ENCOUNTER — Ambulatory Visit (INDEPENDENT_AMBULATORY_CARE_PROVIDER_SITE_OTHER): Payer: Medicare Other | Admitting: Licensed Clinical Social Worker

## 2016-04-18 DIAGNOSIS — F341 Dysthymic disorder: Secondary | ICD-10-CM

## 2016-04-18 DIAGNOSIS — F4310 Post-traumatic stress disorder, unspecified: Secondary | ICD-10-CM | POA: Diagnosis not present

## 2016-04-18 NOTE — Progress Notes (Signed)
   THERAPIST PROGRESS NOTE  Session Time: 2 PM to 2:55 PM  Participation Level: Active  Behavioral Response: CasualAlertEuthymic  Type of Therapy: Individual Therapy  Treatment Goals addressed:  Coping, increase ability to tolerate PTSD symptoms, decrease in depressive symptoms  Interventions: CBT, Solution Focused, Strength-based, Supportive, Reframing and Other: grief focused interventions  Summary: Ashley Gomez is a 45 y.o. female who presents with sinus and headaches related to stress. She is still dealing with ongoing stressors. She still hasn't house but wants to be optimistic and tries to be positive every day. She is trying her best to work through fear of the unknown. Her realtor gave her something that says patience and therapist pointed out this is ineffective's coping strategy in dealing with life stressors. Related that her ration ship with fianc is not all that although still spends a lot of time with friends.he does spend time with her but usually sleeping and relates that he is depressed like her because they still have not closed on the house. Reviewed issues around grief and discussed that grief support groups were helpful in some ways but it did not help that they did not look at her daughter's unborn baby as a loss. She said it depends on the day how she feels and whether she is angry because she doesn't understand why she lost her daughter. Her other daughter, Ashley Gomez, has turned to patient when needing help although still have not mended relationship. Reviewed some of life lessons that patient has learned and she related that he can't change the past and therapist pointed out to her thought to let her lose awareness of the impact she has in her life currently. Patient shared that one thing she would look forward to is having Christmas and new house  Suicidal/Homicidal: No  Therapist Response: Reviewed patient's progress and symptoms. Reviewed effective coping strategies to  manage stressors including focusing on the positive and remaining optimistic, and challenging cognitive distortion of predicting the future. Work with patient on finding rational challenges to negative thinking to help cope with stressors. Explored with patient some of the wisdom she has learned to help with coping and identified some as learning not to give up on things and learning from experiences including suffering. Pointed out she has an impact on life currently even though when can't go back and change the past. Discussed grief issues and therapist guided patient an understanding grief work in therapy is finding ways not to be self-destructive and  channeling energy into something constructive and purposeful to help her to move forward. Therapist this pointed out that she is working with patient to work on grief through constructive and not destructive strategies. Provided strength based and supportive interventions.   Plan: Return again in 1 weeks.2.patient continued to gain insight and implement effective coping strategies for grief.3. Patient gain insight and apply effective coping strategies to manage stressors  Diagnosis: Axis I:  Post Traumatic Stress Disorder and Complicated grief, persistent depressive disorder    Axis II: No diagnosis    [name],[name], LCSW 04/18/2016

## 2016-04-24 ENCOUNTER — Telehealth (HOSPITAL_COMMUNITY): Payer: Self-pay | Admitting: Medical

## 2016-04-24 NOTE — Telephone Encounter (Signed)
Pt has rx for klonopin. Pt states she can not get it filled until 12/28.  She last got it filled on 11/6.  She needs [name] to call the pharmacy and get this approved to go ahead and fill rx.   Please advise.   Pharmacy is [facility] [address]

## 2016-04-24 NOTE — Telephone Encounter (Signed)
Spoke to pharmacy. They read the rx wrong.  Everything is taken care of.   Nothing further needed at this time.

## 2016-04-25 ENCOUNTER — Ambulatory Visit (INDEPENDENT_AMBULATORY_CARE_PROVIDER_SITE_OTHER): Payer: Medicare Other | Admitting: Licensed Clinical Social Worker

## 2016-04-25 DIAGNOSIS — F4310 Post-traumatic stress disorder, unspecified: Secondary | ICD-10-CM

## 2016-04-25 DIAGNOSIS — F341 Dysthymic disorder: Secondary | ICD-10-CM

## 2016-04-25 DIAGNOSIS — F4329 Adjustment disorder with other symptoms: Secondary | ICD-10-CM | POA: Diagnosis not present

## 2016-04-25 DIAGNOSIS — Z634 Disappearance and death of family member: Secondary | ICD-10-CM

## 2016-04-25 DIAGNOSIS — F4321 Adjustment disorder with depressed mood: Secondary | ICD-10-CM

## 2016-04-25 NOTE — Progress Notes (Signed)
   THERAPIST PROGRESS NOTE  Session Time: 4 PM to 4:55 PM  Participation Level: Active  Behavioral Response: CasualAlertEuthymic  Type of Therapy: Individual Therapy  Treatment Goals addressed:  Coping, increase ability to tolerate PTSD symptoms, decrease in depressive symptoms  Interventions: CBT, Solution Focused, Strength-based, Supportive and Reframing  Summary: Ashley Gomez is a 45 y.o. female who presents with still waiting on a home. She describes feeling more depressed and holidays add to it. She related that she should not be 45 and in a tent. She can't have knee surgery until in the house. She is not sleeping. She has had a headache for the past week and doesn't have a lot to do in day. She is hoping things get better soon, and only has a couple more weeks to wait until the end of the month which is the deadline for getting her disability. Therapist encouraged patient to think about what she was grateful for and helping with negative emotions and she said her kids and grand kids. She gets to see them a couple times a week and they are doing well. Discussed with patient the important role she will play in their lives and the opportunity to be there to share and experiences in their life. Discussed the ups and downs in life and inevitably there will be positive experiences for her. Shared that relationship with daughter has gotten better and thinks she will be at her house for Christmas. Relates that she still worries about her kids and grand kids and their safety. Therapist brought up recognizing limits of control we have in our life.   Suicidal/Homicidal: No  Therapist Response: Rviewed patient's progress and symptoms. Provided supportive and strength-based interventions related to patient's ongoing stressors and depressive symptoms. Discussed gratitude and distraction as coping strategies in managing stressors and strategies. Applied CBT strategies to help challenge accuracy of  negative thoughts and replace something positive. Utilize reframing and helped in perspective taking in managing anxiety by helping patient gain insight  Ineffectiveness of holding on to control of things outside ourselves. Worked on building insight to purpose and meaning patient has an role as mother and granddaughter to help with depressive symptoms.worked with patient on recognizing resilience as an effective coping strategy.  Plan: Return again in 2 weeks.2.patient continued to gain insight to effective strategies to manage stressors in depressive symptoms and implement into life situations  Diagnosis: Axis I:  Post Traumatic Stress Disorder and Complicated grief, persistent depressive disorder    Axis II: No diagnosis    [name],[name], LCSW 04/25/2016

## 2016-04-26 ENCOUNTER — Other Ambulatory Visit: Payer: Self-pay

## 2016-04-26 MED ORDER — HYDROCODONE-ACETAMINOPHEN 7.5-325 MG PO TABS: 1.0000 | ORAL_TABLET | Freq: Three times a day (TID) | ORAL | 0 refills | Status: DC | PRN

## 2016-05-16 ENCOUNTER — Ambulatory Visit (HOSPITAL_COMMUNITY): Payer: Self-pay | Admitting: Licensed Clinical Social Worker

## 2016-05-23 ENCOUNTER — Ambulatory Visit (INDEPENDENT_AMBULATORY_CARE_PROVIDER_SITE_OTHER): Payer: Medicare Other | Admitting: Licensed Clinical Social Worker

## 2016-05-23 DIAGNOSIS — F4310 Post-traumatic stress disorder, unspecified: Secondary | ICD-10-CM

## 2016-05-23 DIAGNOSIS — F341 Dysthymic disorder: Secondary | ICD-10-CM

## 2016-05-23 NOTE — Progress Notes (Signed)
   THERAPIST PROGRESS NOTE  Session Time: 2 PM to 2:55 PM  Participation Level: Active  Behavioral Response: CasualAlertDysphoric, Euthymic and Tearful at times in session  Type of Therapy: Individual Therapy  Treatment Goals addressed:  decrease in depression, anxiety, stress management and work through grief issues  Interventions: CBT, Solution Focused, Strength-based, Supportive, Family Systems, Reframing and Other: Grief processing, problem solving skills  Summary: Ashley Gomez is a 46 y.o. female who presents ongoing severe stressors including not receiving Social Security and living in garage. She feels she would probably better cope if stressors were lessened. She has taken things in her hands and has contacted [employer] to deal with issue. Her realtor has given her a saying and encourages patience. She has thought to herself and she doesn't understand why she is in this position after all the sacrifices she has made raising 3 children on her own, losing her daughter and grandchild and that she doesn't deserve her current situation. Discussed ongoing conflict with her daughter autumn and is come to have more insight that she is probably angry because she resents responsibility of taking care of her sister's children even though she had asked for the responsibility. Shared that her daughter says everything is her fault.develops new treatment plan an and ongoing stressors contributed to severity of mood symptoms. Recent will work on decreasing depression, anxiety, stress management and working through grief issues. Patient says she thinks she is doing better with grief issues but also said she is better at hiding her feelings. Grief will come out when she is alone and unexpectedly and described difficulty of talking to sister who has new grandson. She is very happy for her sister but it brings up the loss of her own grandson.   Suicidal/Homicidal: No  Therapist Response: Encouraged patient  to refrain with perspectives that will help her cope better by being less destructive and and more constructive and by getting away from questions that ask why but rather thinking more of how to get into action mode and solution focused. Encouraged patient and coping strategy of patient's and challenging our own ability to think we have all the answers to encourage cognitive strategies that helps with hopefulness. Help patient process feelings around grief and relationship with her daughter and provided positive feedback about going insight as to the source of daughters anger as well as defense mechanism of projection. Validated patient on her feelings and completed new treatment plan the patient will focus on decreasing depression and anxiety. Provided positive feedback for patient taking problem solving steps. Provided strength based and supportive interventions.  Plan: Return again in 1 weeks.2.patient continue to gain insight to effective coping strategies for depression, grief, anxiety and implement in life situations 3.therapist applied supportive interventions   Diagnosis: Axis I:  Post Traumatic Stress Disorder and Complicated grief, persistent depressive disorder    Axis II: No diagnosis    Maricsa Sammons A, LCSW 05/23/2016

## 2016-05-30 ENCOUNTER — Ambulatory Visit (INDEPENDENT_AMBULATORY_CARE_PROVIDER_SITE_OTHER): Payer: Medicare Other

## 2016-05-30 ENCOUNTER — Ambulatory Visit (INDEPENDENT_AMBULATORY_CARE_PROVIDER_SITE_OTHER): Payer: Medicare Other | Admitting: Family Medicine

## 2016-05-30 ENCOUNTER — Ambulatory Visit (INDEPENDENT_AMBULATORY_CARE_PROVIDER_SITE_OTHER): Payer: Medicare Other | Admitting: Licensed Clinical Social Worker

## 2016-05-30 ENCOUNTER — Encounter: Payer: Self-pay | Admitting: Family Medicine

## 2016-05-30 VITALS — BP 117/71 | HR 87 | Ht 66.0 in | Wt 258.0 lb

## 2016-05-30 DIAGNOSIS — F4329 Adjustment disorder with other symptoms: Secondary | ICD-10-CM | POA: Diagnosis not present

## 2016-05-30 DIAGNOSIS — M791 Myalgia: Secondary | ICD-10-CM

## 2016-05-30 DIAGNOSIS — F4321 Adjustment disorder with depressed mood: Secondary | ICD-10-CM

## 2016-05-30 DIAGNOSIS — F341 Dysthymic disorder: Secondary | ICD-10-CM

## 2016-05-30 DIAGNOSIS — Z634 Disappearance and death of family member: Secondary | ICD-10-CM | POA: Diagnosis not present

## 2016-05-30 DIAGNOSIS — R938 Abnormal findings on diagnostic imaging of other specified body structures: Secondary | ICD-10-CM

## 2016-05-30 DIAGNOSIS — Z6841 Body Mass Index (BMI) 40.0 and over, adult: Secondary | ICD-10-CM

## 2016-05-30 DIAGNOSIS — G8929 Other chronic pain: Secondary | ICD-10-CM | POA: Diagnosis not present

## 2016-05-30 DIAGNOSIS — M170 Bilateral primary osteoarthritis of knee: Secondary | ICD-10-CM

## 2016-05-30 DIAGNOSIS — R9389 Abnormal findings on diagnostic imaging of other specified body structures: Secondary | ICD-10-CM

## 2016-05-30 DIAGNOSIS — M7918 Myalgia, other site: Secondary | ICD-10-CM

## 2016-05-30 DIAGNOSIS — F4310 Post-traumatic stress disorder, unspecified: Secondary | ICD-10-CM | POA: Diagnosis not present

## 2016-05-30 MED ORDER — HYDROCODONE-ACETAMINOPHEN 7.5-325 MG PO TABS: 1.0000 | ORAL_TABLET | Freq: Three times a day (TID) | ORAL | 0 refills | Status: DC | PRN

## 2016-05-30 MED ORDER — CYCLOBENZAPRINE HCL 10 MG PO TABS: 10.0000 mg | ORAL_TABLET | Freq: Every evening | ORAL | 0 refills | Status: DC | PRN

## 2016-05-30 MED ORDER — OMEPRAZOLE 40 MG PO CPDR: 40.0000 mg | DELAYED_RELEASE_CAPSULE | Freq: Every day | ORAL | 6 refills | Status: DC

## 2016-05-30 MED ORDER — METFORMIN HCL 500 MG PO TABS: 500.0000 mg | ORAL_TABLET | Freq: Two times a day (BID) | ORAL | 3 refills | Status: DC
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
## 2016-05-30 NOTE — Progress Notes (Signed)
Subjective:    CC: Follow-up chronic pain management  HPI:  Her today for chronic pain management in regards to her chronic low back pain and bilateral knee pain. She's currently on hydrocodone 3 tabs daily. Urine drug screen performed today. At one point she had discussed with the orthopedist having a knee replacement but she wanted to wait because of finances. She is also currently on gabapentin, Flexeril and Cymbalta.  She and her boyfriend are no longer together as of yesterday so she is again living with her daughter which is not the most healthy situation for her but she is planning on getting her own place to stay seen.  She has been postmenopausal with amenorrhea from his 3 years and about a week ago she noticed she was seeing some bright red blood after wiping after urination. She did have a thickening of the endometrial lining that was noted back over the summer on an ultrasound which was never followed up on. At the time but she was unable to go.  PTSD-she's been doing therapy/counseling weekly and this has been helpful.  Obesity-we decided to put her on metformin to see if this would help. She is actually lost 3 more pounds and is doing well without any side effects. She says most days she only eats once a day. She's not actively exercising.  Past medical history, Surgical history, Family history not pertinant except as noted  below, Social history, Allergies, and medications have been entered into the medical record, reviewed, and corrections made.   Review of Systems: No fevers, chills, night sweats, weight loss, chest pain, or shortness of breath.   Objective:    General: Well Developed, well nourished, and in no acute distress.  Neuro: Alert and oriented x3, extra-ocular muscles intact, sensation grossly intact.  HEENT: Normocephalic, atraumatic  Skin: Warm and dry, no rashes. Cardiac: Regular rate and rhythm, no murmurs rubs or gallops, no lower extremity edema.   Respiratory: Clear to auscultation bilaterally. Not using accessory muscles, speaking in full sentences.   Impression and Recommendations:    Chronic pain management-reviewed the [facility] as database and received a prescription seems to be appropriate. We did refill her medication today as well as perform a urine drug screen. Follow-up in 3 months.  Has menopausal amenorrhea-due for repeat ultrasound to reexamine the thickening of the endometrium. If it is still thickened then will need endometrial biopsy for further workup.  Endometrial thickening seen on ultrasound-see note above.   Obesity/BMI 41-once again discussed eating more regularly and getting more protein in her diet. Once again we discussed the eating once a day is not healthy and actually slows metabolism down.

## 2016-05-31 ENCOUNTER — Other Ambulatory Visit: Payer: Self-pay | Admitting: Family Medicine

## 2016-05-31 DIAGNOSIS — N950 Postmenopausal bleeding: Secondary | ICD-10-CM

## 2016-05-31 DIAGNOSIS — R9389 Abnormal findings on diagnostic imaging of other specified body structures: Secondary | ICD-10-CM

## 2016-06-02 LAB — DRUG ABUSE PANEL 10-50 NO CONF, U
AMPHETAMINES (1000 ng/mL SCRN): NEGATIVE
BARBITURATES: NEGATIVE
BENZODIAZEPINES: NEGATIVE
COCAINE METABOLITES: NEGATIVE
MARIJUANA MET (50 ng/mL SCRN): NEGATIVE
METHADONE: NEGATIVE
METHAQUALONE: NEGATIVE
OPIATES: POSITIVE — AB
PHENCYCLIDINE: NEGATIVE
PROPOXYPHENE: NEGATIVE

## 2016-06-06 ENCOUNTER — Ambulatory Visit (INDEPENDENT_AMBULATORY_CARE_PROVIDER_SITE_OTHER): Payer: Medicare Other | Admitting: Licensed Clinical Social Worker

## 2016-06-06 DIAGNOSIS — F341 Dysthymic disorder: Secondary | ICD-10-CM

## 2016-06-06 DIAGNOSIS — Z634 Disappearance and death of family member: Secondary | ICD-10-CM | POA: Diagnosis not present

## 2016-06-06 DIAGNOSIS — F4381 Prolonged grief disorder: Secondary | ICD-10-CM

## 2016-06-06 DIAGNOSIS — F4329 Adjustment disorder with other symptoms: Secondary | ICD-10-CM

## 2016-06-06 DIAGNOSIS — F4310 Post-traumatic stress disorder, unspecified: Secondary | ICD-10-CM | POA: Diagnosis not present

## 2016-06-06 DIAGNOSIS — F4321 Adjustment disorder with depressed mood: Secondary | ICD-10-CM
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
## 2016-06-12 ENCOUNTER — Ambulatory Visit (INDEPENDENT_AMBULATORY_CARE_PROVIDER_SITE_OTHER): Payer: Medicare Other | Admitting: Licensed Clinical Social Worker

## 2016-06-12 DIAGNOSIS — Z634 Disappearance and death of family member: Secondary | ICD-10-CM | POA: Diagnosis not present

## 2016-06-12 DIAGNOSIS — F341 Dysthymic disorder: Secondary | ICD-10-CM | POA: Diagnosis not present

## 2016-06-12 DIAGNOSIS — F4310 Post-traumatic stress disorder, unspecified: Secondary | ICD-10-CM | POA: Diagnosis not present

## 2016-06-12 DIAGNOSIS — F4329 Adjustment disorder with other symptoms: Secondary | ICD-10-CM | POA: Diagnosis not present

## 2016-06-12 DIAGNOSIS — F4321 Adjustment disorder with depressed mood: Secondary | ICD-10-CM

## 2016-06-12 NOTE — Progress Notes (Signed)
   THERAPIST PROGRESS NOTE  Session Time: 3 PM to 3:50 PM  Participation Level: Active  Behavioral Response: CasualAlertEuthymic and Tearful in discussing loss  Type of Therapy: Individual Therapy  Treatment Goals addressed:  decrease in depression, anxiety, stress management and work through grief issues  Interventions: Solution Focused, Strength-based, Supportive and Other: Grief processing, skills and healthy interpersonal relationships  Summary: Ashley Gomez is a 45 y.o. female who present with update of status of her relationship. He is  apolgizing and begging her to come back. She said she is standing her ground so that this time he no she is serious. She verbalized that he has a lot of changes to make. She does have moments of thinking what he is up to. She is not sure how much time she is going to give him but thinks this Friday when he gets his check will be's indicator of his willingness to change. She is are not autumn's hooch still does not talk to her but relates she is "not going to stress too much over it". Therapist pointed out he needs to show her that he is worthy of her. She feels good about taking a stand. She is become more depressed about [name] now being at [name]'s. Discussed what helps and she related when she is distracted but has more time to think about things. She became tearful and acknowledged that she struggles with acceptance. She relates that it is hard that she is not here and see her children growing up. Reviewed session and patient said she wants to start writing, related that "things can get better" and count blessings  Suicidal/Homicidal: No  Therapist Response: Helped patient to process emotions surrounding her relationship and conflict in her relationship and encouraged patient in using ealthy relationship skills. Provided patient positive feedback about being assertive and having her partner show through the way he treats her that he recognizes her  value. Provided grief focused interventions including acceptance of distress helps one to work through it, channeling her emotions in ways where she can find meaning through her role as a grandmother and in keeping memory of her daughter alive, how resilience is something that can be strengthened, and cope by counting one's blessings. Utilized interventions to help in building hope for patient in her life, encourage patient in coping strategy of writing and used strength based and supportive interventions  Plan: Return again in 1 week.2. Therapist continue continue to work with patient on grief processing and insight to healthy interpersonal relationships  Diagnosis: Axis I:  Post Traumatic Stress Disorder and Complicated grief, persistent depressive disorder    Axis II: No diagnosis    [name],[name], LCSW 06/12/2016

## 2016-06-19 ENCOUNTER — Ambulatory Visit (INDEPENDENT_AMBULATORY_CARE_PROVIDER_SITE_OTHER): Payer: Medicare Other | Admitting: Licensed Clinical Social Worker

## 2016-06-19 DIAGNOSIS — F341 Dysthymic disorder: Secondary | ICD-10-CM

## 2016-06-19 DIAGNOSIS — F4329 Adjustment disorder with other symptoms: Secondary | ICD-10-CM

## 2016-06-19 DIAGNOSIS — F4310 Post-traumatic stress disorder, unspecified: Secondary | ICD-10-CM | POA: Diagnosis not present

## 2016-06-19 DIAGNOSIS — Z634 Disappearance and death of family member: Secondary | ICD-10-CM | POA: Diagnosis not present

## 2016-06-19 DIAGNOSIS — F4321 Adjustment disorder with depressed mood: Secondary | ICD-10-CM

## 2016-06-19 NOTE — Progress Notes (Signed)
   THERAPIST PROGRESS NOTE  Session Time: 1 PM to 1:50 PM  Participation Level: Active  Behavioral Response: CasualAlertEuthymic  Type of Therapy: Individual Therapy  Treatment Goals addressed:  decrease in depression, anxiety, stress management and work through grief issues  Interventions: Solution Focused, Strength-based, Supportive and Other: Grief processing, skills and healthy interpersonal relationships  Summary: Ashley Gomez is a 46 y.o. female who presents with now staying in a motel and back with boyfriend. He was more responsible with his money from last paycheck and she expects him to stay on track with the plan to help her out with bills and be more responsible if they are moving into a house together. Discussed stressors of needing to move quickly from motel to finding a place for financial reasons. She is writing again, she is crying when she writes but realizes that it's helping. She carries her journal around with her, writes things that are helpful and identified discussion with therapist last week of separating when option A is no longer feasible than making the most out of option B. In other words doing the best with what she has been given. She is less depressed now that out of [name]'s house. She was by herself all day, had too much time to think about [name] and companionship of boyfriend helps. He feels when she moves into her own place symptoms will improve. Discussed history of her pets that it was suggested for her after loss of [name], she has had the mom is 4 years and she feels has helped a lot with depression and PTSD. Asked therapist for letter for emotional support animal. Discussed with therapist helpfulness of humor that happens in therapy session  Suicidal/Homicidal: No  Therapist Response: Reviewed progress and patient symptoms and continue to encourage patient in implementing healthy relationship skills including being assertive and communicating to make  sure her needs are being met. Help patient process feelings around stressors to help in management of her emotions and help patient engage in problem solving around her stressors. Identified that being in a supportive environment is helpful for patient. Provided positive feedback for the patient's use of writing as a coping strategy that has helping her. Related that writing helps in releasing emotions and since patient carries it around with her it will help give her insight to triggers as well as what she learns is effective coping. Reinforced patient insight to help with grieving process of recognition of acceptance of doing the best with what she has been given. Discussed emotional support animal and all and how that has been very helpful for her since the loss of her daughter with depression and PTSD. Provided supportive and strength-based interventions  Plan: Return again in 1 weeks.2. Therapist write letter for emotional support animal.3. Therapist continued to work with patient on emotional regulation strategies to help an improvement in mood and insight to healthy interpersonal relationships  Diagnosis: Axis I:  Post Traumatic Stress Disorder and Complicated grief, persistent depressive disorder    Axis II: No diagnosis    Makita Blow A, LCSW 06/19/2016

## 2016-06-20 ENCOUNTER — Encounter: Payer: Self-pay | Admitting: Obstetrics & Gynecology

## 2016-06-23 ENCOUNTER — Encounter (HOSPITAL_COMMUNITY): Payer: Self-pay | Admitting: Licensed Clinical Social Worker

## 2016-06-26 ENCOUNTER — Other Ambulatory Visit: Payer: Self-pay

## 2016-06-26 MED ORDER — HYDROCODONE-ACETAMINOPHEN 7.5-325 MG PO TABS: 1.0000 | ORAL_TABLET | Freq: Three times a day (TID) | ORAL | 0 refills | Status: DC | PRN

## 2016-06-27 ENCOUNTER — Ambulatory Visit (INDEPENDENT_AMBULATORY_CARE_PROVIDER_SITE_OTHER): Payer: Medicare Other | Admitting: Licensed Clinical Social Worker

## 2016-06-27 DIAGNOSIS — F341 Dysthymic disorder: Secondary | ICD-10-CM | POA: Diagnosis not present

## 2016-06-27 DIAGNOSIS — F4321 Adjustment disorder with depressed mood: Secondary | ICD-10-CM

## 2016-06-27 DIAGNOSIS — F4329 Adjustment disorder with other symptoms: Secondary | ICD-10-CM

## 2016-06-27 DIAGNOSIS — F4310 Post-traumatic stress disorder, unspecified: Secondary | ICD-10-CM

## 2016-06-27 DIAGNOSIS — Z634 Disappearance and death of family member: Secondary | ICD-10-CM | POA: Diagnosis not present

## 2016-06-27 NOTE — Progress Notes (Signed)
   THERAPIST PROGRESS NOTE  Session Time: 1:00 PM-1:50 PM  Participation Level: Active  Behavioral Response: CasualAlertEuthymic, patient also seemed tired  Type of Therapy: Individual Therapy  Treatment Goals addressed:  decrease in depression, anxiety, stress management and work through grief issues  Interventions: Solution Focused, Strength-based, Supportive, Reframing and Other: Skills to build self-esteem, grief processing, healthy skills for interpersonal relationships  Summary: Ashley Gomez is a 46 y.o. female who presents with recent loss of uncle. She had much contact since they had an argument but she cried think morning thinking about. Shared he was very involved in her life when she was younger. Shared her insight that there has to be has to be grieving process when there is B. Discussed loss of daughter and related that Ashley Gomez is always going to be her baby girl and doesn't know if she will ever get over it. Shared that it has gotten a little better because it doesn't hurt all the time. It hits when she least expect it. She is going to get Option B from [employer] when it comes and and also she said reading other grief literature has been helpful to her. Therapist encouraged her to look at movie "The [place]". Updated therapist that she is back at [name] because motel is expensive. Found a house, and boyfriend said he doesn't want to go. This morning he said that he is mad at the world, and patient did not let him go on. Reviewed that he has to grow up, she loves him with all her heart but also expressed being tired and fed up. If he he doesn't want to be an adult time then to just go. She cares what happens to him, loves him dearly but if she has to be by herself than she will be. She wanted him to change just to be in a good graces. Shared that her money is gone and the blame was pointed at patient. Discussed expectations patient can have for boyfriend indicate he has changed.  Discussed some of the positive changes that recently happened in patient's life such as now having an income and looking to buy a house that help her see things more positively.   Suicidal/Homicidal: No  Therapist Response: Reviewed progress and symptoms and work with patient on processing feelings of grief related to recent loss. Help patient process feelings of grief that were triggered that related to her daughter. Help patient to process feelings around her relationship in order for her to make healthy decisions about her relationship. Helped patient process what she wants from her relationship in order to help her better understand what she expects and also in terms of helping her in her decision making. Help patient to recognize her strengths and recent positive developments in her life to help her in gaining more positive perspective about her life and herself. Divided strength-based to help also in her relationship and see the value that she brings to relationship and helping to strengthen her sense of self. Provided supportive interventions. Plan: Return again in 1 weeks.2.Patient will review "[employer]" and given emotional support animal.3. Patient learn strategies to help with mood and insight to healthy interpersonal relationships.  Diagnosis: Axis I:  Post Traumatic Stress Disorder and Complicated grief, persistent depressive disorder    Axis II: No diagnosis    Shaliyah Taite A, LCSW 06/27/2016

## 2016-07-03 ENCOUNTER — Ambulatory Visit (INDEPENDENT_AMBULATORY_CARE_PROVIDER_SITE_OTHER): Payer: Medicare Other | Admitting: Licensed Clinical Social Worker

## 2016-07-03 DIAGNOSIS — F341 Dysthymic disorder: Secondary | ICD-10-CM

## 2016-07-03 DIAGNOSIS — Z634 Disappearance and death of family member: Secondary | ICD-10-CM | POA: Diagnosis not present

## 2016-07-03 DIAGNOSIS — F4310 Post-traumatic stress disorder, unspecified: Secondary | ICD-10-CM

## 2016-07-03 DIAGNOSIS — F4329 Adjustment disorder with other symptoms: Secondary | ICD-10-CM | POA: Diagnosis not present

## 2016-07-03 DIAGNOSIS — F4321 Adjustment disorder with depressed mood: Secondary | ICD-10-CM
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
## 2016-07-06 ENCOUNTER — Ambulatory Visit (INDEPENDENT_AMBULATORY_CARE_PROVIDER_SITE_OTHER): Payer: Medicare Other | Admitting: Obstetrics & Gynecology

## 2016-07-06 ENCOUNTER — Encounter: Payer: Self-pay | Admitting: Sports Medicine

## 2016-07-06 ENCOUNTER — Ambulatory Visit (INDEPENDENT_AMBULATORY_CARE_PROVIDER_SITE_OTHER): Payer: Medicare Other

## 2016-07-06 ENCOUNTER — Encounter: Payer: Self-pay | Admitting: Obstetrics & Gynecology

## 2016-07-06 ENCOUNTER — Encounter: Payer: Self-pay | Admitting: *Deleted

## 2016-07-06 ENCOUNTER — Ambulatory Visit (INDEPENDENT_AMBULATORY_CARE_PROVIDER_SITE_OTHER): Payer: Medicare Other | Admitting: Sports Medicine

## 2016-07-06 ENCOUNTER — Other Ambulatory Visit (HOSPITAL_COMMUNITY)
Admission: RE | Admit: 2016-07-06 | Discharge: 2016-07-06 | Disposition: A | Discharge status: Home / Self Care | Payer: Medicare Other | Source: Ambulatory Visit | Attending: Obstetrics & Gynecology | Admitting: Obstetrics & Gynecology

## 2016-07-06 VITALS — BP 132/90 | HR 96 | Ht 67.0 in | Wt 264.0 lb

## 2016-07-06 DIAGNOSIS — N950 Postmenopausal bleeding: Secondary | ICD-10-CM | POA: Insufficient documentation

## 2016-07-06 DIAGNOSIS — Z3202 Encounter for pregnancy test, result negative: Secondary | ICD-10-CM | POA: Diagnosis not present

## 2016-07-06 DIAGNOSIS — Z01812 Encounter for preprocedural laboratory examination: Secondary | ICD-10-CM | POA: Diagnosis not present

## 2016-07-06 DIAGNOSIS — N83209 Unspecified ovarian cyst, unspecified side: Secondary | ICD-10-CM | POA: Diagnosis not present

## 2016-07-06 DIAGNOSIS — M170 Bilateral primary osteoarthritis of knee: Secondary | ICD-10-CM | POA: Diagnosis not present

## 2016-07-06 DIAGNOSIS — G8929 Other chronic pain: Secondary | ICD-10-CM

## 2016-07-06 DIAGNOSIS — M25512 Pain in left shoulder: Secondary | ICD-10-CM

## 2016-07-06 DIAGNOSIS — M75102 Unspecified rotator cuff tear or rupture of left shoulder, not specified as traumatic: Secondary | ICD-10-CM | POA: Insufficient documentation

## 2016-07-06 LAB — POCT URINE PREGNANCY: Preg Test, Ur: NEGATIVE

## 2016-07-06 MED ORDER — CELECOXIB 200 MG PO CAPS: ORAL_CAPSULE | ORAL | 2 refills | Status: DC

## 2016-07-06 NOTE — Progress Notes (Signed)
   Subjective:    I'm seeing this patient as a consultation for:  Dr. Madilyn Fireman  CC: Shoulder pain  HPI:  This is a 46 year old female who presents for bilateral shoulder pain that is been progressively worsening for 3 months. She states she is unable to raise her arm above shoulder level and unable to hook her bra with her left arm, but right is also beginning to hurt her. Endorses left sided shoulder pain, and numbness that originates in her elbow and goes into her fingers. She regularly takes flexeril, hydrocodone, and gabapentin but has not noticed a difference.     Past medical history:  Negative.  See flowsheet/record as well for more information.  Surgical history: Negative.  See flowsheet/record as well for more information.  Family history: Negative.  See flowsheet/record as well for more information.  Social history: Negative.  See flowsheet/record as well for more information.  Allergies, and medications have been entered into the medical record, reviewed, and no changes needed.   Review of Systems: No headache, visual changes, nausea, vomiting, diarrhea, constipation, dizziness, abdominal pain, skin rash, fevers, chills, night sweats, weight loss, swollen lymph nodes, body aches, joint swelling, muscle aches, chest pain, shortness of breath, mood changes, visual or auditory hallucinations.   Objective:   General: Well Developed, well nourished, and in no acute distress.  Neuro/Psych: Alert and oriented x3, extra-ocular muscles intact, able to move all 4 extremities, sensation grossly intact. Skin: Warm and dry, no rashes noted.  Respiratory: Not using accessory muscles, speaking in full sentences, trachea midline.  Cardiovascular: Pulses palpable, no extremity edema. Abdomen: Does not appear distended. Musculoskeletal:  -Left shoulder: ROM limited on flexion, extension, abduction. Pain with hawkin's, empty can test, cross arm maneuver. No pain with resisted supination of  forearm, neer's, crank arm.  - Neck: full aROM. Negative compression test.  - Left elbow: full aROM. Tinel's sign positive over cubital tunnel.   Impression and Recommendations:   This case required medical decision making of moderate complexity.  Left shoulder pain Multifactorial, impingement and glenohumeral pain. Formal physical therapy, x-rays, Celebrex which she will take with Prilosec.  She does have a history of peptic ulcer disease from years ago, has taken Prilosec for sometime now and has not had symptoms in a long time. For this reason we are going to give Celebrex to try as long as she takes it with her Prilosec. She will keep a close eye on symptoms. Return to see me in 6 weeks. We will probably get an MRI if no better.   Primary osteoarthritis of both knees Referral back to Dr. Erlinda Hong

## 2016-07-06 NOTE — Assessment & Plan Note (Addendum)
Multifactorial, impingement and glenohumeral pain. Formal physical therapy, x-rays, Celebrex which she will take with Prilosec.  She does have a history of peptic ulcer disease from years ago, has taken Prilosec for sometime now and has not had symptoms in a long time. For this reason we are going to give Celebrex to try as long as she takes it with her Prilosec. She will keep a close eye on symptoms. Return to see me in 6 weeks. We will probably get an MRI if no better.

## 2016-07-06 NOTE — Assessment & Plan Note (Signed)
Referral back to Dr. Erlinda Hong

## 2016-07-06 NOTE — Progress Notes (Signed)
   Subjective:    Patient ID: Ashley Gomez, female    DOB: [birth year], 46 y.o.   MRN: [id number]  HPI 46 yo DWP3 P3 (LC2) here because she has had what appears to be PMB. She says that she stopped having periods when her grown daughter was killed in a MVA when Shandrea was 46 yo (3 years ago). She first saw some VB 12/17 which lasted about 2 days. She has not seen any since then.    Review of Systems Pap and Mammo UTD Sexually active, denies dyspareunia She has MS     Objective:   Physical Exam [id number] Breathing, conversing, and ambulating normally Frothy vaginal discharge c/w BV Length of vagina necessitated the use of the snowman speculem  UPT negative, consent signed, time out done Cervix prepped with betadine and grasped with a single tooth tenaculum Uterus sounded to 9 cm Pipelle used for 2 passes with a small amount of tissue obtained. A gritty sensation appreciated. She tolerated the procedure well.        Assessment & Plan:  Ovarian cyst- repeat u/s in 12 weeks PMB- await patholgy results

## 2016-07-07 MED ORDER — METRONIDAZOLE 500 MG PO TABS: 500.0000 mg | ORAL_TABLET | Freq: Two times a day (BID) | ORAL | 0 refills | Status: DC

## 2016-07-10 ENCOUNTER — Telehealth: Payer: Self-pay | Admitting: *Deleted

## 2016-07-10 ENCOUNTER — Ambulatory Visit (INDEPENDENT_AMBULATORY_CARE_PROVIDER_SITE_OTHER): Payer: Self-pay | Admitting: Orthopaedic Surgery

## 2016-07-10 NOTE — Telephone Encounter (Signed)
Called pt and informed her that Dr. Hulan Fray has reviewed the ultrasound and it is negative for cancer or abnormality.  Pt voiced understanding and had no questions.

## 2016-07-12 ENCOUNTER — Telehealth: Payer: Self-pay | Admitting: *Deleted

## 2016-07-12 NOTE — Telephone Encounter (Signed)
LM on voicemail of neg pathology

## 2016-07-13 ENCOUNTER — Ambulatory Visit (INDEPENDENT_AMBULATORY_CARE_PROVIDER_SITE_OTHER): Payer: Medicare Other | Admitting: Medical

## 2016-07-13 ENCOUNTER — Encounter (HOSPITAL_COMMUNITY): Payer: Self-pay | Admitting: Medical

## 2016-07-13 VITALS — BP 120/84 | HR 86 | Resp 6 | Ht 67.0 in | Wt 263.0 lb

## 2016-07-13 DIAGNOSIS — F411 Generalized anxiety disorder: Secondary | ICD-10-CM

## 2016-07-13 DIAGNOSIS — F4321 Adjustment disorder with depressed mood: Secondary | ICD-10-CM

## 2016-07-13 DIAGNOSIS — M19011 Primary osteoarthritis, right shoulder: Secondary | ICD-10-CM

## 2016-07-13 DIAGNOSIS — Z634 Disappearance and death of family member: Secondary | ICD-10-CM

## 2016-07-13 DIAGNOSIS — F4329 Adjustment disorder with other symptoms: Secondary | ICD-10-CM

## 2016-07-13 DIAGNOSIS — G894 Chronic pain syndrome: Secondary | ICD-10-CM

## 2016-07-13 DIAGNOSIS — M1710 Unilateral primary osteoarthritis, unspecified knee: Secondary | ICD-10-CM | POA: Diagnosis not present

## 2016-07-13 DIAGNOSIS — F341 Dysthymic disorder: Secondary | ICD-10-CM | POA: Diagnosis not present

## 2016-07-13 DIAGNOSIS — F4310 Post-traumatic stress disorder, unspecified: Secondary | ICD-10-CM | POA: Diagnosis not present

## 2016-07-13 DIAGNOSIS — F607 Dependent personality disorder: Secondary | ICD-10-CM

## 2016-07-13 DIAGNOSIS — F331 Major depressive disorder, recurrent, moderate: Secondary | ICD-10-CM

## 2016-07-13 DIAGNOSIS — M5137 Other intervertebral disc degeneration, lumbosacral region: Secondary | ICD-10-CM

## 2016-07-13 DIAGNOSIS — IMO0002 Reserved for concepts with insufficient information to code with codable children: Secondary | ICD-10-CM

## 2016-07-13 MED ORDER — CLONAZEPAM 1 MG PO TABS: 1.0000 mg | ORAL_TABLET | Freq: Three times a day (TID) | ORAL | 2 refills | Status: DC | PRN

## 2016-07-13 MED ORDER — TOPIRAMATE 100 MG PO TABS: ORAL_TABLET | ORAL | 2 refills | Status: DC

## 2016-07-13 MED ORDER — DULOXETINE HCL 60 MG PO CPEP: 60.0000 mg | ORAL_CAPSULE | Freq: Every day | ORAL | 3 refills | Status: DC

## 2016-07-13 NOTE — Progress Notes (Signed)
[facility] Follow-up Outpatient Visit   Date: 07/13/2016 1:30pm [id number]   Subjective: "I'm making a little progress..still more bad days than good"  HPI Ashley Gomez is a 46 y.o. female here for FU for her PTSD with associate depression and anxiety. She also has significant chronic pain issues andis followed closely by her PCP fot this .She continues here with bi-weekly  counseling which has been helpful.She continues to report being stable on current regimine and has gotten some response to dedreased appetite and wgt by cutting her Abilify to 10 mg daily.and adding Topomax She reports she has gotten her disability and insurance . Additional history: Progress Notes by Hali Marry, MD at 02/29/2016 10:30 AM  Author: Hali Marry, MD Author Type: Physician Filed: 02/29/2016 3:04 PM  Note Status: Signed Cosign: Cosign Not Required Encounter Date: 02/29/2016  Editor: Hali Marry, MD (Physician)    Subjective:    CC:   HPI: Chronic pain management of her low back and bilateral knees. She is currently on hydrocodone 3 tabs daily. Her last urine drug screen was March, 5 mo ago. At one point was considering knee replacement. She is waiting to hear back about the financial end of it. She is also currently on gabapentin to help control her pain. And she also uses Flexeril as needed. She's also on Cymbalta. She feels like her stress levels are higher and she feels more depressed. She is living in a tent with her fiance and was kicked out of her "daughter's house".  She occ takes an extra dose of pain medications.  Smokes 1/2 ppd for 30 days.      07/03/2016     THERAPIST PROGRESS NOTE  Session Time: 2 PM to 2:50 PM Participation Level: Active Behavioral Response: CasualAlertEuthymic Type of Therapy: Individual Therapy Treatment Goals addressed:  decrease in depression, anxiety, stress management and work through grief issues Interventions: Solution  Focused, Biofeedback, Supportive, Reframing and Other: Grief processing Summary: Ashley Gomez is a 46 y.o. female who difficulties of past week related to ex-brother in law who passed away, one of his son's friends passed away, her uncle passing away on the 12th and ex-father-in-law trouble breathing, hole in one of his lung, and needing surgery on heart. Her daughter and son-in-law also had side tire blowout while driving with grandchildren in the car. Shared that it has been one thing after another and made her wonder who is next. She reviewed comments by friends and family after hurts daughter's death and therapist discussed with patient how allowing oneself to grieve is healthy part of process. Patient said if she knew the state of the driver it may help her with forgiveness but then related receiving hospital report for her daughter actually made things worse. Patient watched the [facility] and wants to watch it again because of things that confused her. Patient shared how the movie explains that we won't be able to live or love again until we are able to forgive. She wrote in her journal question of how to forgive. Therapist discussed with patient that the movie could be helpful for her as it is meant to help people work through tragedies and discussed with patient that coping with tragedies involves something other than knowing all the answers and that healing from grief is helpful for patient as it helps her to move on from suffering.  Suicidal/Homicidal: No Therapist Response: Reviewed progress and symptoms and utilize reframing to help patient work through feelings  and grief issues triggered by recent losses. Continue to help patient gain insight to help through grieving process by helping her find meaning and purpose in her life from the loss, acceptance and letting go of things beyond her control to help her focus on making the most of her own life. Help patient process feelings and session.  Provided positive feedback for patient exploring resources to help her with her own coping as well as using her journal to help her in gaining insight and coping. Provided strength based and supportive interventions. Plan: Return again in 2 weeks.2.Patient review the "Sh[employer]" to help her in gaining more insight to coping.3. She can continue to work on his coping strategies to manage mood, and grief issues Diagnosis:      Axis I:   Post Traumatic Stress Disorder and Complicated grief, persistent depressive disorder                         Axis II: No diagnosis [name],[name], LCSW 07/03/2016                                                                                                                                                                                              Current Medications:1`1`1`1`1`1`1`1`1`1`1`1`1`1`1`1`1`1`1`1`1`1`1`1`1`1`  Prescription Sig. Disp. Refills Start Date End Date Status  gabapentin (NEURONTIN) 300 mg capsule  Take by mouth.   05/04/2014  Active  nortriptyline HCl (PAMELOR) 50 mg capsule  Take by mouth.   04/03/2014  Active  vilazodone (VIIBRYD) 40 mg TABS tablet  Take by mouth.   10/01/2013  Active  diazepam (VALIUM) 10 mg tablet  Take by mouth.   06/04/2014  Active  hydrochlorothiazide (HYDRODIURIL) 25 mg tablet  TAKE ONE TABLET BY MOUTH DAILY AS NEEDED   06/04/2014  Active  HYDROcodone-acetaminophen (NORCO) 7.5-325 mg per tablet  TAKE ONE TABLET BY MOUTH EVERY EIGHT HOURS AS NEEDED FOR PAIN   06/04/2014  Active  losartan potassium (COZAAR) 50 mg tablet  TAKE ONE TABLET BY MOUTH ONE TIME DAILY   06/04/2014  Active  promethazine (PHENERGAN) 50 MG tablet  Take every 6 hours as needed for nausea or migraine.   09/12/2012  Active  SUMAtriptan succinate (IMITREX) 100 mg tablet  Take by mouth.   05/04/2014  Active  topiramate (TOPAMAX) 100 MG tablet  Take by mouth.   05/04/2014  Active  buPROPion HCl (WELLBUTRIN XL) 150 mg 24  hr tablet  Take 150 mg by mouth daily.     Active  ondansetron (ZOFRAN-ODT) 4 mg disintegrating tablet  Take one tablet (4 mg total) by mouth every  6 (six) hours as needed for Nausea. 20 tablet  0 10/29/2014  Active  dicyclomine (BENTYL) 20 mg tablet  Take one tablet (20 mg total) by mouth every 6 (six) hours as needed. 30 tablet  0 10/29/2014  Active    Review of Systems: Psychiatric: Agitation: No Hallucination: No Depressed Mood:  Continues to improve slowly Insomnia: No Hypersomnia: No Altered Concentration: No Feels Worthless: At times Grandiose Ideas: No Belief In Special Powers: No New/Increased Substance Abuse: No NCCSRS negative Compulsions: No  Neurologic: Headache: No Seizure: No Paresthesias: No  Vital Signs BP 124/80 P-99 Hgt 5'6" Wgt 261 lbs BMI 42.2  Mental Status Examination  Appearance: Alert: Yes Attention: good  Cooperative: Yes Eye Contact: Good Speech: Clear and coherent Psychomotor Activity: Normal Memory/Concentration: Normal/intact Oriented: person, place, time/date and situation Mood: Euthymic Affect: Appropriate and Congruent Thought Processes and Associations: Coherent and Intact Fund of Knowledge: Good Thought Content: WDL Insight: Good Judgement: Good  Labs:Results for REENIE, KOY (MRN [id number]) as of 03/17/2016 13:52  Ref. Range 10/28/2015 11:46 11/03/2015 11:46 11/03/2015 11:48 11/03/2015 11:49 12/28/2015 10:35  Sodium Latest Ref Range: 135 - 146 mmol/L 139      Potassium Latest Ref Range: 3.5 - 5.3 mmol/L 4.1      Chloride Latest Ref Range: 98 - 110 mmol/L 110      CO2 Latest Ref Range: 20 - 31 mmol/L 20      BUN Latest Ref Range: 7 - 25 mg/dL 14      Creatinine Latest Ref Range: > or = 20.0 mg/dL 0.82    64.5  Calcium Latest Ref Range: 8.6 - 10.2 mg/dL 8.7      Glucose Latest Ref Range: 65 - 99 mg/dL 92      Alkaline Phosphatase Latest Ref Range: 33 - 115 U/L 79      Albumin Latest Ref Range: 3.6 - 5.1 g/dL 3.8        AST Latest Ref Range: 10 - 35 U/L 15      ALT Latest Ref Range: 6 - 29 U/L 9      Total Protein Latest Ref Range: 6.1 - 8.1 g/dL 6.5      Total Bilirubin Latest Ref Range: 0.2 - 1.2 mg/dL 0.3      GFR, Est African American Latest Ref Range: >=60 mL/min >89      GFR, Est Non African American Latest Ref Range: >=60 mL/min 86      Cholesterol Latest Ref Range: 125 - 200 mg/dL 231 (H)      Triglycerides Latest Ref Range: <150 mg/dL 231 (H)      HDL Cholesterol Latest Ref Range: >=46 mg/dL 47      LDL (calc) Latest Ref Range: <130 mg/dL 138 (H)      VLDL Latest Ref Range: <30 mg/dL 46 (H)      Total CHOL/HDL Ratio Latest Ref Range: <=5.0 Ratio 4.9      WBC Latest Ref Range: 3.8 - 10.8 K/uL 5.7      RBC Latest Ref Range: 3.80 - 5.10 MIL/uL 4.94      Hemoglobin Latest Ref Range: 11.7 - 15.5 g/dL 14.3      HCT Latest Ref Range: 35.0 - 45.0 % 42.9      MCV Latest Ref Range: 80.0 - 100.0 fL 86.8      MCH Latest Ref Range: 27.0 - 33.0 pg 28.9      MCHC Latest Ref Range: 32.0 - 36.0 g/dL 33.3  RDW Latest Ref Range: 11.0 - 15.0 % 14.0      Platelets Latest Ref Range: 140 - 400 K/uL 249      MPV Latest Ref Range: 7.5 - 12.5 fL 10.4        Hydromorphone Latest Ref Range: <50 ng/mL     54 (H)  NORDIAZEPAM Latest Ref Range: <50 ng/mL     NEGATIVE  LH Latest Units: mIU/mL 39.9      FSH Latest Units: mIU/mL 39.5      Estradiol Latest Units: pg/mL 15      Progesterone Latest Units: ng/mL <0.5      TSH Latest Units: mIU/L 0.80        Codeine Latest Ref Range: <50 ng/mL     NEGATIVE  Please Note (HCV): Unknown     .  pH Latest Ref Range: 4.5 - 9.0      7.11  Phencyclidine Latest Ref Range: <25 ng/mL     NEGATIVE  Cocaine Metabolite Latest Ref Range: <150 ng/mL     NEGATIVE    Morphine Latest Ref Range: <50 ng/mL     NEGATIVE  Marijuana Metabolite Latest Ref Range: <20 ng/mL     NEGATIVE  Amphetamines Latest Ref Range: <500 ng/mL     NEGATIVE  Barbiturates Latest Ref Range: <300 ng/mL      NEGATIVE  Benzodiazepines Latest Ref Range: <100 ng/mL     POSITIVE (A)  Hydrocodone Latest Ref Range: <50 ng/mL     150 (H)  Opiates Latest Ref Range: <100 ng/mL     POSITIVE (A)  OXYCODONE Latest Ref Range: <100 ng/mL     NEGATIVE  LORAZEPAM Latest Ref Range: <50 ng/mL     NEGATIVE  OXAZEPAM Latest Ref Range: <50 ng/mL     NEGATIVE  TEMAZEPAM Latest Ref Range: <50 ng/mL     NEGATIVE  MM DIGITAL SCREENING BILATERAL Unknown    Rpt   US TRANSVAGINAL NON-OB Unknown   Rpt    US PELVIS COMPLETE Unknown  Rpt     6 Acetylmorphine Latest Ref Range: <10 ng/mL     NEGATIVE  Alcohol Metabolites Latest Ref Range: <500 ng/mL     NEGATIVE  Alphahydroxyalprazolam Latest Ref Range: <25 ng/mL     NEGATIVE  Alphahydroxymidazolam Latest Ref Range: <50 ng/mL     NEGATIVE  Alphahydroxytriazolam Latest Ref Range: <50 ng/mL     NEGATIVE  Aminoclonazepam Latest Ref Range: <25 ng/mL     436 (H)  Hydroxyethylflurazepam Latest Ref Range: <50 ng/mL     NEGATIVE  Methadone Metabolite Latest Ref Range: <100 ng/mL     NEGATIVE  Norhydrocodone Latest Ref Range: <50 ng/mL     355 (H)  Oxidant Latest Ref Range: <200 mcg/mL     NEGATIVE    Visit Diagnosis:             ICD-9-CM ICD-10-CM        1.   PTSD (post-traumatic stress disorder) 309.81 F43.10       2.   Depression, major, recurrent, moderate (HCC) 296.32 F33.1       3.   Complicated grief [id number]...       4.   Chronic pain syndrome 338.4 G89.4       5.   Morbid obesity, unspecified obesity type ([place]) 278.01 E66.01       6.   Dysthymic disorder 300.4 F34.1       7.   Anxiety state 300.00 F41.1  8.   Dependent personality disorder 301.6 F60.7       9.   DDD (degenerative disc disease), lumbosacral 722.52 M51.37       10.   Osteoarthrosis involving lower leg 715.96 M17.10       11.   Osteoarthritis of right shoulder, unspecified osteoarthritis type 715.91 M19.011        Treatment Plan/Recommendations:  Plan of Care:PTSD depression/anxiety  -continue current meds and Counseling  Laboratory:  per Dr Madilyn Fireman  Psychotherapy: [facility]  Medications: Continue current Psychiatric Rx-see list for all meds  Routine PRN Medications:  Yes Klonopin  Consultations: None at ths time  Safety Concerns:  abuse of medications/dependence with risk factors(PTSD)  Other:  NA     Darlyne Russian, PA-C 07/13/2016

## 2016-07-14 ENCOUNTER — Ambulatory Visit (INDEPENDENT_AMBULATORY_CARE_PROVIDER_SITE_OTHER): Payer: Self-pay | Admitting: Orthopaedic Surgery

## 2016-07-17 ENCOUNTER — Encounter (INDEPENDENT_AMBULATORY_CARE_PROVIDER_SITE_OTHER): Payer: Self-pay | Admitting: Orthopaedic Surgery

## 2016-07-17 ENCOUNTER — Ambulatory Visit (INDEPENDENT_AMBULATORY_CARE_PROVIDER_SITE_OTHER): Payer: Medicare Other | Admitting: Orthopaedic Surgery

## 2016-07-17 DIAGNOSIS — M1712 Unilateral primary osteoarthritis, left knee: Secondary | ICD-10-CM

## 2016-07-17 NOTE — Progress Notes (Signed)
Office Visit Note   Patient: Ashley Gomez           Date of Birth: Nov 25, 1970           MRN: [id number] Visit Date: 07/17/2016              Requested by: Silverio Decamp, MD [address] [place], [place] [address] PCP: Beatrice Lecher, MD   Assessment & Plan: Visit Diagnoses:  1. Unilateral primary osteoarthritis, left knee     Plan: Patient has advanced medial compartment osteoarthritis. She is at the point where she has failed conservative treatment and wishes to undergo a partial knee replacement. I demonstrated this on a model including the risks benefits alternatives to surgery she wishes to proceed. She did have a history of a DVT which required Lovenox. I plan on putting her on 4 weeks of the xarelto  Follow-Up Instructions: No Follow-up on file.   Orders:  No orders of the defined types were placed in this encounter.  No orders of the defined types were placed in this encounter.     Procedures: No procedures performed   Clinical Data: No additional findings.   Subjective: Chief Complaint  Patient presents with  . Left Knee - Pain    Patient is a 46 year female who is well-known to me. I had performed a left knee arthroscopy with chondroplasty and partial medial meniscectomy. She did have advanced chondromalacia of the medial compartment. She comes in today with severe pain on the medial aspect of the knee that radiates to the lateral aspect of the knee as failed extensive conservative treatment and she wishes to discuss a partial knee replacement. She has tingling numbness and burning. She takes hydrocodone which does not give her any significant relief. Her quality of life is significantly declined.    Review of Systems  Constitutional: Negative.   HENT: Negative.   Eyes: Negative.   Respiratory: Negative.   Cardiovascular: Negative.   Endocrine: Negative.   Musculoskeletal: Negative.   Neurological: Negative.   Hematological:  Negative.   Psychiatric/Behavioral: Negative.   All other systems reviewed and are negative.    Objective: Vital Signs: LMP 12/28/2014   Physical Exam  Constitutional: She is oriented to person, place, and time. She appears well-developed and well-nourished.  Pulmonary/Chest: Effort normal.  Neurological: She is alert and oriented to person, place, and time.  Skin: Skin is warm. Capillary refill takes less than 2 seconds.  Psychiatric: She has a normal mood and affect. Her behavior is normal. Judgment and thought content normal.  Nursing note and vitals reviewed.   Ortho Exam Exam of the left knee shows no joint effusion. She has excellent range of motion. She has exquisite medial joint line tenderness. Collaterals and cruciates are stable. Specialty Comments:  No specialty comments available.  Imaging: No results found.   PMFS History: Patient Active Problem List   Diagnosis Date Noted  . Unilateral primary osteoarthritis, left knee 07/17/2016  . Left shoulder pain 07/06/2016  . Grief at loss of child 12/23/2015  . Noncompliance with medication treatment due to overuse of medication 11/11/2015  . Trochanteric bursitis of left hip 03/31/2015  . Dependent personality disorder 12/03/2014  . Chronic pain syndrome 12/03/2014  . Myofascial pain 11/09/2014  . Dysthymic disorder 10/15/2014  . PTSD (post-traumatic stress disorder) 10/15/2014  . Menopausal and perimenopausal disorder 10/04/2012  . Primary osteoarthritis of both knees 12/21/2011  . B12 DEFICIENCY 06/21/2010  . UNSPECIFIED ANEMIA 06/21/2010  .  NECK PAIN, CHRONIC 06/21/2010  . VARICOSE VEINS, LOWER EXTREMITIES 05/17/2010  . Post-menopausal 05/17/2010  . POSTURAL LIGHTHEADEDNESS 05/17/2010  . NUMBNESS, ARM 04/04/2010  . DVT 01/13/2010  . SEIZURE DISORDER 01/13/2010  . Depression, major, recurrent, moderate ([place]) 05/16/2009  . OBESITY, UNSPECIFIED 03/09/2009  . Anxiety state 03/09/2009  . CIGARETTE SMOKER  03/09/2009  . MULTIPLE SCLEROSIS, RELAPSING/REMITTING 03/09/2009  . MIGRAINE WITHOUT AURA 03/09/2009  . ESSENTIAL HYPERTENSION, BENIGN 03/09/2009  . Left lumbar radiculopathy 03/09/2009   Past Medical History:  Diagnosis Date  . Anxiety   . CVA (cerebral infarction)    Old CVA on MRI brain  . Depression   . DVT (deep venous thrombosis) ([place]) [id number]   L basilic vein   . Facet hypertrophy of lumbar region    MRI 2007  . Fibromyalgia   . Hypertension    no meds now, lost 50lbs  . Migraines   . MS (multiple sclerosis) ([place])   . Neuromuscular disorder ([place])   . Obesity   . PTSD (post-traumatic stress disorder)   . Seizures ([place]) 2011   no seizure since onset  . Sleep apnea    diagnosed 20 ago, lost wt no CPAP now  . Smoking     Family History  Problem Relation Age of Onset  . Cancer Mother 89    melanoma  . Hypertension Sister   . Heart disease Sister     valve disease  . Depression Sister   . Diabetes Sister   . Sjogren's syndrome Sister     Past Surgical History:  Procedure Laterality Date  . CHONDROPLASTY Left 12/30/2014   Procedure: CHONDROPLASTY;  Surgeon: Leandrew Koyanagi, MD;  Location: [facility];  Service: Orthopedics;  Laterality: Left;  . KNEE ARTHROSCOPY Left 12/30/2014   Procedure: LEFT KNEE ARTHROSCOPY WITH   CHONDROPLASTY;  Surgeon: Leandrew Koyanagi, MD;  Location: [facility];  Service: Orthopedics;  Laterality: Left;  . TONSILLECTOMY    . TUBAL LIGATION  1992   Social History   Occupational History  . Not on file.   Social History Main Topics  . Smoking status: Current Every Day Smoker    Packs/day: 1.00    Years: 25.00    Types: Cigarettes  . Smokeless tobacco: Never Used     Comment: advised to d/c by 3 cigs per week.  . Alcohol use No     Comment: occasional  . Drug use: No  . Sexual activity: Yes    Birth control/ protection: None, Post-menopausal

## 2016-07-18 ENCOUNTER — Ambulatory Visit (INDEPENDENT_AMBULATORY_CARE_PROVIDER_SITE_OTHER): Payer: Medicare Other | Admitting: Licensed Clinical Social Worker

## 2016-07-18 DIAGNOSIS — F341 Dysthymic disorder: Secondary | ICD-10-CM | POA: Diagnosis not present

## 2016-07-18 DIAGNOSIS — F4310 Post-traumatic stress disorder, unspecified: Secondary | ICD-10-CM

## 2016-07-18 DIAGNOSIS — F4321 Adjustment disorder with depressed mood: Secondary | ICD-10-CM

## 2016-07-18 DIAGNOSIS — Z634 Disappearance and death of family member: Secondary | ICD-10-CM

## 2016-07-18 DIAGNOSIS — F4329 Adjustment disorder with other symptoms: Secondary | ICD-10-CM

## 2016-07-18 NOTE — Progress Notes (Signed)
   THERAPIST PROGRESS NOTE  Session Time: 10:05 AM to 10:55 AM  Participation Level: Active  Behavioral Response: CasualAlertDysphoric and Tearful  Type of Therapy: Individual Therapy  Treatment Goals addressed:  decrease in depression, anxiety, stress management and work through grief issues  Interventions: Solution Focused, Strength-based, Supportive and Other: Grief focused interventions  Summary: EBECCA CAVASOS is a 46 y.o. female who presents with being able to have partial knee replacement from knee injury in a couple of weeks. Identified this is helping with mobility that will help her in feeling better. Discussed having several losses lately and her reaction is wondering when it's going to end. Read book recommended by therapist for help on grief called "option B" and wrote 3 pages of notes in her journal. There was a story in a book about a woman who lost her daughter and that in life we are faced with things not going away that we wanted them. This can happen more than once in life so that we're looking at not only at accepting an alternative life course but having to cope with another unexpected new direction in life. Patient described that experience for herself and not having her daughter and having to accept being on her own. Patient was tearful throughout session and sharing her feelings. Continues to describe difficulty forgiving person who cause daughter's accident. Discussed how the movie the shack dealt with this by raising the question of our ability to judge and recognizing a larger perspective than the one we have on the situation. Patient related that some days it's hard to move forward and struggles because she wants to know what happened. Discussed with patient not losing sight of the value of our life even when things don't go as expected.   Suicidal/Homicidal: No  Therapist Response: Reviewed progress and symptoms. Provided positive feedback for work patient is doing on  her own to work on grief issues and related this will help her with progress. Worked on feelings of guilt related to loss by helping to raise insight to limited ability to control of events outside of herself. Help in raising awareness for healthy coping that when life doesn't go as we hope we still has to recognize and not let go of the value of our own lives. Help patient process feelings around family stressors and grief. Educated patient on healthy emotional regulation involves occurred in session as patient has a feeling, acknowledges it and let it run its course. Provided supportive and strength-based interventions.  Plan: Return again in 1 weeks.2.She can continue to work on his coping strategies to manage mood, and grief issues  Diagnosis: Axis I:  Post Traumatic Stress Disorder and Complicated grief, persistent depressive disorder    Axis II: No diagnosis    Iosefa Weintraub A, LCSW 07/18/2016

## 2016-07-24 ENCOUNTER — Ambulatory Visit (HOSPITAL_COMMUNITY): Payer: Self-pay | Admitting: Licensed Clinical Social Worker

## 2016-07-26 ENCOUNTER — Other Ambulatory Visit (INDEPENDENT_AMBULATORY_CARE_PROVIDER_SITE_OTHER): Payer: Self-pay | Admitting: Orthopaedic Surgery

## 2016-07-26 ENCOUNTER — Other Ambulatory Visit: Payer: Self-pay

## 2016-07-26 DIAGNOSIS — M1712 Unilateral primary osteoarthritis, left knee: Secondary | ICD-10-CM

## 2016-07-26 MED ORDER — HYDROCODONE-ACETAMINOPHEN 7.5-325 MG PO TABS: 1.0000 | ORAL_TABLET | Freq: Three times a day (TID) | ORAL | 0 refills | Status: DC | PRN

## 2016-07-28 NOTE — Pre-Procedure Instructions (Addendum)
Ashley Gomez  07/28/2016      [id number] - [place], [place] - [address] [place] [address] Phone: [phone] Fax: [phone]  [facility], [place] [address] Phone: [phone] Fax: [phone]    Your procedure is scheduled on March 22.  Report to [facility] Admitting at 115   Call this number if you have problems the morning of surgery:  [phone]   Remember:  Do not eat food or drink liquids after midnight.  Take these medicines the morning of surgery with A SIP OF WATER Tylenol if needed, Abilify, Clonazepam (Klonopin)if needed, Flexeril if needed, cymbalta, Gabapentin (Neurontin), Hydrocodone (Norco) if needed, Omeprazole (Prilosec), Imitrex if needed  Stop taking Aspirin, BC's, Goody's, Herbal medications, Fish Oil, Ibuprofen, Advil, Motrin, Aleve, Vitamins, Celebrex    How to Manage Your Diabetes Before and After Surgery  Why is it important to control my blood sugar before and after surgery? . Improving blood sugar levels before and after surgery helps healing and can limit problems. . A way of improving blood sugar control is eating a healthy diet by: o  Eating less sugar and carbohydrates o  Increasing activity/exercise o  Talking with your doctor about reaching your blood sugar goals . High blood sugars (greater than 180 mg/dL) can raise your risk of infections and slow your recovery, so you will need to focus on controlling your diabetes during the weeks before surgery. . Make sure that the doctor who takes care of your diabetes knows about your planned surgery including the date and location.  How do I manage my blood sugar before surgery? . Check your blood sugar at least 4 times a day, starting 2 days before surgery, to make sure that the level is not too high or low. o Check your blood sugar the morning of  your surgery when you wake up and every 2 hours until you get to the Short Stay unit. . If your blood sugar is less than 70 mg/dL, you will need to treat for low blood sugar: o Do not take insulin. o Treat a low blood sugar (less than 70 mg/dL) with  cup of clear juice (cranberry or apple), 4 glucose tablets, OR glucose gel. o Recheck blood sugar in 15 minutes after treatment (to make sure it is greater than 70 mg/dL). If your blood sugar is not greater than 70 mg/dL on recheck, call [phone] for further instructions. . Report your blood sugar to the short stay nurse when you get to Short Stay.  . If you are admitted to the hospital after surgery: o Your blood sugar will be checked by the staff and you will probably be given insulin after surgery (instead of oral diabetes medicines) to make sure you have good blood sugar levels. o The goal for blood sugar control after surgery is 80-180 mg/dL.       WHAT DO I DO ABOUT MY DIABETES MEDICATION?   Marland Kitchen Do not take oral diabetes medicines (pills) the morning of surgery. Metformin (Glucophage)        Other Instructions:          Patient Signature:  Date:   Nurse Signature:  Date:   Reviewed and Endorsed by [facility] Patient Education Committee, August 2015 Do not wear jewelry, make-up or nail polish.  Do not wear lotions, powders, or perfumes,  or deoderant.  Do not shave 48 hours prior to surgery.  Men may shave face and neck.  Do not bring valuables to the hospital.  [facility] is not responsible for any belongings or valuables.  Contacts, dentures or bridgework may not be worn into surgery.  Leave your suitcase in the car.  After surgery it may be brought to your room.  For patients admitted to the hospital, discharge time will be determined by your treatment team.  Patients discharged the day of surgery will not be allowed to drive home.   Special instructions:  [facility] - Preparing for Surgery  Before surgery, you  can play an important role.  Because skin is not sterile, your skin needs to be as free of germs as possible.  You can reduce the number of germs on you skin by washing with CHG (chlorahexidine gluconate) soap before surgery.  CHG is an antiseptic cleaner which kills germs and bonds with the skin to continue killing germs even after washing.  Please DO NOT use if you have an allergy to CHG or antibacterial soaps.  If your skin becomes reddened/irritated stop using the CHG and inform your nurse when you arrive at Short Stay.  Do not shave (including legs and underarms) for at least 48 hours prior to the first CHG shower.  You may shave your face.  Please follow these instructions carefully:   1.  Shower with CHG Soap the night before surgery and the                                morning of Surgery.  2.  If you choose to wash your hair, wash your hair first as usual with your       normal shampoo.  3.  After you shampoo, rinse your hair and body thoroughly to remove the                      Shampoo.  4.  Use CHG as you would any other liquid soap.  You can apply chg directly       to the skin and wash gently with scrungie or a clean washcloth.  5.  Apply the CHG Soap to your body ONLY FROM THE NECK DOWN.        Do not use on open wounds or open sores.  Avoid contact with your eyes,       ears, mouth and genitals (private parts).  Wash genitals (private parts)       with your normal soap.  6.  Wash thoroughly, paying special attention to the area where your surgery        will be performed.  7.  Thoroughly rinse your body with warm water from the neck down.  8.  DO NOT shower/wash with your normal soap after using and rinsing off       the CHG Soap.  9.  Pat yourself dry with a clean towel.            10.  Wear clean pajamas.            11.  Place clean sheets on your bed the night of your first shower and do not        sleep with pets.  Day of Surgery  Do not apply any lotions/deoderants the  morning of surgery.  Please wear clean clothes to the hospital/surgery  center.     Please read over the following fact sheets that you were given. Pain Booklet, Coughing and Deep Breathing, MRSA Information and Surgical Site Infection Prevention

## 2016-07-31 ENCOUNTER — Encounter (HOSPITAL_COMMUNITY): Payer: Self-pay

## 2016-07-31 ENCOUNTER — Encounter (HOSPITAL_COMMUNITY)
Admission: RE | Admit: 2016-07-31 | Discharge: 2016-07-31 | Disposition: A | Discharge status: Home / Self Care | Payer: Medicare Other | Source: Ambulatory Visit | Attending: Orthopaedic Surgery | Admitting: Orthopaedic Surgery

## 2016-07-31 DIAGNOSIS — F419 Anxiety disorder, unspecified: Secondary | ICD-10-CM | POA: Diagnosis not present

## 2016-07-31 DIAGNOSIS — M1712 Unilateral primary osteoarthritis, left knee: Secondary | ICD-10-CM | POA: Diagnosis not present

## 2016-07-31 DIAGNOSIS — Z8673 Personal history of transient ischemic attack (TIA), and cerebral infarction without residual deficits: Secondary | ICD-10-CM | POA: Diagnosis not present

## 2016-07-31 DIAGNOSIS — E669 Obesity, unspecified: Secondary | ICD-10-CM | POA: Insufficient documentation

## 2016-07-31 DIAGNOSIS — G35 Multiple sclerosis: Secondary | ICD-10-CM | POA: Diagnosis not present

## 2016-07-31 DIAGNOSIS — Z86718 Personal history of other venous thrombosis and embolism: Secondary | ICD-10-CM | POA: Diagnosis not present

## 2016-07-31 DIAGNOSIS — F17210 Nicotine dependence, cigarettes, uncomplicated: Secondary | ICD-10-CM | POA: Diagnosis not present

## 2016-07-31 DIAGNOSIS — M797 Fibromyalgia: Secondary | ICD-10-CM | POA: Insufficient documentation

## 2016-07-31 DIAGNOSIS — Z79899 Other long term (current) drug therapy: Secondary | ICD-10-CM | POA: Diagnosis not present

## 2016-07-31 DIAGNOSIS — Z01812 Encounter for preprocedural laboratory examination: Secondary | ICD-10-CM | POA: Diagnosis not present

## 2016-07-31 DIAGNOSIS — Z7984 Long term (current) use of oral hypoglycemic drugs: Secondary | ICD-10-CM | POA: Diagnosis not present

## 2016-07-31 DIAGNOSIS — Z6841 Body Mass Index (BMI) 40.0 and over, adult: Secondary | ICD-10-CM | POA: Diagnosis not present

## 2016-07-31 DIAGNOSIS — G43909 Migraine, unspecified, not intractable, without status migrainosus: Secondary | ICD-10-CM | POA: Diagnosis not present

## 2016-07-31 DIAGNOSIS — D62 Acute posthemorrhagic anemia: Secondary | ICD-10-CM | POA: Diagnosis not present

## 2016-07-31 DIAGNOSIS — F329 Major depressive disorder, single episode, unspecified: Secondary | ICD-10-CM | POA: Diagnosis not present

## 2016-07-31 DIAGNOSIS — K219 Gastro-esophageal reflux disease without esophagitis: Secondary | ICD-10-CM | POA: Diagnosis not present

## 2016-07-31 DIAGNOSIS — I10 Essential (primary) hypertension: Secondary | ICD-10-CM | POA: Diagnosis not present

## 2016-07-31 HISTORY — DX: Unspecified osteoarthritis, unspecified site: M19.90

## 2016-07-31 HISTORY — DX: Gastro-esophageal reflux disease without esophagitis: K21.9

## 2016-07-31 HISTORY — DX: Cerebral infarction, unspecified: I63.9

## 2016-07-31 LAB — CBC WITH DIFFERENTIAL/PLATELET
Basophils Absolute: 0 10*3/uL (ref 0.0–0.1)
Basophils Relative: 0 %
Eosinophils Absolute: 0.2 10*3/uL (ref 0.0–0.7)
Eosinophils Relative: 3 %
HCT: 46.4 % — ABNORMAL HIGH (ref 36.0–46.0)
Hemoglobin: 15.3 g/dL — ABNORMAL HIGH (ref 12.0–15.0)
Lymphocytes Relative: 30 %
Lymphs Abs: 2 10*3/uL (ref 0.7–4.0)
MCH: 29 pg (ref 26.0–34.0)
MCHC: 33 g/dL (ref 30.0–36.0)
MCV: 87.9 fL (ref 78.0–100.0)
Monocytes Absolute: 0.4 10*3/uL (ref 0.1–1.0)
Monocytes Relative: 6 %
Neutro Abs: 4.1 10*3/uL (ref 1.7–7.7)
Neutrophils Relative %: 61 %
Platelets: 247 10*3/uL (ref 150–400)
RBC: 5.28 MIL/uL — ABNORMAL HIGH (ref 3.87–5.11)
RDW: 13.6 % (ref 11.5–15.5)
WBC: 6.7 10*3/uL (ref 4.0–10.5)

## 2016-07-31 LAB — URINALYSIS, MICROSCOPIC (REFLEX): RBC / HPF: NONE SEEN RBC/hpf (ref 0–5)

## 2016-07-31 LAB — COMPREHENSIVE METABOLIC PANEL
ALT: 13 U/L — ABNORMAL LOW (ref 14–54)
AST: 23 U/L (ref 15–41)
Albumin: 3.8 g/dL (ref 3.5–5.0)
Alkaline Phosphatase: 83 U/L (ref 38–126)
Anion gap: 7 (ref 5–15)
BUN: 12 mg/dL (ref 6–20)
CO2: 25 mmol/L (ref 22–32)
Calcium: 8.9 mg/dL (ref 8.9–10.3)
Chloride: 108 mmol/L (ref 101–111)
Creatinine, Ser: 0.77 mg/dL (ref 0.44–1.00)
GFR calc Af Amer: >60 mL/min (ref >60)
GFR calc non Af Amer: >60 mL/min (ref >60)
Glucose, Bld: 92 mg/dL (ref 65–99)
Potassium: 4.2 mmol/L (ref 3.5–5.1)
Sodium: 140 mmol/L (ref 135–145)
Total Bilirubin: 0.3 mg/dL (ref 0.3–1.2)
Total Protein: 6.7 g/dL (ref 6.5–8.1)

## 2016-07-31 LAB — URINALYSIS, ROUTINE W REFLEX MICROSCOPIC
Bilirubin Urine: NEGATIVE
Glucose, UA: NEGATIVE mg/dL
Hgb urine dipstick: NEGATIVE
Ketones, ur: NEGATIVE mg/dL
Nitrite: NEGATIVE
Protein, ur: NEGATIVE mg/dL
Specific Gravity, Urine: 1.025 (ref 1.005–1.030)
pH: 5.5 (ref 5.0–8.0)

## 2016-07-31 LAB — PROTIME-INR
INR: 0.93
Prothrombin Time: 12.5 seconds (ref 11.4–15.2)

## 2016-07-31 LAB — APTT: aPTT: 27 seconds (ref 24–36)

## 2016-07-31 LAB — SURGICAL PCR SCREEN
MRSA, PCR: NEGATIVE
Staphylococcus aureus: NEGATIVE

## 2016-07-31 LAB — SEDIMENTATION RATE: Sed Rate: 5 mm/hr (ref 0–22)

## 2016-07-31 LAB — TYPE AND SCREEN
ABO/RH(D): O POS
Antibody Screen: NEGATIVE

## 2016-07-31 LAB — ABO/RH: ABO/RH(D): O POS

## 2016-07-31 LAB — C-REACTIVE PROTEIN: CRP: 1.1 mg/dL — ABNORMAL HIGH (ref <1.0)

## 2016-07-31 NOTE — Progress Notes (Signed)
PCP is Dr. Beatrice Lecher Denies ever seeing a cardiologist. Denies any chest pain. Denies ever having a card cath. States she had a stress test over 10 years ago, and an echo 10-21-14. Reports she takes Metformin to loose weight and was put on it by Dr Madilyn Fireman.

## 2016-08-01 ENCOUNTER — Ambulatory Visit (INDEPENDENT_AMBULATORY_CARE_PROVIDER_SITE_OTHER): Payer: Medicare Other | Admitting: Licensed Clinical Social Worker

## 2016-08-01 DIAGNOSIS — F4310 Post-traumatic stress disorder, unspecified: Secondary | ICD-10-CM

## 2016-08-01 DIAGNOSIS — F341 Dysthymic disorder: Secondary | ICD-10-CM

## 2016-08-01 DIAGNOSIS — F4321 Adjustment disorder with depressed mood: Secondary | ICD-10-CM

## 2016-08-01 DIAGNOSIS — Z634 Disappearance and death of family member: Secondary | ICD-10-CM

## 2016-08-01 DIAGNOSIS — F4329 Adjustment disorder with other symptoms: Secondary | ICD-10-CM | POA: Diagnosis not present

## 2016-08-01 LAB — HEMOGLOBIN A1C
Hgb A1c MFr Bld: 5.6 % (ref 4.8–5.6)
Mean Plasma Glucose: 114 mg/dL

## 2016-08-01 NOTE — Progress Notes (Signed)
   THERAPIST PROGRESS NOTE  Session Time: 12:30 PM to 1:25 PM  Participation Level: Active  Behavioral Response: CasualAlertEuthymic  Type of Therapy: Individual Therapy  Treatment Goals addressed: decrease in depression, anxiety, stress management and work through grief issues  Interventions: Solution Focused, Strength-based, Supportive, Reframing and Other: Grief processing, healthy relationship skills  Summary: Ashley Gomez is a 46 y.o. female who presents with nervousness about surgery. She sees the benefits but also express some of her concerns and anxiety. She recognizes best option is to go ahead with surgery. Shared that she is moving in April but also the stress of recovering from surgery while moving. Shared notes from internal feeling as if she is "at option C and not being able to count on anybody". Shared that had a close relationship with aunt but relationship is strained and efforts to be friendly have been resisted by aunt. Shared with therapist that she is not going to stress about it and only somewhat she can do. Therapist explored with patient other people in her life who are helping her through this stressful period. Patient discussed difficulties of boxing up photos of deceased daughter. She does want to put a photo album together for the kids and therapist discussed with patient ways she will be a connection for her daughter's kids and telling them about their mom. Reviewed session and patient related it is helpful to talk about stressors including problems in relationship with aunt.   Suicidal/Homicidal: No  Therapist Response: Therapist help patient process feelings around strain in one of her relationships and upcoming stressor of surgery. Utilized reframing and worked with patient on healthy coping related to looking at the benefits of the surgery. Process patient's feelings about feeling alone and utilized reframing to help her see some of the supports in her life.  Explored healthy relationship skills with relationship that has become distant and provided positive feedback for patient's affective coping and realizing she could only do her part and not to stress about it. Help patient to process feelings around grief and discussed healthy coping strategies include finding a constructive outlet for grief and healthy management of emotions is letting feelings from the course. Provided supportive and strength-based intervention.  Plan: Return again in 3 weeks.2. Patient continued to gain insight to effective stress management strategies and coping skills for emotions and implement an to life situations  Diagnosis: Axis I:  Post Traumatic Stress Disorder and Complicated grief, persistent depressive disorder    Axis II: No diagnosis    Duriel Deery A, LCSW 08/01/2016

## 2016-08-02 MED ORDER — TRANEXAMIC ACID 1000 MG/10ML IV SOLN
1000.0000 mg | INTRAVENOUS | Status: AC
  Administered 2016-08-03: 1000 mg via INTRAVENOUS
  Filled 2016-08-02 (×2): qty 10

## 2016-08-03 ENCOUNTER — Ambulatory Visit (HOSPITAL_COMMUNITY): Payer: Medicare Other | Admitting: Certified Registered Nurse Anesthetist

## 2016-08-03 ENCOUNTER — Encounter (HOSPITAL_COMMUNITY): Payer: Self-pay | Admitting: *Deleted

## 2016-08-03 ENCOUNTER — Encounter (HOSPITAL_COMMUNITY)
Admission: RE | Disposition: A | Discharge status: Home Health | Payer: Self-pay | Source: Ambulatory Visit | Attending: Orthopaedic Surgery

## 2016-08-03 ENCOUNTER — Observation Stay (HOSPITAL_COMMUNITY)
Admission: RE | Admit: 2016-08-03 | Discharge: 2016-08-04 | Disposition: A | Discharge status: Home Health | Payer: Medicare Other | Source: Ambulatory Visit | Attending: Orthopaedic Surgery | Admitting: Orthopaedic Surgery

## 2016-08-03 ENCOUNTER — Observation Stay (HOSPITAL_COMMUNITY): Payer: Medicare Other

## 2016-08-03 ENCOUNTER — Ambulatory Visit (HOSPITAL_COMMUNITY): Payer: Medicare Other | Admitting: Emergency Medicine

## 2016-08-03 DIAGNOSIS — Z6841 Body Mass Index (BMI) 40.0 and over, adult: Secondary | ICD-10-CM | POA: Insufficient documentation

## 2016-08-03 DIAGNOSIS — F419 Anxiety disorder, unspecified: Secondary | ICD-10-CM | POA: Diagnosis not present

## 2016-08-03 DIAGNOSIS — D62 Acute posthemorrhagic anemia: Secondary | ICD-10-CM | POA: Insufficient documentation

## 2016-08-03 DIAGNOSIS — M1712 Unilateral primary osteoarthritis, left knee: Secondary | ICD-10-CM | POA: Diagnosis not present

## 2016-08-03 DIAGNOSIS — Z96652 Presence of left artificial knee joint: Secondary | ICD-10-CM

## 2016-08-03 DIAGNOSIS — F17210 Nicotine dependence, cigarettes, uncomplicated: Secondary | ICD-10-CM | POA: Diagnosis not present

## 2016-08-03 DIAGNOSIS — K219 Gastro-esophageal reflux disease without esophagitis: Secondary | ICD-10-CM | POA: Diagnosis not present

## 2016-08-03 DIAGNOSIS — G43909 Migraine, unspecified, not intractable, without status migrainosus: Secondary | ICD-10-CM | POA: Diagnosis not present

## 2016-08-03 DIAGNOSIS — Z7984 Long term (current) use of oral hypoglycemic drugs: Secondary | ICD-10-CM | POA: Insufficient documentation

## 2016-08-03 DIAGNOSIS — G35 Multiple sclerosis: Secondary | ICD-10-CM | POA: Insufficient documentation

## 2016-08-03 DIAGNOSIS — F418 Other specified anxiety disorders: Secondary | ICD-10-CM | POA: Diagnosis not present

## 2016-08-03 DIAGNOSIS — M797 Fibromyalgia: Secondary | ICD-10-CM | POA: Insufficient documentation

## 2016-08-03 DIAGNOSIS — Z86718 Personal history of other venous thrombosis and embolism: Secondary | ICD-10-CM | POA: Insufficient documentation

## 2016-08-03 DIAGNOSIS — F329 Major depressive disorder, single episode, unspecified: Secondary | ICD-10-CM | POA: Insufficient documentation

## 2016-08-03 DIAGNOSIS — G8918 Other acute postprocedural pain: Secondary | ICD-10-CM | POA: Diagnosis not present

## 2016-08-03 DIAGNOSIS — Z471 Aftercare following joint replacement surgery: Secondary | ICD-10-CM | POA: Diagnosis not present

## 2016-08-03 DIAGNOSIS — Z79899 Other long term (current) drug therapy: Secondary | ICD-10-CM | POA: Insufficient documentation

## 2016-08-03 DIAGNOSIS — Z8673 Personal history of transient ischemic attack (TIA), and cerebral infarction without residual deficits: Secondary | ICD-10-CM | POA: Insufficient documentation

## 2016-08-03 DIAGNOSIS — I10 Essential (primary) hypertension: Secondary | ICD-10-CM | POA: Diagnosis not present

## 2016-08-03 HISTORY — PX: PARTIAL KNEE ARTHROPLASTY: SHX2174

## 2016-08-03 SURGERY — ARTHROPLASTY, KNEE, UNICOMPARTMENTAL
Anesthesia: General | Site: Knee | Laterality: Left

## 2016-08-03 MED ORDER — 0.9 % SODIUM CHLORIDE (POUR BTL) OPTIME
TOPICAL | Status: DC | PRN
  Administered 2016-08-03: 1000 mL

## 2016-08-03 MED ORDER — ACETAMINOPHEN 650 MG RE SUPP: 650.0000 mg | Freq: Four times a day (QID) | RECTAL | Status: DC | PRN

## 2016-08-03 MED ORDER — MIDAZOLAM HCL 2 MG/2ML IJ SOLN
2.0000 mg | Freq: Once | INTRAMUSCULAR | Status: AC
  Administered 2016-08-03: 2 mg via INTRAVENOUS

## 2016-08-03 MED ORDER — SODIUM CHLORIDE 0.9 % IJ SOLN
INTRAMUSCULAR | Status: DC | PRN
  Administered 2016-08-03: 40 mL

## 2016-08-03 MED ORDER — GABAPENTIN 300 MG PO CAPS
ORAL_CAPSULE | ORAL | Status: AC
  Administered 2016-08-03: 1200 mg via ORAL
  Filled 2016-08-03: qty 4

## 2016-08-03 MED ORDER — ONDANSETRON HCL 4 MG/2ML IJ SOLN
INTRAMUSCULAR | Status: DC | PRN
  Administered 2016-08-03: 4 mg via INTRAVENOUS

## 2016-08-03 MED ORDER — TOPIRAMATE 100 MG PO TABS
100.0000 mg | ORAL_TABLET | Freq: Every morning | ORAL | Status: DC
  Administered 2016-08-04: 100 mg via ORAL
  Filled 2016-08-03: qty 1

## 2016-08-03 MED ORDER — MIDAZOLAM HCL 2 MG/2ML IJ SOLN
INTRAMUSCULAR | Status: AC
  Administered 2016-08-03: 2 mg via INTRAVENOUS
  Filled 2016-08-03: qty 2

## 2016-08-03 MED ORDER — OXYCODONE HCL 5 MG PO TABS
ORAL_TABLET | ORAL | Status: AC
  Filled 2016-08-03: qty 3

## 2016-08-03 MED ORDER — OXYCODONE HCL ER 10 MG PO T12A: 10.0000 mg | EXTENDED_RELEASE_TABLET | Freq: Two times a day (BID) | ORAL | 0 refills | Status: DC

## 2016-08-03 MED ORDER — SODIUM CHLORIDE 0.9 % IR SOLN
Status: DC | PRN
  Administered 2016-08-03: 3000 mL

## 2016-08-03 MED ORDER — CYCLOBENZAPRINE HCL 10 MG PO TABS: 10.0000 mg | ORAL_TABLET | Freq: Every evening | ORAL | Status: DC | PRN

## 2016-08-03 MED ORDER — FENTANYL CITRATE (PF) 100 MCG/2ML IJ SOLN
INTRAMUSCULAR | Status: AC
  Administered 2016-08-03: 100 ug via INTRAVENOUS
  Filled 2016-08-03: qty 2

## 2016-08-03 MED ORDER — DEXAMETHASONE SODIUM PHOSPHATE 10 MG/ML IJ SOLN
10.0000 mg | Freq: Once | INTRAMUSCULAR | Status: AC
  Administered 2016-08-04: 10 mg via INTRAVENOUS
  Filled 2016-08-03: qty 1

## 2016-08-03 MED ORDER — MAGNESIUM CITRATE PO SOLN: 1.0000 | Freq: Once | ORAL | Status: DC | PRN

## 2016-08-03 MED ORDER — SENNOSIDES-DOCUSATE SODIUM 8.6-50 MG PO TABS: 1.0000 | ORAL_TABLET | Freq: Every evening | ORAL | 1 refills | Status: DC | PRN

## 2016-08-03 MED ORDER — HYDROMORPHONE HCL 1 MG/ML IJ SOLN
0.2500 mg | INTRAMUSCULAR | Status: DC | PRN
  Administered 2016-08-03 (×4): 0.5 mg via INTRAVENOUS

## 2016-08-03 MED ORDER — ACETAMINOPHEN 500 MG PO TABS: 1000.0000 mg | ORAL_TABLET | Freq: Four times a day (QID) | ORAL | Status: DC | PRN

## 2016-08-03 MED ORDER — SODIUM CHLORIDE 0.9 % IV SOLN
1000.0000 mg | Freq: Once | INTRAVENOUS | Status: AC
  Administered 2016-08-03: 1000 mg via INTRAVENOUS
  Filled 2016-08-03: qty 10

## 2016-08-03 MED ORDER — ALUM & MAG HYDROXIDE-SIMETH 200-200-20 MG/5ML PO SUSP: 30.0000 mL | ORAL | Status: DC | PRN

## 2016-08-03 MED ORDER — HYDROCHLOROTHIAZIDE 25 MG PO TABS: 25.0000 mg | ORAL_TABLET | Freq: Every day | ORAL | Status: DC | PRN

## 2016-08-03 MED ORDER — METHOCARBAMOL 500 MG PO TABS
500.0000 mg | ORAL_TABLET | Freq: Four times a day (QID) | ORAL | Status: DC | PRN
  Administered 2016-08-03 – 2016-08-04 (×2): 500 mg via ORAL
  Filled 2016-08-03: qty 1

## 2016-08-03 MED ORDER — METHOCARBAMOL 500 MG PO TABS
ORAL_TABLET | ORAL | Status: AC
  Filled 2016-08-03: qty 1

## 2016-08-03 MED ORDER — ARIPIPRAZOLE 10 MG PO TABS
10.0000 mg | ORAL_TABLET | Freq: Every day | ORAL | Status: DC
  Administered 2016-08-04: 10 mg via ORAL
  Filled 2016-08-03: qty 2
  Filled 2016-08-03: qty 1
  Filled 2016-08-03: qty 2
  Filled 2016-08-03: qty 1

## 2016-08-03 MED ORDER — ASPIRIN EC 325 MG PO TBEC: 325.0000 mg | DELAYED_RELEASE_TABLET | Freq: Two times a day (BID) | ORAL | Status: DC

## 2016-08-03 MED ORDER — ROPIVACAINE HCL 7.5 MG/ML IJ SOLN
INTRAMUSCULAR | Status: DC | PRN
  Administered 2016-08-03: 20 mL via PERINEURAL

## 2016-08-03 MED ORDER — PROPOFOL 10 MG/ML IV BOLUS
INTRAVENOUS | Status: DC | PRN
  Administered 2016-08-03: 200 mg via INTRAVENOUS

## 2016-08-03 MED ORDER — ACETAMINOPHEN 500 MG PO TABS
ORAL_TABLET | ORAL | Status: AC
  Administered 2016-08-03: 1000 mg via ORAL
  Filled 2016-08-03: qty 2

## 2016-08-03 MED ORDER — OXYCODONE HCL 5 MG PO TABS: 5.0000 mg | ORAL_TABLET | ORAL | 0 refills | Status: DC | PRN

## 2016-08-03 MED ORDER — ONDANSETRON HCL 4 MG PO TABS: 4.0000 mg | ORAL_TABLET | Freq: Three times a day (TID) | ORAL | 0 refills | Status: DC | PRN

## 2016-08-03 MED ORDER — LACTATED RINGERS IV SOLN
INTRAVENOUS | Status: DC
  Administered 2016-08-03 (×2): via INTRAVENOUS

## 2016-08-03 MED ORDER — HYDROMORPHONE HCL 1 MG/ML IJ SOLN
INTRAMUSCULAR | Status: AC
  Administered 2016-08-03: 0.5 mg via INTRAVENOUS
  Filled 2016-08-03: qty 1

## 2016-08-03 MED ORDER — FENTANYL CITRATE (PF) 100 MCG/2ML IJ SOLN
INTRAMUSCULAR | Status: AC
  Filled 2016-08-03: qty 4

## 2016-08-03 MED ORDER — METHOCARBAMOL 1000 MG/10ML IJ SOLN
500.0000 mg | Freq: Four times a day (QID) | INTRAVENOUS | Status: DC | PRN
  Filled 2016-08-03: qty 5

## 2016-08-03 MED ORDER — ACETAMINOPHEN 500 MG PO TABS
1000.0000 mg | ORAL_TABLET | Freq: Once | ORAL | Status: AC
  Administered 2016-08-03: 1000 mg via ORAL

## 2016-08-03 MED ORDER — CEFAZOLIN SODIUM-DEXTROSE 2-4 GM/100ML-% IV SOLN
2.0000 g | Freq: Four times a day (QID) | INTRAVENOUS | Status: AC
Start: 2016-08-03 — End: 2016-08-04
  Administered 2016-08-03 – 2016-08-04 (×2): 2 g via INTRAVENOUS
  Filled 2016-08-03 (×2): qty 100

## 2016-08-03 MED ORDER — DULOXETINE HCL 60 MG PO CPEP
60.0000 mg | ORAL_CAPSULE | Freq: Every day | ORAL | Status: DC
  Administered 2016-08-04: 60 mg via ORAL
  Filled 2016-08-03: qty 1

## 2016-08-03 MED ORDER — HYDROMORPHONE HCL 1 MG/ML IJ SOLN
INTRAMUSCULAR | Status: AC
  Filled 2016-08-03: qty 0.5

## 2016-08-03 MED ORDER — SODIUM CHLORIDE 0.9 % IV SOLN
INTRAVENOUS | Status: DC
  Administered 2016-08-03: 21:00:00 via INTRAVENOUS

## 2016-08-03 MED ORDER — POLYETHYLENE GLYCOL 3350 17 G PO PACK: 17.0000 g | PACK | Freq: Every day | ORAL | Status: DC | PRN

## 2016-08-03 MED ORDER — MENTHOL 3 MG MT LOZG: 1.0000 | LOZENGE | OROMUCOSAL | Status: DC | PRN

## 2016-08-03 MED ORDER — TOPIRAMATE 100 MG PO TABS: 100.0000 mg | ORAL_TABLET | Freq: Two times a day (BID) | ORAL | Status: DC

## 2016-08-03 MED ORDER — ASPIRIN EC 325 MG PO TBEC: 325.0000 mg | DELAYED_RELEASE_TABLET | Freq: Two times a day (BID) | ORAL | 0 refills | Status: DC

## 2016-08-03 MED ORDER — ACETAMINOPHEN 500 MG PO TABS
1000.0000 mg | ORAL_TABLET | Freq: Four times a day (QID) | ORAL | Status: DC
  Administered 2016-08-03 – 2016-08-04 (×2): 1000 mg via ORAL
  Administered 2016-08-04: 500 mg via ORAL
  Filled 2016-08-03 (×3): qty 2

## 2016-08-03 MED ORDER — SUMATRIPTAN SUCCINATE 100 MG PO TABS
100.0000 mg | ORAL_TABLET | ORAL | Status: DC | PRN
Start: 2016-08-03 — End: 2016-08-04
  Filled 2016-08-03: qty 1

## 2016-08-03 MED ORDER — HYDROCODONE-ACETAMINOPHEN 7.5-325 MG PO TABS
1.0000 | ORAL_TABLET | Freq: Three times a day (TID) | ORAL | Status: DC | PRN
  Administered 2016-08-04: 1 via ORAL
  Filled 2016-08-03: qty 1

## 2016-08-03 MED ORDER — FENTANYL CITRATE (PF) 100 MCG/2ML IJ SOLN
INTRAMUSCULAR | Status: DC | PRN
  Administered 2016-08-03 (×4): 25 ug via INTRAVENOUS

## 2016-08-03 MED ORDER — CHLORHEXIDINE GLUCONATE 4 % EX LIQD: 60.0000 mL | Freq: Once | CUTANEOUS | Status: DC

## 2016-08-03 MED ORDER — ONDANSETRON HCL 4 MG PO TABS: 4.0000 mg | ORAL_TABLET | Freq: Four times a day (QID) | ORAL | Status: DC | PRN

## 2016-08-03 MED ORDER — TOPIRAMATE 100 MG PO TABS
200.0000 mg | ORAL_TABLET | Freq: Every day | ORAL | Status: DC
  Administered 2016-08-03: 200 mg via ORAL
  Filled 2016-08-03: qty 2

## 2016-08-03 MED ORDER — DIPHENHYDRAMINE HCL 12.5 MG/5ML PO ELIX: 25.0000 mg | ORAL_SOLUTION | ORAL | Status: DC | PRN

## 2016-08-03 MED ORDER — SORBITOL 70 % SOLN: 30.0000 mL | Freq: Every day | Status: DC | PRN

## 2016-08-03 MED ORDER — GABAPENTIN 400 MG PO CAPS
1200.0000 mg | ORAL_CAPSULE | Freq: Three times a day (TID) | ORAL | Status: DC
  Administered 2016-08-03 – 2016-08-04 (×2): 1200 mg via ORAL
  Filled 2016-08-03 (×2): qty 3

## 2016-08-03 MED ORDER — BUPIVACAINE LIPOSOME 1.3 % IJ SUSP
20.0000 mL | INTRAMUSCULAR | Status: DC
  Filled 2016-08-03: qty 20

## 2016-08-03 MED ORDER — TRANEXAMIC ACID 1000 MG/10ML IV SOLN
INTRAVENOUS | Status: DC | PRN
  Administered 2016-08-03: 2000 mg via TOPICAL

## 2016-08-03 MED ORDER — BUPIVACAINE LIPOSOME 1.3 % IJ SUSP
INTRAMUSCULAR | Status: DC | PRN
  Administered 2016-08-03: 20 mL

## 2016-08-03 MED ORDER — TRANEXAMIC ACID 1000 MG/10ML IV SOLN
2000.0000 mg | INTRAVENOUS | Status: DC
  Filled 2016-08-03: qty 20

## 2016-08-03 MED ORDER — DEXAMETHASONE SODIUM PHOSPHATE 10 MG/ML IJ SOLN
INTRAMUSCULAR | Status: DC | PRN
  Administered 2016-08-03: 10 mg via INTRAVENOUS

## 2016-08-03 MED ORDER — METHOCARBAMOL 750 MG PO TABS: 750.0000 mg | ORAL_TABLET | Freq: Two times a day (BID) | ORAL | 0 refills | Status: DC | PRN

## 2016-08-03 MED ORDER — CLONAZEPAM 1 MG PO TABS: 1.0000 mg | ORAL_TABLET | Freq: Three times a day (TID) | ORAL | Status: DC | PRN

## 2016-08-03 MED ORDER — GABAPENTIN 300 MG PO CAPS
1200.0000 mg | ORAL_CAPSULE | Freq: Once | ORAL | Status: AC
  Administered 2016-08-03: 1200 mg via ORAL

## 2016-08-03 MED ORDER — ONDANSETRON HCL 4 MG/2ML IJ SOLN
INTRAMUSCULAR | Status: AC
  Filled 2016-08-03: qty 2

## 2016-08-03 MED ORDER — ONDANSETRON HCL 4 MG/2ML IJ SOLN
4.0000 mg | Freq: Four times a day (QID) | INTRAMUSCULAR | Status: DC | PRN
  Administered 2016-08-03: 4 mg via INTRAVENOUS
  Filled 2016-08-03: qty 2

## 2016-08-03 MED ORDER — LIDOCAINE HCL (CARDIAC) 20 MG/ML IV SOLN
INTRAVENOUS | Status: DC | PRN
  Administered 2016-08-03: 80 mg via INTRAVENOUS

## 2016-08-03 MED ORDER — OXYCODONE HCL 5 MG PO TABS
5.0000 mg | ORAL_TABLET | ORAL | Status: DC | PRN
  Administered 2016-08-03: 15 mg via ORAL
  Administered 2016-08-04: 10 mg via ORAL
  Administered 2016-08-04: 15 mg via ORAL
  Administered 2016-08-04: 10 mg via ORAL
  Filled 2016-08-03 (×2): qty 2
  Filled 2016-08-03: qty 3

## 2016-08-03 MED ORDER — METFORMIN HCL 500 MG PO TABS
500.0000 mg | ORAL_TABLET | Freq: Two times a day (BID) | ORAL | Status: DC
  Administered 2016-08-04: 500 mg via ORAL
  Filled 2016-08-03: qty 1

## 2016-08-03 MED ORDER — KETOROLAC TROMETHAMINE 15 MG/ML IJ SOLN
30.0000 mg | Freq: Four times a day (QID) | INTRAMUSCULAR | Status: AC
  Administered 2016-08-03 – 2016-08-04 (×4): 30 mg via INTRAVENOUS
  Filled 2016-08-03 (×5): qty 2

## 2016-08-03 MED ORDER — FENTANYL CITRATE (PF) 100 MCG/2ML IJ SOLN
100.0000 ug | Freq: Once | INTRAMUSCULAR | Status: AC
  Administered 2016-08-03: 100 ug via INTRAVENOUS

## 2016-08-03 MED ORDER — PHENOL 1.4 % MT LIQD
1.0000 | OROMUCOSAL | Status: DC | PRN
Start: 2016-08-03 — End: 2016-08-04

## 2016-08-03 MED ORDER — CEFAZOLIN SODIUM-DEXTROSE 2-4 GM/100ML-% IV SOLN
2.0000 g | INTRAVENOUS | Status: AC
  Administered 2016-08-03: 2 g via INTRAVENOUS
  Filled 2016-08-03: qty 100

## 2016-08-03 MED ORDER — MORPHINE SULFATE (PF) 2 MG/ML IV SOLN: 1.0000 mg | INTRAVENOUS | Status: DC | PRN

## 2016-08-03 MED ORDER — PROMETHAZINE HCL 25 MG PO TABS: 25.0000 mg | ORAL_TABLET | Freq: Four times a day (QID) | ORAL | 1 refills | Status: DC | PRN

## 2016-08-03 MED ORDER — ACETAMINOPHEN 325 MG PO TABS: 650.0000 mg | ORAL_TABLET | Freq: Four times a day (QID) | ORAL | Status: DC | PRN

## 2016-08-03 MED ORDER — GLYCOPYRROLATE 0.2 MG/ML IJ SOLN
INTRAMUSCULAR | Status: DC | PRN
  Administered 2016-08-03: 0.4 mg via INTRAVENOUS
  Administered 2016-08-03: 0.1 mg via INTRAVENOUS

## 2016-08-03 MED ORDER — METOCLOPRAMIDE HCL 5 MG PO TABS: 5.0000 mg | ORAL_TABLET | Freq: Three times a day (TID) | ORAL | Status: DC | PRN

## 2016-08-03 MED ORDER — LIDOCAINE 2% (20 MG/ML) 5 ML SYRINGE
INTRAMUSCULAR | Status: AC
  Filled 2016-08-03: qty 5

## 2016-08-03 MED ORDER — METOCLOPRAMIDE HCL 5 MG/ML IJ SOLN: 5.0000 mg | Freq: Three times a day (TID) | INTRAMUSCULAR | Status: DC | PRN

## 2016-08-03 MED ORDER — OXYCODONE HCL ER 10 MG PO T12A
10.0000 mg | EXTENDED_RELEASE_TABLET | Freq: Two times a day (BID) | ORAL | Status: DC
  Administered 2016-08-03 – 2016-08-04 (×2): 10 mg via ORAL
  Filled 2016-08-03 (×2): qty 1

## 2016-08-03 SURGICAL SUPPLY — 64 items
ALCOHOL ISOPROPYL (RUBBING) (MISCELLANEOUS) ×2 IMPLANT
BAG DECANTER FOR FLEXI CONT (MISCELLANEOUS) ×4 IMPLANT
BANDAGE ACE 6X5 VEL STRL LF (GAUZE/BANDAGES/DRESSINGS) ×4 IMPLANT
BANDAGE ESMARK 6X9 LF (GAUZE/BANDAGES/DRESSINGS) ×1 IMPLANT
BENZOIN TINCTURE PRP APPL 2/3 (GAUZE/BANDAGES/DRESSINGS) ×2 IMPLANT
BLADE SAW SGTL 13.0X1.19X90.0M (BLADE) ×2 IMPLANT
BNDG ELASTIC 6X15 VLCR STRL LF (GAUZE/BANDAGES/DRESSINGS) ×2 IMPLANT
BNDG ESMARK 6X9 LF (GAUZE/BANDAGES/DRESSINGS) ×2
BOWL SMART MIX CTS (DISPOSABLE) ×2 IMPLANT
CAPT KNEE PARTIAL 2 ×2 IMPLANT
CLSR STERI-STRIP ANTIMIC 1/2X4 (GAUZE/BANDAGES/DRESSINGS) ×2 IMPLANT
COVER SURGICAL LIGHT HANDLE (MISCELLANEOUS) ×2 IMPLANT
CUFF TOURNIQUET SINGLE 34IN LL (TOURNIQUET CUFF) ×2 IMPLANT
CUFF TOURNIQUET SINGLE 44IN (TOURNIQUET CUFF) IMPLANT
DRAPE EXTREMITY T 121X128X90 (DRAPE) ×2 IMPLANT
DRAPE INCISE IOBAN 66X45 STRL (DRAPES) IMPLANT
DRAPE ORTHO SPLIT 77X108 STRL (DRAPES) ×2
DRAPE PROXIMA HALF (DRAPES) ×2 IMPLANT
DRAPE SURG 17X11 SM STRL (DRAPES) ×4 IMPLANT
DRAPE SURG ORHT 6 SPLT 77X108 (DRAPES) ×2 IMPLANT
DRSG AQUACEL AG ADV 3.5X10 (GAUZE/BANDAGES/DRESSINGS) ×2 IMPLANT
DRSG AQUACEL AG ADV 3.5X14 (GAUZE/BANDAGES/DRESSINGS) ×2 IMPLANT
DURAPREP 26ML APPLICATOR (WOUND CARE) ×4 IMPLANT
ELECT CAUTERY BLADE 6.4 (BLADE) ×2 IMPLANT
ELECT REM PT RETURN 9FT ADLT (ELECTROSURGICAL) ×2
ELECTRODE REM PT RTRN 9FT ADLT (ELECTROSURGICAL) ×1 IMPLANT
GLOVE SKINSENSE NS SZ7.5 (GLOVE) ×1
GLOVE SKINSENSE STRL SZ7.5 (GLOVE) ×1 IMPLANT
GLOVE SURG SYN 7.5  E (GLOVE) ×4
GLOVE SURG SYN 7.5 E (GLOVE) ×4 IMPLANT
GOWN STRL REIN XL XLG (GOWN DISPOSABLE) ×2 IMPLANT
GOWN STRL REUS W/ TWL LRG LVL3 (GOWN DISPOSABLE) ×1 IMPLANT
GOWN STRL REUS W/TWL LRG LVL3 (GOWN DISPOSABLE) ×1
HANDPIECE INTERPULSE COAX TIP (DISPOSABLE) ×2
HOOD PEEL AWAY FLYTE STAYCOOL (MISCELLANEOUS) ×4 IMPLANT
KIT BASIN OR (CUSTOM PROCEDURE TRAY) ×2 IMPLANT
KIT ROOM TURNOVER OR (KITS) ×2 IMPLANT
MANIFOLD NEPTUNE II (INSTRUMENTS) ×2 IMPLANT
MARKER SKIN DUAL TIP RULER LAB (MISCELLANEOUS) ×2 IMPLANT
NEEDLE SPNL 18GX3.5 QUINCKE PK (NEEDLE) ×2 IMPLANT
NS IRRIG 1000ML POUR BTL (IV SOLUTION) ×2 IMPLANT
PACK BLADE SAW RECIP 70 3 PT (BLADE) ×2 IMPLANT
PACK TOTAL JOINT (CUSTOM PROCEDURE TRAY) ×2 IMPLANT
PAD ARMBOARD 7.5X6 YLW CONV (MISCELLANEOUS) ×4 IMPLANT
PADDING CAST COTTON 6X4 STRL (CAST SUPPLIES) ×4 IMPLANT
SAW OSC TIP CART 19.5X105X1.3 (SAW) ×2 IMPLANT
SET HNDPC FAN SPRY TIP SCT (DISPOSABLE) ×2 IMPLANT
STAPLER VISISTAT 35W (STAPLE) IMPLANT
SUCTION FRAZIER HANDLE 10FR (MISCELLANEOUS) ×1
SUCTION TUBE FRAZIER 10FR DISP (MISCELLANEOUS) ×1 IMPLANT
SUT ETHILON 2 0 FS 18 (SUTURE) IMPLANT
SUT MNCRL AB 3-0 PS2 18 (SUTURE) ×2 IMPLANT
SUT MNCRL AB 4-0 PS2 18 (SUTURE) IMPLANT
SUT VIC AB 0 CT1 27 (SUTURE) ×2
SUT VIC AB 0 CT1 27XBRD ANBCTR (SUTURE) ×2 IMPLANT
SUT VIC AB 1 CTX 27 (SUTURE) ×6 IMPLANT
SUT VIC AB 2-0 CT1 27 (SUTURE) ×3
SUT VIC AB 2-0 CT1 TAPERPNT 27 (SUTURE) ×3 IMPLANT
SYR 50ML LL SCALE MARK (SYRINGE) ×2 IMPLANT
TOWEL OR 17X24 6PK STRL BLUE (TOWEL DISPOSABLE) ×2 IMPLANT
TOWEL OR 17X26 10 PK STRL BLUE (TOWEL DISPOSABLE) ×2 IMPLANT
TRAY CATH 16FR W/PLASTIC CATH (SET/KITS/TRAYS/PACK) IMPLANT
UNDERPAD 30X30 (UNDERPADS AND DIAPERS) ×2 IMPLANT
WRAP KNEE MAXI GEL POST OP (GAUZE/BANDAGES/DRESSINGS) ×2 IMPLANT

## 2016-08-03 NOTE — Anesthesia Postprocedure Evaluation (Signed)
Anesthesia Post Note  Patient: Lotoya Casella Przybysz  Procedure(s) Performed: Procedure(s) (LRB): LEFT UNICOMPARTMENTAL KNEE ARTHROPLASTY (Left)  Patient location during evaluation: PACU Anesthesia Type: General and Regional Level of consciousness: awake and alert Pain management: pain level controlled Vital Signs Assessment: post-procedure vital signs reviewed and stable Respiratory status: spontaneous breathing, nonlabored ventilation, respiratory function stable and patient connected to nasal cannula oxygen Cardiovascular status: blood pressure returned to baseline and stable Postop Assessment: no signs of nausea or vomiting Anesthetic complications: no       Last Vitals:  Vitals:   08/03/16 2000 08/03/16 2023  BP: 127/73 113/76  Pulse: 70 84  Resp: 13 14  Temp: 36.6 C 36.7 C    Last Pain:  Vitals:   08/03/16 1958  TempSrc:   PainSc: 3                  Damon Baisch,W. EDMOND

## 2016-08-03 NOTE — Anesthesia Procedure Notes (Signed)
Anesthesia Regional Block: Adductor canal block   Pre-Anesthetic Checklist: ,, timeout performed, Correct Patient, Correct Site, Correct Laterality, Correct Procedure, Correct Position, site marked, Risks and benefits discussed, pre-op evaluation,  At surgeon's request and post-op pain management  Laterality: Left  Prep: Maximum Sterile Barrier Precautions used, chloraprep       Needles:  Injection technique: Single-shot  Needle Type: Echogenic Stimulator Needle     Needle Length: 9cm  Needle Gauge: 21     Additional Needles:   Procedures: ultrasound guided,,,,,,,,  Narrative:  Start time: 08/03/2016 2:06 PM End time: 08/03/2016 2:16 PM Injection made incrementally with aspirations every 5 mL. Anesthesiologist: Roderic Palau  Additional Notes: 2% Lidocaine skin wheel.

## 2016-08-03 NOTE — Transfer of Care (Signed)
Immediate Anesthesia Transfer of Care Note  Patient: Ashley Gomez  Procedure(s) Performed: Procedure(s): LEFT UNICOMPARTMENTAL KNEE ARTHROPLASTY (Left)  Patient Location: PACU  Anesthesia Type:General  Level of Consciousness: awake, alert , oriented and patient cooperative  Airway & Oxygen Therapy: Patient Spontanous Breathing and Patient connected to nasal cannula oxygen  Post-op Assessment: Report given to RN and Post -op Vital signs reviewed and stable  Post vital signs: Reviewed  Last Vitals: 124/68, 81, 18, 96% 3LNC Vitals:   08/03/16 1315  BP: (!) 137/92  Pulse: (!) 101  Resp: 20  Temp: 36.7 C    Last Pain:  Vitals:   08/03/16 1324  TempSrc:   PainSc: 6          Complications: No apparent anesthesia complications

## 2016-08-03 NOTE — Anesthesia Procedure Notes (Signed)
Procedure Name: LMA Insertion Date/Time: 08/03/2016 3:38 PM Performed by: Valda Favia Pre-anesthesia Checklist: Patient identified Patient Re-evaluated:Patient Re-evaluated prior to inductionOxygen Delivery Method: Circle system utilized Preoxygenation: Pre-oxygenation with 100% oxygen Intubation Type: IV induction LMA: LMA inserted LMA Size: 4.0 Number of attempts: 1 Placement Confirmation: positive ETCO2 and breath sounds checked- equal and bilateral Tube secured with: Tape Dental Injury: Teeth and Oropharynx as per pre-operative assessment

## 2016-08-03 NOTE — Anesthesia Preprocedure Evaluation (Addendum)
Anesthesia Evaluation  Patient identified by MRN, date of birth, ID band Patient awake    Reviewed: Allergy & Precautions, H&P , NPO status , Patient's Chart, lab work & pertinent test results  Airway Mallampati: II  TM Distance: >3 FB Neck ROM: Full    Dental no notable dental hx. (+) Teeth Intact, Dental Advisory Given   Pulmonary sleep apnea , Current Smoker,    Pulmonary exam normal breath sounds clear to auscultation       Cardiovascular hypertension, Pt. on medications + Peripheral Vascular Disease   Rhythm:Regular Rate:Normal     Neuro/Psych  Headaches, Seizures -, Well Controlled,  Anxiety Depression MS CVA, No Residual Symptoms negative psych ROS   GI/Hepatic Neg liver ROS, GERD  Medicated and Controlled,  Endo/Other  Morbid obesity  Renal/GU negative Renal ROS  negative genitourinary   Musculoskeletal  (+) Arthritis ,   Abdominal   Peds  Hematology negative hematology ROS (+)   Anesthesia Other Findings   Reproductive/Obstetrics negative OB ROS                            Anesthesia Physical Anesthesia Plan  ASA: III  Anesthesia Plan: General   Post-op Pain Management:  Regional for Post-op pain   Induction: Intravenous  Airway Management Planned: LMA  Additional Equipment:   Intra-op Plan:   Post-operative Plan: Extubation in OR  Informed Consent: I have reviewed the patients History and Physical, chart, labs and discussed the procedure including the risks, benefits and alternatives for the proposed anesthesia with the patient or authorized representative who has indicated his/her understanding and acceptance.   Dental advisory given  Plan Discussed with: CRNA  Anesthesia Plan Comments:         Anesthesia Quick Evaluation

## 2016-08-03 NOTE — Op Note (Signed)
Date of Surgery: 08/03/2016  INDICATIONS: The patient is a 46 year old female who presents with left knee medial osteoarthritis;  The patient did consent to the procedure after discussion of the risks and benefits.  PREOPERATIVE DIAGNOSIS: Left knee medial osteoarthritis  POSTOPERATIVE DIAGNOSIS: Same.  PROCEDURE: Left unicompartmental knee arthroplasty  SURGEON: N. Eduard Roux, M.D.  ASSIST: Laure Kidney, RNFA.  ANESTHESIA:  general, regional  IV FLUIDS AND URINE: See anesthesia.  ESTIMATED BLOOD LOSS: Minimal mL.  IMPLANTS: Biomet Oxford Femur: Medium Tibia: A Poly: 3 mm  DRAINS: none  COMPLICATIONS: None.  DESCRIPTION OF PROCEDURE: The patient was brought to the operating room and placed supine on the operating table.  The patient had been signed prior to the procedure and this was documented. The patient had the anesthesia placed by the anesthesiologist.  A time-out was performed to confirm that this was the correct patient, site, side and location. The patient had an SCD on the opposite lower extremity. A nonsterile tourniquet was placed on the upper right thigh and the leg was placed in the legholder. The patient did receive antibiotics prior to the incision and was re-dosed during the procedure as needed at indicated intervals.  The patient had the operative extremity prepped and draped in the standard surgical fashion.    The extremity was elevated for exsanguination and the tourniquet was inflated to 350 mmHg. We use a longitudinal incision from the medial border of the patella down to the tibial tubercle. Blunt dissection was taken down to the level of the peritenon. A limited medial parapatellar arthrotomy was created. The infrapatellar fat pad was removed. No soft tissue releases were performed. We removed what we could see of the medial meniscus. We then sized a medium spoon to the patient. We placed the tibial cutting guide to the spoon and clamped it in place. The  guide was placed parallel to the anterior cortex of the tibia and in line with the tibia. We took off the osteophytes from the medial femoral condyle. We then created our sagittal cut just medial to the downslope of the medial tibial eminence. We then protected the MCL with the retractor and completed our transverse tibial cut. The cut tibia was taken out and sized to a size A tibia. The tibia did exhibit classic anterior medial wear pattern. Once this was done we then placed an intramedullary rod down the femur and linked up the femoral cutting guide. With the cutting guide in place we were able to visualize the line we drew down the center of the condyle. After the appropriate holes were drilled we then prepared the femur with the miller. Once this was done we then made our posterior femoral condyle cut. Once this was done we then placed trial components in to assess our gaps. We determined that we needed to take an extra 3 mm from the distal femur in order to balance our gaps. With the gaps balanced we then prepared the rest of the femur and the tibia. We took out the rest of the medial meniscus.  We then began with cement mixing and while this was done we prepared the bony surfaces and we thoroughly irrigated the bony surfaces.  We injected a mixture of 20 mL of external and 40 mL of saline in the posterior capsule and the medial retinaculum.  We then cemented our final implants.  Care was taken to remove all excess cement. We then allowed the cement to dry. After this was done  we then placed the final polyethylene liner in. There was excellent tracking of the implant.  We then closed the arthrotomy with interrupted #1 Vicryl sutures, the subcutaneous layer with interrupted 2-0 Vicryl, the skin with interrupted 2-0 nylon. Sterile dressings were applied. The patient was extubated and transferred to the PACU in stable condition. All sponge counts were correct.  POSTOPERATIVE PLAN: The patient will be admitted  overnight for observation and pain control. The patient will be weightbearing as tolerated. Anticipate being able to discharge the patient in the morning.  Azucena Cecil, MD [facility] [phone] 5:11 PM

## 2016-08-03 NOTE — H&P (Signed)
PREOPERATIVE H&P  Chief Complaint: left knee osteoarthritis  HPI: Ashley Gomez is a 46 y.o. female who presents for surgical treatment of left knee osteoarthritis.  She denies any changes in medical history.  Past Medical History:  Diagnosis Date  . Anxiety   . Arthritis   . CVA (cerebral infarction)    Old CVA on MRI brain  . Depression   . DVT (deep venous thrombosis) ([place]) 44/07/4740   L basilic vein   . Facet hypertrophy of lumbar region    MRI 2007  . Fibromyalgia   . GERD (gastroesophageal reflux disease)   . Hypertension    no meds now, lost 50lbs  . Migraines   . MS (multiple sclerosis) ([place])   . Neuromuscular disorder ([place])   . Obesity   . PTSD (post-traumatic stress disorder)   . Seizures ([place]) 2011   no seizure since onset  . Sleep apnea    diagnosed 20 ago, lost wt no CPAP now  . Smoking   . Stroke [facility])    doesn't know when   Past Surgical History:  Procedure Laterality Date  . CHONDROPLASTY Left 12/30/2014   Procedure: CHONDROPLASTY;  Surgeon: Leandrew Koyanagi, MD;  Location: [place];  Service: Orthopedics;  Laterality: Left;  . KNEE ARTHROSCOPY Left 12/30/2014   Procedure: LEFT KNEE ARTHROSCOPY WITH   CHONDROPLASTY;  Surgeon: Leandrew Koyanagi, MD;  Location: [place];  Service: Orthopedics;  Laterality: Left;  . TONSILLECTOMY    . TUBAL LIGATION  1992   Social History   Social History  . Marital status: Single    Spouse name: N/A  . Number of children: N/A  . Years of education: N/A   Social History Main Topics  . Smoking status: Current Every Day Smoker    Packs/day: 1.00    Years: 25.00    Types: Cigarettes  . Smokeless tobacco: Never Used     Comment: advised to d/c by 3 cigs per week.  . Alcohol use No     Comment: occasional  . Drug use: No  . Sexual activity: Yes    Birth control/ protection: None, Post-menopausal   Other Topics Concern  . None   Social History Narrative  . None   Family  History  Problem Relation Age of Onset  . Cancer Mother 34    melanoma  . Hypertension Sister   . Heart disease Sister     valve disease  . Depression Sister   . Diabetes Sister   . Sjogren's syndrome Sister    Allergies  Allergen Reactions  . Ace Inhibitors     REACTION: cough  . Aspirin Other (See Comments)    Stomach Ulcer  . Atenolol Other (See Comments)    Bradycardia  . Celexa [Citalopram Hydrobromide] Other (See Comments)    Dizziness Tolerated Lexapro   . Codeine Nausea Only  . Nitroglycerin Other (See Comments)    Drop in BP  . Phenytoin Swelling  . Sertraline Other (See Comments)    unknown  . Triamcinolone     Steroid flare after joint injection, use other steroid for injections   Prior to Admission medications   Medication Sig Start Date End Date Taking? Authorizing Provider  acetaminophen (TYLENOL) 500 MG tablet Take 1,000 mg by mouth every 6 (six) hours as needed for mild pain or headache.   Yes Historical Provider, MD  ARIPiprazole (ABILIFY) 10 MG tablet Take 1 tablet (10 mg total) by  mouth daily. 03/16/16  Yes Dara Hoyer, PA-C  celecoxib (CELEBREX) 200 MG capsule One to 2 tablets by mouth daily as needed for pain. Patient taking differently: Take 400 mg by mouth daily as needed for mild pain or moderate pain. One to 2 tablets by mouth daily as needed for pain. 07/06/16  Yes Silverio Decamp, MD  clonazePAM (KLONOPIN) 1 MG tablet Take 1 tablet (1 mg total) by mouth 3 (three) times daily as needed for anxiety. NO EARLY REFILLS (REFILL 11/28;05/11/16; 1/27 2018) 07/13/16 08/12/17 Yes Dara Hoyer, PA-C  cyclobenzaprine (FLEXERIL) 10 MG tablet Take 1 tablet (10 mg total) by mouth at bedtime as needed for muscle spasms. 05/30/16  Yes Hali Marry, MD  DULoxetine (CYMBALTA) 60 MG capsule Take 1 capsule (60 mg total) by mouth daily. 07/13/16  Yes Dara Hoyer, PA-C  gabapentin (NEURONTIN) 400 MG capsule Take 2-3 caps in am 2-3 caps in pm and 2-3 caps  HS Patient taking differently: Take 1,200 mg by mouth 3 (three) times daily.  03/16/16  Yes Dara Hoyer, PA-C  hydrochlorothiazide (HYDRODIURIL) 25 MG tablet take 1 tablet by mouth once daily if needed Patient taking differently: Take 25 mg by mouth daily as needed (for fluid retention).  10/28/15  Yes Hali Marry, MD  HYDROcodone-acetaminophen (NORCO) 7.5-325 MG tablet Take 1 tablet by mouth every 8 (eight) hours as needed for moderate pain. Ok to fill on or after 07/28/2016. 07/26/16  Yes Hali Marry, MD  metFORMIN (GLUCOPHAGE) 500 MG tablet Take 1 tablet (500 mg total) by mouth 2 (two) times daily with a meal. 05/30/16  Yes Hali Marry, MD  omeprazole (PRILOSEC) 40 MG capsule Take 1 capsule (40 mg total) by mouth daily. 05/30/16 05/30/17 Yes Hali Marry, MD  SUMAtriptan (IMITREX) 100 MG tablet Take 1 tablet (100 mg total) by mouth every 2 (two) hours as needed for migraine. No more than 2 in 24 hours. 09/16/15  Yes Hali Marry, MD  topiramate (TOPAMAX) 100 MG tablet Take 1 tablet in am and 2 tablets in PM Patient taking differently: Take 100-200 mg by mouth 2 (two) times daily. Take 1 tablet in am and 2 tablets in PM 07/13/16  Yes Dara Hoyer, PA-C  metroNIDAZOLE (FLAGYL) 500 MG tablet Take 1 tablet (500 mg total) by mouth 2 (two) times daily. Patient not taking: Reported on 07/17/2016 07/07/16   Emily Filbert, MD     Positive ROS: All other systems have been reviewed and were otherwise negative with the exception of those mentioned in the HPI and as above.  Physical Exam: General: Alert, no acute distress Cardiovascular: No pedal edema Respiratory: No cyanosis, no use of accessory musculature GI: abdomen soft Skin: No lesions in the area of chief complaint Neurologic: Sensation intact distally Psychiatric: Patient is competent for consent with normal mood and affect Lymphatic: no lymphedema  MUSCULOSKELETAL: exam stable  Assessment: left knee  osteoarthritis  Plan: Plan for Procedure(s): LEFT UNICOMPARTMENTAL KNEE ARTHROPLASTY  The risks benefits and alternatives were discussed with the patient including but not limited to the risks of nonoperative treatment, versus surgical intervention including infection, bleeding, nerve injury,  blood clots, cardiopulmonary complications, morbidity, mortality, among others, and they were willing to proceed.   Eduard Roux, MD   08/03/2016 2:30 PM

## 2016-08-04 ENCOUNTER — Encounter (HOSPITAL_COMMUNITY): Payer: Self-pay | Admitting: Orthopaedic Surgery

## 2016-08-04 DIAGNOSIS — F419 Anxiety disorder, unspecified: Secondary | ICD-10-CM | POA: Diagnosis not present

## 2016-08-04 DIAGNOSIS — Z96652 Presence of left artificial knee joint: Secondary | ICD-10-CM | POA: Diagnosis not present

## 2016-08-04 DIAGNOSIS — G35 Multiple sclerosis: Secondary | ICD-10-CM | POA: Diagnosis not present

## 2016-08-04 DIAGNOSIS — K219 Gastro-esophageal reflux disease without esophagitis: Secondary | ICD-10-CM | POA: Diagnosis not present

## 2016-08-04 DIAGNOSIS — F329 Major depressive disorder, single episode, unspecified: Secondary | ICD-10-CM | POA: Diagnosis not present

## 2016-08-04 DIAGNOSIS — M1712 Unilateral primary osteoarthritis, left knee: Secondary | ICD-10-CM | POA: Diagnosis not present

## 2016-08-04 DIAGNOSIS — I83813 Varicose veins of bilateral lower extremities with pain: Secondary | ICD-10-CM | POA: Diagnosis not present

## 2016-08-04 DIAGNOSIS — I10 Essential (primary) hypertension: Secondary | ICD-10-CM | POA: Diagnosis not present

## 2016-08-04 DIAGNOSIS — M797 Fibromyalgia: Secondary | ICD-10-CM | POA: Diagnosis not present

## 2016-08-04 DIAGNOSIS — G894 Chronic pain syndrome: Secondary | ICD-10-CM | POA: Diagnosis not present

## 2016-08-04 DIAGNOSIS — Z471 Aftercare following joint replacement surgery: Secondary | ICD-10-CM | POA: Diagnosis not present

## 2016-08-04 LAB — BASIC METABOLIC PANEL
Anion gap: 11 (ref 5–15)
BUN: 18 mg/dL (ref 6–20)
CO2: 28 mmol/L (ref 22–32)
Calcium: 8.7 mg/dL — ABNORMAL LOW (ref 8.9–10.3)
Chloride: 100 mmol/L — ABNORMAL LOW (ref 101–111)
Creatinine, Ser: 1.01 mg/dL — ABNORMAL HIGH (ref 0.44–1.00)
GFR calc Af Amer: >60 mL/min (ref >60)
GFR calc non Af Amer: >60 mL/min (ref >60)
Glucose, Bld: 141 mg/dL — ABNORMAL HIGH (ref 65–99)
Potassium: 4.9 mmol/L (ref 3.5–5.1)
Sodium: 139 mmol/L (ref 135–145)

## 2016-08-04 LAB — CBC
HCT: 44 % (ref 36.0–46.0)
Hemoglobin: 13.7 g/dL (ref 12.0–15.0)
MCH: 28 pg (ref 26.0–34.0)
MCHC: 31.1 g/dL (ref 30.0–36.0)
MCV: 89.8 fL (ref 78.0–100.0)
Platelets: 241 10*3/uL (ref 150–400)
RBC: 4.9 MIL/uL (ref 3.87–5.11)
RDW: 13.8 % (ref 11.5–15.5)
WBC: 13.2 10*3/uL — ABNORMAL HIGH (ref 4.0–10.5)

## 2016-08-04 MED ORDER — PANTOPRAZOLE SODIUM 40 MG PO TBEC
40.0000 mg | DELAYED_RELEASE_TABLET | Freq: Every day | ORAL | Status: DC
  Administered 2016-08-04: 40 mg via ORAL
  Filled 2016-08-04: qty 1

## 2016-08-04 MED ORDER — RIVAROXABAN 10 MG PO TABS: 10.0000 mg | ORAL_TABLET | Freq: Every day | ORAL | 0 refills | Status: DC

## 2016-08-04 MED ORDER — RIVAROXABAN 10 MG PO TABS
10.0000 mg | ORAL_TABLET | Freq: Every day | ORAL | Status: DC
  Administered 2016-08-04: 10 mg via ORAL
  Filled 2016-08-04: qty 1

## 2016-08-04 NOTE — Progress Notes (Signed)
   Subjective:  Patient reports pain as moderate.    Objective:   VITALS:   Vitals:   08/03/16 1945 08/03/16 2000 08/03/16 2023 08/04/16 0255  BP: 136/86 127/73 113/76 139/68  Pulse: 89 70 84 97  Resp: 20 13 14 14   Temp:  97.8 F (36.6 C) 98 F (36.7 C) 98 F (36.7 C)  TempSrc:    Oral  SpO2: 97% 96% 96% 91%  Weight:      Height:        Neurologically intact Neurovascular intact Sensation intact distally Intact pulses distally Dorsiflexion/Plantar flexion intact Incision: dressing C/D/I and no drainage No cellulitis present Compartment soft   Lab Results  Component Value Date   WBC 13.2 (H) 08/04/2016   HGB 13.7 08/04/2016   HCT 44.0 08/04/2016   MCV 89.8 08/04/2016   PLT 241 08/04/2016     Assessment/Plan:  1 Day Post-Op   - Expected postop acute blood loss anemia - will monitor for symptoms - Up with PT/OT - DVT ppx - SCDs, ambulation, xarelto - WBAT operative extremity - Pain control - Discharge planning - home today pm - [facility]  Eduard Roux 08/04/2016, 7:27 AM [phone]

## 2016-08-04 NOTE — Evaluation (Signed)
Occupational Therapy Evaluation Patient Details Name: Ashley Gomez MRN: [id number] DOB: Jul 14, 1970 Today's Date: 08/04/2016    History of Present Illness Pt is a 46 yo female with end stage L knee OA, s/p L partial knee replacement 08/03/16, PMH includes OA, CVA, DVT, fibromyalgia, MS, PTSD, seizures, HTN, and neuromuscular disorder.    Clinical Impression   Pt admitted with the above diagnosis and overall is doing well requiring min assist only with a few adls. Pt already able to donn L sock and shoes as well as pants.  Pt was very fatigued this session from not sleeping well.  Recommend daughter be close by when pt first showers.  No further OT needs at this time.      Follow Up Recommendations  No OT follow up;Supervision - Intermittent    Equipment Recommendations  3 in 1 bedside commode    Recommendations for Other Services       Precautions / Restrictions Precautions Precautions: Knee Restrictions Weight Bearing Restrictions: Yes LLE Weight Bearing: Weight bearing as tolerated      Mobility Bed Mobility               General bed mobility comments: Pt in chair on arrival.  Transfers Overall transfer level: Needs assistance Equipment used: Rolling walker (2 wheeled) Transfers: Sit to/from Stand Sit to Stand: Supervision              Balance Overall balance assessment: Needs assistance Sitting-balance support: Feet supported Sitting balance-Leahy Scale: Good     Standing balance support: Bilateral upper extremity supported Standing balance-Leahy Scale: Fair Standing balance comment: requires RW support                           ADL either performed or assessed with clinical judgement   ADL Overall ADL's : Needs assistance/impaired Eating/Feeding: Independent;Sitting   Grooming: Supervision/safety;Standing   Upper Body Bathing: Set up;Sitting   Lower Body Bathing: Supervison/ safety;Sit to/from stand   Upper Body Dressing : Set  up;Sitting   Lower Body Dressing: Supervision/safety;Sit to/from stand Lower Body Dressing Details (indicate cue type and reason): pt donned L sock and shoe without assist. Toilet Transfer: Supervision/safety;Regular Toilet;BSC;RW Toilet Transfer Details (indicate cue type and reason): pt walked to bathroom.  Put 3:1 over commode to simulate what she will have at home. Toileting- Clothing Manipulation and Hygiene: Supervision/safety;Sitting/lateral lean   Tub/ Shower Transfer: Tub Product manager Details (indicate cue type and reason): Discussed the transfer.  Pt too fatigued to try it in the therapy gym tub.  Educated pt on safest way to get into and out of tub and instructed her to have daugter with her when she first gets in. Pt things she may sponge bathe for the first few days. Functional mobility during ADLs: Min guard (min guard only bc she felt tired from recent pain meds.) General ADL Comments: Pt doing well with LE adls as well as UE adls.     Vision Baseline Vision/History: No visual deficits Patient Visual Report: No change from baseline       Perception     Praxis      Pertinent Vitals/Pain Pain Assessment: Faces Faces Pain Scale: Hurts even more Pain Location: L knee  Pain Descriptors / Indicators: Burning;Aching;Constant Pain Intervention(s): Limited activity within patient's tolerance;Monitored during session;Premedicated before session;Repositioned     Hand Dominance Right   Extremity/Trunk Assessment Upper Extremity Assessment Upper Extremity Assessment: Overall WFL for tasks assessed  Lower Extremity Assessment Lower Extremity Assessment: Defer to PT evaluation   Cervical / Trunk Assessment Cervical / Trunk Assessment: Normal   Communication Communication Communication: No difficulties   Cognition Arousal/Alertness: Awake/alert Behavior During Therapy: WFL for tasks assessed/performed Overall Cognitive Status: Within Functional Limits  for tasks assessed                                 General Comments: Pt sleepy possibly due to recent dose of pain meds   General Comments  Pt was teary and tired but performed well.      Exercises     Shoulder Instructions      Home Living Family/patient expects to be discharged to:: Private residence Living Arrangements: Children Available Help at Discharge: Available PRN/intermittently;Available 24 hours/day;Friend(s);Family Type of Home: House Home Access: Level entry     Home Layout: Two level;Laundry or work area in basement     [employer] Shower/Tub: Corporate investment banker: Programmer, systems: Yes How Accessible: Accessible via walker Home Equipment: None          Prior Functioning/Environment Level of Independence: Independent        Comments: Advertising copywriter        OT Problem List:        OT Treatment/Interventions:      OT Goals(Current goals can be found in the care plan section) Acute Rehab OT Goals Patient Stated Goal: to go home soon OT Goal Formulation: All assessment and education complete, DC therapy  OT Frequency:     Barriers to D/C:            Co-evaluation              End of Session Equipment Utilized During Treatment: Surveyor, mining Communication: Mobility status  Activity Tolerance: Patient tolerated treatment well Patient left: in chair;with call bell/phone within reach  OT Visit Diagnosis: Unsteadiness on feet (R26.81)                Time: 4270-6237 OT Time Calculation (min): 14 min Charges:  OT General Charges $OT Visit: 1 Procedure OT Evaluation $OT Eval Moderate Complexity: 1 Procedure G-Codes: OT G-codes **NOT FOR INPATIENT CLASS** Functional Assessment Tool Used: Clinical judgement Functional Limitation: Self care Self Care Current Status (S2831): At least 1 percent but less than 20 percent impaired, limited or restricted Self Care Goal  Status (D1761): At least 1 percent but less than 20 percent impaired, limited or restricted Self Care Discharge Status [phone] At least 1 percent but less than 20 percent impaired, limited or restricted   Jinger Neighbors, OTR/L [phone]  Glenford Peers 08/04/2016, 1:24 PM

## 2016-08-04 NOTE — Discharge Summary (Signed)
Physician Discharge Summary      Patient ID: Ashley Gomez MRN: [id number] DOB/AGE: [birth year] 46 y.o.  Admit date: 08/03/2016 Discharge date: 08/04/2016  Admission Diagnoses:  <principal problem not specified>  Discharge Diagnoses:  Active Problems:   Status post left partial knee replacement   Past Medical History:  Diagnosis Date  . Anxiety   . Arthritis   . CVA (cerebral infarction)    Old CVA on MRI brain  . Depression   . DVT (deep venous thrombosis) ([place]) 27/10/2374   L basilic vein   . Facet hypertrophy of lumbar region    MRI 2007  . Fibromyalgia   . GERD (gastroesophageal reflux disease)   . Hypertension    no meds now, lost 50lbs  . Migraines   . MS (multiple sclerosis) ([place])   . Neuromuscular disorder ([place])   . Obesity   . PTSD (post-traumatic stress disorder)   . Seizures ([place]) 2011   no seizure since onset  . Sleep apnea    diagnosed 20 ago, lost wt no CPAP now  . Smoking   . Stroke [facility])    doesn't know when    Surgeries: Procedure(s): LEFT UNICOMPARTMENTAL KNEE ARTHROPLASTY on 08/03/2016   Consultants (if any):   Discharged Condition: Improved  Hospital Course: Ashley Gomez is an 46 y.o. female who was admitted 08/03/2016 with a diagnosis of <principal problem not specified> and went to the operating room on 08/03/2016 and underwent the above named procedures.    She was given perioperative antibiotics:  Anti-infectives    Start     Dose/Rate Route Frequency Ordered Stop   08/04/16 0600  ceFAZolin (ANCEF) IVPB 2g/100 mL premix     2 g 200 mL/hr over 30 Minutes Intravenous On call to O.R. 08/03/16 1310 08/03/16 1540   08/03/16 2130  ceFAZolin (ANCEF) IVPB 2g/100 mL premix     2 g 200 mL/hr over 30 Minutes Intravenous Every 6 hours 08/03/16 1839 08/04/16 0324    .  She was given sequential compression devices, early ambulation, and xarelto for DVT prophylaxis.  She benefited maximally from the hospital stay and there were no  complications.    Recent vital signs:  Vitals:   08/04/16 0255 08/04/16 0525  BP: 139/68 117/72  Pulse: 97 94  Resp: 14 15  Temp: 98 F (36.7 C) 98.3 F (36.8 C)    Recent laboratory studies:  Lab Results  Component Value Date   HGB 13.7 08/04/2016   HGB 15.3 (H) 07/31/2016   HGB 14.3 10/28/2015   Lab Results  Component Value Date   WBC 13.2 (H) 08/04/2016   PLT 241 08/04/2016   Lab Results  Component Value Date   INR 0.93 07/31/2016   Lab Results  Component Value Date   NA 139 08/04/2016   K 4.9 08/04/2016   CL 100 (L) 08/04/2016   CO2 28 08/04/2016   BUN 18 08/04/2016   CREATININE 1.01 (H) 08/04/2016   GLUCOSE 141 (H) 08/04/2016    Discharge Medications:   Allergies as of 08/04/2016      Reactions   Ace Inhibitors    REACTION: cough   Aspirin Other (See Comments)   Stomach Ulcer   Atenolol Other (See Comments)   Bradycardia   Celexa [citalopram Hydrobromide] Other (See Comments)   Dizziness Tolerated Lexapro   Codeine Nausea Only   Nitroglycerin Other (See Comments)   Drop in BP   Phenytoin Swelling   Sertraline Other (See Comments)  unknown   Triamcinolone    Steroid flare after joint injection, use other steroid for injections      Medication List    TAKE these medications   acetaminophen 500 MG tablet Commonly known as:  TYLENOL Take 1,000 mg by mouth every 6 (six) hours as needed for mild pain or headache.   ARIPiprazole 10 MG tablet Commonly known as:  ABILIFY Take 1 tablet (10 mg total) by mouth daily.   celecoxib 200 MG capsule Commonly known as:  CELEBREX One to 2 tablets by mouth daily as needed for pain. What changed:  how much to take  how to take this  when to take this  reasons to take this  additional instructions   clonazePAM 1 MG tablet Commonly known as:  KLONOPIN Take 1 tablet (1 mg total) by mouth 3 (three) times daily as needed for anxiety. NO EARLY REFILLS (REFILL 11/28;05/11/16; 1/27 2018)     cyclobenzaprine 10 MG tablet Commonly known as:  FLEXERIL Take 1 tablet (10 mg total) by mouth at bedtime as needed for muscle spasms.   DULoxetine 60 MG capsule Commonly known as:  CYMBALTA Take 1 capsule (60 mg total) by mouth daily.   gabapentin 400 MG capsule Commonly known as:  NEURONTIN Take 2-3 caps in am 2-3 caps in pm and 2-3 caps HS What changed:  how much to take  how to take this  when to take this  additional instructions   hydrochlorothiazide 25 MG tablet Commonly known as:  HYDRODIURIL take 1 tablet by mouth once daily if needed What changed:  how much to take  how to take this  when to take this  reasons to take this  additional instructions   HYDROcodone-acetaminophen 7.5-325 MG tablet Commonly known as:  NORCO Take 1 tablet by mouth every 8 (eight) hours as needed for moderate pain. Ok to fill on or after 07/28/2016.   metFORMIN 500 MG tablet Commonly known as:  GLUCOPHAGE Take 1 tablet (500 mg total) by mouth 2 (two) times daily with a meal.   methocarbamol 750 MG tablet Commonly known as:  ROBAXIN Take 1 tablet (750 mg total) by mouth 2 (two) times daily as needed for muscle spasms.   metroNIDAZOLE 500 MG tablet Commonly known as:  FLAGYL Take 1 tablet (500 mg total) by mouth 2 (two) times daily.   omeprazole 40 MG capsule Commonly known as:  PRILOSEC Take 1 capsule (40 mg total) by mouth daily.   ondansetron 4 MG tablet Commonly known as:  ZOFRAN Take 1-2 tablets (4-8 mg total) by mouth every 8 (eight) hours as needed for nausea or vomiting.   oxyCODONE 5 MG immediate release tablet Commonly known as:  Oxy IR/ROXICODONE Take 1-3 tablets (5-15 mg total) by mouth every 4 (four) hours as needed.   oxyCODONE 10 mg 12 hr tablet Commonly known as:  OXYCONTIN Take 1 tablet (10 mg total) by mouth every 12 (twelve) hours.   promethazine 25 MG tablet Commonly known as:  PHENERGAN Take 1 tablet (25 mg total) by mouth every 6 (six) hours  as needed for nausea.   rivaroxaban 10 MG Tabs tablet Commonly known as:  XARELTO Take 1 tablet (10 mg total) by mouth daily.   senna-docusate 8.6-50 MG tablet Commonly known as:  SENOKOT S Take 1 tablet by mouth at bedtime as needed.   SUMAtriptan 100 MG tablet Commonly known as:  IMITREX Take 1 tablet (100 mg total) by mouth every 2 (two) hours as  needed for migraine. No more than 2 in 24 hours.   topiramate 100 MG tablet Commonly known as:  TOPAMAX Take 1 tablet in am and 2 tablets in PM What changed:  how much to take  how to take this  when to take this  additional instructions            Durable Medical Equipment        Start     Ordered   08/04/16 1244  DME Walker rolling  Once    Comments:  Needs wide  Question:  Patient needs a walker to treat with the following condition  Answer:  Total knee replacement status   08/04/16 1243   08/03/16 2047  DME 3 n 1  Once     08/03/16 2046   08/03/16 2047  DME Bedside commode  Once    Question:  Patient needs a bedside commode to treat with the following condition  Answer:  Total knee replacement status   08/03/16 2046      Diagnostic Studies: Dg Shoulder Left  Result Date: 07/06/2016 CLINICAL DATA:  Chronic left shoulder pain EXAM: LEFT SHOULDER - 2+ VIEW COMPARISON:  None. FINDINGS: There is no evidence of fracture or dislocation. There is no evidence of arthropathy or other focal bone abnormality. Soft tissues are unremarkable. IMPRESSION: Negative. Electronically Signed   By: Franchot Gallo M.D.   On: 07/06/2016 16:58   Dg Knee Left Port  Result Date: 08/03/2016 CLINICAL DATA:  Partial knee replacement EXAM: PORTABLE LEFT KNEE - 1-2 VIEW COMPARISON:  12/21/2011 FINDINGS: No fracture or malalignment. Patient is status post hemiarthroplasty of the medial knee. Small pockets of soft tissue gas anteriorly and gas within the joint space are felt postoperative. IMPRESSION: Status post medial hemiarthroplasty with  expected postsurgical changes. Electronically Signed   By: Donavan Foil M.D.   On: 08/03/2016 19:46    Disposition: 01-Home or Self Care  Discharge Instructions    Call MD / Call 911    Complete by:  As directed    If you experience chest pain or shortness of breath, CALL 911 and be transported to the hospital emergency room.  If you develope a fever above 101.5 F, pus (white drainage) or increased drainage or redness at the wound, or calf pain, call your surgeon's office.   Constipation Prevention    Complete by:  As directed    Drink plenty of fluids.  Prune juice may be helpful.  You may use a stool softener, such as Colace (over the counter) 100 mg twice a day.  Use MiraLax (over the counter) for constipation as needed.   Driving restrictions    Complete by:  As directed    No driving while taking narcotic pain meds.   Increase activity slowly as tolerated    Complete by:  As directed       Follow-up Information    Eduard Roux, MD In 1 week.   Specialty:  Orthopedic Surgery Why:  For wound re-check Contact information: [place] [address] [phone]            Signed: Eduard Roux 08/04/2016, 1:33 PM

## 2016-08-04 NOTE — Discharge Instructions (Signed)
INSTRUCTIONS AFTER JOINT REPLACEMENT   o Remove items at home which could result in a fall. This includes throw rugs or furniture in walking pathways o ICE to the affected joint every three hours while awake for 30 minutes at a time, for at least the first 3-5 days, and then as needed for pain and swelling.  Continue to use ice for pain and swelling. You may notice swelling that will progress down to the foot and ankle.  This is normal after surgery.  Elevate your leg when you are not up walking on it.   o Continue to use the breathing machine you got in the hospital (incentive spirometer) which will help keep your temperature down.  It is common for your temperature to cycle up and down following surgery, especially at night when you are not up moving around and exerting yourself.  The breathing machine keeps your lungs expanded and your temperature down.   DIET:  As you were doing prior to hospitalization, we recommend a well-balanced diet.  DRESSING / WOUND CARE / SHOWERING  You may change your surgical dressing 7 days after surgery.  Then change the dressing every day with sterile gauze.  Please use good hand washing techniques before changing the dressing.  Do not use any lotions or creams on the incision until instructed by your surgeon.  You may shower while you have the surgical dressing which is waterproof.  After removal of surgical dressing, you must cover the incision when showering.  ACTIVITY  o Increase activity slowly as tolerated, but follow the weight bearing instructions below.   o No driving for 6 weeks or until further direction given by your physician.  You cannot drive while taking narcotics.  o No lifting or carrying greater than 10 lbs. until further directed by your surgeon. o Avoid periods of inactivity such as sitting longer than an hour when not asleep. This helps prevent blood clots.  o You may return to work once you are authorized by your doctor.     WEIGHT  BEARING   Weight bearing as tolerated with assist device (walker, cane, etc) as directed, use it as long as suggested by your surgeon or therapist, typically at least 4-6 weeks.   EXERCISES  Results after joint replacement surgery are often greatly improved when you follow the exercise, range of motion and muscle strengthening exercises prescribed by your doctor. Safety measures are also important to protect the joint from further injury. Any time any of these exercises cause you to have increased pain or swelling, decrease what you are doing until you are comfortable again and then slowly increase them. If you have problems or questions, call your caregiver or physical therapist for advice.   Rehabilitation is important following a joint replacement. After just a few days of immobilization, the muscles of the leg can become weakened and shrink (atrophy).  These exercises are designed to build up the tone and strength of the thigh and leg muscles and to improve motion. Often times heat used for twenty to thirty minutes before working out will loosen up your tissues and help with improving the range of motion but do not use heat for the first two weeks following surgery (sometimes heat can increase post-operative swelling).   These exercises can be done on a training (exercise) mat, on the floor, on a table or on a bed. Use whatever works the best and is most comfortable for you.    Use music or television while  you are exercising so that the exercises are a pleasant break in your day. This will make your life better with the exercises acting as a break in your routine that you can look forward to.   Perform all exercises about fifteen times, three times per day or as directed.  You should exercise both the operative leg and the other leg as well. ° °Exercises include: °  °• Quad Sets - Tighten up the muscle on the front of the thigh (Quad) and hold for 5-10 seconds.   °• Straight Leg Raises - With your  knee straight (if you were given a brace, keep it on), lift the leg to 60 degrees, hold for 3 seconds, and slowly lower the leg.  Perform this exercise against resistance later as your leg gets stronger.  °• Leg Slides: Lying on your back, slowly slide your foot toward your buttocks, bending your knee up off the floor (only go as far as is comfortable). Then slowly slide your foot back down until your leg is flat on the floor again.  °• Angel Wings: Lying on your back spread your legs to the side as far apart as you can without causing discomfort.  °• Hamstring Strength:  Lying on your back, push your heel against the floor with your leg straight by tightening up the muscles of your buttocks.  Repeat, but this time bend your knee to a comfortable angle, and push your heel against the floor.  You may put a pillow under the heel to make it more comfortable if necessary.  ° °A rehabilitation program following joint replacement surgery can speed recovery and prevent re-injury in the future due to weakened muscles. Contact your doctor or a physical therapist for more information on knee rehabilitation.  ° ° °CONSTIPATION ° °Constipation is defined medically as fewer than three stools per week and severe constipation as less than one stool per week.  Even if you have a regular bowel pattern at home, your normal regimen is likely to be disrupted due to multiple reasons following surgery.  Combination of anesthesia, postoperative narcotics, change in appetite and fluid intake all can affect your bowels.  ° °YOU MUST use at least one of the following options; they are listed in order of increasing strength to get the job done.  They are all available over the counter, and you may need to use some, POSSIBLY even all of these options:   ° °Drink plenty of fluids (prune juice may be helpful) and high fiber foods °Colace 100 mg by mouth twice a day  °Senokot for constipation as directed and as needed Dulcolax (bisacodyl), take  with full glass of water  °Miralax (polyethylene glycol) once or twice a day as needed. ° °If you have tried all these things and are unable to have a bowel movement in the first 3-4 days after surgery call either your surgeon or your primary doctor.   ° °If you experience loose stools or diarrhea, hold the medications until you stool forms back up.  If your symptoms do not get better within 1 week or if they get worse, check with your doctor.  If you experience "the worst abdominal pain ever" or develop nausea or vomiting, please contact the office immediately for further recommendations for treatment. ° ° °ITCHING:  If you experience itching with your medications, try taking only a single pain pill, or even half a pain pill at a time.  You can also use Benadryl over the counter   for itching or also to help with sleep.  ° °TED HOSE STOCKINGS:  Use stockings on both legs until for at least 2 weeks or as directed by physician office. They may be removed at night for sleeping. ° °MEDICATIONS:  See your medication summary on the “After Visit Summary” that nursing will review with you.  You may have some home medications which will be placed on hold until you complete the course of blood thinner medication.  It is important for you to complete the blood thinner medication as prescribed. ° °PRECAUTIONS:  If you experience chest pain or shortness of breath - call 911 immediately for transfer to the hospital emergency department.  ° °If you develop a fever greater that 101 F, purulent drainage from wound, increased redness or drainage from wound, foul odor from the wound/dressing, or calf pain - CONTACT YOUR SURGEON.   °                                                °FOLLOW-UP APPOINTMENTS:  If you do not already have a post-op appointment, please call the office for an appointment to be seen by your surgeon.  Guidelines for how soon to be seen are listed in your “After Visit Summary”, but are typically between 1-4 weeks  after surgery. ° °OTHER INSTRUCTIONS:  ° °Knee Replacement:  Do not place pillow under knee, focus on keeping the knee straight while resting. CPM instructions: 0-90 degrees, 2 hours in the morning, 2 hours in the afternoon, and 2 hours in the evening. Place foam block, curve side up under heel at all times except when in CPM or when walking.  DO NOT modify, tear, cut, or change the foam block in any way. ° °MAKE SURE YOU:  °• Understand these instructions.  °• Get help right away if you are not doing well or get worse.  ° ° °Thank you for letting us be a part of your medical care team.  It is a privilege we respect greatly.  We hope these instructions will help you stay on track for a fast and full recovery!  ° ° ° ° °Information on my medicine - XARELTO® (Rivaroxaban) ° °This medication education was reviewed with me or my healthcare representative as part of my discharge preparation.  The pharmacist that spoke with me during my hospital stay was:  Kiyaan Haq Dien, RPH ° °Why was Xarelto® prescribed for you? °Xarelto® was prescribed for you to reduce the risk of blood clots forming after orthopedic surgery. The medical term for these abnormal blood clots is venous thromboembolism (VTE). ° °What do you need to know about xarelto® ? °Take your Xarelto® ONCE DAILY at the same time every day. °You may take it either with or without food. ° °If you have difficulty swallowing the tablet whole, you may crush it and mix in applesauce just prior to taking your dose. ° °Take Xarelto® exactly as prescribed by your doctor and DO NOT stop taking Xarelto® without talking to the doctor who prescribed the medication.  Stopping without other VTE prevention medication to take the place of Xarelto® may increase your risk of developing a clot. ° °After discharge, you should have regular check-up appointments with your healthcare provider that is prescribing your Xarelto®.   ° °What do you do if you miss a dose? °If you miss   a dose,  take it as soon as you remember on the same day then continue your regularly scheduled once daily regimen the next day. Do not take two doses of Xarelto® on the same day.  ° °Important Safety Information °A possible side effect of Xarelto® is bleeding. You should call your healthcare provider right away if you experience any of the following: °? Bleeding from an injury or your nose that does not stop. °? Unusual colored urine (red or dark brown) or unusual colored stools (red or black). °? Unusual bruising for unknown reasons. °? A serious fall or if you hit your head (even if there is no bleeding). ° °Some medicines may interact with Xarelto® and might increase your risk of bleeding while on Xarelto®. To help avoid this, consult your healthcare provider or pharmacist prior to using any new prescription or non-prescription medications, including herbals, vitamins, non-steroidal anti-inflammatory drugs (NSAIDs) and supplements. ° °This website has more information on Xarelto®: www.xarelto.com. ° ° ° °

## 2016-08-04 NOTE — Progress Notes (Signed)
Discharge instructions and prescriptions provided to patient.  Front wheel walker and bedside commode delivered to room.  Patient ice wrap and ice packs in belongings bag.  Cell phone with patient.  IV removed.  No questions at time of discharge.

## 2016-08-04 NOTE — Progress Notes (Signed)
qPhysical Therapy Treatment Patient Details Name: Ashley Gomez MRN: [id number] DOB: 05/24/1970 Today's Date: 08/04/2016    History of Present Illness Pt is a 46 yo female with end stage L knee OA, s/p L partial knee replacement 08/03/16, PMH includes OA, CVA, DVT, fibromyalgia, MS, PTSD, seizures, HTN, and neuromuscular disorder.     PT Comments    Pt consented to PT as she is eager to get home. Pt is mod I for transfer sit<>stand to RW and ambulates 200' with rolling walker with supervision. As pt becomes more painful with gait she lacks complete L foot clearance vc for picking up foot or taking break to ensure safety. Pt is ready for discharge from acute setting but requires continued skilled PT to continue gait training and to improve endurance as well as L LE strength and ROM to be safe in her home environment.     Follow Up Recommendations  Home health PT;Supervision/Assistance - 24 hour     Equipment Recommendations  3in1 (PT);Rolling walker with 5" wheels    Recommendations for Other Services OT consult     Precautions / Restrictions Precautions Precautions: Knee Restrictions Weight Bearing Restrictions: Yes LLE Weight Bearing: Weight bearing as tolerated    Mobility  Bed Mobility         Supine to sit: Min assist     General bed mobility comments: in chair on arrival   Transfers Overall transfer level: Needs assistance Equipment used: Rolling walker (2 wheeled) Transfers: Sit to/from Stand Sit to Stand: Modified independent (Device/Increase time)         General transfer comment: able to use safe technique to come to standing pushing off from the chair  Ambulation/Gait Ambulation/Gait assistance: Supervision Ambulation Distance (Feet): 200 Feet Assistive device: Rolling walker (2 wheeled) Gait Pattern/deviations: Decreased step length - right;Decreased stance time - left;Decreased weight shift to left;Shuffle;Antalgic;Trunk flexed;Step-through  pattern Gait velocity: slow Gait velocity interpretation: Below normal speed for age/gender General Gait Details: pt cued for L foot clearance in swing, even if it means slowing her cadence       Balance Overall balance assessment: Needs assistance Sitting-balance support: Single extremity supported;Feet unsupported Sitting balance-Leahy Scale: Good     Standing balance support: Bilateral upper extremity supported Standing balance-Leahy Scale: Fair Standing balance comment: requires RW support                            Cognition Arousal/Alertness: Awake/alert Behavior During Therapy: WFL for tasks assessed/performed Overall Cognitive Status: Within Functional Limits for tasks assessed                                 General Comments: Pt sleepy possibly due to recent dose of pain meds      Exercises      General Comments General comments (skin integrity, edema, etc.): Pt was teary and tired but performed well.        Pertinent Vitals/Pain Pain Assessment: Faces Faces Pain Scale: Hurts even more Pain Location: L knee  Pain Descriptors / Indicators: Burning;Aching;Constant Pain Intervention(s): Limited activity within patient's tolerance;Monitored during session    Home Living Family/patient expects to be discharged to:: Private residence Living Arrangements: Children Available Help at Discharge: Available PRN/intermittently;Available 24 hours/day;Friend(s);Family Type of Home: House Home Access: Level entry   Home Layout: Two level;Laundry or work area in [employer]: None  Prior Function Level of Independence: Independent      Comments: community ambulator and driver   PT Goals (current goals can now be found in the care plan section) Acute Rehab PT Goals Patient Stated Goal: to go home soon PT Goal Formulation: With patient Time For Goal Achievement: 08/11/16 Potential to Achieve Goals: Good    Frequency     7X/week      PT Plan Current plan remains appropriate       End of Session Equipment Utilized During Treatment: Gait belt Activity Tolerance: Patient limited by pain Patient left: in chair;with call bell/phone within reach Nurse Communication: Mobility status PT Visit Diagnosis: Other abnormalities of gait and mobility (R26.89);Pain Pain - Right/Left: Left Pain - part of body: Knee     Time: 4650-3546 PT Time Calculation (min) (ACUTE ONLY): 22 min  Charges:  $Gait Training: 8-22 mins                    G Codes:  Functional Assessment Tool Used: AM-PAC 6 Clicks Basic Mobility Functional Limitation: Mobility: Walking and moving around Mobility: Walking and Moving Around Current Status (F6812): At least 40 percent but less than 60 percent impaired, limited or restricted Mobility: Walking and Moving Around Goal Status [phone]): At least 1 percent but less than 20 percent impaired, limited or restricted    Benjamine Mola B. Migdalia Dk PT, DPT Acute Rehabilitation  [phone] Pager [phone]    [place] 08/04/2016, 2:23 PM

## 2016-08-04 NOTE — Evaluation (Signed)
Physical Therapy Evaluation Patient Details Name: Ashley Gomez MRN: [id number] DOB: 06-26-1970 Today's Date: 08/04/2016   History of Present Illness  Pt is a 46 yo female with end stage L knee OA, s/p L partial knee replacement 08/03/16, PMH includes OA, CVA, DVT, fibromyalgia, MS, PTSD, seizures, HTN, and neuromuscular disorder.   Clinical Impression  Patient is s/p above surgery resulting in functional limitations due to the deficits listed below (see PT Problem List). Pt is min A with bed mobility for LE management, min guard for sit<>stand to RW and min A for ambulation of 100 ft using RW. Pt ambulates with slow, antalgic gait with no LoB, no buckling. Patient will benefit from skilled PT to increase their independence and safety with mobility to allow discharge to the venue listed below.       Follow Up Recommendations Home health PT;Supervision/Assistance - 24 hour    Equipment Recommendations  3in1 (PT);Rolling walker with 5" wheels    Recommendations for Other Services OT consult     Precautions / Restrictions Precautions Precautions: Knee Restrictions Weight Bearing Restrictions: Yes LLE Weight Bearing: Weight bearing as tolerated      Mobility  Bed Mobility Overal bed mobility: Needs Assistance Bed Mobility: Supine to Sit     Supine to sit: Min assist     General bed mobility comments: pt able to use bed rail to pull herself to EoB, minA to manage LE to floor  Transfers Overall transfer level: Needs assistance Equipment used: Rolling walker (2 wheeled) Transfers: Sit to/from Stand Sit to Stand: Min guard         General transfer comment: vc for hand placement to push up to standing   Ambulation/Gait Ambulation/Gait assistance: Min assist Ambulation Distance (Feet): 100 Feet Assistive device: Rolling walker (2 wheeled) Gait Pattern/deviations: Decreased step length - right;Decreased stance time - left;Step-to pattern;Decreased weight shift to  left;Shuffle;Antalgic;Trunk flexed Gait velocity: slow Gait velocity interpretation: Below normal speed for age/gender General Gait Details: pt achieved slow steady gait with no buckling, no LoB, vc for upright posture and increased weight shift through L LE       Balance Overall balance assessment: Needs assistance Sitting-balance support: Single extremity supported;Feet unsupported Sitting balance-Leahy Scale: Fair     Standing balance support: Bilateral upper extremity supported Standing balance-Leahy Scale: Fair Standing balance comment: requires RW support                             Pertinent Vitals/Pain Pain Assessment: 0-10 Pain Score: 6  Pain Location: L knee  Pain Descriptors / Indicators: Burning;Aching;Constant Pain Intervention(s): Premedicated before session;Limited activity within patient's tolerance;Monitored during session  VSS    Home Living Family/patient expects to be discharged to:: Private residence Living Arrangements: Children Available Help at Discharge: Available PRN/intermittently;Available 24 hours/day;Friend(s);Family Type of Home: House Home Access: Level entry     Home Layout: Two level;Laundry or work area in [employer]: None      Prior Function Level of Independence: Independent         Comments: Advertising copywriter        Extremity/Trunk Assessment   Upper Extremity Assessment Upper Extremity Assessment: Defer to OT evaluation    Lower Extremity Assessment Lower Extremity Assessment: LLE deficits/detail LLE Deficits / Details: knee ROM limited by pain and swelling, L hip and ankle ROM WFL LLE: Unable to fully assess due to pain       Communication  Communication: No difficulties  Cognition Arousal/Alertness: Awake/alert Behavior During Therapy: WFL for tasks assessed/performed Overall Cognitive Status: Within Functional Limits for tasks assessed                                            Exercises Total Joint Exercises Ankle Circles/Pumps: AROM;Both;10 reps;Seated Quad Sets: AROM;Both;10 reps;Seated Long Arc Quad: AROM;Left;10 reps;Seated Knee Flexion: AROM;Left;10 reps;Seated   Assessment/Plan    PT Assessment Patient needs continued PT services  PT Problem List Decreased strength;Decreased range of motion;Decreased activity tolerance;Decreased mobility;Decreased knowledge of use of DME;Pain       PT Treatment Interventions      PT Goals (Current goals can be found in the Care Plan section)  Acute Rehab PT Goals Patient Stated Goal: to go home soon PT Goal Formulation: With patient Time For Goal Achievement: 08/11/16 Potential to Achieve Goals: Good    Frequency 7X/week    End of Session Equipment Utilized During Treatment: Gait belt Activity Tolerance: Patient limited by pain Patient left: in chair;with call bell/phone within reach Nurse Communication: Mobility status PT Visit Diagnosis: Other abnormalities of gait and mobility (R26.89);Pain Pain - Right/Left: Left Pain - part of body: Knee    Time: 0910-0953 PT Time Calculation (min) (ACUTE ONLY): 43 min   Charges:   PT Evaluation $PT Eval Low Complexity: 1 Procedure PT Treatments $Gait Training: 8-22 mins $Therapeutic Exercise: 8-22 mins   PT G Codes:   PT G-Codes **NOT FOR INPATIENT CLASS** Functional Assessment Tool Used: AM-PAC 6 Clicks Basic Mobility Functional Limitation: Mobility: Walking and moving around Mobility: Walking and Moving Around Current Status (H4174): At least 40 percent but less than 60 percent impaired, limited or restricted Mobility: Walking and Moving Around Goal Status [phone]): At least 1 percent but less than 20 percent impaired, limited or restricted    Benjamine Mola B. Migdalia Dk PT, DPT Acute Rehabilitation  [phone] Pager [phone]    [place] 08/04/2016, 12:05 PM

## 2016-08-04 NOTE — Care Management Obs Status (Signed)
[facility] NOTIFICATION   Patient Details  Name: Ashley Gomez MRN: [id number] Date of Birth: 1970/06/06   Medicare Observation Status Notification Given:  Yes    Ninfa Meeker, RN 08/04/2016, 12:48 PM

## 2016-08-07 ENCOUNTER — Telehealth (INDEPENDENT_AMBULATORY_CARE_PROVIDER_SITE_OTHER): Payer: Self-pay | Admitting: Orthopaedic Surgery

## 2016-08-07 NOTE — Telephone Encounter (Signed)
PT ASKED IF SHE NEEDS TO KEEP HER WOUND COVERED, SHE STATED SHE NEEDS TO SHOWER.  PLEASE ADVISE.   [phone]

## 2016-08-07 NOTE — Telephone Encounter (Signed)
PHYS THERAPIST FROM [facility] CALLED TO INFORM HE WANTS TO SEE PT FOR TWICE A WEEK FOR 6 WEEKS TO DO STRENGTHENING, RANGE OF MOTION, AND SAFETY AND FUCTIONAL MOBILITY.   NEEDS A CALL BACK REGARDING PLAN OF CARE  [phone]  ALSO WANTED TO INFORM ABOUT A MEDICATION REACTION, AND NEEDED AN ORDER A BENCH? PLEASE RETURN CALL.

## 2016-08-08 DIAGNOSIS — Z96652 Presence of left artificial knee joint: Secondary | ICD-10-CM | POA: Diagnosis not present

## 2016-08-08 DIAGNOSIS — Z471 Aftercare following joint replacement surgery: Secondary | ICD-10-CM | POA: Diagnosis not present

## 2016-08-08 DIAGNOSIS — G35 Multiple sclerosis: Secondary | ICD-10-CM | POA: Diagnosis not present

## 2016-08-08 DIAGNOSIS — I10 Essential (primary) hypertension: Secondary | ICD-10-CM | POA: Diagnosis not present

## 2016-08-08 DIAGNOSIS — I83813 Varicose veins of bilateral lower extremities with pain: Secondary | ICD-10-CM | POA: Diagnosis not present

## 2016-08-08 DIAGNOSIS — G894 Chronic pain syndrome: Secondary | ICD-10-CM | POA: Diagnosis not present

## 2016-08-08 NOTE — Telephone Encounter (Signed)
Yes she does

## 2016-08-08 NOTE — Telephone Encounter (Signed)
Please advise 

## 2016-08-08 NOTE — Telephone Encounter (Signed)
yes

## 2016-08-09 DIAGNOSIS — G894 Chronic pain syndrome: Secondary | ICD-10-CM | POA: Diagnosis not present

## 2016-08-09 DIAGNOSIS — I83813 Varicose veins of bilateral lower extremities with pain: Secondary | ICD-10-CM | POA: Diagnosis not present

## 2016-08-09 DIAGNOSIS — I10 Essential (primary) hypertension: Secondary | ICD-10-CM | POA: Diagnosis not present

## 2016-08-09 DIAGNOSIS — Z471 Aftercare following joint replacement surgery: Secondary | ICD-10-CM | POA: Diagnosis not present

## 2016-08-09 DIAGNOSIS — G35 Multiple sclerosis: Secondary | ICD-10-CM | POA: Diagnosis not present

## 2016-08-09 DIAGNOSIS — Z96652 Presence of left artificial knee joint: Secondary | ICD-10-CM | POA: Diagnosis not present

## 2016-08-09 MED ORDER — CYCLOBENZAPRINE HCL 5 MG PO TABS: ORAL_TABLET | ORAL | 0 refills | Status: DC

## 2016-08-09 NOTE — Addendum Note (Signed)
Addended by: Precious Bard on: 08/09/2016 12:46 PM   Modules accepted: Orders

## 2016-08-09 NOTE — Telephone Encounter (Signed)
Rx was sent to her Pharm

## 2016-08-09 NOTE — Telephone Encounter (Signed)
Give her flexeril 5 mg instead.  1-2 tabs po TID prn spasm.  #30

## 2016-08-09 NOTE — Telephone Encounter (Signed)
Called therapist to advise and he states Patient had reaction to medication celecoxib interacts with robaxin  continue taking it or ?  CB June

## 2016-08-09 NOTE — Telephone Encounter (Signed)
Faxed tub bench order to [phone]

## 2016-08-09 NOTE — Telephone Encounter (Signed)
Patient is having numbness on left side of knee. Is this normal. Has appt Thursday. She states she woke up like that.

## 2016-08-09 NOTE — Telephone Encounter (Signed)
normal

## 2016-08-09 NOTE — Telephone Encounter (Signed)
Called pt to advise

## 2016-08-10 ENCOUNTER — Encounter (INDEPENDENT_AMBULATORY_CARE_PROVIDER_SITE_OTHER): Payer: Self-pay | Admitting: Orthopaedic Surgery

## 2016-08-10 ENCOUNTER — Telehealth (INDEPENDENT_AMBULATORY_CARE_PROVIDER_SITE_OTHER): Payer: Self-pay

## 2016-08-10 ENCOUNTER — Ambulatory Visit (INDEPENDENT_AMBULATORY_CARE_PROVIDER_SITE_OTHER): Payer: Medicare Other | Admitting: Orthopaedic Surgery

## 2016-08-10 DIAGNOSIS — M1712 Unilateral primary osteoarthritis, left knee: Secondary | ICD-10-CM

## 2016-08-10 MED ORDER — OXYCODONE HCL 5 MG PO TABS: 5.0000 mg | ORAL_TABLET | ORAL | 0 refills | Status: DC | PRN

## 2016-08-10 NOTE — Telephone Encounter (Signed)
Patient called and states that  the pharm is saying that the Rx for Oxycodone is too early to be filled. What would you like to do. Would you like for me to call pharm to approve or deny Rx being filled. Thank you.

## 2016-08-10 NOTE — Telephone Encounter (Signed)
Called pt to let her know.

## 2016-08-10 NOTE — Progress Notes (Signed)
Patient is one-week status post unicompartmental knee arthroplasty. She is doing well overall with home health physical therapy. Range of motion is progressing. Incision is clean dry and intact. No signs of infection. She is taking oxycodone as needed. Patient is doing well at this point I'll see her back in approximately 2 weeks with recheck.

## 2016-08-10 NOTE — Telephone Encounter (Signed)
yes

## 2016-08-10 NOTE — Telephone Encounter (Signed)
Called pharm they are aware. Okay to fill Rx per Erlinda Hong

## 2016-08-11 DIAGNOSIS — G894 Chronic pain syndrome: Secondary | ICD-10-CM | POA: Diagnosis not present

## 2016-08-11 DIAGNOSIS — I10 Essential (primary) hypertension: Secondary | ICD-10-CM | POA: Diagnosis not present

## 2016-08-11 DIAGNOSIS — I83813 Varicose veins of bilateral lower extremities with pain: Secondary | ICD-10-CM | POA: Diagnosis not present

## 2016-08-11 DIAGNOSIS — Z471 Aftercare following joint replacement surgery: Secondary | ICD-10-CM | POA: Diagnosis not present

## 2016-08-11 DIAGNOSIS — Z96652 Presence of left artificial knee joint: Secondary | ICD-10-CM | POA: Diagnosis not present

## 2016-08-11 DIAGNOSIS — G35 Multiple sclerosis: Secondary | ICD-10-CM | POA: Diagnosis not present

## 2016-08-14 DIAGNOSIS — I83813 Varicose veins of bilateral lower extremities with pain: Secondary | ICD-10-CM | POA: Diagnosis not present

## 2016-08-14 DIAGNOSIS — Z471 Aftercare following joint replacement surgery: Secondary | ICD-10-CM | POA: Diagnosis not present

## 2016-08-14 DIAGNOSIS — I10 Essential (primary) hypertension: Secondary | ICD-10-CM | POA: Diagnosis not present

## 2016-08-14 DIAGNOSIS — Z96652 Presence of left artificial knee joint: Secondary | ICD-10-CM | POA: Diagnosis not present

## 2016-08-14 DIAGNOSIS — G894 Chronic pain syndrome: Secondary | ICD-10-CM | POA: Diagnosis not present

## 2016-08-14 DIAGNOSIS — G35 Multiple sclerosis: Secondary | ICD-10-CM | POA: Diagnosis not present

## 2016-08-15 ENCOUNTER — Telehealth (INDEPENDENT_AMBULATORY_CARE_PROVIDER_SITE_OTHER): Payer: Self-pay | Admitting: Orthopaedic Surgery

## 2016-08-15 NOTE — Telephone Encounter (Signed)
Patient called needing Rx refilled (Oxycodone) The number to contact patient is [phone]

## 2016-08-15 NOTE — Telephone Encounter (Signed)
Last Rx was given 08/10/16. LMOM

## 2016-08-16 ENCOUNTER — Other Ambulatory Visit (INDEPENDENT_AMBULATORY_CARE_PROVIDER_SITE_OTHER): Payer: Self-pay | Admitting: Physician Assistant

## 2016-08-16 ENCOUNTER — Telehealth (INDEPENDENT_AMBULATORY_CARE_PROVIDER_SITE_OTHER): Payer: Self-pay | Admitting: *Deleted

## 2016-08-16 DIAGNOSIS — Z471 Aftercare following joint replacement surgery: Secondary | ICD-10-CM | POA: Diagnosis not present

## 2016-08-16 DIAGNOSIS — I83813 Varicose veins of bilateral lower extremities with pain: Secondary | ICD-10-CM | POA: Diagnosis not present

## 2016-08-16 DIAGNOSIS — Z96652 Presence of left artificial knee joint: Secondary | ICD-10-CM | POA: Diagnosis not present

## 2016-08-16 DIAGNOSIS — I10 Essential (primary) hypertension: Secondary | ICD-10-CM | POA: Diagnosis not present

## 2016-08-16 DIAGNOSIS — G35 Multiple sclerosis: Secondary | ICD-10-CM | POA: Diagnosis not present

## 2016-08-16 DIAGNOSIS — G894 Chronic pain syndrome: Secondary | ICD-10-CM | POA: Diagnosis not present

## 2016-08-16 MED ORDER — OXYCODONE HCL 5 MG PO TABS: 5.0000 mg | ORAL_TABLET | ORAL | 0 refills | Status: DC | PRN

## 2016-08-16 NOTE — Telephone Encounter (Signed)
Patient called in this morning in regards to her pain medication that she needs to have refilled please. She has two Oxycodone pills left. Her CB # [phone]. Thank you.

## 2016-08-16 NOTE — Telephone Encounter (Signed)
Come and get RX

## 2016-08-16 NOTE — Telephone Encounter (Signed)
Rx ready for pick up. Caryl Pina put Rx  At the front desk.

## 2016-08-16 NOTE — Telephone Encounter (Signed)
Patient is status post unicompartmental knee arthroplasty (08/03/16). Her last Rf was on 08/10/16. Please advise if you can since Dr Erlinda Hong is out this week. Thank you.

## 2016-08-17 ENCOUNTER — Ambulatory Visit: Payer: Medicare Other | Admitting: Sports Medicine

## 2016-08-17 ENCOUNTER — Telehealth (INDEPENDENT_AMBULATORY_CARE_PROVIDER_SITE_OTHER): Payer: Self-pay | Admitting: Orthopaedic Surgery

## 2016-08-17 NOTE — Telephone Encounter (Signed)
Patient called advised Ashley Gomez called her back late day before yesterday and patient said she called back yesterday morning. Patient said she is out of her Oxycodone. The number to contact patient is [phone]

## 2016-08-17 NOTE — Telephone Encounter (Signed)
Ashley Gomez called her to let her know rx was ready for pick up yetserday

## 2016-08-18 DIAGNOSIS — I10 Essential (primary) hypertension: Secondary | ICD-10-CM | POA: Diagnosis not present

## 2016-08-18 DIAGNOSIS — G35 Multiple sclerosis: Secondary | ICD-10-CM | POA: Diagnosis not present

## 2016-08-18 DIAGNOSIS — I83813 Varicose veins of bilateral lower extremities with pain: Secondary | ICD-10-CM | POA: Diagnosis not present

## 2016-08-18 DIAGNOSIS — G894 Chronic pain syndrome: Secondary | ICD-10-CM | POA: Diagnosis not present

## 2016-08-18 DIAGNOSIS — Z471 Aftercare following joint replacement surgery: Secondary | ICD-10-CM | POA: Diagnosis not present

## 2016-08-18 DIAGNOSIS — Z96652 Presence of left artificial knee joint: Secondary | ICD-10-CM | POA: Diagnosis not present

## 2016-08-21 ENCOUNTER — Inpatient Hospital Stay (INDEPENDENT_AMBULATORY_CARE_PROVIDER_SITE_OTHER): Payer: Medicare Other | Admitting: Orthopaedic Surgery

## 2016-08-22 ENCOUNTER — Other Ambulatory Visit: Payer: Self-pay

## 2016-08-22 ENCOUNTER — Ambulatory Visit (INDEPENDENT_AMBULATORY_CARE_PROVIDER_SITE_OTHER): Payer: Medicare Other | Admitting: Licensed Clinical Social Worker

## 2016-08-22 DIAGNOSIS — Z96652 Presence of left artificial knee joint: Secondary | ICD-10-CM | POA: Diagnosis not present

## 2016-08-22 DIAGNOSIS — F341 Dysthymic disorder: Secondary | ICD-10-CM | POA: Diagnosis not present

## 2016-08-22 DIAGNOSIS — I10 Essential (primary) hypertension: Secondary | ICD-10-CM | POA: Diagnosis not present

## 2016-08-22 DIAGNOSIS — F4329 Adjustment disorder with other symptoms: Secondary | ICD-10-CM

## 2016-08-22 DIAGNOSIS — G35 Multiple sclerosis: Secondary | ICD-10-CM | POA: Diagnosis not present

## 2016-08-22 DIAGNOSIS — F4310 Post-traumatic stress disorder, unspecified: Secondary | ICD-10-CM

## 2016-08-22 DIAGNOSIS — Z634 Disappearance and death of family member: Secondary | ICD-10-CM | POA: Diagnosis not present

## 2016-08-22 DIAGNOSIS — I83813 Varicose veins of bilateral lower extremities with pain: Secondary | ICD-10-CM | POA: Diagnosis not present

## 2016-08-22 DIAGNOSIS — F4321 Adjustment disorder with depressed mood: Secondary | ICD-10-CM

## 2016-08-22 DIAGNOSIS — Z471 Aftercare following joint replacement surgery: Secondary | ICD-10-CM | POA: Diagnosis not present

## 2016-08-22 DIAGNOSIS — G894 Chronic pain syndrome: Secondary | ICD-10-CM | POA: Diagnosis not present
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
## 2016-08-24 ENCOUNTER — Ambulatory Visit (INDEPENDENT_AMBULATORY_CARE_PROVIDER_SITE_OTHER): Payer: Medicare Other | Admitting: Orthopaedic Surgery

## 2016-08-24 ENCOUNTER — Encounter (INDEPENDENT_AMBULATORY_CARE_PROVIDER_SITE_OTHER): Payer: Self-pay | Admitting: Orthopaedic Surgery

## 2016-08-24 DIAGNOSIS — I83813 Varicose veins of bilateral lower extremities with pain: Secondary | ICD-10-CM | POA: Diagnosis not present

## 2016-08-24 DIAGNOSIS — Z96652 Presence of left artificial knee joint: Secondary | ICD-10-CM | POA: Diagnosis not present

## 2016-08-24 DIAGNOSIS — G35 Multiple sclerosis: Secondary | ICD-10-CM | POA: Diagnosis not present

## 2016-08-24 DIAGNOSIS — M1712 Unilateral primary osteoarthritis, left knee: Secondary | ICD-10-CM

## 2016-08-24 DIAGNOSIS — G894 Chronic pain syndrome: Secondary | ICD-10-CM | POA: Diagnosis not present

## 2016-08-24 DIAGNOSIS — Z471 Aftercare following joint replacement surgery: Secondary | ICD-10-CM | POA: Diagnosis not present

## 2016-08-24 DIAGNOSIS — I10 Essential (primary) hypertension: Secondary | ICD-10-CM | POA: Diagnosis not present

## 2016-08-24 MED ORDER — OXYCODONE HCL 5 MG PO TABS: 5.0000 mg | ORAL_TABLET | Freq: Two times a day (BID) | ORAL | 0 refills | Status: DC | PRN

## 2016-08-24 NOTE — Progress Notes (Signed)
Ashley Gomez is 3 weeks status post left unicompartmental arthroplasty. She has pain mainly at night. She is coming along with physical therapy. She has finished home health physical therapy. Her range of motion is from 0 to about 100. Her surgical scar has healed. There is no signs of infection. Minimal swelling. Patient is progressing appropriately. I do think that she has some form of chronic pain syndrome. I refilled her oxycodone today for her to take mainly at night. I want her to start outpatient physical therapy. I'll see her back in 3 weeks with 2 view x-rays of the left knee

## 2016-08-29 ENCOUNTER — Encounter: Payer: Self-pay | Admitting: Family Medicine

## 2016-08-29 ENCOUNTER — Ambulatory Visit (INDEPENDENT_AMBULATORY_CARE_PROVIDER_SITE_OTHER): Payer: Medicare Other | Admitting: Family Medicine

## 2016-08-29 ENCOUNTER — Telehealth: Payer: Self-pay | Admitting: *Deleted

## 2016-08-29 VITALS — BP 132/77 | HR 91 | Ht 67.0 in | Wt 265.0 lb

## 2016-08-29 DIAGNOSIS — M170 Bilateral primary osteoarthritis of knee: Secondary | ICD-10-CM | POA: Diagnosis not present

## 2016-08-29 DIAGNOSIS — Z96652 Presence of left artificial knee joint: Secondary | ICD-10-CM | POA: Diagnosis not present

## 2016-08-29 DIAGNOSIS — R7301 Impaired fasting glucose: Secondary | ICD-10-CM | POA: Diagnosis not present

## 2016-08-29 DIAGNOSIS — M791 Myalgia: Secondary | ICD-10-CM | POA: Diagnosis not present

## 2016-08-29 DIAGNOSIS — M5416 Radiculopathy, lumbar region: Secondary | ICD-10-CM

## 2016-08-29 DIAGNOSIS — G8929 Other chronic pain: Secondary | ICD-10-CM | POA: Diagnosis not present

## 2016-08-29 DIAGNOSIS — M7918 Myalgia, other site: Secondary | ICD-10-CM

## 2016-08-29 MED ORDER — HYDROCODONE-ACETAMINOPHEN 7.5-325 MG PO TABS: 1.0000 | ORAL_TABLET | Freq: Three times a day (TID) | ORAL | 0 refills | Status: DC | PRN

## 2016-08-29 NOTE — Telephone Encounter (Signed)
Persida the pharmacist at [facility] called to inform us that pt is currently taking oxycodone 5 mg #40 1-2 tabs bid prn (15 day supply) she filled this on 08/24/16 by Dr. Erlinda Hong with [facility] ortho.  I advised her to hold pt's prescription until 09/07/2016 which will be the end of the prescription. Will fwd to pcp for review printed copy of [place] controlled substance report.Audelia Hives [place]

## 2016-08-29 NOTE — Progress Notes (Signed)
Subjective:    Patient ID: Ashley Gomez, female    DOB: 1970/05/24, 46 y.o.   MRN: [id number]  HPI  follow-up for chronic pain management. She has chronic low back pain and bilateral knee pain. She did have left knee replacement done on March 22. He currently takes 3 hydrocodone a day. She is also on gabapentin and uses Flexeril as needed. Also on Cymbalta. She said she did have a lot of pain right after her surgery. In fact she said she probably never have knee replacement done again because of that. She is currently staying with her daughter. Her disability filing with her about 3 months ago so she is hoping to move out in the next couple months and find her own place. Living with her daughter has been extremely stressful as it is not a supportive relationship. She was given a short supply of oxycodone at discharge from the hospital but says it really wasn't effective and is no longer taking it. She still is on the blood thinner for 6 more days. She saw the orthopedist of the day and he said that she could go ahead and retry the Robaxin. She also still has some Flexeril.   Review of Systems   BP 132/77   Pulse 91   Ht 5\' 7"  (1.702 m)   Wt 265 lb (120.2 kg)   LMP 12/28/2014   SpO2 100%   BMI 41.50 kg/m     Allergies  Allergen Reactions  . Ace Inhibitors     REACTION: cough  . Aspirin Other (See Comments)    Stomach Ulcer  . Atenolol Other (See Comments)    Bradycardia  . Celexa [Citalopram Hydrobromide] Other (See Comments)    Dizziness Tolerated Lexapro   . Codeine Nausea Only  . Nitroglycerin Other (See Comments)    Drop in BP  . Phenytoin Swelling  . Sertraline Other (See Comments)    unknown  . Triamcinolone     Steroid flare after joint injection, use other steroid for injections    Past Medical History:  Diagnosis Date  . Anxiety   . Arthritis   . CVA (cerebral infarction)    Old CVA on MRI brain  . Depression   . DVT (deep venous thrombosis) ([place])  58/83/2549   L basilic vein   . Facet hypertrophy of lumbar region    MRI 2007  . Fibromyalgia   . GERD (gastroesophageal reflux disease)   . Hypertension    no meds now, lost 50lbs  . Migraines   . MS (multiple sclerosis) ([place])   . Neuromuscular disorder ([place])   . Obesity   . PTSD (post-traumatic stress disorder)   . Seizures ([place]) 2011   no seizure since onset  . Sleep apnea    diagnosed 20 ago, lost wt no CPAP now  . Smoking   . Stroke [facility])    doesn't know when    Past Surgical History:  Procedure Laterality Date  . CHONDROPLASTY Left 12/30/2014   Procedure: CHONDROPLASTY;  Surgeon: Leandrew Koyanagi, MD;  Location: [facility];  Service: Orthopedics;  Laterality: Left;  . KNEE ARTHROSCOPY Left 12/30/2014   Procedure: LEFT KNEE ARTHROSCOPY WITH   CHONDROPLASTY;  Surgeon: Leandrew Koyanagi, MD;  Location: [facility];  Service: Orthopedics;  Laterality: Left;  . PARTIAL KNEE ARTHROPLASTY Left 08/03/2016   Procedure: LEFT UNICOMPARTMENTAL KNEE ARTHROPLASTY;  Surgeon: Leandrew Koyanagi, MD;  Location: [facility];  Service: Orthopedics;  Laterality:  Left;  . TONSILLECTOMY    . TUBAL LIGATION  1992    Social History   Social History  . Marital status: Single    Spouse name: N/A  . Number of children: N/A  . Years of education: N/A   Occupational History  . Not on file.   Social History Main Topics  . Smoking status: Current Every Day Smoker    Packs/day: 1.00    Years: 25.00    Types: Cigarettes  . Smokeless tobacco: Never Used     Comment: advised to d/c by 3 cigs per week.  . Alcohol use No     Comment: occasional  . Drug use: No  . Sexual activity: Yes    Birth control/ protection: None, Post-menopausal   Other Topics Concern  . Not on file   Social History Narrative  . No narrative on file    Family History  Problem Relation Age of Onset  . Cancer Mother 77    melanoma  . Hypertension Sister   . Heart disease Sister     valve disease  .  Depression Sister   . Diabetes Sister   . Sjogren's syndrome Sister     Outpatient Encounter Prescriptions as of 08/29/2016  Medication Sig  . acetaminophen (TYLENOL) 500 MG tablet Take 1,000 mg by mouth every 6 (six) hours as needed for mild pain or headache.  . ARIPiprazole (ABILIFY) 10 MG tablet Take 1 tablet (10 mg total) by mouth daily.  . celecoxib (CELEBREX) 200 MG capsule One to 2 tablets by mouth daily as needed for pain. (Patient not taking: Reported on 08/10/2016)  . clonazePAM (KLONOPIN) 1 MG tablet Take 1 tablet (1 mg total) by mouth 3 (three) times daily as needed for anxiety. NO EARLY REFILLS (REFILL 11/28;05/11/16; 1/27 2018)  . DULoxetine (CYMBALTA) 60 MG capsule Take 1 capsule (60 mg total) by mouth daily.  Marland Kitchen gabapentin (NEURONTIN) 400 MG capsule Take 2-3 caps in am 2-3 caps in pm and 2-3 caps HS  . hydrochlorothiazide (HYDRODIURIL) 25 MG tablet take 1 tablet by mouth once daily if needed  . HYDROcodone-acetaminophen (NORCO) 7.5-325 MG tablet Take 1 tablet by mouth every 8 (eight) hours as needed for moderate pain.  . metFORMIN (GLUCOPHAGE) 500 MG tablet Take 1 tablet (500 mg total) by mouth 2 (two) times daily with a meal.  . methocarbamol (ROBAXIN) 750 MG tablet Take 1 tablet (750 mg total) by mouth 2 (two) times daily as needed for muscle spasms. (Patient not taking: Reported on 08/10/2016)  . omeprazole (PRILOSEC) 40 MG capsule Take 1 capsule (40 mg total) by mouth daily.  . ondansetron (ZOFRAN) 4 MG tablet Take 1-2 tablets (4-8 mg total) by mouth every 8 (eight) hours as needed for nausea or vomiting.  . promethazine (PHENERGAN) 25 MG tablet Take 1 tablet (25 mg total) by mouth every 6 (six) hours as needed for nausea.  . rivaroxaban (XARELTO) 10 MG TABS tablet Take 1 tablet (10 mg total) by mouth daily.  Marland Kitchen senna-docusate (SENOKOT S) 8.6-50 MG tablet Take 1 tablet by mouth at bedtime as needed.  . SUMAtriptan (IMITREX) 100 MG tablet Take 1 tablet (100 mg total) by mouth  every 2 (two) hours as needed for migraine. No more than 2 in 24 hours.  . topiramate (TOPAMAX) 100 MG tablet Take 1 tablet in am and 2 tablets in PM  . [DISCONTINUED] cyclobenzaprine (FLEXERIL) 10 MG tablet Take 1 tablet (10 mg total) by mouth at bedtime as needed  for muscle spasms.  . [DISCONTINUED] cyclobenzaprine (FLEXERIL) 5 MG tablet 1-2 tabs po TID prn spasm  . [DISCONTINUED] HYDROcodone-acetaminophen (NORCO) 7.5-325 MG tablet Take 1 tablet by mouth every 8 (eight) hours as needed for moderate pain. Ok to fill on or after 07/28/2016.  . [DISCONTINUED] metroNIDAZOLE (FLAGYL) 500 MG tablet Take 1 tablet (500 mg total) by mouth 2 (two) times daily. (Patient not taking: Reported on 08/24/2016)  . [DISCONTINUED] oxyCODONE (OXY IR/ROXICODONE) 5 MG immediate release tablet Take 1-2 tablets (5-10 mg total) by mouth 2 (two) times daily as needed.   No facility-administered encounter medications on file as of 08/29/2016.           Objective:   Physical Exam  Constitutional: She is oriented to person, place, and time. She appears well-developed and well-nourished.  HENT:  Head: Normocephalic and atraumatic.  Cardiovascular: Normal rate, regular rhythm and normal heart sounds.   Pulmonary/Chest: Effort normal and breath sounds normal.  Neurological: She is alert and oriented to person, place, and time.  Skin: Skin is warm and dry.  Psychiatric: She has a normal mood and affect. Her behavior is normal.       Assessment & Plan:  Status post left knee replacement-okay to take Robaxin or Flexeril but do not mix them. Certainly if she has one that she prefers we can stick to that one for future refills. Now she is doing okay on medication. We'll go ahead and refill her hydrocodone today. New perception printed and given.  Chronic back pain-encouraged her to wait 2 weeks before restarting her Celebrex. Since she is still completing her blood thinner regimen.  Chronic pain management had  not-updated pain contract today and urine drug screen performed.We did check the mat [facility] registry. During office visit she had said she only filled one prescription for oxycodone right after discharge and felt like it wasn't helping and didn't want to take it and was not actively taking it. But upon review of the [facility] registry and after speaking with the pharmacist she actually filled several prescriptions for the oxycodone from the orthopedic office. She should actually have enough pain medicine until April 28 based on their prescription. In addition the hydrocodone that we gave her in the middle of March she would really have only used about 8 days of medication before she had her knee surgery says theoretically she should have some medication left over the it sounds like she's probably been combining both of them. We did go ahead and send a prescription but she is not allowed to fill it until April 28. Follow-up in 3 months.  IFG - last A1C was in March 2018.  Repeat in 6 months.

## 2016-08-30 ENCOUNTER — Telehealth: Payer: Self-pay

## 2016-08-30 NOTE — Telephone Encounter (Signed)
Pt called pharmacy for hydrocodone.  Pharmacy denied it.  She had oxycodone filled by Dr Erlinda Hong on 4/12 40 tabs, and Dr Carlis Abbott on 4/6 for 40 tabs.  They said the earliest they would fill would be 4/25.  Please advise as patient said she only has 1 left and needs medication.

## 2016-08-30 NOTE — Telephone Encounter (Signed)
Ashley Gomez was working on this.

## 2016-08-30 NOTE — Telephone Encounter (Signed)
Spoke with pt and informed her that she cannot p/u the medication until 4/28. She stated that she understood and that the prescription for the oxycodone that she was getting from the ortho was the last one bc it wasn't working for her.Ashley Gomez, [place] Crocker

## 2016-08-31 DIAGNOSIS — M25562 Pain in left knee: Secondary | ICD-10-CM | POA: Diagnosis not present

## 2016-08-31 DIAGNOSIS — G894 Chronic pain syndrome: Secondary | ICD-10-CM | POA: Diagnosis not present

## 2016-08-31 LAB — DRUG ABUSE PANEL 10-50 NO CONF, U
AMPHETAMINES (1000 ng/mL SCRN): NEGATIVE
BARBITURATES: NEGATIVE
BENZODIAZEPINES: NEGATIVE
COCAINE METABOLITES: NEGATIVE
MARIJUANA MET (50 ng/mL SCRN): NEGATIVE
METHADONE: NEGATIVE
METHAQUALONE: NEGATIVE
OPIATES: NEGATIVE
PHENCYCLIDINE: NEGATIVE
PROPOXYPHENE: NEGATIVE

## 2016-09-04 LAB — PAIN MGMT, PROFILE 6 CONF W/O MM, U
6 Acetylmorphine: NEGATIVE ng/mL (ref <10)
Alcohol Metabolites: NEGATIVE ng/mL (ref <500)
Alphahydroxyalprazolam: NEGATIVE ng/mL (ref <25)
Alphahydroxymidazolam: NEGATIVE ng/mL (ref <50)
Alphahydroxytriazolam: NEGATIVE ng/mL (ref <50)
Aminoclonazepam: 170 ng/mL — ABNORMAL HIGH (ref <25)
Amphetamines: NEGATIVE ng/mL (ref <500)
Barbiturates: NEGATIVE ng/mL (ref <300)
Benzodiazepines: POSITIVE ng/mL — AB (ref <100)
Cocaine Metabolite: NEGATIVE ng/mL (ref <150)
Creatinine: 26 mg/dL (ref >=20.0)
Hydroxyethylflurazepam: NEGATIVE ng/mL (ref <50)
Lorazepam: NEGATIVE ng/mL (ref <50)
Marijuana Metabolite: NEGATIVE ng/mL (ref <20)
Methadone Metabolite: NEGATIVE ng/mL (ref <100)
Nordiazepam: NEGATIVE ng/mL (ref <50)
Opiates: NEGATIVE ng/mL (ref <100)
Oxazepam: NEGATIVE ng/mL (ref <50)
Oxidant: NEGATIVE ug/mL (ref <200)
Oxycodone: NEGATIVE ng/mL (ref <100)
Phencyclidine: NEGATIVE ng/mL (ref <25)
Please note:: 0
Temazepam: NEGATIVE ng/mL (ref <50)
pH: 6.44 (ref 4.5–9.0)

## 2016-09-05 NOTE — Addendum Note (Signed)
Addended by: Teddy Spike on: 09/05/2016 06:55 PM   Modules accepted: Orders

## 2016-09-05 NOTE — Telephone Encounter (Signed)
Called pt's pharmacy and spoke w\ [name] to notify them NOT TO DISPENSE HER RX FOR  HYDROCODONE. As per further investigation she is NOT eligible for this prescription. Ashley Gomez, Lahoma Crocker

## 2016-09-05 NOTE — Telephone Encounter (Signed)
I sent a staff message to Dr. Erlinda Hong and his assistant Brunilda Payor informing them of the recent findings of her UDS. Ashley Gomez, Lahoma Crocker

## 2016-09-14 ENCOUNTER — Encounter (INDEPENDENT_AMBULATORY_CARE_PROVIDER_SITE_OTHER): Payer: Self-pay | Admitting: Orthopaedic Surgery

## 2016-09-14 ENCOUNTER — Ambulatory Visit (INDEPENDENT_AMBULATORY_CARE_PROVIDER_SITE_OTHER): Payer: Medicare Other | Admitting: Orthopaedic Surgery

## 2016-09-14 ENCOUNTER — Ambulatory Visit (INDEPENDENT_AMBULATORY_CARE_PROVIDER_SITE_OTHER): Payer: Medicare Other

## 2016-09-14 DIAGNOSIS — M25562 Pain in left knee: Secondary | ICD-10-CM

## 2016-09-14 DIAGNOSIS — G8929 Other chronic pain: Secondary | ICD-10-CM

## 2016-09-14 MED ORDER — HYDROCODONE-ACETAMINOPHEN 7.5-325 MG PO TABS: 1.0000 | ORAL_TABLET | Freq: Two times a day (BID) | ORAL | 0 refills | Status: DC | PRN

## 2016-09-14 NOTE — Progress Notes (Signed)
Patient is 6 weeks status post left unicompartmental arthroplasty. He is doing well. She complains of worsening swelling and throbbing pain in the day. Overall she is progressing with therapy she is happy with how she is done. She continues to physical therapy. I did refill her Norco. I want to see her back in 6 weeks for recheck. No x-rays needed. She already is in pain management clinic. She has range of motion from 0 to about 95. Her flexion is limited by her thigh.

## 2016-09-15 ENCOUNTER — Telehealth (INDEPENDENT_AMBULATORY_CARE_PROVIDER_SITE_OTHER): Payer: Self-pay | Admitting: Radiology

## 2016-09-15 ENCOUNTER — Other Ambulatory Visit (HOSPITAL_BASED_OUTPATIENT_CLINIC_OR_DEPARTMENT_OTHER): Payer: Self-pay

## 2016-09-15 DIAGNOSIS — M79605 Pain in left leg: Secondary | ICD-10-CM

## 2016-09-15 NOTE — Telephone Encounter (Signed)
I contacted [facility] Ashley Gomez and because of pt insurance they can not scheudle her, pt is scheduled at [facility] for later today. Pt is aware

## 2016-09-15 NOTE — Telephone Encounter (Signed)
Order was made can you get her an ASAP Appt.  [name] to r/o DVT at [facility] per pt. I called you but I assume your at lunch so I sent you message instead. Thank you!

## 2016-09-15 NOTE — Addendum Note (Signed)
Addended by: Precious Bard on: 09/15/2016 12:03 PM   Modules accepted: Orders

## 2016-09-15 NOTE — Telephone Encounter (Signed)
Yes thanks. Order please

## 2016-09-15 NOTE — Telephone Encounter (Signed)
SEE MESSAGE BELOW:

## 2016-09-15 NOTE — Telephone Encounter (Signed)
Patient called and said that she has 3 x normal swelling in the leg/foot/ankle since yesterday am appt.  She is concerned for DVT.  She has h/o DVT that leg.  She wants to go have an [name] to r/o DVT at [facility].  Please call her ASAP.

## 2016-09-16 ENCOUNTER — Ambulatory Visit (HOSPITAL_BASED_OUTPATIENT_CLINIC_OR_DEPARTMENT_OTHER)
Admission: RE | Admit: 2016-09-16 | Discharge: 2016-09-16 | Disposition: A | Discharge status: Home / Self Care | Payer: Medicare Other | Source: Ambulatory Visit | Attending: Orthopaedic Surgery | Admitting: Orthopaedic Surgery

## 2016-09-16 DIAGNOSIS — M79605 Pain in left leg: Secondary | ICD-10-CM | POA: Diagnosis not present

## 2016-09-16 DIAGNOSIS — M7122 Synovial cyst of popliteal space [Baker], left knee: Secondary | ICD-10-CM | POA: Diagnosis not present

## 2016-09-18 ENCOUNTER — Telehealth (INDEPENDENT_AMBULATORY_CARE_PROVIDER_SITE_OTHER): Payer: Self-pay | Admitting: Radiology

## 2016-09-18 NOTE — Telephone Encounter (Addendum)
Ashley Gomez is calling to get her U/s results that was done on Saturday.  Please call her back.  Thanks.-------Patient has been advised that results are negative per Dr. Juleen Starr she wants to know why it is swelling and would like a call back regarding this.

## 2016-09-18 NOTE — Telephone Encounter (Signed)
I think Ashley Gomez already asked me about this.  It was negative for DVT

## 2016-09-18 NOTE — Telephone Encounter (Signed)
Please advise 

## 2016-09-27 ENCOUNTER — Other Ambulatory Visit (INDEPENDENT_AMBULATORY_CARE_PROVIDER_SITE_OTHER): Payer: Self-pay | Admitting: Radiology

## 2016-09-27 NOTE — Telephone Encounter (Signed)
Patient called needing refill on her pain meds.

## 2016-09-27 NOTE — Telephone Encounter (Signed)
See message below °

## 2016-09-28 ENCOUNTER — Ambulatory Visit (INDEPENDENT_AMBULATORY_CARE_PROVIDER_SITE_OTHER): Payer: Medicare Other | Admitting: Licensed Clinical Social Worker

## 2016-09-28 ENCOUNTER — Other Ambulatory Visit: Payer: Self-pay | Admitting: Family Medicine

## 2016-09-28 ENCOUNTER — Telehealth (INDEPENDENT_AMBULATORY_CARE_PROVIDER_SITE_OTHER): Payer: Self-pay | Admitting: Orthopaedic Surgery

## 2016-09-28 DIAGNOSIS — Z1239 Encounter for other screening for malignant neoplasm of breast: Secondary | ICD-10-CM

## 2016-09-28 DIAGNOSIS — F4310 Post-traumatic stress disorder, unspecified: Secondary | ICD-10-CM

## 2016-09-28 DIAGNOSIS — F341 Dysthymic disorder: Secondary | ICD-10-CM

## 2016-09-28 DIAGNOSIS — Z634 Disappearance and death of family member: Secondary | ICD-10-CM

## 2016-09-28 DIAGNOSIS — F4329 Adjustment disorder with other symptoms: Secondary | ICD-10-CM

## 2016-09-28 DIAGNOSIS — F4321 Adjustment disorder with depressed mood: Secondary | ICD-10-CM

## 2016-09-28 MED ORDER — HYDROCODONE-ACETAMINOPHEN 7.5-325 MG PO TABS: 1.0000 | ORAL_TABLET | Freq: Two times a day (BID) | ORAL | 0 refills | Status: DC | PRN

## 2016-09-28 NOTE — Telephone Encounter (Signed)
rx picked up already

## 2016-09-28 NOTE — Telephone Encounter (Signed)
Patient called advised she spoke with someone yesterday requesting refill on Rx (Hydrocodone) Patient did not know who she spoke with.  Patient asked for a call back when Rx is ready to be picked up. The number to contact patient is [phone]

## 2016-09-28 NOTE — Telephone Encounter (Signed)
CALLED PT RX READY FOR PICKUP, PT AWARE. LMOM

## 2016-09-28 NOTE — Telephone Encounter (Signed)
Refill #30
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
## 2016-10-03 ENCOUNTER — Other Ambulatory Visit: Payer: Self-pay | Admitting: *Deleted

## 2016-10-03 ENCOUNTER — Ambulatory Visit (HOSPITAL_COMMUNITY): Payer: Self-pay | Admitting: Licensed Clinical Social Worker

## 2016-10-03 DIAGNOSIS — N83201 Unspecified ovarian cyst, right side: Secondary | ICD-10-CM

## 2016-10-10 ENCOUNTER — Ambulatory Visit (HOSPITAL_COMMUNITY): Payer: Self-pay | Admitting: Licensed Clinical Social Worker

## 2016-10-12 ENCOUNTER — Ambulatory Visit (INDEPENDENT_AMBULATORY_CARE_PROVIDER_SITE_OTHER): Payer: Medicare Other | Admitting: Medical

## 2016-10-12 ENCOUNTER — Telehealth (INDEPENDENT_AMBULATORY_CARE_PROVIDER_SITE_OTHER): Payer: Self-pay

## 2016-10-12 ENCOUNTER — Encounter (HOSPITAL_COMMUNITY): Payer: Self-pay | Admitting: Medical

## 2016-10-12 VITALS — BP 124/74 | HR 88 | Resp 16 | Ht 67.0 in | Wt 264.0 lb

## 2016-10-12 DIAGNOSIS — G894 Chronic pain syndrome: Secondary | ICD-10-CM | POA: Diagnosis not present

## 2016-10-12 DIAGNOSIS — Z7984 Long term (current) use of oral hypoglycemic drugs: Secondary | ICD-10-CM

## 2016-10-12 DIAGNOSIS — F4321 Adjustment disorder with depressed mood: Secondary | ICD-10-CM

## 2016-10-12 DIAGNOSIS — F4310 Post-traumatic stress disorder, unspecified: Secondary | ICD-10-CM

## 2016-10-12 DIAGNOSIS — Z79891 Long term (current) use of opiate analgesic: Secondary | ICD-10-CM | POA: Diagnosis not present

## 2016-10-12 DIAGNOSIS — Z79899 Other long term (current) drug therapy: Secondary | ICD-10-CM

## 2016-10-12 DIAGNOSIS — Z818 Family history of other mental and behavioral disorders: Secondary | ICD-10-CM

## 2016-10-12 DIAGNOSIS — Z888 Allergy status to other drugs, medicaments and biological substances status: Secondary | ICD-10-CM | POA: Diagnosis not present

## 2016-10-12 DIAGNOSIS — F607 Dependent personality disorder: Secondary | ICD-10-CM

## 2016-10-12 DIAGNOSIS — F331 Major depressive disorder, recurrent, moderate: Secondary | ICD-10-CM

## 2016-10-12 DIAGNOSIS — F17210 Nicotine dependence, cigarettes, uncomplicated: Secondary | ICD-10-CM

## 2016-10-12 DIAGNOSIS — F411 Generalized anxiety disorder: Secondary | ICD-10-CM | POA: Diagnosis not present

## 2016-10-12 DIAGNOSIS — Z886 Allergy status to analgesic agent status: Secondary | ICD-10-CM | POA: Diagnosis not present

## 2016-10-12 MED ORDER — CLONAZEPAM 1 MG PO TABS: 1.0000 mg | ORAL_TABLET | Freq: Three times a day (TID) | ORAL | 2 refills | Status: DC | PRN

## 2016-10-12 MED ORDER — PREGABALIN 100 MG PO CAPS: 100.0000 mg | ORAL_CAPSULE | Freq: Three times a day (TID) | ORAL | 2 refills | Status: DC

## 2016-10-12 MED ORDER — DULOXETINE HCL 60 MG PO CPEP: 60.0000 mg | ORAL_CAPSULE | Freq: Every day | ORAL | 3 refills | Status: DC

## 2016-10-12 MED ORDER — HYDROCODONE-ACETAMINOPHEN 7.5-325 MG PO TABS
ORAL_TABLET | ORAL | 0 refills | Status: DC
Start: 2016-10-12 — End: 2016-10-30

## 2016-10-12 MED ORDER — ARIPIPRAZOLE 10 MG PO TABS: 10.0000 mg | ORAL_TABLET | Freq: Every day | ORAL | 2 refills | Status: DC

## 2016-10-12 MED ORDER — TOPIRAMATE 100 MG PO TABS: ORAL_TABLET | ORAL | 2 refills | Status: DC

## 2016-10-12 NOTE — Telephone Encounter (Signed)
Rx printed pending signature will be ready for pick up tomorrow morning.

## 2016-10-12 NOTE — Telephone Encounter (Signed)
Didn't I just give her one recently

## 2016-10-12 NOTE — Telephone Encounter (Signed)
Patient would like a Rx refill on Hydrocodone.  CB# is [phone].

## 2016-10-12 NOTE — Progress Notes (Signed)
BH MD/PA/NP OP Progress Note  10/12/2016 1:45 PM Ashley Gomez  MRN:  [id number]  Chief Complaint:  Chief Complaint    Follow-up; Stress; Trauma; Anxiety; Depression; Family Problem     Subjective: "Can I get an increase in my Klonopin ?-My anxiety has gone up since I moved in by myself" HPI: Pt returns for scheduled FU and reorts being stable except for her anxiety.Her daugther has made her move to a place of her own and she is having some trouble adjusting. She hasnt been able to be with/see her Grandchildren as much.She is following with her Orthopedic/Pain specialists.She says she wasnt aware Klonopin could be addictive? She mentions today taht in addition to her orthopedic issues she has Fibromyalgia and"MS" ( MS has never been documented) Visit Diagnosis:    ICD-9-CM ICD-10-CM   1. PTSD (post-traumatic stress disorder) 309.81 F43.10   2. Depression, major, recurrent, moderate (HCC) 296.32 F33.1 DULoxetine (CYMBALTA) 60 MG capsule  3. Anxiety state 300.00 F41.1   4. Chronic pain syndrome 338.4 G89.4   5. Morbid obesity, unspecified obesity type ([place]) 278.01 E66.01   6. Dependent personality disorder 301.6 F60.7     Past Psychiatric History:  Session Time: 1 PM to 1:55 PM Participation Level: Activ Behavioral Response: CasualAlertBlunte Type of Therapy: Individual Therapy Treatment Goals addressed: decrease in depression, anxiety, stress management and work through grief issues Interventions: Solution Focused, Strength-based, Supportive, Reframing and Other: Grief related interventions Summary: Ashley Gomez is a 46 y.o. female who who updated therapist on new developments and said she is moved into a new place. It is nice but doesn't like being there by herself so much. She is able to find ways to spend her time and find things to do in the day. One of the focuses is fixing up her place. She relates its getting use to change and use to having the kids all around all the time  and doing things to help in taking care of them. She recognizes that change is hard but can be good. She is able to see the positives of offering her opportunities, freedom and describes similar and nice features of her new home. Discussed ways she could have more interaction including talking to boyfriend about being around more. She relates that she is okay with him not being aroud more. Knew is doing well, she is a lot more mobile and doing a a lot of walking and bending. She does have a to go as far as full recovery from surgery. Describes summer as better for her as pain is not as bad now being referred by her doctor to pain clinic. Discussed how she will be opening boxes that will have pictures and things left by her daughter who is passed. Patient related opening one boxing glancing at it but not ready to do more. She plans to make a memory book for her grandchildren. Describes thinking about her daughter every day and through the day. She still have thoughts of wondering how things would have been with grandchild, Janan Halter, wondering what if things have been different and questioning why still. Relates struggling with whether she blames, [name], the driver, and as discussed further with therapist she admits to having anger toward her. She feels bad at the same time because she died and left a family who is mourning. Relates not crying as much. Shared with therapist other ve news that son is going to have a baby in December and shared her feelings including not being  sure how she feels about it.  Suicidal/Homicidal: No Therapist Response: Reviewed patient's progress and symptoms as therapist has not seen patient since April. Reviewed positive developments in her life and work with her on reframing to manage change and then not being used to being on her own so much. Helped her process feelings around being on her own more and engaged in problem solving to find ways that would work best for her in increasing  interactions with others. Encouraged her to see this time for herself as something positive for herself as well. Discussed including exercise as part of her treatment plan and overall well-being. Work with patient on processing feelings of grief, validated patient on feelings of anger, normalize some of her experience of grief and work with her on understanding of limited control in helping her to work through her grief. Provided supportive and strength-based interventions.   Plan: Return again in 1 weeks.2. Help patient process feelings around stressors and helping her in building insight to effective coping strategies.3. Continue to implement grief related strategies Diagnosis:      Axis I:  Post Traumatic Stress Disorder and Complicated grief, persistent depressive disorder                          Axis II: No diagnosis Cordella Register, LCSW 09/28/2016  Past Medical History:  Past Medical History:  Diagnosis Date  . Anxiety   . Arthritis   . CVA (cerebral infarction)    Old CVA on MRI brain  . Depression   . DVT (deep venous thrombosis) ([place]) 35/57/3220   L basilic vein   . Facet hypertrophy of lumbar region    MRI 2007  . Fibromyalgia   . GERD (gastroesophageal reflux disease)   . Hypertension    no meds now, lost 50lbs  . Migraines   . MS (multiple sclerosis) ([place])   . Neuromuscular disorder ([place])   . Obesity   . PTSD (post-traumatic stress disorder)   . Seizures ([place]) 2011   no seizure since onset  . Sleep apnea    diagnosed 20 ago, lost wt no CPAP now  . Smoking   . Stroke [facility])    doesn't know when    Past Surgical History:  Procedure Laterality Date  . CHONDROPLASTY Left 12/30/2014   Procedure: CHONDROPLASTY;  Surgeon: Leandrew Koyanagi, MD;  Location: [place];  Service: Orthopedics;  Laterality: Left;  . KNEE ARTHROSCOPY Left 12/30/2014   Procedure: LEFT KNEE ARTHROSCOPY WITH   CHONDROPLASTY;  Surgeon: Leandrew Koyanagi, MD;  Location: [place];   Service: Orthopedics;  Laterality: Left;  . PARTIAL KNEE ARTHROPLASTY Left 08/03/2016   Procedure: LEFT UNICOMPARTMENTAL KNEE ARTHROPLASTY;  Surgeon: Leandrew Koyanagi, MD;  Location: [place];  Service: Orthopedics;  Laterality: Left;  . TONSILLECTOMY    . TUBAL LIGATION  1992    Family Psychiatric History:  Depression Sister    Family History:  Family History  Problem Relation Age of Onset  . Cancer Mother 85       melanoma  . Hypertension Sister   . Heart disease Sister        valve disease  . Depression Sister   . Diabetes Sister   . Sjogren's syndrome Sister     Social History:  Social History   Social History  . Marital status: Single/Divorced    Spouse name: N/A  . Number of children: 2 daughters-1 killed in  MVA which caused PTSD to her  . Years of education: HS   Social History Main Topics  . Smoking status: Current Every Day Smoker    Packs/day: 1.00    Years: 25.00    Types: Cigarettes  . Smokeless tobacco: Never Used     Comment: advised to d/c by 3 cigs per week.  . Alcohol use No     Comment: occasional  . Drug use: No  . Sexual activity: Yes    Birth control/ protection: None, Post-menopausal   Other Topics Concern  . Abnormal grief reaction/Chronic pain syndrome;Dysfunctional familyShe has history of troubled /failed relationships-her mother wont talk to her-she doesnt know why.Her father has never been in her life-her mother forbids talking about him.   Social History Narrative  . See Counselor's initial note    Allergies:  Allergies  Allergen Reactions  . Ace Inhibitors     REACTION: cough  . Aspirin Other (See Comments)    Stomach Ulcer  . Atenolol Other (See Comments)    Bradycardia  . Celexa [Citalopram Hydrobromide] Other (See Comments)    Dizziness Tolerated Lexapro   . Codeine Nausea Only  . Nitroglycerin Other (See Comments)    Drop in BP  . Phenytoin Swelling  . Sertraline Other (See Comments)    unknown  . Triamcinolone      Steroid flare after joint injection, use other steroid for injections    Metabolic Disorder Labs: Lab Results  Component Value Date   HGBA1C 5.6 07/31/2016   MPG 114 07/31/2016   Lab Results  Component Value Date   PROLACTIN 4.3 03/04/2013   Lab Results  Component Value Date   CHOL 231 (H) 10/28/2015   TRIG 231 (H) 10/28/2015   HDL 47 10/28/2015   CHOLHDL 4.9 10/28/2015   VLDL 46 (H) 10/28/2015   LDLCALC 138 (H) 10/28/2015   LDLCALC 159 (H) 11/27/2014     Current Medications: Current Outpatient Prescriptions  Medication Sig Dispense Refill  . acetaminophen (TYLENOL) 500 MG tablet Take 1,000 mg by mouth every 6 (six) hours as needed for mild pain or headache.    . ARIPiprazole (ABILIFY) 10 MG tablet Take 1 tablet (10 mg total) by mouth daily. 30 tablet 2  . celecoxib (CELEBREX) 200 MG capsule One to 2 tablets by mouth daily as needed for pain. 60 capsule 2  . clonazePAM (KLONOPIN) 1 MG tablet Take 1 tablet (1 mg total) by mouth 3 (three) times daily as needed for anxiety. NO EARLY REFILLS 90 tablet 2  . DULoxetine (CYMBALTA) 60 MG capsule Take 1 capsule (60 mg total) by mouth daily. 30 capsule 3  . HYDROcodone-acetaminophen (NORCO) 7.5-325 MG tablet Take 1-2 tablets by mouth 2 (two) times daily as needed for moderate pain. 30 tablet 0  . omeprazole (PRILOSEC) 40 MG capsule Take 1 capsule (40 mg total) by mouth daily. 30 capsule 6  . SUMAtriptan (IMITREX) 100 MG tablet Take 1 tablet (100 mg total) by mouth every 2 (two) hours as needed for migraine. No more than 2 in 24 hours. 10 tablet 6  . topiramate (TOPAMAX) 100 MG tablet Take 1 tablet in am and 2 tablets in PM 90 tablet 2  . metFORMIN (GLUCOPHAGE) 500 MG tablet   0  . methocarbamol (ROBAXIN) 750 MG tablet Take 1 tablet (750 mg total) by mouth 2 (two) times daily as needed for muscle spasms. (Patient not taking: Reported on 10/12/2016) 60 tablet 0  . ondansetron (ZOFRAN) 4 MG tablet take  1 to 2 tablet by mouth every 8 hours if  needed for nausea and vomiting  0  . pregabalin (LYRICA) 100 MG capsule Take 1 capsule (100 mg total) by mouth 3 (three) times daily. No early refills 90 capsule 2  . promethazine (PHENERGAN) 25 MG tablet take 1 tablet by mouth every 6 hours if needed for nausea  0   No current facility-administered medications for this visit.    Psychiatric Specialty Exam: ROS Review of Systems: Psychiatric: Agitation: No Hallucination: No Depressed Mood:  Continues to improve slowly Insomnia: No Hypersomnia: No Altered Concentration: No Feels Worthless: At times Grandiose Ideas: No Belief In Special Powers: No New/Increased Substance Abuse: Says she ran out of Klonopin early NCCSRS negative Compulsions: No Neurologic: Headache: Hx of "Migraines" Seizure: No Paresthesias: Yes Sciatica    Blood pressure 124/74, pulse 88, resp. rate 16, height 5\' 7"  (1.702 m), weight 264 lb (119.7 kg), last menstrual period 12/28/2014, SpO2 97 %.Body mass index is 41.35 kg/m.  General Appearance: Slightly Disheveled hair ;Fairly Groomed  Eye Contact:  Good  Speech:  Clear and Coherent  Volume:  Normal  Mood:  Dysphoric  Affect:  Variable  Thought Process:  Coherent, Goal Directed and Descriptions of Associations: Intact  Orientation:  Full (Time, Place, and Person)  Thought Content: Logical, Rumination and improved grieving process   Suicidal Thoughts:  No  Homicidal Thoughts:  No  Memory:  Negative  Judgement:  Fair  Insight:  Lacking  Psychomotor Activity:  Hip pain  Concentration:  Concentration: Good and Attention Span: Good  Recall:  Intact  Fund of Knowledge: Fair  Language: Fair  Akathisia:  NA  Handed:  Left  AIMS (if indicated):  NA  Assets:  Runner, broadcasting/film/video Vocational/Educational  ADL's:  Intact  Cognition: Impaired,  Moderate due to dysfunctional family/dchildhood  Sleep: No complaint  Musculoskeletal: Strength &  Muscle Tone: Abnormal Gait & Station: Favors Rt hip Patient leans: Backward     Treatment Plan Summary:  Plan of Care:PTSD depression/anxiety -continue current meds except  D/C Neurontin and add Lyrica 100 mg TID-pt cautioned about abuse dependency   Continue regular Counseling  Laboratory: per PCP  Psychotherapy: [facility]  Medications: -see list for all meds  Routine PRN Medications:  Yes Klonopin  Consultations: None at ths time  Safety Concerns:  abuse of medications/dependence with risk factors(PTSD)  Other:  NA      Darlyne Russian, PA-C 10/12/2016, 1:45 PM

## 2016-10-12 NOTE — Telephone Encounter (Signed)
See message below °

## 2016-10-12 NOTE — Addendum Note (Signed)
Addended by: Precious Bard on: 10/12/2016 03:36 PM   Modules accepted: Orders

## 2016-10-12 NOTE — Telephone Encounter (Signed)
Yes hydrocodone on 09/28/16.

## 2016-10-12 NOTE — Progress Notes (Signed)
[facility] Follow-up Outpatient Visit   Date: 07/13/2016 1:30pm    Subjective: "I'm making a little progress..still more bad days than good"  HPI Ashley Gomez is a 46 y.o. female here for FU for her PTSD with associate depression and anxiety. She also has significant chronic pain issues andis followed closely by her PCP fot this .She continues here with bi-weekly  counseling which has been helpful.She continues to report being stable on current regimine and has gotten some response to dedreased appetite and wgt by cutting her Abilify to 10 mg daily.and adding Topomax She reports she has gotten her disability and insurance . Additional history: Progress Notes by Hali Marry, MD at 02/29/2016 10:30 AM  Author: Hali Marry, MD Author Type: Physician Filed: 02/29/2016 3:04 PM  Note Status: Signed Cosign: Cosign Not Required Encounter Date: 02/29/2016  Editor: Hali Marry, MD (Physician)    Subjective:    CC:   HPI: Chronic pain management of her low back and bilateral knees. She is currently on hydrocodone 3 tabs daily. Her last urine drug screen was March, 5 mo ago. At one point was considering knee replacement. She is waiting to hear back about the financial end of it. She is also currently on gabapentin to help control her pain. And she also uses Flexeril as needed. She's also on Cymbalta. She feels like her stress levels are higher and she feels more depressed. She is living in a tent with her fiance and was kicked out of her "daughter's house".  She occ takes an extra dose of pain medications.  Smokes 1/2 ppd for 30 days.      07/03/2016     THERAPIST PROGRESS NOTE  Session Time: 2 PM to 2:50 PM Participation Level: Active Behavioral Response: CasualAlertEuthymic Type of Therapy: Individual Therapy Treatment Goals addressed:  decrease in depression, anxiety, stress management and work through grief issues Interventions: Solution  Focused, Biofeedback, Supportive, Reframing and Other: Grief processing Summary: Ashley Gomez is a 46 y.o. female who difficulties of past week related to ex-brother in law who passed away, one of his son's friends passed away, her uncle passing away on the 12th and ex-father-in-law trouble breathing, hole in one of his lung, and needing surgery on heart. Her daughter and son-in-law also had side tire blowout while driving with grandchildren in the car. Shared that it has been one thing after another and made her wonder who is next. She reviewed comments by friends and family after hurts daughter's death and therapist discussed with patient how allowing oneself to grieve is healthy part of process. Patient said if she knew the state of the driver it may help her with forgiveness but then related receiving hospital report for her daughter actually made things worse. Patient watched the [facility] and wants to watch it again because of things that confused her. Patient shared how the movie explains that we won't be able to live or love again until we are able to forgive. She wrote in her journal question of how to forgive. Therapist discussed with patient that the movie could be helpful for her as it is meant to help people work through tragedies and discussed with patient that coping with tragedies involves something other than knowing all the answers and that healing from grief is helpful for patient as it helps her to move on from suffering.  Suicidal/Homicidal: No Therapist Response: Reviewed progress and symptoms and utilize reframing to help patient work through feelings  and grief issues triggered by recent losses. Continue to help patient gain insight to help through grieving process by helping her find meaning and purpose in her life from the loss, acceptance and letting go of things beyond her control to help her focus on making the most of her own life. Help patient process feelings and session.  Provided positive feedback for patient exploring resources to help her with her own coping as well as using her journal to help her in gaining insight and coping. Provided strength based and supportive interventions. Plan: Return again in 2 weeks.2.Patient review the "[employer]" to help her in gaining more insight to coping.3. She can continue to work on his coping strategies to manage mood, and grief issues Diagnosis:      Axis I:   Post Traumatic Stress Disorder and Complicated grief, persistent depressive disorder                         Axis II: No diagnosis [name],[name], LCSW 07/03/2016                                                                                                                                                                                              Current Medications:  Prescription Sig. Disp. Refills Start Date End Date Status  gabapentin (NEURONTIN) 300 mg capsule  Take by mouth.   05/04/2014  Active  nortriptyline HCl (PAMELOR) 50 mg capsule  Take by mouth.   04/03/2014  Active  vilazodone (VIIBRYD) 40 mg TABS tablet  Take by mouth.   10/01/2013  Active  diazepam (VALIUM) 10 mg tablet  Take by mouth.   06/04/2014  Active  hydrochlorothiazide (HYDRODIURIL) 25 mg tablet  TAKE ONE TABLET BY MOUTH DAILY AS NEEDED   06/04/2014  Active  HYDROcodone-acetaminophen (NORCO) 7.5-325 mg per tablet  TAKE ONE TABLET BY MOUTH EVERY EIGHT HOURS AS NEEDED FOR PAIN   06/04/2014  Active  losartan potassium (COZAAR) 50 mg tablet  TAKE ONE TABLET BY MOUTH ONE TIME DAILY   06/04/2014  Active  promethazine (PHENERGAN) 50 MG tablet  Take every 6 hours as needed for nausea or migraine.   09/12/2012  Active  SUMAtriptan succinate (IMITREX) 100 mg tablet  Take by mouth.   05/04/2014  Active  topiramate (TOPAMAX) 100 MG tablet  Take by mouth.   05/04/2014  Active  buPROPion HCl (WELLBUTRIN XL) 150 mg 24 hr tablet  Take 150 mg by mouth daily.      Active  ondansetron (ZOFRAN-ODT) 4 mg disintegrating tablet  Take one tablet (4 mg total) by mouth every  6 (six) hours as needed for Nausea. 20 tablet  0 10/29/2014  Active  dicyclomine (BENTYL) 20 mg tablet  Take one tablet (20 mg total) by mouth every 6 (six) hours as needed. 30 tablet  0 10/29/2014  Active    Review of Systems: Psychiatric: Agitation: No Hallucination: No Depressed Mood:  Continues to improve slowly Insomnia: No Hypersomnia: No Altered Concentration: No Feels Worthless: At times Grandiose Ideas: No Belief In Special Powers: No New/Increased Substance Abuse: No NCCSRS negative Compulsions: No  Neurologic: Headache: No Seizure: No Paresthesias: No  Vital Signs BP 124/80 P-99 Hgt 5'6" Wgt 261 lbs BMI 42.2  Mental Status Examination  Appearance: Alert: Yes Attention: good  Cooperative: Yes Eye Contact: Good Speech: Clear and coherent Psychomotor Activity: Normal Memory/Concentration: Normal/intact Oriented: person, place, time/date and situation Mood: Euthymic Affect: Appropriate and Congruent Thought Processes and Associations: Coherent and Intact Fund of Knowledge: Good Thought Content: WDL Insight: Good Judgement: Good  Labs:Results for JONISE, WEIGHTMAN (MRN [id number]) as of 03/17/2016 13:52  Ref. Range 10/28/2015 11:46 11/03/2015 11:46 11/03/2015 11:48 11/03/2015 11:49 12/28/2015 10:35  Sodium Latest Ref Range: 135 - 146 mmol/L 139      Potassium Latest Ref Range: 3.5 - 5.3 mmol/L 4.1      Chloride Latest Ref Range: 98 - 110 mmol/L 110      CO2 Latest Ref Range: 20 - 31 mmol/L 20      BUN Latest Ref Range: 7 - 25 mg/dL 14      Creatinine Latest Ref Range: > or = 20.0 mg/dL 0.82    64.5  Calcium Latest Ref Range: 8.6 - 10.2 mg/dL 8.7      Glucose Latest Ref Range: 65 - 99 mg/dL 92      Alkaline Phosphatase Latest Ref Range: 33 - 115 U/L 79      Albumin Latest Ref Range: 3.6 - 5.1 g/dL 3.8      AST Latest Ref Range: 10 - 35 U/L 15      ALT  Latest Ref Range: 6 - 29 U/L 9      Total Protein Latest Ref Range: 6.1 - 8.1 g/dL 6.5      Total Bilirubin Latest Ref Range: 0.2 - 1.2 mg/dL 0.3      GFR, Est African American Latest Ref Range: >=60 mL/min >89      GFR, Est Non African American Latest Ref Range: >=60 mL/min 86      Cholesterol Latest Ref Range: 125 - 200 mg/dL 231 (H)      Triglycerides Latest Ref Range: <150 mg/dL 231 (H)      HDL Cholesterol Latest Ref Range: >=46 mg/dL 47      LDL (calc) Latest Ref Range: <130 mg/dL 138 (H)      VLDL Latest Ref Range: <30 mg/dL 46 (H)      Total CHOL/HDL Ratio Latest Ref Range: <=5.0 Ratio 4.9      WBC Latest Ref Range: 3.8 - 10.8 K/uL 5.7      RBC Latest Ref Range: 3.80 - 5.10 MIL/uL 4.94      Hemoglobin Latest Ref Range: 11.7 - 15.5 g/dL 14.3      HCT Latest Ref Range: 35.0 - 45.0 % 42.9      MCV Latest Ref Range: 80.0 - 100.0 fL 86.8      MCH Latest Ref Range: 27.0 - 33.0 pg 28.9      MCHC Latest Ref Range: 32.0 - 36.0 g/dL 33.3  RDW Latest Ref Range: 11.0 - 15.0 % 14.0      Platelets Latest Ref Range: 140 - 400 K/uL 249      MPV Latest Ref Range: 7.5 - 12.5 fL 10.4        Hydromorphone Latest Ref Range: <50 ng/mL     54 (H)  NORDIAZEPAM Latest Ref Range: <50 ng/mL     NEGATIVE  LH Latest Units: mIU/mL 39.9      FSH Latest Units: mIU/mL 39.5      Estradiol Latest Units: pg/mL 15      Progesterone Latest Units: ng/mL <0.5      TSH Latest Units: mIU/L 0.80        Codeine Latest Ref Range: <50 ng/mL     NEGATIVE  Please Note (HCV): Unknown     .  pH Latest Ref Range: 4.5 - 9.0      7.11  Phencyclidine Latest Ref Range: <25 ng/mL     NEGATIVE  Cocaine Metabolite Latest Ref Range: <150 ng/mL     NEGATIVE    Morphine Latest Ref Range: <50 ng/mL     NEGATIVE  Marijuana Metabolite Latest Ref Range: <20 ng/mL     NEGATIVE  Amphetamines Latest Ref Range: <500 ng/mL     NEGATIVE  Barbiturates Latest Ref Range: <300 ng/mL     NEGATIVE  Benzodiazepines Latest Ref Range: <100 ng/mL      POSITIVE (A)  Hydrocodone Latest Ref Range: <50 ng/mL     150 (H)  Opiates Latest Ref Range: <100 ng/mL     POSITIVE (A)  OXYCODONE Latest Ref Range: <100 ng/mL     NEGATIVE  LORAZEPAM Latest Ref Range: <50 ng/mL     NEGATIVE  OXAZEPAM Latest Ref Range: <50 ng/mL     NEGATIVE  TEMAZEPAM Latest Ref Range: <50 ng/mL     NEGATIVE  MM DIGITAL SCREENING BILATERAL Unknown    Rpt   US TRANSVAGINAL NON-OB Unknown   Rpt    US PELVIS COMPLETE Unknown  Rpt     6 Acetylmorphine Latest Ref Range: <10 ng/mL     NEGATIVE  Alcohol Metabolites Latest Ref Range: <500 ng/mL     NEGATIVE  Alphahydroxyalprazolam Latest Ref Range: <25 ng/mL     NEGATIVE  Alphahydroxymidazolam Latest Ref Range: <50 ng/mL     NEGATIVE  Alphahydroxytriazolam Latest Ref Range: <50 ng/mL     NEGATIVE  Aminoclonazepam Latest Ref Range: <25 ng/mL     436 (H)  Hydroxyethylflurazepam Latest Ref Range: <50 ng/mL     NEGATIVE  Methadone Metabolite Latest Ref Range: <100 ng/mL     NEGATIVE  Norhydrocodone Latest Ref Range: <50 ng/mL     355 (H)  Oxidant Latest Ref Range: <200 mcg/mL     NEGATIVE    Visit Diagnosis:             ICD-9-CM ICD-10-CM        1.   PTSD (post-traumatic stress disorder) 309.81 F43.10       2.   Depression, major, recurrent, moderate (HCC) 296.32 F33.1       3.   Complicated grief 951.8 A41.66...       4.   Chronic pain syndrome 338.4 G89.4       5.   Morbid obesity, unspecified obesity type ([place]) 278.01 E66.01       6.   Dysthymic disorder 300.4 F34.1       7.   Anxiety state 300.00 F41.1  8.   Dependent personality disorder 301.6 F60.7       9.   DDD (degenerative disc disease), lumbosacral 722.52 M51.37       10.   Osteoarthrosis involving lower leg 715.96 M17.10       11.   Osteoarthritis of right shoulder, unspecified osteoarthritis type 715.91 M19.011        Treatment Plan/Recommendations:  Plan of Care:PTSD depression/anxiety -continue current meds and Counseling  Laboratory:  per  Dr Madilyn Fireman  [facility]otherapy: [facility]  Medications: Continue current Psychiatric Rx-see list for all meds  Routine PRN Medications:  Yes Klonopin  Consultations: None at ths time  Safety Concerns:  abuse of medications/dependence with risk factors(PTSD)  Other:  NA     Darlyne Russian, PA-C 10/12/2016

## 2016-10-12 NOTE — Telephone Encounter (Signed)
She is taking it too often.  She needs to back it off.  Take advil to supplement.  She can have #20 norco only 1 daily prn

## 2016-10-13 NOTE — Telephone Encounter (Signed)
Called patient to advise no answer. LMOM.    

## 2016-10-16 ENCOUNTER — Telehealth (HOSPITAL_COMMUNITY): Payer: Self-pay | Admitting: Medical

## 2016-10-16 NOTE — Telephone Encounter (Signed)
Charles gave pt rx for lyrica. Medicare and medicaid will not pay for it. What would her like to do now?

## 2016-10-16 NOTE — Telephone Encounter (Signed)
[facility]. Spoke w/ pharmacist, Lyrica prescription was filled today. Pharmacy will contact patient once prescription is filled. Called and informed patient of amount of prescription. Patient shows understanding.

## 2016-10-17 ENCOUNTER — Other Ambulatory Visit: Payer: Self-pay | Admitting: Family Medicine

## 2016-10-17 ENCOUNTER — Other Ambulatory Visit: Payer: Self-pay | Admitting: Sports Medicine

## 2016-10-17 DIAGNOSIS — G8929 Other chronic pain: Secondary | ICD-10-CM

## 2016-10-17 DIAGNOSIS — M25512 Pain in left shoulder: Principal | ICD-10-CM

## 2016-10-18 NOTE — Telephone Encounter (Signed)
Thanks

## 2016-10-23 ENCOUNTER — Ambulatory Visit (INDEPENDENT_AMBULATORY_CARE_PROVIDER_SITE_OTHER): Payer: Medicare Other | Admitting: Licensed Clinical Social Worker

## 2016-10-23 DIAGNOSIS — F4329 Adjustment disorder with other symptoms: Secondary | ICD-10-CM | POA: Diagnosis not present

## 2016-10-23 DIAGNOSIS — F4321 Adjustment disorder with depressed mood: Secondary | ICD-10-CM

## 2016-10-23 DIAGNOSIS — F341 Dysthymic disorder: Secondary | ICD-10-CM | POA: Diagnosis not present

## 2016-10-23 DIAGNOSIS — Z634 Disappearance and death of family member: Secondary | ICD-10-CM

## 2016-10-23 DIAGNOSIS — F4310 Post-traumatic stress disorder, unspecified: Secondary | ICD-10-CM | POA: Diagnosis not present

## 2016-10-23 DIAGNOSIS — Z630 Problems in relationship with spouse or partner: Secondary | ICD-10-CM | POA: Diagnosis not present

## 2016-10-23 NOTE — Progress Notes (Signed)
   THERAPIST PROGRESS NOTE  Session Time: 1:10 PM to 1:54 PM  Participation Level: Active  Behavioral Response: CasualAlertEuthymic  Type of Therapy: Individual Therapy  Treatment Goals addressed:  decrease in depression, anxiety, stress management and work through grief issues  Interventions: Solution Focused, Strength-based, Supportive and Other: effective interpersonal skills  Summary: Ashley Gomez is a 46 y.o. female who presents with anxiety was getting worse, relates it to a lot of changes going on in her life and describes symptoms of can't breath and something on chest, overwhelming feeling, and rapid heartbeat. Described issue with provider around request for increase in Klonopin and not realizing it was a controlled substance. Described her feeling of "being at [place] pig" and just being given meds. Described resolution of being prescribed Lyrica and thinks anxiety is better but still too early to tell. Discussed working through issues of being alone more at house and fiance not being there is most she would like and has decided that "it is her home whether he is there or not". This perspective is helping her more toward acceptance of things she can't change it is helping her cope. Identified issues in relationship related to her needs not being met but still preferring the companionship and seeing some positive aspects of being in the relationship. She did bring up to him that the house is not "a revolving door". He did give her some money toward rent but feels he needs to contribute more and did problem solving around this issue. Shared that she is coming to the point where she will be able to accept but also sees there is no motivation for him to go. She is making adjustments to living alone and relates it "it is worth it". Shared knee is healing and will help with her mobility. Goes back on 18th. Sees ways that change is good. Shared that she is fixing up her house and that she is  hanging pictures, repaint the kitchen and bathroom is in process of being repainting. Reviewed session and patient related it helped her to vent.   Suicidal/Homicidal: No  Therapist Response: Help patient to process feelings around last encounter with provider as well has her relationship. Provided positive feedback for patient standing up for herself. Discussed ways patient is coming to acceptance about problems in relationship including her perspective on relationship. Discussed pros and cons of the relationship to help patient and her decision making around relationship and helping patient to see way she still wants to continue with the relationship. Discussed ways her needs are not being met but way she is working toward acceptance and recognition of limits of what she control and another person. Identified positive developments in life to help her and awareness that will help with coping and mood. Discussed positive aspects she is experiencing of change and making some progress and adjusting to change. Discussed central role she plays and grandchildren's life and identifying meaningful aspect of her life helps with appreciation of aspects of her life and contributes to mental well-being. Provided supportive and strength-based interventions.  Plan: Return again in 2 weeks.2. Help patient process feelings around stressors and helping her in building insight to effective coping strategies 3. Her past help patient ain processing through relationship issues and finding effective coping strategies.  Diagnosis: Axis I:  Post Traumatic Stress Disorder and Complicated grief, persistent depressive disorder, Partner Relationship issues     Axis II: No diagnosis    Cordella Register, LCSW 10/23/2016

## 2016-10-26 ENCOUNTER — Ambulatory Visit (INDEPENDENT_AMBULATORY_CARE_PROVIDER_SITE_OTHER): Payer: Medicare Other | Admitting: Orthopaedic Surgery

## 2016-10-30 ENCOUNTER — Ambulatory Visit (INDEPENDENT_AMBULATORY_CARE_PROVIDER_SITE_OTHER): Payer: Medicare Other | Admitting: Orthopaedic Surgery

## 2016-10-30 DIAGNOSIS — M1712 Unilateral primary osteoarthritis, left knee: Secondary | ICD-10-CM

## 2016-10-30 MED ORDER — HYDROCODONE-ACETAMINOPHEN 7.5-325 MG PO TABS: ORAL_TABLET | ORAL | 0 refills | Status: DC

## 2016-10-30 NOTE — Progress Notes (Signed)
Ashley Gomez is approximately 3 months status post left partial knee replacement. She is still having mainly pain at night. She is supposed to see a chronic pain management clinic starting Thursday. She is ambulating well during the day. She is not using any assistive devices. She has finished physical therapy. Her exam is benign. She has excellent range of motion. At this point I'll see her back in 3 months with 2 view x-rays of the left knee.  Hydrocodone was refilled today.

## 2016-11-02 ENCOUNTER — Ambulatory Visit (HOSPITAL_COMMUNITY): Payer: Self-pay | Admitting: Medical

## 2016-11-07 ENCOUNTER — Other Ambulatory Visit: Payer: Self-pay

## 2016-11-07 ENCOUNTER — Ambulatory Visit: Payer: Self-pay

## 2016-11-14 ENCOUNTER — Ambulatory Visit (INDEPENDENT_AMBULATORY_CARE_PROVIDER_SITE_OTHER): Payer: Medicare Other | Admitting: Licensed Clinical Social Worker

## 2016-11-14 DIAGNOSIS — F4329 Adjustment disorder with other symptoms: Secondary | ICD-10-CM | POA: Diagnosis not present

## 2016-11-14 DIAGNOSIS — Z634 Disappearance and death of family member: Secondary | ICD-10-CM | POA: Diagnosis not present

## 2016-11-14 DIAGNOSIS — F4310 Post-traumatic stress disorder, unspecified: Secondary | ICD-10-CM

## 2016-11-14 DIAGNOSIS — Z630 Problems in relationship with spouse or partner: Secondary | ICD-10-CM

## 2016-11-14 DIAGNOSIS — F341 Dysthymic disorder: Secondary | ICD-10-CM | POA: Diagnosis not present

## 2016-11-14 DIAGNOSIS — F4321 Adjustment disorder with depressed mood: Secondary | ICD-10-CM

## 2016-11-14 NOTE — Progress Notes (Signed)
   THERAPIST PROGRESS NOTE  Session Time: 2:05 PM to 2:55 PM  Participation Level: Active  Behavioral Response: CasualEuthymic,Lethargic  Type of Therapy: Individual Therapy  Treatment Goals addressed:  decrease in depression, anxiety, stress management and work through grief issues  Interventions: CBT, Solution Focused, Strength-based, Supportive and Reframing  Summary: Ashley Gomez is a 46 y.o. female who presents with "being depressed a lot". Some days depressed and some days just doesn't care. On days she is depressed she cries a lot and thinks about  Ashley Gomez, her daughter who has died, would be there to support her. Shared that being further away she misses the kids. Provider put her on Lyrica. Not sure if it is helping or not. Has days where she doesn't care. She doesn't do anything, and she will feel like all she wants to do is sleep. Discussed relationship issues, self-talk that helps, and relates that she she tells herself that it is her house no matter what. Ashley Gomez coming around slowly. Attempts to set boundaries with fiance, and these often don't work effectively so she shares that she doesn't want to fight, doesn't want to get upset and points out that she does not see any use in getting upset. She had the kids over night and really enjoyed it and hopes that happens more often. Will give the med a chance since not a month she has been on it. Discussed sitting at the table and thinking too much. Thoughts ramble together, trouble getting to sleep, and doesn't have the people who she could normally go to. Reviewed CBT tape. Reviewed session and related that she gain insight that a person can give different meaning to things that impact our feelings.  Suicidal/Homicidal: No  Therapist Response: Reviewed session and identified medications as a concern but patient will give the meds a little more time before deciding they aren't helping. Discussed CBT strategies that could be helpful  including recognizing the meaning that she gives to events are the underlying source of emotions so pay attention to thoughts that lead to negative emotions and challenged those thoughts. Identified how she is utilized her perspective and meaning she has connected to fiance's behaviors as helped her cope. Help patient to process feelings around relationship issues and explored and identified effective coping strategies to help manage these stressors. Identified underlying source of depression related to not having people there who would normally be there for her and encouraged patient to utilize CBT to help reframe to different and more helpful perspective to help with depression. Provided supportive and strength-based interventions.  Plan: Return again in 2 weeks.2. Help patient process feelings around stressors and helping her in building insight to effective coping strategies 3. Help patient in processing through relationship issues and finding effective coping strategies and continue to work through past issues.  Diagnosis: Axis I:  Post Traumatic Stress Disorder and Complicated grief, persistent depressive disorder, Partner Relationship issues    Axis II: No diagnosis    Ashley Register, LCSW 11/14/2016

## 2016-11-16 ENCOUNTER — Other Ambulatory Visit (HOSPITAL_COMMUNITY): Payer: Self-pay | Admitting: Medical

## 2016-11-16 ENCOUNTER — Ambulatory Visit (HOSPITAL_COMMUNITY): Payer: Self-pay | Admitting: Medical

## 2016-11-21 ENCOUNTER — Telehealth (INDEPENDENT_AMBULATORY_CARE_PROVIDER_SITE_OTHER): Payer: Self-pay | Admitting: Orthopaedic Surgery

## 2016-11-21 NOTE — Telephone Encounter (Signed)
PT REQUESTS REFILL OF PAIN MEDS PLEASE.   [phone]

## 2016-11-21 NOTE — Telephone Encounter (Signed)
She is 3 months postop now.  Narcotics are not indicated at this point.  If she still needs them, then we should refer her to pain management clinic.

## 2016-11-21 NOTE — Telephone Encounter (Signed)
Please advise if so which pain meds and how many?

## 2016-11-22 NOTE — Telephone Encounter (Signed)
Advised message and she states the comprehensive pain mang clinic closed down and has appt at the end of August with another clinic. Advised if she needed something to call her PCP and see if they will give her any but Dr Erlinda Hong will not prescribe anything else(Narcotics)

## 2016-11-28 ENCOUNTER — Ambulatory Visit: Payer: Self-pay | Admitting: Family Medicine

## 2016-11-28 ENCOUNTER — Ambulatory Visit (HOSPITAL_COMMUNITY): Payer: Self-pay | Admitting: Licensed Clinical Social Worker

## 2016-11-29 ENCOUNTER — Ambulatory Visit (INDEPENDENT_AMBULATORY_CARE_PROVIDER_SITE_OTHER): Payer: Medicare Other | Admitting: Family Medicine

## 2016-11-29 ENCOUNTER — Encounter: Payer: Self-pay | Admitting: Family Medicine

## 2016-11-29 VITALS — BP 143/88 | HR 88 | Ht 67.0 in | Wt 275.0 lb

## 2016-11-29 DIAGNOSIS — E785 Hyperlipidemia, unspecified: Secondary | ICD-10-CM

## 2016-11-29 DIAGNOSIS — F5101 Primary insomnia: Secondary | ICD-10-CM

## 2016-11-29 DIAGNOSIS — M791 Myalgia: Secondary | ICD-10-CM

## 2016-11-29 DIAGNOSIS — I10 Essential (primary) hypertension: Secondary | ICD-10-CM | POA: Diagnosis not present

## 2016-11-29 DIAGNOSIS — M7918 Myalgia, other site: Secondary | ICD-10-CM

## 2016-11-29 DIAGNOSIS — R1084 Generalized abdominal pain: Secondary | ICD-10-CM | POA: Diagnosis not present

## 2016-11-29 DIAGNOSIS — G43009 Migraine without aura, not intractable, without status migrainosus: Secondary | ICD-10-CM | POA: Diagnosis not present

## 2016-11-29 MED ORDER — TOPIRAMATE 200 MG PO TABS: 200.0000 mg | ORAL_TABLET | Freq: Two times a day (BID) | ORAL | 3 refills | Status: DC

## 2016-11-29 NOTE — Patient Instructions (Addendum)
Increased the topamax.  New prescription sent to pharmacy  Try holding your celebrex for a few days and see if your stomach feels better.    Can also try increasing your prilosec to twice a day if needed.   If stomach is still not feeling better then can try moving the medications around to try to limit 8 which one is causing it.

## 2016-11-29 NOTE — Progress Notes (Signed)
Subjective:    CC: HTN  HPI:  Hypertension- Pt denies chest pain, SOB, dizziness, or heart palpitations.  Taking meds as directed w/o problems.  Denies medication side effects.    Myofascial pain -  Overall she is still really struggling with pain. She says more recently and diffuse muscle pain all over her body.  She is also very upset about her weight. She's gained a significant amount of weight in the last couple of years. Her BMI is now up to 43. She noticed it's partly her medications and once after something she could take that would actually help suppress her appetite.  Not sleeping well - hard time falling asleep and staying asleep. A lot of it is her pain is uncontrolled she feels and that is keeping her awake.  More frequent headaches - horses she's been having more frequent headaches recently. She's not really sure why.  She's also had some diffuse abdominal pain recently. She says it usually occurs about 30 minutes after she takes her medications. She currently takes all of her medications at one time she does not separate them out. She's not sure what may be causing it. It usually will ease off eventually. She does get nauseated with it but has not actually vomited. She has not noticed any significant changes in bowels or blood in the stool. She does take her Prilosec daily.  Past medical history, Surgical history, Family history not pertinant except as noted below, Social history, Allergies, and medications have been entered into the medical record, reviewed, and corrections made.   Review of Systems: No fevers, chills, night sweats, weight loss, chest pain, or shortness of breath.   Objective:    General: Well Developed, well nourished, and in no acute distress.  Neuro: Alert and oriented x3, extra-ocular muscles intact, sensation grossly intact.  HEENT: Normocephalic, atraumatic  Skin: Warm and dry, no rashes. Cardiac: Regular rate and rhythm, no murmurs rubs or gallops,  no lower extremity edema.  Respiratory: Clear to auscultation bilaterally. Not using accessory muscles, speaking in full sentences. Abd: Soft, normal BS, tender on the left side to the umbilicus and in the right upper quadrant. No rebound or guarding   Impression and Recommendations:    HTN - Blood pressure was not at goal today. We'll have her come back in a week or 2 for nurse visit.  Myofascial pain - she is are any on a high-dose of Lyrica as well as Cymbalta. I really don't want to make any adjustments to these right now. Also want to stay way from more sedating medications. Judeen Hammans has Robaxin to take as needed.  Insomnia-this is been chronic and ongoing and I suspect is really related to her underlying psychiatric illness.  Migraine without aura-she really is Artie above the 100 mg twice a day dosing recommended for migraine headaches but I will try increasing her to appear made to 2 mg twice a day. If at that point it does not get better control of her headaches then I recommend that we consider a neurology consultation. She could benefit from Botox injections or maybe even trigger point injections as she also has chronic cervical pain which does seem to be a big trigger for her headaches. She also continues to smoke which is a known trigger for migraines as well.  Abdominal pain-pain usually occurs about 30 minutes after taking her medications. I suspect this could be coming from the Celebrex to have her hold that for a few days and see  if her pain resolves. If it doesn't and I recommend that she actually split her medications up and take half of them in the morning and the other half in the evening and that way she may be able to narrow down which medication might actually be causing some stomach upset. She does take her Prilosec daily so she could also try increasing that to twice a day if needed.

## 2016-11-30 LAB — COMPLETE METABOLIC PANEL WITH GFR
ALT: 9 U/L (ref 6–29)
AST: 17 U/L (ref 10–35)
Albumin: 3.6 g/dL (ref 3.6–5.1)
Alkaline Phosphatase: 98 U/L (ref 33–115)
BUN: 13 mg/dL (ref 7–25)
CO2: 26 mmol/L (ref 20–31)
Calcium: 9 mg/dL (ref 8.6–10.2)
Chloride: 103 mmol/L (ref 98–110)
Creat: 0.85 mg/dL (ref 0.50–1.10)
GFR, Est African American: >89 mL/min (ref >=60)
GFR, Est Non African American: 82 mL/min (ref >=60)
Glucose, Bld: 79 mg/dL (ref 65–99)
Potassium: 4.6 mmol/L (ref 3.5–5.3)
Sodium: 138 mmol/L (ref 135–146)
Total Bilirubin: 0.3 mg/dL (ref 0.2–1.2)
Total Protein: 6.2 g/dL (ref 6.1–8.1)

## 2016-11-30 LAB — LIPID PANEL
Cholesterol: 218 mg/dL — ABNORMAL HIGH (ref <200)
HDL: 44 mg/dL — ABNORMAL LOW (ref >50)
LDL Cholesterol: 144 mg/dL — ABNORMAL HIGH (ref <100)
Total CHOL/HDL Ratio: 5 Ratio — ABNORMAL HIGH (ref <5.0)
Triglycerides: 151 mg/dL — ABNORMAL HIGH (ref <150)
VLDL: 30 mg/dL (ref <30)

## 2016-12-21 ENCOUNTER — Ambulatory Visit (HOSPITAL_COMMUNITY): Payer: Medicare Other | Admitting: Medical

## 2016-12-27 ENCOUNTER — Ambulatory Visit (INDEPENDENT_AMBULATORY_CARE_PROVIDER_SITE_OTHER): Payer: Medicare Other | Admitting: Licensed Clinical Social Worker

## 2016-12-27 DIAGNOSIS — Z630 Problems in relationship with spouse or partner: Secondary | ICD-10-CM

## 2016-12-27 DIAGNOSIS — F341 Dysthymic disorder: Secondary | ICD-10-CM

## 2016-12-27 DIAGNOSIS — Z634 Disappearance and death of family member: Secondary | ICD-10-CM

## 2016-12-27 DIAGNOSIS — F4329 Adjustment disorder with other symptoms: Secondary | ICD-10-CM | POA: Diagnosis not present

## 2016-12-27 DIAGNOSIS — F4321 Adjustment disorder with depressed mood: Secondary | ICD-10-CM

## 2016-12-27 DIAGNOSIS — F4310 Post-traumatic stress disorder, unspecified: Secondary | ICD-10-CM

## 2016-12-27 NOTE — Progress Notes (Signed)
   THERAPIST PROGRESS NOTE  Session Time: 10:00 AM-10:55 AM  Participation Level: Active  Behavioral Response: CasualAlertDysphoric and tearful  Type of Therapy: Individual Therapy  Treatment Goals addressed:  decrease in depression, anxiety, stress management and work through grief issues  Interventions: Motivational Interviewing, Solution Focused, Strength-based, Supportive and Reframing  Summary: Ashley Gomez is a 46 y.o. female who presents in session tearful and related that her boyfriend moved out and "she doesn't care anymore". Explained that she doesn't understand how he can just walk away after being together for 8-9 years. He didn't have money for rent so now has to move out and moving to sister's in [place]. Recognizes this behavior as part of the pattern in the relationship and that she is tired of it and verbalize that "I deserve better". She is tired of doing this. Explored boyfriend's negative behaviors that contributed to problems in relationship and contributed to recent reason he moved out. She relates biggest thing and moving to [place] will be that she will miss the kids. Recognizes that staying where she is presently would not have been good for her as there is too much time alone, would've contributed to depression and too much time to ruminate. She can see where having company will be helpful. "Discussed changes being difficult but also bringing positive experiences and patient relates that she "will make it positive". Relates this experience will make it harder to trust somebody again. Return from sisters on Friday and says she's been fine and not crying. Discussed how it's easier to stay connected in today's world so she will have that connection through visiting and through video communication. Discussed ways this could give her a break, and be renewing while she figures out her next step   Suicidal/Homicidal: No  Therapist Response: Help patient to process  feelings around recent breakup of relationship. Helped her to identify and label feelings to help her and adaptive coping. Explored her feelings to help her in making healthy choices for herself in relationship to her relationship and motivational interviewing strategies to look at pros and cons to help her in making healthy choices. Discussed negative patterns that have continued this relationship, reinforce patient's own insight that she deserves better, discussed that change can be difficult but also can bring positive experiences and having a positive impact in one's life. Encourage patient positive coping of taking this time to focus on hersel. Provided supportive and strength-based interventions.   Plan: Return again in 2 weeks.2. Help patient process feelings around stressors and helping her in building insight to effective coping strategies 3. Help patient in processing through relationship issues and finding effective coping strategies and continue to work through past issues.  Diagnosis: Axis I:  Post Traumatic Stress Disorder and Complicated grief, persistent depressive disorder, Partner Relationship issues    Axis II: No diagnosis    Cordella Register, LCSW 12/27/2016

## 2017-01-04 ENCOUNTER — Ambulatory Visit (INDEPENDENT_AMBULATORY_CARE_PROVIDER_SITE_OTHER): Payer: Medicare Other | Admitting: Medical

## 2017-01-04 ENCOUNTER — Encounter (HOSPITAL_COMMUNITY): Payer: Self-pay | Admitting: Medical

## 2017-01-04 VITALS — BP 136/82 | HR 73 | Resp 16 | Ht 67.0 in | Wt 277.0 lb

## 2017-01-04 DIAGNOSIS — G894 Chronic pain syndrome: Secondary | ICD-10-CM

## 2017-01-04 DIAGNOSIS — F411 Generalized anxiety disorder: Secondary | ICD-10-CM

## 2017-01-04 DIAGNOSIS — F4310 Post-traumatic stress disorder, unspecified: Secondary | ICD-10-CM | POA: Diagnosis not present

## 2017-01-04 DIAGNOSIS — F4329 Adjustment disorder with other symptoms: Secondary | ICD-10-CM

## 2017-01-04 DIAGNOSIS — Z634 Disappearance and death of family member: Secondary | ICD-10-CM

## 2017-01-04 DIAGNOSIS — F331 Major depressive disorder, recurrent, moderate: Secondary | ICD-10-CM | POA: Diagnosis not present

## 2017-01-04 DIAGNOSIS — F4321 Adjustment disorder with depressed mood: Secondary | ICD-10-CM | POA: Diagnosis not present

## 2017-01-04 MED ORDER — ARIPIPRAZOLE 10 MG PO TABS
10.0000 mg | ORAL_TABLET | Freq: Every day | ORAL | 2 refills | Status: DC
Start: 2017-01-04 — End: 2018-01-23

## 2017-01-04 MED ORDER — DULOXETINE HCL 60 MG PO CPEP: 60.0000 mg | ORAL_CAPSULE | Freq: Every day | ORAL | 3 refills | Status: DC

## 2017-01-04 MED ORDER — PREGABALIN 100 MG PO CAPS: 100.0000 mg | ORAL_CAPSULE | Freq: Three times a day (TID) | ORAL | 2 refills | Status: DC

## 2017-01-04 MED ORDER — CLONAZEPAM 1 MG PO TABS: 1.0000 mg | ORAL_TABLET | Freq: Three times a day (TID) | ORAL | 2 refills | Status: DC | PRN

## 2017-01-04 NOTE — Progress Notes (Signed)
  [facility] Discharge Summary  LANAI CONLEE [id number]  Admission date: 07/28/2014 Discharge date: 01/04/2017  Reason for admission: Depression;Grief and Anxiety  Chemical Use History: None Chronic opiate and benzodiazepene prescriptions for pain and anxiety-closely monitored due to risk for abuse-did run out of Klonopin ealy on 1 occasion  Family of Origin Issues: Adult Child of an Alcoholic  Progress in Program Toward Treatment Goals: See attached Counselor's notes. She has made significant progress since initial consultation but continues to have problems letting go of the past .She has resisted  ACOA  Progress (rationale): As above Hopefully she will continue counseling to address her traumatic past.    Darlyne Russian, PA-C 01/04/2017
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
## 2017-01-10 ENCOUNTER — Ambulatory Visit (INDEPENDENT_AMBULATORY_CARE_PROVIDER_SITE_OTHER): Payer: Medicare Other | Admitting: Licensed Clinical Social Worker

## 2017-01-10 DIAGNOSIS — F4381 Prolonged grief disorder: Secondary | ICD-10-CM

## 2017-01-10 DIAGNOSIS — Z630 Problems in relationship with spouse or partner: Secondary | ICD-10-CM | POA: Diagnosis not present

## 2017-01-10 DIAGNOSIS — F4310 Post-traumatic stress disorder, unspecified: Secondary | ICD-10-CM | POA: Diagnosis not present

## 2017-01-10 DIAGNOSIS — Z634 Disappearance and death of family member: Secondary | ICD-10-CM | POA: Diagnosis not present

## 2017-01-10 DIAGNOSIS — F4321 Adjustment disorder with depressed mood: Secondary | ICD-10-CM

## 2017-01-10 DIAGNOSIS — F341 Dysthymic disorder: Secondary | ICD-10-CM | POA: Diagnosis not present

## 2017-01-10 DIAGNOSIS — F4329 Adjustment disorder with other symptoms: Secondary | ICD-10-CM
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
## 2017-01-10 NOTE — Progress Notes (Signed)
Patient ID: Ashley Gomez, female   DOB: 14-May-1971, 46 y.o.   MRN: [id number]  [facility] Discharge Summary    Admission date: 07/28/2014 Discharge date: 01/10/17   Reason for admission: Depression;Grief and Anxiety   Chemical Use History: None Chronic opiate and benzodiazepene prescriptions for pain and anxiety-closely monitored due to risk for abuse-did run out of Klonopin ealy on 1 occasion   Family of Origin Issues: Adult Child of an Alcoholic   Progress in Program Toward Treatment Goals: Patient has made significant progress since starting treatment including improvement in depressive symptoms, learning and applying coping skills to address depression and anxiety symptoms, progress in processing through grief and working on relationship issues.(See counselor note 01/10/17) She continues to have problems of letting go of the past and to continue to work through grief issues.    Progress (rationale): As above and encourage patient to continue counseling to support her with continued progress to work on coping skills and counseling to address her traumatic past.     Felizardo Hoffmann, [place] [id number]/88/[id number]

## 2017-01-21 DIAGNOSIS — Z7689 Persons encountering health services in other specified circumstances: Secondary | ICD-10-CM | POA: Diagnosis not present

## 2017-01-21 DIAGNOSIS — R4020 Unspecified coma: Secondary | ICD-10-CM | POA: Diagnosis not present

## 2017-01-25 ENCOUNTER — Telehealth (INDEPENDENT_AMBULATORY_CARE_PROVIDER_SITE_OTHER): Payer: Self-pay | Admitting: Orthopaedic Surgery

## 2017-01-25 NOTE — Telephone Encounter (Signed)
Returned call to patient left message to call back. 

## 2017-01-29 ENCOUNTER — Ambulatory Visit (INDEPENDENT_AMBULATORY_CARE_PROVIDER_SITE_OTHER): Payer: Medicare Other | Admitting: Orthopaedic Surgery

## 2017-02-02 DIAGNOSIS — G43909 Migraine, unspecified, not intractable, without status migrainosus: Secondary | ICD-10-CM | POA: Diagnosis not present

## 2017-02-02 DIAGNOSIS — G35 Multiple sclerosis: Secondary | ICD-10-CM | POA: Diagnosis not present

## 2017-02-02 DIAGNOSIS — G894 Chronic pain syndrome: Secondary | ICD-10-CM | POA: Diagnosis not present

## 2017-02-02 DIAGNOSIS — F4310 Post-traumatic stress disorder, unspecified: Secondary | ICD-10-CM | POA: Diagnosis not present

## 2017-02-19 DIAGNOSIS — R200 Anesthesia of skin: Secondary | ICD-10-CM | POA: Diagnosis not present

## 2017-02-19 DIAGNOSIS — G43009 Migraine without aura, not intractable, without status migrainosus: Secondary | ICD-10-CM | POA: Diagnosis not present

## 2017-02-19 DIAGNOSIS — M791 Myalgia, unspecified site: Secondary | ICD-10-CM | POA: Diagnosis not present

## 2017-02-19 DIAGNOSIS — G40209 Localization-related (focal) (partial) symptomatic epilepsy and epileptic syndromes with complex partial seizures, not intractable, without status epilepticus: Secondary | ICD-10-CM | POA: Diagnosis not present

## 2017-02-21 DIAGNOSIS — M1611 Unilateral primary osteoarthritis, right hip: Secondary | ICD-10-CM | POA: Diagnosis not present

## 2017-02-21 DIAGNOSIS — Z96652 Presence of left artificial knee joint: Secondary | ICD-10-CM | POA: Diagnosis not present

## 2017-02-21 DIAGNOSIS — Z09 Encounter for follow-up examination after completed treatment for conditions other than malignant neoplasm: Secondary | ICD-10-CM | POA: Diagnosis not present

## 2017-02-21 DIAGNOSIS — M1711 Unilateral primary osteoarthritis, right knee: Secondary | ICD-10-CM | POA: Diagnosis not present

## 2017-02-26 DIAGNOSIS — M5416 Radiculopathy, lumbar region: Secondary | ICD-10-CM | POA: Diagnosis not present

## 2017-03-01 ENCOUNTER — Ambulatory Visit: Payer: Self-pay | Admitting: Family Medicine

## 2017-03-09 DIAGNOSIS — H0015 Chalazion left lower eyelid: Secondary | ICD-10-CM | POA: Diagnosis not present

## 2017-03-12 ENCOUNTER — Encounter: Payer: Self-pay | Admitting: Family Medicine

## 2017-03-12 DIAGNOSIS — M47816 Spondylosis without myelopathy or radiculopathy, lumbar region: Secondary | ICD-10-CM | POA: Diagnosis not present

## 2017-03-12 DIAGNOSIS — G35 Multiple sclerosis: Secondary | ICD-10-CM | POA: Diagnosis not present

## 2017-03-12 DIAGNOSIS — M47817 Spondylosis without myelopathy or radiculopathy, lumbosacral region: Secondary | ICD-10-CM | POA: Diagnosis not present

## 2017-03-12 DIAGNOSIS — G40209 Localization-related (focal) (partial) symptomatic epilepsy and epileptic syndromes with complex partial seizures, not intractable, without status epilepticus: Secondary | ICD-10-CM | POA: Diagnosis not present

## 2017-03-12 DIAGNOSIS — R569 Unspecified convulsions: Secondary | ICD-10-CM | POA: Diagnosis not present

## 2017-03-12 DIAGNOSIS — M545 Low back pain: Secondary | ICD-10-CM | POA: Diagnosis not present

## 2017-03-12 DIAGNOSIS — R9089 Other abnormal findings on diagnostic imaging of central nervous system: Secondary | ICD-10-CM | POA: Diagnosis not present

## 2017-03-12 DIAGNOSIS — M5416 Radiculopathy, lumbar region: Secondary | ICD-10-CM | POA: Diagnosis not present

## 2017-03-17 DIAGNOSIS — Z23 Encounter for immunization: Secondary | ICD-10-CM | POA: Diagnosis not present

## 2017-03-28 DIAGNOSIS — F4312 Post-traumatic stress disorder, chronic: Secondary | ICD-10-CM | POA: Diagnosis not present

## 2017-04-02 DIAGNOSIS — G35 Multiple sclerosis: Secondary | ICD-10-CM | POA: Diagnosis not present

## 2017-04-02 DIAGNOSIS — M791 Myalgia, unspecified site: Secondary | ICD-10-CM | POA: Diagnosis not present

## 2017-04-03 DIAGNOSIS — G35 Multiple sclerosis: Secondary | ICD-10-CM | POA: Diagnosis not present

## 2017-04-03 DIAGNOSIS — E278 Other specified disorders of adrenal gland: Secondary | ICD-10-CM | POA: Diagnosis not present

## 2017-04-04 DIAGNOSIS — F4312 Post-traumatic stress disorder, chronic: Secondary | ICD-10-CM | POA: Diagnosis not present

## 2017-04-09 ENCOUNTER — Other Ambulatory Visit: Payer: Self-pay | Admitting: *Deleted

## 2017-04-09 MED ORDER — OMEPRAZOLE 40 MG PO CPDR: 40.0000 mg | DELAYED_RELEASE_CAPSULE | Freq: Every day | ORAL | 3 refills | Status: DC

## 2017-04-11 DIAGNOSIS — F4312 Post-traumatic stress disorder, chronic: Secondary | ICD-10-CM | POA: Diagnosis not present

## 2017-04-23 ENCOUNTER — Encounter: Payer: Self-pay | Admitting: Family Medicine

## 2017-04-23 DIAGNOSIS — D3502 Benign neoplasm of left adrenal gland: Secondary | ICD-10-CM | POA: Diagnosis not present

## 2017-04-23 DIAGNOSIS — K449 Diaphragmatic hernia without obstruction or gangrene: Secondary | ICD-10-CM | POA: Diagnosis not present

## 2017-04-23 DIAGNOSIS — E278 Other specified disorders of adrenal gland: Secondary | ICD-10-CM | POA: Diagnosis not present

## 2017-04-23 DIAGNOSIS — K8020 Calculus of gallbladder without cholecystitis without obstruction: Secondary | ICD-10-CM | POA: Diagnosis not present

## 2017-04-23 DIAGNOSIS — G35 Multiple sclerosis: Secondary | ICD-10-CM | POA: Diagnosis not present

## 2017-04-25 DIAGNOSIS — F4312 Post-traumatic stress disorder, chronic: Secondary | ICD-10-CM | POA: Diagnosis not present

## 2017-05-02 ENCOUNTER — Encounter: Payer: Self-pay | Admitting: Family Medicine

## 2017-05-02 DIAGNOSIS — F4312 Post-traumatic stress disorder, chronic: Secondary | ICD-10-CM | POA: Diagnosis not present

## 2017-05-02 DIAGNOSIS — G35 Multiple sclerosis: Secondary | ICD-10-CM | POA: Diagnosis not present

## 2017-05-09 ENCOUNTER — Telehealth (INDEPENDENT_AMBULATORY_CARE_PROVIDER_SITE_OTHER): Payer: Self-pay | Admitting: Orthopaedic Surgery

## 2017-05-09 NOTE — Telephone Encounter (Signed)
Pt is out of town and she fell and injured the knee Dr.Xu did surgery on. Pt would like to know if plastic is in her knee or metal.Pt will also need a copy of medical records and would like to know how to obtain records with out having a medical release form.

## 2017-05-09 NOTE — Telephone Encounter (Signed)
Please advise.  Ashley Gomez see second part of message.

## 2017-05-09 NOTE — Telephone Encounter (Signed)
There's a plastic piece between two metal pieces.  Where is the patient?

## 2017-05-09 NOTE — Telephone Encounter (Signed)
We have to have the medical release signed, but we can easily email or fax it to her, she can print sign and send back.

## 2017-05-10 NOTE — Telephone Encounter (Signed)
Called patient back and advised on message below. She said she is in [place]. I will e-mail her the form (MEdical release form).

## 2017-05-18 DIAGNOSIS — J209 Acute bronchitis, unspecified: Secondary | ICD-10-CM | POA: Diagnosis not present

## 2017-05-18 DIAGNOSIS — J189 Pneumonia, unspecified organism: Secondary | ICD-10-CM | POA: Diagnosis not present

## 2017-05-18 DIAGNOSIS — Z720 Tobacco use: Secondary | ICD-10-CM | POA: Diagnosis not present

## 2017-05-21 DIAGNOSIS — G43719 Chronic migraine without aura, intractable, without status migrainosus: Secondary | ICD-10-CM | POA: Diagnosis not present

## 2017-05-21 DIAGNOSIS — M797 Fibromyalgia: Secondary | ICD-10-CM | POA: Diagnosis not present

## 2017-05-21 DIAGNOSIS — G35 Multiple sclerosis: Secondary | ICD-10-CM | POA: Diagnosis not present

## 2017-05-23 DIAGNOSIS — F4312 Post-traumatic stress disorder, chronic: Secondary | ICD-10-CM | POA: Diagnosis not present

## 2017-05-28 DIAGNOSIS — F4312 Post-traumatic stress disorder, chronic: Secondary | ICD-10-CM | POA: Diagnosis not present

## 2017-06-06 DIAGNOSIS — F4312 Post-traumatic stress disorder, chronic: Secondary | ICD-10-CM | POA: Diagnosis not present

## 2017-06-09 DIAGNOSIS — J209 Acute bronchitis, unspecified: Secondary | ICD-10-CM | POA: Diagnosis not present

## 2017-06-09 DIAGNOSIS — Z720 Tobacco use: Secondary | ICD-10-CM | POA: Diagnosis not present

## 2017-06-13 ENCOUNTER — Other Ambulatory Visit (HOSPITAL_COMMUNITY): Payer: Self-pay | Admitting: Medical

## 2017-06-13 DIAGNOSIS — F331 Major depressive disorder, recurrent, moderate: Secondary | ICD-10-CM

## 2017-06-15 ENCOUNTER — Encounter: Payer: Self-pay | Admitting: Family Medicine

## 2017-06-15 DIAGNOSIS — F419 Anxiety disorder, unspecified: Secondary | ICD-10-CM | POA: Diagnosis not present

## 2017-06-15 DIAGNOSIS — Z86718 Personal history of other venous thrombosis and embolism: Secondary | ICD-10-CM | POA: Diagnosis not present

## 2017-06-15 DIAGNOSIS — F4310 Post-traumatic stress disorder, unspecified: Secondary | ICD-10-CM | POA: Diagnosis not present

## 2017-06-15 DIAGNOSIS — K219 Gastro-esophageal reflux disease without esophagitis: Secondary | ICD-10-CM | POA: Diagnosis not present

## 2017-06-15 DIAGNOSIS — G35 Multiple sclerosis: Secondary | ICD-10-CM | POA: Diagnosis not present

## 2017-06-15 DIAGNOSIS — Z79899 Other long term (current) drug therapy: Secondary | ICD-10-CM | POA: Diagnosis not present

## 2017-06-15 DIAGNOSIS — R2242 Localized swelling, mass and lump, left lower limb: Secondary | ICD-10-CM | POA: Diagnosis not present

## 2017-06-15 DIAGNOSIS — M79605 Pain in left leg: Secondary | ICD-10-CM | POA: Diagnosis not present

## 2017-06-15 DIAGNOSIS — M70862 Other soft tissue disorders related to use, overuse and pressure, left lower leg: Secondary | ICD-10-CM | POA: Diagnosis not present

## 2017-06-15 DIAGNOSIS — F329 Major depressive disorder, single episode, unspecified: Secondary | ICD-10-CM | POA: Diagnosis not present

## 2017-06-15 DIAGNOSIS — R600 Localized edema: Secondary | ICD-10-CM | POA: Diagnosis not present

## 2017-06-18 DIAGNOSIS — F4312 Post-traumatic stress disorder, chronic: Secondary | ICD-10-CM | POA: Diagnosis not present

## 2017-07-02 DIAGNOSIS — F4312 Post-traumatic stress disorder, chronic: Secondary | ICD-10-CM | POA: Diagnosis not present

## 2017-07-05 ENCOUNTER — Telehealth (INDEPENDENT_AMBULATORY_CARE_PROVIDER_SITE_OTHER): Payer: Self-pay | Admitting: Orthopaedic Surgery

## 2017-07-05 NOTE — Telephone Encounter (Signed)
Ashley Gomez @ [facility] called questioning the records received. She said she was missing and MRI report, I told her we did not have that and I verified that what I faxed was the records within the requested date range. She stated she was going to send a revised request that would include all dates of service.

## 2017-07-06 DIAGNOSIS — Z86718 Personal history of other venous thrombosis and embolism: Secondary | ICD-10-CM | POA: Diagnosis not present

## 2017-07-06 DIAGNOSIS — G35 Multiple sclerosis: Secondary | ICD-10-CM | POA: Diagnosis not present

## 2017-07-06 DIAGNOSIS — F4310 Post-traumatic stress disorder, unspecified: Secondary | ICD-10-CM | POA: Diagnosis not present

## 2017-07-11 DIAGNOSIS — F4312 Post-traumatic stress disorder, chronic: Secondary | ICD-10-CM | POA: Diagnosis not present

## 2017-07-21 LAB — INSULIN, RANDOM: INSULIN: 7.9

## 2017-07-23 DIAGNOSIS — F4312 Post-traumatic stress disorder, chronic: Secondary | ICD-10-CM | POA: Diagnosis not present

## 2017-09-03 DIAGNOSIS — G35 Multiple sclerosis: Secondary | ICD-10-CM | POA: Diagnosis not present

## 2017-09-03 DIAGNOSIS — R5383 Other fatigue: Secondary | ICD-10-CM | POA: Diagnosis not present

## 2017-09-03 DIAGNOSIS — R109 Unspecified abdominal pain: Secondary | ICD-10-CM | POA: Diagnosis not present

## 2017-09-03 DIAGNOSIS — R399 Unspecified symptoms and signs involving the genitourinary system: Secondary | ICD-10-CM | POA: Diagnosis not present

## 2017-09-03 DIAGNOSIS — Z1159 Encounter for screening for other viral diseases: Secondary | ICD-10-CM | POA: Diagnosis not present

## 2017-09-07 DIAGNOSIS — K760 Fatty (change of) liver, not elsewhere classified: Secondary | ICD-10-CM | POA: Insufficient documentation

## 2017-09-07 DIAGNOSIS — K8020 Calculus of gallbladder without cholecystitis without obstruction: Secondary | ICD-10-CM | POA: Diagnosis not present

## 2017-09-11 DIAGNOSIS — R1011 Right upper quadrant pain: Secondary | ICD-10-CM | POA: Diagnosis not present

## 2017-09-11 DIAGNOSIS — N200 Calculus of kidney: Secondary | ICD-10-CM | POA: Insufficient documentation

## 2017-09-11 DIAGNOSIS — R110 Nausea: Secondary | ICD-10-CM | POA: Diagnosis not present

## 2017-09-11 DIAGNOSIS — D3502 Benign neoplasm of left adrenal gland: Secondary | ICD-10-CM | POA: Insufficient documentation

## 2017-09-11 DIAGNOSIS — F17210 Nicotine dependence, cigarettes, uncomplicated: Secondary | ICD-10-CM | POA: Diagnosis not present

## 2017-09-11 DIAGNOSIS — K5730 Diverticulosis of large intestine without perforation or abscess without bleeding: Secondary | ICD-10-CM | POA: Insufficient documentation

## 2017-09-15 DIAGNOSIS — Y9289 Other specified places as the place of occurrence of the external cause: Secondary | ICD-10-CM | POA: Diagnosis not present

## 2017-09-15 DIAGNOSIS — Y9389 Activity, other specified: Secondary | ICD-10-CM | POA: Diagnosis not present

## 2017-09-15 DIAGNOSIS — X58XXXA Exposure to other specified factors, initial encounter: Secondary | ICD-10-CM | POA: Diagnosis not present

## 2017-09-15 DIAGNOSIS — Z886 Allergy status to analgesic agent status: Secondary | ICD-10-CM | POA: Diagnosis not present

## 2017-09-15 DIAGNOSIS — S92351A Displaced fracture of fifth metatarsal bone, right foot, initial encounter for closed fracture: Secondary | ICD-10-CM | POA: Diagnosis not present

## 2017-09-15 DIAGNOSIS — G35 Multiple sclerosis: Secondary | ICD-10-CM | POA: Diagnosis not present

## 2017-09-15 DIAGNOSIS — F17210 Nicotine dependence, cigarettes, uncomplicated: Secondary | ICD-10-CM | POA: Diagnosis not present

## 2017-09-15 DIAGNOSIS — M79671 Pain in right foot: Secondary | ICD-10-CM | POA: Diagnosis not present

## 2017-09-15 DIAGNOSIS — Z885 Allergy status to narcotic agent status: Secondary | ICD-10-CM | POA: Diagnosis not present

## 2017-09-15 DIAGNOSIS — S92354A Nondisplaced fracture of fifth metatarsal bone, right foot, initial encounter for closed fracture: Secondary | ICD-10-CM | POA: Diagnosis not present

## 2017-09-17 DIAGNOSIS — M79671 Pain in right foot: Secondary | ICD-10-CM | POA: Diagnosis not present

## 2017-09-17 DIAGNOSIS — S92351D Displaced fracture of fifth metatarsal bone, right foot, subsequent encounter for fracture with routine healing: Secondary | ICD-10-CM | POA: Diagnosis not present

## 2017-09-20 DIAGNOSIS — F17200 Nicotine dependence, unspecified, uncomplicated: Secondary | ICD-10-CM | POA: Insufficient documentation

## 2017-09-20 DIAGNOSIS — K219 Gastro-esophageal reflux disease without esophagitis: Secondary | ICD-10-CM | POA: Insufficient documentation

## 2017-09-20 DIAGNOSIS — K8020 Calculus of gallbladder without cholecystitis without obstruction: Secondary | ICD-10-CM | POA: Diagnosis not present

## 2017-09-27 DIAGNOSIS — G35 Multiple sclerosis: Secondary | ICD-10-CM | POA: Diagnosis not present

## 2017-09-27 DIAGNOSIS — S92351D Displaced fracture of fifth metatarsal bone, right foot, subsequent encounter for fracture with routine healing: Secondary | ICD-10-CM | POA: Diagnosis not present

## 2017-09-27 DIAGNOSIS — M79671 Pain in right foot: Secondary | ICD-10-CM | POA: Diagnosis not present

## 2017-10-12 DIAGNOSIS — G43709 Chronic migraine without aura, not intractable, without status migrainosus: Secondary | ICD-10-CM | POA: Diagnosis not present

## 2017-10-12 DIAGNOSIS — G35 Multiple sclerosis: Secondary | ICD-10-CM | POA: Diagnosis not present

## 2017-10-12 DIAGNOSIS — R569 Unspecified convulsions: Secondary | ICD-10-CM | POA: Diagnosis not present

## 2017-10-13 HISTORY — PX: CHOLECYSTECTOMY: SHX55

## 2017-10-15 DIAGNOSIS — G35 Multiple sclerosis: Secondary | ICD-10-CM | POA: Diagnosis not present

## 2017-10-15 DIAGNOSIS — S92351D Displaced fracture of fifth metatarsal bone, right foot, subsequent encounter for fracture with routine healing: Secondary | ICD-10-CM | POA: Diagnosis not present

## 2017-10-15 DIAGNOSIS — M79671 Pain in right foot: Secondary | ICD-10-CM | POA: Diagnosis not present

## 2017-10-17 DIAGNOSIS — R05 Cough: Secondary | ICD-10-CM | POA: Diagnosis not present

## 2017-10-17 DIAGNOSIS — Z01818 Encounter for other preprocedural examination: Secondary | ICD-10-CM | POA: Diagnosis not present

## 2017-10-29 DIAGNOSIS — F418 Other specified anxiety disorders: Secondary | ICD-10-CM | POA: Diagnosis not present

## 2017-10-29 DIAGNOSIS — R001 Bradycardia, unspecified: Secondary | ICD-10-CM | POA: Diagnosis not present

## 2017-10-29 DIAGNOSIS — G35 Multiple sclerosis: Secondary | ICD-10-CM | POA: Diagnosis not present

## 2017-10-29 DIAGNOSIS — K8020 Calculus of gallbladder without cholecystitis without obstruction: Secondary | ICD-10-CM | POA: Diagnosis not present

## 2017-10-29 DIAGNOSIS — Z8673 Personal history of transient ischemic attack (TIA), and cerebral infarction without residual deficits: Secondary | ICD-10-CM | POA: Diagnosis not present

## 2017-10-29 DIAGNOSIS — K8010 Calculus of gallbladder with chronic cholecystitis without obstruction: Secondary | ICD-10-CM | POA: Diagnosis not present

## 2017-10-29 DIAGNOSIS — F329 Major depressive disorder, single episode, unspecified: Secondary | ICD-10-CM | POA: Diagnosis not present

## 2017-10-29 DIAGNOSIS — K219 Gastro-esophageal reflux disease without esophagitis: Secondary | ICD-10-CM | POA: Diagnosis not present

## 2017-10-29 DIAGNOSIS — J42 Unspecified chronic bronchitis: Secondary | ICD-10-CM | POA: Diagnosis not present

## 2017-10-29 DIAGNOSIS — Z8711 Personal history of peptic ulcer disease: Secondary | ICD-10-CM | POA: Diagnosis not present

## 2017-10-29 DIAGNOSIS — Z86718 Personal history of other venous thrombosis and embolism: Secondary | ICD-10-CM | POA: Diagnosis not present

## 2017-11-06 DIAGNOSIS — R05 Cough: Secondary | ICD-10-CM | POA: Diagnosis not present

## 2017-11-27 DIAGNOSIS — G43709 Chronic migraine without aura, not intractable, without status migrainosus: Secondary | ICD-10-CM | POA: Diagnosis not present

## 2017-11-27 DIAGNOSIS — G35 Multiple sclerosis: Secondary | ICD-10-CM | POA: Diagnosis not present

## 2017-11-27 DIAGNOSIS — R569 Unspecified convulsions: Secondary | ICD-10-CM | POA: Diagnosis not present

## 2017-12-20 ENCOUNTER — Telehealth: Payer: Self-pay

## 2017-12-20 DIAGNOSIS — G35 Multiple sclerosis: Secondary | ICD-10-CM | POA: Diagnosis not present

## 2017-12-20 DIAGNOSIS — Z885 Allergy status to narcotic agent status: Secondary | ICD-10-CM | POA: Diagnosis not present

## 2017-12-20 DIAGNOSIS — R112 Nausea with vomiting, unspecified: Secondary | ICD-10-CM | POA: Diagnosis not present

## 2017-12-20 DIAGNOSIS — R1013 Epigastric pain: Secondary | ICD-10-CM | POA: Diagnosis not present

## 2017-12-20 DIAGNOSIS — N200 Calculus of kidney: Secondary | ICD-10-CM | POA: Diagnosis not present

## 2017-12-20 DIAGNOSIS — K760 Fatty (change of) liver, not elsewhere classified: Secondary | ICD-10-CM | POA: Diagnosis not present

## 2017-12-20 DIAGNOSIS — K5730 Diverticulosis of large intestine without perforation or abscess without bleeding: Secondary | ICD-10-CM | POA: Diagnosis not present

## 2017-12-20 DIAGNOSIS — F17210 Nicotine dependence, cigarettes, uncomplicated: Secondary | ICD-10-CM | POA: Diagnosis not present

## 2017-12-20 DIAGNOSIS — R1011 Right upper quadrant pain: Secondary | ICD-10-CM | POA: Diagnosis not present

## 2017-12-20 DIAGNOSIS — R197 Diarrhea, unspecified: Secondary | ICD-10-CM | POA: Diagnosis not present

## 2017-12-20 DIAGNOSIS — Z86718 Personal history of other venous thrombosis and embolism: Secondary | ICD-10-CM | POA: Diagnosis not present

## 2017-12-20 DIAGNOSIS — Z79899 Other long term (current) drug therapy: Secondary | ICD-10-CM | POA: Diagnosis not present

## 2017-12-20 DIAGNOSIS — F419 Anxiety disorder, unspecified: Secondary | ICD-10-CM | POA: Diagnosis not present

## 2017-12-20 DIAGNOSIS — I10 Essential (primary) hypertension: Secondary | ICD-10-CM | POA: Diagnosis not present

## 2017-12-20 DIAGNOSIS — F329 Major depressive disorder, single episode, unspecified: Secondary | ICD-10-CM | POA: Diagnosis not present

## 2017-12-20 DIAGNOSIS — Z888 Allergy status to other drugs, medicaments and biological substances status: Secondary | ICD-10-CM | POA: Diagnosis not present

## 2017-12-20 NOTE — Telephone Encounter (Signed)
Pt called- she had her gall bladder removed 10-29-17 and has been very sick ever since. Pt reports being doubled over in pain, unable to eat, extreme stomach upset with nausea and diarrhea. Pt states she cannot stand the pain and is not sure where to go from here- especially since she still has not re-established with Dr Madilyn Fireman yet. Pt reports SX started after her surgery and have not resolved at all, only worsened over last couple months. Pt has tried taking several different antacids and no relied.  I advised pt she needs to go to ER to be evaluated. Pt agreeable and states she will go now. FYI to Dr Madilyn Fireman

## 2017-12-26 DIAGNOSIS — Z9049 Acquired absence of other specified parts of digestive tract: Secondary | ICD-10-CM | POA: Diagnosis not present

## 2017-12-26 DIAGNOSIS — R197 Diarrhea, unspecified: Secondary | ICD-10-CM | POA: Diagnosis not present

## 2017-12-26 DIAGNOSIS — R1011 Right upper quadrant pain: Secondary | ICD-10-CM | POA: Diagnosis not present

## 2017-12-26 DIAGNOSIS — K219 Gastro-esophageal reflux disease without esophagitis: Secondary | ICD-10-CM | POA: Diagnosis not present

## 2017-12-31 ENCOUNTER — Ambulatory Visit (HOSPITAL_COMMUNITY): Payer: Medicare Other | Admitting: Licensed Clinical Social Worker

## 2017-12-31 ENCOUNTER — Inpatient Hospital Stay: Payer: Self-pay | Admitting: Family Medicine

## 2017-12-31 ENCOUNTER — Ambulatory Visit: Payer: Self-pay | Admitting: Sports Medicine

## 2018-01-23 ENCOUNTER — Encounter: Payer: Self-pay | Admitting: Family Medicine

## 2018-01-23 ENCOUNTER — Ambulatory Visit (INDEPENDENT_AMBULATORY_CARE_PROVIDER_SITE_OTHER): Payer: Medicare Other | Admitting: Sports Medicine

## 2018-01-23 ENCOUNTER — Ambulatory Visit (HOSPITAL_COMMUNITY): Payer: Medicare Other | Admitting: Licensed Clinical Social Worker

## 2018-01-23 ENCOUNTER — Ambulatory Visit (INDEPENDENT_AMBULATORY_CARE_PROVIDER_SITE_OTHER): Payer: Medicare Other | Admitting: Family Medicine

## 2018-01-23 VITALS — BP 129/79 | HR 93

## 2018-01-23 VITALS — BP 129/79 | HR 93 | Ht 67.0 in | Wt 269.0 lb

## 2018-01-23 DIAGNOSIS — E278 Other specified disorders of adrenal gland: Secondary | ICD-10-CM | POA: Insufficient documentation

## 2018-01-23 DIAGNOSIS — M25512 Pain in left shoulder: Secondary | ICD-10-CM | POA: Diagnosis not present

## 2018-01-23 DIAGNOSIS — F341 Dysthymic disorder: Secondary | ICD-10-CM

## 2018-01-23 DIAGNOSIS — G35 Multiple sclerosis: Secondary | ICD-10-CM | POA: Diagnosis not present

## 2018-01-23 DIAGNOSIS — F4310 Post-traumatic stress disorder, unspecified: Secondary | ICD-10-CM

## 2018-01-23 DIAGNOSIS — G894 Chronic pain syndrome: Secondary | ICD-10-CM

## 2018-01-23 DIAGNOSIS — E279 Disorder of adrenal gland, unspecified: Secondary | ICD-10-CM

## 2018-01-23 DIAGNOSIS — M51369 Other intervertebral disc degeneration, lumbar region without mention of lumbar back pain or lower extremity pain: Secondary | ICD-10-CM

## 2018-01-23 DIAGNOSIS — F4321 Adjustment disorder with depressed mood: Secondary | ICD-10-CM

## 2018-01-23 DIAGNOSIS — M542 Cervicalgia: Secondary | ICD-10-CM

## 2018-01-23 DIAGNOSIS — F4329 Adjustment disorder with other symptoms: Secondary | ICD-10-CM

## 2018-01-23 DIAGNOSIS — M5136 Other intervertebral disc degeneration, lumbar region: Secondary | ICD-10-CM

## 2018-01-23 DIAGNOSIS — Z634 Disappearance and death of family member: Secondary | ICD-10-CM

## 2018-01-23 DIAGNOSIS — F411 Generalized anxiety disorder: Secondary | ICD-10-CM

## 2018-01-23 DIAGNOSIS — G8929 Other chronic pain: Secondary | ICD-10-CM | POA: Diagnosis not present

## 2018-01-23 DIAGNOSIS — Z23 Encounter for immunization: Secondary | ICD-10-CM | POA: Diagnosis not present

## 2018-01-23 MED ORDER — CELECOXIB 200 MG PO CAPS: ORAL_CAPSULE | ORAL | 2 refills | Status: DC

## 2018-01-23 MED ORDER — CLONAZEPAM 1 MG PO TABS: 1.0000 mg | ORAL_TABLET | Freq: Two times a day (BID) | ORAL | 0 refills | Status: DC | PRN

## 2018-01-23 MED ORDER — PREDNISONE 50 MG PO TABS
ORAL_TABLET | ORAL | 0 refills | Status: DC
Start: 2018-01-23 — End: 2018-01-23

## 2018-01-23 NOTE — Assessment & Plan Note (Signed)
5 days of prednisone. Rehab exercises given. If no better in a couple of weeks we will proceed with a lumbar epidural. Adding Celebrex.

## 2018-01-23 NOTE — Progress Notes (Signed)
Subjective:    CC: Several issues  HPI: Left shoulder pain: Localized over the deltoid, worse with abduction and overhead activities, moderate, persistent without radiation.  Low back pain: Axial discogenic pain with occasional radiation down the right leg but not past the ankle.  She has had some stool and urine leakage for the past few months but did have an MRI that did not show any evidence of cauda equina syndrome, no constitutional symptoms or trauma.  I reviewed the past medical history, family history, social history, surgical history, and allergies today and no changes were needed.  Please see the problem list section below in epic for further details.  Past Medical History: Past Medical History:  Diagnosis Date  . Anxiety   . Arthritis   . CVA (cerebral infarction)    Old CVA on MRI brain  . Depression   . DVT (deep venous thrombosis) ([place]) 83/41/9622   L basilic vein   . Facet hypertrophy of lumbar region    MRI 2007  . Fibromyalgia   . GERD (gastroesophageal reflux disease)   . Hypertension    no meds now, lost 50lbs  . Migraines   . MS (multiple sclerosis) ([place])   . Neuromuscular disorder ([place])   . Obesity   . PTSD (post-traumatic stress disorder)   . Seizures ([place]) 2011   no seizure since onset  . Sleep apnea    diagnosed 20 ago, lost wt no CPAP now  . Smoking   . Stroke [facility])    doesn't know when   Past Surgical History: Past Surgical History:  Procedure Laterality Date  . CHONDROPLASTY Left 12/30/2014   Procedure: CHONDROPLASTY;  Surgeon: Leandrew Koyanagi, MD;  Location: [facility];  Service: Orthopedics;  Laterality: Left;  . KNEE ARTHROSCOPY Left 12/30/2014   Procedure: LEFT KNEE ARTHROSCOPY WITH   CHONDROPLASTY;  Surgeon: Leandrew Koyanagi, MD;  Location: [facility];  Service: Orthopedics;  Laterality: Left;  . PARTIAL KNEE ARTHROPLASTY Left 08/03/2016   Procedure: LEFT UNICOMPARTMENTAL KNEE ARTHROPLASTY;  Surgeon: Leandrew Koyanagi,  MD;  Location: [facility] [place];  Service: Orthopedics;  Laterality: Left;  . TONSILLECTOMY    . TUBAL LIGATION  1992   Social History: Social History   Socioeconomic History  . Marital status: Single    Spouse name: Not on file  . Number of children: Not on file  . Years of education: Not on file  . Highest education level: Not on file  Occupational History  . Not on file  Social Needs  . Financial resource strain: Not on file  . Food insecurity:    Worry: Not on file    Inability: Not on file  . Transportation needs:    Medical: Not on file    Non-medical: Not on file  Tobacco Use  . Smoking status: Current Every Day Smoker    Packs/day: 1.00    Years: 25.00    Pack years: 25.00    Types: Cigarettes  . Smokeless tobacco: Never Used  . Tobacco comment: advised to d/c by 3 cigs per week.  Substance and Sexual Activity  . Alcohol use: No    Alcohol/week: 0.0 standard drinks    Comment: occasional  . Drug use: No  . Sexual activity: Yes    Birth control/protection: None, Post-menopausal  Lifestyle  . Physical activity:    Days per week: Not on file    Minutes per session: Not on file  . Stress: Not on file  Relationships  . Social connections:    Talks on phone: Not on file    Gets together: Not on file    Attends religious service: Not on file    Active member of club or organization: Not on file    Attends meetings of clubs or organizations: Not on file    Relationship status: Not on file  Other Topics Concern  . Not on file  Social History Narrative  . Not on file   Family History: Family History  Problem Relation Age of Onset  . Cancer Mother 47       melanoma  . Hypertension Sister   . Heart disease Sister        valve disease  . Depression Sister   . Diabetes Sister   . Sjogren's syndrome Sister    Allergies: Allergies  Allergen Reactions  . Ace Inhibitors     REACTION: cough  . Aspirin Other (See Comments)    Stomach Ulcer  . Atenolol Other (See  Comments)    Bradycardia  . Celexa [Citalopram Hydrobromide] Other (See Comments)    Dizziness Tolerated Lexapro   . Codeine Nausea Only  . Nitroglycerin Other (See Comments)    Drop in BP  . Phenytoin Swelling  . Sertraline Other (See Comments)    unknown  . Triamcinolone     Steroid flare after joint injection, use other steroid for injections   Medications: See med rec.  Review of Systems: No fevers, chills, night sweats, weight loss, chest pain, or shortness of breath.   Objective:    General: Well Developed, well nourished, and in no acute distress.  Neuro: Alert and oriented x3, extra-ocular muscles intact, sensation grossly intact.  HEENT: Normocephalic, atraumatic, pupils equal round reactive to light, neck supple, no masses, no lymphadenopathy, thyroid nonpalpable.  Skin: Warm and dry, no rashes. Cardiac: Regular rate and rhythm, no murmurs rubs or gallops, no lower extremity edema.  Respiratory: Clear to auscultation bilaterally. Not using accessory muscles, speaking in full sentences. Left shoulder: Inspection reveals no abnormalities, atrophy or asymmetry. Palpation is normal with no tenderness over AC joint or bicipital groove. ROM is full in all planes. Rotator cuff strength normal throughout. Positive Neer and Hawkin's tests, empty can. Speeds and Yergason's tests normal. No labral pathology noted with negative Obrien's, negative crank, negative clunk, and good stability. Normal scapular function observed. No painful arc and no drop arm sign. No apprehension sign  Procedure: Real-time Ultrasound Guided Injection of left subacromial bursa Device: GE Logiq E  Verbal informed consent obtained.  Time-out conducted.  Noted no overlying erythema, induration, or other signs of local infection.  Skin prepped in a sterile fashion.  Local anesthesia: Topical Ethyl chloride.  With sterile technique and under real time ultrasound guidance: Noted bursal effusion,  25-gauge needle advanced into this, I then injected 1 cc kenalog 40, 1 cc lidocaine, 1 cc bupivacaine. Completed without difficulty  Pain immediately resolved suggesting accurate placement of the medication.  Advised to call if fevers/chills, erythema, induration, drainage, or persistent bleeding.  Images permanently stored and available for review in the ultrasound unit.  Impression: Technically successful ultrasound guided injection.  Impression and Recommendations:    Left shoulder pain Impingement and glenohumeral related pain. We tried physical therapy last year. Celebrex was given last year. Considering severe symptoms we are going to do a left subacromial injection.  Lumbar degenerative disc disease 5 days of prednisone. Rehab exercises given. If no better in a couple of weeks  we will proceed with a lumbar epidural. Adding Celebrex.  I spent 40 minutes with this patient, greater than 50% was face-to-face time counseling regarding the above diagnoses,  I spent a great deal of time discussing the natural history of lumbar degenerative disc disease and future treatments, the above time was separate from the time spent performing the procedure. ___________________________________________ Gwen Her. Dianah Field, M.D., ABFM., CAQSM. Primary Care and [place] Instructor of [place] of [facility] of Medicine

## 2018-01-23 NOTE — Progress Notes (Signed)
Subjective:    Patient ID: Ashley Gomez, female    DOB: 10-May-1971, 47 y.o.   MRN: [id number]  HPI She also has chronic neck pain and myofascial pain as well as chronic pain syndrome.  She was previously on chronic narcotics with me but had a negative urine drug screen at that point we had actually referred her out.  At around the time that she actually moved out of her local area but she has now returned and wants [name] to reestablish care.  Recently dx with IBS with [facility]. She is on Bentyl and is supposed to be on Fibercon.    She was noted to have a mass on her adrenal gland back in December of 2018.Marland Kitchen   MS- not currently under treatment.  Needs a new referral to Neurology.    PTSD and dysthymic disorder-she was asking for refill on her Xanax today but actually has an appointment later today with behavioral health. PHQ- 9 score of 24 and GAD 7 score of 19.  Not well controlled sxs.    She is due for mammogram.    MS - was previously on tecfidera but thought is was causing her GI sxs.  She stopped the med but found out it was her Gallbladder and he had her GB removed in July and was diagnosed with IBS afterwards.  She is interested in starting medication again.   She aslo had a severe episdoe of bronchitis that took almost 8 months to resolve.    Review of Systems BP 129/79   Pulse 93   Ht 5\' 7"  (1.702 m)   Wt 269 lb (122 kg)   LMP 12/28/2014   BMI 42.13 kg/m     Allergies  Allergen Reactions  . Ace Inhibitors     REACTION: cough  . Aspirin Other (See Comments)    Stomach Ulcer  . Atenolol Other (See Comments)    Bradycardia  . Celexa [Citalopram Hydrobromide] Other (See Comments)    Dizziness Tolerated Lexapro   . Codeine Nausea Only  . Nitroglycerin Other (See Comments)    Drop in BP  . Phenytoin Swelling  . Sertraline Other (See Comments)    unknown  . Triamcinolone     Steroid flare after joint injection, use other steroid for injections    Past  Medical History:  Diagnosis Date  . Anxiety   . Arthritis   . CVA (cerebral infarction)    Old CVA on MRI brain  . Depression   . DVT (deep venous thrombosis) ([place]) 14/48/1856   L basilic vein   . Facet hypertrophy of lumbar region    MRI 2007  . Fibromyalgia   . GERD (gastroesophageal reflux disease)   . Hypertension    no meds now, lost 50lbs  . Migraines   . MS (multiple sclerosis) ([place])   . Neuromuscular disorder ([place])   . Obesity   . PTSD (post-traumatic stress disorder)   . Seizures ([place]) 2011   no seizure since onset  . Sleep apnea    diagnosed 20 ago, lost wt no CPAP now  . Smoking   . Stroke [facility])    doesn't know when    Past Surgical History:  Procedure Laterality Date  . CHOLECYSTECTOMY  10/2017  . CHONDROPLASTY Left 12/30/2014   Procedure: CHONDROPLASTY;  Surgeon: Leandrew Koyanagi, MD;  Location: [place];  Service: Orthopedics;  Laterality: Left;  . KNEE ARTHROSCOPY Left 12/30/2014   Procedure: LEFT KNEE ARTHROSCOPY  WITH   CHONDROPLASTY;  Surgeon: Leandrew Koyanagi, MD;  Location: [place];  Service: Orthopedics;  Laterality: Left;  . PARTIAL KNEE ARTHROPLASTY Left 08/03/2016   Procedure: LEFT UNICOMPARTMENTAL KNEE ARTHROPLASTY;  Surgeon: Leandrew Koyanagi, MD;  Location: [place];  Service: Orthopedics;  Laterality: Left;  . TONSILLECTOMY    . TUBAL LIGATION  1992    Social History   Socioeconomic History  . Marital status: Single    Spouse name: Not on file  . Number of children: Not on file  . Years of education: Not on file  . Highest education level: Not on file  Occupational History  . Not on file  Social Needs  . Financial resource strain: Not on file  . Food insecurity:    Worry: Not on file    Inability: Not on file  . Transportation needs:    Medical: Not on file    Non-medical: Not on file  Tobacco Use  . Smoking status: Current Every Day Smoker    Packs/day: 1.00    Years: 25.00    Pack years: 25.00    Types:  Cigarettes  . Smokeless tobacco: Never Used  . Tobacco comment: advised to d/c by 3 cigs per week.  Substance and Sexual Activity  . Alcohol use: No    Alcohol/week: 0.0 standard drinks    Comment: occasional  . Drug use: No  . Sexual activity: Yes    Birth control/protection: None, Post-menopausal  Lifestyle  . Physical activity:    Days per week: Not on file    Minutes per session: Not on file  . Stress: Not on file  Relationships  . Social connections:    Talks on phone: Not on file    Gets together: Not on file    Attends religious service: Not on file    Active member of club or organization: Not on file    Attends meetings of clubs or organizations: Not on file    Relationship status: Not on file  . Intimate partner violence:    Fear of current or ex partner: Not on file    Emotionally abused: Not on file    Physically abused: Not on file    Forced sexual activity: Not on file  Other Topics Concern  . Not on file  Social History Narrative  . Not on file    Family History  Problem Relation Age of Onset  . Cancer Mother 59       melanoma  . Hypertension Sister   . Heart disease Sister        valve disease  . Depression Sister   . Diabetes Sister   . Sjogren's syndrome Sister     Outpatient Encounter Medications as of 01/23/2018  Medication Sig  . acetaminophen (TYLENOL) 500 MG tablet Take 1,000 mg by mouth every 6 (six) hours as needed for mild pain or headache.  . clonazePAM (KLONOPIN) 1 MG tablet Take 1 tablet (1 mg total) by mouth 2 (two) times daily as needed for anxiety. NO EARLY REFILLS  . DULoxetine (CYMBALTA) 60 MG capsule Take 1 capsule (60 mg total) by mouth daily.  . methocarbamol (ROBAXIN) 750 MG tablet Take 1 tablet (750 mg total) by mouth 2 (two) times daily as needed for muscle spasms.  Marland Kitchen omeprazole (PRILOSEC) 40 MG capsule Take 1 capsule (40 mg total) by mouth daily.  . ondansetron (ZOFRAN) 4 MG tablet take 1 to 2 tablet by mouth every 8 hours  if needed for nausea and vomiting  . promethazine (PHENERGAN) 25 MG tablet take 1 tablet by mouth every 6 hours if needed for nausea  . SUMAtriptan (IMITREX) 100 MG tablet Take 1 tablet (100 mg total) by mouth every 2 (two) hours as needed for migraine. No more than 2 in 24 hours.  . topiramate (TOPAMAX) 200 MG tablet Take 1 tablet (200 mg total) by mouth 2 (two) times daily.  . [DISCONTINUED] ARIPiprazole (ABILIFY) 10 MG tablet Take 1 tablet (10 mg total) by mouth daily.  . [DISCONTINUED] celecoxib (CELEBREX) 200 MG capsule take 1 to 2 capsules by mouth once daily if needed for pain  . [DISCONTINUED] clonazePAM (KLONOPIN) 1 MG tablet Take 1 tablet (1 mg total) by mouth 3 (three) times daily as needed for anxiety. NO EARLY REFILLS  . [DISCONTINUED] cyclobenzaprine (FLEXERIL) 10 MG tablet Take 10 mg by mouth 3 (three) times daily as needed for muscle spasms.  . [DISCONTINUED] HYDROcodone-acetaminophen (NORCO) 7.5-325 MG tablet 1 tab po daily prn  . [DISCONTINUED] metFORMIN (GLUCOPHAGE) 500 MG tablet   . [DISCONTINUED] pregabalin (LYRICA) 100 MG capsule Take 1 capsule (100 mg total) by mouth 3 (three) times daily. No early refills   No facility-administered encounter medications on file as of 01/23/2018.          Objective:   Physical Exam  Constitutional: She is oriented to person, place, and time. She appears well-developed and well-nourished.  HENT:  Head: Normocephalic and atraumatic.  Cardiovascular: Normal rate, regular rhythm and normal heart sounds.  Pulmonary/Chest: Effort normal and breath sounds normal.  Neurological: She is alert and oriented to person, place, and time.  Skin: Skin is warm and dry.  Psychiatric: She has a normal mood and affect. Her behavior is normal.          Assessment & Plan:  Pain syndrome/chronic neck pain-we will refer for pain management locally.    Multiple sclerosis-we will place new referral to neurology since she is not currently undergoing  any type of treatment.  She also has a history of seizure disorder so I think it is prudent for her to establish with a neurologist.  Adrenal mass - Plan to repeat imaging in 04/2018.  Will view hard copy images brought by patient.   PTSD - will refill xanax. Has  appt with therapist later today. Encouraged her to schedule with psychiatry.    Seizure D/O- need to refill gabapentin but need to clarify dose.  Marland Kitchen
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
## 2018-01-23 NOTE — Assessment & Plan Note (Signed)
Impingement and glenohumeral related pain. We tried physical therapy last year. Celebrex was given last year. Considering severe symptoms we are going to do a left subacromial injection.

## 2018-01-31 ENCOUNTER — Ambulatory Visit: Payer: Medicare Other | Admitting: Neurology

## 2018-02-06 ENCOUNTER — Ambulatory Visit (HOSPITAL_COMMUNITY): Payer: Self-pay | Admitting: Licensed Clinical Social Worker

## 2018-02-12 ENCOUNTER — Telehealth: Payer: Self-pay | Admitting: *Deleted

## 2018-02-12 NOTE — Telephone Encounter (Signed)
lvm asking pt's daughter to have her mother to contact our office. Informed her that we have attempted numerous times to reach her and have been unable to contact her or leave a vm. Advised that we have been trying to inform her of a referral that was placed for her.Ashley Gomez, [place]

## 2018-02-13 ENCOUNTER — Ambulatory Visit: Payer: Medicare Other | Admitting: Sports Medicine

## 2018-02-14 ENCOUNTER — Ambulatory Visit: Payer: Medicare Other | Admitting: Neurology

## 2018-02-15 ENCOUNTER — Ambulatory Visit (INDEPENDENT_AMBULATORY_CARE_PROVIDER_SITE_OTHER): Payer: Medicare Other

## 2018-02-15 ENCOUNTER — Encounter: Payer: Self-pay | Admitting: Family Medicine

## 2018-02-15 ENCOUNTER — Ambulatory Visit (INDEPENDENT_AMBULATORY_CARE_PROVIDER_SITE_OTHER): Payer: Medicare Other | Admitting: Family Medicine

## 2018-02-15 ENCOUNTER — Encounter: Payer: Self-pay | Admitting: Neurology

## 2018-02-15 ENCOUNTER — Telehealth: Payer: Self-pay | Admitting: Family Medicine

## 2018-02-15 VITALS — BP 150/84 | HR 70 | Ht 67.0 in | Wt 258.0 lb

## 2018-02-15 DIAGNOSIS — G43019 Migraine without aura, intractable, without status migrainosus: Secondary | ICD-10-CM

## 2018-02-15 DIAGNOSIS — R0789 Other chest pain: Secondary | ICD-10-CM | POA: Diagnosis not present

## 2018-02-15 DIAGNOSIS — R9431 Abnormal electrocardiogram [ECG] [EKG]: Secondary | ICD-10-CM

## 2018-02-15 DIAGNOSIS — R079 Chest pain, unspecified: Secondary | ICD-10-CM | POA: Diagnosis not present

## 2018-02-15 LAB — COMPLETE METABOLIC PANEL WITH GFR
AG Ratio: 1.6 (calc) (ref 1.0–2.5)
ALT: 6 U/L (ref 6–29)
AST: 12 U/L (ref 10–35)
Albumin: 4.1 g/dL (ref 3.6–5.1)
Alkaline phosphatase (APISO): 74 U/L (ref 33–115)
BUN: 15 mg/dL (ref 7–25)
CO2: 29 mmol/L (ref 20–32)
Calcium: 9.3 mg/dL (ref 8.6–10.2)
Chloride: 103 mmol/L (ref 98–110)
Creat: 0.85 mg/dL (ref 0.50–1.10)
GFR, Est African American: 93 mL/min/{1.73_m2} (ref >=60)
GFR, Est Non African American: 80 mL/min/{1.73_m2} (ref >=60)
Globulin: 2.5 g/dL (calc) (ref 1.9–3.7)
Glucose, Bld: 93 mg/dL (ref 65–99)
Potassium: 4 mmol/L (ref 3.5–5.3)
Sodium: 139 mmol/L (ref 135–146)
Total Bilirubin: 0.4 mg/dL (ref 0.2–1.2)
Total Protein: 6.6 g/dL (ref 6.1–8.1)

## 2018-02-15 LAB — CBC WITH DIFFERENTIAL/PLATELET
Basophils Absolute: 32 cells/uL (ref 0–200)
Basophils Relative: 0.5 %
Eosinophils Absolute: 77 cells/uL (ref 15–500)
Eosinophils Relative: 1.2 %
HCT: 46.6 % — ABNORMAL HIGH (ref 35.0–45.0)
Hemoglobin: 15.2 g/dL (ref 11.7–15.5)
Lymphs Abs: 1760 cells/uL (ref 850–3900)
MCH: 27.9 pg (ref 27.0–33.0)
MCHC: 32.6 g/dL (ref 32.0–36.0)
MCV: 85.7 fL (ref 80.0–100.0)
MPV: 11.3 fL (ref 7.5–12.5)
Monocytes Relative: 6.2 %
Neutro Abs: 4134 cells/uL (ref 1500–7800)
Neutrophils Relative %: 64.6 %
Platelets: 254 10*3/uL (ref 140–400)
RBC: 5.44 10*6/uL — ABNORMAL HIGH (ref 3.80–5.10)
RDW: 13.4 % (ref 11.0–15.0)
Total Lymphocyte: 27.5 %
WBC mixed population: 397 cells/uL (ref 200–950)
WBC: 6.4 10*3/uL (ref 3.8–10.8)

## 2018-02-15 LAB — TSH: TSH: 0.53 mIU/L

## 2018-02-15 MED ORDER — KETOROLAC TROMETHAMINE 60 MG/2ML IM SOLN
60.0000 mg | Freq: Once | INTRAMUSCULAR | Status: AC
  Administered 2018-02-15: 60 mg via INTRAMUSCULAR

## 2018-02-15 NOTE — Telephone Encounter (Signed)
Attempted to contact Pt, phone disconnects. No answer. No VM.   Wanted to triage for appt scheduled today stating Pt has been having chest pain.

## 2018-02-15 NOTE — Progress Notes (Signed)
Subjective:    Patient ID: Ashley Gomez, female    DOB: 13-Mar-1971, 47 y.o.   MRN: [id number]  HPI 47 year old female comes in today complaining of not feeling well.  Really starting last Friday she had a really bad severe migraine headache.  She said she was feeling hot and cold most like sweats and then she would get chills.  By the following day she was feeling a little nauseated.  She says the headache has never really gone away since Friday night but then on Wednesday night she actually felt like she was having some bad reflux she felt like she was having chest pain in the middle of her chest radiating into the middle of her back.  Not worse on the right compared to the left really just, midline going between the shoulder blades.  She Artie takes omeprazole and Zantac daily and says she also took some antacids and it really just did not help.  The episode lasted about 20 minutes and it started right as she had just laid down on her bed.  She says she felt extremely tired afterwards.  But the pain was intense enough that she almost called EMS but then changed her mind and did not.  She did feel nauseated with it.  She denies any acute vomiting.  Though she does occasionally vomit since having had her gallbladder out years ago.  She still feels a little tender in her chest.  No prior history of coronary artery disease.  Does have report a prior history of gastric ulcer.  But she does take daily PPI therapy.   Review of Systems   BP (!) 150/84   Pulse 70   Ht 5\' 7"  (1.702 m)   Wt 258 lb (117 kg)   LMP 12/28/2014   SpO2 99%   BMI 40.41 kg/m     Allergies  Allergen Reactions  . Ace Inhibitors     REACTION: cough  . Aspirin Other (See Comments)    Stomach Ulcer  . Atenolol Other (See Comments)    Bradycardia  . Celexa [Citalopram Hydrobromide] Other (See Comments)    Dizziness Tolerated Lexapro   . Codeine Nausea Only  . Nitroglycerin Other (See Comments)    Drop in BP  .  Phenytoin Swelling  . Sertraline Other (See Comments)    unknown  . Triamcinolone     Steroid flare after joint injection, use other steroid for injections    Past Medical History:  Diagnosis Date  . Anxiety   . Arthritis   . CVA (cerebral infarction)    Old CVA on MRI brain  . Depression   . DVT (deep venous thrombosis) ([place]) 19/37/9024   L basilic vein   . Facet hypertrophy of lumbar region    MRI 2007  . Fibromyalgia   . GERD (gastroesophageal reflux disease)   . Hypertension    no meds now, lost 50lbs  . Migraines   . MS (multiple sclerosis) ([place])   . Neuromuscular disorder ([place])   . Obesity   . PTSD (post-traumatic stress disorder)   . Seizures ([place]) 2011   no seizure since onset  . Sleep apnea    diagnosed 20 ago, lost wt no CPAP now  . Smoking   . Stroke [facility])    doesn't know when    Past Surgical History:  Procedure Laterality Date  . CHOLECYSTECTOMY  10/2017  . CHONDROPLASTY Left 12/30/2014   Procedure: CHONDROPLASTY;  Surgeon: Leandrew Koyanagi, MD;  Location: [facility];  Service: Orthopedics;  Laterality: Left;  . KNEE ARTHROSCOPY Left 12/30/2014   Procedure: LEFT KNEE ARTHROSCOPY WITH   CHONDROPLASTY;  Surgeon: Leandrew Koyanagi, MD;  Location: [facility];  Service: Orthopedics;  Laterality: Left;  . PARTIAL KNEE ARTHROPLASTY Left 08/03/2016   Procedure: LEFT UNICOMPARTMENTAL KNEE ARTHROPLASTY;  Surgeon: Leandrew Koyanagi, MD;  Location: [facility];  Service: Orthopedics;  Laterality: Left;  . TONSILLECTOMY    . TUBAL LIGATION  1992    Social History   Socioeconomic History  . Marital status: Single    Spouse name: Not on file  . Number of children: Not on file  . Years of education: Not on file  . Highest education level: Not on file  Occupational History  . Not on file  Social Needs  . Financial resource strain: Not on file  . Food insecurity:    Worry: Not on file    Inability: Not on file  . Transportation needs:    Medical: Not  on file    Non-medical: Not on file  Tobacco Use  . Smoking status: Current Every Day Smoker    Packs/day: 1.00    Years: 25.00    Pack years: 25.00    Types: Cigarettes  . Smokeless tobacco: Never Used  . Tobacco comment: advised to d/c by 3 cigs per week.  Substance and Sexual Activity  . Alcohol use: No    Alcohol/week: 0.0 standard drinks    Comment: occasional  . Drug use: No  . Sexual activity: Yes    Birth control/protection: None, Post-menopausal  Lifestyle  . Physical activity:    Days per week: Not on file    Minutes per session: Not on file  . Stress: Not on file  Relationships  . Social connections:    Talks on phone: Not on file    Gets together: Not on file    Attends religious service: Not on file    Active member of club or organization: Not on file    Attends meetings of clubs or organizations: Not on file    Relationship status: Not on file  . Intimate partner violence:    Fear of current or ex partner: Not on file    Emotionally abused: Not on file    Physically abused: Not on file    Forced sexual activity: Not on file  Other Topics Concern  . Not on file  Social History Narrative  . Not on file    Family History  Problem Relation Age of Onset  . Cancer Mother 91       melanoma  . Hypertension Sister   . Heart disease Sister        valve disease  . Depression Sister   . Diabetes Sister   . Sjogren's syndrome Sister     Outpatient Encounter Medications as of 02/15/2018  Medication Sig  . acetaminophen (TYLENOL) 500 MG tablet Take 1,000 mg by mouth every 6 (six) hours as needed for mild pain or headache.  . celecoxib (CELEBREX) 200 MG capsule One to 2 tablets by mouth daily as needed for pain.  . clonazePAM (KLONOPIN) 1 MG tablet Take 1 tablet (1 mg total) by mouth 2 (two) times daily as needed for anxiety. NO EARLY REFILLS  . dicyclomine (BENTYL) 20 MG tablet Take by mouth.  . DULoxetine (CYMBALTA) 60 MG capsule Take 1 capsule (60 mg total)  by mouth daily.  Marland Kitchen gabapentin (NEURONTIN) 300  MG capsule Take by mouth.  Marland Kitchen omeprazole (PRILOSEC) 40 MG capsule Take 1 capsule (40 mg total) by mouth daily.  . ondansetron (ZOFRAN) 4 MG tablet take 1 to 2 tablet by mouth every 8 hours if needed for nausea and vomiting  . promethazine (PHENERGAN) 25 MG tablet take 1 tablet by mouth every 6 hours if needed for nausea  . ranitidine (ZANTAC) 150 MG tablet Take by mouth.  . SUMAtriptan (IMITREX) 100 MG tablet Take 1 tablet (100 mg total) by mouth every 2 (two) hours as needed for migraine. No more than 2 in 24 hours.  . topiramate (TOPAMAX) 200 MG tablet Take 1 tablet (200 mg total) by mouth 2 (two) times daily.  . [DISCONTINUED] methocarbamol (ROBAXIN) 750 MG tablet Take 1 tablet (750 mg total) by mouth 2 (two) times daily as needed for muscle spasms.  . [EXPIRED] ketorolac (TORADOL) injection 60 mg    No facility-administered encounter medications on file as of 02/15/2018.           Objective:   Physical Exam  Constitutional: She is oriented to person, place, and time. She appears well-developed and well-nourished.  HENT:  Head: Normocephalic and atraumatic.  Right Ear: External ear normal.  Left Ear: External ear normal.  Nose: Nose normal.  Mouth/Throat: Oropharynx is clear and moist.  TMs and canals are clear.   Eyes: Pupils are equal, round, and reactive to light. Conjunctivae and EOM are normal.  Neck: Neck supple. No thyromegaly present.  Cardiovascular: Normal rate, regular rhythm and normal heart sounds.  Pulmonary/Chest: Effort normal and breath sounds normal. She has no wheezes.  Abdominal: Soft. Bowel sounds are normal. She exhibits no distension. There is no tenderness. There is no guarding.  Lymphadenopathy:    She has no cervical adenopathy.  Neurological: She is alert and oriented to person, place, and time.  Skin: Skin is warm and dry.  Psychiatric: She has a normal mood and affect. Her behavior is normal.         Assessment & Plan:  Atypical chest pain - No pain today. oNly had once episode 2 days ago for 20 min while lying down. Does have hx of reflux but is on PPI therapy.  EKG shows rate of 69 bpm, inverted T waves in V2, V3, and V4.  She is no longer having chest pain we will do some additional labs and work-up as well as chest x-ray today.  Because of EKG changes we will try to get her in with cardiology next week.  I warned her that if she experience is any chest pain whatsoever that she is to go to the emergency department immediately over the weekend.  Consider could be stress related.  For now continue with PPI  INtractable migraine HA - given toradol IM for acute headaches. W

## 2018-02-16 ENCOUNTER — Other Ambulatory Visit: Payer: Self-pay | Admitting: Sports Medicine

## 2018-02-16 DIAGNOSIS — M5136 Other intervertebral disc degeneration, lumbar region: Secondary | ICD-10-CM

## 2018-02-17 ENCOUNTER — Other Ambulatory Visit: Payer: Self-pay | Admitting: Family Medicine

## 2018-02-18 NOTE — Addendum Note (Signed)
Addended by: Huel Cote on: 02/18/2018 08:39 AM   Modules accepted: Orders

## 2018-02-19 ENCOUNTER — Encounter: Payer: Self-pay | Admitting: Family Medicine

## 2018-02-19 MED ORDER — CLONAZEPAM 1 MG PO TABS: 1.0000 mg | ORAL_TABLET | Freq: Two times a day (BID) | ORAL | 3 refills | Status: DC | PRN

## 2018-02-19 MED ORDER — SUMATRIPTAN SUCCINATE 100 MG PO TABS: 100.0000 mg | ORAL_TABLET | ORAL | 6 refills | Status: DC | PRN

## 2018-02-20 ENCOUNTER — Encounter: Payer: Self-pay | Admitting: Family Medicine

## 2018-02-22 ENCOUNTER — Other Ambulatory Visit: Payer: Self-pay | Admitting: *Deleted

## 2018-02-22 ENCOUNTER — Encounter: Payer: Self-pay | Admitting: Family Medicine

## 2018-02-22 MED ORDER — ONDANSETRON HCL 4 MG PO TABS: ORAL_TABLET | ORAL | 1 refills | Status: DC

## 2018-02-25 MED ORDER — GABAPENTIN 300 MG PO CAPS: 300.0000 mg | ORAL_CAPSULE | Freq: Three times a day (TID) | ORAL | 1 refills | Status: DC

## 2018-02-25 MED ORDER — OMEPRAZOLE 40 MG PO CPDR: 40.0000 mg | DELAYED_RELEASE_CAPSULE | Freq: Every day | ORAL | 3 refills | Status: DC

## 2018-02-25 NOTE — Addendum Note (Signed)
Addended by: Beatrice Lecher D on: 02/25/2018 04:17 PM   Modules accepted: Orders

## 2018-02-26 DIAGNOSIS — M069 Rheumatoid arthritis, unspecified: Secondary | ICD-10-CM | POA: Insufficient documentation

## 2018-02-26 DIAGNOSIS — Z973 Presence of spectacles and contact lenses: Secondary | ICD-10-CM | POA: Insufficient documentation

## 2018-03-01 ENCOUNTER — Ambulatory Visit (INDEPENDENT_AMBULATORY_CARE_PROVIDER_SITE_OTHER): Payer: Medicare Other

## 2018-03-01 ENCOUNTER — Other Ambulatory Visit: Payer: Self-pay | Admitting: Family Medicine

## 2018-03-01 ENCOUNTER — Encounter (HOSPITAL_COMMUNITY): Payer: Self-pay | Admitting: Psychiatry

## 2018-03-01 ENCOUNTER — Ambulatory Visit (INDEPENDENT_AMBULATORY_CARE_PROVIDER_SITE_OTHER): Payer: Medicare Other | Admitting: Psychiatry

## 2018-03-01 VITALS — BP 134/86 | HR 68 | Ht 67.0 in | Wt 262.0 lb

## 2018-03-01 DIAGNOSIS — F411 Generalized anxiety disorder: Secondary | ICD-10-CM

## 2018-03-01 DIAGNOSIS — G894 Chronic pain syndrome: Secondary | ICD-10-CM | POA: Diagnosis not present

## 2018-03-01 DIAGNOSIS — F4310 Post-traumatic stress disorder, unspecified: Secondary | ICD-10-CM | POA: Diagnosis not present

## 2018-03-01 DIAGNOSIS — Z634 Disappearance and death of family member: Secondary | ICD-10-CM

## 2018-03-01 DIAGNOSIS — F4321 Adjustment disorder with depressed mood: Secondary | ICD-10-CM

## 2018-03-01 DIAGNOSIS — F4329 Adjustment disorder with other symptoms: Secondary | ICD-10-CM

## 2018-03-01 DIAGNOSIS — Z1231 Encounter for screening mammogram for malignant neoplasm of breast: Secondary | ICD-10-CM

## 2018-03-01 DIAGNOSIS — F331 Major depressive disorder, recurrent, moderate: Secondary | ICD-10-CM | POA: Diagnosis not present

## 2018-03-01 DIAGNOSIS — F4381 Prolonged grief disorder: Secondary | ICD-10-CM

## 2018-03-01 MED ORDER — DULOXETINE HCL 20 MG PO CPEP: 20.0000 mg | ORAL_CAPSULE | Freq: Every day | ORAL | 1 refills | Status: DC
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
## 2018-03-04 ENCOUNTER — Ambulatory Visit (INDEPENDENT_AMBULATORY_CARE_PROVIDER_SITE_OTHER): Payer: Medicare Other | Admitting: Licensed Clinical Social Worker

## 2018-03-04 DIAGNOSIS — F4310 Post-traumatic stress disorder, unspecified: Secondary | ICD-10-CM

## 2018-03-04 DIAGNOSIS — F331 Major depressive disorder, recurrent, moderate: Secondary | ICD-10-CM | POA: Diagnosis not present

## 2018-03-04 DIAGNOSIS — G894 Chronic pain syndrome: Secondary | ICD-10-CM

## 2018-03-04 DIAGNOSIS — F411 Generalized anxiety disorder: Secondary | ICD-10-CM | POA: Diagnosis not present

## 2018-03-04 DIAGNOSIS — F4321 Adjustment disorder with depressed mood: Secondary | ICD-10-CM

## 2018-03-04 DIAGNOSIS — Z634 Disappearance and death of family member: Secondary | ICD-10-CM | POA: Diagnosis not present

## 2018-03-04 DIAGNOSIS — F4329 Adjustment disorder with other symptoms: Secondary | ICD-10-CM

## 2018-03-04 DIAGNOSIS — F4381 Prolonged grief disorder: Secondary | ICD-10-CM

## 2018-03-04 NOTE — Progress Notes (Signed)
   THERAPIST PROGRESS NOTE  Session Time: 11:06 AM to 12:01 PM  Participation Level: Active  Behavioral Response: CasualAlertDysphoric  Type of Therapy: Individual Therapy  Treatment Goals addressed:  Patient work on Radiographer, therapeutic for stress management, decrease in anxiety and depression, coping   Interventions: Solution Focused, Strength-based, Supportive and Other: coping  Summary: Ashley Gomez is a 47 y.o. female who presents with major depressive disorder, recurrent, moderate, generalized anxiety disorder PTSD, complicated grief, pain syndrome  Suicidal/Homicidal: No  Therapist Response: Patient checked in and shared doctor Increased Cymbalta, also wants her to cut down on beno and patient shares she understands. Patient shared that son's girlfriend and and their children are two of her stressors. Adain, is 7 and has ADHD, [name], girlfriend does not consistently give him his med. He doesn't listen, baby, [name] is 11 months. Discussed ways that girlfriend lacks in parenting, diagnosed with paranoid schizophrenia and she is not taking care of her mental health. Patient had a discussion with her a couple of months ago about taking her meds, and fees that she tried to tell her and it is up to her to take care of herself. Discussed an approach to situation that can help with coping that includes patient coming from a helpful approach with girlfriend, giving her insight that her role as mom is a priority and taking care of herself plays a primary part in being a good parents. 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 Patient shared didn't eat dinner last night due to stress and not hungry. Shares some days are better than others. Will stay in her room all day and night, on weekends, explored this and patient said some of this is "me time" and also relates that could get angry really  quick. She is finding ways to cope with the situation. Discussed other stressors, EKG showed inverted T's, severe crying because of chest pain goes to back, thinks stress is a factor, moved into a new place,  did a lot of cleaning, advised by doctor that next time she has chest pain to go to hospital. Patient shares that she thinks what it is that she wants to know ok. Has a mass on adrenal gland, heart issues, IBS, migraines, all this is going on, everything new and all at once. Will be meeting with doctor this Wednesday for results from heart test.   Therapist to assess patient current functioning per report and processed patient's feelings related to many different stressors.  Discussed coping with situation at home and engaged in problem-solving.  Discussed coming from an approach of being a helper may help with issues of parenting as well as her boyfriend's girlfriend taking better care of herself.  Processed patient's feelings related to health issues, encouraged patient not to catastrophize, reinforced doctor's input to engage in self-care both for anxiety as well as health issues.  Provided strength based and supportive intervention  Plan: Return again in 2 weeks.2.Therapist work with patient on stress management, decrease in anxiety and depression, coping  Diagnosis: Axis I:  major depressive disorder, recurrent, moderate, generalized anxiety disorder PTSD, complicated grief, pain syndrome     Axis II: No diagnosis    Cordella Register, LCSW 03/04/2018

## 2018-03-06 ENCOUNTER — Ambulatory Visit (INDEPENDENT_AMBULATORY_CARE_PROVIDER_SITE_OTHER): Payer: Medicare Other | Admitting: Cardiology

## 2018-03-06 ENCOUNTER — Encounter: Payer: Self-pay | Admitting: Cardiology

## 2018-03-06 ENCOUNTER — Other Ambulatory Visit: Payer: Self-pay | Admitting: Family Medicine

## 2018-03-06 VITALS — BP 124/70 | HR 86 | Ht 67.0 in | Wt 260.8 lb

## 2018-03-06 DIAGNOSIS — R072 Precordial pain: Secondary | ICD-10-CM | POA: Diagnosis not present

## 2018-03-06 DIAGNOSIS — E785 Hyperlipidemia, unspecified: Secondary | ICD-10-CM | POA: Diagnosis not present

## 2018-03-06 DIAGNOSIS — F17200 Nicotine dependence, unspecified, uncomplicated: Secondary | ICD-10-CM

## 2018-03-06 DIAGNOSIS — F411 Generalized anxiety disorder: Secondary | ICD-10-CM

## 2018-03-06 MED ORDER — METOPROLOL TARTRATE 25 MG PO TABS: 12.5000 mg | ORAL_TABLET | Freq: Two times a day (BID) | ORAL | 3 refills | Status: DC

## 2018-03-06 MED ORDER — ATORVASTATIN CALCIUM 20 MG PO TABS: 20.0000 mg | ORAL_TABLET | Freq: Every day | ORAL | 3 refills | Status: DC

## 2018-03-06 NOTE — Patient Instructions (Addendum)
Medication Instructions:  Your physician has recommended you make the following change in your medication:   START: Metoprolol tartrate 12.5 mg twice daily.   START: Lipitor 20 mg daily  If you need a refill on your cardiac medications before your next appointment, please call your pharmacy.   Lab work: Your physician recommends that you return for lab work today: Troponin I  If you have labs (blood work) drawn today and your tests are completely normal, you will receive your results only by: Marland Kitchen MyChart Message (if you have MyChart) OR . A paper copy in the mail If you have any lab test that is abnormal or we need to change your treatment, we will call you to review the results.  Testing/Procedures: Your physician has requested that you have a lexiscan myoview. For further information please visit [email or web address]. Please follow instruction sheet, as given.    Follow-Up: At [facility], you and your health needs are our priority.  As part of our continuing mission to provide you with exceptional heart care, we have created designated Provider Care Teams.  These Care Teams include your primary Cardiologist (physician) and Advanced Practice Providers (APPs -  Physician Assistants and Nurse Practitioners) who all work together to provide you with the care you need, when you need it. You will need a follow up appointment in 3 weeks.  Please call our office 2 months in advance to schedule this appointment.  You may see No primary care provider on file. or another member of our [employer] Provider Team in [place]: Shirlee More, MD . Jyl Heinz, MD  Any Other Special Instructions Will Be Listed Below (If Applicable).  Cardiac Nuclear Scan A cardiac nuclear scan is a test that measures blood flow to the heart when a person is resting and when he or she is exercising. The test looks for problems such as:  Not enough blood reaching a portion of the heart.  The heart muscle not  working normally.  You may need this test if:  You have heart disease.  You have had abnormal lab results.  You have had heart surgery or angioplasty.  You have chest pain.  You have shortness of breath.  In this test, a radioactive dye (tracer) is injected into your bloodstream. After the tracer has traveled to your heart, an imaging device is used to measure how much of the tracer is absorbed by or distributed to various areas of your heart. This procedure is usually done at a hospital and takes 2-4 hours. Tell a health care provider about:  Any allergies you have.  All medicines you are taking, including vitamins, herbs, eye drops, creams, and over-the-counter medicines.  Any problems you or family members have had with the use of anesthetic medicines.  Any blood disorders you have.  Any surgeries you have had.  Any medical conditions you have.  Whether you are pregnant or may be pregnant. What are the risks? Generally, this is a safe procedure. However, problems may occur, including:  Serious chest pain and heart attack. This is only a risk if the stress portion of the test is done.  Rapid heartbeat.  Sensation of warmth in your chest. This usually passes quickly.  What happens before the procedure?  Ask your health care provider about changing or stopping your regular medicines. This is especially important if you are taking diabetes medicines or blood thinners.  Remove your jewelry on the day of the procedure. What happens during the procedure?  An IV tube will be inserted into one of your veins.  Your health care provider will inject a small amount of radioactive tracer through the tube.  You will wait for 20-40 minutes while the tracer travels through your bloodstream.  Your heart activity will be monitored with an electrocardiogram (ECG).  You will lie down on an exam table.  Images of your heart will be taken for about 15-20 minutes.  You may be  asked to exercise on a treadmill or stationary bike. While you exercise, your heart's activity will be monitored with an ECG, and your blood pressure will be checked. If you are unable to exercise, you may be given a medicine to increase blood flow to parts of your heart.  When blood flow to your heart has peaked, a tracer will again be injected through the IV tube.  After 20-40 minutes, you will get back on the exam table and have more images taken of your heart.  When the procedure is over, your IV tube will be removed. The procedure may vary among health care providers and hospitals. Depending on the type of tracer used, scans may need to be repeated 3-4 hours later. What happens after the procedure?  Unless your health care provider tells you otherwise, you may return to your normal schedule, including diet, activities, and medicines.  Unless your health care provider tells you otherwise, you may increase your fluid intake. This will help flush the contrast dye from your body. Drink enough fluid to keep your urine clear or pale yellow.  It is up to you to get your test results. Ask your health care provider, or the department that is doing the test, when your results will be ready. Summary  A cardiac nuclear scan measures the blood flow to the heart when a person is resting and when he or she is exercising.  You may need this test if you are at risk for heart disease.  Tell your health care provider if you are pregnant.  Unless your health care provider tells you otherwise, increase your fluid intake. This will help flush the contrast dye from your body. Drink enough fluid to keep your urine clear or pale yellow. This information is not intended to replace advice given to you by your health care provider. Make sure you discuss any questions you have with your health care provider. Document Released: 05/26/2004 Document Revised: 05/03/2016 Document Reviewed: 04/09/2013 Elsevier  Interactive Patient Education  2017 [employer].  Atorvastatin tablets What is this medicine? ATORVASTATIN (a TORE va sta tin) is known as a HMG-CoA reductase inhibitor or 'statin'. It lowers the level of cholesterol and triglycerides in the blood. This drug may also reduce the risk of heart attack, stroke, or other health problems in patients with risk factors for heart disease. Diet and lifestyle changes are often used with this drug. This medicine may be used for other purposes; ask your health care provider or pharmacist if you have questions. COMMON BRAND NAME(S): Lipitor What should I tell my health care provider before I take this medicine? They need to know if you have any of these conditions: -frequently drink alcoholic beverages -history of stroke, TIA -kidney disease -liver disease -muscle aches or weakness -other medical condition -an unusual or allergic reaction to atorvastatin, other medicines, foods, dyes, or preservatives -pregnant or trying to get pregnant -breast-feeding How should I use this medicine? Take this medicine by mouth with a glass of water. Follow the directions on the prescription  label. You can take this medicine with or without food. Take your doses at regular intervals. Do not take your medicine more often than directed. Talk to your pediatrician regarding the use of this medicine in children. While this drug may be prescribed for children as young as 89 years old for selected conditions, precautions do apply. Overdosage: If you think you have taken too much of this medicine contact a poison control center or emergency room at once. NOTE: This medicine is only for you. Do not share this medicine with others. What if I miss a dose? If you miss a dose, take it as soon as you can. If it is almost time for your next dose, take only that dose. Do not take double or extra doses. What may interact with this medicine? Do not take this medicine with any of the  following medications: -red yeast rice -telaprevir -telithromycin -voriconazole This medicine may also interact with the following medications: -alcohol -antiviral medicines for HIV or AIDS -boceprevir -certain antibiotics like clarithromycin, erythromycin, troleandomycin -certain medicines for cholesterol like fenofibrate or gemfibrozil -cimetidine -clarithromycin -colchicine -cyclosporine -digoxin -female hormones, like estrogens or progestins and birth control pills -grapefruit juice -medicines for fungal infections like fluconazole, itraconazole, ketoconazole -niacin -rifampin -spironolactone This list may not describe all possible interactions. Give your health care provider a list of all the medicines, herbs, non-prescription drugs, or dietary supplements you use. Also tell them if you smoke, drink alcohol, or use illegal drugs. Some items may interact with your medicine. What should I watch for while using this medicine? Visit your doctor or health care professional for regular check-ups. You may need regular tests to make sure your liver is working properly. Tell your doctor or health care professional right away if you get any unexplained muscle pain, tenderness, or weakness, especially if you also have a fever and tiredness. Your doctor or health care professional may tell you to stop taking this medicine if you develop muscle problems. If your muscle problems do not go away after stopping this medicine, contact your health care professional. This drug is only part of a total heart-health program. Your doctor or a dietician can suggest a low-cholesterol and low-fat diet to help. Avoid alcohol and smoking, and keep a proper exercise schedule. Do not use this drug if you are pregnant or breast-feeding. Serious side effects to an unborn child or to an infant are possible. Talk to your doctor or pharmacist for more information. This medicine may affect blood sugar levels. If you  have diabetes, check with your doctor or health care professional before you change your diet or the dose of your diabetic medicine. If you are going to have surgery tell your health care professional that you are taking this drug. What side effects may I notice from receiving this medicine? Side effects that you should report to your doctor or health care professional as soon as possible: -allergic reactions like skin rash, itching or hives, swelling of the face, lips, or tongue -dark urine -fever -joint pain -muscle cramps, pain -redness, blistering, peeling or loosening of the skin, including inside the mouth -trouble passing urine or change in the amount of urine -unusually weak or tired -yellowing of eyes or skin Side effects that usually do not require medical attention (report to your doctor or health care professional if they continue or are bothersome): -constipation -heartburn -stomach gas, pain, upset This list may not describe all possible side effects. Call your doctor for medical advice about side  effects. You may report side effects to FDA at 1-800-FDA-1088. Where should I keep my medicine? Keep out of the reach of children. Store at room temperature between 20 to 25 degrees C (68 to 77 degrees F). Throw away any unused medicine after the expiration date. NOTE: This sheet is a summary. It may not cover all possible information. If you have questions about this medicine, talk to your doctor, pharmacist, or health care provider.  2018 Elsevier/Gold Standard (2011-03-21 62:69:48)  Metoprolol tablets What is this medicine? METOPROLOL (me TOE proe lole) is a beta-blocker. Beta-blockers reduce the workload on the heart and help it to beat more regularly. This medicine is used to treat high blood pressure and to prevent chest pain. It is also used to after a heart attack and to prevent an additional heart attack from occurring. This medicine may be used for other purposes; ask  your health care provider or pharmacist if you have questions. COMMON BRAND NAME(S): Lopressor What should I tell my health care provider before I take this medicine? They need to know if you have any of these conditions: -diabetes -heart or vessel disease like slow heart rate, worsening heart failure, heart block, sick sinus syndrome or Raynaud's disease -kidney disease -liver disease -lung or breathing disease, like asthma or emphysema -pheochromocytoma -thyroid disease -an unusual or allergic reaction to metoprolol, other beta-blockers, medicines, foods, dyes, or preservatives -pregnant or trying to get pregnant -breast-feeding How should I use this medicine? Take this medicine by mouth with a drink of water. Follow the directions on the prescription label. Take this medicine immediately after meals. Take your doses at regular intervals. Do not take more medicine than directed. Do not stop taking this medicine suddenly. This could lead to serious heart-related effects. Talk to your pediatrician regarding the use of this medicine in children. Special care may be needed. Overdosage: If you think you have taken too much of this medicine contact a poison control center or emergency room at once. NOTE: This medicine is only for you. Do not share this medicine with others. What if I miss a dose? If you miss a dose, take it as soon as you can. If it is almost time for your next dose, take only that dose. Do not take double or extra doses. What may interact with this medicine? This medicine may interact with the following medications: -certain medicines for blood pressure, heart disease, irregular heart beat -certain medicines for depression like monoamine oxidase (MAO) inhibitors, fluoxetine, or paroxetine -clonidine -dobutamine -epinephrine -isoproterenol -reserpine This list may not describe all possible interactions. Give your health care provider a list of all the medicines, herbs,  non-prescription drugs, or dietary supplements you use. Also tell them if you smoke, drink alcohol, or use illegal drugs. Some items may interact with your medicine. What should I watch for while using this medicine? Visit your doctor or health care professional for regular check ups. Contact your doctor right away if your symptoms worsen. Check your blood pressure and pulse rate regularly. Ask your health care professional what your blood pressure and pulse rate should be, and when you should contact them. You may get drowsy or dizzy. Do not drive, use machinery, or do anything that needs mental alertness until you know how this medicine affects you. Do not sit or stand up quickly, especially if you are an older patient. This reduces the risk of dizzy or fainting spells. Contact your doctor if these symptoms continue. Alcohol may interfere with the  effect of this medicine. Avoid alcoholic drinks. What side effects may I notice from receiving this medicine? Side effects that you should report to your doctor or health care professional as soon as possible: -allergic reactions like skin rash, itching or hives -cold or numb hands or feet -depression -difficulty breathing -faint -fever with sore throat -irregular heartbeat, chest pain -rapid weight gain -swollen legs or ankles Side effects that usually do not require medical attention (report to your doctor or health care professional if they continue or are bothersome): -anxiety or nervousness -change in sex drive or performance -dry skin -headache -nightmares or trouble sleeping -short term memory loss -stomach upset or diarrhea -unusually tired This list may not describe all possible side effects. Call your doctor for medical advice about side effects. You may report side effects to FDA at 1-800-FDA-1088. Where should I keep my medicine? Keep out of the reach of children. Store at room temperature between 15 and 30 degrees C (59 and 86  degrees F). Throw away any unused medicine after the expiration date. NOTE: This sheet is a summary. It may not cover all possible information. If you have questions about this medicine, talk to your doctor, pharmacist, or health care provider.  2018 Elsevier/Gold Standard (2013-01-03 14:40:36)

## 2018-03-06 NOTE — Progress Notes (Signed)
Cardiology Consultation:    Date:  03/06/2018   ID:  ZOIEY CHRISTY, DOB Dec 16, 1970, MRN [id number]  PCP:  Hali Marry, MD  Cardiologist:  Jenne Campus, MD   Referring MD: Hali Marry, *   Chief Complaint  Patient presents with  . Chest Pain  Chest pain  History of Present Illness:    Ashley Gomez is a 47 y.o. female who is being seen today for the evaluation of chest pain at the request of Hali Marry, *.  For about the last 2 weeks she experienced chest pain she described a sensation of heavy in the middle of the chest.  So far this sensation always happened when she was sitting or laying down last time she had it was about a week ago she ate about 1-1/2-hour before going to bed potentially down in the bed and started having the sensation she said it was very bad she was trying to take a deep breath breathing did not make it worse she did have mild sweating with this but she always sweats a lot.  She took some aspirin with it.  Eventually she ended up sitting up and walking around and finally the sensation after about 30 minutes of maybe 45 minutes get better but she describes some uneasy sensation in his chest until next day.  She went to her primary care physician EKG was done apparently she does have some T inversion in anterior precordium she was referred to [name].  Total number of chest pain she got was about 3 at the same time she can walk climb stairs with no difficulties.  She does get short of breath easily dull. She does have significant risk factor for coronary artery disease that include smoking luckily since the time she started getting those episodes pain she reduced number of cigarettes she smokes.  She does have her twin sister should who get bicuspid aortic valve which was a repair however recently she suffered from microinfarction.  Her father also got MI fairly early age.  She does have dyslipidemia with LDL of 144.  She suffers from  migraines.  Past Medical History:  Diagnosis Date  . Anxiety   . Arthritis   . CVA (cerebral infarction)    Old CVA on MRI brain  . Depression   . DVT (deep venous thrombosis) ([place]) 08/65/7846   L basilic vein   . Facet hypertrophy of lumbar region    MRI 2007  . Fibromyalgia   . GERD (gastroesophageal reflux disease)   . Hypertension    no meds now, lost 50lbs  . Migraines   . MS (multiple sclerosis) ([place])   . Neuromuscular disorder ([place])   . Obesity   . PTSD (post-traumatic stress disorder)   . Seizures ([place]) 2011   no seizure since onset  . Sleep apnea    diagnosed 20 ago, lost wt no CPAP now  . Smoking   . Stroke [facility])    doesn't know when    Past Surgical History:  Procedure Laterality Date  . CHOLECYSTECTOMY  10/2017  . CHONDROPLASTY Left 12/30/2014   Procedure: CHONDROPLASTY;  Surgeon: Leandrew Koyanagi, MD;  Location: [place];  Service: Orthopedics;  Laterality: Left;  . KNEE ARTHROSCOPY Left 12/30/2014   Procedure: LEFT KNEE ARTHROSCOPY WITH   CHONDROPLASTY;  Surgeon: Leandrew Koyanagi, MD;  Location: [place];  Service: Orthopedics;  Laterality: Left;  . PARTIAL KNEE ARTHROPLASTY Left 08/03/2016  Procedure: LEFT UNICOMPARTMENTAL KNEE ARTHROPLASTY;  Surgeon: Leandrew Koyanagi, MD;  Location: [place];  Service: Orthopedics;  Laterality: Left;  . TONSILLECTOMY    . TUBAL LIGATION  1992    Current Medications: Current Meds  Medication Sig  . acetaminophen (TYLENOL) 500 MG tablet Take 1,000 mg by mouth every 6 (six) hours as needed for mild pain or headache.  . celecoxib (CELEBREX) 200 MG capsule TAKE 2 CAPSULES EVERY DAY AS NEEDED FOR PAIN  . clonazePAM (KLONOPIN) 1 MG tablet Take 1 tablet (1 mg total) by mouth 2 (two) times daily as needed for anxiety. NO EARLY REFILLS  . dicyclomine (BENTYL) 20 MG tablet Take by mouth.  . DULoxetine (CYMBALTA) 20 MG capsule Take 1 capsule (20 mg total) by mouth daily.  . DULoxetine (CYMBALTA) 60 MG capsule  Take 1 capsule (60 mg total) by mouth daily.  Marland Kitchen gabapentin (NEURONTIN) 300 MG capsule Take 1 capsule (300 mg total) by mouth 3 (three) times daily.  Marland Kitchen omeprazole (PRILOSEC) 40 MG capsule Take 1 capsule (40 mg total) by mouth daily.  . ondansetron (ZOFRAN) 4 MG tablet take 1 to 2 tablet by mouth every 8 hours if needed for nausea and vomiting  . ranitidine (ZANTAC) 150 MG tablet Take by mouth.  . SUMAtriptan (IMITREX) 100 MG tablet Take 1 tablet (100 mg total) by mouth every 2 (two) hours as needed for migraine. No more than 2 in 24 hours.  . topiramate (TOPAMAX) 200 MG tablet Take 1 tablet (200 mg total) by mouth 2 (two) times daily.     Allergies:   Ace inhibitors; Atenolol; Celexa [citalopram hydrobromide]; Codeine; Nitroglycerin; Phenytoin; Sertraline; and Triamcinolone   Social History   Socioeconomic History  . Marital status: Single    Spouse name: Not on file  . Number of children: Not on file  . Years of education: Not on file  . Highest education level: Not on file  Occupational History  . Not on file  Social Needs  . Financial resource strain: Not on file  . Food insecurity:    Worry: Not on file    Inability: Not on file  . Transportation needs:    Medical: Not on file    Non-medical: Not on file  Tobacco Use  . Smoking status: Current Every Day Smoker    Packs/day: 1.00    Years: 25.00    Pack years: 25.00    Types: Cigarettes  . Smokeless tobacco: Never Used  . Tobacco comment: advised to d/c by 3 cigs per week.  Substance and Sexual Activity  . Alcohol use: No    Alcohol/week: 0.0 standard drinks    Comment: occasional  . Drug use: No  . Sexual activity: Yes    Birth control/protection: None, Post-menopausal  Lifestyle  . Physical activity:    Days per week: Not on file    Minutes per session: Not on file  . Stress: Not on file  Relationships  . Social connections:    Talks on phone: Not on file    Gets together: Not on file    Attends religious  service: Not on file    Active member of club or organization: Not on file    Attends meetings of clubs or organizations: Not on file    Relationship status: Not on file  Other Topics Concern  . Not on file  Social History Narrative  . Not on file     Family History: The patient's family history includes Cancer (  age of onset: 58) in her mother; Depression in her sister; Diabetes in her sister; Heart disease in her sister; Hypertension in her sister; Sjogren's syndrome in her sister. ROS:   Please see the history of present illness.    All 14 point review of systems negative except as described per history of present illness.  EKGs/Labs/Other Studies Reviewed:    The following studies were reviewed today:   EKG:  EKG is  ordered today.  The ekg ordered today demonstrates normal sinus rhythm normal P interval there is T inversion V1 to V3  Recent Labs: 02/15/2018: ALT 6; BUN 15; Creat 0.85; Hemoglobin 15.2; Platelets 254; Potassium 4.0; Sodium 139; TSH 0.53  Recent Lipid Panel    Component Value Date/Time   CHOL 218 (H) 11/29/2016 1213   TRIG 151 (H) 11/29/2016 1213   HDL 44 (L) 11/29/2016 1213   CHOLHDL 5.0 (H) 11/29/2016 1213   VLDL 30 11/29/2016 1213   LDLCALC 144 (H) 11/29/2016 1213    Physical Exam:    VS:  BP 124/70   Pulse 86   Ht 5\' 7"  (1.702 m)   Wt 260 lb 12.8 oz (118.3 kg)   LMP 12/28/2014   SpO2 96%   BMI 40.85 kg/m     Wt Readings from Last 3 Encounters:  03/06/18 260 lb 12.8 oz (118.3 kg)  02/15/18 258 lb (117 kg)  01/23/18 269 lb (122 kg)     GEN:  Well nourished, well developed in no acute distress HEENT: Normal NECK: No JVD; No carotid bruits LYMPHATICS: No lymphadenopathy CARDIAC: RRR, no murmurs, no rubs, no gallops RESPIRATORY:  Clear to auscultation without rales, wheezing or rhonchi  ABDOMEN: Soft, non-tender, non-distended MUSCULOSKELETAL:  No edema; No deformity  SKIN: Warm and dry NEUROLOGIC:  Alert and oriented x 3 PSYCHIATRIC:   Normal affect   ASSESSMENT:    1. Anxiety state   2. Precordial chest pain   3. Heavy tobacco smoker   4. Dyslipidemia    PLAN:    In order of problems listed above:  1. Precordial chest pain.  Concerning EKG does have some changes.  I asked her to have troponin I do not today.  I will also ask her to start taking one baby aspirin every single day.  I will give him a Toprol 25 twice daily also will initiate statin if her troponin I is positive she will require cardiac catheterization if not I will schedule her to have [place]. 2. Smoking she was told she quit she understands it and she really cut down significantly number of cigarettes she smoked. 3. Dyslipidemia we will start her with Lipitor 20 mg.   Medication Adjustments/Labs and Tests Ordered: Current medicines are reviewed at length with the patient today.  Concerns regarding medicines are outlined above.  No orders of the defined types were placed in this encounter.  No orders of the defined types were placed in this encounter.   Signed, Park Liter, MD, [facility]. 03/06/2018 10:07 AM    [place]

## 2018-03-07 LAB — TROPONIN I: Troponin I: <0.01 ng/mL (ref 0.00–0.04)

## 2018-03-09 DIAGNOSIS — G35 Multiple sclerosis: Secondary | ICD-10-CM | POA: Diagnosis not present

## 2018-03-09 DIAGNOSIS — M129 Arthropathy, unspecified: Secondary | ICD-10-CM | POA: Diagnosis not present

## 2018-03-09 DIAGNOSIS — E278 Other specified disorders of adrenal gland: Secondary | ICD-10-CM | POA: Diagnosis not present

## 2018-03-09 DIAGNOSIS — G43009 Migraine without aura, not intractable, without status migrainosus: Secondary | ICD-10-CM | POA: Diagnosis not present

## 2018-03-09 DIAGNOSIS — R569 Unspecified convulsions: Secondary | ICD-10-CM | POA: Diagnosis not present

## 2018-03-09 DIAGNOSIS — M792 Neuralgia and neuritis, unspecified: Secondary | ICD-10-CM | POA: Diagnosis not present

## 2018-03-12 ENCOUNTER — Telehealth (HOSPITAL_COMMUNITY): Payer: Self-pay | Admitting: Radiology

## 2018-03-12 NOTE — Telephone Encounter (Signed)
Patient given detailed instructions per Myocardial Perfusion Study Information Sheet for the test on 03/20/2018 at 12:30. Patient notified to arrive 15 minutes early and that it is imperative to arrive on time for appointment to keep from having the test rescheduled.  If you need to cancel or reschedule your appointment, please call the office within 24 hours of your appointment. . Patient verbalized understanding.EHK

## 2018-03-20 ENCOUNTER — Ambulatory Visit (HOSPITAL_COMMUNITY): Payer: Medicare Other | Admitting: Licensed Clinical Social Worker

## 2018-03-20 ENCOUNTER — Ambulatory Visit (HOSPITAL_COMMUNITY): Payer: Self-pay

## 2018-03-20 ENCOUNTER — Other Ambulatory Visit: Payer: Self-pay | Admitting: Family Medicine

## 2018-03-21 ENCOUNTER — Ambulatory Visit (HOSPITAL_COMMUNITY): Payer: Self-pay

## 2018-03-23 ENCOUNTER — Other Ambulatory Visit (HOSPITAL_COMMUNITY): Payer: Self-pay | Admitting: Psychiatry

## 2018-03-25 ENCOUNTER — Ambulatory Visit (INDEPENDENT_AMBULATORY_CARE_PROVIDER_SITE_OTHER): Payer: Medicare Other | Admitting: Licensed Clinical Social Worker

## 2018-03-25 DIAGNOSIS — G43009 Migraine without aura, not intractable, without status migrainosus: Secondary | ICD-10-CM | POA: Diagnosis not present

## 2018-03-25 DIAGNOSIS — Z634 Disappearance and death of family member: Secondary | ICD-10-CM | POA: Diagnosis not present

## 2018-03-25 DIAGNOSIS — G894 Chronic pain syndrome: Secondary | ICD-10-CM | POA: Diagnosis not present

## 2018-03-25 DIAGNOSIS — F331 Major depressive disorder, recurrent, moderate: Secondary | ICD-10-CM | POA: Diagnosis not present

## 2018-03-25 DIAGNOSIS — F411 Generalized anxiety disorder: Secondary | ICD-10-CM | POA: Diagnosis not present

## 2018-03-25 DIAGNOSIS — F4310 Post-traumatic stress disorder, unspecified: Secondary | ICD-10-CM

## 2018-03-25 DIAGNOSIS — F4329 Adjustment disorder with other symptoms: Secondary | ICD-10-CM

## 2018-03-25 DIAGNOSIS — Z79899 Other long term (current) drug therapy: Secondary | ICD-10-CM | POA: Diagnosis not present

## 2018-03-25 DIAGNOSIS — M792 Neuralgia and neuritis, unspecified: Secondary | ICD-10-CM | POA: Diagnosis not present

## 2018-03-25 DIAGNOSIS — M129 Arthropathy, unspecified: Secondary | ICD-10-CM | POA: Diagnosis not present

## 2018-03-25 DIAGNOSIS — F4321 Adjustment disorder with depressed mood: Secondary | ICD-10-CM

## 2018-03-25 NOTE — Progress Notes (Signed)
   THERAPIST PROGRESS NOTE  Session Time: 10:02 AM to 10:58 AM  Participation Level: Active  Behavioral Response: CasualAlertPatient coughing in session, shows signs of being sick, she was appropriate  Type of Therapy: Individual Therapy  Treatment Goals addressed:  Patient work on coping skills for stress management, decrease in anxiety and depression, coping  Interventions: Solution Focused, Strength-based, Supportive and Other: stress management  Summary: Ashley Gomez is a 47 y.o. female who presents with no sleep from coughing, head cold, sinuses, also discussed being "stressed all the time". Broke a tooth in the back related to grinding teeth, discussed relationship grinding has to her stress. Shares not together with boyfriend "over it" and has other stressors to focus on. Ongoing stress at the house. Describes aspects of relationship with son's girlfriend is not taking care of household the way she needs to, has issues about ways she takes care of kids, for example, letting the baby scream, staying all day in her room and letting her son take care of them. Describes her attitude as "poor me", can have an attitude with patient. Patient feels she needs to have more responsibilities. Had it out with her, depending on how things are today she may say something. Discussed healthy for patient to be assertive in her relationships. Patient also having problems with house, maintenance issues and patient assertive with manager. Describes dealing with pain issues as stressful. Goes to [place], has new neurologist, manages pain, also addresses other issues, supposed to have a CT scan and still haven't had one, worst stressor for her is mass on adrenal gland, looked it up and cried two days because of concerns it could be cancer. Supposed to have myocardia infusion, stress test, cancelled twice. Shares her concern that her anxiety feels like her heart beats out of chest, worried as the test causes her  heart to speed up further, will have panic symptoms because it will make it beat faster, cancelled test twice. Shares coping by spending more time with grandchildren, takes the dogs out, smokes a cigarette. Discussed having "me time" that she is in room and watches what she wants. Discussed ways patient could add to me time to help in taking care of herself.  Therapist assessed patient current functioning per report and processed her feelings related to current stressors.  Discussed coping strategies for stressors.  Provided positive feedback for patient being assertive in her relationships.  Validated patient on her recognition that sons girlfriend needs to be more responsible for things in her life, patient can provide guidance, identified patient's frustration with lack of progress in her taking more responsibility, processed patient's feelings relating to have to do her work, discussed anger management strategies to include understanding what is behind the anger and also recognizing its a feeling in it will pass, identifying consequences if she acts out in anger as motivation for her to find more effective strategies.  Identified medical issues as stressor, courage patient with meantime, explored effective coping strategies to include spending time with grandchildren and encourage patient to stay away from cognitive distortion of catastrophize saying as unhelpful.  Provided strength based and supportive intervention  Suicidal/Homicidal: No  Plan: Return again in 2 weeks.2.  Gerald Stabs work with patient on Child psychotherapist, coping strategies for anxiety and depression  Diagnosis: Axis I:  major depressive disorder, recurrent, moderate, generalized anxiety disorder PTSD, complicated grief, pain syndrome    Axis II: No diagnosis    Cordella Register, LCSW 03/25/2018

## 2018-03-26 ENCOUNTER — Ambulatory Visit (HOSPITAL_COMMUNITY): Payer: Self-pay

## 2018-03-27 ENCOUNTER — Ambulatory Visit (HOSPITAL_COMMUNITY): Payer: Self-pay

## 2018-03-27 ENCOUNTER — Ambulatory Visit: Payer: Self-pay | Admitting: Cardiology

## 2018-04-01 ENCOUNTER — Telehealth: Payer: Self-pay

## 2018-04-01 NOTE — Telephone Encounter (Signed)
New message    Just an FYI.  We have made several attempts to contact this patient including sending a letter to schedule or reschedule their MYOCARDIAL PERFUSION. We will be removing the patient from the WQ.   Thank you 

## 2018-04-02 DIAGNOSIS — R109 Unspecified abdominal pain: Secondary | ICD-10-CM | POA: Diagnosis not present

## 2018-04-02 NOTE — Telephone Encounter (Signed)
Dr. Krasowski aware.  

## 2018-04-04 ENCOUNTER — Telehealth: Payer: Self-pay

## 2018-04-04 NOTE — Telephone Encounter (Signed)
Ashley Gomez called for her results from [place]. Please advise.

## 2018-04-09 ENCOUNTER — Ambulatory Visit: Payer: Self-pay | Admitting: Cardiology

## 2018-04-09 NOTE — Telephone Encounter (Signed)
Have not received a copy of the report.  She may need to call them and have them fax it to our office.

## 2018-04-09 NOTE — Telephone Encounter (Signed)
Pt advised, she will have them fax it over.

## 2018-04-10 ENCOUNTER — Encounter: Payer: Self-pay | Admitting: Family Medicine

## 2018-04-10 ENCOUNTER — Ambulatory Visit (INDEPENDENT_AMBULATORY_CARE_PROVIDER_SITE_OTHER): Payer: Medicare Other | Admitting: Family Medicine

## 2018-04-10 VITALS — BP 130/78 | HR 64 | Ht 64.0 in | Wt 257.0 lb

## 2018-04-10 DIAGNOSIS — J019 Acute sinusitis, unspecified: Secondary | ICD-10-CM | POA: Diagnosis not present

## 2018-04-10 MED ORDER — AMOXICILLIN-POT CLAVULANATE 875-125 MG PO TABS: 1.0000 | ORAL_TABLET | Freq: Two times a day (BID) | ORAL | 0 refills | Status: DC

## 2018-04-10 NOTE — Progress Notes (Signed)
Acute Office Visit  Subjective:    Patient ID: Ashley Gomez, female    DOB: [birth year], 47 y.o.   MRN: [id number]  Chief Complaint  Patient presents with  . Sinusitis    X 2wks facial pain pressure,headache,cough started off green in color now white, sweats, denies any fevers. taking tylenol cold/flu/sinus daytime and night time     HPI Patient is in today for sinus sxs x 2 week.  She has had mild ST as well.  Sweats and chills at night. Her son whom she lives with has pneumonia. No SOB.  No fever. She has facial pain pressure,headache,cough started off green in color now white, sweats, denies any fevers. taking tylenol cold/flu/sinus daytime and night time. No worsenign or alleviating factors.   Past Medical History:  Diagnosis Date  . Anxiety   . Arthritis   . CVA (cerebral infarction)    Old CVA on MRI brain  . Depression   . DVT (deep venous thrombosis) ([place]) 32/35/5732   L basilic vein   . Facet hypertrophy of lumbar region    MRI 2007  . Fibromyalgia   . GERD (gastroesophageal reflux disease)   . Hypertension    no meds now, lost 50lbs  . Migraines   . MS (multiple sclerosis) ([place])   . Neuromuscular disorder ([place])   . Obesity   . PTSD (post-traumatic stress disorder)   . Seizures ([place]) 2011   no seizure since onset  . Sleep apnea    diagnosed 20 ago, lost wt no CPAP now  . Smoking   . Stroke [facility])    doesn't know when    Past Surgical History:  Procedure Laterality Date  . CHOLECYSTECTOMY  10/2017  . CHONDROPLASTY Left 12/30/2014   Procedure: CHONDROPLASTY;  Surgeon: Leandrew Koyanagi, MD;  Location: [place];  Service: Orthopedics;  Laterality: Left;  . KNEE ARTHROSCOPY Left 12/30/2014   Procedure: LEFT KNEE ARTHROSCOPY WITH   CHONDROPLASTY;  Surgeon: Leandrew Koyanagi, MD;  Location: [place];  Service: Orthopedics;  Laterality: Left;  . PARTIAL KNEE ARTHROPLASTY Left 08/03/2016   Procedure: LEFT UNICOMPARTMENTAL KNEE ARTHROPLASTY;   Surgeon: Leandrew Koyanagi, MD;  Location: [place];  Service: Orthopedics;  Laterality: Left;  . TONSILLECTOMY    . TUBAL LIGATION  1992    Family History  Problem Relation Age of Onset  . Cancer Mother 106       melanoma  . Hypertension Sister   . Heart disease Sister        valve disease  . Depression Sister   . Diabetes Sister   . Sjogren's syndrome Sister     Social History   Socioeconomic History  . Marital status: Single    Spouse name: Not on file  . Number of children: Not on file  . Years of education: Not on file  . Highest education level: Not on file  Occupational History  . Not on file  Social Needs  . Financial resource strain: Not on file  . Food insecurity:    Worry: Not on file    Inability: Not on file  . Transportation needs:    Medical: Not on file    Non-medical: Not on file  Tobacco Use  . Smoking status: Current Every Day Smoker    Packs/day: 1.00    Years: 25.00    Pack years: 25.00    Types: Cigarettes  . Smokeless tobacco: Never Used  . Tobacco comment:  advised to d/c by 3 cigs per week.  Substance and Sexual Activity  . Alcohol use: No    Alcohol/week: 0.0 standard drinks    Comment: occasional  . Drug use: No  . Sexual activity: Yes    Birth control/protection: None, Post-menopausal  Lifestyle  . Physical activity:    Days per week: Not on file    Minutes per session: Not on file  . Stress: Not on file  Relationships  . Social connections:    Talks on phone: Not on file    Gets together: Not on file    Attends religious service: Not on file    Active member of club or organization: Not on file    Attends meetings of clubs or organizations: Not on file    Relationship status: Not on file  . Intimate partner violence:    Fear of current or ex partner: Not on file    Emotionally abused: Not on file    Physically abused: Not on file    Forced sexual activity: Not on file  Other Topics Concern  . Not on file  Social History  Narrative  . Not on file    Outpatient Medications Prior to Visit  Medication Sig Dispense Refill  . acetaminophen (TYLENOL) 500 MG tablet Take 1,000 mg by mouth every 6 (six) hours as needed for mild pain or headache.    Marland Kitchen atorvastatin (LIPITOR) 20 MG tablet Take 1 tablet (20 mg total) by mouth daily. 90 tablet 3  . celecoxib (CELEBREX) 200 MG capsule TAKE 2 CAPSULES EVERY DAY AS NEEDED FOR PAIN 60 capsule 2  . clonazePAM (KLONOPIN) 1 MG tablet Take 1 tablet (1 mg total) by mouth 2 (two) times daily as needed for anxiety. NO EARLY REFILLS 60 tablet 3  . clonazePAM (KLONOPIN) 1 MG tablet Take 1 mg by mouth 2 (two) times daily as needed for anxiety.  3  . dicyclomine (BENTYL) 20 MG tablet Take by mouth.    . DULoxetine (CYMBALTA) 20 MG capsule Take 1 capsule (20 mg total) by mouth daily. 30 capsule 1  . DULoxetine (CYMBALTA) 60 MG capsule Take 1 capsule (60 mg total) by mouth daily. 30 capsule 3  . gabapentin (NEURONTIN) 300 MG capsule Take 1 capsule (300 mg total) by mouth 3 (three) times daily. 270 capsule 1  . HYDROcodone-acetaminophen (NORCO) 10-325 MG tablet Take 1 tablet by mouth every 6 (six) hours as needed for pain. for pain  0  . metoprolol tartrate (LOPRESSOR) 25 MG tablet Take 0.5 tablets (12.5 mg total) by mouth 2 (two) times daily. 180 tablet 3  . omeprazole (PRILOSEC) 40 MG capsule Take 1 capsule (40 mg total) by mouth daily. 90 capsule 3  . ondansetron (ZOFRAN) 4 MG tablet take 1 to 2 tablet by mouth every 8 hours if needed for nausea and vomiting 30 tablet 1  . ranitidine (ZANTAC) 150 MG tablet TAKE 1 TABLET BY MOUTH TWICE A DAY 60 tablet 1  . SUMAtriptan (IMITREX) 100 MG tablet Take 1 tablet (100 mg total) by mouth every 2 (two) hours as needed for migraine. No more than 2 in 24 hours. 10 tablet 6  . topiramate (TOPAMAX) 200 MG tablet Take 1 tablet (200 mg total) by mouth 2 (two) times daily. 60 tablet 3   No facility-administered medications prior to visit.     Allergies   Allergen Reactions  . Ace Inhibitors     REACTION: cough  . Atenolol Other (See Comments)  Bradycardia  . Celexa [Citalopram Hydrobromide] Other (See Comments)    Dizziness Tolerated Lexapro   . Codeine Nausea Only  . Nitroglycerin Other (See Comments)    Drop in BP  . Phenytoin Swelling  . Sertraline Other (See Comments)    unknown  . Triamcinolone     Steroid flare after joint injection, use other steroid for injections    ROS     Objective:    Physical Exam  BP 130/78   Pulse 64   Ht 5\' 4"  (1.626 m)   Wt 257 lb (116.6 kg)   LMP 12/28/2014   SpO2 98%   BMI 44.11 kg/m  Wt Readings from Last 3 Encounters:  04/10/18 257 lb (116.6 kg)  03/06/18 260 lb 12.8 oz (118.3 kg)  02/15/18 258 lb (117 kg)    There are no preventive care reminders to display for this patient.  There are no preventive care reminders to display for this patient.   Lab Results  Component Value Date   TSH 0.53 02/15/2018   Lab Results  Component Value Date   WBC 6.4 02/15/2018   HGB 15.2 02/15/2018   HCT 46.6 (H) 02/15/2018   MCV 85.7 02/15/2018   PLT 254 02/15/2018   Lab Results  Component Value Date   NA 139 02/15/2018   K 4.0 02/15/2018   CO2 29 02/15/2018   GLUCOSE 93 02/15/2018   BUN 15 02/15/2018   CREATININE 0.85 02/15/2018   BILITOT 0.4 02/15/2018   ALKPHOS 98 11/29/2016   AST 12 02/15/2018   ALT 6 02/15/2018   PROT 6.6 02/15/2018   ALBUMIN 3.6 11/29/2016   CALCIUM 9.3 02/15/2018   ANIONGAP 11 08/04/2016   Lab Results  Component Value Date   CHOL 218 (H) 11/29/2016   Lab Results  Component Value Date   HDL 44 (L) 11/29/2016   Lab Results  Component Value Date   LDLCALC 144 (H) 11/29/2016   Lab Results  Component Value Date   TRIG 151 (H) 11/29/2016   Lab Results  Component Value Date   CHOLHDL 5.0 (H) 11/29/2016   Lab Results  Component Value Date   HGBA1C 5.6 07/31/2016       Assessment & Plan:   Problem List Items Addressed This Visit     None    Visit Diagnoses    Acute non-recurrent sinusitis, unspecified location    -  Primary   Relevant Medications   amoxicillin-clavulanate (AUGMENTIN) 875-125 MG tablet     Call if not better in one week. Discussed sinus rinse but doesn't feel she can stick something in her nose.  OK to continue OTC medications.     Meds ordered this encounter  Medications  . amoxicillin-clavulanate (AUGMENTIN) 875-125 MG tablet    Sig: Take 1 tablet by mouth 2 (two) times daily.    Dispense:  14 tablet    Refill:  0     Beatrice Lecher, MD

## 2018-04-15 ENCOUNTER — Ambulatory Visit (INDEPENDENT_AMBULATORY_CARE_PROVIDER_SITE_OTHER): Payer: Medicare Other | Admitting: Licensed Clinical Social Worker

## 2018-04-15 DIAGNOSIS — F331 Major depressive disorder, recurrent, moderate: Secondary | ICD-10-CM | POA: Diagnosis not present

## 2018-04-15 DIAGNOSIS — Z634 Disappearance and death of family member: Secondary | ICD-10-CM | POA: Diagnosis not present

## 2018-04-15 DIAGNOSIS — F411 Generalized anxiety disorder: Secondary | ICD-10-CM

## 2018-04-15 DIAGNOSIS — F4321 Adjustment disorder with depressed mood: Secondary | ICD-10-CM

## 2018-04-15 DIAGNOSIS — F4329 Adjustment disorder with other symptoms: Secondary | ICD-10-CM | POA: Diagnosis not present

## 2018-04-15 DIAGNOSIS — F4310 Post-traumatic stress disorder, unspecified: Secondary | ICD-10-CM

## 2018-04-15 NOTE — Progress Notes (Signed)
THERAPIST PROGRESS NOTE  Session Time: 1:02 PM to 2:00 PM  Participation Level: Active  Behavioral Response: CasualAlertEuthymic  Type of Therapy: Individual Therapy  Treatment Goals addressed:  Patient work on coping skills for stress management, decrease in anxiety and depression, coping  Interventions: Solution Focused, Strength-based, Supportive and Other: stress management, processing of grief  Summary: Ashley Gomez is a 47 y.o. female who presents with over the weekend death of friend who she grew up with, her ex's mom passed, grand kids were closed.  Discussed how this impacted her and she related worried about her grandkids since they were close with her so checked in with them to make sure.  Relates at their young age that probably are not processing it the way they would when older, and oldest grandchild said she was ok.  Discussed improvement in relationship with her daughter Ashley Gomez, understand her reactions relate to wanting to be the strong one, is the one that likes to be in charge, she is insensitive though her feelings, when she tears up about her daughter Ashley Gomez who has died in a car accident, her daughter will say something smart and negative.  She both wants to be supportive and is also standoffish.  Noticed positive change in patient a lot more involved in grandchildren's life, they come to see her on weekends, in the morning to take the bus.  Discussed the stress of watching kids who conflict with Ashley Gomez, son's girlfriend son, he is louder and wants attention, hyperactive. There is the ongoing stress related to son's girlfriend, Ashley Gomez, difficult for patient, frustrates her with all that she is been through, to see her not give enough attention to kids that is needed is apparent, does do a lot of household chores.  Shares spends more time in her room to be out of the picture.  Realizes that if this is the way her son wants to live in who he chooses for his relationship then  she will have to let him.  Shares Ashley Gomez is always on her mind, more on her mind when she has the kids over, 1 of her grandchildren, Ashley Gomez looks exactly like her.  Discussed processing grief to channeling her left for Ashley Gomez to grandchildren.  Discussed main stressor for her is mass on adrenal gland, has gotten bigger, concerns that it could be cancerous.  Her pain management clinic has told her that has to do with her MS, patient wants to know why there, what it is and how it is affecting her.  Has been working with her primary care doctor, Dr. Madilyn Fireman, for past 10 years and wants to put management of her care in her hands feels she will be attentive to addressing medical issues.  Patient also shares has a kidney stone and never had that before.  Therapist to assess patient current functioning per report.  Processed through feelings related to recent loss, processed feelings related to stressors and discussed coping with stressors.  Identified patient taking a more hands-off approach with son and his girlfriend as helpful, recognizing he is in the relationship and his choice who he wants to be with but also recognize patient does get frustrated by girlfriends slacking and her responsibilities at the house and is apparent, recognizing source of frustration related to how patient parented, being more hands on and available.  Validated patient on how she was feeling.  Processed feelings related to grief issues and discussed channeling love for her daughter she lost to love her grandchildren.  Pointed out positive development for patient has been her assertiveness, not avoiding and how it is effective strategy in her interpersonal relationships, helps to point out things they need to be aware of as well as having a voice for her needs.  Processed through stressors related to medical issues, validated patient how she was feeling also discussed having a doctor who she is known for 10-years has managed her care,  will take direction with medical issues as a reassurance, and a positive impact on coping.  Provided strength based and supportive interventions  Suicidal/Homicidal: No  Plan: Return again in 2 weeks.2.  Please work with patient on stress management, processing through grief, coping  Diagnosis: Axis I:  major depressive disorder, recurrent, moderate, generalized anxiety disorder PTSD, complicated grief, pain syndrome    Axis II: No diagnosis    Ashley Register, LCSW 04/15/2018

## 2018-04-17 ENCOUNTER — Other Ambulatory Visit (HOSPITAL_COMMUNITY): Payer: Self-pay | Admitting: Psychiatry

## 2018-04-18 DIAGNOSIS — G894 Chronic pain syndrome: Secondary | ICD-10-CM | POA: Diagnosis not present

## 2018-04-18 DIAGNOSIS — G8929 Other chronic pain: Secondary | ICD-10-CM | POA: Diagnosis not present

## 2018-04-18 DIAGNOSIS — G43009 Migraine without aura, not intractable, without status migrainosus: Secondary | ICD-10-CM | POA: Diagnosis not present

## 2018-04-18 DIAGNOSIS — Z79899 Other long term (current) drug therapy: Secondary | ICD-10-CM | POA: Diagnosis not present

## 2018-04-18 DIAGNOSIS — M545 Low back pain: Secondary | ICD-10-CM | POA: Diagnosis not present

## 2018-04-24 ENCOUNTER — Ambulatory Visit (INDEPENDENT_AMBULATORY_CARE_PROVIDER_SITE_OTHER): Payer: Medicare Other | Admitting: Family Medicine

## 2018-04-24 ENCOUNTER — Encounter: Payer: Self-pay | Admitting: Family Medicine

## 2018-04-24 VITALS — BP 134/73 | HR 68 | Ht 67.0 in | Wt 260.0 lb

## 2018-04-24 DIAGNOSIS — R51 Headache: Secondary | ICD-10-CM

## 2018-04-24 DIAGNOSIS — L309 Dermatitis, unspecified: Secondary | ICD-10-CM | POA: Diagnosis not present

## 2018-04-24 DIAGNOSIS — R519 Headache, unspecified: Secondary | ICD-10-CM

## 2018-04-24 DIAGNOSIS — J329 Chronic sinusitis, unspecified: Secondary | ICD-10-CM

## 2018-04-24 DIAGNOSIS — D3502 Benign neoplasm of left adrenal gland: Secondary | ICD-10-CM | POA: Diagnosis not present

## 2018-04-24 MED ORDER — AMOXICILLIN-POT CLAVULANATE 875-125 MG PO TABS: 1.0000 | ORAL_TABLET | Freq: Two times a day (BID) | ORAL | 0 refills | Status: DC

## 2018-04-24 MED ORDER — TRIAMCINOLONE 0.1 % CREAM:EUCERIN CREAM 1:1: 1.0000 "application " | TOPICAL_CREAM | Freq: Every day | CUTANEOUS | 99 refills | Status: DC

## 2018-04-24 MED ORDER — KETOROLAC TROMETHAMINE 60 MG/2ML IM SOLN
60.0000 mg | Freq: Once | INTRAMUSCULAR | Status: AC
  Administered 2018-04-24: 60 mg via INTRAMUSCULAR

## 2018-04-24 NOTE — Progress Notes (Signed)
Subjective:    CC: cough and congestion.   HPI:  Her chest is feeling better but still having some cough and congestion. She was diagnosed with sinusitis 11/27th, 2 weeks ago.  She completed her Augementin She still hasn't gone for her stress test.  That she did feel much better when she was taking the Augmentin but as soon as she stopped it she started to feel worse again.  She is mostly had cough, headache and chest congestion and nasal congestion more so at night.  She also recently had a CT of the abdomen done at [facility] on November 18.  It showed a left renal calculus as well as a left adrenal adenoma.  She is concerned about the adrenal adenoma because it seems to be getting larger.  Was found incidentally and originally was measuring 23 mm.  She then had a believe an MRI which measured it at 27 mm at this time its measuring 28 mm.   She is also been scratching on her forearms.  She says year-round she gets a lot of dry skin and itching.  She is been using a triple moisturizing body wash as well as a keeps lotion which she says she applies regularly.  But she is scratching so much that she is actually getting bruising underneath the skin.  It is worse on her forearms.    Past medical history, Surgical history, Family history not pertinant except as noted below, Social history, Allergies, and medications have been entered into the medical record, reviewed, and corrections made.   Review of Systems: No fevers, chills, night sweats, weight loss, chest pain, or shortness of breath.   Objective:    General: Well Developed, well nourished, and in no acute distress.  Neuro: Alert and oriented x3, extra-ocular muscles intact, sensation grossly intact.  HEENT: Normocephalic, atraumatic, pharynx is clear, TMs and canals are clear bilaterally. Skin: Warm and dry, no rashes. Cardiac: Regular rate and rhythm, no murmurs rubs or gallops, no lower extremity edema.  Respiratory: Clear  to auscultation bilaterally. Not using accessory muscles, speaking in full sentences.   Impression and Recommendations:    Persistent daily headache-likely secondary to persistent sinus infection but she would like acute pain relief so we will give Toradol injection today.  We will go ahead and send over a prescription for Augmentin but will extend the course.  She did have a good response to just did not completely clear up the infection.  If still not improving then consider alternative treatment.  Chronic sinusitis-she did feel better on the Augmentin her symptoms just returned after she completed the course of antibiotics.  We will go ahead and send over new prescription and extend for 14 days.  If she is not better at that point please let us know.  Left adrenal adenoma-we will need to call radiology department at [facility] to see if they recommend any further work-up based on change in size of the lesion.  Eczema - We will treat with combination Eucerin/triamcinolone cream mixed one-to-one.  Continue with moisturizing frequently.

## 2018-04-30 ENCOUNTER — Telehealth: Payer: Self-pay

## 2018-04-30 NOTE — Telephone Encounter (Signed)
This, okay to use triamcinolone cream.  Recommend icing frequently as well.

## 2018-04-30 NOTE — Telephone Encounter (Signed)
Ashley Gomez called and states she has had a steroid flare after joint injection triamcinolone. Is it ok for her to use the triamcinolone cream.

## 2018-05-01 ENCOUNTER — Encounter (HOSPITAL_COMMUNITY): Payer: Self-pay | Admitting: Psychiatry

## 2018-05-01 ENCOUNTER — Ambulatory Visit (INDEPENDENT_AMBULATORY_CARE_PROVIDER_SITE_OTHER): Payer: Medicare Other | Admitting: Psychiatry

## 2018-05-01 ENCOUNTER — Other Ambulatory Visit: Payer: Self-pay

## 2018-05-01 VITALS — BP 112/80 | HR 66 | Ht 67.0 in | Wt 255.0 lb

## 2018-05-01 DIAGNOSIS — G894 Chronic pain syndrome: Secondary | ICD-10-CM

## 2018-05-01 DIAGNOSIS — F331 Major depressive disorder, recurrent, moderate: Secondary | ICD-10-CM

## 2018-05-01 DIAGNOSIS — F411 Generalized anxiety disorder: Secondary | ICD-10-CM | POA: Diagnosis not present

## 2018-05-01 DIAGNOSIS — F4310 Post-traumatic stress disorder, unspecified: Secondary | ICD-10-CM

## 2018-05-01 MED ORDER — DULOXETINE HCL 60 MG PO CPEP: 60.0000 mg | ORAL_CAPSULE | Freq: Two times a day (BID) | ORAL | 1 refills | Status: DC
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
## 2018-05-01 NOTE — Telephone Encounter (Signed)
Patient advised of recommendations.  

## 2018-05-02 ENCOUNTER — Ambulatory Visit (INDEPENDENT_AMBULATORY_CARE_PROVIDER_SITE_OTHER): Payer: Medicare Other | Admitting: Licensed Clinical Social Worker

## 2018-05-02 DIAGNOSIS — G894 Chronic pain syndrome: Secondary | ICD-10-CM

## 2018-05-02 DIAGNOSIS — F341 Dysthymic disorder: Secondary | ICD-10-CM | POA: Diagnosis not present

## 2018-05-02 DIAGNOSIS — F4310 Post-traumatic stress disorder, unspecified: Secondary | ICD-10-CM | POA: Diagnosis not present

## 2018-05-02 DIAGNOSIS — F411 Generalized anxiety disorder: Secondary | ICD-10-CM

## 2018-05-02 DIAGNOSIS — F4321 Adjustment disorder with depressed mood: Secondary | ICD-10-CM

## 2018-05-02 DIAGNOSIS — F331 Major depressive disorder, recurrent, moderate: Secondary | ICD-10-CM

## 2018-05-02 DIAGNOSIS — Z634 Disappearance and death of family member: Secondary | ICD-10-CM | POA: Diagnosis not present

## 2018-05-02 DIAGNOSIS — F4329 Adjustment disorder with other symptoms: Secondary | ICD-10-CM | POA: Diagnosis not present

## 2018-05-02 NOTE — Progress Notes (Signed)
THERAPIST PROGRESS NOTE  Session Time: 1:03 PM to 2:00 PM  Participation Level: Active  Behavioral Response: CasualAlertEuthymic  Type of Therapy: Individual Therapy  Treatment Goals addressed:  Patient work on coping skills for stress management, decrease in anxiety and depression, coping  Interventions: Solution Focused, Strength-based, Supportive and Other: process of grief, effectiveness of assertive communication  Summary: Ashley Gomez is a 47 y.o. female who presents with two more deaths since last saw therapist, uncle died of cancer, son's friend died, shares "lots of deaths", it has been four in a row and "when it is going to end". Mom said something mean to brother and patient spoke up about it. Therapist pointed out the change she has seen with patient that she is speaking her mind and therapist saw this as a positive change for patient to speak up, letting people know how she feels, making sure to be heard by others. She explains that she thinks with everything that she went through with boyfriend, [name], and then when she was with sister, she said something that made her mad, was hurtful and got in a big argument. She believes that this has made her tougher. Shared her thoughts of situation at home, that son's girlfriend [name] puts on an act, sees her do that when son comes home, also laziness of not taking care of house or attending to children the way she needs to. Speaks up about her frustrations to her, not going to "bite tongue in order to not hurt her feelings". Discussed Christmas and shares that she hasn't enjoyed holidays since losing her daughter, [name]. Tries to be happy for the kids sake. Discussed traditions of the holidays that we had with loved ones we could engage in to help with grief, help to keep their memory live in our lives.  Patient relates she will do the empty chair, puts flowers on the grave.  Always shared what they were grateful for before dinner and  will ask everyone to do that.  Relates sadness of the holidays that her daughter and Janan Halter who she was pregnant with should be here and they are not and she cannot fix it.  Try not to dwell on it because two other kids and grandkids.  Discussed other stressor of growth on adrenal gland she is worried about.  Feels good about who her treatment provider is that is helpful.  Reviewed pros and cons of living with sister as opposed to living here and relates like being at sister's. It was quiet, went out and did things, not responsible for all things, happier here because closer to kids, Healther (can visit the grave when she wants) grandkids and likes treatment providers.dates she is not missing out on things they do does not want to miss out being part of their growing up.  Relates worries about adrenal gland and could be cancer and does not want to miss out.  Reviewed her relationship and relates she is okay with being with him.  He wants to be with him but does not want to worry, shares she worries for 10 people.    Therapist assess patient current functioning per report and processed feelings related to emotions come up at Christmas and also stressors in her household.  Therapist pointed out to patient positive changes she sees and patient being more assertive explained this helps her to get her needs met, be able to stand up for herself, set boundaries with people so that they are not able to  take advantage.  Discussed being hurt by people close to her that is cause her to be tougher, which has led to her being able to better take care of herself.  Discussed grieving of her daughter, discussed rituals that Christmas which helps to fill up there still part of her life in traditions and rituals that can bring their memory life.  Provided positive feedback for channeling patient's energy to remembering she has 2 kids and grandchildren still here and recognizing she has purpose as well as being available as she is  important to them in their lives.  Provided feedback that being assertive at home is also effective coping strategy and negotiating problems and stressors.  Processed feelings related to medical stressor, therapist facilitated patient channeling that and positive channel by recognizing she likes her medical provider and also right realizing making most of life when medical issues are not known.  Provided strength based intervention  Suicidal/Homicidal: No  Plan: Return again in 2 weeks.2.  Therapist work with patient on stress management, coping  Diagnosis: Axis I:  major depressive disorder, recurrent, moderate, generalized anxiety disorder PTSD, complicated grief, pain syndrome    Axis II: No diagnosis    Cordella Register, LCSW 05/02/2018

## 2018-05-21 ENCOUNTER — Other Ambulatory Visit: Payer: Self-pay | Admitting: Sports Medicine

## 2018-05-21 DIAGNOSIS — M5136 Other intervertebral disc degeneration, lumbar region: Secondary | ICD-10-CM

## 2018-05-23 ENCOUNTER — Other Ambulatory Visit (HOSPITAL_COMMUNITY): Payer: Self-pay | Admitting: Psychiatry

## 2018-05-23 ENCOUNTER — Ambulatory Visit (HOSPITAL_COMMUNITY): Payer: Medicare Other | Admitting: Licensed Clinical Social Worker

## 2018-05-23 DIAGNOSIS — Z79899 Other long term (current) drug therapy: Secondary | ICD-10-CM | POA: Diagnosis not present

## 2018-05-23 DIAGNOSIS — G8929 Other chronic pain: Secondary | ICD-10-CM | POA: Diagnosis not present

## 2018-05-23 DIAGNOSIS — M545 Low back pain: Secondary | ICD-10-CM | POA: Diagnosis not present

## 2018-05-23 DIAGNOSIS — F331 Major depressive disorder, recurrent, moderate: Secondary | ICD-10-CM

## 2018-05-23 DIAGNOSIS — G43009 Migraine without aura, not intractable, without status migrainosus: Secondary | ICD-10-CM | POA: Diagnosis not present

## 2018-05-27 ENCOUNTER — Ambulatory Visit (HOSPITAL_COMMUNITY): Payer: Medicare Other | Admitting: Psychiatry

## 2018-05-29 ENCOUNTER — Other Ambulatory Visit: Payer: Self-pay

## 2018-05-29 ENCOUNTER — Encounter (HOSPITAL_COMMUNITY): Payer: Self-pay | Admitting: Psychiatry

## 2018-05-29 ENCOUNTER — Ambulatory Visit (INDEPENDENT_AMBULATORY_CARE_PROVIDER_SITE_OTHER): Payer: Medicare Other | Admitting: Psychiatry

## 2018-05-29 ENCOUNTER — Telehealth: Payer: Self-pay | Admitting: Family Medicine

## 2018-05-29 ENCOUNTER — Ambulatory Visit (INDEPENDENT_AMBULATORY_CARE_PROVIDER_SITE_OTHER): Payer: Medicare Other | Admitting: Family Medicine

## 2018-05-29 ENCOUNTER — Encounter: Payer: Self-pay | Admitting: Family Medicine

## 2018-05-29 VITALS — BP 135/80 | HR 65 | Ht 67.0 in | Wt 253.0 lb

## 2018-05-29 VITALS — BP 130/82 | HR 62 | Ht 67.0 in | Wt 253.0 lb

## 2018-05-29 DIAGNOSIS — F4310 Post-traumatic stress disorder, unspecified: Secondary | ICD-10-CM

## 2018-05-29 DIAGNOSIS — G894 Chronic pain syndrome: Secondary | ICD-10-CM | POA: Diagnosis not present

## 2018-05-29 DIAGNOSIS — L309 Dermatitis, unspecified: Secondary | ICD-10-CM

## 2018-05-29 DIAGNOSIS — Z8379 Family history of other diseases of the digestive system: Secondary | ICD-10-CM | POA: Diagnosis not present

## 2018-05-29 DIAGNOSIS — R5383 Other fatigue: Secondary | ICD-10-CM

## 2018-05-29 DIAGNOSIS — F411 Generalized anxiety disorder: Secondary | ICD-10-CM | POA: Diagnosis not present

## 2018-05-29 DIAGNOSIS — R400 Somnolence: Secondary | ICD-10-CM

## 2018-05-29 DIAGNOSIS — G43009 Migraine without aura, not intractable, without status migrainosus: Secondary | ICD-10-CM | POA: Diagnosis not present

## 2018-05-29 DIAGNOSIS — G43901 Migraine, unspecified, not intractable, with status migrainosus: Secondary | ICD-10-CM

## 2018-05-29 DIAGNOSIS — R1084 Generalized abdominal pain: Secondary | ICD-10-CM

## 2018-05-29 MED ORDER — TRIAMCINOLONE 0.1 % CREAM:EUCERIN CREAM 1:1: 1.0000 "application " | TOPICAL_CREAM | Freq: Every day | CUTANEOUS | 1 refills | Status: DC

## 2018-05-29 MED ORDER — PREDNISONE 10 MG PO TABS: ORAL_TABLET | ORAL | 0 refills | Status: DC

## 2018-05-29 MED ORDER — KETOROLAC TROMETHAMINE 60 MG/2ML IM SOLN
60.0000 mg | Freq: Once | INTRAMUSCULAR | Status: AC
  Administered 2018-05-29: 60 mg via INTRAMUSCULAR

## 2018-05-29 NOTE — Progress Notes (Signed)
Acute Office Visit  Subjective:    Patient ID: Ashley Gomez, female    DOB: 1971/03/02, 48 y.o.   MRN: [id number]  Chief Complaint  Patient presents with  . Migraine    HPI Patient is in today for a migraine Headache.  She has been in the bed since Friday. She took her imitrex and vomited.  She says it does not normally do that but she has been fearful to take it again since then.  She is on Topamax 100 mg 3 times a day but most days only gets it in twice a day.  She is feels extremely tired and cannot quite figure out why.  She is wanting to be gene tested for celiac disease.  She says that she has a granddaughter who was just diagnosed and a grandson who they suspect probably has the same thing and they are in the process of testing.  She was also tested for sleep apnea at one point when she weighed about 100 pounds more and was positive but after she lost the weight it seemed to have resolved.  But she just feels excessively sleepy during the daytime.  Also complains of feeling hot and cold on and off throughout the night.  Wanted me to check to see if we have received prior authorization request for her topical cream.  She says she called and left a message with [name] regarding the need for prior authorization.  She still has persistant rash and itching on her arms. She wants to be tested for Polycythemia vera.  She had a normal CBC in October.    Past Medical History:  Diagnosis Date  . Anxiety   . Arthritis   . CVA (cerebral infarction)    Old CVA on MRI brain  . Depression   . DVT (deep venous thrombosis) ([place]) 38/02/1750   L basilic vein   . Facet hypertrophy of lumbar region    MRI 2007  . Fibromyalgia   . GERD (gastroesophageal reflux disease)   . Hypertension    no meds now, lost 50lbs  . Migraines   . MS (multiple sclerosis) ([place])   . Neuromuscular disorder ([place])   . Obesity   . PTSD (post-traumatic stress disorder)   . Seizures ([place]) 2011   no seizure since  onset  . Sleep apnea    diagnosed 20 ago, lost wt no CPAP now  . Smoking   . Stroke [facility])    doesn't know when    Past Surgical History:  Procedure Laterality Date  . CHOLECYSTECTOMY  10/2017  . CHONDROPLASTY Left 12/30/2014   Procedure: CHONDROPLASTY;  Surgeon: Leandrew Koyanagi, MD;  Location: [place];  Service: Orthopedics;  Laterality: Left;  . KNEE ARTHROSCOPY Left 12/30/2014   Procedure: LEFT KNEE ARTHROSCOPY WITH   CHONDROPLASTY;  Surgeon: Leandrew Koyanagi, MD;  Location: [place];  Service: Orthopedics;  Laterality: Left;  . PARTIAL KNEE ARTHROPLASTY Left 08/03/2016   Procedure: LEFT UNICOMPARTMENTAL KNEE ARTHROPLASTY;  Surgeon: Leandrew Koyanagi, MD;  Location: [place];  Service: Orthopedics;  Laterality: Left;  . TONSILLECTOMY    . TUBAL LIGATION  1992    Family History  Problem Relation Age of Onset  . Cancer Mother 80       melanoma  . Hypertension Sister   . Heart disease Sister        valve disease  . Depression Sister   . Diabetes Sister   . Sjogren's syndrome  Sister     Social History   Socioeconomic History  . Marital status: Single    Spouse name: Not on file  . Number of children: Not on file  . Years of education: Not on file  . Highest education level: Not on file  Occupational History  . Not on file  Social Needs  . Financial resource strain: Not on file  . Food insecurity:    Worry: Not on file    Inability: Not on file  . Transportation needs:    Medical: Not on file    Non-medical: Not on file  Tobacco Use  . Smoking status: Current Every Day Smoker    Packs/day: 1.00    Years: 25.00    Pack years: 25.00    Types: Cigarettes  . Smokeless tobacco: Never Used  . Tobacco comment: advised to d/c by 3 cigs per week.  Substance and Sexual Activity  . Alcohol use: No    Alcohol/week: 0.0 standard drinks    Comment: occasional  . Drug use: No  . Sexual activity: Yes    Birth control/protection: None, Post-menopausal   Lifestyle  . Physical activity:    Days per week: Not on file    Minutes per session: Not on file  . Stress: Not on file  Relationships  . Social connections:    Talks on phone: Not on file    Gets together: Not on file    Attends religious service: Not on file    Active member of club or organization: Not on file    Attends meetings of clubs or organizations: Not on file    Relationship status: Not on file  . Intimate partner violence:    Fear of current or ex partner: Not on file    Emotionally abused: Not on file    Physically abused: Not on file    Forced sexual activity: Not on file  Other Topics Concern  . Not on file  Social History Narrative  . Not on file    Outpatient Medications Prior to Visit  Medication Sig Dispense Refill  . acetaminophen (TYLENOL) 500 MG tablet Take 1,000 mg by mouth every 6 (six) hours as needed for mild pain or headache.    Marland Kitchen atorvastatin (LIPITOR) 20 MG tablet Take 1 tablet (20 mg total) by mouth daily. 90 tablet 3  . celecoxib (CELEBREX) 200 MG capsule TAKE 2 CAPSULES EVERY DAY AS NEEDED FOR PAIN 180 capsule 0  . DULoxetine (CYMBALTA) 60 MG capsule Take 1 capsule (60 mg total) by mouth 2 (two) times daily. 60 capsule 1  . gabapentin (NEURONTIN) 300 MG capsule Take 1 capsule (300 mg total) by mouth 3 (three) times daily. 270 capsule 1  . HYDROcodone-acetaminophen (NORCO) 10-325 MG tablet Take 1 tablet by mouth every 6 (six) hours as needed for pain. for pain  0  . metoprolol tartrate (LOPRESSOR) 25 MG tablet Take 0.5 tablets (12.5 mg total) by mouth 2 (two) times daily. 180 tablet 3  . omeprazole (PRILOSEC) 40 MG capsule Take 1 capsule (40 mg total) by mouth daily. 90 capsule 3  . ondansetron (ZOFRAN) 4 MG tablet take 1 to 2 tablet by mouth every 8 hours if needed for nausea and vomiting 30 tablet 1  . ranitidine (ZANTAC) 150 MG tablet TAKE 1 TABLET BY MOUTH TWICE A DAY 60 tablet 1  . SUMAtriptan (IMITREX) 100 MG tablet Take 1 tablet (100 mg  total) by mouth every 2 (two) hours as needed for  migraine. No more than 2 in 24 hours. 10 tablet 6  . topiramate (TOPAMAX) 200 MG tablet Take 1 tablet (200 mg total) by mouth 2 (two) times daily. 60 tablet 3  . amoxicillin-clavulanate (AUGMENTIN) 875-125 MG tablet Take 1 tablet by mouth 2 (two) times daily. 24 tablet 0  . Triamcinolone Acetonide (TRIAMCINOLONE 0.1 % CREAM : EUCERIN) CREA Apply 1 application topically at bedtime. 1 Jar. 1 each pRN  . clonazePAM (KLONOPIN) 1 MG tablet Take 1 tablet (1 mg total) by mouth 2 (two) times daily as needed for anxiety. NO EARLY REFILLS 60 tablet 3   No facility-administered medications prior to visit.     Allergies  Allergen Reactions  . Ace Inhibitors     REACTION: cough  . Atenolol Other (See Comments)    Bradycardia  . Celexa [Citalopram Hydrobromide] Other (See Comments)    Dizziness Tolerated Lexapro   . Codeine Nausea Only  . Nitroglycerin Other (See Comments)    Drop in BP  . Phenytoin Swelling  . Sertraline Other (See Comments)    unknown  . Triamcinolone     Steroid flare after joint injection, use other steroid for injections    ROS     Objective:    Physical Exam  BP 135/80   Pulse 65   Ht 5\' 7"  (1.702 m)   Wt 253 lb (114.8 kg)   LMP 12/28/2014   SpO2 96%   BMI 39.63 kg/m  Wt Readings from Last 3 Encounters:  05/29/18 253 lb (114.8 kg)  04/24/18 260 lb (117.9 kg)  04/10/18 257 lb (116.6 kg)    There are no preventive care reminders to display for this patient.  There are no preventive care reminders to display for this patient.   Lab Results  Component Value Date   TSH 0.53 02/15/2018   Lab Results  Component Value Date   WBC 6.4 02/15/2018   HGB 15.2 02/15/2018   HCT 46.6 (H) 02/15/2018   MCV 85.7 02/15/2018   PLT 254 02/15/2018   Lab Results  Component Value Date   NA 139 02/15/2018   K 4.0 02/15/2018   CO2 29 02/15/2018   GLUCOSE 93 02/15/2018   BUN 15 02/15/2018   CREATININE 0.85  02/15/2018   BILITOT 0.4 02/15/2018   ALKPHOS 98 11/29/2016   AST 12 02/15/2018   ALT 6 02/15/2018   PROT 6.6 02/15/2018   ALBUMIN 3.6 11/29/2016   CALCIUM 9.3 02/15/2018   ANIONGAP 11 08/04/2016   Lab Results  Component Value Date   CHOL 218 (H) 11/29/2016   Lab Results  Component Value Date   HDL 44 (L) 11/29/2016   Lab Results  Component Value Date   LDLCALC 144 (H) 11/29/2016   Lab Results  Component Value Date   TRIG 151 (H) 11/29/2016   Lab Results  Component Value Date   CHOLHDL 5.0 (H) 11/29/2016   Lab Results  Component Value Date   HGBA1C 5.6 07/31/2016       Assessment & Plan:   Problem List Items Addressed This Visit      Cardiovascular and Mediastinum   Migraine without aura - Primary    She is currently on Topamax 200 mg twice a day.  And she has been having more frequent uncontrolled migraine headaches.  We will consider switching her to a new class of medication such as Aimovig.  For now given Toradol injection and prednisone taper more acutely.      Relevant Medications  ketorolac (TORADOL) injection 60 mg (Completed)     Other   Family history of celiac disease   Relevant Orders   TPMT Genetic Test    Other Visit Diagnoses    Fatigue, unspecified type       Daytime somnolence       Relevant Orders   Split night study   Status migrainosus       Relevant Medications   ketorolac (TORADOL) injection 60 mg (Completed)   Generalized abdominal pain       Relevant Orders   TPMT Genetic Test     Daytime somnolence-we did repeat a stop bang questionnaire today.  Score of 3 which is consistent with high enough risk to screen for sleep apnea.  She had tested positive in the past but after significant weight loss had had a negative test.  Will order in lab study for further work-up and evaluation.  Family history of celiac-she would like to have gene testing done.  Labs ordered.  Meds ordered this encounter  Medications  . ketorolac  (TORADOL) injection 60 mg  . predniSONE (DELTASONE) 10 MG tablet    Sig: 8 tabs po Day 1, 6 tabs Day 2, 4 tabs Day 3, 2 tabs Day 5 and 1 tab Day 6    Dispense:  21 tablet    Refill:  0     Beatrice Lecher, MD
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
## 2018-05-29 NOTE — Assessment & Plan Note (Signed)
She is currently on Topamax 200 mg twice a day.  And she has been having more frequent uncontrolled migraine headaches.  We will consider switching her to a new class of medication such as Aimovig.  For now given Toradol injection and prednisone taper more acutely.

## 2018-05-29 NOTE — Telephone Encounter (Signed)
This medication or product is on your plan's list of covered drugs. Prior authorization is not required at this time. If your pharmacy has questions regarding the processing of your prescription, please have them call the [facility] pharmacy help desk at [phone]. **Please note: Formulary lowering, tiering exception, cost reduction and/or pre-benefit determination review (including prospective Medicare hospice reviews) requests cannot be requested using this method of submission. Please contact us at [phone] instead. Spoke with Amy at [facility] and she does not see that the cream is due for refill. She will make a note. Patient is aware. Updated Rx sent to pharmacy.

## 2018-05-30 ENCOUNTER — Ambulatory Visit (INDEPENDENT_AMBULATORY_CARE_PROVIDER_SITE_OTHER): Payer: Medicare Other | Admitting: Licensed Clinical Social Worker

## 2018-05-30 DIAGNOSIS — F4329 Adjustment disorder with other symptoms: Secondary | ICD-10-CM

## 2018-05-30 DIAGNOSIS — F331 Major depressive disorder, recurrent, moderate: Secondary | ICD-10-CM

## 2018-05-30 DIAGNOSIS — G894 Chronic pain syndrome: Secondary | ICD-10-CM | POA: Diagnosis not present

## 2018-05-30 DIAGNOSIS — F4310 Post-traumatic stress disorder, unspecified: Secondary | ICD-10-CM | POA: Diagnosis not present

## 2018-05-30 DIAGNOSIS — Z8379 Family history of other diseases of the digestive system: Secondary | ICD-10-CM | POA: Diagnosis not present

## 2018-05-30 DIAGNOSIS — F411 Generalized anxiety disorder: Secondary | ICD-10-CM | POA: Diagnosis not present

## 2018-05-30 DIAGNOSIS — F341 Dysthymic disorder: Secondary | ICD-10-CM | POA: Diagnosis not present

## 2018-05-30 DIAGNOSIS — R1084 Generalized abdominal pain: Secondary | ICD-10-CM | POA: Diagnosis not present

## 2018-05-30 DIAGNOSIS — Z634 Disappearance and death of family member: Secondary | ICD-10-CM

## 2018-05-30 DIAGNOSIS — F4321 Adjustment disorder with depressed mood: Secondary | ICD-10-CM
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
## 2018-06-06 ENCOUNTER — Encounter: Payer: Self-pay | Admitting: Family Medicine

## 2018-06-06 LAB — TPMT GENETIC TEST

## 2018-06-06 MED ORDER — ERENUMAB-AOOE 70 MG/ML ~~LOC~~ SOAJ: 70.0000 mg | SUBCUTANEOUS | 1 refills | Status: DC

## 2018-06-13 ENCOUNTER — Encounter: Payer: Self-pay | Admitting: Family Medicine

## 2018-06-18 ENCOUNTER — Telehealth: Payer: Self-pay

## 2018-06-18 ENCOUNTER — Ambulatory Visit (INDEPENDENT_AMBULATORY_CARE_PROVIDER_SITE_OTHER): Payer: Medicare Other | Admitting: Licensed Clinical Social Worker

## 2018-06-18 ENCOUNTER — Ambulatory Visit (INDEPENDENT_AMBULATORY_CARE_PROVIDER_SITE_OTHER): Payer: Medicare Other | Admitting: Family Medicine

## 2018-06-18 VITALS — BP 146/89 | HR 94 | Temp 97.4°F | Wt 256.0 lb

## 2018-06-18 DIAGNOSIS — F4310 Post-traumatic stress disorder, unspecified: Secondary | ICD-10-CM | POA: Diagnosis not present

## 2018-06-18 DIAGNOSIS — F411 Generalized anxiety disorder: Secondary | ICD-10-CM | POA: Diagnosis not present

## 2018-06-18 DIAGNOSIS — G43009 Migraine without aura, not intractable, without status migrainosus: Secondary | ICD-10-CM | POA: Diagnosis not present

## 2018-06-18 DIAGNOSIS — G894 Chronic pain syndrome: Secondary | ICD-10-CM | POA: Diagnosis not present

## 2018-06-18 DIAGNOSIS — F330 Major depressive disorder, recurrent, mild: Secondary | ICD-10-CM

## 2018-06-18 MED ORDER — KETOROLAC TROMETHAMINE 60 MG/2ML IM SOLN
60.0000 mg | Freq: Once | INTRAMUSCULAR | Status: AC
  Administered 2018-06-18: 60 mg via INTRAMUSCULAR

## 2018-06-18 NOTE — Telephone Encounter (Signed)
Ashley Gomez called and states she has had a headache for about a week. She would like to come in for injections. Please advise.

## 2018-06-18 NOTE — Telephone Encounter (Signed)
Placed on nusring schedule

## 2018-06-18 NOTE — Progress Notes (Signed)
48 year old female with a history of chronic headaches comes in today for persistent migraine headache.  Offered to give her Toradol injection which oftentimes relieves the persistent headache.  Patient tolerated well.  Agree with documentation as above.   Beatrice Lecher, MD

## 2018-06-18 NOTE — Progress Notes (Signed)
THERAPIST PROGRESS NOTE  Session Time: 11:02 AM to 11:54 AM  Participation Level: Active  Behavioral Response: CasualAlertLack of bright affect related to continuing symptoms of migraine  Type of Therapy: Individual Therapy  Treatment Goals addressed: Patient work on Radiographer, therapeutic for stress management, decrease in anxiety and depression, coping  Interventions: Solution Focused, Strength-based, Reframing and Other: stress management, coping, effective interpersonal strategies  Summary: Ashley Gomez is a 48 y.o. female who presents with just coming from doctor and getting a shot, Toradol, and feels better although feels sleepy and feels pain behind her eyes.  Knowledges she will need to rest.  Relates with Toradol migraine never completely goes away completely, ease it enough that she can function and what she describes as "seminormal".  Relates cannot sleep when she has them makes it bad she can become irritable quickly.  Shares she has had them 14 has been told related to hormone levels, has had them for the last 2 months and related this is not been the worst that she has had them over the years.  Shares wants to talk to her doctor about effective medication, friend is gone to a headache specialist who was related research that migraines are not related to hormones so will talk to Dr. about best medications to help her.  He also wants to stop smoking and relates does not make her feel good anymore, wants to get off some of the medications and relates for example, Topamax that she "feels like crap" and is supposed to help with migraines and seizures so wants to talk to Dr. about helpfulness of this med.  Gust other med Aimoivig that "doesn't touch the migraine".  Recognizes that it impacts her perspective with migraines plays a big part in increase in mental health symptoms.  Dates ongoing stress of son and his girlfriend in house and that son is not giving any money for bills.  Shares that she  cannot deal with it anymore, tired of worrying about bills being paid, has headaches and cannot do all of it and something is going to have to give.  She is upset about her own health problems, should not have to raise her son again, has told him he is selfish, girlfriend he does not take care of things.  Therapist shared she sees setting limits and standing up for herself as improving and encourage patient with setting limits as healthy.  Gust positive aspects of her life to help with mood patient shared about her dogs and grandchildren.  Patient able to see connection between stress at home and increase in her medical symptoms such as migraines. [place] living situation and others not taking care of things,  Therapist assess patient current functioning per report.  Assessed patient's having migraines as having significant impact on mood, also causes irritability.  Connected stress at home with impacting her medical issues including migraines.  Problem solved related to this issue and also encourage patient with her own thoughts of setting limits with them.  Needed for financial reasons but also will improve her mental health.  Processed feelings related to stressors.  Discussed positive aspects of her life to help in reframing that will help with mood.  Provided strength based and supportive intervention.   Suicidal/Homicidal: No  Plan: Return again in 2-3 weeks.2.  Therapist work with patient on stress management, effective interpersonal strategies  Diagnosis: Axis I: Major depressive disorder, recurrent, mild, generalized anxiety disorder PTSD, complicated grief, chronic pain syndrome  Axis II: No diagnosis    Cordella Register, LCSW 06/18/2018

## 2018-06-18 NOTE — Progress Notes (Signed)
Pt in today for injection for migraine. Per Dr. Madilyn Fireman patient is to receive 60 mg toradol. Pt received 60 mg toradol in [facility] without complications.

## 2018-06-20 ENCOUNTER — Encounter: Payer: Self-pay | Admitting: Family Medicine

## 2018-06-20 DIAGNOSIS — G43909 Migraine, unspecified, not intractable, without status migrainosus: Secondary | ICD-10-CM | POA: Diagnosis not present

## 2018-06-20 DIAGNOSIS — G8929 Other chronic pain: Secondary | ICD-10-CM | POA: Diagnosis not present

## 2018-06-20 DIAGNOSIS — Z79891 Long term (current) use of opiate analgesic: Secondary | ICD-10-CM | POA: Diagnosis not present

## 2018-06-20 DIAGNOSIS — M545 Low back pain: Secondary | ICD-10-CM | POA: Diagnosis not present

## 2018-06-20 DIAGNOSIS — Z79899 Other long term (current) drug therapy: Secondary | ICD-10-CM | POA: Diagnosis not present

## 2018-06-20 MED ORDER — CLONAZEPAM 1 MG PO TABS: 1.0000 mg | ORAL_TABLET | Freq: Two times a day (BID) | ORAL | 3 refills | Status: DC | PRN

## 2018-06-20 NOTE — Telephone Encounter (Signed)
rx sent

## 2018-06-21 ENCOUNTER — Ambulatory Visit (INDEPENDENT_AMBULATORY_CARE_PROVIDER_SITE_OTHER): Payer: Medicare Other | Admitting: Family Medicine

## 2018-06-21 ENCOUNTER — Encounter: Payer: Self-pay | Admitting: Family Medicine

## 2018-06-21 VITALS — BP 139/81 | HR 89 | Ht 67.0 in | Wt 260.0 lb

## 2018-06-21 DIAGNOSIS — F5101 Primary insomnia: Secondary | ICD-10-CM | POA: Diagnosis not present

## 2018-06-21 DIAGNOSIS — M2550 Pain in unspecified joint: Secondary | ICD-10-CM | POA: Diagnosis not present

## 2018-06-21 DIAGNOSIS — Z13220 Encounter for screening for lipoid disorders: Secondary | ICD-10-CM | POA: Diagnosis not present

## 2018-06-21 DIAGNOSIS — Z0000 Encounter for general adult medical examination without abnormal findings: Secondary | ICD-10-CM | POA: Diagnosis not present

## 2018-06-21 DIAGNOSIS — E785 Hyperlipidemia, unspecified: Secondary | ICD-10-CM | POA: Diagnosis not present

## 2018-06-21 DIAGNOSIS — I10 Essential (primary) hypertension: Secondary | ICD-10-CM

## 2018-06-21 DIAGNOSIS — R7301 Impaired fasting glucose: Secondary | ICD-10-CM | POA: Diagnosis not present

## 2018-06-21 DIAGNOSIS — G43019 Migraine without aura, intractable, without status migrainosus: Secondary | ICD-10-CM | POA: Diagnosis not present

## 2018-06-21 MED ORDER — TRAZODONE HCL 50 MG PO TABS: 25.0000 mg | ORAL_TABLET | Freq: Every evening | ORAL | 3 refills | Status: DC | PRN

## 2018-06-21 MED ORDER — ELETRIPTAN HYDROBROMIDE 40 MG PO TABS: 40.0000 mg | ORAL_TABLET | ORAL | 0 refills | Status: DC | PRN

## 2018-06-21 NOTE — Progress Notes (Signed)
Subjective:     Ashley Gomez is a 48 y.o. female and is here for a comprehensive physical exam. The patient reports problems - saw pain management. .  She did have a consultation at pain management.  They recommended that she get tested with a sed rate, ANA, rheumatoid factor, uric acid.  But she says they did not actually draw blood work.  The original provider that she saw has actually left and they have assigned her to someone else.  Insomnia-she reports that she is still not sleeping well.  She is a is also still reporting a real big ramp up in her migraines recently.  She is to take trazodone for sleep but has not been on it for quite some time.  Migraine headaches- she reporst her migraines have really amped up recently. She has been on topamax for years and feels like it is really no longer working for her.  Plus she says it makes her feel foggy brain and so she came off of it about a week ago.  She also reports that the Imitrex makes her very nauseated and would like to try something else besides that or Tylenol.  He was recently seen for status migrainous and given a prednisone taper.  She says it did help but the headaches are starting to come back again.  Social History   Socioeconomic History  . Marital status: Single    Spouse name: Not on file  . Number of children: Not on file  . Years of education: Not on file  . Highest education level: Not on file  Occupational History  . Not on file  Social Needs  . Financial resource strain: Not on file  . Food insecurity:    Worry: Not on file    Inability: Not on file  . Transportation needs:    Medical: Not on file    Non-medical: Not on file  Tobacco Use  . Smoking status: Current Every Day Smoker    Packs/day: 1.00    Years: 25.00    Pack years: 25.00    Types: Cigarettes  . Smokeless tobacco: Never Used  . Tobacco comment: advised to d/c by 3 cigs per week.  Substance and Sexual Activity  . Alcohol use: No   Alcohol/week: 0.0 standard drinks    Comment: occasional  . Drug use: No  . Sexual activity: Yes    Birth control/protection: None, Post-menopausal  Lifestyle  . Physical activity:    Days per week: Not on file    Minutes per session: Not on file  . Stress: Not on file  Relationships  . Social connections:    Talks on phone: Not on file    Gets together: Not on file    Attends religious service: Not on file    Active member of club or organization: Not on file    Attends meetings of clubs or organizations: Not on file    Relationship status: Not on file  . Intimate partner violence:    Fear of current or ex partner: Not on file    Emotionally abused: Not on file    Physically abused: Not on file    Forced sexual activity: Not on file  Other Topics Concern  . Not on file  Social History Narrative  . Not on file   Health Maintenance  Topic Date Due  . TETANUS/TDAP  10/17/2020  . PAP SMEAR-Modifier  10/27/2020  . INFLUENZA VACCINE  Completed  . HIV Screening  Completed    The following portions of the patient's history were reviewed and updated as appropriate: allergies, current medications, past family history, past medical history, past social history, past surgical history and problem list.  Review of Systems A comprehensive review of systems was negative.   Objective:    BP 139/81   Pulse 89   Ht 5\' 7"  (1.702 m)   Wt 260 lb (117.9 kg)   LMP 12/28/2014   SpO2 100%   BMI 40.72 kg/m  General appearance: alert, cooperative and appears stated age Head: Normocephalic, without obvious abnormality, atraumatic Eyes: conj clear, EOMI, PEERLA Ears: normal TM's and external ear canals both ears Nose: Nares normal. Septum midline. Mucosa normal. No drainage or sinus tenderness. Throat: lips, mucosa, and tongue normal; teeth and gums normal Neck: no adenopathy, no carotid bruit, no JVD, supple, symmetrical, trachea midline and thyroid not enlarged, symmetric, no  tenderness/mass/nodules Back: symmetric, no curvature. ROM normal. No CVA tenderness. Lungs: clear to auscultation bilaterally Breasts: normal appearance, no masses or tenderness Heart: regular rate and rhythm, S1, S2 normal, no murmur, click, rub or gallop Abdomen: soft, non-tender; bowel sounds normal; no masses,  no organomegaly Extremities: extremities normal, atraumatic, no cyanosis or edema Pulses: 2+ and symmetric Skin: Skin color, texture, turgor normal. No rashes or lesions Lymph nodes: Cervical, supraclavicular, and axillary nodes normal. Neurologic: Alert and oriented X 3, normal strength and tone. Normal symmetric reflexes. Normal coordination and gait    Assessment:    Healthy female exam.      Plan:     See After Visit Summary for Counseling Recommendations   Keep up a regular exercise program and make sure you are eating a healthy diet Try to eat 4 servings of dairy a day, or if you are lactose intolerant take a calcium with vitamin D daily.  Your vaccines are up to date.   Arthralgia with myofascial pain-we will go ahead and check additional labs just to rule out rheumatoid arthritis, lupus, and gout.  If abnormal then we will fax a copy to pain management.  Insomnia-We will restart her trazodone which she had taken previously and did well, with.  Migraine headaches-her Topamax does not seem to be working so she says she finally just stopped it about a week ago.  She felt like it always made her feel mentally sluggish as well.  We can try to work on getting her Aimovig covered under her insurance but she received notification that it would not be covered.  We will discontinue Imitrex and try Relpax instead.  Okay to continue with Tylenol as needed as well.  Lipidemia-due to recheck lipids.

## 2018-06-21 NOTE — Patient Instructions (Signed)

## 2018-06-23 ENCOUNTER — Encounter: Payer: Self-pay | Admitting: Family Medicine

## 2018-06-25 LAB — COMPLETE METABOLIC PANEL WITH GFR
AG Ratio: 1.6 (calc) (ref 1.0–2.5)
ALT: 9 U/L (ref 6–29)
AST: 17 U/L (ref 10–35)
Albumin: 4.2 g/dL (ref 3.6–5.1)
Alkaline phosphatase (APISO): 87 U/L (ref 31–125)
BUN: 9 mg/dL (ref 7–25)
CO2: 26 mmol/L (ref 20–32)
Calcium: 9.4 mg/dL (ref 8.6–10.2)
Chloride: 106 mmol/L (ref 98–110)
Creat: 0.8 mg/dL (ref 0.50–1.10)
GFR, Est African American: 100 mL/min/{1.73_m2} (ref >=60)
GFR, Est Non African American: 87 mL/min/{1.73_m2} (ref >=60)
Globulin: 2.6 g/dL (calc) (ref 1.9–3.7)
Glucose, Bld: 89 mg/dL (ref 65–99)
Potassium: 3.7 mmol/L (ref 3.5–5.3)
Sodium: 141 mmol/L (ref 135–146)
Total Bilirubin: 0.3 mg/dL (ref 0.2–1.2)
Total Protein: 6.8 g/dL (ref 6.1–8.1)

## 2018-06-25 LAB — LIPID PANEL
Cholesterol: 180 mg/dL (ref <200)
HDL: 46 mg/dL — ABNORMAL LOW (ref >=50)
LDL Cholesterol (Calc): 109 mg/dL (calc) — ABNORMAL HIGH
Non-HDL Cholesterol (Calc): 134 mg/dL (calc) — ABNORMAL HIGH (ref <130)
Total CHOL/HDL Ratio: 3.9 (calc) (ref <5.0)
Triglycerides: 137 mg/dL (ref <150)

## 2018-06-25 LAB — CBC
HCT: 45.8 % — ABNORMAL HIGH (ref 35.0–45.0)
Hemoglobin: 15.1 g/dL (ref 11.7–15.5)
MCH: 28.9 pg (ref 27.0–33.0)
MCHC: 33 g/dL (ref 32.0–36.0)
MCV: 87.7 fL (ref 80.0–100.0)
MPV: 11.1 fL (ref 7.5–12.5)
Platelets: 280 10*3/uL (ref 140–400)
RBC: 5.22 10*6/uL — ABNORMAL HIGH (ref 3.80–5.10)
RDW: 13.7 % (ref 11.0–15.0)
WBC: 9.1 10*3/uL (ref 3.8–10.8)

## 2018-06-25 LAB — HEMOGLOBIN A1C
Hgb A1c MFr Bld: 5.6 % of total Hgb (ref <5.7)
Mean Plasma Glucose: 114 (calc)
eAG (mmol/L): 6.3 (calc)

## 2018-06-25 LAB — ANA: Anti Nuclear Antibody(ANA): NEGATIVE

## 2018-06-25 LAB — SEDIMENTATION RATE: Sed Rate: 11 mm/h (ref 0–20)

## 2018-06-25 LAB — RHEUMATOID FACTOR: Rheumatoid fact SerPl-aCnc: <14 IU/mL (ref <14)

## 2018-06-25 LAB — CYCLIC CITRUL PEPTIDE ANTIBODY, IGG: Cyclic Citrullin Peptide Ab: <16 UNITS

## 2018-06-25 LAB — URIC ACID: Uric Acid, Serum: 3.8 mg/dL (ref 2.5–7.0)

## 2018-06-28 ENCOUNTER — Telehealth: Payer: Self-pay | Admitting: *Deleted

## 2018-06-28 NOTE — Telephone Encounter (Signed)
Called pt and informed her that her insurance company will only cover a 30 day supply of the relpax. They will cover the following: sumatriptan,rizatriptan,or naratriptan. She said that she had already p/u the 30 day supply of the relpax. And that she had called the insurance company and asked that they send her a list of the medications that they will cover and she would bring that into the office for her files.   She also asked about the Aimovig. I told her that I hadn't heard whether or not she was approved for this.Maryruth Eve, [place] Crocker

## 2018-07-09 ENCOUNTER — Encounter: Payer: Self-pay | Admitting: Family Medicine

## 2018-07-10 ENCOUNTER — Ambulatory Visit (INDEPENDENT_AMBULATORY_CARE_PROVIDER_SITE_OTHER): Payer: Medicare Other | Admitting: Family Medicine

## 2018-07-10 ENCOUNTER — Ambulatory Visit (INDEPENDENT_AMBULATORY_CARE_PROVIDER_SITE_OTHER): Payer: Medicare Other | Admitting: Licensed Clinical Social Worker

## 2018-07-10 ENCOUNTER — Encounter: Payer: Self-pay | Admitting: Family Medicine

## 2018-07-10 VITALS — BP 131/92 | HR 88 | Temp 98.2°F | Wt 259.0 lb

## 2018-07-10 DIAGNOSIS — J0111 Acute recurrent frontal sinusitis: Secondary | ICD-10-CM | POA: Diagnosis not present

## 2018-07-10 DIAGNOSIS — F4329 Adjustment disorder with other symptoms: Secondary | ICD-10-CM

## 2018-07-10 DIAGNOSIS — R7871 Abnormal lead level in blood: Secondary | ICD-10-CM | POA: Diagnosis not present

## 2018-07-10 DIAGNOSIS — K2970 Gastritis, unspecified, without bleeding: Secondary | ICD-10-CM | POA: Diagnosis not present

## 2018-07-10 DIAGNOSIS — Z634 Disappearance and death of family member: Secondary | ICD-10-CM

## 2018-07-10 DIAGNOSIS — F4310 Post-traumatic stress disorder, unspecified: Secondary | ICD-10-CM | POA: Diagnosis not present

## 2018-07-10 DIAGNOSIS — B9681 Helicobacter pylori [H. pylori] as the cause of diseases classified elsewhere: Secondary | ICD-10-CM

## 2018-07-10 DIAGNOSIS — F411 Generalized anxiety disorder: Secondary | ICD-10-CM | POA: Diagnosis not present

## 2018-07-10 DIAGNOSIS — F4321 Adjustment disorder with depressed mood: Secondary | ICD-10-CM

## 2018-07-10 DIAGNOSIS — G894 Chronic pain syndrome: Secondary | ICD-10-CM | POA: Diagnosis not present

## 2018-07-10 MED ORDER — CEFDINIR 300 MG PO CAPS: 300.0000 mg | ORAL_CAPSULE | Freq: Two times a day (BID) | ORAL | 0 refills | Status: DC

## 2018-07-10 MED ORDER — AZELASTINE HCL 0.1 % NA SOLN: 2.0000 | Freq: Two times a day (BID) | NASAL | 12 refills | Status: DC

## 2018-07-10 MED ORDER — PREDNISONE 10 MG PO TABS: 30.0000 mg | ORAL_TABLET | Freq: Every day | ORAL | 0 refills | Status: DC

## 2018-07-10 NOTE — Telephone Encounter (Signed)
Received a fax from [employer] that Aimovig has been approved through 10/08/2018. Pharmacy aware and form sent to scan. Patient advised.

## 2018-07-10 NOTE — Patient Instructions (Signed)
Thank you for coming in today. Take the medicine as prescribed.  Continue Sinusitis, Adult Sinusitis is inflammation of your sinuses. Sinuses are hollow spaces in the bones around your face. Your sinuses are located:  Around your eyes.  In the middle of your forehead.  Behind your nose.  In your cheekbones. Mucus normally drains out of your sinuses. When your nasal tissues become inflamed or swollen, mucus can become trapped or blocked. This allows bacteria, viruses, and fungi to grow, which leads to infection. Most infections of the sinuses are caused by a virus. Sinusitis can develop quickly. It can last for up to 4 weeks (acute) or for more than 12 weeks (chronic). Sinusitis often develops after a cold. What are the causes? This condition is caused by anything that creates swelling in the sinuses or stops mucus from draining. This includes:  Allergies.  Asthma.  Infection from bacteria or viruses.  Deformities or blockages in your nose or sinuses.  Abnormal growths in the nose (nasal polyps).  Pollutants, such as chemicals or irritants in the air.  Infection from fungi (rare). What increases the risk? You are more likely to develop this condition if you:  Have a weak body defense system (immune system).  Do a lot of swimming or diving.  Overuse nasal sprays.  Smoke. What are the signs or symptoms? The main symptoms of this condition are pain and a feeling of pressure around the affected sinuses. Other symptoms include:  Stuffy nose or congestion.  Thick drainage from your nose.  Swelling and warmth over the affected sinuses.  Headache.  Upper toothache.  A cough that may get worse at night.  Extra mucus that collects in the throat or the back of the nose (postnasal drip).  Decreased sense of smell and taste.  Fatigue.  A fever.  Sore throat.  Bad breath. How is this diagnosed? This condition is diagnosed based on:  Your symptoms.  Your  medical history.  A physical exam.  Tests to find out if your condition is acute or chronic. This may include: ? Checking your nose for nasal polyps. ? Viewing your sinuses using a device that has a light (endoscope). ? Testing for allergies or bacteria. ? Imaging tests, such as an MRI or CT scan. In rare cases, a bone biopsy may be done to rule out more serious types of fungal sinus disease. How is this treated? Treatment for sinusitis depends on the cause and whether your condition is chronic or acute.  If caused by a virus, your symptoms should go away on their own within 10 days. You may be given medicines to relieve symptoms. They include: ? Medicines that shrink swollen nasal passages (topical intranasal decongestants). ? Medicines that treat allergies (antihistamines). ? A spray that eases inflammation of the nostrils (topical intranasal corticosteroids). ? Rinses that help get rid of thick mucus in your nose (nasal saline washes).  If caused by bacteria, your health care provider may recommend waiting to see if your symptoms improve. Most bacterial infections will get better without antibiotic medicine. You may be given antibiotics if you have: ? A severe infection. ? A weak immune system.  If caused by narrow nasal passages or nasal polyps, you may need to have surgery. Follow these instructions at home: Medicines  Take, use, or apply over-the-counter and prescription medicines only as told by your health care provider. These may include nasal sprays.  If you were prescribed an antibiotic medicine, take it as told by your  health care provider. Do not stop taking the antibiotic even if you start to feel better. Hydrate and humidify   Drink enough fluid to keep your urine pale yellow. Staying hydrated will help to thin your mucus.  Use a cool mist humidifier to keep the humidity level in your home above 50%.  Inhale steam for 10-15 minutes, 3-4 times a day, or as told by  your health care provider. You can do this in the bathroom while a hot shower is running.  Limit your exposure to cool or dry air. Rest  Rest as much as possible.  Sleep with your head raised (elevated).  Make sure you get enough sleep each night. General instructions   Apply a warm, moist washcloth to your face 3-4 times a day or as told by your health care provider. This will help with discomfort.  Wash your hands often with soap and water to reduce your exposure to germs. If soap and water are not available, use hand sanitizer.  Do not smoke. Avoid being around people who are smoking (secondhand smoke).  Keep all follow-up visits as told by your health care provider. This is important. Contact a health care provider if:  You have a fever.  Your symptoms get worse.  Your symptoms do not improve within 10 days. Get help right away if:  You have a severe headache.  You have persistent vomiting.  You have severe pain or swelling around your face or eyes.  You have vision problems.  You develop confusion.  Your neck is stiff.  You have trouble breathing. Summary  Sinusitis is soreness and inflammation of your sinuses. Sinuses are hollow spaces in the bones around your face.  This condition is caused by nasal tissues that become inflamed or swollen. The swelling traps or blocks the flow of mucus. This allows bacteria, viruses, and fungi to grow, which leads to infection.  If you were prescribed an antibiotic medicine, take it as told by your health care provider. Do not stop taking the antibiotic even if you start to feel better.  Keep all follow-up visits as told by your health care provider. This is important. This information is not intended to replace advice given to you by your health care provider. Make sure you discuss any questions you have with your health care provider. Document Released: 05/01/2005 Document Revised: 10/01/2017 Document Reviewed:  10/01/2017 Elsevier Interactive Patient Education  [employer].  over the counter medicines as needed.   Get labs at [facility] to follow up lead and H. Pylori.   Keep track of blood pressure.   Recheck as needed.

## 2018-07-10 NOTE — Progress Notes (Signed)
Ashley Gomez is a 48 y.o. female who presents to [facility] today for sinus pain and pressur cough and congestion.  Symptoms present for about 6 days.  Symptoms are slightly worsening.  No significant shortness of breath.  Positive sick contacts with similar illness.  She is tried NyQuil which helps a bit.  Her symptoms are consistent with previous episodes of sinusitis.  Her symptoms are not consistent with migraines.  She is done well with steroids and antibiotics in similar situations in the past.  Additionally she notes that her son recently tested positive for H. pylori on a biopsy.  Her son does live with her and she is worried about her risk for H. pylori.  She has had stomach issues in the past.  She is thought to have IBS.  She takes omeprazole and famotidine daily.  Additionally her grandchild who lives with her also tested positive for elevated lead blood levels.  Her son works at a [employer] and does have exposure to lead sometimes at work.   ROS as above:  Exam:  BP (!) 131/92   Pulse 88   Temp 98.2 F (36.8 C) (Oral)   Wt 259 lb (117.5 kg)   LMP 12/28/2014   SpO2 99%   BMI 40.57 kg/m  Wt Readings from Last 5 Encounters:  07/10/18 259 lb (117.5 kg)  06/21/18 260 lb (117.9 kg)  06/18/18 256 lb (116.1 kg)  05/29/18 253 lb (114.8 kg)  04/24/18 260 lb (117.9 kg)    Gen: Well NAD HEENT: EOMI,  MMM clear nasal discharge.  Inflamed nasal turbinates bilaterally.  Normal tympanic membranes bilaterally.  Posterior pharynx with cobblestoning.  Minimal cervical lymphadenopathy.  Tender palpation frontal sinuses bilaterally. Lungs: Normal work of breathing. CTABL Heart: RRR no MRG Abd: NABS, Soft. Nondistended, Nontender Exts: Brisk capillary refill, warm and well perfused.   Lab and Radiology Results No results found for this or any previous  visit (from the past 72 hour(s)). No results found.    Assessment and Plan: 48 y.o. female with  Sinusitis.  Likely bacterial at this point.  Plan to treat with prednisone and Omnicef.  Recheck if not improving.  Exposure to H. pylori: Reasonable to proceed with testing.  Patient unfortunately is on PPI and likely cannot stop.  We will proceed with blood antibody levels at [facility] is a good first step.  If those are positive it probably is reasonable to treat.   Additionally she lives in a household with a child who recently had an elevated lead blood level.  She definitely has some exposure with her son's job and I think is reasonable to also proceed with lab blood testing as well for her.  Follow-up with PCP in the near future for elevated blood pressure.  PDMP reviewed during this encounter. Orders Placed This Encounter  Procedures  . Helicobacter pylori abs-IgG+IgA, bld  . Lead, blood    Order Specific Question:   [place] of residence?    Answer:   [place] [[id number]]   Meds ordered this encounter  Medications  . predniSONE (DELTASONE) 10 MG tablet    Sig: Take 3 tablets (30 mg total) by mouth daily with breakfast.    Dispense:  15 tablet    Refill:  0  . cefdinir (OMNICEF) 300 MG capsule    Sig: Take 1 capsule (300 mg total) by mouth 2 (two) times daily.    Dispense:  14 capsule    Refill:  0  . azelastine (ASTELIN) 0.1 % nasal spray    Sig: Place 2 sprays into both nostrils 2 (two) times daily. Use in each nostril as directed    Dispense:  30 mL    Refill:  12     Historical information moved to improve visibility of documentation.  Past Medical History:  Diagnosis Date  . Anxiety   . Arthritis   . CVA (cerebral infarction)    Old CVA on MRI brain  . Depression   . DVT (deep venous thrombosis) ([place]) 24/23/5361   L basilic vein   . Facet hypertrophy of lumbar region    MRI 2007  . Fibromyalgia   . GERD (gastroesophageal reflux disease)   . Hypertension    no meds  now, lost 50lbs  . Migraines   . MS (multiple sclerosis) ([place])   . Neuromuscular disorder ([place])   . Obesity   . PTSD (post-traumatic stress disorder)   . Seizures ([place]) 2011   no seizure since onset  . Sleep apnea    diagnosed 20 ago, lost wt no CPAP now  . Smoking   . Stroke [facility])    doesn't know when   Past Surgical History:  Procedure Laterality Date  . CHOLECYSTECTOMY  10/2017  . CHONDROPLASTY Left 12/30/2014   Procedure: CHONDROPLASTY;  Surgeon: Leandrew Koyanagi, MD;  Location: [place];  Service: Orthopedics;  Laterality: Left;  . KNEE ARTHROSCOPY Left 12/30/2014   Procedure: LEFT KNEE ARTHROSCOPY WITH   CHONDROPLASTY;  Surgeon: Leandrew Koyanagi, MD;  Location: [place];  Service: Orthopedics;  Laterality: Left;  . PARTIAL KNEE ARTHROPLASTY Left 08/03/2016   Procedure: LEFT UNICOMPARTMENTAL KNEE ARTHROPLASTY;  Surgeon: Leandrew Koyanagi, MD;  Location: [place];  Service: Orthopedics;  Laterality: Left;  . TONSILLECTOMY    . TUBAL LIGATION  1992   Social History   Tobacco Use  . Smoking status: Current Every Day Smoker    Packs/day: 1.00    Years: 25.00    Pack years: 25.00    Types: Cigarettes  . Smokeless tobacco: Never Used  . Tobacco comment: advised to d/c by 3 cigs per week.  Substance Use Topics  . Alcohol use: No    Alcohol/week: 0.0 standard drinks    Comment: occasional   family history includes Cancer (age of onset: 76) in her mother; Depression in her sister; Diabetes in her sister; Heart disease in her sister; Hypertension in her sister; Sjogren's syndrome in her sister.  Medications: Current Outpatient Medications  Medication Sig Dispense Refill  . acetaminophen (TYLENOL) 500 MG tablet Take 1,000 mg by mouth every 6 (six) hours as needed for mild pain or headache.    . celecoxib (CELEBREX) 200 MG capsule TAKE 2 CAPSULES EVERY DAY AS NEEDED FOR PAIN 180 capsule 0  . clonazePAM (KLONOPIN) 1 MG tablet Take 1 tablet (1 mg total) by mouth  2 (two) times daily as needed for up to 30 days for anxiety. NO EARLY REFILLS 60 tablet 3  . dicyclomine (BENTYL) 20 MG tablet Take 20 mg by mouth.    . DULoxetine (CYMBALTA) 60 MG capsule Take 1 capsule (60 mg total) by mouth 2 (two) times daily. 60 capsule 1  . eletriptan (RELPAX) 40 MG tablet Take 1 tablet (40 mg total) by mouth as needed for migraine or headache. May repeat in 2 hours if headache persists or recurs. 10 tablet 0  . Erenumab-aooe ([place])  70 MG/ML SOAJ Inject 70 mg into the skin every 30 (thirty) days. 1 mL 1  . gabapentin (NEURONTIN) 300 MG capsule Take 1 capsule (300 mg total) by mouth 3 (three) times daily. 270 capsule 1  . HYDROcodone-acetaminophen (NORCO) 10-325 MG tablet Take 1 tablet by mouth every 6 (six) hours as needed for pain. for pain  0  . omeprazole (PRILOSEC) 40 MG capsule Take 1 capsule (40 mg total) by mouth daily. 90 capsule 3  . ondansetron (ZOFRAN) 4 MG tablet take 1 to 2 tablet by mouth every 8 hours if needed for nausea and vomiting 30 tablet 1  . ranitidine (ZANTAC) 150 MG tablet TAKE 1 TABLET BY MOUTH TWICE A DAY 60 tablet 1  . traZODone (DESYREL) 50 MG tablet Take 0.5-2 tablets (25-100 mg total) by mouth at bedtime as needed for sleep. 45 tablet 3  . Triamcinolone Acetonide (TRIAMCINOLONE 0.1 % CREAM : EUCERIN) CREA Apply 1 application topically at bedtime. 1 Jar. 1 each 1  . azelastine (ASTELIN) 0.1 % nasal spray Place 2 sprays into both nostrils 2 (two) times daily. Use in each nostril as directed 30 mL 12  . cefdinir (OMNICEF) 300 MG capsule Take 1 capsule (300 mg total) by mouth 2 (two) times daily. 14 capsule 0  . predniSONE (DELTASONE) 10 MG tablet Take 3 tablets (30 mg total) by mouth daily with breakfast. 15 tablet 0   No current facility-administered medications for this visit.    Allergies  Allergen Reactions  . Ace Inhibitors     REACTION: cough  . Atenolol Other (See Comments)    Bradycardia  . Celexa [Citalopram Hydrobromide] Other  (See Comments)    Dizziness Tolerated Lexapro   . Codeine Nausea Only  . Nitroglycerin Other (See Comments)    Drop in BP  . Phenytoin Swelling  . Sertraline Other (See Comments)    unknown  . Sumatriptan Nausea Only  . Topamax [Topiramate] Other (See Comments)    She reports that this makes her forgetful and causes her to studder  . Triamcinolone     Steroid flare after joint injection, use other steroid for injections     Discussed warning signs or symptoms. Please see discharge instructions. Patient expresses understanding.

## 2018-07-10 NOTE — Progress Notes (Signed)
THERAPIST PROGRESS NOTE  Session Time: 11:05 AM to 11:57 AM  Participation Level: Active  Behavioral Response: CasualAlertEuphoric, Irritable and tearful when talking about daughter she lost  Type of Therapy: Individual Therapy  Treatment Goals addressed: Patient work on Radiographer, therapeutic for stress management, decrease in anxiety and depression, coping  Interventions: Solution Focused, Strength-based, Supportive and Other: stress management, emotional regulation skills, coping  Summary: Ashley Gomez is a 48 y.o. female who presents with update to sickle problems, has headaches but less severe but now has sinus problems.  Shares that she kicked her son and girlfriend, Ashley Gomez, out of the house.  Both of them said a lot of mean things, called by girlfriend a manipulator and liar, does not care about her son.  For her to talk that way to her, patient knowing that is not true and not the person she is, shares that she has lost all respect for girlfriend, person crosses the line with her when talks about her family.  They came back and Ashley Gomez is pregnant, notices she does a lot of things for attention.  Electricity and water shut off because they have not paid the bill but patient's daughter help pay bill.  Says that she "stays stressed and mad" with them around although not hateful since they have been back, thinks they change their attitude when found about life insurance policy. Also happy that her grandchildren are back. Shares that she is at "wit's end with everybody".  Shares she these people not treating her well and not going to let them treat her like that.  Therapist pointed out positive aspects of being assertive (see below) shared that she has been thinking about Ashley Gomez, the daughter she lostover the last few weeks, one night wide awake all night, "everything going through her mind more. Tries to think of other things.(Processed through grief see below).  Shares frustration with girlfriend  feels she is taking advantage of patient, does not appreciate her kids the way she needs to, her kids are here well patient has lost her daughter.  Discussed about 30% of time angry, when she is like that she does not open her mouth, will go to her room and let the feeling passed, finds way to communicate such as "I need my meds" to let people know she is upset.  Shared that has not been able to visit grave of daughter since last August, cannot bring herself back and became tearful in session.  Reviewed session and patient related helpful to vent.    Therapist assessed patient current functioning per report and therapist helped her processed through feelings related to ongoing stressors.  Notified positive outcome for struggles in her relationships is becoming more assertive recognizing that she does not deserve to be treated badly.  Discussed importance of setting boundaries and being up assertive so people do not walk over you, allows you to take care of your needs, make them a priority and the outcome is to improve quality of life when one is looking out for ones needs.  Therapist also validated patient with struggles in her relationships and stressors.  Reviewed problem solving to help with stressors.  Reviewed anger issues and patient uses emotional regulation strategies of sitting with her motion and letting it pass.  Discussed this is a effective strategy as when one resists emotions it makes it worse and emotions are like waves that come and go more helpful to ride the wave and move through the emotion, also process  it and release it.  Patient is also found ways to communicate her anger.  Processed through increased emotions related to grief, discuss feeling more loss and lack of her daughter in her life when there are current struggles in life, feels a loss more keenly.  Also the realization that grief will come in waves and is where we will experience the feeling of significant loss.  Identified trigger  of son's girlfriend who does not appreciate she feels having her kids here when she has loss her daughter.  Provided strength based and supportive interventions.  Working on strategies to address goal and stress management, through emotional regulation skills and stress management addressing decrease in anxiety and depression, coping.  Suicidal/Homicidal: No  Plan: Return again in 2 weeks.2.  Therapist continue to work with patient on stress management, emotional regulation skills  Diagnosis: Axis I: generalized anxiety disorder PTSD, complicated grief, chronic pain syndrome,     Axis II: No diagnosis    Cordella Register, LCSW 07/10/2018

## 2018-07-11 ENCOUNTER — Encounter (HOSPITAL_BASED_OUTPATIENT_CLINIC_OR_DEPARTMENT_OTHER): Payer: Self-pay

## 2018-07-13 LAB — LEAD, BLOOD (ADULT >= 16 YRS): Lead-Whole Blood: 2 ug/dL (ref 0–4)

## 2018-07-13 LAB — HELICOBACTER PYLORI ABS-IGG+IGA, BLD
H. pylori, IgA Abs: <9 units (ref 0.0–8.9)
H. pylori, IgG AbS: 0.57 Index Value (ref 0.00–0.79)

## 2018-07-17 ENCOUNTER — Encounter: Payer: Self-pay | Admitting: Family Medicine

## 2018-07-19 ENCOUNTER — Other Ambulatory Visit: Payer: Self-pay | Admitting: *Deleted

## 2018-07-19 DIAGNOSIS — G43009 Migraine without aura, not intractable, without status migrainosus: Secondary | ICD-10-CM | POA: Diagnosis not present

## 2018-07-19 DIAGNOSIS — J328 Other chronic sinusitis: Secondary | ICD-10-CM | POA: Diagnosis not present

## 2018-07-19 DIAGNOSIS — B9689 Other specified bacterial agents as the cause of diseases classified elsewhere: Secondary | ICD-10-CM | POA: Diagnosis not present

## 2018-07-19 DIAGNOSIS — Z79891 Long term (current) use of opiate analgesic: Secondary | ICD-10-CM | POA: Diagnosis not present

## 2018-07-19 DIAGNOSIS — Z79899 Other long term (current) drug therapy: Secondary | ICD-10-CM | POA: Diagnosis not present

## 2018-07-22 DIAGNOSIS — Z7689 Persons encountering health services in other specified circumstances: Secondary | ICD-10-CM | POA: Diagnosis not present

## 2018-07-22 LAB — ESTRADIOL: Estradiol: <5

## 2018-07-22 LAB — CBC
BASO(ABSOLUTE): 0
Basophil: 0
EOS (ABSOLUTE): 0.1
EOS: 1 %
HCT: 44 — AB (ref 29–41)
Hemoglobin: 14
LYMPH: 23 %
Lymphs Abs: 2
MCH: 29
MCHC: 32
MCV: 91 (ref 76–111)
Monocytes(Absolute): 0.6
Monocytes: 6
Neutrophils absolute (GR#): 6.1 10*3/uL (ref 1.7–7.8)
Neutrophils: 69
Platelet: 244
RBC: 4.82 (ref 3.87–5.11)
RDW: 15
WBC: 8.9

## 2018-07-22 LAB — LEPTIN: Leptin, Serum: 124.6

## 2018-07-22 LAB — VITAMIN D 25 HYDROXY (VIT D DEFICIENCY, FRACTURES): Vit D, 25-Hydroxy: 12.8

## 2018-07-22 LAB — HEMOGLOBIN A1C: A1c: 5.9

## 2018-07-22 LAB — LIPID PANEL
Cholesterol, Total: 177
HDL Cholesterol: 59 (ref 35–70)
LDL-C: 97
Triglycerides: 106 (ref 40–160)
VLDL Cholesterol Cal: 21

## 2018-07-22 LAB — FERRITIN, SERUM (SERIAL): Ferritin: 27

## 2018-07-22 LAB — TESTOSTERONE, FREE: TESTOSTERONE FREE: <3

## 2018-07-22 LAB — T3, FREE: T3, Free: 2.9

## 2018-07-22 LAB — FOLLICLE STIMULATING HORMONE: FSH: 24

## 2018-07-22 LAB — THYROID PEROXIDASE ANTIBODIES (TPO) (REFL): Thyroid Peroxidase Ab: 14

## 2018-07-22 LAB — T4: Thyroxine (T4): 7.4

## 2018-07-23 ENCOUNTER — Ambulatory Visit: Payer: Self-pay | Admitting: Family Medicine

## 2018-07-24 ENCOUNTER — Ambulatory Visit: Payer: Self-pay | Admitting: Family Medicine

## 2018-07-24 DIAGNOSIS — R030 Elevated blood-pressure reading, without diagnosis of hypertension: Secondary | ICD-10-CM | POA: Diagnosis not present

## 2018-07-24 DIAGNOSIS — N951 Menopausal and female climacteric states: Secondary | ICD-10-CM | POA: Diagnosis not present

## 2018-07-24 DIAGNOSIS — R7309 Other abnormal glucose: Secondary | ICD-10-CM | POA: Diagnosis not present

## 2018-07-26 ENCOUNTER — Other Ambulatory Visit: Payer: Self-pay

## 2018-07-26 ENCOUNTER — Ambulatory Visit (INDEPENDENT_AMBULATORY_CARE_PROVIDER_SITE_OTHER): Payer: Medicare Other | Admitting: Family Medicine

## 2018-07-26 ENCOUNTER — Encounter: Payer: Self-pay | Admitting: Family Medicine

## 2018-07-26 VITALS — BP 146/65 | HR 73 | Ht 67.0 in | Wt 266.0 lb

## 2018-07-26 DIAGNOSIS — E559 Vitamin D deficiency, unspecified: Secondary | ICD-10-CM

## 2018-07-26 DIAGNOSIS — F411 Generalized anxiety disorder: Secondary | ICD-10-CM

## 2018-07-26 DIAGNOSIS — E538 Deficiency of other specified B group vitamins: Secondary | ICD-10-CM | POA: Diagnosis not present

## 2018-07-26 DIAGNOSIS — G43019 Migraine without aura, intractable, without status migrainosus: Secondary | ICD-10-CM

## 2018-07-26 DIAGNOSIS — I10 Essential (primary) hypertension: Secondary | ICD-10-CM

## 2018-07-26 MED ORDER — ERENUMAB-AOOE 70 MG/ML ~~LOC~~ SOAJ: 70.0000 mg | SUBCUTANEOUS | 0 refills | Status: DC

## 2018-07-26 MED ORDER — CLONAZEPAM 1 MG PO TABS: 0.5000 mg | ORAL_TABLET | Freq: Two times a day (BID) | ORAL | 1 refills | Status: DC | PRN

## 2018-07-26 MED ORDER — VITAMIN D (ERGOCALCIFEROL) 1.25 MG (50000 UNIT) PO CAPS: 50000.0000 [IU] | ORAL_CAPSULE | ORAL | 0 refills | Status: DC

## 2018-07-26 NOTE — Patient Instructions (Signed)
Take calcium 500mg  with vitamin D twice a day.  I will send over the 50, 000 IU capsule of Vitamin D2 once a week.

## 2018-07-26 NOTE — Progress Notes (Signed)
Subjective:    CC:   HPI:  48 year old female comes in today for follow-up for couple of concerns including migraine headaches.  When I last saw her in regards to her migraine she had discontinued her Topamax because she felt like it was not effective and it was making her feel mentally sluggish.  We also changed her rescue medication to Relpax. She is now on Aimovig.  She does feel like the Aimovig might be helping.  She is been on it for 2 months now and after another month she can theoretically go up on the dose if needed.  But she also recently stopped her gabapentin and feels like that has actually helped her headaches more than anything.  But now she is concerned because she is not on any type of antiseizure drug.  She is also off the topiramate.  Hypertension- Pt denies chest pain, SOB, dizziness, or heart palpitations.  Taking meds as directed w/o problems.  Denies medication side effects.    Follow-up insomnia-we also decided to restart her trazodone which she had used previously for insomnia.  He also went to [facility] which is a private weight loss retail clinic.  She had some blood work drawn there and brought a copy of those with her today.  She notably had low testosterone, vitamin D deficiency as well as low iron.  Please see scanned documents.  She wanted to have some questions answered about these.  She wanted to know if there is also a prescription day that she could take.  She does have some calcium with vitamin D at home as well.  She says at one point in time she also had low B12 and they have offered to give her B12 shots but they did not actually check her levels.  Past medical history, Surgical history, Family history not pertinant except as noted below, Social history, Allergies, and medications have been entered into the medical record, reviewed, and corrections made.   Review of Systems: No fevers, chills, night sweats, weight loss, chest pain, or shortness of breath.    Objective:    General: Well Developed, well nourished, and in no acute distress.  Neuro: Alert and oriented x3, extra-ocular muscles intact, sensation grossly intact.  HEENT: Normocephalic, atraumatic  Skin: Warm and dry, no rashes. Cardiac: Regular rate and rhythm, no murmurs rubs or gallops, no lower extremity edema.  Respiratory: Clear to auscultation bilaterally. Not using accessory muscles, speaking in full sentences.   Impression and Recommendations:    Migraine headaches -do seem slightly improved at least after coming off the gabapentin.  I did go ahead and take that off her medication list that she is no longer taking it.  We will continue with Aimovig 70 mg dose for at least 1 more month and if at that point she wants to go up we can increase it and see if it is more effective for her.  HTN -pressure elevated today.  We forgot to recheck it before she left but we will continue to monitor it carefully.  Insomnia -continue current regimen.  History of B12 deficiency-we will recheck levels if they are low then we can initiate B12 injections.  Vitamin D deficiency-we will start her on the 50,000 IU caplet once a week in addition to 800 IU and her calcium.  Hypocalcemia-levels were just borderline encouraged her to start over-the-counter calcium if she does not get a lot of calcium in her diet.  Anxiety-we did discuss continuing to decrease her  clonazepam since she is on chronic narcotics.  She was originally on 90/month she is now down to 60/month.  We will continue to wean medication.  We will work on other ways to help reduce her anxiety.  I actually think she be a great candidate for therapy/counseling.  He has refills currently but only get to June we will decrease her down to 50 tabs per month.  I did go ahead and send his prescription that it is dated for June.

## 2018-07-29 DIAGNOSIS — E559 Vitamin D deficiency, unspecified: Secondary | ICD-10-CM | POA: Diagnosis not present

## 2018-07-30 ENCOUNTER — Ambulatory Visit (INDEPENDENT_AMBULATORY_CARE_PROVIDER_SITE_OTHER): Payer: Medicare Other

## 2018-07-30 ENCOUNTER — Other Ambulatory Visit: Payer: Self-pay

## 2018-07-30 ENCOUNTER — Ambulatory Visit (INDEPENDENT_AMBULATORY_CARE_PROVIDER_SITE_OTHER): Payer: Medicare Other | Admitting: Sports Medicine

## 2018-07-30 ENCOUNTER — Encounter: Payer: Self-pay | Admitting: Sports Medicine

## 2018-07-30 DIAGNOSIS — M7062 Trochanteric bursitis, left hip: Secondary | ICD-10-CM

## 2018-07-30 DIAGNOSIS — S79912A Unspecified injury of left hip, initial encounter: Secondary | ICD-10-CM | POA: Diagnosis not present

## 2018-07-30 DIAGNOSIS — M25552 Pain in left hip: Secondary | ICD-10-CM

## 2018-07-30 NOTE — Assessment & Plan Note (Addendum)
Previous injection was approximately 3 years ago, repeat trochanteric bursa injection on the left, x-rays. If persistent pain we will proceed with a hip joint injection, some of the pain was referrable to the femoroacetabular joint. Return as needed.

## 2018-07-30 NOTE — Progress Notes (Signed)
Subjective:    CC: Left hip pain  HPI: Severe left hip pain over the last few days, worsening.  Localized laterally, and deep in the buttock.  Severe, persistent, localized without radiation, no trauma.  I reviewed the past medical history, family history, social history, surgical history, and allergies today and no changes were needed.  Please see the problem list section below in epic for further details.  Past Medical History: Past Medical History:  Diagnosis Date  . Anxiety   . Arthritis   . CVA (cerebral infarction)    Old CVA on MRI brain  . Depression   . DVT (deep venous thrombosis) ([place]) 62/26/3335   L basilic vein   . Facet hypertrophy of lumbar region    MRI 2007  . Fibromyalgia   . GERD (gastroesophageal reflux disease)   . Hypertension    no meds now, lost 50lbs  . Migraines   . MS (multiple sclerosis) ([place])   . Neuromuscular disorder ([place])   . Obesity   . PTSD (post-traumatic stress disorder)   . Seizures ([place]) 2011   no seizure since onset  . Sleep apnea    diagnosed 20 ago, lost wt no CPAP now  . Smoking   . Stroke [facility])    doesn't know when   Past Surgical History: Past Surgical History:  Procedure Laterality Date  . CHOLECYSTECTOMY  10/2017  . CHONDROPLASTY Left 12/30/2014   Procedure: CHONDROPLASTY;  Surgeon: Leandrew Koyanagi, MD;  Location: [place];  Service: Orthopedics;  Laterality: Left;  . KNEE ARTHROSCOPY Left 12/30/2014   Procedure: LEFT KNEE ARTHROSCOPY WITH   CHONDROPLASTY;  Surgeon: Leandrew Koyanagi, MD;  Location: [place];  Service: Orthopedics;  Laterality: Left;  . PARTIAL KNEE ARTHROPLASTY Left 08/03/2016   Procedure: LEFT UNICOMPARTMENTAL KNEE ARTHROPLASTY;  Surgeon: Leandrew Koyanagi, MD;  Location: [place];  Service: Orthopedics;  Laterality: Left;  . TONSILLECTOMY    . TUBAL LIGATION  1992   Social History: Social History   Socioeconomic History  . Marital status: Single    Spouse name: Not on file  .  Number of children: Not on file  . Years of education: Not on file  . Highest education level: Not on file  Occupational History  . Not on file  Social Needs  . Financial resource strain: Not on file  . Food insecurity:    Worry: Not on file    Inability: Not on file  . Transportation needs:    Medical: Not on file    Non-medical: Not on file  Tobacco Use  . Smoking status: Current Every Day Smoker    Packs/day: 1.00    Years: 25.00    Pack years: 25.00    Types: Cigarettes  . Smokeless tobacco: Never Used  . Tobacco comment: advised to d/c by 3 cigs per week.  Substance and Sexual Activity  . Alcohol use: No    Alcohol/week: 0.0 standard drinks    Comment: occasional  . Drug use: No  . Sexual activity: Yes    Birth control/protection: None, Post-menopausal  Lifestyle  . Physical activity:    Days per week: Not on file    Minutes per session: Not on file  . Stress: Not on file  Relationships  . Social connections:    Talks on phone: Not on file    Gets together: Not on file    Attends religious service: Not on file    Active member of  club or organization: Not on file    Attends meetings of clubs or organizations: Not on file    Relationship status: Not on file  Other Topics Concern  . Not on file  Social History Narrative  . Not on file   Family History: Family History  Problem Relation Age of Onset  . Cancer Mother 97       melanoma  . Hypertension Sister   . Heart disease Sister        valve disease  . Depression Sister   . Diabetes Sister   . Sjogren's syndrome Sister    Allergies: Allergies  Allergen Reactions  . Ace Inhibitors     REACTION: cough  . Atenolol Other (See Comments)    Bradycardia  . Celexa [Citalopram Hydrobromide] Other (See Comments)    Dizziness Tolerated Lexapro   . Codeine Nausea Only  . Nitroglycerin Other (See Comments)    Drop in BP  . Phenytoin Swelling  . Sertraline Other (See Comments)    unknown  . Sumatriptan  Nausea Only  . Topamax [Topiramate] Other (See Comments)    She reports that this makes her forgetful and causes her to studder  . Triamcinolone     Steroid flare after joint injection, use other steroid for injections   Medications: See med rec.  Review of Systems: No fevers, chills, night sweats, weight loss, chest pain, or shortness of breath.   Objective:    General: Well Developed, well nourished, and in no acute distress.  Neuro: Alert and oriented x3, extra-ocular muscles intact, sensation grossly intact.  HEENT: Normocephalic, atraumatic, pupils equal round reactive to light, neck supple, no masses, no lymphadenopathy, thyroid nonpalpable.  Skin: Warm and dry, no rashes. Cardiac: Regular rate and rhythm, no murmurs rubs or gallops, no lower extremity edema.  Respiratory: Clear to auscultation bilaterally. Not using accessory muscles, speaking in full sentences. Left hip: ROM IR: 60 Deg, ER: 60 Deg, Flexion: 120 Deg, Extension: 100 Deg, Abduction: 45 Deg, Adduction: 45 Deg Strength IR: 5/5, ER: 5/5, Flexion: 5/5, Extension: 5/5, Abduction: 5/5, Adduction: 5/5 Pelvic alignment unremarkable to inspection and palpation. Standing hip rotation and gait without trendelenburg / unsteadiness. Greater trochanter with severe tenderness to palpation. There is also some reproduction of pain with internal rotation of hip No tenderness over piriformis. No SI joint tenderness and normal minimal SI movement.  Procedure: Real-time Ultrasound Guided injection of the left greater trochanteric bursa Device: GE Logiq E  Verbal informed consent obtained.  Time-out conducted.  Noted no overlying erythema, induration, or other signs of local infection.  Skin prepped in a sterile fashion.  Local anesthesia: Topical Ethyl chloride.  With sterile technique and under real time ultrasound guidance:  1 cc Kenalog 40, 2 cc lidocaine, 2 cc bupivacaine injected easily Completed without difficulty  Pain  immediately resolved suggesting accurate placement of the medication.  Advised to call if fevers/chills, erythema, induration, drainage, or persistent bleeding.  Images permanently stored and available for review in the ultrasound unit.  Impression: Technically successful ultrasound guided injection.  Impression and Recommendations:    Trochanteric bursitis of left hip Previous injection was approximately 3 years ago, repeat trochanteric bursa injection on the left, x-rays. If persistent pain we will proceed with a hip joint injection, some of the pain was referrable to the femoroacetabular joint. Return as needed.   ___________________________________________ Gwen Her. Dianah Field, M.D., ABFM., CAQSM. Primary Care and Sports Medicine [facility]  Adjunct Professor of [facility] Medicine  [employer]

## 2018-08-02 ENCOUNTER — Encounter: Payer: Self-pay | Admitting: Sports Medicine

## 2018-08-05 ENCOUNTER — Other Ambulatory Visit: Payer: Self-pay | Admitting: *Deleted

## 2018-08-05 ENCOUNTER — Ambulatory Visit (INDEPENDENT_AMBULATORY_CARE_PROVIDER_SITE_OTHER): Payer: Medicare Other | Admitting: Sports Medicine

## 2018-08-05 ENCOUNTER — Encounter: Payer: Self-pay | Admitting: Family Medicine

## 2018-08-05 ENCOUNTER — Other Ambulatory Visit: Payer: Self-pay

## 2018-08-05 ENCOUNTER — Ambulatory Visit (INDEPENDENT_AMBULATORY_CARE_PROVIDER_SITE_OTHER): Payer: Medicare Other | Admitting: Licensed Clinical Social Worker

## 2018-08-05 DIAGNOSIS — Z6841 Body Mass Index (BMI) 40.0 and over, adult: Principal | ICD-10-CM

## 2018-08-05 DIAGNOSIS — F411 Generalized anxiety disorder: Secondary | ICD-10-CM

## 2018-08-05 DIAGNOSIS — F4310 Post-traumatic stress disorder, unspecified: Secondary | ICD-10-CM | POA: Diagnosis not present

## 2018-08-05 DIAGNOSIS — F4321 Adjustment disorder with depressed mood: Secondary | ICD-10-CM

## 2018-08-05 DIAGNOSIS — Z634 Disappearance and death of family member: Secondary | ICD-10-CM

## 2018-08-05 DIAGNOSIS — F4329 Adjustment disorder with other symptoms: Secondary | ICD-10-CM | POA: Diagnosis not present

## 2018-08-05 DIAGNOSIS — M1612 Unilateral primary osteoarthritis, left hip: Secondary | ICD-10-CM

## 2018-08-05 DIAGNOSIS — G894 Chronic pain syndrome: Secondary | ICD-10-CM

## 2018-08-05 DIAGNOSIS — M160 Bilateral primary osteoarthritis of hip: Secondary | ICD-10-CM | POA: Insufficient documentation
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
## 2018-08-05 NOTE — Progress Notes (Signed)
   Procedure: Real-time Ultrasound Guided injection of the left femoroacetabular joint Device: GE Logiq E  Verbal informed consent obtained.  Time-out conducted.  Noted no overlying erythema, induration, or other signs of local infection.  Skin prepped in a sterile fashion.  Local anesthesia: Topical Ethyl chloride.  With sterile technique and under real time ultrasound guidance:  22-gauge spinal needle advanced to the femoral head/neck junction, contacted bone and then injected 1 cc Kenalog 40, 2 cc lidocaine, 2 cc bupivacaine Completed without difficulty  Pain immediately resolved suggesting accurate placement of the medication.  Advised to call if fevers/chills, erythema, induration, drainage, or persistent bleeding.  Images permanently stored and available for review in the ultrasound unit.  Impression: Technically successful ultrasound guided injection.

## 2018-08-05 NOTE — Assessment & Plan Note (Signed)
Unfortunately had persistent pain after trochanteric bursa injection at the last visit. Her lateral pain is better but she has pain in the groin now, hip joint injection today. She needs to lose about 100 lbs, I have asked her to discuss this with her PCP, most likely bariatric surgery is needed here. This kind of weight loss would probably eliminate all of her orthopedic problems.

## 2018-08-07 ENCOUNTER — Encounter: Payer: Self-pay | Admitting: Family Medicine

## 2018-08-08 ENCOUNTER — Other Ambulatory Visit: Payer: Self-pay

## 2018-08-08 ENCOUNTER — Ambulatory Visit (INDEPENDENT_AMBULATORY_CARE_PROVIDER_SITE_OTHER): Payer: Medicare Other | Admitting: Psychiatry

## 2018-08-08 ENCOUNTER — Encounter (HOSPITAL_COMMUNITY): Payer: Self-pay | Admitting: Psychiatry

## 2018-08-08 DIAGNOSIS — F331 Major depressive disorder, recurrent, moderate: Secondary | ICD-10-CM | POA: Diagnosis not present

## 2018-08-08 DIAGNOSIS — F4310 Post-traumatic stress disorder, unspecified: Secondary | ICD-10-CM | POA: Diagnosis not present

## 2018-08-08 DIAGNOSIS — F411 Generalized anxiety disorder: Secondary | ICD-10-CM | POA: Diagnosis not present

## 2018-08-08 DIAGNOSIS — Z79899 Other long term (current) drug therapy: Secondary | ICD-10-CM

## 2018-08-08 DIAGNOSIS — G894 Chronic pain syndrome: Secondary | ICD-10-CM | POA: Diagnosis not present

## 2018-08-08 MED ORDER — BUSPIRONE HCL 7.5 MG PO TABS: 7.5000 mg | ORAL_TABLET | Freq: Every day | ORAL | 0 refills | Status: DC

## 2018-08-08 MED ORDER — DULOXETINE HCL 30 MG PO CPEP: 30.0000 mg | ORAL_CAPSULE | Freq: Every day | ORAL | 1 refills | Status: DC

## 2018-08-08 MED ORDER — DULOXETINE HCL 60 MG PO CPEP: 60.0000 mg | ORAL_CAPSULE | Freq: Every day | ORAL | 0 refills | Status: DC
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
## 2018-08-12 ENCOUNTER — Encounter: Payer: Self-pay | Admitting: Family Medicine

## 2018-08-13 ENCOUNTER — Encounter: Payer: Self-pay | Admitting: Family Medicine

## 2018-08-16 DIAGNOSIS — M545 Low back pain: Secondary | ICD-10-CM | POA: Diagnosis not present

## 2018-08-16 DIAGNOSIS — G43909 Migraine, unspecified, not intractable, without status migrainosus: Secondary | ICD-10-CM | POA: Diagnosis not present

## 2018-08-16 DIAGNOSIS — G8929 Other chronic pain: Secondary | ICD-10-CM | POA: Diagnosis not present

## 2018-08-16 DIAGNOSIS — Z79899 Other long term (current) drug therapy: Secondary | ICD-10-CM | POA: Diagnosis not present

## 2018-08-19 ENCOUNTER — Ambulatory Visit (INDEPENDENT_AMBULATORY_CARE_PROVIDER_SITE_OTHER): Payer: Medicare Other | Admitting: Licensed Clinical Social Worker

## 2018-08-19 ENCOUNTER — Other Ambulatory Visit: Payer: Self-pay | Admitting: Sports Medicine

## 2018-08-19 DIAGNOSIS — F4329 Adjustment disorder with other symptoms: Secondary | ICD-10-CM | POA: Diagnosis not present

## 2018-08-19 DIAGNOSIS — G894 Chronic pain syndrome: Secondary | ICD-10-CM | POA: Diagnosis not present

## 2018-08-19 DIAGNOSIS — Z634 Disappearance and death of family member: Secondary | ICD-10-CM

## 2018-08-19 DIAGNOSIS — F4310 Post-traumatic stress disorder, unspecified: Secondary | ICD-10-CM

## 2018-08-19 DIAGNOSIS — F411 Generalized anxiety disorder: Secondary | ICD-10-CM

## 2018-08-19 DIAGNOSIS — F4321 Adjustment disorder with depressed mood: Secondary | ICD-10-CM

## 2018-08-19 DIAGNOSIS — M5136 Other intervertebral disc degeneration, lumbar region: Secondary | ICD-10-CM

## 2018-08-19 NOTE — Progress Notes (Signed)
Virtual Visit via Telephone Note  I connected with EMANUELLA Gomez on 08/19/18 at 11:00 AM EDT by telephone and verified that I am speaking with the correct person using two identifiers.   I discussed the limitations, risks, security and privacy concerns of performing an evaluation and management service by telephone and the availability of in person appointments. I also discussed with the patient that there may be a patient responsible charge related to this service. The patient expressed understanding and agreed to proceed.     Cordella Register, LCSW   THERAPIST PROGRESS NOTE  Session Time: 11:06 AM to 11:58 AM  Participation Level: Active  Behavioral Response: CasualAlertAnxious  Type of Therapy: Individual Therapy  Treatment Goals addressed: Patient work on Radiographer, therapeutic for stress management, decrease in anxiety and depression, coping  Interventions: CBT, Solution Focused, Strength-based, Supportive and Other: process of grief, coping  Summary: Ashley Gomez is a 48 y.o. female who presents with anxiety up and allergies but otherwise ok. Shares one of difficulties is not being able to go anywhere. Thought about working on photo album for grand kids and went through box and found birth certificate, pictures when she was young. Shares she is not ready for that right now, perhaps in a few days or next week. Describes feeling worse lately about  [name], the daughter she lost. Has thoughts such as could have done more, bothers her that not at hospital. Discussed patient coming to recognize that she can't control events outside of herself (see below) Patient shares this is a problem for her, even though she knows this is the case. Also discussed how current events can be trigger for trauma.  Explored ways she could keep busy. Talked about how  visiting sister would take her out of regular routine but doesn't like being on main highways although sister could visit her. Shares update of  sister that her valve is fine, there are small changes, has a check up in either 6 or 12 months. Therapist pointed out that this is a relief of a stressor.  Shares scared of getting the virus and people dying. Never has been like this before where the world is sick. Scared of kids and grand kids getting sick. Afraid about not getting items such as toilet paper and Lysol, of not being able to see providers when she is supposed. She is cleaning, washing everything using Lysol and bleach. Her allergies caused her to worry about whether she had virus, but checked in with doctor and he assured her that it was allergies.  Reviewed update as to meds, Dr Albertine Patricia   Dr. De Nurse plans to take her off of Klonopin and she is ok with that, understands reasoning that is just a rescue. He added Buspar due to current symptoms, increased her depression med Cymbalta.   Shares now heard Tiger got virus that he support animals could get it. Worry that news is saying next two weeks are critical. Shares she tries not to worry, tell herself can't do it all myself. Relates that control is an issue for things, she recognizes she wants to have control of things and take care of people. Tells herself that she should do this, that it is her responsibility even if she can't. Tells herself that she can't control beyond what she is doing, doing everything she can do, not all her responsibility, but doesn't stop her worry about it. Therapist encouraged her with positive and realistic self-talk; "I am doing all I can". Therapist  pointed out that doing things excessively will make the stress worse and in approaching things in a healthy way she needs balance.  Reviewed session and patient relates will continue to tell herself that she can't control what is going on, not her fault, hoping she does everything she needs, doing what she can do, and reminding herself that she is doing all she can. Pointed out to focus on what she can do and then let go  of unhelpful worry.    Therapist assessed patient current functioning per report and processed feelings related to stressors.  Discussed to address anxiety and stressed to first validate these feelings as they are normal reactions to what is going on, related to current situation activates fighter flight mode so finding ways to unplug from news, finding activities to deactivate the fighter flight reviewing activities that would work for patient Reviewed filling up time with activities, could be a good time to start a project.  Think she will feel good about when she started. Provided education and explaining how human beings have amazing ability to think about future events.  Thinking ahead means that we can anticipate obstacles or problems, gives [name] the opportunity to plan solutions.  When it helps [name] to achieve her goals, thinking ahead can be helpful.  For example handwashing and social distancing are helpful things that we can decide to do in order to prevent the spread of virus.  However worrying is a way of thinking ahead that often leaves [name] feeling anxious or apprehensive.  When we worry excessively we often think about worst-case scenarios and feel that we will be able to cope.  In session pointed out statements of patient which indicate negative forecasting. Describe how where he feels like a chain of thoughts and images which can progress and increasingly catastrophic and unlikely directions.  Reviewed how this happens for patient.  Introduced worksheet titled "decision tree to help you noticed real problem versus hypothetical worry.  Exercise starts with asking what you are worried about and then asking is there is a problem I can do something about, if you can work out which you could do, list options and review which you can do right now, do it and then let go worry and focus on something else that is important to right now.  If nothing she can do about it them let go of worry and focus on  something else.  Encouraged patient to use this with current anxiety related to current situation. Discussed that worry thoughts can be unproductive and helpful, so ask oneself "what is the point" if not helpful. Reviewed challenging thoughts for truthfulness. Provided positive feedback for patient checking with her doctor. Careful to examine if thought is based on facts.  Reviewed that it makes sense to implement common sense precautions. But challenged patient feeling she has complete control of things. Identify this as an issue for patient.  Expressed idea and exaggerated terms by relating were not all powerful and have limited control, only control how we react to things.  Related this to grief issue.  Discussed that there is a illusinary sense that we could have done something or changed outcome. Not holding on to self-blame helping to alleviate depression, helps in healing process Provided strength based and supportive interventions.  Suicidal/Homicidal: No  Plan: Return again in 2 weeks.2.  Therapist continue to work with patient on strategies to help with decrease in anxiety depression, processing of grief  Diagnosis: Axis I: generalized anxiety disorder  PTSD, complicated grief, chronic pain syndrome, Major depressive disorder, recurrent moderate    Axis II: No diagnosis  Follow Up Instructions:    I discussed the assessment and treatment plan with the patient. The patient was provided an opportunity to ask questions and all were answered. The patient agreed with the plan and demonstrated an understanding of the instructions.   The patient was advised to call back or seek an in-person evaluation if the symptoms worsen or if the condition fails to improve as anticipated.  I provided 52 minutes of non-face-to-face time during this encounter.   Cordella Register, LCSW 08/19/2018

## 2018-08-21 ENCOUNTER — Encounter: Payer: Self-pay | Admitting: Family Medicine

## 2018-08-21 NOTE — Telephone Encounter (Signed)
Mailed labs.

## 2018-08-27 ENCOUNTER — Encounter: Payer: Self-pay | Admitting: Family Medicine

## 2018-08-29 ENCOUNTER — Encounter: Payer: Self-pay | Admitting: Family Medicine

## 2018-08-29 MED ORDER — FAMOTIDINE 20 MG PO TABS: 20.0000 mg | ORAL_TABLET | Freq: Every day | ORAL | 3 refills | Status: DC

## 2018-08-30 ENCOUNTER — Other Ambulatory Visit (HOSPITAL_COMMUNITY): Payer: Self-pay | Admitting: Psychiatry

## 2018-08-31 ENCOUNTER — Other Ambulatory Visit: Payer: Self-pay | Admitting: Family Medicine

## 2018-09-02 MED ORDER — ERENUMAB-AOOE 140 MG/ML ~~LOC~~ SOAJ: 140.0000 mg | SUBCUTANEOUS | 3 refills | Status: DC

## 2018-09-02 NOTE — Telephone Encounter (Signed)
New rx sent for higer dose of Aimovig.

## 2018-09-04 ENCOUNTER — Ambulatory Visit (HOSPITAL_COMMUNITY): Payer: Medicare Other | Admitting: Licensed Clinical Social Worker

## 2018-09-04 ENCOUNTER — Ambulatory Visit: Payer: Medicare Other | Admitting: Sports Medicine

## 2018-09-05 ENCOUNTER — Ambulatory Visit (INDEPENDENT_AMBULATORY_CARE_PROVIDER_SITE_OTHER): Payer: Medicare Other | Admitting: Licensed Clinical Social Worker

## 2018-09-05 ENCOUNTER — Ambulatory Visit: Payer: Self-pay | Admitting: Sports Medicine

## 2018-09-05 ENCOUNTER — Other Ambulatory Visit: Payer: Self-pay

## 2018-09-05 DIAGNOSIS — G894 Chronic pain syndrome: Secondary | ICD-10-CM

## 2018-09-05 DIAGNOSIS — F4310 Post-traumatic stress disorder, unspecified: Secondary | ICD-10-CM

## 2018-09-05 DIAGNOSIS — F331 Major depressive disorder, recurrent, moderate: Secondary | ICD-10-CM

## 2018-09-05 DIAGNOSIS — F4329 Adjustment disorder with other symptoms: Secondary | ICD-10-CM | POA: Diagnosis not present

## 2018-09-05 DIAGNOSIS — F4321 Adjustment disorder with depressed mood: Secondary | ICD-10-CM

## 2018-09-05 DIAGNOSIS — Z634 Disappearance and death of family member: Secondary | ICD-10-CM

## 2018-09-05 DIAGNOSIS — F411 Generalized anxiety disorder: Secondary | ICD-10-CM | POA: Diagnosis not present

## 2018-09-05 DIAGNOSIS — F4381 Prolonged grief disorder: Secondary | ICD-10-CM

## 2018-09-05 NOTE — Progress Notes (Signed)
Virtual Visit via Telephone Note  I connected with Ashley Gomez on 09/05/18 at 11:00 AM EDT by telephone and verified that I am speaking with the correct person using two identifiers.   I discussed the limitations, risks, security and privacy concerns of performing an evaluation and management service by telephone and the availability of in person appointments. I also discussed with the patient that there may be a patient responsible charge related to this service. The patient expressed understanding and agreed to proceed.  Follow Up Instructions:    I discussed the assessment and treatment plan with the patient. The patient was provided an opportunity to ask questions and all were answered. The patient agreed with the plan and demonstrated an understanding of the instructions.   The patient was advised to call back or seek an in-person evaluation if the symptoms worsen or if the condition fails to improve as anticipated.  I provided 52 minutes of non-face-to-face time during this encounter.   THERAPIST PROGRESS NOTE  Session Time: 11:02 AM to 11:54 AM  Participation Level: Active  Behavioral Response: CasualAlertAnxious  Type of Therapy: Individual Therapy  Treatment Goals addressed:  Patient work on Radiographer, therapeutic for stress management, decrease in anxiety and depression, coping  Interventions: Solution Focused, Strength-based, Supportive, Reframing and Other: coping skills for hyperarousal, stress management  Summary: Ashley Gomez is a 48 y.o. female who presents with so stressed a lot of the time. Describes mood as "same as always".  One of her main stressors is living with son's girlfriend, Ashley Gomez. She gets upset when asks questions certain things about her pregnancy and she reports symptoms but patient doesn't think that they are happening to her. Thinks that she seeks attention.  When her son comes home, she goes to bedroom like she has been doing things all day when the  truth is that she and hasn't done anything all day. Doesn't get up with baby, baby takes two many naps, doesn't dress her kids every day. Shares it is little things that get to her and that when patient was a mom with young children she wouldn't approve of. Knows everybody is different as mom, but doesn't give them any care the way she needs to. Son works all day, he comes home and even though she has been there all day she doesn't help him with things like cleaning his clothes. It is all expected from patient, making the dinner, cleaning, doing the laundry. Wants to help and doesn't bother her to help but he is the grandmother and she shouldn't be taken over all the responsibilities. This is what frustrates her. Trying not to say something because it will start something. Always something that Ashley Gomez is complaining about, takes a poor me attitude especially when son comes home, wants attention.  Little things that hurt patient's feelings or makes her mad. Worries about the new child coming. On the one hand it keeps her mind off of daughter Ashley Gomez and Ashley Gomez (daughter was pregnant) but when alone thinks about everything and how it would be with them still here.  Stressful and doesn't help her headaches or sleeping. She has to change because patient is stressed by all of this and has worries, should know more at her age, needs to do this for herself Ashley Gomez) .  She can't hear fire trucks, ambulances and put her at crash site even though she wasn't there, can't breath, and anxiety and feels going crazy.   Agrees she is like a time  bomb about to explode, and walking on eggshells. She controls her anger better but irritable because she can't say what wants to say or needs to be said. Worries that because has to make sure she says things the right way so doesn't escalate. Feels like walking on egg shells and shouldn't in her own home.  Reviewed session and patient shares that she learned more coping skills,  things to try to do and distance herself, take time for herself as much as she can.   Suicidal/Homicidal: No  Therapist Response: Assess patient current functioning per report and processed patient's feelings related to current stressors.  Engaged and coping strategies related to stress of son's girlfriend who she lives with. Encouraged setting emotional boundaries as much as possible given that they live in same house. Talked about looking into parenting classes for Ashley Gomez, son's girlfriend, as she is need of learning these skills. Also talk to her to develop insight that good parenting is important for her children and pivotal to their development and learning skills they need for functioning in life effectively.  Reframed with patient pointing out that it is good she is there right now with [name]'s kids to help with parenting so kids do not miss essential things they need for their well-being.  Identified hyper arousal symptoms from trauma and use metaphors that may help her identify her feelings.  Patient able to identify with metaphor such as your over the edge, you are working on eggshells.  Provided education on grounding to help with the symptoms.  Explained that grounding is a distraction that works by focusing outward on the external world rather than inward toward the self.  Explained when overwhelmed with emotional pain also you need a way to detach so that you can gain control of your feelings and feels safe.  Explained pain is a feeling, it is not who you are.  Shared "no feeling is final".  Shared guidelines for grounding including that can be done at any time, any place, anywhere, use when faced with a trigger, keep your eyes open, scanned the room and turn the light on to stay in touch with the present.  Rate your mood before and after grounding to test whether it works.  Reviewed types of grounding including mental grounding such as describing your environment in detail, saying a safety  statement.  Reviewed physical grounding such as running cold or warm water over her hands, carrying object around in her pocket, and soothing grounding such as same kind statements, thinking of favorites, remembering a safe place. Provided strength based and supportive intervention  Plan: Return again in 2 weeks.2.  Patient provided with resources to look at website psychology today as well as family services to look for parenting classes for Brandi. 3.  Patient practice exercises to help with hyperarousal symptoms. 4.  Therapist continue to work with patient on stress management, emotional regulation skills  Diagnosis: Axis I:  generalized anxiety disorder PTSD, complicated grief, chronic pain syndrome, Major depressive disorder, recurrent moderate    Axis II: No diagnosis    Cordella Register, LCSW 09/05/2018

## 2018-09-12 ENCOUNTER — Encounter: Payer: Self-pay | Admitting: Family Medicine

## 2018-09-12 DIAGNOSIS — Z79891 Long term (current) use of opiate analgesic: Secondary | ICD-10-CM | POA: Diagnosis not present

## 2018-09-12 DIAGNOSIS — G8929 Other chronic pain: Secondary | ICD-10-CM | POA: Diagnosis not present

## 2018-09-12 DIAGNOSIS — M545 Low back pain: Secondary | ICD-10-CM | POA: Diagnosis not present

## 2018-09-12 DIAGNOSIS — Z79899 Other long term (current) drug therapy: Secondary | ICD-10-CM | POA: Diagnosis not present

## 2018-09-18 ENCOUNTER — Ambulatory Visit (INDEPENDENT_AMBULATORY_CARE_PROVIDER_SITE_OTHER): Payer: Medicare Other | Admitting: Licensed Clinical Social Worker

## 2018-09-18 DIAGNOSIS — G43709 Chronic migraine without aura, not intractable, without status migrainosus: Secondary | ICD-10-CM | POA: Diagnosis not present

## 2018-09-18 DIAGNOSIS — G35 Multiple sclerosis: Secondary | ICD-10-CM | POA: Diagnosis not present

## 2018-09-18 DIAGNOSIS — F4310 Post-traumatic stress disorder, unspecified: Secondary | ICD-10-CM | POA: Diagnosis not present

## 2018-09-18 DIAGNOSIS — F4321 Adjustment disorder with depressed mood: Secondary | ICD-10-CM

## 2018-09-18 DIAGNOSIS — F4329 Adjustment disorder with other symptoms: Secondary | ICD-10-CM | POA: Diagnosis not present

## 2018-09-18 DIAGNOSIS — F411 Generalized anxiety disorder: Secondary | ICD-10-CM | POA: Diagnosis not present

## 2018-09-18 DIAGNOSIS — G894 Chronic pain syndrome: Secondary | ICD-10-CM

## 2018-09-18 DIAGNOSIS — R569 Unspecified convulsions: Secondary | ICD-10-CM | POA: Diagnosis not present

## 2018-09-18 DIAGNOSIS — F4381 Prolonged grief disorder: Secondary | ICD-10-CM

## 2018-09-18 DIAGNOSIS — Z634 Disappearance and death of family member: Secondary | ICD-10-CM

## 2018-09-18 DIAGNOSIS — F331 Major depressive disorder, recurrent, moderate: Secondary | ICD-10-CM

## 2018-09-18 NOTE — Progress Notes (Signed)
Virtual Visit via Telephone Note  I connected with Ashley Gomez on 09/18/18 at  3:00 PM EDT by telephone and verified that I am speaking with the correct person using two identifiers.   I discussed the limitations, risks, security and privacy concerns of performing an evaluation and management service by telephone and the availability of in person appointments. I also discussed with the patient that there may be a patient responsible charge related to this service. The patient expressed understanding and agreed to proceed.   THERAPIST PROGRESS NOTE  Session Time: 3:00 PM to 3:52 PM  Participation Level: Active  Behavioral Response: CasualAlertfrustrated  Type of Therapy: Individual Therapy  Treatment Goals addressed: Patient work on Radiographer, therapeutic for stress management, decrease in anxiety and depression, coping  Interventions: Solution Focused, Strength-based, Supportive and Other: stress management  Summary: Ashley Gomez is a 48 y.o. female who presents with just coming back from  appointment with new neurologist. She has waited 5 months for the appointment, told them were to get records, and still did not have records when she showed up for appointment.  If there have been any problems she wished they had told her before hand.  She is to wait another 4 months as it is hard to get appointment and they have to send for records. So mad when she left.  Expects it to be simple and easy, tell her what is going on and what to do.instead feels as if she has to call everybody, they are not doing the job and she has to do it, describes that it is "unreal".  Now still has no medicine for pain, muscle spasms.  Relates she thinks that pain clinic could reduce pain medication if her MS was appropriately addressed.  Shares due to moving, having gallbladder surgery last year, biters to left practice she has not had medications for MS in a year.   Shares she has good news, that found out last week that  she is going to have a grandchild that is going to be a girl, especially glad with the news that it will be a girl.  Reviewed how much she is involved with her grandchildren's life and the new grandchild will be her seventh.  Usually sees grandchildren to 3 times a week at important part of her life. Reviewed stressor of daughter-in-law, [name] not quite as bad, it is her distance which helps still a lot and shares "cannot discuss it right now" there are things she is done that she does not understand and hopeful will change possibly with new child.  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    Visited sister over weekend, they talked about the real dad.  Her sister wants to see him but patient does not.  He has had twins and has not made effort to contact them in 50 years and she does not understand it.  She lost daughter and grandson and would do anything to have them back, cannot understand why he has been bothered with them for 50 years.  Feels as apparent he has the responsibility to make an effort to build a relationship, she does not need to make the effort and she is not the one responsible to do this.  Discussed history with mom.  Patient worries about her kids and their families and mom has no time to worry about how she feels and for which she is done in  her life.  Shares "mom has done so much in the lives".  Describes neglect, describes 1 specific experiences of leaving at a apartment without food calling grandparents. Feels that mom never wanted them. She wouldn't the person she is today without aunts and grandparents.  Suicidal/Homicidal: No  Therapist Response: Therapist assessed patient current functioning per report and processed feelings related to recent stressors.  Validated patient on how she was feeling and worked on perspective talking helping her recognize many people experienced frustration and breakdown in services with  providers, not doing things they need to do on their part.  Frustration of feeling in order to get help having to do a lot of the work yourself.  Verbalized patient's experience to help her with coping. Focused on positive news to help with mood, focusing on positive things happening in her life, looking forward to having a granddaughter that is a girl.  Explored in more detail history of her relationship with mom, mom's neglect in seeing impact on patient's mental health symptoms.  Assessed patient as and an adult motional boundaries with both parents, provided positive feedback for focusing on her own family.  Also positive feedback for patient being in a large part responsible for a positive quality she has such as the care that she is given to her family.  Pointing this out as strength as coming from a background of being neglected by her parents. Assess patient processing through grief when talking about how other parents including her own have not part of their kids life when she do anything to have her child and grandchild here.  As patient has a significant role in her family's life will help her to see that she has internalized her and stays connected to her through this process of grief. Provided supportive intervention.    Plan: Return again in 4 weeks.2.  Therapist continue to work with patient on processing through grief, trauma symptoms, stress management, mood regulation skills  Diagnosis: Axis I: generalized anxiety disorder PTSD, complicated grief, chronic pain syndrome, Major depressive disorder, recurrent moderate    Axis II: No diagnosis  Follow Up Instructions:    I discussed the assessment and treatment plan with the patient. The patient was provided an opportunity to ask questions and all were answered. The patient agreed with the plan and demonstrated an understanding of the instructions.   The patient was advised to call back or seek an in-person evaluation if the symptoms  worsen or if the condition fails to improve as anticipated.  I provided 52 minutes of non-face-to-face time during this encounter.   Cordella Register, LCSW 09/18/2018

## 2018-09-19 ENCOUNTER — Encounter: Payer: Self-pay | Admitting: Family Medicine

## 2018-09-19 DIAGNOSIS — R102 Pelvic and perineal pain: Secondary | ICD-10-CM

## 2018-09-19 NOTE — Telephone Encounter (Signed)
Referral placed per PCP request 

## 2018-09-20 ENCOUNTER — Telehealth: Payer: Self-pay

## 2018-09-20 NOTE — Telephone Encounter (Signed)
Ok, lets get her on my schedule next week for in office evaluation and pelvic exam and then we can schedule US>

## 2018-09-20 NOTE — Telephone Encounter (Signed)
Patient scheduled.

## 2018-09-20 NOTE — Telephone Encounter (Signed)
Ashley Gomez states before she can be seen at [facility] she has to have a transvaginal ultra sound. She wanted to know if Dr Madilyn Fireman will order.

## 2018-09-23 ENCOUNTER — Ambulatory Visit: Payer: Medicare Other | Admitting: Family Medicine

## 2018-09-23 NOTE — Progress Notes (Deleted)
Acute Office Visit  Subjective:    Patient ID: Ashley Gomez, female    DOB: [birth year], 48 y.o.   MRN: [id number]  No chief complaint on file.   HPI Patient is in today for pelvic pain.  She complains of a little over 2 weeks of left lower quadrant abdominal pain.  She has a history of ovarian cysts and thought that could be the cause.  Past Medical History:  Diagnosis Date  . Anxiety   . Arthritis   . CVA (cerebral infarction)    Old CVA on MRI brain  . Depression   . DVT (deep venous thrombosis) ([place]) 67/34/1937   L basilic vein   . Facet hypertrophy of lumbar region    MRI 2007  . Fibromyalgia   . GERD (gastroesophageal reflux disease)   . Hypertension    no meds now, lost 50lbs  . Migraines   . MS (multiple sclerosis) ([place])   . Neuromuscular disorder ([place])   . Obesity   . PTSD (post-traumatic stress disorder)   . Seizures ([place]) 2011   no seizure since onset  . Sleep apnea    diagnosed 20 ago, lost wt no CPAP now  . Smoking   . Stroke [facility])    doesn't know when    Past Surgical History:  Procedure Laterality Date  . CHOLECYSTECTOMY  10/2017  . CHONDROPLASTY Left 12/30/2014   Procedure: CHONDROPLASTY;  Surgeon: Leandrew Koyanagi, MD;  Location: [place];  Service: Orthopedics;  Laterality: Left;  . KNEE ARTHROSCOPY Left 12/30/2014   Procedure: LEFT KNEE ARTHROSCOPY WITH   CHONDROPLASTY;  Surgeon: Leandrew Koyanagi, MD;  Location: [place];  Service: Orthopedics;  Laterality: Left;  . PARTIAL KNEE ARTHROPLASTY Left 08/03/2016   Procedure: LEFT UNICOMPARTMENTAL KNEE ARTHROPLASTY;  Surgeon: Leandrew Koyanagi, MD;  Location: [place];  Service: Orthopedics;  Laterality: Left;  . TONSILLECTOMY    . TUBAL LIGATION  1992    Family History  Problem Relation Age of Onset  . Cancer Mother 57       melanoma  . Hypertension Sister   . Heart disease Sister        valve disease  . Depression Sister   . Diabetes Sister   . Sjogren's syndrome Sister      Social History   Socioeconomic History  . Marital status: Single    Spouse name: Not on file  . Number of children: Not on file  . Years of education: Not on file  . Highest education level: Not on file  Occupational History  . Not on file  Social Needs  . Financial resource strain: Not on file  . Food insecurity:    Worry: Not on file    Inability: Not on file  . Transportation needs:    Medical: Not on file    Non-medical: Not on file  Tobacco Use  . Smoking status: Current Every Day Smoker    Packs/day: 1.00    Years: 25.00    Pack years: 25.00    Types: Cigarettes  . Smokeless tobacco: Never Used  . Tobacco comment: advised to d/c by 3 cigs per week.  Substance and Sexual Activity  . Alcohol use: No    Alcohol/week: 0.0 standard drinks    Comment: occasional  . Drug use: No  . Sexual activity: Yes    Birth control/protection: None, Post-menopausal  Lifestyle  . Physical activity:    Days per week: Not on  file    Minutes per session: Not on file  . Stress: Not on file  Relationships  . Social connections:    Talks on phone: Not on file    Gets together: Not on file    Attends religious service: Not on file    Active member of club or organization: Not on file    Attends meetings of clubs or organizations: Not on file    Relationship status: Not on file  . Intimate partner violence:    Fear of current or ex partner: Not on file    Emotionally abused: Not on file    Physically abused: Not on file    Forced sexual activity: Not on file  Other Topics Concern  . Not on file  Social History Narrative  . Not on file    Outpatient Medications Prior to Visit  Medication Sig Dispense Refill  . acetaminophen (TYLENOL) 500 MG tablet Take 1,000 mg by mouth every 6 (six) hours as needed for mild pain or headache.    Marland Kitchen azelastine (ASTELIN) 0.1 % nasal spray Place 2 sprays into both nostrils 2 (two) times daily. Use in each nostril as directed 30 mL 12  .  busPIRone (BUSPAR) 7.5 MG tablet TAKE 1 TABLET BY MOUTH EVERY DAY 90 tablet 1  . celecoxib (CELEBREX) 200 MG capsule TAKE 2 CAPSULES BY MOUTH AS NEEDED FOR PAIN 180 capsule 0  . [START ON 10/14/2018] clonazePAM (KLONOPIN) 1 MG tablet Take 0.5-1 tablets (0.5-1 mg total) by mouth 2 (two) times daily as needed for anxiety. This is 30 days supply. We are tapering since on opioids 50 tablet 1  . diclofenac sodium (VOLTAREN) 1 % GEL See admin instructions.    . DULoxetine (CYMBALTA) 30 MG capsule Take 1 capsule (30 mg total) by mouth daily. This is in addition to 60mg  taking daily. Total dose be 90 mg 30 capsule 1  . DULoxetine (CYMBALTA) 60 MG capsule Take 1 capsule (60 mg total) by mouth daily. 30 capsule 0  . eletriptan (RELPAX) 40 MG tablet Take 1 tablet (40 mg total) by mouth as needed for migraine or headache. May repeat in 2 hours if headache persists or recurs. 10 tablet 0  . Erenumab-aooe (AIMOVIG) 140 MG/ML SOAJ Inject 140 mg into the skin every 30 (thirty) days. 1 pen 3  . famotidine (PEPCID) 20 MG tablet Take 1 tablet (20 mg total) by mouth daily. 90 tablet 3  . HYDROcodone-acetaminophen (NORCO) 10-325 MG tablet Take 1 tablet by mouth every 6 (six) hours as needed for pain. for pain  0  . omeprazole (PRILOSEC) 40 MG capsule Take 1 capsule (40 mg total) by mouth daily. 90 capsule 3  . ondansetron (ZOFRAN) 4 MG tablet take 1 to 2 tablet by mouth every 8 hours if needed for nausea and vomiting 30 tablet 1  . traZODone (DESYREL) 50 MG tablet Take 0.5-2 tablets (25-100 mg total) by mouth at bedtime as needed for sleep. 45 tablet 3  . Triamcinolone Acetonide (TRIAMCINOLONE 0.1 % CREAM : EUCERIN) CREA Apply 1 application topically at bedtime. 1 Jar. 1 each 1  . Vitamin D, Ergocalciferol, (DRISDOL) 1.25 MG (50000 UT) CAPS capsule Take 1 capsule (50,000 Units total) by mouth every 7 (seven) days. 12 capsule 0   No facility-administered medications prior to visit.     Allergies  Allergen Reactions  .  Ace Inhibitors     REACTION: cough  . Atenolol Other (See Comments)    Bradycardia  .  Celexa [Citalopram Hydrobromide] Other (See Comments)    Dizziness Tolerated Lexapro   . Codeine Nausea Only  . Nitroglycerin Other (See Comments)    Drop in BP  . Phenytoin Swelling  . Sertraline Other (See Comments)    unknown  . Sumatriptan Nausea Only  . Topamax [Topiramate] Other (See Comments)    She reports that this makes her forgetful and causes her to studder  . Triamcinolone     Steroid flare after joint injection, use other steroid for injections    ROS     Objective:    Physical Exam  LMP 12/28/2014  Wt Readings from Last 3 Encounters:  07/30/18 265 lb (120.2 kg)  07/26/18 266 lb (120.7 kg)  07/10/18 259 lb (117.5 kg)    There are no preventive care reminders to display for this patient.  There are no preventive care reminders to display for this patient.   Lab Results  Component Value Date   TSH 0.53 02/15/2018   Lab Results  Component Value Date   WBC 8.9 07/22/2018   HGB 15.1 06/21/2018   HCT 44 (A) 07/22/2018   MCV 91 07/22/2018   PLT 244 07/22/2018   Lab Results  Component Value Date   NA 141 06/21/2018   K 3.7 06/21/2018   CO2 26 06/21/2018   GLUCOSE 89 06/21/2018   BUN 9 06/21/2018   CREATININE 0.80 06/21/2018   BILITOT 0.3 06/21/2018   ALKPHOS 98 11/29/2016   AST 17 06/21/2018   ALT 9 06/21/2018   PROT 6.8 06/21/2018   ALBUMIN 3.6 11/29/2016   CALCIUM 9.4 06/21/2018   ANIONGAP 11 08/04/2016   Lab Results  Component Value Date   CHOL 177 07/22/2018   Lab Results  Component Value Date   HDL 59 07/22/2018   Lab Results  Component Value Date   LDLCALC 109 (H) 06/21/2018   Lab Results  Component Value Date   TRIG 106 07/22/2018   Lab Results  Component Value Date   CHOLHDL 3.9 06/21/2018   Lab Results  Component Value Date   HGBA1C 5.6 06/21/2018       Assessment & Plan:   Problem List Items Addressed This Visit    None        No orders of the defined types were placed in this encounter.    Beatrice Lecher, MD

## 2018-09-29 ENCOUNTER — Other Ambulatory Visit (HOSPITAL_COMMUNITY): Payer: Self-pay | Admitting: Psychiatry

## 2018-10-03 ENCOUNTER — Encounter: Payer: Self-pay | Admitting: Sports Medicine

## 2018-10-03 ENCOUNTER — Ambulatory Visit (INDEPENDENT_AMBULATORY_CARE_PROVIDER_SITE_OTHER): Payer: Medicare Other | Admitting: Sports Medicine

## 2018-10-03 DIAGNOSIS — G8929 Other chronic pain: Secondary | ICD-10-CM

## 2018-10-03 DIAGNOSIS — M25512 Pain in left shoulder: Secondary | ICD-10-CM

## 2018-10-03 NOTE — Assessment & Plan Note (Addendum)
Impingement and glenohumeral type pain, we did a subacromial injection back in September 2019, she did well. Repeat left subacromial injection today, we can do a virtual visit/Doximity in 1 month to evaluate response. I am going to add some formal PT.

## 2018-10-03 NOTE — Progress Notes (Signed)
Subjective:    CC: Left shoulder pain  HPI: Localized over the deltoid, worsening over the past few weeks, moderate, persistent, no radiation, worse with abduction, overhead activities, we last injected her January 23, 2018.  I reviewed the past medical history, family history, social history, surgical history, and allergies today and no changes were needed.  Please see the problem list section below in epic for further details.  Past Medical History: Past Medical History:  Diagnosis Date  . Anxiety   . Arthritis   . CVA (cerebral infarction)    Old CVA on MRI brain  . Depression   . DVT (deep venous thrombosis) ([place]) 03/17/1593   L basilic vein   . Facet hypertrophy of lumbar region    MRI 2007  . Fibromyalgia   . GERD (gastroesophageal reflux disease)   . Hypertension    no meds now, lost 50lbs  . Migraines   . MS (multiple sclerosis) ([place])   . Neuromuscular disorder ([place])   . Obesity   . PTSD (post-traumatic stress disorder)   . Seizures ([place]) 2011   no seizure since onset  . Sleep apnea    diagnosed 20 ago, lost wt no CPAP now  . Smoking   . Stroke [facility])    doesn't know when   Past Surgical History: Past Surgical History:  Procedure Laterality Date  . CHOLECYSTECTOMY  10/2017  . CHONDROPLASTY Left 12/30/2014   Procedure: CHONDROPLASTY;  Surgeon: Leandrew Koyanagi, MD;  Location: [place];  Service: Orthopedics;  Laterality: Left;  . KNEE ARTHROSCOPY Left 12/30/2014   Procedure: LEFT KNEE ARTHROSCOPY WITH   CHONDROPLASTY;  Surgeon: Leandrew Koyanagi, MD;  Location: [place];  Service: Orthopedics;  Laterality: Left;  . PARTIAL KNEE ARTHROPLASTY Left 08/03/2016   Procedure: LEFT UNICOMPARTMENTAL KNEE ARTHROPLASTY;  Surgeon: Leandrew Koyanagi, MD;  Location: [place];  Service: Orthopedics;  Laterality: Left;  . TONSILLECTOMY    . TUBAL LIGATION  1992   Social History: Social History   Socioeconomic History  . Marital status: Single    Spouse  name: Not on file  . Number of children: Not on file  . Years of education: Not on file  . Highest education level: Not on file  Occupational History  . Not on file  Social Needs  . Financial resource strain: Not on file  . Food insecurity:    Worry: Not on file    Inability: Not on file  . Transportation needs:    Medical: Not on file    Non-medical: Not on file  Tobacco Use  . Smoking status: Current Every Day Smoker    Packs/day: 1.00    Years: 25.00    Pack years: 25.00    Types: Cigarettes  . Smokeless tobacco: Never Used  . Tobacco comment: advised to d/c by 3 cigs per week.  Substance and Sexual Activity  . Alcohol use: No    Alcohol/week: 0.0 standard drinks    Comment: occasional  . Drug use: No  . Sexual activity: Yes    Birth control/protection: None, Post-menopausal  Lifestyle  . Physical activity:    Days per week: Not on file    Minutes per session: Not on file  . Stress: Not on file  Relationships  . Social connections:    Talks on phone: Not on file    Gets together: Not on file    Attends religious service: Not on file    Active member of  club or organization: Not on file    Attends meetings of clubs or organizations: Not on file    Relationship status: Not on file  Other Topics Concern  . Not on file  Social History Narrative  . Not on file   Family History: Family History  Problem Relation Age of Onset  . Cancer Mother 91       melanoma  . Hypertension Sister   . Heart disease Sister        valve disease  . Depression Sister   . Diabetes Sister   . Sjogren's syndrome Sister    Allergies: Allergies  Allergen Reactions  . Ace Inhibitors     REACTION: cough  . Atenolol Other (See Comments)    Bradycardia  . Celexa [Citalopram Hydrobromide] Other (See Comments)    Dizziness Tolerated Lexapro   . Codeine Nausea Only  . Nitroglycerin Other (See Comments)    Drop in BP  . Phenytoin Swelling  . Sertraline Other (See Comments)     unknown  . Sumatriptan Nausea Only  . Topamax [Topiramate] Other (See Comments)    She reports that this makes her forgetful and causes her to studder  . Triamcinolone     Steroid flare after joint injection, use other steroid for injections   Medications: See med rec.  Review of Systems: No fevers, chills, night sweats, weight loss, chest pain, or shortness of breath.   Objective:    General: Well Developed, well nourished, and in no acute distress.  Neuro: Alert and oriented x3, extra-ocular muscles intact, sensation grossly intact.  HEENT: Normocephalic, atraumatic, pupils equal round reactive to light, neck supple, no masses, no lymphadenopathy, thyroid nonpalpable.  Skin: Warm and dry, no rashes. Cardiac: Regular rate and rhythm, no murmurs rubs or gallops, no lower extremity edema.  Respiratory: Clear to auscultation bilaterally. Not using accessory muscles, speaking in full sentences. Left shoulder: Inspection reveals no abnormalities, atrophy or asymmetry. Palpation is normal with no tenderness over AC joint or bicipital groove. ROM is full in all planes. Rotator cuff strength normal throughout. Positive Neer and Hawkin's tests, empty can. Speeds and Yergason's tests normal. No labral pathology noted with negative Obrien's, negative crank, negative clunk, and good stability. Normal scapular function observed. No painful arc and no drop arm sign. No apprehension sign  Procedure: Real-time Ultrasound Guided injection of the left subacromial bursa Device: GE Logiq E  Verbal informed consent obtained.  Time-out conducted.  Noted no overlying erythema, induration, or other signs of local infection.  Skin prepped in a sterile fashion.  Local anesthesia: Topical Ethyl chloride.  With sterile technique and under real time ultrasound guidance:  1 cc Kenalog 40, 1 cc lidocaine, 1 cc bupivacaine injected easily Completed without difficulty  Pain immediately resolved suggesting  accurate placement of the medication.  Advised to call if fevers/chills, erythema, induration, drainage, or persistent bleeding.  Images permanently stored and available for review in the ultrasound unit.  Impression: Technically successful ultrasound guided injection.  Impression and Recommendations:    Left shoulder pain Impingement and glenohumeral type pain, we did a subacromial injection back in September 2019, she did well. Repeat left subacromial injection today, we can do a virtual visit/Doximity in 1 month to evaluate response. I am going to add some formal PT.   ___________________________________________ Gwen Her. Dianah Field, M.D., ABFM., CAQSM. Primary Care and Sports Medicine [facility]  Adjunct Professor of [employer]

## 2018-10-04 ENCOUNTER — Encounter: Payer: Self-pay | Admitting: Sports Medicine

## 2018-10-04 ENCOUNTER — Ambulatory Visit (INDEPENDENT_AMBULATORY_CARE_PROVIDER_SITE_OTHER): Payer: Medicare Other | Admitting: Family Medicine

## 2018-10-04 ENCOUNTER — Encounter: Payer: Self-pay | Admitting: Family Medicine

## 2018-10-04 VITALS — BP 153/88 | HR 80 | Temp 98.4°F

## 2018-10-04 DIAGNOSIS — Z6841 Body Mass Index (BMI) 40.0 and over, adult: Secondary | ICD-10-CM | POA: Insufficient documentation

## 2018-10-04 DIAGNOSIS — R635 Abnormal weight gain: Secondary | ICD-10-CM

## 2018-10-04 DIAGNOSIS — R102 Pelvic and perineal pain unspecified side: Secondary | ICD-10-CM

## 2018-10-04 DIAGNOSIS — Z720 Tobacco use: Secondary | ICD-10-CM

## 2018-10-04 DIAGNOSIS — R1032 Left lower quadrant pain: Secondary | ICD-10-CM

## 2018-10-04 LAB — CBC WITH DIFFERENTIAL/PLATELET
Absolute Monocytes: 825 {cells}/uL (ref 200–950)
Basophils Absolute: 15 {cells}/uL (ref 0–200)
Basophils Relative: 0.1 %
Eosinophils Absolute: 0 {cells}/uL — ABNORMAL LOW (ref 15–500)
Eosinophils Relative: 0 %
HCT: 43 % (ref 35.0–45.0)
Hemoglobin: 13.8 g/dL (ref 11.7–15.5)
Lymphs Abs: 1380 {cells}/uL (ref 850–3900)
MCH: 27.9 pg (ref 27.0–33.0)
MCHC: 32.1 g/dL (ref 32.0–36.0)
MCV: 87 fL (ref 80.0–100.0)
MPV: 11.5 fL (ref 7.5–12.5)
Monocytes Relative: 5.5 %
Neutro Abs: 12780 {cells}/uL — ABNORMAL HIGH (ref 1500–7800)
Neutrophils Relative %: 85.2 %
Platelets: 282 Thousand/uL (ref 140–400)
RBC: 4.94 Million/uL (ref 3.80–5.10)
RDW: 12.7 % (ref 11.0–15.0)
Total Lymphocyte: 9.2 %
WBC: 15 Thousand/uL — ABNORMAL HIGH (ref 3.8–10.8)

## 2018-10-04 LAB — WET PREP FOR TRICH, YEAST, CLUE
MICRO NUMBER:: 500100
Specimen Quality: ADEQUATE

## 2018-10-04 MED ORDER — VARENICLINE TARTRATE 0.5 MG X 11 & 1 MG X 42 PO MISC: ORAL | 0 refills | Status: DC

## 2018-10-04 NOTE — Progress Notes (Signed)
Established Patient Office Visit  Subjective:  Patient ID: Ashley Gomez, female    DOB: [birth year]  Age: 48 y.o. MRN: [id number]  CC:  Chief Complaint  Patient presents with  . Pelvic Pain    HPI ESTREYA CLAY presents for pelvic pain is mostly on the left low side almost near the groin crease.  But she feels like it is deeper inside.  She says been going on for about a month now.  She says it can come and go and can vary in intensity.  She does not feel like position change or movement of the hip either causes it or irritates it.  She denies any abnormal vaginal discharge or bleeding.  Fevers chills or sweats.  She has felt like she has been chronically bloated and sometimes get stools that are watery and yet muddy.  But she is [facility] seen GI for that and its not necessarily new.  She was concerned and just wanted to make sure that it might not be related to an ovarian cyst which she has or a possible kidney stone or adrenal mass.    She is also considering bariatric surgery and has consulted with the surgeon.  She brought some paperwork for me to complete and [place] [facility] and support of her weight loss.  She is most interested in gastric banding at this point.  She feels like it would help a lot of her joint pains and problems.  She would like something to help her quit smoking.  She also wants to check to make sure that her tetanus up-to-date.  She has a granddaughter who will be born in September.   Past Medical History:  Diagnosis Date  . Anxiety   . Arthritis   . CVA (cerebral infarction)    Old CVA on MRI brain  . Depression   . DVT (deep venous thrombosis) ([place]) 95/63/8756   L basilic vein   . Facet hypertrophy of lumbar region    MRI 2007  . Fibromyalgia   . GERD (gastroesophageal reflux disease)   . Hypertension    no meds now, lost 50lbs  . Migraines   . MS (multiple sclerosis) ([place])   . Neuromuscular disorder ([place])   . Obesity   . PTSD  (post-traumatic stress disorder)   . Seizures ([place]) 2011   no seizure since onset  . Sleep apnea    diagnosed 20 ago, lost wt no CPAP now  . Smoking   . Stroke [facility])    doesn't know when    Past Surgical History:  Procedure Laterality Date  . CHOLECYSTECTOMY  10/2017  . CHONDROPLASTY Left 12/30/2014   Procedure: CHONDROPLASTY;  Surgeon: Leandrew Koyanagi, MD;  Location: [place];  Service: Orthopedics;  Laterality: Left;  . KNEE ARTHROSCOPY Left 12/30/2014   Procedure: LEFT KNEE ARTHROSCOPY WITH   CHONDROPLASTY;  Surgeon: Leandrew Koyanagi, MD;  Location: [place];  Service: Orthopedics;  Laterality: Left;  . PARTIAL KNEE ARTHROPLASTY Left 08/03/2016   Procedure: LEFT UNICOMPARTMENTAL KNEE ARTHROPLASTY;  Surgeon: Leandrew Koyanagi, MD;  Location: [place];  Service: Orthopedics;  Laterality: Left;  . TONSILLECTOMY    . TUBAL LIGATION  1992    Family History  Problem Relation Age of Onset  . Cancer Mother 46       melanoma  . Hypertension Sister   . Heart disease Sister        valve disease  . Depression Sister   .  Diabetes Sister   . Sjogren's syndrome Sister     Social History   Socioeconomic History  . Marital status: Single    Spouse name: Not on file  . Number of children: Not on file  . Years of education: Not on file  . Highest education level: Not on file  Occupational History  . Not on file  Social Needs  . Financial resource strain: Not on file  . Food insecurity:    Worry: Not on file    Inability: Not on file  . Transportation needs:    Medical: Not on file    Non-medical: Not on file  Tobacco Use  . Smoking status: Current Every Day Smoker    Packs/day: 1.00    Years: 25.00    Pack years: 25.00    Types: Cigarettes  . Smokeless tobacco: Never Used  . Tobacco comment: advised to d/c by 3 cigs per week.  Substance and Sexual Activity  . Alcohol use: No    Alcohol/week: 0.0 standard drinks    Comment: occasional  . Drug use: No   . Sexual activity: Yes    Birth control/protection: None, Post-menopausal  Lifestyle  . Physical activity:    Days per week: Not on file    Minutes per session: Not on file  . Stress: Not on file  Relationships  . Social connections:    Talks on phone: Not on file    Gets together: Not on file    Attends religious service: Not on file    Active member of club or organization: Not on file    Attends meetings of clubs or organizations: Not on file    Relationship status: Not on file  . Intimate partner violence:    Fear of current or ex partner: Not on file    Emotionally abused: Not on file    Physically abused: Not on file    Forced sexual activity: Not on file  Other Topics Concern  . Not on file  Social History Narrative  . Not on file    Outpatient Medications Prior to Visit  Medication Sig Dispense Refill  . acetaminophen (TYLENOL) 500 MG tablet Take 1,000 mg by mouth every 6 (six) hours as needed for mild pain or headache.    [place] Kitchen azelastine (ASTELIN) 0.1 % nasal spray Place 2 sprays into both nostrils 2 (two) times daily. Use in each nostril as directed 30 mL 12  . busPIRone (BUSPAR) 7.5 MG tablet TAKE 1 TABLET BY MOUTH EVERY DAY 90 tablet 1  . celecoxib (CELEBREX) 200 MG capsule TAKE 2 CAPSULES BY MOUTH AS NEEDED FOR PAIN 180 capsule 0  . [START ON 10/14/2018] clonazePAM (KLONOPIN) 1 MG tablet Take 0.5-1 tablets (0.5-1 mg total) by mouth 2 (two) times daily as needed for anxiety. This is 30 days supply. We are tapering since on opioids 50 tablet 1  . diclofenac sodium (VOLTAREN) 1 % GEL See admin instructions.    . DULoxetine (CYMBALTA) 30 MG capsule TAKE 1 CAPSULE BY MOUTH DAILY. THIS IS IN ADDITION TO 60MG  TAKING DAILY. TOTAL DOSE BE 90 MG 30 capsule 0  . DULoxetine (CYMBALTA) 60 MG capsule Take 1 capsule (60 mg total) by mouth daily. 30 capsule 0  . eletriptan (RELPAX) 40 MG tablet Take 1 tablet (40 mg total) by mouth as needed for migraine or headache. May repeat in 2  hours if headache persists or recurs. 10 tablet 0  . Erenumab-aooe (AIMOVIG) 140 MG/ML SOAJ Inject 140  mg into the skin every 30 (thirty) days. 1 pen 3  . famotidine (PEPCID) 20 MG tablet Take 1 tablet (20 mg total) by mouth daily. 90 tablet 3  . HYDROcodone-acetaminophen (NORCO) 10-325 MG tablet Take 1 tablet by mouth every 6 (six) hours as needed for pain. for pain  0  . omeprazole (PRILOSEC) 40 MG capsule Take 1 capsule (40 mg total) by mouth daily. 90 capsule 3  . ondansetron (ZOFRAN) 4 MG tablet take 1 to 2 tablet by mouth every 8 hours if needed for nausea and vomiting 30 tablet 1  . traZODone (DESYREL) 50 MG tablet Take 0.5-2 tablets (25-100 mg total) by mouth at bedtime as needed for sleep. 45 tablet 3  . Triamcinolone Acetonide (TRIAMCINOLONE 0.1 % CREAM : EUCERIN) CREA Apply 1 application topically at bedtime. 1 Jar. 1 each 1  . Vitamin D, Ergocalciferol, (DRISDOL) 1.25 MG (50000 UT) CAPS capsule Take 1 capsule (50,000 Units total) by mouth every 7 (seven) days. 12 capsule 0   No facility-administered medications prior to visit.     Allergies  Allergen Reactions  . Ace Inhibitors     REACTION: cough  . Atenolol Other (See Comments)    Bradycardia  . Celexa [Citalopram Hydrobromide] Other (See Comments)    Dizziness Tolerated Lexapro   . Codeine Nausea Only  . Nitroglycerin Other (See Comments)    Drop in BP  . Phenytoin Swelling  . Sertraline Other (See Comments)    unknown  . Sumatriptan Nausea Only  . Topamax [Topiramate] Other (See Comments)    She reports that this makes her forgetful and causes her to studder  . Triamcinolone     Steroid flare after joint injection, use other steroid for injections    ROS Review of Systems    Objective:    Physical Exam  BP (!) 153/88   Pulse 80   Temp 98.4 F (36.9 C) (Oral)   LMP 12/28/2014  Wt Readings from Last 3 Encounters:  10/03/18 265 lb (120.2 kg)  07/30/18 265 lb (120.2 kg)  07/26/18 266 lb (120.7 kg)      There are no preventive care reminders to display for this patient.  There are no preventive care reminders to display for this patient.  Lab Results  Component Value Date   TSH 0.53 02/15/2018   Lab Results  Component Value Date   WBC 8.9 07/22/2018   HGB 15.1 06/21/2018   HCT 44 (A) 07/22/2018   MCV 91 07/22/2018   PLT 244 07/22/2018   Lab Results  Component Value Date   NA 141 06/21/2018   K 3.7 06/21/2018   CO2 26 06/21/2018   GLUCOSE 89 06/21/2018   BUN 9 06/21/2018   CREATININE 0.80 06/21/2018   BILITOT 0.3 06/21/2018   ALKPHOS 98 11/29/2016   AST 17 06/21/2018   ALT 9 06/21/2018   PROT 6.8 06/21/2018   ALBUMIN 3.6 11/29/2016   CALCIUM 9.4 06/21/2018   ANIONGAP 11 08/04/2016   Lab Results  Component Value Date   CHOL 177 07/22/2018   Lab Results  Component Value Date   HDL 59 07/22/2018   Lab Results  Component Value Date   LDLCALC 109 (H) 06/21/2018   Lab Results  Component Value Date   TRIG 106 07/22/2018   Lab Results  Component Value Date   CHOLHDL 3.9 06/21/2018   Lab Results  Component Value Date   HGBA1C 5.6 06/21/2018      Assessment & Plan:  Problem List Items Addressed This Visit      Other   BMI 40.0-44.9, adult ([place])    Other Visit Diagnoses    Pelvic pain    -  Primary   Relevant Orders   WET PREP FOR [place], YEAST, CLUE   US PELVIC COMPLETE WITH TRANSVAGINAL   CBC with Differential   C. trachomatis/N. gonorrhoeae RNA   LLQ pain       Relevant Orders   US PELVIC COMPLETE WITH TRANSVAGINAL   CBC with Differential   C. trachomatis/N. gonorrhoeae RNA   Tobacco abuse       Abnormal weight gain          Pelvic pain/left lower quadrant pain-unclear etiology.  The pain is extremely low almost to the groin crease to a slightly above it.  It feels like it is more in the skin and or fatty tissue as the tenderness seems to be present just with mild palpation.  No rebound or guarding.  No red flag symptoms.  The patient  feels that her pain is going deeper.  Wet prep and GC chlamydia testing performed.  Her Pap smear is actually up-to-date for 2 more years.  I did not see any cervical lesions.  We will schedule her for pelvic ultrasound for further evaluation.  No signs of diverticulitis at this point.  Tobacco abuse-will send over prescription for Chantix.  Her Tdap is up-to-date for at least 2 more years and then she will be due for booster.  Abnormal weight gain/BMI 41-we will complete paperwork for [facility] [place] [facility].  In regards to what she has tried in the past mostly it is just home dieting.  She has not done any formal programs.  And trying to increase exercise and activity levels.  She did do blue sky for short period of time but felt like it really was not that helpful.  Meds ordered this encounter  Medications  . varenicline (CHANTIX PAK) 0.5 MG X 11 & 1 MG X 42 tablet    Sig: Take one 0.5 mg tablet by mouth once daily for 3 days, then increase to one 0.5 mg tablet twice daily for 4 days, then increase to one 1 mg tablet twice daily.    Dispense:  53 tablet    Refill:  0    Follow-up: Return if symptoms worsen or fail to improve.    Beatrice Lecher, MD

## 2018-10-05 LAB — C. TRACHOMATIS/N. GONORRHOEAE RNA
C. trachomatis RNA, TMA: NOT DETECTED
N. gonorrhoeae RNA, TMA: NOT DETECTED

## 2018-10-07 ENCOUNTER — Other Ambulatory Visit: Payer: Self-pay | Admitting: Family Medicine

## 2018-10-09 ENCOUNTER — Other Ambulatory Visit: Payer: Self-pay

## 2018-10-09 ENCOUNTER — Ambulatory Visit (INDEPENDENT_AMBULATORY_CARE_PROVIDER_SITE_OTHER): Payer: Medicare Other

## 2018-10-09 DIAGNOSIS — R102 Pelvic and perineal pain: Secondary | ICD-10-CM

## 2018-10-09 DIAGNOSIS — R1032 Left lower quadrant pain: Secondary | ICD-10-CM

## 2018-10-10 ENCOUNTER — Encounter: Payer: Self-pay | Admitting: Family Medicine

## 2018-10-10 DIAGNOSIS — R102 Pelvic and perineal pain: Secondary | ICD-10-CM

## 2018-10-11 ENCOUNTER — Other Ambulatory Visit: Payer: Self-pay | Admitting: Family Medicine

## 2018-10-11 NOTE — Telephone Encounter (Signed)
Please advise if RF appropriate

## 2018-10-14 DIAGNOSIS — G8929 Other chronic pain: Secondary | ICD-10-CM | POA: Diagnosis not present

## 2018-10-14 DIAGNOSIS — M545 Low back pain: Secondary | ICD-10-CM | POA: Diagnosis not present

## 2018-10-14 DIAGNOSIS — G894 Chronic pain syndrome: Secondary | ICD-10-CM | POA: Diagnosis not present

## 2018-10-14 DIAGNOSIS — Z79899 Other long term (current) drug therapy: Secondary | ICD-10-CM | POA: Diagnosis not present

## 2018-10-15 LAB — URINALYSIS, ROUTINE W REFLEX MICROSCOPIC
Bilirubin Urine: NEGATIVE
Glucose, UA: NEGATIVE
Hgb urine dipstick: NEGATIVE
Ketones, ur: NEGATIVE
Leukocytes,Ua: NEGATIVE
Nitrite: NEGATIVE
Protein, ur: NEGATIVE
Specific Gravity, Urine: 1.018 (ref 1.001–1.03)
pH: 5.5 (ref 5.0–8.0)

## 2018-10-15 LAB — URINE CULTURE
MICRO NUMBER:: 524231
SPECIMEN QUALITY:: ADEQUATE

## 2018-10-16 ENCOUNTER — Ambulatory Visit (HOSPITAL_COMMUNITY): Payer: Medicare Other | Admitting: Licensed Clinical Social Worker

## 2018-10-16 ENCOUNTER — Encounter: Payer: Self-pay | Admitting: Family Medicine

## 2018-10-16 DIAGNOSIS — R102 Pelvic and perineal pain: Secondary | ICD-10-CM

## 2018-10-16 DIAGNOSIS — D72829 Elevated white blood cell count, unspecified: Secondary | ICD-10-CM

## 2018-10-16 NOTE — Progress Notes (Unsigned)
   THERAPIST PROGRESS NOTE  Session Time: ***  Participation Level: {BHH PARTICIPATION LEVEL:22264}  Behavioral Response: {Appearance:22683}{BHH LEVEL OF CONSCIOUSNESS:22305}{BHH MOOD:22306}  Type of Therapy: {CHL AMB BH Type of Therapy:21022741}  Treatment Goals addressed: {CHL AMB BH Treatment Goals Addressed:21022754}  Interventions: {CHL AMB BH Type of Intervention:21022753}  Summary: Ashley Gomez is a 48 y.o. female who presents with    Suicidal/Homicidal: {BHH YES OR NO:22294}{yes/no/with/without intent/plan:22693}  Therapist Response: ***  Plan: Return again in *** weeks.  Diagnosis: Axis I: {psych axis 1:31909}    Axis II: {psych axis 2:31910}    Cordella Register, LCSW 10/16/2018

## 2018-10-17 ENCOUNTER — Ambulatory Visit (INDEPENDENT_AMBULATORY_CARE_PROVIDER_SITE_OTHER): Payer: Medicare Other | Admitting: Licensed Clinical Social Worker

## 2018-10-17 DIAGNOSIS — F4329 Adjustment disorder with other symptoms: Secondary | ICD-10-CM

## 2018-10-17 DIAGNOSIS — F411 Generalized anxiety disorder: Secondary | ICD-10-CM

## 2018-10-17 DIAGNOSIS — F4381 Prolonged grief disorder: Secondary | ICD-10-CM

## 2018-10-17 DIAGNOSIS — Z634 Disappearance and death of family member: Secondary | ICD-10-CM

## 2018-10-17 DIAGNOSIS — F4310 Post-traumatic stress disorder, unspecified: Secondary | ICD-10-CM

## 2018-10-17 DIAGNOSIS — G894 Chronic pain syndrome: Secondary | ICD-10-CM | POA: Diagnosis not present

## 2018-10-17 DIAGNOSIS — F331 Major depressive disorder, recurrent, moderate: Secondary | ICD-10-CM

## 2018-10-17 DIAGNOSIS — F4321 Adjustment disorder with depressed mood: Secondary | ICD-10-CM

## 2018-10-17 NOTE — Progress Notes (Signed)
Virtual Visit via Telephone Note  I connected with Ashley Gomez on 10/17/18 at 10:00 AM EDT by telephone and verified that I am speaking with the correct person using two identifiers.   I discussed the limitations, risks, security and privacy concerns of performing an evaluation and management service by telephone and the availability of in person appointments. I also discussed with the patient that there may be a patient responsible charge related to this service. The patient expressed understanding and agreed to proceed.  THERAPIST PROGRESS NOTE  Session Time: 10:02 AM to 10:54 AM  Participation Level: Active  Behavioral Response: CasualAlertDepressed, Dysphoric and tearful  Type of Therapy: Individual Therapy  Treatment Goals addressed: Patient work on coping skills for stress management, decrease in anxiety and depression, coping Interventions: Solution Focused, Strength-based, Supportive, Reframing and Other: process grief  Summary: Ashley Gomez is a 48 y.o. femalewho presents with I'm tired. I have a bad headache, wake up every day with a headache. I don't know if it is the medications I am taking. The shots are not taking as far as the headache. Medication is Aimovig. Right now I have more physical symptoms that makes me more depressed and anxious. I have some kind of infection in my body because white blood cells are really high. Doctor did an exam and ultra sound don't know where pain is coming from. There is pain there and also where took gallbladder out. It is frustrating not knowing. I know something wrong but nobody telling me what it is. I am not asking for medicine just tell me what is wrong and I can fix it. I have a house to take care of, kids and grandkids and me to take care of. I can't sleep went to bed the day before yesterday at 2-3 and woke up at 6. I was exhausted when fell asleep last night. My mind races, I find myself up a lot and at the same time. It is crazy to  me I am up during times of accident and when Ashley Gomez passed away. That is what goes through my mind. Something tells me to look at the clock and that is what time it is. It still bothers me. I don't know why I am having such a hard time sleeping maybe because it is going through my mind. I have had trouble sleeping for about 6 months. Reviewed ways she finds daughter's prescience in current life. Her kids are a big part of it. In my room I have photo book of her that sits on a dresser on a stand. It is a open and there is a picture of her. One of my favorites. I have a napkin that sits between two legs of the stand and says "a women's place is on a pedestal." First thing I see and the last thing before I go to bed. I talk about her a lot. My youngest grandson looks like her. There is a grandchildren on the way and they are going to incorporate Ashley Gomez's name Time has gone by fast but I can't remember things in the last five years. I can't think of a whole lot as far as good or bad. It is like my memory is gone. I can't remember everything since I lost my daughter. I remember a lot of bad stuff growing up. I remember some good stuff growing up too. Unless I look at pictures or video that remind me, don't have a good memory. "It is almost like I  lost my mind". But shares that I am sane and know what is going on now.  Discussed keeping in mind the importance of being present focused. I agree with that. My kids are my world but a piece is missing. I can't come to terms that she is actually gone. I am a member of parents who lost a child online. I can't read it a lot because I feel the same way they did.  If I didn't read that medical report and see that hospital killed her then it wouldn't be as bad. Reviewed that medical team gave her a muscle paralytic and not supposed to give to a pregnant woman. I think if I knew more than her medical report it would help. Why did Ashley Gomez fall asleep? Why were they at the exit so  long after the birthday? Challenged patient on knowing more information would make her feel better. Discussed making the best decision at the time. I don't see I made best decisions. I don't deserve to feel better. From a feeling of guilt because I didn't go get her.  Reviewed feeling of guilt is one aspect that it is hard to move forward with grieving and forgiveness needs to continue to be addressed in therapy. She describes feeling "I am not there". I know I have to figure out a way to cope with it better. I feel ok that and is a long way from where I was but days where I don't want to be around anybody else. Seeing Ashley Gomez who looks a lot like Health helps a lot in losing Ashley Gomez as she acts just like her and looks like her. I have my Ashley Gomez back but at the same time she is not Ashley Gomez Reviewed again feeling their prescence in your life and as a part of you and presence that can lead her forward. Most days I do. Days laying next to me and it makes me happy. I need to still be working on it, I have to be the best grandparent I can be and I know I have a way to go. Ashley Gomez knows when having a bad day I snap. When she tells me that I have other things on my mind and not complete. Suicidal/Homicidal: No  Therapist Response: Therapist assessed patient current functioning and processed feelings related to current stressors that include physical symptoms and grief issues.  Reviewed progress and therapist identified a sign of progress that she is more invested in the role she has to play in her life as a mother and grandmother.   Discussed coping with grief includes that  those we love deeply become a part of [name].  While time can sometimes increase the hardship, it also increases the capacity for joy because of the ever-increasing ways you can feel your daughters as part of her.  Discussed ways that she can bring her presence into her life.  Ways that help her feel that she is with you.  Reviewed a reading that  serves as guidance for working on grief that that includes closing your eyes and look into your loved ones eyes. Put out your hand to feel her hand in yours. ask them to be with you for a while.  Asked them if they have any advice for you on how to live without them still being alive with you.   Reviewed that memories of her loved ones are the pearls we form around the grain of grief that causes [name] pain. Memories give [name] warmth  and sustenance. Conversely, it is the feeling that they can never be repeated that tears [name] apart. It is good when a memory brings warmth, for a moment, doesn't tear [name] apart. Identified guilt as a roadblock for patient and worked on challenging this feeling and working on self forgiveness including recognizing you do the best you can in situations and that she does not have control over life events.   Provided strength based and supportive  Intervention.  Plan: Return again in 3 weeks.2.  Continue to work with patient on processing grief, mood regulation and stress management  Diagnosis: Axis I: generalized anxiety disorder PTSD, complicated grief, chronic pain syndrome, Major depressive disorder, recurrent moderate   Axis II: No diagnosis  Follow Up Instructions:    I discussed the assessment and treatment plan with the patient. The patient was provided an opportunity to ask questions and all were answered. The patient agreed with the plan and demonstrated an understanding of the instructions.   The patient was advised to call back or seek an in-person evaluation if the symptoms worsen or if the condition fails to improve as anticipated.  I provided 52 minutes of non-face-to-face time during this encounter.      Cordella Register, LCSW 10/17/2018

## 2018-10-21 ENCOUNTER — Encounter: Payer: Self-pay | Admitting: Rehabilitative and Restorative Service Providers"

## 2018-10-21 ENCOUNTER — Other Ambulatory Visit: Payer: Self-pay

## 2018-10-21 ENCOUNTER — Ambulatory Visit (INDEPENDENT_AMBULATORY_CARE_PROVIDER_SITE_OTHER): Payer: Medicare Other | Admitting: Rehabilitative and Restorative Service Providers"

## 2018-10-21 DIAGNOSIS — D72829 Elevated white blood cell count, unspecified: Secondary | ICD-10-CM | POA: Diagnosis not present

## 2018-10-21 DIAGNOSIS — M25512 Pain in left shoulder: Secondary | ICD-10-CM | POA: Diagnosis not present

## 2018-10-21 DIAGNOSIS — R293 Abnormal posture: Secondary | ICD-10-CM

## 2018-10-21 DIAGNOSIS — R29898 Other symptoms and signs involving the musculoskeletal system: Secondary | ICD-10-CM | POA: Diagnosis not present

## 2018-10-21 LAB — CBC WITH DIFFERENTIAL/PLATELET
Absolute Monocytes: 590 cells/uL (ref 200–950)
Basophils Absolute: 29 cells/uL (ref 0–200)
Basophils Relative: 0.4 %
Eosinophils Absolute: 108 cells/uL (ref 15–500)
Eosinophils Relative: 1.5 %
HCT: 43.8 % (ref 35.0–45.0)
Hemoglobin: 14.3 g/dL (ref 11.7–15.5)
Lymphs Abs: 1829 cells/uL (ref 850–3900)
MCH: 28.7 pg (ref 27.0–33.0)
MCHC: 32.6 g/dL (ref 32.0–36.0)
MCV: 88 fL (ref 80.0–100.0)
MPV: 10.6 fL (ref 7.5–12.5)
Monocytes Relative: 8.2 %
Neutro Abs: 4644 cells/uL (ref 1500–7800)
Neutrophils Relative %: 64.5 %
Platelets: 267 10*3/uL (ref 140–400)
RBC: 4.98 10*6/uL (ref 3.80–5.10)
RDW: 12.7 % (ref 11.0–15.0)
Total Lymphocyte: 25.4 %
WBC: 7.2 10*3/uL (ref 3.8–10.8)

## 2018-10-21 NOTE — Patient Instructions (Signed)
Access Code: [email or web address]  URL: [email or web address]  Date: 10/21/2018  Prepared by: Gillermo Murdoch   Exercises  Seated Cervical Retraction - 10 reps - 1 sets - 3x daily - 7x weekly  Standing Scapular Retraction - 10 reps - 1 sets - 10 hold - 3x daily - 7x weekly  Shoulder External Rotation and Scapular Retraction - 10 reps - 1 sets - hold - 3x daily - 7x weekly  Standing Scapular Retraction in Abduction - 10 reps - 1 sets - 3x daily - 7x weekly  Doorway Pec Stretch at 60 Degrees Abduction - 3 reps - 1 sets - 3x daily - 7x weekly  Doorway Pec Stretch at 90 Degrees Abduction - 3 reps - 1 sets - 30 seconds hold - 3x daily - 7x weekly  Doorway Pec Stretch at 120 Degrees Abduction - 3 reps - 1 sets - 30 second hold hold - 3x daily - 7x weekly  Patient Education  TENS Unit

## 2018-10-21 NOTE — Therapy (Addendum)
[facility] [place], [place], [address] Phone: [phone]   Fax:  [phone]  Physical Therapy Evaluation  Patient Details  Name: ZURIAH BORDAS MRN: [id number] Date of Birth: [birth year] Referring Provider (PT): Dr Dianah Field    Encounter Date: 10/21/2018  PT End of Session - 10/21/18 1202    Visit Number  1    Number of Visits  12    Date for PT Re-Evaluation  12/02/18    PT Start Time  1112    PT Stop Time  1152    PT Time Calculation (min)  40 min    Activity Tolerance  Patient tolerated treatment well       Past Medical History:  Diagnosis Date  . Anxiety   . Arthritis   . CVA (cerebral infarction)    Old CVA on MRI brain  . Depression   . DVT (deep venous thrombosis) ([place]) 42/68/3419   L basilic vein   . Facet hypertrophy of lumbar region    MRI 2007  . Fibromyalgia   . GERD (gastroesophageal reflux disease)   . Hypertension    no meds now, lost 50lbs  . Migraines   . MS (multiple sclerosis) ([place])   . Neuromuscular disorder ([place])   . Obesity   . PTSD (post-traumatic stress disorder)   . Seizures ([place]) 2011   no seizure since onset  . Sleep apnea    diagnosed 20 ago, lost wt no CPAP now  . Smoking   . Stroke [facility])    doesn't know when    Past Surgical History:  Procedure Laterality Date  . CHOLECYSTECTOMY  10/2017  . CHONDROPLASTY Left 12/30/2014   Procedure: CHONDROPLASTY;  Surgeon: Leandrew Koyanagi, MD;  Location: [facility];  Service: Orthopedics;  Laterality: Left;  . KNEE ARTHROSCOPY Left 12/30/2014   Procedure: LEFT KNEE ARTHROSCOPY WITH   CHONDROPLASTY;  Surgeon: Leandrew Koyanagi, MD;  Location: [facility];  Service: Orthopedics;  Laterality: Left;  . PARTIAL KNEE ARTHROPLASTY Left 08/03/2016   Procedure: LEFT UNICOMPARTMENTAL KNEE ARTHROPLASTY;  Surgeon: Leandrew Koyanagi, MD;  Location: [facility];  Service: Orthopedics;  Laterality: Left;  . TONSILLECTOMY     . TUBAL LIGATION  1992    There were no vitals filed for this visit.   Subjective Assessment - 10/21/18 1123    Subjective  Patient reports that she injured Lt shoulder trying to lift her king sixe matterss ~ 09/27/2018. She was seen by Dr T and received injection for Lt shoulder 5/22/202 with some improvement following surgery.     Pertinent History  chronic Lt shoulder pain; partial Lt knee replacement; bursitis Lt hip; gall bladded removed; anxiety; depression; tumor on adreneal gland; MS dx 2000; seizures     Diagnostic tests  xray     Patient Stated Goals  get shoulder feeling better; use Lt arm normally     Currently in Pain?  Yes    Pain Score  3     Pain Location  Shoulder    Pain Orientation  Left    Pain Descriptors / Indicators  Dull    Pain Radiating Towards  up into neck     Pain Onset  1 to 4 weeks ago    Pain Frequency  Constant    Aggravating Factors   lifting grandson (32 #); lifting; reaching; arms overhead     Pain Relieving Factors  leaving arm at side; injection  [facility] PT Assessment - 10/21/18 0001      Assessment   Medical Diagnosis  Lt shoulder dysfunction     Referring Provider (PT)  Dr Dianah Field     Onset Date/Surgical Date  09/27/18    Hand Dominance  Right    Next MD Visit  11/04/2018    Prior Therapy  here x 2 for LBP 2016      Precautions   Precautions  None      Restrictions   Weight Bearing Restrictions  No      Balance Screen   Has the patient fallen in the past 6 months  No    Has the patient had a decrease in activity level because of a fear of falling?   No    Is the patient reluctant to leave their home because of a fear of falling?   No      Home Film/video editor residence      Prior Function   Level of Independence  Independent    Vocation  On disability   ~ 4 yrs    Leisure  household chores; otherwise sedentary       Observation/Other Assessments   Focus on Therapeutic Outcomes (FOTO)    41% limitation       Posture/Postural Control   Posture Comments  head forward; shoulders rounded and elevated; head of the humerus anterior in orientation; scapulae abducted and rotated along the thoracic wall       AROM   AROM Assessment Site  --   tight with cervical lateral flexion and rotation    Right Shoulder Extension  53 Degrees    Right Shoulder Flexion  147 Degrees    Right Shoulder ABduction  151 Degrees    Right Shoulder Internal Rotation  32 Degrees    Right Shoulder External Rotation  105 Degrees    Left Shoulder Extension  32 Degrees    Left Shoulder Flexion  138 Degrees   discomfort   Left Shoulder ABduction  100 Degrees   discomfort    Left Shoulder Internal Rotation  35 Degrees   discomfort    Left Shoulder External Rotation  92 Degrees    Cervical Flexion  52    Cervical Extension  50    Cervical - Right Side Bend  35    Cervical - Left Side Bend  35    Cervical - Right Rotation  61    Cervical - Left Rotation  64      Strength   Right/Left Shoulder  --   poor posterior shoulder girdle control/postural strength     Right Shoulder Flexion  5/5    Right Shoulder Extension  5/5    Right Shoulder ABduction  5/5    Right Shoulder Internal Rotation  5/5    Right Shoulder External Rotation  5/5    Left Shoulder Flexion  --   5-/5   Left Shoulder Extension  5/5    Left Shoulder ABduction  --   5-/5   Left Shoulder Internal Rotation  5/5    Left Shoulder External Rotation  --   5-/5      Palpation   Spinal mobility  hypomobility through the mid to upper thoracic spine with CPA and lateral mobs w/ greatest tenderness through T4/5 area Lt > Rt     Palpation comment  muscular tighness Lt > Rt pecs; upper trap; leveator; teres  Objective measurements completed on examination: See above findings.      [facility] Adult PT Treatment/Exercise - 10/21/18 0001      Neuro Re-ed    Neuro Re-ed Details   iniitated postural correction; focus  on engaging posterior shoulder girdle       Shoulder Exercises: Standing   Other Standing Exercises  scapular retraction 10 sec x 5; axial extension 10 sec x 3; L's x 10; W's x 5 with swim noodle along spine       Shoulder Exercises: Stretch   Other Shoulder Stretches  doorway stretch 30 sec x 2 each position (three positions - w/ highest ~ 90 deg shd abduction)              PT Education - 10/21/18 1150    Education Details  HEP TENS POC     Person(s) Educated  Patient    Methods  Explanation;Demonstration;Tactile cues;Verbal cues;Handout    Comprehension  Verbalized understanding;Returned demonstration;Verbal cues required;Tactile cues required          PT Long Term Goals - 10/21/18 1310      PT LONG TERM GOAL #1   Title  AROM Lt shoulder =/> Rt AROM 12/02/2018    Time  6    Period  Weeks    Status  New      PT LONG TERM GOAL #2   Title  IDe      PT LONG TERM GOAL #3   Title  Decrease pain Lt shoulder by 50-75% allowing patient to use Lt UE for functional activities at prior level of function 12/02/2018    Time  6    Period  Weeks    Status  New      PT LONG TERM GOAL #4   Title  Independent in HEP 12/02/2018     Time  6    Period  Weeks    Status  New      PT LONG TERM GOAL #5   Title  Improve FOTO to </= 38% limitation 12/02/2018    Time  6    Period  Weeks    Status  New             Plan - 10/21/18 1202    Clinical Impression Statement  Lulamae presents with recurrent Lt shoulder pain following efforts to lift a matterss ~ 09/27/2018. She received an injection in Lt shoulder 10/04/2018 with good improvement in shoulder pain. Patient has poor posture and alignment; poor posterior shoulder girdle strength and control; limited Lt shoulder ROM compared to Rt; decreased Lt UE strength; muscular tightness to palpation Lt shoulder girdle; decreased functional activitiy and ADL's due to Lt shoulder pain. Patient will benefit from PT to address problems identified  and is interested in telehealth visits.     Personal Factors and Comorbidities  Comorbidity 1;Comorbidity 2;Comorbidity 3+    Comorbidities  chronic Lt shoulder pain; MS; obesity    Examination-Activity Limitations  Lift;Carry    Examination-Participation Restrictions  Cleaning;Laundry    Stability/Clinical Decision Making  Evolving/Moderate complexity    Clinical Decision Making  Moderate    Rehab Potential  Good    PT Frequency  2x / week    PT Duration  6 weeks    PT Treatment/Interventions  Patient/family education;ADLs/Self Care Home Management;Cryotherapy;Electrical Stimulation;Iontophoresis 76m/ml Dexamethasone;Moist Heat;Ultrasound;Dry needling;Manual techniques;Therapeutic activities;Therapeutic exercise;Neuromuscular re-education;Taping;Passive range of motion;Vasopneumatic Device    PT Next Visit Plan  review HEP; progress with ped stretching and posterior shoulder girdle  strengthening; postural correction; modalities and manual work as indicated is treated in clinic; modfications in treatment as needed for telehealth visits     Consulted and Agree with Plan of Care  Patient       Patient will benefit from skilled therapeutic intervention in order to improve the following deficits and impairments:  Postural dysfunction, Improper body mechanics, Pain, Increased muscle spasms, Hypomobility, Decreased strength, Decreased mobility, Decreased range of motion, Decreased activity tolerance, Impaired UE functional use  Visit Diagnosis: Acute pain of left shoulder - Plan: PT plan of care cert/re-cert  Abnormal posture - Plan: PT plan of care cert/re-cert  Other symptoms and signs involving the musculoskeletal system - Plan: PT plan of care cert/re-cert     Problem List Patient Active Problem List   Diagnosis Date Noted  . BMI 40.0-44.9, adult ([place]) 10/04/2018  . Primary osteoarthritis of left hip 08/05/2018  . B12 deficiency 07/26/2018  . Low calcium levels 07/26/2018  . Family  history of celiac disease 05/29/2018  . Precordial chest pain 03/06/2018  . Dyslipidemia 03/06/2018  . Rheumatoid arthritis ([place]) 02/26/2018  . Wears glasses 02/26/2018  . Adrenal mass ([place]) 01/23/2018  . GERD (gastroesophageal reflux disease) 09/20/2017  . Heavy tobacco smoker 09/20/2017  . Adenoma of left adrenal gland 09/11/2017  . Diverticulosis of colon 09/11/2017  . Left nephrolithiasis 09/11/2017  . Hepatic steatosis 09/07/2017  . Status post left partial knee replacement 08/03/2016  . Left shoulder pain 07/06/2016  . Grief at loss of child 12/23/2015  . Noncompliance with medication treatment due to overuse of medication 11/11/2015  . Trochanteric bursitis of left hip 03/31/2015  . Dependent personality disorder ([place]) 12/03/2014  . Chronic pain syndrome 12/03/2014  . Myofascial pain 11/09/2014  . Dysthymic disorder 10/15/2014  . PTSD (post-traumatic stress disorder) 10/15/2014  . Primary osteoarthritis of both knees 12/21/2011  . B12 DEFICIENCY 06/21/2010  . Vitamin D deficiency 06/21/2010  . UNSPECIFIED ANEMIA 06/21/2010  . NECK PAIN, CHRONIC 06/21/2010  . VARICOSE VEINS, LOWER EXTREMITIES 05/17/2010  . Post-menopausal 05/17/2010  . POSTURAL LIGHTHEADEDNESS 05/17/2010  . NUMBNESS, ARM 04/04/2010  . History of DVT (deep vein thrombosis) 01/13/2010  . SEIZURE DISORDER 01/13/2010  . Depression, major, recurrent, moderate ([place]) 05/16/2009  . OBESITY, UNSPECIFIED 03/09/2009  . Anxiety state 03/09/2009  . CIGARETTE SMOKER 03/09/2009  . MULTIPLE SCLEROSIS, RELAPSING/REMITTING 03/09/2009  . Migraine without aura 03/09/2009  . ESSENTIAL HYPERTENSION, BENIGN 03/09/2009  . Lumbar degenerative disc disease 03/09/2009    Mechel Haggard Nilda Simmer PT, MPH  10/21/2018, 1:18 PM  [facility] [address] [place], [place], [address] Phone: [phone]   Fax:  [phone]  Name: TIKI TUCCIARONE MRN: [id number] Date of Birth:  [birth year]  PHYSICAL THERAPY DISCHARGE SUMMARY  Visits from Start of Care: Evaluation only   Current functional level related to goals / functional outcomes: Unknown    Remaining deficits: Unknown    Education / Equipment: Initial HEP  Plan: Patient agrees to discharge.  Patient goals were not met. Patient is being discharged due to not returning since the last visit.  ?????     Jahnia Hewes P. Helene Kelp PT, MPH 02/06/19 9:06 AM

## 2018-10-22 NOTE — Telephone Encounter (Signed)
Call pt: CBC looks great! Everything improved.

## 2018-10-24 ENCOUNTER — Encounter: Payer: Medicare Other | Admitting: Family Medicine

## 2018-10-24 NOTE — Progress Notes (Signed)
Error

## 2018-10-25 ENCOUNTER — Ambulatory Visit: Payer: Self-pay | Admitting: Family Medicine

## 2018-10-28 ENCOUNTER — Encounter: Payer: Self-pay | Admitting: Physical Therapy

## 2018-10-31 ENCOUNTER — Other Ambulatory Visit (HOSPITAL_COMMUNITY): Payer: Self-pay | Admitting: Psychiatry

## 2018-10-31 ENCOUNTER — Encounter: Payer: Self-pay | Admitting: Physical Therapy

## 2018-11-01 ENCOUNTER — Encounter: Payer: Self-pay | Admitting: Sports Medicine

## 2018-11-04 ENCOUNTER — Ambulatory Visit: Payer: Medicare Other | Admitting: Sports Medicine

## 2018-11-04 ENCOUNTER — Encounter: Payer: Self-pay | Admitting: Physical Therapy

## 2018-11-05 ENCOUNTER — Encounter: Payer: Self-pay | Admitting: Family Medicine

## 2018-11-06 ENCOUNTER — Ambulatory Visit (HOSPITAL_COMMUNITY): Payer: Medicare Other | Admitting: Licensed Clinical Social Worker

## 2018-11-06 ENCOUNTER — Other Ambulatory Visit: Payer: Self-pay | Admitting: Family Medicine

## 2018-11-06 MED ORDER — SAXENDA 18 MG/3ML ~~LOC~~ SOPN: PEN_INJECTOR | SUBCUTANEOUS | 0 refills | Status: DC

## 2018-11-07 ENCOUNTER — Telehealth: Payer: Self-pay | Admitting: Physical Therapy

## 2018-11-07 ENCOUNTER — Encounter: Payer: Medicare Other | Admitting: Physical Therapy

## 2018-11-07 NOTE — Telephone Encounter (Signed)
LVM for pt due to no show for appt.    Laureen Abrahams, PT, DPT 11/07/18 1:26 PM

## 2018-11-11 ENCOUNTER — Ambulatory Visit (INDEPENDENT_AMBULATORY_CARE_PROVIDER_SITE_OTHER): Payer: Medicare Other | Admitting: Licensed Clinical Social Worker

## 2018-11-11 DIAGNOSIS — G894 Chronic pain syndrome: Secondary | ICD-10-CM

## 2018-11-11 DIAGNOSIS — F4310 Post-traumatic stress disorder, unspecified: Secondary | ICD-10-CM

## 2018-11-11 DIAGNOSIS — F411 Generalized anxiety disorder: Secondary | ICD-10-CM

## 2018-11-11 DIAGNOSIS — F4329 Adjustment disorder with other symptoms: Secondary | ICD-10-CM | POA: Diagnosis not present

## 2018-11-11 DIAGNOSIS — F4321 Adjustment disorder with depressed mood: Secondary | ICD-10-CM

## 2018-11-11 DIAGNOSIS — F331 Major depressive disorder, recurrent, moderate: Secondary | ICD-10-CM

## 2018-11-11 DIAGNOSIS — Z634 Disappearance and death of family member: Secondary | ICD-10-CM

## 2018-11-11 NOTE — Progress Notes (Signed)
Virtual Visit via Telephone Note  I connected with Ashley Gomez on 11/11/18 at  4:00 PM EDT by telephone and verified that I am speaking with the correct person using two identifiers.   I discussed the limitations, risks, security and privacy concerns of performing an evaluation and management service by telephone and the availability of in person appointments. I also discussed with the patient that there may be a patient responsible charge related to this service. The patient expressed understanding and agreed to proceed.  Follow Up Instructions:    I discussed the assessment and treatment plan with the patient. The patient was provided an opportunity to ask questions and all were answered. The patient agreed with the plan and demonstrated an understanding of the instructions.   The patient was advised to call back or seek an in-person evaluation if the symptoms worsen or if the condition fails to improve as anticipated.  I provided 52 minutes of non-face-to-face time during this encounter.  THERAPIST PROGRESS NOTE  Session Time: 4:04 PM to 4:56 PM  Participation Level: Active  Behavioral Response: CasualAlertsubdued  Type of Therapy: Individual Therapy  Treatment Goals addressed: generalized anxiety disorder PTSD, complicated grief, chronic pain syndrome, Major depressive disorder, recurrent moderate  Interventions: Solution Focused, Strength-based, Supportive, Reframing and Other: process grief  Summary: Ashley Gomez is a 48 y.o. female who presents with stress with home situation. Staying at home a lot with virus. Had a talk with [name] (son's girlfriend) about parenting skills, so far she has been better. We will see how long that lasts. She tries to have all the attention. She deliberately starts things between me and son or son and daughter so she gets all the attention. When we are talking she says she feels left out. If not included she says something like [name]  (patient's son) can you go by the store. She doesn't want [name] to be getting along. She gets jealous with the baby.  This environment is stressful, I have spent days on my own in my room. Pointed out that this will increase depression. Relates that I sit on the porch with dogs. When to [name]'s yesterday and grandchildren are coming over tomorrow.  Part of it is that I want her to be a mom. Doesn't want to enable Reviewed underlying sources for her behavior. [name] talks about how her dad would treat her and brother want he did to mom and brother. But I don't know because he puts him on a Pedistal  She wants attention and aggravated. Reviewed relationship with [name]'s mom. Get along but can't be along for very long. Says they talk every day but not sure.  Does physical therapy a couple times a week on own on left shoulder. They want to do a video session. Don't need a baby sitter aggravating. I elected to do things on my own.    Talked to [name] (who is 7 months pregnant) about exercise and drink water not sleep all day. (thearpist pointed out that she is encouraging that for patient as well. I told her to take better care of yourself and them because going to have three children. She acts like glad I am here until son gets back from work, then acts like she wishes I  wasn't there, that I get in the middle of both of them. On weekend goes to [name]'s or stays in room to give them a little time.  Conflict over baby shower. We have been talking about [name] a  lot. [name] has been over, baby pictures showed to [place] her baby looks like Water quality scientist and showed [employer] of [name] and she looks a lot like her. Don't want them to start acting out, don't want it to cause problems. Want them to know about [name], it helps to talk about her. Notice when don't sleep or depressed affects her. Sometimes easier and sometimes it is not to talk about it. Grandmother passed in December, memorial last weekend. Tears in  her eyes, [name] started to cry. She didn't ask me her to stop.(like she had in the past)  She offered me Kleenex It took her losing somebody else that she was close to  realize how I was feeling. Her feelings of grief change every day.therapist pointed out that there may also be a lot of pain that makes it difficult to talk about.   Reviewed session and glad got to express some things and have more to talk about. With [name] I am still biting my tongue and think about it at night. Describes the situation with Bandy as awful.  Suicidal/Homicidal: No  Therapist Response: Therapist assessed patient current functioning and processed feelings related to current stressors.  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  Explained that knowing people's motivations and intentions help [name] to cope in relationships understanding why they act the way they do can help with not being so frustrated.  Encouraged patient with activities to get out of room as they will worsen depression and through discussion identified activities that indicate she is getting out of her room, worked on developing insight is important so as not to worsen depressive symptoms.  Discussed grief of daughter, that she has more ways to talk about her, that her daughter has been more open and receptive to her grief which helps.  Reviewed goal of transforming grief as focus of treatment and pointed out patient's important role she plays in keeping memory of her daughter alive, important for her own relationship with daughter but also grandchildren, through patient she can make daughter present in their lives, both with memories could also be with encouraging positive  quality she had that they can exemplify and carry forward in their lives.  Provided supportive and strength based interventions.   Plan: Return again in 2 weeks.2.  Therapist continue to process stressors, coping, processing grief  Diagnosis: Axis I:  generalized anxiety disorder PTSD, complicated grief, chronic pain syndrome, Major depressive disorder, recurrent moderate    Axis II: No diagnosis    Cordella Register, LCSW 11/11/2018

## 2018-11-13 DIAGNOSIS — Z1159 Encounter for screening for other viral diseases: Secondary | ICD-10-CM | POA: Diagnosis not present

## 2018-11-13 DIAGNOSIS — G43909 Migraine, unspecified, not intractable, without status migrainosus: Secondary | ICD-10-CM | POA: Diagnosis not present

## 2018-11-13 DIAGNOSIS — Z79899 Other long term (current) drug therapy: Secondary | ICD-10-CM | POA: Diagnosis not present

## 2018-11-13 DIAGNOSIS — G8929 Other chronic pain: Secondary | ICD-10-CM | POA: Diagnosis not present

## 2018-11-13 DIAGNOSIS — Z79891 Long term (current) use of opiate analgesic: Secondary | ICD-10-CM | POA: Diagnosis not present

## 2018-11-13 DIAGNOSIS — M545 Low back pain: Secondary | ICD-10-CM | POA: Diagnosis not present

## 2018-11-15 ENCOUNTER — Other Ambulatory Visit: Payer: Self-pay | Admitting: Sports Medicine

## 2018-11-15 ENCOUNTER — Other Ambulatory Visit (HOSPITAL_COMMUNITY): Payer: Self-pay | Admitting: Psychiatry

## 2018-11-15 ENCOUNTER — Other Ambulatory Visit: Payer: Self-pay | Admitting: Family Medicine

## 2018-11-15 DIAGNOSIS — M5136 Other intervertebral disc degeneration, lumbar region: Secondary | ICD-10-CM

## 2018-11-20 NOTE — Telephone Encounter (Signed)
Received a fax that Kirke Shaggy was not covered for weight loss on this patients plan. I called the patient to let her know that we got the denial. She states that she received a letter that stated "It was illegal to prescribe this medication for weight loss".   Do you think contrave may have better coverage since she reports that the [place] is almost $1500 for a month. Please advise.

## 2018-11-21 ENCOUNTER — Encounter: Payer: Self-pay | Admitting: Family Medicine

## 2018-11-21 MED ORDER — PREDNISONE 10 MG PO TABS: ORAL_TABLET | ORAL | 0 refills | Status: DC

## 2018-11-22 MED ORDER — CONTRAVE 8-90 MG PO TB12: 1.0000 | ORAL_TABLET | Freq: Two times a day (BID) | ORAL | 1 refills | Status: DC

## 2018-11-22 NOTE — Telephone Encounter (Signed)
Well unfortunately the Saxenda was not covered so we will try to send over another prescription called Contrave.  We will see if that one is covered I just do not know.  It may be that your plan has a exclusion for all weight loss drugs so we will just have to see and go from there.  But I did send that over today.

## 2018-11-24 ENCOUNTER — Other Ambulatory Visit: Payer: Self-pay | Admitting: Family Medicine

## 2018-11-25 ENCOUNTER — Other Ambulatory Visit (HOSPITAL_COMMUNITY): Payer: Self-pay | Admitting: Psychiatry

## 2018-11-25 ENCOUNTER — Telehealth: Payer: Self-pay

## 2018-11-25 NOTE — Telephone Encounter (Signed)
Liviya called and states Medicaid requires a PA for Contrave.

## 2018-11-27 ENCOUNTER — Ambulatory Visit (INDEPENDENT_AMBULATORY_CARE_PROVIDER_SITE_OTHER): Payer: Medicare Other | Admitting: Family Medicine

## 2018-11-27 ENCOUNTER — Encounter: Payer: Self-pay | Admitting: Family Medicine

## 2018-11-27 ENCOUNTER — Other Ambulatory Visit: Payer: Self-pay

## 2018-11-27 VITALS — BP 140/78 | HR 87 | Ht 66.0 in | Wt 281.0 lb

## 2018-11-27 DIAGNOSIS — Z6841 Body Mass Index (BMI) 40.0 and over, adult: Secondary | ICD-10-CM | POA: Diagnosis not present

## 2018-11-27 DIAGNOSIS — R635 Abnormal weight gain: Secondary | ICD-10-CM

## 2018-11-27 DIAGNOSIS — G43019 Migraine without aura, intractable, without status migrainosus: Secondary | ICD-10-CM

## 2018-11-27 DIAGNOSIS — G8929 Other chronic pain: Secondary | ICD-10-CM

## 2018-11-27 DIAGNOSIS — R1013 Epigastric pain: Secondary | ICD-10-CM

## 2018-11-27 DIAGNOSIS — E278 Other specified disorders of adrenal gland: Secondary | ICD-10-CM

## 2018-11-27 MED ORDER — RIZATRIPTAN BENZOATE 10 MG PO TBDP: 10.0000 mg | ORAL_TABLET | Freq: Every day | ORAL | 4 refills | Status: DC | PRN

## 2018-11-27 MED ORDER — PHENTERMINE HCL 15 MG PO CAPS: 15.0000 mg | ORAL_CAPSULE | ORAL | 0 refills | Status: DC

## 2018-11-27 NOTE — Assessment & Plan Note (Signed)
We will try switching to Maxalt melts and see if it works better than the Relpax.  New prescription sent to local pharmacy.

## 2018-11-27 NOTE — Assessment & Plan Note (Addendum)
Discussed options.  Right now are still trying to get the Contrave approved with her insurance we can go ahead and start with phentermine did warn about potential side effects including elevating blood pressure and causing chest pain or palpitations.  She is to monitor for any the symptoms and stop immediately if she experiences them.  We will start with 15 mg and follow back up in 4 weeks.  Current weight: 281 pounds Previous weight: 265 lbs Weight change: up 16 lbs.  Nutrition goal: Eating more than once a day, increasing protein.  Using a protein supplemental drink for breakfast. Exercise goal: Increase activity level. Medication: Phentermine 15 mg Follow-up: 4 weeks

## 2018-11-27 NOTE — Assessment & Plan Note (Signed)
She has had a very rapid weight gain over over very short period of time.  We discussed maybe even checking her thyroid levels.  Last one was normal last October which was less than a year ago but I would be more than happy to do some additional labs.  She says she was to try the phentermine for a month and then if she has not been able to lose weight then we can consider doing some blood work at that time.

## 2018-11-27 NOTE — Assessment & Plan Note (Signed)
Recommend may be going back to a trial of fiber supplements that she seems to have frequent watery diarrhea but at the same time feels like she is not moving her bowels properly.  I think it is worth going back and trying either the Metamucil or FiberCon twice a day.  She is doing a probiotic as well encouraged her to also get back in with GI.  What she is describing sounds like it could also be gastroparesis related and so I do think it needs to be worked up further.

## 2018-11-27 NOTE — Assessment & Plan Note (Signed)
Indeterminate nodule noted on scan from August 2019 in care everywhere.  Recommend repeat ultrasound this year in August just to make sure that it is not changing or getting larger.  Likely adenoma.

## 2018-11-27 NOTE — Progress Notes (Addendum)
Established Patient Office Visit  Subjective:  Patient ID: Ashley Gomez, female    DOB: [birth year]  Age: 48 y.o. MRN: [id number]  CC:  Chief Complaint  Patient presents with  . abnormal weight gain    HPI Ashley Gomez presents for Abnormal weight gain -   she is here today to discuss abnormal weight gain she is interested in having bariatric surgery but needs to have documented weight loss 4 to 6 months.  She says that she just does not understand why she continues to gain a large amount of weight over very short period of time.  She is not currently on any prescription medications for weight loss in fact we had tried to get [name] and then Contrave approved under her insurance we are still trying to work on probably getting a prior authorization through [employer] for the Contrave.  For now she wants to discuss phentermine as a possible option.  She just feels like when she eats the food just sits in her stomach it does not move and it is quite uncomfortable she gets quite bloated most of her pain is in the midepigastric and right upper quadrant area she gets extremely bloated and just does not feel good.  Initially thought maybe it was her gallbladder and she had that removed but it has continued is been going on for at least a year.  She describes her stools as mostly watery with some occasional pencil thin stools.  She is really cut out soft drinks and says most the time she only eats once a day but still has managed to gain 9 pounds in the last month of her very quick amount of time.  She has been taking a probiotic called good bili in hopes that that would help.  Migraine headaches-she had a status migrainous headache that lasted 11 days she said it finally broke over the weekend.  She does need a refill on her Relpax.  She is just not sure how well it is really working.   Past Medical History:  Diagnosis Date  . Anxiety   . Arthritis   . CVA (cerebral infarction)    Old CVA on  MRI brain  . Depression   . DVT (deep venous thrombosis) ([place]) 44/96/7591   L basilic vein   . Facet hypertrophy of lumbar region    MRI 2007  . Fibromyalgia   . GERD (gastroesophageal reflux disease)   . Hypertension    no meds now, lost 50lbs  . Migraines   . MS (multiple sclerosis) ([place])   . Neuromuscular disorder ([place])   . Obesity   . PTSD (post-traumatic stress disorder)   . Seizures ([place]) 2011   no seizure since onset  . Sleep apnea    diagnosed 20 ago, lost wt no CPAP now  . Smoking   . Stroke [facility])    doesn't know when    Past Surgical History:  Procedure Laterality Date  . CHOLECYSTECTOMY  10/2017  . CHONDROPLASTY Left 12/30/2014   Procedure: CHONDROPLASTY;  Surgeon: Leandrew Koyanagi, MD;  Location: [facility];  Service: Orthopedics;  Laterality: Left;  . KNEE ARTHROSCOPY Left 12/30/2014   Procedure: LEFT KNEE ARTHROSCOPY WITH   CHONDROPLASTY;  Surgeon: Leandrew Koyanagi, MD;  Location: [place];  Service: Orthopedics;  Laterality: Left;  . PARTIAL KNEE ARTHROPLASTY Left 08/03/2016   Procedure: LEFT UNICOMPARTMENTAL KNEE ARTHROPLASTY;  Surgeon: Leandrew Koyanagi, MD;  Location: [facility];  Service:  Orthopedics;  Laterality: Left;  . TONSILLECTOMY    . TUBAL LIGATION  1992    Family History  Problem Relation Age of Onset  . Cancer Mother 84       melanoma  . Hypertension Sister   . Heart disease Sister        valve disease  . Depression Sister   . Diabetes Sister   . Sjogren's syndrome Sister     Social History   Socioeconomic History  . Marital status: Single    Spouse name: Not on file  . Number of children: Not on file  . Years of education: Not on file  . Highest education level: Not on file  Occupational History  . Not on file  Social Needs  . Financial resource strain: Not on file  . Food insecurity    Worry: Not on file    Inability: Not on file  . Transportation needs    Medical: Not on file    Non-medical: Not on file   Tobacco Use  . Smoking status: Current Every Day Smoker    Packs/day: 1.00    Years: 25.00    Pack years: 25.00    Types: Cigarettes  . Smokeless tobacco: Never Used  . Tobacco comment: advised to d/c by 3 cigs per week.  Substance and Sexual Activity  . Alcohol use: No    Alcohol/week: 0.0 standard drinks    Comment: occasional  . Drug use: No  . Sexual activity: Yes    Birth control/protection: None, Post-menopausal  Lifestyle  . Physical activity    Days per week: Not on file    Minutes per session: Not on file  . Stress: Not on file  Relationships  . Social Herbalist on phone: Not on file    Gets together: Not on file    Attends religious service: Not on file    Active member of club or organization: Not on file    Attends meetings of clubs or organizations: Not on file    Relationship status: Not on file  . Intimate partner violence    Fear of current or ex partner: Not on file    Emotionally abused: Not on file    Physically abused: Not on file    Forced sexual activity: Not on file  Other Topics Concern  . Not on file  Social History Narrative  . Not on file    Outpatient Medications Prior to Visit  Medication Sig Dispense Refill  . acetaminophen (TYLENOL) 500 MG tablet Take 1,000 mg by mouth every 6 (six) hours as needed for mild pain or headache.    Marland Kitchen azelastine (ASTELIN) 0.1 % nasal spray Place 2 sprays into both nostrils 2 (two) times daily. Use in each nostril as directed 30 mL 12  . busPIRone (BUSPAR) 7.5 MG tablet TAKE 1 TABLET BY MOUTH EVERY DAY 90 tablet 1  . celecoxib (CELEBREX) 200 MG capsule TAKE 2 CAPSULES BY MOUTH AS NEEDED FOR PAIN 180 capsule 0  . clonazePAM (KLONOPIN) 1 MG tablet TAKE 1 TABLET BY MOUTH 2 (TWO) TIMES DAILY AS NEEDED FOR UP TO 30 DAYS FOR ANXIETY. NO EARLY REFILLS 60 tablet 3  . diclofenac sodium (VOLTAREN) 1 % GEL See admin instructions.    . DULoxetine (CYMBALTA) 30 MG capsule TAKE 1 CAPSULE BY MOUTH DAILY. THIS IS  IN ADDITION TO 60MG  TAKING DAILY. TOTAL DOSE BE 90 MG 30 capsule 0  . DULoxetine (CYMBALTA) 60 MG capsule  Take 1 capsule (60 mg total) by mouth daily. 30 capsule 0  . Erenumab-aooe (AIMOVIG) 140 MG/ML SOAJ Inject 140 mg into the skin every 30 (thirty) days. 1 pen 3  . famotidine (PEPCID) 20 MG tablet Take 1 tablet (20 mg total) by mouth daily. 90 tablet 3  . HYDROcodone-acetaminophen (NORCO) 10-325 MG tablet Take 1 tablet by mouth every 6 (six) hours as needed for pain. for pain  0  . omeprazole (PRILOSEC) 40 MG capsule Take 1 capsule (40 mg total) by mouth daily. 90 capsule 3  . ondansetron (ZOFRAN) 4 MG tablet TAKE 1 TO 2 TABLET BY MOUTH EVERY 8 HOURS IF NEEDED FOR NAUSEA AND VOMITING 30 tablet 1  . traZODone (DESYREL) 50 MG tablet Take 0.5-2 tablets (25-100 mg total) by mouth at bedtime as needed for sleep. 45 tablet 3  . Triamcinolone Acetonide (TRIAMCINOLONE 0.1 % CREAM : EUCERIN) CREA Apply 1 application topically at bedtime. 1 Jar. 1 each 1  . varenicline (CHANTIX CONTINUING MONTH PAK) 1 MG tablet Take 1 tablet (1 mg total) by mouth 2 (two) times daily. 60 tablet 0  . Vitamin D, Ergocalciferol, (DRISDOL) 1.25 MG (50000 UT) CAPS capsule TAKE 1 CAPSULE (50,000 UNITS TOTAL) BY MOUTH EVERY 7 (SEVEN) DAYS. 12 capsule 0  . eletriptan (RELPAX) 40 MG tablet Take 1 tablet (40 mg total) by mouth as needed for migraine or headache. May repeat in 2 hours if headache persists or recurs. 10 tablet 0  . Liraglutide -Weight Management (SAXENDA) 18 MG/3ML SOPN Inject 0.6 mg into the skin daily for 7 days, THEN 1.2 mg daily for 7 days, THEN 1.8 mg daily for 16 days. (Patient not taking: Reported on 11/27/2018) 1 pen 0  . Naltrexone-buPROPion HCl ER (CONTRAVE) 8-90 MG TB12 Take 1 tablet by mouth 2 (two) times a day. (Patient not taking: Reported on 11/27/2018) 120 tablet 1  . predniSONE (DELTASONE) 10 MG tablet 8 tabs PO day 1, 6 tabs day 2, 4 tabs day 3, 2 tabs day 4, 1 tab day 5. 21 tablet 0   No  facility-administered medications prior to visit.     Allergies  Allergen Reactions  . Ace Inhibitors     REACTION: cough  . Atenolol Other (See Comments)    Bradycardia  . Celexa [Citalopram Hydrobromide] Other (See Comments)    Dizziness Tolerated Lexapro   . Codeine Nausea Only  . Nitroglycerin Other (See Comments)    Drop in BP  . Phenytoin Swelling  . Sertraline Other (See Comments)    unknown  . Sumatriptan Nausea Only  . Topamax [Topiramate] Other (See Comments)    She reports that this makes her forgetful and causes her to studder  . Triamcinolone     Steroid flare after joint injection, use other steroid for injections    ROS Review of Systems    Objective:    Physical Exam  Constitutional: She is oriented to person, place, and time. She appears well-developed and well-nourished.  HENT:  Head: Normocephalic and atraumatic.  Cardiovascular: Normal rate, regular rhythm and normal heart sounds.  Pulmonary/Chest: Effort normal and breath sounds normal.  Neurological: She is alert and oriented to person, place, and time.  Skin: Skin is warm and dry.  Psychiatric: She has a normal mood and affect. Her behavior is normal.    BP 140/78 (BP Location: Right Arm, Cuff Size: Large)   Pulse 87   Ht 5\' 6"  (1.676 m)   Wt 281 lb (127.5 kg)  LMP 12/28/2014   SpO2 95%   BMI 45.35 kg/m  Wt Readings from Last 3 Encounters:  11/27/18 281 lb (127.5 kg)  10/03/18 265 lb (120.2 kg)  07/30/18 265 lb (120.2 kg)     There are no preventive care reminders to display for this patient.  There are no preventive care reminders to display for this patient.  Lab Results  Component Value Date   TSH 0.53 02/15/2018   Lab Results  Component Value Date   WBC 7.2 10/21/2018   HGB 14.3 10/21/2018   HCT 43.8 10/21/2018   MCV 88.0 10/21/2018   PLT 267 10/21/2018   Lab Results  Component Value Date   NA 141 06/21/2018   K 3.7 06/21/2018   CO2 26 06/21/2018   GLUCOSE 89  06/21/2018   BUN 9 06/21/2018   CREATININE 0.80 06/21/2018   BILITOT 0.3 06/21/2018   ALKPHOS 98 11/29/2016   AST 17 06/21/2018   ALT 9 06/21/2018   PROT 6.8 06/21/2018   ALBUMIN 3.6 11/29/2016   CALCIUM 9.4 06/21/2018   ANIONGAP 11 08/04/2016   Lab Results  Component Value Date   CHOL 177 07/22/2018   Lab Results  Component Value Date   HDL 59 07/22/2018   Lab Results  Component Value Date   LDLCALC 109 (H) 06/21/2018   Lab Results  Component Value Date   TRIG 106 07/22/2018   Lab Results  Component Value Date   CHOLHDL 3.9 06/21/2018   Lab Results  Component Value Date   HGBA1C 5.6 06/21/2018      Assessment & Plan:   Problem List Items Addressed This Visit      Cardiovascular and Mediastinum   Migraine without aura    We will try switching to Maxalt melts and see if it works better than the Relpax.  New prescription sent to local pharmacy.      Relevant Medications   rizatriptan (MAXALT-MLT) 10 MG disintegrating tablet     Other   BMI 45.0-49.9, adult ([place])    She has had a very rapid weight gain over over very short period of time.  We discussed maybe even checking her thyroid levels.  Last one was normal last October which was less than a year ago but I would be more than happy to do some additional labs.  She says she was to try the phentermine for a month and then if she has not been able to lose weight then we can consider doing some blood work at that time.      Relevant Medications   phentermine 15 MG capsule   Adrenal mass ([place])    Indeterminate nodule noted on scan from August 2019 in care everywhere.  Recommend repeat ultrasound this year in August just to make sure that it is not changing or getting larger.  Likely adenoma.      Relevant Orders   US Renal   Abnormal weight gain - Primary    Discussed options.  Right now are still trying to get the Contrave approved with her insurance we can go ahead and start with phentermine did warn  about potential side effects including elevating blood pressure and causing chest pain or palpitations.  She is to monitor for any the symptoms and stop immediately if she experiences them.  We will start with 15 mg and follow back up in 4 weeks.  Current weight: 281 pounds Previous weight: 265 lbs Weight change: up 16 lbs.  Nutrition goal: Eating more  than once a day, increasing protein.  Using a protein supplemental drink for breakfast. Exercise goal: Increase activity level. Medication: Phentermine 15 mg Follow-up: 4 weeks      Abdominal pain, chronic, epigastric    Recommend may be going back to a trial of fiber supplements that she seems to have frequent watery diarrhea but at the same time feels like she is not moving her bowels properly.  I think it is worth going back and trying either the Metamucil or FiberCon twice a day.  She is doing a probiotic as well encouraged her to also get back in with GI.  What she is describing sounds like it could also be gastroparesis related and so I do think it needs to be worked up further.         Meds ordered this encounter  Medications  . phentermine 15 MG capsule    Sig: Take 1 capsule (15 mg total) by mouth every morning.    Dispense:  30 capsule    Refill:  0  . rizatriptan (MAXALT-MLT) 10 MG disintegrating tablet    Sig: Take 1 tablet (10 mg total) by mouth daily as needed. May repeat in 2 hours if needed    Dispense:  10 tablet    Refill:  4    Follow-up: Return in about 4 weeks (around 12/25/2018) for Medication .    Beatrice Lecher, MD

## 2018-11-28 ENCOUNTER — Ambulatory Visit (HOSPITAL_COMMUNITY): Payer: Medicare Other | Admitting: Licensed Clinical Social Worker

## 2018-11-28 NOTE — Telephone Encounter (Signed)
Information has been filled out and sent to [facility]. Waiting on a response.

## 2018-11-29 ENCOUNTER — Other Ambulatory Visit (HOSPITAL_COMMUNITY): Payer: Self-pay

## 2018-11-29 MED ORDER — DULOXETINE HCL 30 MG PO CPEP: ORAL_CAPSULE | ORAL | 0 refills | Status: DC

## 2018-11-30 ENCOUNTER — Encounter: Payer: Self-pay | Admitting: Family Medicine

## 2018-12-02 ENCOUNTER — Encounter: Payer: Self-pay | Admitting: Family Medicine

## 2018-12-02 ENCOUNTER — Telehealth (INDEPENDENT_AMBULATORY_CARE_PROVIDER_SITE_OTHER): Payer: Medicare Other | Admitting: Family Medicine

## 2018-12-02 ENCOUNTER — Other Ambulatory Visit: Payer: Self-pay

## 2018-12-02 ENCOUNTER — Ambulatory Visit (INDEPENDENT_AMBULATORY_CARE_PROVIDER_SITE_OTHER): Payer: Medicare Other

## 2018-12-02 VITALS — Temp 97.7°F

## 2018-12-02 DIAGNOSIS — J0110 Acute frontal sinusitis, unspecified: Secondary | ICD-10-CM | POA: Diagnosis not present

## 2018-12-02 DIAGNOSIS — E278 Other specified disorders of adrenal gland: Secondary | ICD-10-CM

## 2018-12-02 MED ORDER — AMOXICILLIN-POT CLAVULANATE 875-125 MG PO TABS: 1.0000 | ORAL_TABLET | Freq: Two times a day (BID) | ORAL | 0 refills | Status: DC

## 2018-12-02 NOTE — Progress Notes (Signed)
Pt reports that cough began Friday. Sinuses have been draining noticed some blood and yellow mucous when blowing her nose. Denies f/s/c/n/v .Ashley Gomez, [place]

## 2018-12-02 NOTE — Progress Notes (Signed)
Virtual Visit via Video Note  I connected with Ashley Gomez on 12/02/18 at  2:40 PM EDT by a video enabled telemedicine application and verified that I am speaking with the correct person using two identifiers.   I discussed the limitations of evaluation and management by telemedicine and the availability of in person appointments. The patient expressed understanding and agreed to proceed.  Subjective:    CC: Cough   HPI: Pt reports that cough began Friday. Cough is more dry.  Sinuses have been draining noticed some blood and yellow mucous when blowing her nose. No ST.  No GI sxs. pressure in her sinuses and in her forehead. [place]. No known exposure with COVID.  Denies f/s/c/n/v  Tested negative for COVID about 2 weeks ago at [facility] and at the [facility].   Past medical history, Surgical history, Family history not pertinant except as noted below, Social history, Allergies, and medications have been entered into the medical record, reviewed, and corrections made.   Review of Systems: No fevers, chills, night sweats, weight loss, chest pain, or shortness of breath.   Objective:    General: Speaking clearly in complete sentences without any shortness of breath.  Alert and oriented x3.  Normal judgment. No apparent acute distress. Pointing under her cheeks and her forehead for where she has pressure in her face.     Impression and Recommendations:    Acute sinusitis - likely bacterial though sxs for only 4-5 days.  Will tx with Agumentin. Call if not better in one-week. Call if any new sxs or fever or worsening cough or new onset SOB>        I discussed the assessment and treatment plan with the patient. The patient was provided an opportunity to ask questions and all were answered. The patient agreed with the plan and demonstrated an understanding of the instructions.   The patient was advised to call back or seek an in-person evaluation if the symptoms worsen or  if the condition fails to improve as anticipated.   Beatrice Lecher, MD

## 2018-12-06 NOTE — Telephone Encounter (Signed)
Received mail from [place] [employer] that Contrave is not covered under the patient plan for weight loss.

## 2018-12-12 DIAGNOSIS — G8929 Other chronic pain: Secondary | ICD-10-CM | POA: Diagnosis not present

## 2018-12-12 DIAGNOSIS — G43909 Migraine, unspecified, not intractable, without status migrainosus: Secondary | ICD-10-CM | POA: Diagnosis not present

## 2018-12-12 DIAGNOSIS — M545 Low back pain: Secondary | ICD-10-CM | POA: Diagnosis not present

## 2018-12-12 DIAGNOSIS — Z1159 Encounter for screening for other viral diseases: Secondary | ICD-10-CM | POA: Diagnosis not present

## 2018-12-12 DIAGNOSIS — Z79899 Other long term (current) drug therapy: Secondary | ICD-10-CM | POA: Diagnosis not present

## 2018-12-12 DIAGNOSIS — J309 Allergic rhinitis, unspecified: Secondary | ICD-10-CM | POA: Diagnosis not present

## 2018-12-13 ENCOUNTER — Other Ambulatory Visit: Payer: Self-pay | Admitting: Family Medicine

## 2018-12-23 ENCOUNTER — Ambulatory Visit (HOSPITAL_COMMUNITY): Payer: Medicare Other | Admitting: Licensed Clinical Social Worker

## 2018-12-24 ENCOUNTER — Ambulatory Visit (INDEPENDENT_AMBULATORY_CARE_PROVIDER_SITE_OTHER): Payer: Medicare Other | Admitting: Psychiatry

## 2018-12-24 ENCOUNTER — Encounter (HOSPITAL_COMMUNITY): Payer: Self-pay | Admitting: Psychiatry

## 2018-12-24 ENCOUNTER — Other Ambulatory Visit: Payer: Self-pay

## 2018-12-24 DIAGNOSIS — F331 Major depressive disorder, recurrent, moderate: Secondary | ICD-10-CM | POA: Diagnosis not present

## 2018-12-24 DIAGNOSIS — F4310 Post-traumatic stress disorder, unspecified: Secondary | ICD-10-CM | POA: Diagnosis not present

## 2018-12-24 DIAGNOSIS — Z634 Disappearance and death of family member: Secondary | ICD-10-CM

## 2018-12-24 DIAGNOSIS — F4329 Adjustment disorder with other symptoms: Secondary | ICD-10-CM

## 2018-12-24 DIAGNOSIS — F411 Generalized anxiety disorder: Secondary | ICD-10-CM | POA: Diagnosis not present

## 2018-12-24 DIAGNOSIS — F4321 Adjustment disorder with depressed mood: Secondary | ICD-10-CM

## 2018-12-24 MED ORDER — DULOXETINE HCL 30 MG PO CPEP: ORAL_CAPSULE | ORAL | 0 refills | Status: DC

## 2018-12-24 MED ORDER — BUSPIRONE HCL 7.5 MG PO TABS: 7.5000 mg | ORAL_TABLET | Freq: Every day | ORAL | 0 refills | Status: DC
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
## 2018-12-26 ENCOUNTER — Ambulatory Visit (HOSPITAL_COMMUNITY): Payer: Medicare Other | Admitting: Licensed Clinical Social Worker

## 2018-12-26 ENCOUNTER — Ambulatory Visit (INDEPENDENT_AMBULATORY_CARE_PROVIDER_SITE_OTHER): Payer: Medicare Other | Admitting: Family Medicine

## 2018-12-26 ENCOUNTER — Other Ambulatory Visit: Payer: Self-pay

## 2018-12-26 ENCOUNTER — Encounter: Payer: Self-pay | Admitting: Family Medicine

## 2018-12-26 VITALS — BP 134/73 | HR 87 | Ht 66.0 in | Wt 282.0 lb

## 2018-12-26 DIAGNOSIS — R635 Abnormal weight gain: Secondary | ICD-10-CM

## 2018-12-26 DIAGNOSIS — R05 Cough: Secondary | ICD-10-CM

## 2018-12-26 DIAGNOSIS — J320 Chronic maxillary sinusitis: Secondary | ICD-10-CM

## 2018-12-26 DIAGNOSIS — R059 Cough, unspecified: Secondary | ICD-10-CM

## 2018-12-26 DIAGNOSIS — Z6841 Body Mass Index (BMI) 40.0 and over, adult: Secondary | ICD-10-CM | POA: Diagnosis not present

## 2018-12-26 MED ORDER — LEVOFLOXACIN 500 MG PO TABS: 500.0000 mg | ORAL_TABLET | Freq: Every day | ORAL | 0 refills | Status: AC

## 2018-12-26 MED ORDER — PHENTERMINE HCL 37.5 MG PO CAPS: 37.5000 mg | ORAL_CAPSULE | ORAL | 0 refills | Status: DC

## 2018-12-26 NOTE — Progress Notes (Signed)
Patient doing well on appetite suppressant. Here for F/U visit, weight, BP, HR check. Denies problems with insomnia, heart palpitations or tremors. She stated that she really hasn't noticed a difference while on the medication. She continues to work on [employer] and regular exercise.  Maryruth Eve, Lahoma Crocker, CMA

## 2018-12-26 NOTE — Assessment & Plan Note (Signed)
Will inc phentermine dose. Continue to work on [employer] and exercise. Due to check thyroid soon. F/U in 1 mo for nurse visit.

## 2018-12-26 NOTE — Progress Notes (Signed)
Established Patient Office Visit  Subjective:  Patient ID: Ashley Gomez, female    DOB: [birth year]  Age: 48 y.o. MRN: [id number]  CC:  Chief Complaint  Patient presents with  . ABNORMAL WEIGHT GAIN  . Cough    she reports that she continues to experience a cough from sinus drainage and that this is worse at night. she has been tested several times for covid and all were negative.    HPI Ashley Gomez presents for  Patient doing well on appetite suppressant. Here for F/U visit, weight, BP, HR check. Denies problems with insomnia, heart palpitations or tremors. She stated that she really hasn't noticed a difference while on the medication. She continues to work on [employer] and regular exercise. She has gained a pound.  She hs been eating smaller portions and she has been drinking water. She has been drinking a premier protein drink in the AM.  She is on 15 mg phentermine.   Cough - she reports that she continues to experience a cough from sinus drainage and that this is worse at night. she has been tested several times for covid and all were negative. No fever or chills. Having some bilateral maxillary pain.  Has been going on for 3 months now.  Pressure in her face is worse at night when she lays down and when she sits up firt thing in the AM.  She took a round of Augmentin around 12/06/18.    She has still been having LLQ PAIN  And now some left mid back pain.    Migraines - she hasn't had another migraine since about 3 week ago but just having the mild HA and facial pressure. Wonder if her Aimovig needs to be adjusted.    Past Medical History:  Diagnosis Date  . Anxiety   . Arthritis   . CVA (cerebral infarction)    Old CVA on MRI brain  . Depression   . DVT (deep venous thrombosis) ([place]) 49/67/5916   L basilic vein   . Facet hypertrophy of lumbar region    MRI 2007  . Fibromyalgia   . GERD (gastroesophageal reflux disease)   . Hypertension    no meds now, lost 50lbs   . Migraines   . MS (multiple sclerosis) ([place])   . Neuromuscular disorder ([place])   . Obesity   . PTSD (post-traumatic stress disorder)   . Seizures ([place]) 2011   no seizure since onset  . Sleep apnea    diagnosed 20 ago, lost wt no CPAP now  . Smoking   . Stroke [facility])    doesn't know when    Past Surgical History:  Procedure Laterality Date  . CHOLECYSTECTOMY  10/2017  . CHONDROPLASTY Left 12/30/2014   Procedure: CHONDROPLASTY;  Surgeon: Leandrew Koyanagi, MD;  Location: [place];  Service: Orthopedics;  Laterality: Left;  . KNEE ARTHROSCOPY Left 12/30/2014   Procedure: LEFT KNEE ARTHROSCOPY WITH   CHONDROPLASTY;  Surgeon: Leandrew Koyanagi, MD;  Location: [place];  Service: Orthopedics;  Laterality: Left;  . PARTIAL KNEE ARTHROPLASTY Left 08/03/2016   Procedure: LEFT UNICOMPARTMENTAL KNEE ARTHROPLASTY;  Surgeon: Leandrew Koyanagi, MD;  Location: [place];  Service: Orthopedics;  Laterality: Left;  . TONSILLECTOMY    . TUBAL LIGATION  1992    Family History  Problem Relation Age of Onset  . Cancer Mother 58       melanoma  . Hypertension Sister   .  Heart disease Sister        valve disease  . Depression Sister   . Diabetes Sister   . Sjogren's syndrome Sister     Social History   Socioeconomic History  . Marital status: Single    Spouse name: Not on file  . Number of children: Not on file  . Years of education: Not on file  . Highest education level: Not on file  Occupational History  . Not on file  Social Needs  . Financial resource strain: Not on file  . Food insecurity    Worry: Not on file    Inability: Not on file  . Transportation needs    Medical: Not on file    Non-medical: Not on file  Tobacco Use  . Smoking status: Current Every Day Smoker    Packs/day: 1.00    Years: 25.00    Pack years: 25.00    Types: Cigarettes  . Smokeless tobacco: Never Used  . Tobacco comment: advised to d/c by 3 cigs per week.  Substance and Sexual  Activity  . Alcohol use: No    Alcohol/week: 0.0 standard drinks    Comment: occasional  . Drug use: No  . Sexual activity: Yes    Birth control/protection: None, Post-menopausal  Lifestyle  . Physical activity    Days per week: Not on file    Minutes per session: Not on file  . Stress: Not on file  Relationships  . Social Herbalist on phone: Not on file    Gets together: Not on file    Attends religious service: Not on file    Active member of club or organization: Not on file    Attends meetings of clubs or organizations: Not on file    Relationship status: Not on file  . Intimate partner violence    Fear of current or ex partner: Not on file    Emotionally abused: Not on file    Physically abused: Not on file    Forced sexual activity: Not on file  Other Topics Concern  . Not on file  Social History Narrative  . Not on file    Outpatient Medications Prior to Visit  Medication Sig Dispense Refill  . acetaminophen (TYLENOL) 500 MG tablet Take 1,000 mg by mouth every 6 (six) hours as needed for mild pain or headache.    Marland Kitchen atorvastatin (LIPITOR) 20 MG tablet Take 1 tablet by mouth at bedtime.    Marland Kitchen azelastine (ASTELIN) 0.1 % nasal spray Place 2 sprays into both nostrils 2 (two) times daily. Use in each nostril as directed 30 mL 12  . busPIRone (BUSPAR) 7.5 MG tablet Take 1 tablet (7.5 mg total) by mouth daily. 90 tablet 0  . celecoxib (CELEBREX) 200 MG capsule TAKE 2 CAPSULES BY MOUTH AS NEEDED FOR PAIN 180 capsule 0  . CHANTIX 1 MG tablet TAKE 1 TABLET BY MOUTH TWICE A DAY 60 tablet 0  . clonazePAM (KLONOPIN) 1 MG tablet TAKE 1 TABLET BY MOUTH 2 (TWO) TIMES DAILY AS NEEDED FOR UP TO 30 DAYS FOR ANXIETY. NO EARLY REFILLS 60 tablet 3  . diclofenac sodium (VOLTAREN) 1 % GEL See admin instructions.    . DULoxetine (CYMBALTA) 30 MG capsule TAKE 1 CAPSULE BY MOUTH DAILY. THIS IS IN ADDITION TO 60MG  TAKING DAILY. TOTAL DOSE BE 90 MG 30 capsule 0  . DULoxetine (CYMBALTA)  60 MG capsule Take 1 capsule (60 mg total) by mouth daily. [place]  capsule 0  . Erenumab-aooe (AIMOVIG) 140 MG/ML SOAJ Inject 140 mg into the skin every 30 (thirty) days. 1 pen 3  . famotidine (PEPCID) 20 MG tablet Take 1 tablet (20 mg total) by mouth daily. 90 tablet 3  . HYDROcodone-acetaminophen (NORCO) 10-325 MG tablet Take 1 tablet by mouth every 6 (six) hours as needed for pain. for pain  0  . levocetirizine (XYZAL) 5 MG tablet Take 5 mg by mouth every evening.    . metoprolol tartrate (LOPRESSOR) 25 MG tablet Take 1 tablet by mouth daily.    Marland Kitchen omeprazole (PRILOSEC) 40 MG capsule Take 1 capsule (40 mg total) by mouth daily. 90 capsule 3  . ondansetron (ZOFRAN) 4 MG tablet TAKE 1 TO 2 TABLET BY MOUTH EVERY 8 HOURS IF NEEDED FOR NAUSEA AND VOMITING 30 tablet 1  . rizatriptan (MAXALT-MLT) 10 MG disintegrating tablet Take 1 tablet (10 mg total) by mouth daily as needed. May repeat in 2 hours if needed 10 tablet 4  . Triamcinolone Acetonide (TRIAMCINOLONE 0.1 % CREAM : EUCERIN) CREA Apply 1 application topically at bedtime. 1 Jar. 1 each 1  . Vitamin D, Ergocalciferol, (DRISDOL) 1.25 MG (50000 UT) CAPS capsule TAKE 1 CAPSULE (50,000 UNITS TOTAL) BY MOUTH EVERY 7 (SEVEN) DAYS. 12 capsule 0  . amoxicillin-clavulanate (AUGMENTIN) 875-125 MG tablet Take 1 tablet by mouth 2 (two) times daily. 20 tablet 0  . phentermine 15 MG capsule Take 1 capsule (15 mg total) by mouth every morning. 30 capsule 0  . traZODone (DESYREL) 50 MG tablet Take 0.5-2 tablets (25-100 mg total) by mouth at bedtime as needed for sleep. 45 tablet 3   No facility-administered medications prior to visit.     Allergies  Allergen Reactions  . Ace Inhibitors     REACTION: cough  . Atenolol Other (See Comments)    Bradycardia  . Celexa [Citalopram Hydrobromide] Other (See Comments)    Dizziness Tolerated Lexapro   . Codeine Nausea Only  . Nitroglycerin Other (See Comments)    Drop in BP  . Phenytoin Swelling  . Sertraline  Other (See Comments)    unknown  . Sumatriptan Nausea Only  . Topamax [Topiramate] Other (See Comments)    She reports that this makes her forgetful and causes her to studder  . Triamcinolone     Steroid flare after joint injection, use other steroid for injections    ROS Review of Systems    Objective:    Physical Exam  Constitutional: She is oriented to person, place, and time. She appears well-developed and well-nourished.  HENT:  Head: Normocephalic and atraumatic.  Right Ear: External ear normal.  Left Ear: External ear normal.  Nose: Nose normal.  TMs and canals are clear.   Eyes: Pupils are equal, round, and reactive to light. Conjunctivae and EOM are normal.  Neck: Neck supple. No thyromegaly present.  Cardiovascular: Normal rate, regular rhythm and normal heart sounds.  Pulmonary/Chest: Effort normal and breath sounds normal. She has no wheezes.  Lymphadenopathy:    She has no cervical adenopathy.  Neurological: She is alert and oriented to person, place, and time.  Skin: Skin is warm and dry.  Psychiatric: She has a normal mood and affect.    BP 134/73   Pulse 87   Ht 5\' 6"  (1.676 m)   Wt 282 lb (127.9 kg)   LMP 12/28/2014   SpO2 97%   BMI 45.52 kg/m  Wt Readings from Last 3 Encounters:  12/26/18 282 lb (127.9  kg)  11/27/18 281 lb (127.5 kg)  10/03/18 265 lb (120.2 kg)     Health Maintenance Due  Topic Date Due  . COLONOSCOPY  12/13/2018  . INFLUENZA VACCINE  12/14/2018    There are no preventive care reminders to display for this patient.  Lab Results  Component Value Date   TSH 0.53 02/15/2018   Lab Results  Component Value Date   WBC 7.2 10/21/2018   HGB 14.3 10/21/2018   HCT 43.8 10/21/2018   MCV 88.0 10/21/2018   PLT 267 10/21/2018   Lab Results  Component Value Date   NA 141 06/21/2018   K 3.7 06/21/2018   CO2 26 06/21/2018   GLUCOSE 89 06/21/2018   BUN 9 06/21/2018   CREATININE 0.80 06/21/2018   BILITOT 0.3 06/21/2018    ALKPHOS 98 11/29/2016   AST 17 06/21/2018   ALT 9 06/21/2018   PROT 6.8 06/21/2018   ALBUMIN 3.6 11/29/2016   CALCIUM 9.4 06/21/2018   ANIONGAP 11 08/04/2016   Lab Results  Component Value Date   CHOL 177 07/22/2018   Lab Results  Component Value Date   HDL 59 07/22/2018   Lab Results  Component Value Date   LDLCALC 109 (H) 06/21/2018   Lab Results  Component Value Date   TRIG 106 07/22/2018   Lab Results  Component Value Date   CHOLHDL 3.9 06/21/2018   Lab Results  Component Value Date   HGBA1C 5.6 06/21/2018      Assessment & Plan:   Problem List Items Addressed This Visit      Other   BMI 45.0-49.9, adult ([place])    Will inc phentermine dose. Continue to work on [employer] and exercise. Due to check thyroid soon. F/U in 1 mo for nurse visit.       Relevant Medications   phentermine 37.5 MG capsule   Abnormal weight gain - Primary    Will inc phentermine dose. Continue to work on [employer] and exercise. Due to check thyroid soon. F/U in 1 mo for nurse visit.        Other Visit Diagnoses    Cough       Chronic maxillary sinusitis       Relevant Medications   levofloxacin (LEVAQUIN) 500 MG tablet     Chronic sinusitis, maxillary - will tx with FQ, levaquin x 3 weeks. If not improving after 5-7 days then consider CT scan of sinuses.    Meds ordered this encounter  Medications  . phentermine 37.5 MG capsule    Sig: Take 1 capsule (37.5 mg total) by mouth every morning.    Dispense:  30 capsule    Refill:  0  . levofloxacin (LEVAQUIN) 500 MG tablet    Sig: Take 1 tablet (500 mg total) by mouth daily for 10 days.    Dispense:  21 tablet    Refill:  0    Follow-up: Return in about 4 weeks (around 01/23/2019) for weight check with nurse.    Beatrice Lecher, MD

## 2018-12-30 ENCOUNTER — Other Ambulatory Visit: Payer: Self-pay | Admitting: Family Medicine

## 2019-01-05 ENCOUNTER — Other Ambulatory Visit: Payer: Self-pay | Admitting: Family Medicine

## 2019-01-09 DIAGNOSIS — M792 Neuralgia and neuritis, unspecified: Secondary | ICD-10-CM | POA: Diagnosis not present

## 2019-01-09 DIAGNOSIS — M545 Low back pain: Secondary | ICD-10-CM | POA: Diagnosis not present

## 2019-01-09 DIAGNOSIS — G8929 Other chronic pain: Secondary | ICD-10-CM | POA: Diagnosis not present

## 2019-01-09 DIAGNOSIS — Z79899 Other long term (current) drug therapy: Secondary | ICD-10-CM | POA: Diagnosis not present

## 2019-01-09 DIAGNOSIS — Z1159 Encounter for screening for other viral diseases: Secondary | ICD-10-CM | POA: Diagnosis not present

## 2019-01-10 ENCOUNTER — Other Ambulatory Visit: Payer: Self-pay

## 2019-01-10 ENCOUNTER — Encounter: Payer: Self-pay | Admitting: Sports Medicine

## 2019-01-10 ENCOUNTER — Ambulatory Visit (INDEPENDENT_AMBULATORY_CARE_PROVIDER_SITE_OTHER): Payer: Medicare Other

## 2019-01-10 ENCOUNTER — Ambulatory Visit (INDEPENDENT_AMBULATORY_CARE_PROVIDER_SITE_OTHER): Payer: Medicare Other | Admitting: Sports Medicine

## 2019-01-10 DIAGNOSIS — G35D Multiple sclerosis, unspecified: Secondary | ICD-10-CM

## 2019-01-10 DIAGNOSIS — M7062 Trochanteric bursitis, left hip: Secondary | ICD-10-CM

## 2019-01-10 DIAGNOSIS — M25512 Pain in left shoulder: Secondary | ICD-10-CM

## 2019-01-10 DIAGNOSIS — M1612 Unilateral primary osteoarthritis, left hip: Secondary | ICD-10-CM | POA: Diagnosis not present

## 2019-01-10 DIAGNOSIS — G894 Chronic pain syndrome: Secondary | ICD-10-CM | POA: Diagnosis not present

## 2019-01-10 DIAGNOSIS — G8929 Other chronic pain: Secondary | ICD-10-CM

## 2019-01-10 DIAGNOSIS — G35 Multiple sclerosis: Secondary | ICD-10-CM

## 2019-01-10 DIAGNOSIS — M170 Bilateral primary osteoarthritis of knee: Secondary | ICD-10-CM | POA: Diagnosis not present

## 2019-01-10 IMAGING — DX DG HIP (WITH OR WITHOUT PELVIS) 2-3V LEFT
3 series · 3 of 3 positions shown · non-contrast
Comparison: [DATE]

CLINICAL DATA: Pain over the left hip

EXAM:
DG HIP (WITH OR WITHOUT PELVIS) 2-3V LEFT

[pelvis ap]
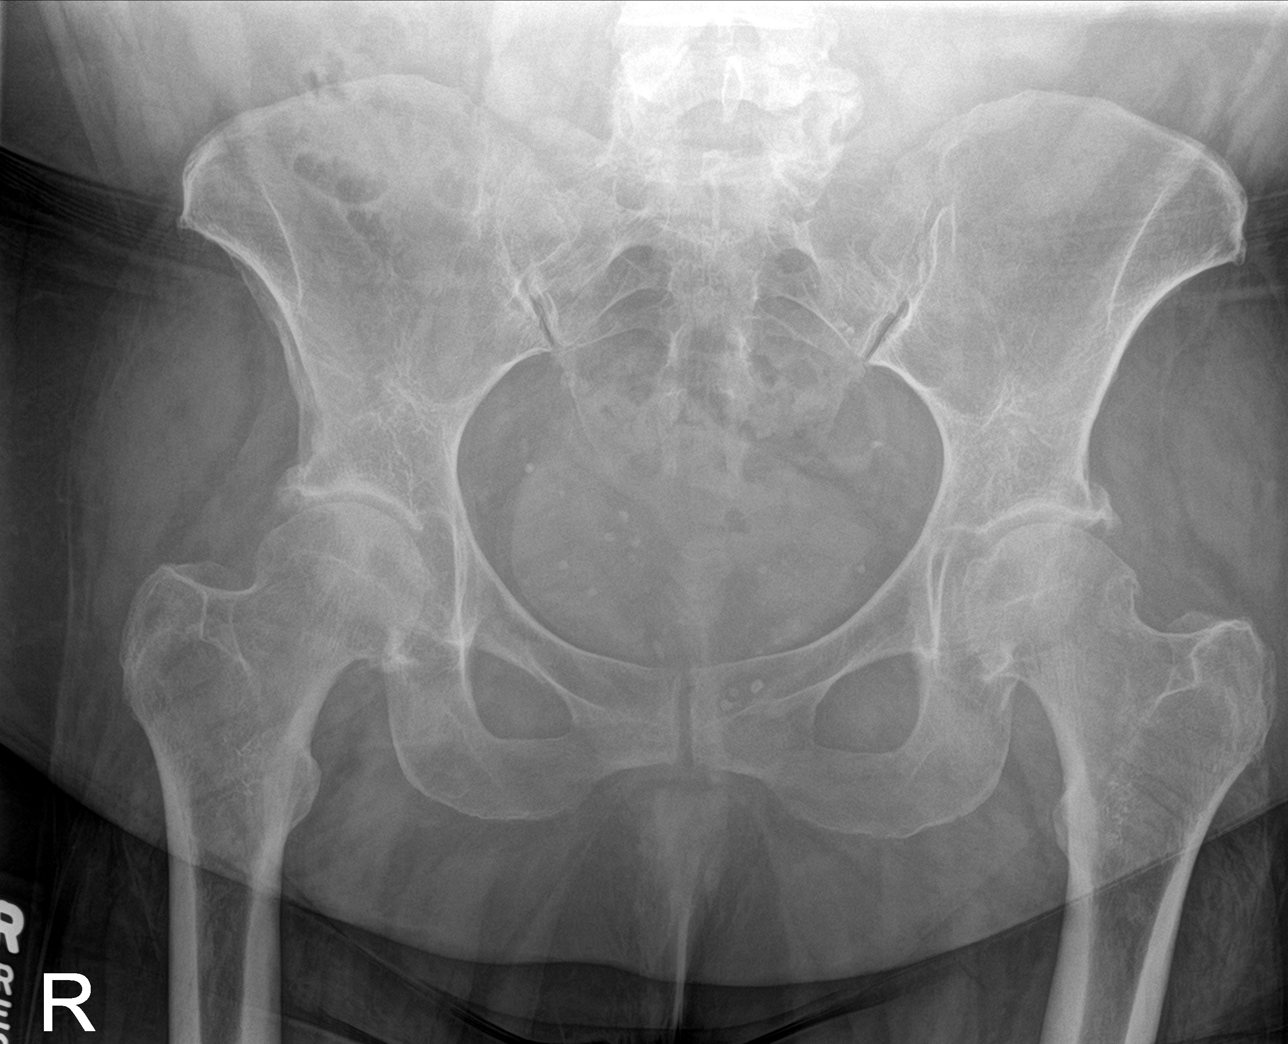

[hip ap]
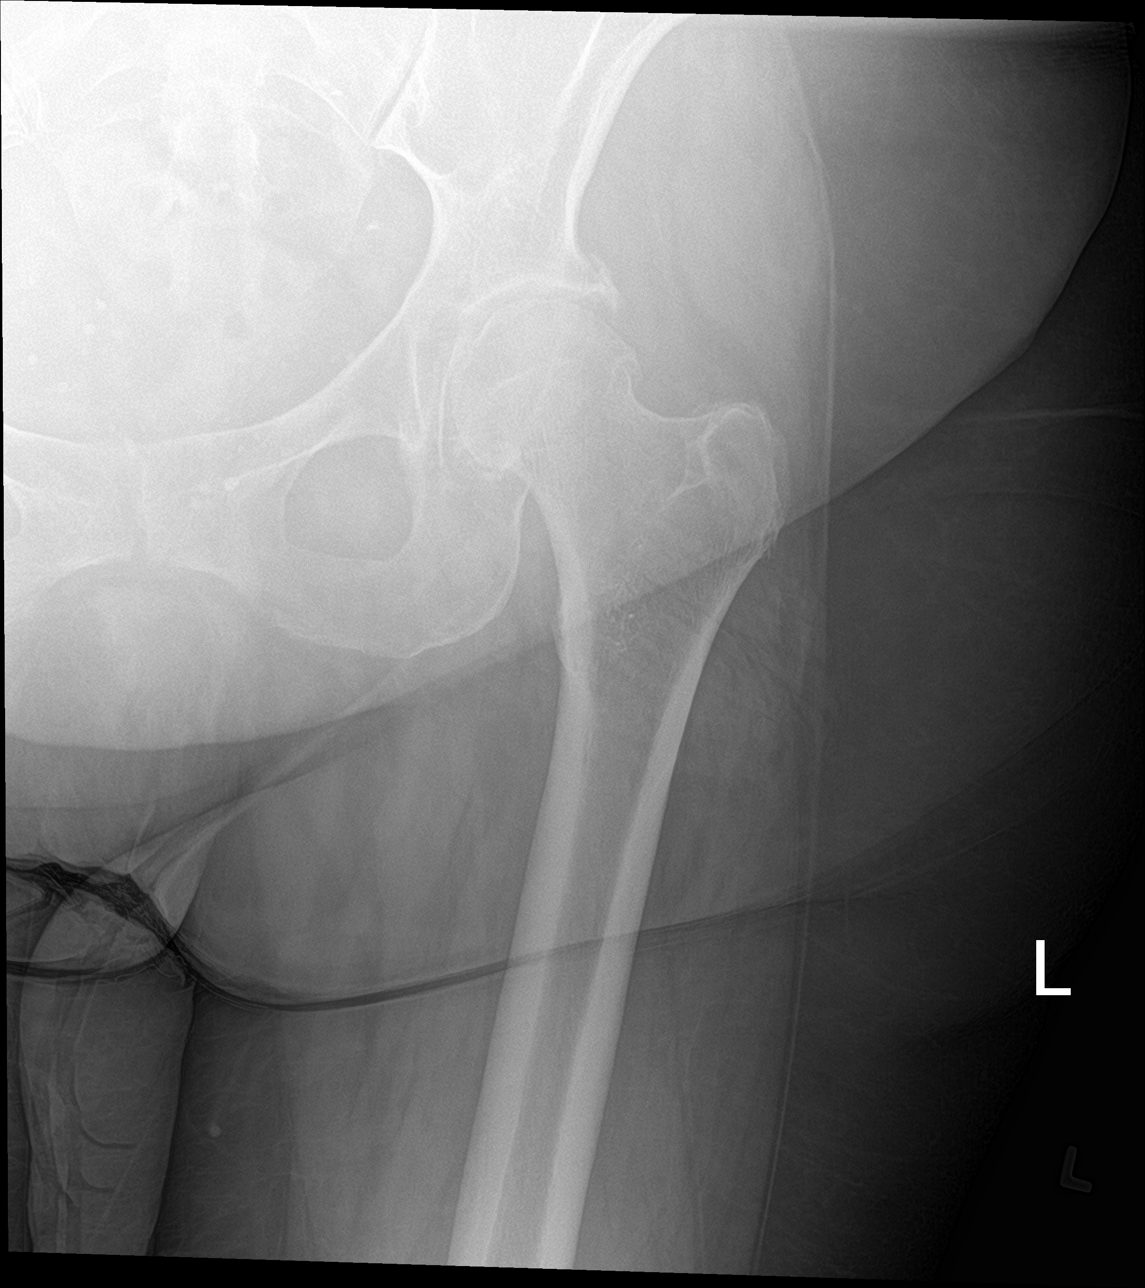

[hip lat]
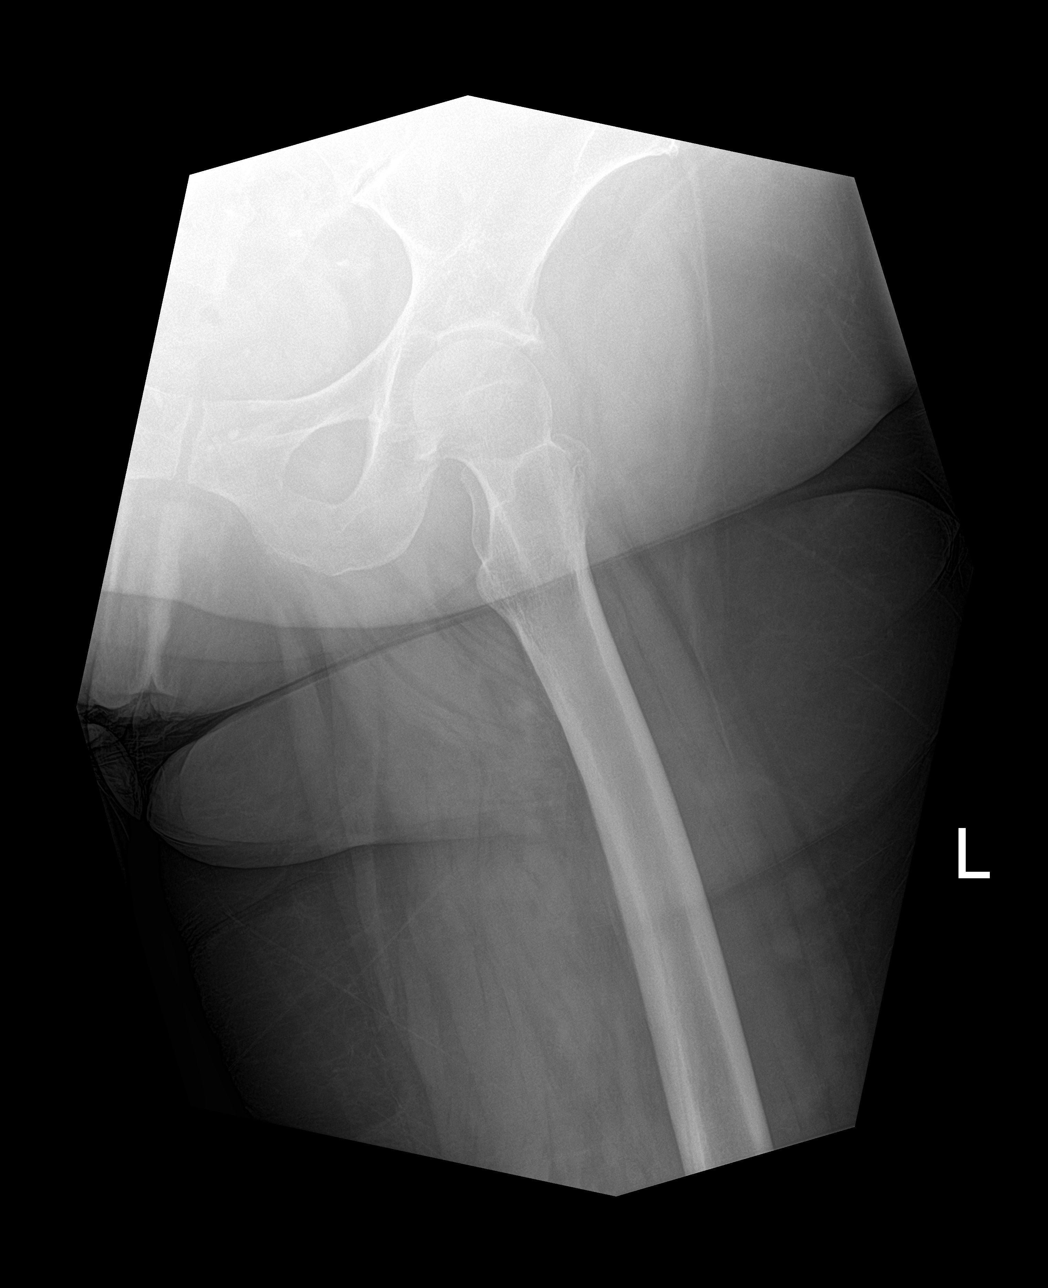

[3 of 3 positions shown; findings below may reference images not displayed]

FINDINGS: SI joints are non widened. Multiple phleboliths in the pelvis. Pubic
symphysis and rami appear intact. No fracture or malalignment. Mild
degenerative change of both hips.
IMPRESSION: Mild degenerative change.  No acute osseous abnormality

## 2019-01-10 NOTE — Assessment & Plan Note (Signed)
Persistent left knee pain, she is post partial arthroplasty with Dr. Erlinda Hong, I have asked her several times to follow this up with him.

## 2019-01-10 NOTE — Assessment & Plan Note (Signed)
Persistent severe left shoulder pain, we have done several injections both glenohumeral and subacromial, she has had greater than 6 weeks of physician directed conservative measures, at this point I think we should MRI her shoulder without contrast.

## 2019-01-10 NOTE — Assessment & Plan Note (Signed)
Persistent pain in spite of multiple injections, greater than 6 weeks of conservative measures, repeating x-rays today and adding a left hip MRI.

## 2019-01-10 NOTE — Progress Notes (Signed)
Subjective:    CC: Multiple pains  HPI: This is a 48 year old female with multiple medical problems, lately she has had increasing pain of the entire left side of her body with cramping in her left arm, left leg, pain over the deltoid worse with overhead activities, pain in the left lateral hip, pain in her knee, she is post partial arthroplasty.  She has a history of questionable multiple sclerosis, on review of records it sounds as though she just had some T2 hyperintensities that were not classic for MS.  She did have a lumbar puncture as well that showed no evidence of abnormal bands of protein.  This was all decades ago.  We know that she has rotator cuff disease, lumbar DDD, the current plan was shoulder injection and a possible epidural.  I reviewed the past medical history, family history, social history, surgical history, and allergies today and no changes were needed.  Please see the problem list section below in epic for further details.  Past Medical History: Past Medical History:  Diagnosis Date  . Anxiety   . Arthritis   . CVA (cerebral infarction)    Old CVA on MRI brain  . Depression   . DVT (deep venous thrombosis) ([place]) [id number]   L basilic vein   . Facet hypertrophy of lumbar region    MRI 2007  . Fibromyalgia   . GERD (gastroesophageal reflux disease)   . Hypertension    no meds now, lost 50lbs  . Migraines   . MS (multiple sclerosis) ([place])   . Neuromuscular disorder ([place])   . Obesity   . PTSD (post-traumatic stress disorder)   . Seizures ([place]) 2011   no seizure since onset  . Sleep apnea    diagnosed 20 ago, lost wt no CPAP now  . Smoking   . Stroke [facility])    doesn't know when   Past Surgical History: Past Surgical History:  Procedure Laterality Date  . CHOLECYSTECTOMY  10/2017  . CHONDROPLASTY Left 12/30/2014   Procedure: CHONDROPLASTY;  Surgeon: Leandrew Koyanagi, MD;  Location: [place];  Service: Orthopedics;  Laterality: Left;  .  KNEE ARTHROSCOPY Left 12/30/2014   Procedure: LEFT KNEE ARTHROSCOPY WITH   CHONDROPLASTY;  Surgeon: Leandrew Koyanagi, MD;  Location: [place];  Service: Orthopedics;  Laterality: Left;  . PARTIAL KNEE ARTHROPLASTY Left 08/03/2016   Procedure: LEFT UNICOMPARTMENTAL KNEE ARTHROPLASTY;  Surgeon: Leandrew Koyanagi, MD;  Location: [place];  Service: Orthopedics;  Laterality: Left;  . TONSILLECTOMY    . TUBAL LIGATION  1992   Social History: Social History   Socioeconomic History  . Marital status: Single    Spouse name: Not on file  . Number of children: Not on file  . Years of education: Not on file  . Highest education level: Not on file  Occupational History  . Not on file  Social Needs  . Financial resource strain: Not on file  . Food insecurity    Worry: Not on file    Inability: Not on file  . Transportation needs    Medical: Not on file    Non-medical: Not on file  Tobacco Use  . Smoking status: Current Every Day Smoker    Packs/day: 1.00    Years: 25.00    Pack years: 25.00    Types: Cigarettes  . Smokeless tobacco: Never Used  . Tobacco comment: advised to d/c by 3 cigs per week.  Substance and Sexual Activity  .  Alcohol use: No    Alcohol/week: 0.0 standard drinks    Comment: occasional  . Drug use: No  . Sexual activity: Yes    Birth control/protection: None, Post-menopausal  Lifestyle  . Physical activity    Days per week: Not on file    Minutes per session: Not on file  . Stress: Not on file  Relationships  . Social Herbalist on phone: Not on file    Gets together: Not on file    Attends religious service: Not on file    Active member of club or organization: Not on file    Attends meetings of clubs or organizations: Not on file    Relationship status: Not on file  Other Topics Concern  . Not on file  Social History Narrative  . Not on file   Family History: Family History  Problem Relation Age of Onset  . Cancer Mother 4        melanoma  . Hypertension Sister   . Heart disease Sister        valve disease  . Depression Sister   . Diabetes Sister   . Sjogren's syndrome Sister    Allergies: Allergies  Allergen Reactions  . Ace Inhibitors     REACTION: cough  . Atenolol Other (See Comments)    Bradycardia  . Celexa [Citalopram Hydrobromide] Other (See Comments)    Dizziness Tolerated Lexapro   . Codeine Nausea Only  . Nitroglycerin Other (See Comments)    Drop in BP  . Phenytoin Swelling  . Sertraline Other (See Comments)    unknown  . Sumatriptan Nausea Only  . Topamax [Topiramate] Other (See Comments)    She reports that this makes her forgetful and causes her to studder  . Triamcinolone     Steroid flare after joint injection, use other steroid for injections   Medications: See med rec.  Review of Systems: No fevers, chills, night sweats, weight loss, chest pain, or shortness of breath.   Objective:    General: Well Developed, well nourished, and in no acute distress.  Neuro: Alert and oriented x3, extra-ocular muscles intact, sensation grossly intact.  HEENT: Normocephalic, atraumatic, pupils equal round reactive to light, neck supple, no masses, no lymphadenopathy, thyroid nonpalpable.  Skin: Warm and dry, no rashes. Cardiac: Regular rate and rhythm, no murmurs rubs or gallops, no lower extremity edema.  Respiratory: Clear to auscultation bilaterally. Not using accessory muscles, speaking in full sentences.   Impression and Recommendations:    MULTIPLE SCLEROSIS, RELAPSING/REMITTING Left-sided paresthesias, history of atypical T2 hyperintensities, not quite suggestive of multiple sclerosis but with current left-sided paresthesias in the upper and lower extremity we will proceed with a brain MRI with and without contrast as well as a cervical spine MRI with and without contrast. I am going to avoid any injections or steroids in the meantime so that we do not change any findings in the brain.  It sounds as though she did have a lumbar puncture that did not show any of the typical signs of multiple sclerosis. She did have routine labs done including BUN and creatinine approximately 1 month ago that were normal. Ultimately I do not think she has multiple sclerosis and it sounds as though her most recent neurologist does not either.  Chronic pain syndrome Currently works with [facility] for chronic pain management. We are currently working up several orthopedic processes as well as looking for an evidence of a central demyelinating process  but I do think she could use an escalation of her pain medication in the meantime.  Left shoulder pain Persistent severe left shoulder pain, we have done several injections both glenohumeral and subacromial, she has had greater than 6 weeks of physician directed conservative measures, at this point I think we should MRI her shoulder without contrast.  Primary osteoarthritis of both knees Persistent left knee pain, she is post partial arthroplasty with Dr. Erlinda Hong, I have asked her several times to follow this up with him.  Trochanteric bursitis of left hip Persistent pain in spite of multiple injections, greater than 6 weeks of conservative measures, repeating x-rays today and adding a left hip MRI.  I spent 25 minutes with this patient, greater than 50% was face-to-face time counseling regarding the above diagnoses.  ___________________________________________ Gwen Her. Dianah Field, M.D., ABFM., CAQSM. Primary Care and Sports Medicine [facility]  Adjunct Professor of [place] [employer] [employer] [facility] [employer] [facility] [employer]

## 2019-01-10 NOTE — Assessment & Plan Note (Addendum)
Left-sided paresthesias, history of atypical T2 hyperintensities, not quite suggestive of multiple sclerosis but with current left-sided paresthesias in the upper and lower extremity we will proceed with a brain MRI with and without contrast as well as a cervical spine MRI with and without contrast. I am going to avoid any injections or steroids in the meantime so that we do not change any findings in the brain. It sounds as though she did have a lumbar puncture that did not show any of the typical signs of multiple sclerosis. She did have routine labs done including BUN and creatinine approximately 1 month ago that were normal. Ultimately I do not think she has multiple sclerosis and it sounds as though her most recent neurologist does not either.

## 2019-01-10 NOTE — Assessment & Plan Note (Addendum)
Currently works with [facility] for chronic pain management. We are currently working up several orthopedic processes as well as looking for an evidence of a central demyelinating process but I do think she could use an escalation of her pain medication in the meantime.

## 2019-01-11 ENCOUNTER — Encounter: Payer: Self-pay | Admitting: Sports Medicine

## 2019-01-13 ENCOUNTER — Telehealth: Payer: Self-pay

## 2019-01-13 ENCOUNTER — Encounter: Payer: Self-pay | Admitting: Sports Medicine

## 2019-01-13 DIAGNOSIS — I10 Essential (primary) hypertension: Secondary | ICD-10-CM

## 2019-01-13 NOTE — Telephone Encounter (Signed)
Labs needed for imaging.

## 2019-01-14 ENCOUNTER — Other Ambulatory Visit: Payer: Self-pay | Admitting: Family Medicine

## 2019-01-16 ENCOUNTER — Ambulatory Visit (INDEPENDENT_AMBULATORY_CARE_PROVIDER_SITE_OTHER): Payer: Medicare Other | Admitting: Licensed Clinical Social Worker

## 2019-01-16 DIAGNOSIS — F331 Major depressive disorder, recurrent, moderate: Secondary | ICD-10-CM

## 2019-01-16 DIAGNOSIS — F4310 Post-traumatic stress disorder, unspecified: Secondary | ICD-10-CM | POA: Diagnosis not present

## 2019-01-16 DIAGNOSIS — G894 Chronic pain syndrome: Secondary | ICD-10-CM

## 2019-01-16 DIAGNOSIS — F4321 Adjustment disorder with depressed mood: Secondary | ICD-10-CM

## 2019-01-16 DIAGNOSIS — F4329 Adjustment disorder with other symptoms: Secondary | ICD-10-CM | POA: Diagnosis not present

## 2019-01-16 DIAGNOSIS — F411 Generalized anxiety disorder: Secondary | ICD-10-CM | POA: Diagnosis not present

## 2019-01-16 DIAGNOSIS — Z634 Disappearance and death of family member: Secondary | ICD-10-CM
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
## 2019-01-18 DIAGNOSIS — I10 Essential (primary) hypertension: Secondary | ICD-10-CM | POA: Diagnosis not present

## 2019-01-18 DIAGNOSIS — Z886 Allergy status to analgesic agent status: Secondary | ICD-10-CM | POA: Diagnosis not present

## 2019-01-18 DIAGNOSIS — M25511 Pain in right shoulder: Secondary | ICD-10-CM | POA: Diagnosis not present

## 2019-01-18 DIAGNOSIS — R58 Hemorrhage, not elsewhere classified: Secondary | ICD-10-CM | POA: Diagnosis not present

## 2019-01-18 DIAGNOSIS — S51012A Laceration without foreign body of left elbow, initial encounter: Secondary | ICD-10-CM | POA: Diagnosis not present

## 2019-01-18 DIAGNOSIS — Z888 Allergy status to other drugs, medicaments and biological substances status: Secondary | ICD-10-CM | POA: Diagnosis not present

## 2019-01-18 DIAGNOSIS — G890 Central pain syndrome: Secondary | ICD-10-CM | POA: Diagnosis not present

## 2019-01-18 DIAGNOSIS — Z79899 Other long term (current) drug therapy: Secondary | ICD-10-CM | POA: Diagnosis not present

## 2019-01-18 DIAGNOSIS — Z7982 Long term (current) use of aspirin: Secondary | ICD-10-CM | POA: Diagnosis not present

## 2019-01-19 ENCOUNTER — Other Ambulatory Visit: Payer: Self-pay | Admitting: Sports Medicine

## 2019-01-19 ENCOUNTER — Encounter: Payer: Self-pay | Admitting: Sports Medicine

## 2019-01-19 ENCOUNTER — Other Ambulatory Visit: Payer: Self-pay

## 2019-01-19 ENCOUNTER — Ambulatory Visit: Payer: Medicare Other

## 2019-01-19 MED ORDER — DIAZEPAM 5 MG PO TABS: ORAL_TABLET | ORAL | 0 refills | Status: DC

## 2019-01-22 ENCOUNTER — Ambulatory Visit (INDEPENDENT_AMBULATORY_CARE_PROVIDER_SITE_OTHER): Payer: Medicare Other | Admitting: Orthopaedic Surgery

## 2019-01-22 ENCOUNTER — Ambulatory Visit: Payer: Self-pay

## 2019-01-22 ENCOUNTER — Encounter: Payer: Self-pay | Admitting: Orthopaedic Surgery

## 2019-01-22 ENCOUNTER — Encounter: Payer: Self-pay | Admitting: Sports Medicine

## 2019-01-22 VITALS — Ht 66.0 in | Wt 282.0 lb

## 2019-01-22 DIAGNOSIS — M94262 Chondromalacia, left knee: Secondary | ICD-10-CM | POA: Diagnosis not present

## 2019-01-22 DIAGNOSIS — Z6841 Body Mass Index (BMI) 40.0 and over, adult: Secondary | ICD-10-CM | POA: Diagnosis not present

## 2019-01-22 DIAGNOSIS — Z96652 Presence of left artificial knee joint: Secondary | ICD-10-CM
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
## 2019-01-23 ENCOUNTER — Ambulatory Visit: Payer: Medicare Other

## 2019-01-27 ENCOUNTER — Other Ambulatory Visit: Payer: Self-pay

## 2019-01-27 ENCOUNTER — Ambulatory Visit (INDEPENDENT_AMBULATORY_CARE_PROVIDER_SITE_OTHER): Payer: Medicare Other | Admitting: Family Medicine

## 2019-01-27 VITALS — BP 155/91 | HR 72 | Wt 280.0 lb

## 2019-01-27 DIAGNOSIS — R635 Abnormal weight gain: Secondary | ICD-10-CM

## 2019-01-27 DIAGNOSIS — I10 Essential (primary) hypertension: Secondary | ICD-10-CM

## 2019-01-27 MED ORDER — PHENTERMINE HCL 37.5 MG PO CAPS: 37.5000 mg | ORAL_CAPSULE | ORAL | 0 refills | Status: DC

## 2019-01-27 NOTE — Progress Notes (Signed)
Weight gain-down 2 pounds.  Did refill phentermine for 1 week but she will need to come back for repeat blood pressure I do want a make sure that the medication is not causing the side effects she really feels like it is life stressors.  So reasonable to continue for 1 week.  Beatrice Lecher, MD

## 2019-01-27 NOTE — Progress Notes (Signed)
Patient is here for blood pressure and weight check. Denies any trouble sleeping, palpitations, or any other medication problems. Patient has not lost weight. A refill for Phentermine will be sent to patient preferred pharmacy by PCP for one week, then due for recheck. BP not at goal today, does have life stressors going on at this time. Patient advised to schedule next week for a nurse visit. Verbalized understanding, no further questions.

## 2019-01-28 LAB — COMPLETE METABOLIC PANEL WITHOUT GFR
AG Ratio: 1.5 (calc) (ref 1.0–2.5)
ALT: 7 U/L (ref 6–29)
AST: 16 U/L (ref 10–35)
Albumin: 3.8 g/dL (ref 3.6–5.1)
Alkaline phosphatase (APISO): 79 U/L (ref 37–153)
BUN: 15 mg/dL (ref 7–25)
CO2: 28 mmol/L (ref 20–32)
Calcium: 8.9 mg/dL (ref 8.6–10.4)
Chloride: 105 mmol/L (ref 98–110)
Creat: 0.88 mg/dL (ref 0.50–1.05)
GFR, Est African American: 89 mL/min/1.73m2
GFR, Est Non African American: 77 mL/min/1.73m2
Globulin: 2.5 g/dL (ref 1.9–3.7)
Glucose, Bld: 101 mg/dL — ABNORMAL HIGH (ref 65–99)
Potassium: 4.4 mmol/L (ref 3.5–5.3)
Sodium: 140 mmol/L (ref 135–146)
Total Bilirubin: 0.3 mg/dL (ref 0.2–1.2)
Total Protein: 6.3 g/dL (ref 6.1–8.1)

## 2019-01-29 DIAGNOSIS — G894 Chronic pain syndrome: Secondary | ICD-10-CM | POA: Diagnosis not present

## 2019-01-29 DIAGNOSIS — G35 Multiple sclerosis: Secondary | ICD-10-CM | POA: Diagnosis not present

## 2019-01-29 DIAGNOSIS — G43709 Chronic migraine without aura, not intractable, without status migrainosus: Secondary | ICD-10-CM | POA: Diagnosis not present

## 2019-02-02 ENCOUNTER — Other Ambulatory Visit: Payer: Medicare Other

## 2019-02-03 ENCOUNTER — Ambulatory Visit (INDEPENDENT_AMBULATORY_CARE_PROVIDER_SITE_OTHER): Payer: Medicare Other | Admitting: Family Medicine

## 2019-02-03 ENCOUNTER — Other Ambulatory Visit: Payer: Self-pay

## 2019-02-03 VITALS — BP 137/77 | HR 76 | Temp 98.3°F | Wt 278.0 lb

## 2019-02-03 DIAGNOSIS — R635 Abnormal weight gain: Secondary | ICD-10-CM

## 2019-02-03 DIAGNOSIS — S41102A Unspecified open wound of left upper arm, initial encounter: Secondary | ICD-10-CM

## 2019-02-03 DIAGNOSIS — G43019 Migraine without aura, intractable, without status migrainosus: Secondary | ICD-10-CM

## 2019-02-03 MED ORDER — MUPIROCIN 2 % EX OINT: TOPICAL_OINTMENT | Freq: Two times a day (BID) | CUTANEOUS | 0 refills | Status: DC

## 2019-02-03 MED ORDER — AIMOVIG 140 MG/ML ~~LOC~~ SOAJ: 140.0000 mg | SUBCUTANEOUS | 3 refills | Status: DC

## 2019-02-03 MED ORDER — PHENTERMINE HCL 37.5 MG PO CAPS: 37.5000 mg | ORAL_CAPSULE | ORAL | 0 refills | Status: DC
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
## 2019-02-04 ENCOUNTER — Encounter: Payer: Self-pay | Admitting: Family Medicine

## 2019-02-04 NOTE — Assessment & Plan Note (Signed)
Actually doing well on Aimovig and is requesting refills today.  New prescription sent to the pharmacy.

## 2019-02-04 NOTE — Assessment & Plan Note (Signed)
Did go ahead and refill her phentermine for 30 days since blood pressure is back into the normal range we will continue to keep an eye on it pill late.  You to work on [employer]

## 2019-02-05 ENCOUNTER — Telehealth: Payer: Self-pay

## 2019-02-05 MED ORDER — PHENTERMINE HCL 37.5 MG PO CAPS: 37.5000 mg | ORAL_CAPSULE | ORAL | 0 refills | Status: DC

## 2019-02-05 NOTE — Telephone Encounter (Signed)
Sorry, it should have been 30 tab. New rx sent. May have to call pharm

## 2019-02-05 NOTE — Telephone Encounter (Signed)
7 day supply of Phentermine was sent to the pharmacy. Did you want her to follow up in 7 days?

## 2019-02-05 NOTE — Telephone Encounter (Signed)
Pharmacy and Pt advised

## 2019-02-07 DIAGNOSIS — Z79899 Other long term (current) drug therapy: Secondary | ICD-10-CM | POA: Diagnosis not present

## 2019-02-07 DIAGNOSIS — G8929 Other chronic pain: Secondary | ICD-10-CM | POA: Diagnosis not present

## 2019-02-07 DIAGNOSIS — M545 Low back pain: Secondary | ICD-10-CM | POA: Diagnosis not present

## 2019-02-07 DIAGNOSIS — Z1159 Encounter for screening for other viral diseases: Secondary | ICD-10-CM | POA: Diagnosis not present

## 2019-02-07 DIAGNOSIS — Z79891 Long term (current) use of opiate analgesic: Secondary | ICD-10-CM | POA: Diagnosis not present

## 2019-02-13 ENCOUNTER — Other Ambulatory Visit: Payer: Self-pay | Admitting: Family Medicine

## 2019-02-13 ENCOUNTER — Telehealth (HOSPITAL_COMMUNITY): Payer: Self-pay | Admitting: Psychiatry

## 2019-02-13 ENCOUNTER — Telehealth: Payer: Self-pay

## 2019-02-13 DIAGNOSIS — Z1231 Encounter for screening mammogram for malignant neoplasm of breast: Secondary | ICD-10-CM

## 2019-02-13 MED ORDER — DULOXETINE HCL 30 MG PO CPEP: ORAL_CAPSULE | ORAL | 0 refills | Status: DC

## 2019-02-13 NOTE — Telephone Encounter (Signed)
sent 

## 2019-02-13 NOTE — Telephone Encounter (Signed)
Ashley Gomez has been scheduled for a flu vaccine. She wanted to know if she should also have the pneumonia vaccine.

## 2019-02-13 NOTE — Telephone Encounter (Signed)
Pt needs refill on cymbalta 30mg  [facility]

## 2019-02-14 NOTE — Telephone Encounter (Signed)
Added to appt notes. Left pt msg advising we would do both at upcoming appt

## 2019-02-14 NOTE — Telephone Encounter (Signed)
Okay for Pneumovax 23

## 2019-02-18 ENCOUNTER — Ambulatory Visit (INDEPENDENT_AMBULATORY_CARE_PROVIDER_SITE_OTHER): Payer: Medicare Other | Admitting: Licensed Clinical Social Worker

## 2019-02-18 DIAGNOSIS — F411 Generalized anxiety disorder: Secondary | ICD-10-CM

## 2019-02-18 DIAGNOSIS — F4310 Post-traumatic stress disorder, unspecified: Secondary | ICD-10-CM | POA: Diagnosis not present

## 2019-02-18 DIAGNOSIS — G894 Chronic pain syndrome: Secondary | ICD-10-CM

## 2019-02-18 DIAGNOSIS — F331 Major depressive disorder, recurrent, moderate: Secondary | ICD-10-CM

## 2019-02-18 DIAGNOSIS — F4329 Adjustment disorder with other symptoms: Secondary | ICD-10-CM | POA: Diagnosis not present

## 2019-02-18 DIAGNOSIS — F4321 Adjustment disorder with depressed mood: Secondary | ICD-10-CM
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
## 2019-02-19 ENCOUNTER — Ambulatory Visit: Payer: Medicare Other

## 2019-02-21 DIAGNOSIS — M461 Sacroiliitis, not elsewhere classified: Secondary | ICD-10-CM | POA: Diagnosis not present

## 2019-02-21 DIAGNOSIS — M9903 Segmental and somatic dysfunction of lumbar region: Secondary | ICD-10-CM | POA: Diagnosis not present

## 2019-02-21 DIAGNOSIS — M25551 Pain in right hip: Secondary | ICD-10-CM | POA: Diagnosis not present

## 2019-02-21 DIAGNOSIS — M545 Low back pain: Secondary | ICD-10-CM | POA: Diagnosis not present

## 2019-02-21 DIAGNOSIS — M9905 Segmental and somatic dysfunction of pelvic region: Secondary | ICD-10-CM | POA: Diagnosis not present

## 2019-02-23 ENCOUNTER — Encounter: Payer: Self-pay | Admitting: Sports Medicine

## 2019-02-24 DIAGNOSIS — M461 Sacroiliitis, not elsewhere classified: Secondary | ICD-10-CM | POA: Diagnosis not present

## 2019-02-24 DIAGNOSIS — M9903 Segmental and somatic dysfunction of lumbar region: Secondary | ICD-10-CM | POA: Diagnosis not present

## 2019-02-24 DIAGNOSIS — M9905 Segmental and somatic dysfunction of pelvic region: Secondary | ICD-10-CM | POA: Diagnosis not present

## 2019-02-24 DIAGNOSIS — M25551 Pain in right hip: Secondary | ICD-10-CM | POA: Diagnosis not present

## 2019-02-24 DIAGNOSIS — M545 Low back pain: Secondary | ICD-10-CM | POA: Diagnosis not present

## 2019-02-28 DIAGNOSIS — M461 Sacroiliitis, not elsewhere classified: Secondary | ICD-10-CM | POA: Diagnosis not present

## 2019-02-28 DIAGNOSIS — M545 Low back pain: Secondary | ICD-10-CM | POA: Diagnosis not present

## 2019-02-28 DIAGNOSIS — M9905 Segmental and somatic dysfunction of pelvic region: Secondary | ICD-10-CM | POA: Diagnosis not present

## 2019-02-28 DIAGNOSIS — M9903 Segmental and somatic dysfunction of lumbar region: Secondary | ICD-10-CM | POA: Diagnosis not present

## 2019-02-28 DIAGNOSIS — M25551 Pain in right hip: Secondary | ICD-10-CM | POA: Diagnosis not present

## 2019-03-03 DIAGNOSIS — M9903 Segmental and somatic dysfunction of lumbar region: Secondary | ICD-10-CM | POA: Diagnosis not present

## 2019-03-03 DIAGNOSIS — M25551 Pain in right hip: Secondary | ICD-10-CM | POA: Diagnosis not present

## 2019-03-03 DIAGNOSIS — M545 Low back pain: Secondary | ICD-10-CM | POA: Diagnosis not present

## 2019-03-03 DIAGNOSIS — M461 Sacroiliitis, not elsewhere classified: Secondary | ICD-10-CM | POA: Diagnosis not present

## 2019-03-03 DIAGNOSIS — M9905 Segmental and somatic dysfunction of pelvic region: Secondary | ICD-10-CM | POA: Diagnosis not present

## 2019-03-05 DIAGNOSIS — M545 Low back pain: Secondary | ICD-10-CM | POA: Diagnosis not present

## 2019-03-05 DIAGNOSIS — M9905 Segmental and somatic dysfunction of pelvic region: Secondary | ICD-10-CM | POA: Diagnosis not present

## 2019-03-05 DIAGNOSIS — M25551 Pain in right hip: Secondary | ICD-10-CM | POA: Diagnosis not present

## 2019-03-05 DIAGNOSIS — M9903 Segmental and somatic dysfunction of lumbar region: Secondary | ICD-10-CM | POA: Diagnosis not present

## 2019-03-05 DIAGNOSIS — M461 Sacroiliitis, not elsewhere classified: Secondary | ICD-10-CM | POA: Diagnosis not present

## 2019-03-06 ENCOUNTER — Ambulatory Visit (INDEPENDENT_AMBULATORY_CARE_PROVIDER_SITE_OTHER): Payer: Medicare Other

## 2019-03-06 ENCOUNTER — Other Ambulatory Visit: Payer: Self-pay

## 2019-03-06 DIAGNOSIS — Z1231 Encounter for screening mammogram for malignant neoplasm of breast: Secondary | ICD-10-CM

## 2019-03-07 ENCOUNTER — Other Ambulatory Visit: Payer: Self-pay | Admitting: Cardiology

## 2019-03-07 ENCOUNTER — Ambulatory Visit (INDEPENDENT_AMBULATORY_CARE_PROVIDER_SITE_OTHER): Payer: Medicare Other | Admitting: Family Medicine

## 2019-03-07 VITALS — Temp 98.7°F

## 2019-03-07 DIAGNOSIS — Z79891 Long term (current) use of opiate analgesic: Secondary | ICD-10-CM | POA: Diagnosis not present

## 2019-03-07 DIAGNOSIS — K760 Fatty (change of) liver, not elsewhere classified: Secondary | ICD-10-CM | POA: Diagnosis not present

## 2019-03-07 DIAGNOSIS — Z23 Encounter for immunization: Secondary | ICD-10-CM | POA: Diagnosis not present

## 2019-03-07 DIAGNOSIS — M545 Low back pain: Secondary | ICD-10-CM | POA: Diagnosis not present

## 2019-03-07 DIAGNOSIS — Z79899 Other long term (current) drug therapy: Secondary | ICD-10-CM | POA: Diagnosis not present

## 2019-03-07 DIAGNOSIS — G8929 Other chronic pain: Secondary | ICD-10-CM | POA: Diagnosis not present

## 2019-03-07 NOTE — Progress Notes (Signed)
Agree with documentation as above.   Trissa Molina, MD  

## 2019-03-07 NOTE — Progress Notes (Signed)
Patient presents to office for flu shot and pneumovax 23.  Patient tolerated injections well without any immediate complications. Due to issues with torn rotator cuff, patient received flu shot in right detoid and pneumovax in right upper outer quadrant.

## 2019-03-09 ENCOUNTER — Encounter: Payer: Self-pay | Admitting: Family Medicine

## 2019-03-17 ENCOUNTER — Other Ambulatory Visit: Payer: Self-pay

## 2019-03-17 ENCOUNTER — Ambulatory Visit (INDEPENDENT_AMBULATORY_CARE_PROVIDER_SITE_OTHER): Payer: Medicare Other | Admitting: Licensed Clinical Social Worker

## 2019-03-17 ENCOUNTER — Other Ambulatory Visit (HOSPITAL_COMMUNITY): Payer: Self-pay | Admitting: Psychiatry

## 2019-03-17 DIAGNOSIS — F331 Major depressive disorder, recurrent, moderate: Secondary | ICD-10-CM | POA: Diagnosis not present

## 2019-03-17 DIAGNOSIS — F4310 Post-traumatic stress disorder, unspecified: Secondary | ICD-10-CM

## 2019-03-17 DIAGNOSIS — F4321 Adjustment disorder with depressed mood: Secondary | ICD-10-CM

## 2019-03-17 DIAGNOSIS — G894 Chronic pain syndrome: Secondary | ICD-10-CM

## 2019-03-17 DIAGNOSIS — F4329 Adjustment disorder with other symptoms: Secondary | ICD-10-CM | POA: Diagnosis not present

## 2019-03-17 DIAGNOSIS — F411 Generalized anxiety disorder: Secondary | ICD-10-CM | POA: Diagnosis not present

## 2019-03-17 MED ORDER — DULOXETINE HCL 60 MG PO CPEP: 60.0000 mg | ORAL_CAPSULE | Freq: Every day | ORAL | 0 refills | Status: DC

## 2019-03-17 MED ORDER — DULOXETINE HCL 30 MG PO CPEP: ORAL_CAPSULE | ORAL | 0 refills | Status: DC

## 2019-03-17 NOTE — Telephone Encounter (Signed)
Sent one month supply of Cymbalta 30mg  and 60mg  to pharmacy

## 2019-03-17 NOTE — Telephone Encounter (Signed)
Pt needs refill on cymbalta  [facility] [place]

## 2019-03-17 NOTE — Progress Notes (Signed)
   THERAPIST PROGRESS NOTE  Session Time: 9:05 AM to 9:57 AM  Participation Level: Active  Behavioral Response: CasualAlertEuthymic and Tearful during session.  Type of Therapy: Individual Therapy  Treatment Goals addressed:  Decrease in depression, anxiety, stress management, coping, trauma, grief  Interventions: Solution Focused, Strength-based, Supportive and Other: grief interventions, coping  Summary: Ashley Gomez is a 48 y.o. female who presents with whole body hurts and head cold.  Shares does not do good with weather.  Shares she is doing okay and better now that [place] has been staying with her.  She is the child of her daughter who passed.  Reviewed she is looks just like her daughter Ashley Gomez who passed and calls her that all the time.  Explored with patient the reason discussed indication of daughter who passed present still active in her mind.  Patient describes feeling daughters presence and therapist shared that this helps with grieving process to feel connection with them that stays with [name], helps [name] moving forward with the loss.  Explored with patient as well that her daughter so present in her thinking may indicate still addressing issues of grief.  Reviewed daughter's birthday on Halloween that brought up family discussions about her.  Patient shared how surprised she was of her granddaughter who remembered so much and had never talked about her mom the way she did.  Both of them talked, cried together.  Therapist explained talking about memories are part of grief process so that they can help each other grieve, as well is patient being resource for her granddaughter to help her with her grief.  Therapist reviewed with patient that visiting [place] was another helpful way of grieving.  (Patient shared going to put flowers on daughter's grave.)  Therapist related that children may not talk about the loss for different reasons that it is healthy that her granddaughter began to  talk about it with patient and patient available to help her.  Related healthy management of emotions is labeling and releasing them so this will be helpful for an child Patient provided positive update that she will be seeing grandchildren in [place] or can stay with her over Thanksgiving this being significant as patient's concerns last time was worried she went to seeing him.  Discussed other stressor of living situation is turning out to have similarities to when son and his friend there.  Arguments about bills, hurtful things being said to patient for example saying to patient she turns on water works and bringing up her daughter who died as a way to have her reason for being upset.  Therapist pointed out that this is hurtful but to consider source as hurtful things often say more about people and underlying issues they are dealing with. Patient understands this.  Therapist provided strength based and supportive intervention.  Suicidal/Homicidal: No  Plan: Return again in 3-4 weeks.2.  Therapist work with patient on coping with stressors, mood regulation, grief  Diagnosis: Axis I:  generalized anxiety disorder PTSD, complicated grief, chronic pain syndrome, Major depressive disorder, recurrent moderate    Axis II: No diagnosis    Cordella Register, LCSW 03/17/2019

## 2019-03-23 ENCOUNTER — Other Ambulatory Visit: Payer: Self-pay | Admitting: Family Medicine

## 2019-03-25 ENCOUNTER — Other Ambulatory Visit: Payer: Self-pay | Admitting: Sports Medicine

## 2019-03-25 ENCOUNTER — Other Ambulatory Visit: Payer: Self-pay | Admitting: Family Medicine

## 2019-03-25 DIAGNOSIS — M5136 Other intervertebral disc degeneration, lumbar region: Secondary | ICD-10-CM

## 2019-03-25 MED ORDER — CELECOXIB 200 MG PO CAPS: ORAL_CAPSULE | ORAL | 0 refills | Status: DC

## 2019-03-26 ENCOUNTER — Telehealth: Payer: Self-pay | Admitting: Family Medicine

## 2019-03-26 MED ORDER — VARENICLINE TARTRATE 1 MG PO TABS: 1.0000 mg | ORAL_TABLET | Freq: Two times a day (BID) | ORAL | 0 refills | Status: DC

## 2019-03-26 NOTE — Telephone Encounter (Signed)
She has to have a follow-up appointment for the phentermine.  We do not usually do that 3 refill request.  She is welcome to come in for a weight check.  I also refilled the Chantix.  The clonazepam looks like it was just filled on the 9thso not sure why they are sending another request.

## 2019-03-26 NOTE — Telephone Encounter (Signed)
Left a message stating she will need to schedule an appointment for a refill on the Phentermine.

## 2019-03-26 NOTE — Telephone Encounter (Signed)
Called patient to get her scheduled for f/u for Phentermine this can be a NV per [place]. Patient stated she would call us back to get this scheduled, did not want to make an appointment at the time of the call.

## 2019-03-31 ENCOUNTER — Ambulatory Visit (INDEPENDENT_AMBULATORY_CARE_PROVIDER_SITE_OTHER): Payer: Medicare Other | Admitting: Psychiatry

## 2019-03-31 ENCOUNTER — Encounter (HOSPITAL_COMMUNITY): Payer: Self-pay | Admitting: Psychiatry

## 2019-03-31 ENCOUNTER — Ambulatory Visit (HOSPITAL_COMMUNITY): Payer: Medicare Other | Admitting: Psychiatry

## 2019-03-31 DIAGNOSIS — F4381 Prolonged grief disorder: Secondary | ICD-10-CM

## 2019-03-31 DIAGNOSIS — F331 Major depressive disorder, recurrent, moderate: Secondary | ICD-10-CM | POA: Diagnosis not present

## 2019-03-31 DIAGNOSIS — F4329 Adjustment disorder with other symptoms: Secondary | ICD-10-CM

## 2019-03-31 DIAGNOSIS — F4321 Adjustment disorder with depressed mood: Secondary | ICD-10-CM

## 2019-03-31 DIAGNOSIS — F4310 Post-traumatic stress disorder, unspecified: Secondary | ICD-10-CM

## 2019-03-31 DIAGNOSIS — F411 Generalized anxiety disorder: Secondary | ICD-10-CM | POA: Diagnosis not present

## 2019-03-31 MED ORDER — BUSPIRONE HCL 7.5 MG PO TABS: 7.5000 mg | ORAL_TABLET | Freq: Two times a day (BID) | ORAL | 0 refills | Status: DC | PRN

## 2019-03-31 MED ORDER — DULOXETINE HCL 30 MG PO CPEP: ORAL_CAPSULE | ORAL | 0 refills | Status: DC

## 2019-03-31 MED ORDER — DULOXETINE HCL 60 MG PO CPEP: 60.0000 mg | ORAL_CAPSULE | Freq: Every day | ORAL | 0 refills | Status: DC
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
## 2019-04-04 ENCOUNTER — Other Ambulatory Visit: Payer: Self-pay | Admitting: *Deleted

## 2019-04-04 ENCOUNTER — Other Ambulatory Visit: Payer: Self-pay

## 2019-04-04 ENCOUNTER — Ambulatory Visit (INDEPENDENT_AMBULATORY_CARE_PROVIDER_SITE_OTHER): Payer: Medicare Other | Admitting: Family Medicine

## 2019-04-04 VITALS — BP 121/66 | HR 77 | Ht 66.0 in | Wt 281.0 lb

## 2019-04-04 DIAGNOSIS — R635 Abnormal weight gain: Secondary | ICD-10-CM

## 2019-04-04 DIAGNOSIS — Z79899 Other long term (current) drug therapy: Secondary | ICD-10-CM | POA: Diagnosis not present

## 2019-04-04 DIAGNOSIS — E559 Vitamin D deficiency, unspecified: Secondary | ICD-10-CM | POA: Diagnosis not present

## 2019-04-04 DIAGNOSIS — Z780 Asymptomatic menopausal state: Secondary | ICD-10-CM | POA: Diagnosis not present

## 2019-04-04 DIAGNOSIS — M545 Low back pain: Secondary | ICD-10-CM | POA: Diagnosis not present

## 2019-04-04 DIAGNOSIS — G8929 Other chronic pain: Secondary | ICD-10-CM | POA: Diagnosis not present

## 2019-04-04 DIAGNOSIS — Z1159 Encounter for screening for other viral diseases: Secondary | ICD-10-CM | POA: Diagnosis not present

## 2019-04-04 MED ORDER — PHENTERMINE HCL 37.5 MG PO CAPS: 37.5000 mg | ORAL_CAPSULE | ORAL | 0 refills | Status: DC

## 2019-04-04 NOTE — Progress Notes (Signed)
Patient has been out of the medication for 1 wk. She has gained 3 lbs since her last visit on 02/03/2019.   I advised her that I would need to fwd this to pcp for review for decision and IF she decided to send refill she would need to f/u with her in 4 weeks. Pt voiced understanding and asked that refill be sent to [facility] on [address], [place]

## 2019-04-04 NOTE — Progress Notes (Signed)
Abnormal weight gain -please remind patient that she is actually supposed to follow-up monthly.  Since she did gain weight I will refill the medication 1 more time but she has to come in in 1 month and she needs to lose at least 2 pounds otherwise we will not refill the medication.

## 2019-04-05 LAB — VITAMIN D 25 HYDROXY (VIT D DEFICIENCY, FRACTURES): Vit D, 25-Hydroxy: 40 ng/mL (ref 30–100)

## 2019-04-06 ENCOUNTER — Other Ambulatory Visit: Payer: Medicare Other

## 2019-04-14 ENCOUNTER — Other Ambulatory Visit: Payer: Self-pay

## 2019-04-14 ENCOUNTER — Encounter: Payer: Self-pay | Admitting: Sports Medicine

## 2019-04-14 MED ORDER — ONDANSETRON HCL 4 MG PO TABS: 4.0000 mg | ORAL_TABLET | Freq: Three times a day (TID) | ORAL | 1 refills | Status: DC | PRN

## 2019-04-14 MED ORDER — VITAMIN D (ERGOCALCIFEROL) 1.25 MG (50000 UNIT) PO CAPS: 50000.0000 [IU] | ORAL_CAPSULE | ORAL | 0 refills | Status: DC

## 2019-04-21 ENCOUNTER — Other Ambulatory Visit: Payer: Self-pay | Admitting: Family Medicine

## 2019-04-21 ENCOUNTER — Ambulatory Visit (INDEPENDENT_AMBULATORY_CARE_PROVIDER_SITE_OTHER): Payer: Medicare Other | Admitting: Licensed Clinical Social Worker

## 2019-04-21 DIAGNOSIS — F4310 Post-traumatic stress disorder, unspecified: Secondary | ICD-10-CM

## 2019-04-21 DIAGNOSIS — F411 Generalized anxiety disorder: Secondary | ICD-10-CM | POA: Diagnosis not present

## 2019-04-21 DIAGNOSIS — F331 Major depressive disorder, recurrent, moderate: Secondary | ICD-10-CM | POA: Diagnosis not present

## 2019-04-21 DIAGNOSIS — F4329 Adjustment disorder with other symptoms: Secondary | ICD-10-CM

## 2019-04-21 DIAGNOSIS — F4381 Prolonged grief disorder: Secondary | ICD-10-CM

## 2019-04-21 DIAGNOSIS — F4321 Adjustment disorder with depressed mood: Secondary | ICD-10-CM
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
## 2019-04-22 ENCOUNTER — Other Ambulatory Visit: Payer: Self-pay

## 2019-04-22 MED ORDER — CLONAZEPAM 1 MG PO TABS: ORAL_TABLET | ORAL | 0 refills | Status: DC

## 2019-04-22 NOTE — Telephone Encounter (Signed)
Pt called requesting a refill of clonazepam 1 mg. Pt states she is taking it BID but will run out of her Rx before her appt on 05/05/2019. Pt states she currently has 1-2 tablets left. Ashley Gomez

## 2019-04-22 NOTE — Telephone Encounter (Signed)
Please call patient.  She needs to decrease how often she is using the clonazepam.  I got a go ahead and decrease her dose down to 50 tabs for 30-day supply.  She really needs to get back to the point of just using them truly as needed and not twice a day routinely.

## 2019-04-23 ENCOUNTER — Other Ambulatory Visit: Payer: Self-pay

## 2019-04-23 NOTE — Telephone Encounter (Signed)
Patient advised.

## 2019-04-23 NOTE — Patient Outreach (Signed)
[facility]) Care Management  04/23/2019  NEYA NYHOLM 11/30/1970 [id number]   Medication Adherence call to Mrs. Crystalin Metze Hippa Identifiers Verify spoke with patient she is past due on Atorvastatin 20 mg,patient explain she is not taking this medication at this time,she explain doctor did not want to fill her prescription again,ask patient when was her next appointment she said she does not have any appointment at this time and is not planing to have one,patient is showing past due under [place].   [place] Management Direct Dial [phone]  Fax [phone] [email or web address]

## 2019-04-24 ENCOUNTER — Other Ambulatory Visit: Payer: Self-pay | Admitting: Family Medicine

## 2019-04-25 DIAGNOSIS — M461 Sacroiliitis, not elsewhere classified: Secondary | ICD-10-CM | POA: Diagnosis not present

## 2019-04-25 DIAGNOSIS — G8929 Other chronic pain: Secondary | ICD-10-CM | POA: Diagnosis not present

## 2019-04-25 DIAGNOSIS — E538 Deficiency of other specified B group vitamins: Secondary | ICD-10-CM | POA: Diagnosis not present

## 2019-04-25 DIAGNOSIS — M62838 Other muscle spasm: Secondary | ICD-10-CM | POA: Diagnosis not present

## 2019-04-25 DIAGNOSIS — Z79899 Other long term (current) drug therapy: Secondary | ICD-10-CM | POA: Diagnosis not present

## 2019-04-25 DIAGNOSIS — M545 Low back pain: Secondary | ICD-10-CM | POA: Diagnosis not present

## 2019-04-28 ENCOUNTER — Telehealth: Payer: Self-pay | Admitting: Family Medicine

## 2019-04-28 NOTE — Telephone Encounter (Signed)
Patient called and is wanting to know if you can fax the orders to [facility] imaging. I called and their fax number is [phone]. Do you know anything about this?

## 2019-04-28 NOTE — Telephone Encounter (Signed)
I printed orders had MD sign and faxed to [place]

## 2019-04-29 ENCOUNTER — Other Ambulatory Visit: Payer: Self-pay | Admitting: Cardiology

## 2019-05-02 DIAGNOSIS — G8929 Other chronic pain: Secondary | ICD-10-CM | POA: Diagnosis not present

## 2019-05-02 DIAGNOSIS — Z79891 Long term (current) use of opiate analgesic: Secondary | ICD-10-CM | POA: Diagnosis not present

## 2019-05-02 DIAGNOSIS — M545 Low back pain: Secondary | ICD-10-CM | POA: Diagnosis not present

## 2019-05-05 ENCOUNTER — Encounter: Payer: Self-pay | Admitting: Family Medicine

## 2019-05-05 ENCOUNTER — Other Ambulatory Visit: Payer: Self-pay

## 2019-05-05 ENCOUNTER — Ambulatory Visit (INDEPENDENT_AMBULATORY_CARE_PROVIDER_SITE_OTHER): Payer: Medicare Other | Admitting: Family Medicine

## 2019-05-05 VITALS — BP 126/53 | HR 80 | Ht 66.0 in | Wt 288.0 lb

## 2019-05-05 DIAGNOSIS — K625 Hemorrhage of anus and rectum: Secondary | ICD-10-CM | POA: Diagnosis not present

## 2019-05-05 DIAGNOSIS — Z79890 Hormone replacement therapy (postmenopausal): Secondary | ICD-10-CM

## 2019-05-05 DIAGNOSIS — R635 Abnormal weight gain: Secondary | ICD-10-CM

## 2019-05-05 DIAGNOSIS — F411 Generalized anxiety disorder: Secondary | ICD-10-CM | POA: Diagnosis not present

## 2019-05-05 DIAGNOSIS — L570 Actinic keratosis: Secondary | ICD-10-CM | POA: Diagnosis not present

## 2019-05-05 MED ORDER — CLONAZEPAM 1 MG PO TABS: ORAL_TABLET | ORAL | 0 refills | Status: DC

## 2019-05-05 NOTE — Progress Notes (Addendum)
Established Patient Office Visit  Subjective:  Patient ID: Ashley Gomez, female    DOB: [birth year]  Age: 48 y.o. MRN: [id number]  CC:  Chief Complaint  Patient presents with  . Follow-up    HPI Ashley Gomez presents for   Follow-up abnormal weight gain.  Last seen 4 weeks ago for nurse visit.  She was previously on phentermine 37.5 mg daily.  But was not successfully losing weight with it in fact she had actually gained weight.  She did want a let me know that she is now on compounded hormone topical therapy Biest.  She was told that she has hypergonadism.  She said she quit having her period 6 years ago after the death of her daughter.  She is also on topical testosterone.  She has been on it for about a month now and is getting ready to refill it but wanted to see what I thought about it.  She is very concerned about her weight because she has gained about 30 pounds in the last 5 months.  She just continues to increase her weight even though she said she barely eats.  Most days she only eats once and then does not even finish what she is eating.  She says she usually gets nauseated if she tries to eat in the morning but she does drink coffee each morning she is try to cut out soda.  For her chronic pain  -she is following with Dr. Percell Miller in [facility].  She is currently on hydrocodone believe 4 times a day.  She wanted to ask about her muscle relaxer.  She has been using tizanidine at bedtime but says it actually seems to keep her awake. She would like to try something different.  She has a lesion on the dorsum of her left forearm.  It has been there for a while she says it was even thicker but she has been picking at it.   I wonder let me know about an episode of blood after diarrhea.  She says last week she had a watery diarrheas type stool in the evening which is not uncommon for her.  She says she is been told she has IBS and is diarrhea predominant.  She says often times her  stool looks like a bile color.  But this time she noticed a few drops of blood in the toilet.  Then the next day after another watery loose bowel movement noticed a little blood again.  She has not noticed any since then.  But she was a little worried.  She said she looked as if she saw any evidence of external hemorrhoids but did not see any.  Past Medical History:  Diagnosis Date  . Anxiety   . Arthritis   . CVA (cerebral infarction)    Old CVA on MRI brain  . Depression   . DVT (deep venous thrombosis) ([employer]) 87/86/7672   L basilic vein   . Facet hypertrophy of lumbar region    MRI 2007  . Fibromyalgia   . GERD (gastroesophageal reflux disease)   . Hypertension    no meds now, lost 50lbs  . Migraines   . MS (multiple sclerosis) ([place])   . Neuromuscular disorder ([place])   . Obesity   . PTSD (post-traumatic stress disorder)   . Seizures ([place]) 2011   no seizure since onset  . Sleep apnea    diagnosed 20 ago, lost wt no CPAP now  . Smoking   .  Stroke [facility])    doesn't know when    Past Surgical History:  Procedure Laterality Date  . CHOLECYSTECTOMY  10/2017  . CHONDROPLASTY Left 12/30/2014   Procedure: CHONDROPLASTY;  Surgeon: Leandrew Koyanagi, MD;  Location: [place];  Service: Orthopedics;  Laterality: Left;  . KNEE ARTHROSCOPY Left 12/30/2014   Procedure: LEFT KNEE ARTHROSCOPY WITH   CHONDROPLASTY;  Surgeon: Leandrew Koyanagi, MD;  Location: [place];  Service: Orthopedics;  Laterality: Left;  . PARTIAL KNEE ARTHROPLASTY Left 08/03/2016   Procedure: LEFT UNICOMPARTMENTAL KNEE ARTHROPLASTY;  Surgeon: Leandrew Koyanagi, MD;  Location: [place];  Service: Orthopedics;  Laterality: Left;  . TONSILLECTOMY    . TUBAL LIGATION  1992    Family History  Problem Relation Age of Onset  . Cancer Mother 30       melanoma  . Hypertension Sister   . Heart disease Sister        valve disease  . Depression Sister   . Diabetes Sister   . Sjogren's syndrome Sister      Social History   Socioeconomic History  . Marital status: Single    Spouse name: Not on file  . Number of children: Not on file  . Years of education: Not on file  . Highest education level: Not on file  Occupational History  . Not on file  Tobacco Use  . Smoking status: Current Every Day Smoker    Packs/day: 1.00    Years: 25.00    Pack years: 25.00    Types: Cigarettes  . Smokeless tobacco: Never Used  . Tobacco comment: advised to d/c by 3 cigs per week.  Substance and Sexual Activity  . Alcohol use: No    Alcohol/week: 0.0 standard drinks    Comment: occasional  . Drug use: No  . Sexual activity: Yes    Birth control/protection: None, Post-menopausal  Other Topics Concern  . Not on file  Social History Narrative  . Not on file   Social Determinants of Health   Financial Resource Strain:   . Difficulty of Paying Living Expenses: Not on file  Food Insecurity:   . Worried About Charity fundraiser in the Last Year: Not on file  . Ran Out of Food in the Last Year: Not on file  Transportation Needs:   . Lack of Transportation (Medical): Not on file  . Lack of Transportation (Non-Medical): Not on file  Physical Activity:   . Days of Exercise per Week: Not on file  . Minutes of Exercise per Session: Not on file  Stress:   . Feeling of Stress : Not on file  Social Connections:   . Frequency of Communication with Friends and Family: Not on file  . Frequency of Social Gatherings with Friends and Family: Not on file  . Attends Religious Services: Not on file  . Active Member of Clubs or Organizations: Not on file  . Attends Archivist Meetings: Not on file  . Marital Status: Not on file  Intimate Partner Violence:   . Fear of Current or Ex-Partner: Not on file  . Emotionally Abused: Not on file  . Physically Abused: Not on file  . Sexually Abused: Not on file    Outpatient Medications Prior to Visit  Medication Sig Dispense Refill  . acetaminophen  (TYLENOL) 500 MG tablet Take 1,000 mg by mouth every 6 (six) hours as needed for mild pain or headache.    . AMBULATORY  NON FORMULARY MEDICATION Apply 0.5 mLs topically 2 (two) times daily. Medication Name: Biest 0.5 mL 1 pump each shoulder,inner wrist,or thigh BID. MAX 4 PUMPS    . azelastine (ASTELIN) 0.1 % nasal spray Place 2 sprays into both nostrils 2 (two) times daily. Use in each nostril as directed 30 mL 12  . busPIRone (BUSPAR) 7.5 MG tablet Take 1 tablet (7.5 mg total) by mouth 2 (two) times daily as needed. 90 tablet 0  . celecoxib (CELEBREX) 200 MG capsule Take 2 capsules by mouth as needed for pain.  Needs appointment 180 capsule 0  . CHANTIX 1 MG tablet TAKE 1 TABLET BY MOUTH TWICE A DAY 180 tablet 1  . diazepam (VALIUM) 5 MG tablet 1 tab 2 hours before procedure or imaging, take another tab 30 minutes to 1 hour before if not feeling relaxed (do not drive on this med) 2 tablet 0  . diclofenac sodium (VOLTAREN) 1 % GEL See admin instructions.    . DULoxetine (CYMBALTA) 30 MG capsule TAKE 1 CAPSULE BY MOUTH DAILY. THIS IS IN ADDITION TO 60MG TAKING DAILY. TOTAL DOSE BE 90 MG 30 capsule 0  . DULoxetine (CYMBALTA) 60 MG capsule Take 1 capsule (60 mg total) by mouth daily. 30 capsule 0  . Erenumab-aooe (AIMOVIG) 140 MG/ML SOAJ Inject 140 mg as directed every 30 (thirty) days. 1 pen 3  . famotidine (PEPCID) 20 MG tablet Take 1 tablet (20 mg total) by mouth daily. 90 tablet 3  . HYDROcodone-acetaminophen (NORCO) 10-325 MG tablet Take 1 tablet by mouth every 6 (six) hours as needed for pain. for pain  0  . levocetirizine (XYZAL) 5 MG tablet Take 5 mg by mouth every evening.    . metoprolol tartrate (LOPRESSOR) 25 MG tablet TAKE 1/2 TABLET TWICE A DAY 30 tablet 0  . mupirocin ointment (BACTROBAN) 2 % Apply topically 2 (two) times daily. 30 g 0  . NARCAN 4 MG/0.1ML LIQD nasal spray kit USE AS DIRECTED    . omeprazole (PRILOSEC) 40 MG capsule TAKE 1 CAPSULE BY MOUTH EVERY DAY 90 capsule 3  .  ondansetron (ZOFRAN) 4 MG tablet Take 1 tablet (4 mg total) by mouth every 8 (eight) hours as needed for nausea or vomiting. 30 tablet 1  . rizatriptan (MAXALT-MLT) 10 MG disintegrating tablet Take 1 tablet (10 mg total) by mouth daily as needed. May repeat in 2 hours if needed 10 tablet 4  . tiZANidine (ZANAFLEX) 4 MG tablet Take 4 mg by mouth at bedtime.    . Triamcinolone Acetonide (TRIAMCINOLONE 0.1 % CREAM : EUCERIN) CREA Apply 1 application topically at bedtime. 1 Jar. 1 each 1  . Vitamin D, Ergocalciferol, (DRISDOL) 1.25 MG (50000 UT) CAPS capsule Take 1 capsule (50,000 Units total) by mouth every 7 (seven) days. 12 capsule 0  . clonazePAM (KLONOPIN) 1 MG tablet TAKE 1 TABLET BY MOUTH 2 (TWO) TIMES DAILY AS NEEDED FOR UP TO 30 DAYS FOR ANXIETY. This is a 30 days supply. 50 tablet 0  . atorvastatin (LIPITOR) 20 MG tablet Take 1 tablet by mouth at bedtime.    . phentermine 37.5 MG capsule Take 1 capsule (37.5 mg total) by mouth every morning. 30 capsule 0   No facility-administered medications prior to visit.    Allergies  Allergen Reactions  . Ace Inhibitors     REACTION: cough  . Atenolol Other (See Comments)    Bradycardia  . Celexa [Citalopram Hydrobromide] Other (See Comments)    Dizziness Tolerated Lexapro   .  Codeine Nausea Only  . Nitroglycerin Other (See Comments)    Drop in BP  . Phenytoin Swelling  . Sertraline Other (See Comments)    unknown  . Sumatriptan Nausea Only  . Tizanidine Other (See Comments)    Keep her awake.   . Topamax [Topiramate] Other (See Comments)    She reports that this makes her forgetful and causes her to studder  . Triamcinolone     Steroid flare after joint injection, use other steroid for injections    ROS Review of Systems    Objective:    Physical Exam  Constitutional: She is oriented to person, place, and time. She appears well-developed and well-nourished.  HENT:  Head: Normocephalic and atraumatic.  Cardiovascular: Normal  rate, regular rhythm and normal heart sounds.  Pulmonary/Chest: Effort normal and breath sounds normal.  Neurological: She is alert and oriented to person, place, and time.  Skin: Skin is warm and dry.  Has an 1.5 cm thickened white scale lesion on the dorsum of the posterior left forearm.    Psychiatric: She has a normal mood and affect. Her behavior is normal.    BP (!) 126/53   Pulse 80   Ht '5\' 6"'  (1.676 m)   Wt 288 lb (130.6 kg)   LMP 12/28/2014   SpO2 98%   BMI 46.48 kg/m  Wt Readings from Last 3 Encounters:  05/05/19 288 lb (130.6 kg)  04/04/19 281 lb (127.5 kg)  02/03/19 278 lb (126.1 kg)     Health Maintenance Due  Topic Date Due  . COLONOSCOPY  12/13/2018    There are no preventive care reminders to display for this patient.  Lab Results  Component Value Date   TSH 0.53 02/15/2018   Lab Results  Component Value Date   WBC 7.2 10/21/2018   HGB 14.3 10/21/2018   HCT 43.8 10/21/2018   MCV 88.0 10/21/2018   PLT 267 10/21/2018   Lab Results  Component Value Date   NA 140 01/27/2019   K 4.4 01/27/2019   CO2 28 01/27/2019   GLUCOSE 101 (H) 01/27/2019   BUN 15 01/27/2019   CREATININE 0.88 01/27/2019   BILITOT 0.3 01/27/2019   ALKPHOS 98 11/29/2016   AST 16 01/27/2019   ALT 7 01/27/2019   PROT 6.3 01/27/2019   ALBUMIN 3.6 11/29/2016   CALCIUM 8.9 01/27/2019   ANIONGAP 11 08/04/2016   Lab Results  Component Value Date   CHOL 177 07/22/2018   Lab Results  Component Value Date   HDL 59 07/22/2018   Lab Results  Component Value Date   LDLCALC 109 (H) 06/21/2018   Lab Results  Component Value Date   TRIG 106 07/22/2018   Lab Results  Component Value Date   CHOLHDL 3.9 06/21/2018   Lab Results  Component Value Date   HGBA1C 5.6 06/21/2018      Assessment & Plan:   Problem List Items Addressed This Visit      Other   Anxiety state    Doing to work on weaning her benzodiazepine use especially since she is now on chronic narcotic  therapy.  Previously filled prescription for 50 tabs of the clonazepam.  We will decrease down to 45 tabs.      Relevant Medications   clonazePAM (KLONOPIN) 1 MG tablet (Start on 05/22/2019)   Abnormal weight gain    Discussed that I really think she is under eating.  I really want her to try to eat between 1200-1400 cal/day  and try to eat throughout the day small amounts.  We discussed healthy food options we also discussed trying to eat a protein first thing in the morning.  So offered to refer her to nutritionist.  At this point I discussed with her that she really has nothing to lose I really want her to try to increase her caloric intake with healthy food options and see if she is actually able to lose weight.  In regards to the hormone therapy that certainly up to her.  She is a smoker so there is some risk with increased risk for blood clots etc. and being on estrogen and testosterone therapy.       Other Visit Diagnoses    Actinic keratosis    -  Primary   Hormone replacement therapy (HRT)       Bright red rectal bleeding         Hormone replacement therapy-she is a current smoker which does put her at increased with this week especially with being on hormone therapy.  We discussed options.  She is planning on continuing for maybe 1 more month to see if she really notices a difference in how she feels.  If at that point she is not noticing any significant benefit but I would encourage her to actually discontinue it.  She is now 48 years old and so is at the typical age that most women would experience menopause.  Lesion on forearm is consistent with a thickened actinic keratosis.  There may be some inflammation from her picking at it.  Recommended cryotherapy for treatment.  She has had this done before.  Follow-up wound care discussed.  If not healing then may need shave biopsy performed.  Blood with bowel movement.  Suspect probably related to a internal hemorrhoid.  We did discuss that  hard stools and or diarrhea can oftentimes trigger them to bleed and usually if it is bright red it is usually coming more from the rectal area.  She could also have a rectal tear that was bleeding there a little less likely with loose stools versus hard stools.  She is overdue for colonoscopy and knows that she needs to get this scheduled so we discussed that again.  She will plan to get in with them early this year.  Meds ordered this encounter  Medications  . DISCONTD: clonazePAM (KLONOPIN) 1 MG tablet    Sig: TAKE 1 TABLET BY MOUTH 2 (TWO) TIMES DAILY AS NEEDED FOR UP TO 30 DAYS FOR ANXIETY. This is a 30 days supply.    Dispense:  45 tablet    Refill:  0    OK to fill 04/22/2019  . clonazePAM (KLONOPIN) 1 MG tablet    Sig: TAKE 1 TABLET BY MOUTH 2 (TWO) TIMES DAILY AS NEEDED FOR UP TO 30 DAYS FOR ANXIETY. This is a 30 days supply.    Dispense:  45 tablet    Refill:  0    Please disregard previous prescription sent earlier today.    Follow-up: Return in about 3 months (around 08/03/2019).    Cryotherapy Procedure Note  Pre-operative Diagnosis: Actinic keratosis  Post-operative Diagnosis: Actinic keratosis  Locations: dorsum of right posteriori forearm. Indications: note healing   Anesthesia: not required    Procedure Details  Patient informed of risks (permanent scarring, infection, light or dark discoloration, bleeding, infection, weakness, numbness and recurrence of the lesion) and benefits of the procedure and verbal informed consent obtained.  The areas are treated with  liquid nitrogen therapy, frozen until ice ball extended 1-2 mm beyond lesion, allowed to thaw, and treated again. The patient tolerated procedure well.  The patient was instructed on post-op care, warned that there may be blister formation, redness and pain. Recommend OTC analgesia as needed for pain.  Condition: Stable  Complications: none.  Plan: 1. Instructed to keep the area dry and covered for  24-48h and clean thereafter. 2. Warning signs of infection were reviewed.   3. Recommended that the patient use OTC acetaminophen as needed for pain.  4. Return in 3 months.   Beatrice Lecher, MD

## 2019-05-05 NOTE — Assessment & Plan Note (Signed)
Discussed that I really think she is under eating.  I really want her to try to eat between 1200-1400 cal/day and try to eat throughout the day small amounts.  We discussed healthy food options we also discussed trying to eat a protein first thing in the morning.  So offered to refer her to nutritionist.  At this point I discussed with her that she really has nothing to lose I really want her to try to increase her caloric intake with healthy food options and see if she is actually able to lose weight.  In regards to the hormone therapy that certainly up to her.  She is a smoker so there is some risk with increased risk for blood clots etc. and being on estrogen and testosterone therapy.

## 2019-05-05 NOTE — Assessment & Plan Note (Signed)
Doing to work on weaning her benzodiazepine use especially since she is now on chronic narcotic therapy.  Previously filled prescription for 50 tabs of the clonazepam.  We will decrease down to 45 tabs.

## 2019-05-11 ENCOUNTER — Other Ambulatory Visit (HOSPITAL_COMMUNITY): Payer: Self-pay | Admitting: Psychiatry

## 2019-05-13 ENCOUNTER — Other Ambulatory Visit (HOSPITAL_COMMUNITY): Payer: Self-pay

## 2019-05-13 DIAGNOSIS — F331 Major depressive disorder, recurrent, moderate: Secondary | ICD-10-CM

## 2019-05-13 MED ORDER — DULOXETINE HCL 30 MG PO CPEP: ORAL_CAPSULE | ORAL | 0 refills | Status: DC

## 2019-05-13 MED ORDER — DULOXETINE HCL 60 MG PO CPEP: 60.0000 mg | ORAL_CAPSULE | Freq: Every day | ORAL | 0 refills | Status: DC

## 2019-05-19 ENCOUNTER — Ambulatory Visit (INDEPENDENT_AMBULATORY_CARE_PROVIDER_SITE_OTHER): Payer: Medicare Other | Admitting: Licensed Clinical Social Worker

## 2019-05-19 DIAGNOSIS — F331 Major depressive disorder, recurrent, moderate: Secondary | ICD-10-CM

## 2019-05-19 DIAGNOSIS — F4321 Adjustment disorder with depressed mood: Secondary | ICD-10-CM

## 2019-05-19 DIAGNOSIS — F4329 Adjustment disorder with other symptoms: Secondary | ICD-10-CM | POA: Diagnosis not present

## 2019-05-19 DIAGNOSIS — G894 Chronic pain syndrome: Secondary | ICD-10-CM

## 2019-05-19 DIAGNOSIS — F4310 Post-traumatic stress disorder, unspecified: Secondary | ICD-10-CM

## 2019-05-19 DIAGNOSIS — F411 Generalized anxiety disorder: Secondary | ICD-10-CM | POA: Diagnosis not present

## 2019-05-19 NOTE — Progress Notes (Signed)
THERAPIST PROGRESS NOTE  Session Time: 2:02 PM to 2:54 PM  Participation Level: Active  Behavioral Response: CasualAlertsubdued  Type of Therapy: Individual Therapy  Treatment Goals addressed:  Decrease in depression, anxiety, stress management, coping, trauma, grief  Interventions: Solution Focused, Strength-based, Supportive and Other: grief, coping  Summary: Ashley Gomez is a 49 y.o. female who presents with discussing family stressors. Discussed frustrations with son's fiancee. Comes up with complaints that she made up for attention, sees lots of reasons why she is not a good partner for her son, but shares she doesn't have a right to judge anyone. Son went to detox, living with her, can't put her son on street, though they aren't given a lot of room. Plan is for them to go to [place] patient states is the right thing for the right now to get away from this area. Wanted to share with therapist that baby [name] calling patient "Heada" stil don't know what to think about it. Still shocked.  Therapist and patient talked about how it sounded like [name], deceased daughter.  Discussed aspects of grieving and the griever finds a way to stay connected. Patient describes feeling of a presence and discussed this as a way people have felt connected. Concerns about granddaughter from good mood to really angry and doesn't need to be like as a little girl at 57.  Positive step is she is going to counseling and discussed ways would be helpful to patient to be involved make sure she gets the help she needs, patient helped by describing what she sees, and therapist pointing out may help her also knowing how to help her granddaughter.  Patient discussed medicine that she can tell when she does not take depression medicine and needs to change it.  She is scheduled to see Dr. [name] tomorrow. It is working, Academic librarian work but one is Ecologist. Knows when not taking one because when she  doesn't thinks about things, cant get upset at the "drop of the hat".    Suicidal/Homicidal: No  Therapist Response: Therapist reviewed symptoms, discussed stressors, facilitated expression of thoughts and feelings.  Provided education on grief including it is not something like a sentence.  That and but a process that shapes, shifts and last a lifetime and cannot in rituals.  That there are 2 stages of grief when we are feeling bad and the other where you are feeling better which extends for the rest of your life does not have a name so we have not had vocabulary to talk about it and acknowledges that this is something we do not get over.  We find new in different ways to carry her love ones forward with [name] and that is what I am calling the after grief.  If that person is going to be showing up in your life anyway, you are not crazy if you are going to interact with that, what ever that is the memory.  Gust different ways people showing up and finding connection means different things for different people.  The relationship with area of grief the idea of trying to like over attachment of loved ones that died was causing more pain and comfort.  Naturally what we try to do is find new ways to stay connected to that these loved ones without them in the physical world.  Around the holidays Can be simple is telling stories about them, continuing a tradition.  Traditions and rituals or elements from the past  that we can engage in and hope we will continue them in the future.  Processed feelings related to family stressors and discuss coping strategies for family stressors.  Provided strength based and supportive intervention  Plan: Return again in 2 weeks.2. Therapist work with effective coping strategies for stressors, mood regulation, coping  Diagnosis: Axis I:   generalized anxiety disorder PTSD, complicated grief, chronic pain syndrome, Major depressive disorder, recurrent moderate    Axis II: No  diagnosis    Cordella Register, LCSW 05/19/2019

## 2019-05-20 ENCOUNTER — Encounter (HOSPITAL_COMMUNITY): Payer: Self-pay | Admitting: Psychiatry

## 2019-05-20 ENCOUNTER — Ambulatory Visit (INDEPENDENT_AMBULATORY_CARE_PROVIDER_SITE_OTHER): Payer: Medicare Other | Admitting: Psychiatry

## 2019-05-20 DIAGNOSIS — F331 Major depressive disorder, recurrent, moderate: Secondary | ICD-10-CM | POA: Diagnosis not present

## 2019-05-20 DIAGNOSIS — F4329 Adjustment disorder with other symptoms: Secondary | ICD-10-CM

## 2019-05-20 DIAGNOSIS — F411 Generalized anxiety disorder: Secondary | ICD-10-CM

## 2019-05-20 DIAGNOSIS — G8929 Other chronic pain: Secondary | ICD-10-CM | POA: Diagnosis not present

## 2019-05-20 DIAGNOSIS — F4310 Post-traumatic stress disorder, unspecified: Secondary | ICD-10-CM

## 2019-05-20 DIAGNOSIS — F4321 Adjustment disorder with depressed mood: Secondary | ICD-10-CM
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
## 2019-05-31 ENCOUNTER — Encounter: Payer: Self-pay | Admitting: Family Medicine

## 2019-06-02 DIAGNOSIS — F17210 Nicotine dependence, cigarettes, uncomplicated: Secondary | ICD-10-CM | POA: Diagnosis not present

## 2019-06-02 DIAGNOSIS — Z79899 Other long term (current) drug therapy: Secondary | ICD-10-CM | POA: Diagnosis not present

## 2019-06-02 DIAGNOSIS — L821 Other seborrheic keratosis: Secondary | ICD-10-CM | POA: Diagnosis not present

## 2019-06-02 DIAGNOSIS — L853 Xerosis cutis: Secondary | ICD-10-CM | POA: Diagnosis not present

## 2019-06-02 DIAGNOSIS — M545 Low back pain: Secondary | ICD-10-CM | POA: Diagnosis not present

## 2019-06-02 DIAGNOSIS — C44629 Squamous cell carcinoma of skin of left upper limb, including shoulder: Secondary | ICD-10-CM | POA: Diagnosis not present

## 2019-06-02 DIAGNOSIS — Z79891 Long term (current) use of opiate analgesic: Secondary | ICD-10-CM | POA: Diagnosis not present

## 2019-06-02 DIAGNOSIS — L814 Other melanin hyperpigmentation: Secondary | ICD-10-CM | POA: Diagnosis not present

## 2019-06-02 DIAGNOSIS — L309 Dermatitis, unspecified: Secondary | ICD-10-CM | POA: Diagnosis not present

## 2019-06-02 DIAGNOSIS — G8929 Other chronic pain: Secondary | ICD-10-CM | POA: Diagnosis not present

## 2019-06-13 ENCOUNTER — Other Ambulatory Visit (HOSPITAL_COMMUNITY): Payer: Self-pay

## 2019-06-13 ENCOUNTER — Encounter: Payer: Self-pay | Admitting: Family Medicine

## 2019-06-13 DIAGNOSIS — F411 Generalized anxiety disorder: Secondary | ICD-10-CM

## 2019-06-13 DIAGNOSIS — F331 Major depressive disorder, recurrent, moderate: Secondary | ICD-10-CM

## 2019-06-13 MED ORDER — DULOXETINE HCL 60 MG PO CPEP: 60.0000 mg | ORAL_CAPSULE | Freq: Every day | ORAL | 0 refills | Status: DC

## 2019-06-13 MED ORDER — DULOXETINE HCL 30 MG PO CPEP: ORAL_CAPSULE | ORAL | 0 refills | Status: DC

## 2019-06-16 ENCOUNTER — Ambulatory Visit (INDEPENDENT_AMBULATORY_CARE_PROVIDER_SITE_OTHER): Payer: Medicare Other | Admitting: Licensed Clinical Social Worker

## 2019-06-16 DIAGNOSIS — G894 Chronic pain syndrome: Secondary | ICD-10-CM

## 2019-06-16 DIAGNOSIS — F4381 Prolonged grief disorder: Secondary | ICD-10-CM

## 2019-06-16 DIAGNOSIS — F4329 Adjustment disorder with other symptoms: Secondary | ICD-10-CM | POA: Diagnosis not present

## 2019-06-16 DIAGNOSIS — F4321 Adjustment disorder with depressed mood: Secondary | ICD-10-CM

## 2019-06-16 DIAGNOSIS — F331 Major depressive disorder, recurrent, moderate: Secondary | ICD-10-CM | POA: Diagnosis not present

## 2019-06-16 DIAGNOSIS — F411 Generalized anxiety disorder: Secondary | ICD-10-CM | POA: Diagnosis not present

## 2019-06-16 DIAGNOSIS — F4310 Post-traumatic stress disorder, unspecified: Secondary | ICD-10-CM | POA: Diagnosis not present

## 2019-06-16 MED ORDER — CLONAZEPAM 1 MG PO TABS: ORAL_TABLET | ORAL | 0 refills | Status: DC

## 2019-06-16 NOTE — Telephone Encounter (Signed)
Med refilled.

## 2019-06-16 NOTE — Progress Notes (Signed)
THERAPIST PROGRESS NOTE  Session Time: 2:05 PM to 2:57 PM  Participation Level: Active  Behavioral Response: CasualAlertAnxious  Type of Therapy: Individual Therapy  Treatment Goals addressed:  Decrease in depression, anxiety, stress management, coping, trauma, grief  Interventions: Solution Focused, Strength-based, Supportive, Reframing and Other: coping  Summary: Ashley Gomez is a 49 y.o. female who presents sister had a heart attack on Saturday and gave therapist background health condition including having a heart attack once before.  This is her twin as she describes she is the only one patient has in the world.  Patient is not getting any information describes feelings of being panicky because she has no way to contact sister, particularly hard with Covid.  Not getting any information, when talking with her sister having hard time breathing, although today doing better.  Patient plans to go see her on Friday in [place] does not know how long she will stay.  Shared person who she had a relationship early on in life around 2000 for about 8 years in [place] she helped to raise children just found out he died.  Also another friend passed was told probably an OD.  Patient describes first coming in session as busy and then pointed out to therapist that she has nobody to ask her how she is doing and shrugged her shoulders.  Therapist responded by asking her how she was doing?  Reviewed another stressor is aggressive skin cancer but not forefront of anxiety and knows there is a treatment that cause for significant amount of stress currently.  Discussed ongoing stress of her son [name] and [name] fianc being at her house.  They would have a place to stay if she was not generous and allowing them to stay there and she is paying for everything and they are inconsiderate and show no appreciation.  Reviewed did not sleep Saturday night because so worried about sister.  Asked [name] and [name] to do  simple tasks they are not able to complete yesterday frustrated and "went off on them".  He has concerns about what the place for look like when she lays flat has to "let it go until I come back."  Shares worries about Covid now the variance of Covid, 3 people in her family including 2 grandchildren have been exposed to Covid.(Therapist used interventions to recognize what is and what is nodding her control to cope.  Reviewed being with [name] and [name] that she does not know what they are going to do not going to worry about it.  Therapist provided positive feedback as effective coping.   Suicidal/Homicidal: No  Therapist Response: Therapist reviewed symptoms, discussed stressors, facilitated expression of thoughts and feelings.  Reviewed coping strategies for stressors and reinforced patient prioritizing stressors as a strategy that will help manage.  Continue to encourage patient to set boundaries with son and fianc and validated how she was feeling about her lack of appreciation and lack of consideration as guests.  Utilize strength-based intervention in pointing out ways she has helped them when they had nowhere else to tired but at the same time reinforcing patient's insight that they need to figure out their own life, patient has phone issues to worry about.  Focus on patient's wellbeing particularly because she shares her twin is the only person she has in the world, has had recent losses and has no supports to see how she is doing.  Reviewed that going to stay near her sister will help her with mental  health as she will be there to help out anyway possible and also better know what is going on.  Processed patient's feelings related to losses.  Plan: Return again in 4 weeks.2.Therapist work with effective coping strategies for stressors, mood regulation, coping Diagnosis: Axis I:  generalized anxiety disorder PTSD, complicated grief, chronic pain syndrome, Major depressive disorder, recurrent  moderate     Axis II: No diagnosis    Cordella Register, LCSW 06/16/2019

## 2019-06-17 DIAGNOSIS — Z79891 Long term (current) use of opiate analgesic: Secondary | ICD-10-CM | POA: Diagnosis not present

## 2019-06-17 DIAGNOSIS — Z79899 Other long term (current) drug therapy: Secondary | ICD-10-CM | POA: Diagnosis not present

## 2019-06-17 DIAGNOSIS — G8929 Other chronic pain: Secondary | ICD-10-CM | POA: Diagnosis not present

## 2019-06-17 DIAGNOSIS — M545 Low back pain: Secondary | ICD-10-CM | POA: Diagnosis not present

## 2019-06-17 DIAGNOSIS — F17210 Nicotine dependence, cigarettes, uncomplicated: Secondary | ICD-10-CM | POA: Diagnosis not present

## 2019-07-01 ENCOUNTER — Other Ambulatory Visit: Payer: Self-pay

## 2019-07-01 ENCOUNTER — Ambulatory Visit (INDEPENDENT_AMBULATORY_CARE_PROVIDER_SITE_OTHER): Payer: Medicare Other | Admitting: Psychiatry

## 2019-07-01 ENCOUNTER — Encounter (HOSPITAL_COMMUNITY): Payer: Self-pay | Admitting: Psychiatry

## 2019-07-01 DIAGNOSIS — F411 Generalized anxiety disorder: Secondary | ICD-10-CM

## 2019-07-01 DIAGNOSIS — F4310 Post-traumatic stress disorder, unspecified: Secondary | ICD-10-CM | POA: Diagnosis not present

## 2019-07-01 DIAGNOSIS — F4321 Adjustment disorder with depressed mood: Secondary | ICD-10-CM

## 2019-07-01 DIAGNOSIS — F4329 Adjustment disorder with other symptoms: Secondary | ICD-10-CM | POA: Diagnosis not present

## 2019-07-01 DIAGNOSIS — F331 Major depressive disorder, recurrent, moderate: Secondary | ICD-10-CM | POA: Diagnosis not present

## 2019-07-01 DIAGNOSIS — G894 Chronic pain syndrome: Secondary | ICD-10-CM
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
## 2019-07-14 ENCOUNTER — Ambulatory Visit (HOSPITAL_COMMUNITY): Payer: Medicare Other | Admitting: Licensed Clinical Social Worker

## 2019-07-23 DIAGNOSIS — C44629 Squamous cell carcinoma of skin of left upper limb, including shoulder: Secondary | ICD-10-CM | POA: Diagnosis not present

## 2019-07-28 ENCOUNTER — Ambulatory Visit (INDEPENDENT_AMBULATORY_CARE_PROVIDER_SITE_OTHER): Payer: Medicare Other | Admitting: Licensed Clinical Social Worker

## 2019-07-28 DIAGNOSIS — F17210 Nicotine dependence, cigarettes, uncomplicated: Secondary | ICD-10-CM | POA: Diagnosis not present

## 2019-07-28 DIAGNOSIS — F4310 Post-traumatic stress disorder, unspecified: Secondary | ICD-10-CM | POA: Diagnosis not present

## 2019-07-28 DIAGNOSIS — F4381 Prolonged grief disorder: Secondary | ICD-10-CM

## 2019-07-28 DIAGNOSIS — G894 Chronic pain syndrome: Secondary | ICD-10-CM

## 2019-07-28 DIAGNOSIS — Z79899 Other long term (current) drug therapy: Secondary | ICD-10-CM | POA: Diagnosis not present

## 2019-07-28 DIAGNOSIS — G8929 Other chronic pain: Secondary | ICD-10-CM | POA: Diagnosis not present

## 2019-07-28 DIAGNOSIS — F4329 Adjustment disorder with other symptoms: Secondary | ICD-10-CM

## 2019-07-28 DIAGNOSIS — M545 Low back pain: Secondary | ICD-10-CM | POA: Diagnosis not present

## 2019-07-28 DIAGNOSIS — F4321 Adjustment disorder with depressed mood: Secondary | ICD-10-CM

## 2019-07-28 DIAGNOSIS — F411 Generalized anxiety disorder: Secondary | ICD-10-CM | POA: Diagnosis not present

## 2019-07-28 DIAGNOSIS — F331 Major depressive disorder, recurrent, moderate: Secondary | ICD-10-CM

## 2019-07-28 DIAGNOSIS — Z79891 Long term (current) use of opiate analgesic: Secondary | ICD-10-CM | POA: Diagnosis not present
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
## 2019-08-04 ENCOUNTER — Ambulatory Visit (INDEPENDENT_AMBULATORY_CARE_PROVIDER_SITE_OTHER): Payer: Medicare Other | Admitting: Family Medicine

## 2019-08-04 ENCOUNTER — Encounter: Payer: Self-pay | Admitting: Family Medicine

## 2019-08-04 ENCOUNTER — Other Ambulatory Visit: Payer: Self-pay

## 2019-08-04 VITALS — BP 137/84 | HR 94 | Ht 66.0 in | Wt 291.0 lb

## 2019-08-04 DIAGNOSIS — R635 Abnormal weight gain: Secondary | ICD-10-CM

## 2019-08-04 DIAGNOSIS — K2100 Gastro-esophageal reflux disease with esophagitis, without bleeding: Secondary | ICD-10-CM

## 2019-08-04 DIAGNOSIS — C4492 Squamous cell carcinoma of skin, unspecified: Secondary | ICD-10-CM | POA: Diagnosis not present

## 2019-08-04 DIAGNOSIS — E785 Hyperlipidemia, unspecified: Secondary | ICD-10-CM

## 2019-08-04 DIAGNOSIS — R7301 Impaired fasting glucose: Secondary | ICD-10-CM

## 2019-08-04 DIAGNOSIS — Z6841 Body Mass Index (BMI) 40.0 and over, adult: Secondary | ICD-10-CM

## 2019-08-04 DIAGNOSIS — G43019 Migraine without aura, intractable, without status migrainosus: Secondary | ICD-10-CM | POA: Diagnosis not present

## 2019-08-04 DIAGNOSIS — I10 Essential (primary) hypertension: Secondary | ICD-10-CM

## 2019-08-04 LAB — CBC
HCT: 38.1 % (ref 35.0–45.0)
Hemoglobin: 12.2 g/dL (ref 11.7–15.5)
MCH: 25.6 pg — ABNORMAL LOW (ref 27.0–33.0)
MCHC: 32 g/dL (ref 32.0–36.0)
MCV: 79.9 fL — ABNORMAL LOW (ref 80.0–100.0)
MPV: 10.3 fL (ref 7.5–12.5)
Platelets: 298 10*3/uL (ref 140–400)
RBC: 4.77 10*6/uL (ref 3.80–5.10)
RDW: 15.5 % — ABNORMAL HIGH (ref 11.0–15.0)
WBC: 7.9 10*3/uL (ref 3.8–10.8)

## 2019-08-04 LAB — COMPLETE METABOLIC PANEL WITH GFR
AG Ratio: 1.3 (calc) (ref 1.0–2.5)
ALT: 16 U/L (ref 6–29)
AST: 20 U/L (ref 10–35)
Albumin: 3.6 g/dL (ref 3.6–5.1)
Alkaline phosphatase (APISO): 95 U/L (ref 37–153)
BUN: 23 mg/dL (ref 7–25)
CO2: 30 mmol/L (ref 20–32)
Calcium: 8.9 mg/dL (ref 8.6–10.4)
Chloride: 102 mmol/L (ref 98–110)
Creat: 0.78 mg/dL (ref 0.50–1.05)
GFR, Est African American: 103 mL/min/{1.73_m2} (ref >=60)
GFR, Est Non African American: 89 mL/min/{1.73_m2} (ref >=60)
Globulin: 2.8 g/dL (calc) (ref 1.9–3.7)
Glucose, Bld: 112 mg/dL — ABNORMAL HIGH (ref 65–99)
Potassium: 4.9 mmol/L (ref 3.5–5.3)
Sodium: 138 mmol/L (ref 135–146)
Total Bilirubin: 0.3 mg/dL (ref 0.2–1.2)
Total Protein: 6.4 g/dL (ref 6.1–8.1)

## 2019-08-04 LAB — LIPID PANEL
Cholesterol: 163 mg/dL (ref <200)
HDL: 42 mg/dL — ABNORMAL LOW (ref >=50)
LDL Cholesterol (Calc): 98 mg/dL (calc)
Non-HDL Cholesterol (Calc): 121 mg/dL (calc) (ref <130)
Total CHOL/HDL Ratio: 3.9 (calc) (ref <5.0)
Triglycerides: 133 mg/dL (ref <150)

## 2019-08-04 LAB — TSH: TSH: 1.1 mIU/L

## 2019-08-04 MED ORDER — ONDANSETRON HCL 4 MG PO TABS: 4.0000 mg | ORAL_TABLET | Freq: Three times a day (TID) | ORAL | 1 refills | Status: DC | PRN

## 2019-08-04 MED ORDER — OMEPRAZOLE 40 MG PO CPDR: DELAYED_RELEASE_CAPSULE | ORAL | 3 refills | Status: DC

## 2019-08-04 MED ORDER — AIMOVIG 140 MG/ML ~~LOC~~ SOAJ: 140.0000 mg | SUBCUTANEOUS | 3 refills | Status: DC

## 2019-08-04 MED ORDER — FAMOTIDINE 20 MG PO TABS: 20.0000 mg | ORAL_TABLET | Freq: Every day | ORAL | 3 refills | Status: DC

## 2019-08-04 NOTE — Assessment & Plan Note (Signed)
Will refill GERD medications.  It does sound like she is having some breakthrough symptoms and would benefit from referral to GI.  She has a lot going on right now so we will address again at follow-up.

## 2019-08-04 NOTE — Assessment & Plan Note (Signed)
Pressure fair today.  Not as optimal as I would like to see it but okay.  Continue current regimen.  Due for labs.

## 2019-08-04 NOTE — Assessment & Plan Note (Signed)
Neck thyroid level.  Refer to comprehensive bariat[facility] clinic for weight loss.

## 2019-08-04 NOTE — Progress Notes (Signed)
Established Patient Office Visit  Subjective:  Patient ID: Ashley Gomez, female    DOB: 02-Jul-1970  Age: 49 y.o. MRN: [id number]  CC:  Chief Complaint  Patient presents with  . Hypertension    HPI Ashley Gomez presents for   F/U Migraines - she feels like the Aimovig has been working well until recently with stress.  Since her stress levels have been up she has had more migraine headaches.  But again was doing well previously she would like a refill on the medication.  Otherwise tolerating it well without side effects.  Hypertension- Pt denies chest pain, SOB, dizziness, or heart palpitations.  Taking meds as directed w/o problems.  Denies medication side effects.    She would also like a referral to dermatology.  She had a lesion on the dorsum of her left forearm.  In fact we performed cryotherapy on it here.  She said it eventually came back so she saw the dermatologist and they ended up biopsying it came back positive for squamous cell and the ended up having to do a deep marginal excision.  She follows back up in about 2 weeks to have her sutures removed.  Unfortunately she actually popped a couple of the sutures as it caught on one of her grandchildren socks.  GERD-she would like a refill on her famotidine, omeprazole and Zofran.  Actually been having some breakthrough symptoms and says she has been avoiding greasy, spicy foods and acidic food she has been avoiding caffeine and soda.  She has had some intermittent diarrhea as well.  She also had a stomach virus about 2 weeks ago.  She is also been under a lot of stress recently.  She was visiting her sister in [place] for a missed a month who had a heart attack and almost died while her son and daughter-in-law actually moved into her house temporarily.  Her daughter-in-law actually OD on heroin and left behind a 59-monthold and 51year-old and 51year-old.  KLetticiahas been taking care of the grandchildren for the last 2 weeks and so  feels exhausted.she is still seeing her therapist.    She is also discerned about her weight gain.  She continues to gain weight she feels like some of it is probably menopause related but she feels like something else is going on.  She says she usually eats less than 1000 cal/day.  Past Medical History:  Diagnosis Date  . Anxiety   . Arthritis   . CVA (cerebral infarction)    Old CVA on MRI brain  . Depression   . D[place]deep [place]) [place]  L basilic vein   . Facet hypertrophy of lumbar region    MRI 2007  . Fibromyalgia   . GERD (gastroesophageal reflux disease)   . Hypertension    no meds now, lost 50lbs  . Migraines   . MS (multiple sclerosis) ([facility]   . Neuromuscular disorder ([facility]   . Obesity   . PTSD (post-traumatic stress disorder)   . Seizures ([facility] 2011   no seizure since onset  . Sleep apnea    diagnosed 20 ago, lost wt no CPAP now  . Smoking   . Stroke ([facility]    doesn't know when    Past Surgical History:  Procedure Laterality Date  . CHOLECYSTECTOMY  10/2017  . CHONDROPLASTY Left 12/30/2014   Procedure: CHONDROPLASTY;  Surgeon: NLeandrew Koyanagi MD;  [place]  Service: Orthopedics;  Laterality:  Left;  . KNEE ARTHROSCOPY Left 12/30/2014   Procedure: LEFT KNEE ARTHROSCOPY WITH   CHONDROPLASTY;  Surgeon: Leandrew Koyanagi, MD;  Location: [place];  Service: Orthopedics;  Laterality: Left;  . PARTIAL KNEE ARTHROPLASTY Left 08/03/2016   Procedure: LEFT UNICOMPARTMENTAL KNEE ARTHROPLASTY;  Surgeon: Leandrew Koyanagi, MD;  Location: [place];  Service: Orthopedics;  Laterality: Left;  . TONSILLECTOMY    . TUBAL LIGATION  1992    Family History  Problem Relation Age of Onset  . Cancer Mother 31       melanoma  . Hypertension Sister   . Heart disease Sister        valve disease  . Depression Sister   . Diabetes Sister   . Sjogren's syndrome Sister     Social History   Socioeconomic History  . Marital status:  Single    Spouse name: Not on file  . Number of children: Not on file  . Years of education: Not on file  . Highest education level: Not on file  Occupational History  . Not on file  Tobacco Use  . Smoking status: Current Every Day Smoker    Packs/day: 1.00    Years: 25.00    Pack years: 25.00    Types: Cigarettes  . Smokeless tobacco: Never Used  . Tobacco comment: advised to d/c by 3 cigs per week.  Substance and Sexual Activity  . Alcohol use: No    Alcohol/week: 0.0 standard drinks    Comment: occasional  . Drug use: No  . Sexual activity: Yes    Birth control/protection: None, Post-menopausal  Other Topics Concern  . Not on file  Social History Narrative  . Not on file   Social Determinants of Health   Financial Resource Strain:   . Difficulty of Paying Living Expenses:   Food Insecurity:   . Worried About Charity fundraiser in the Last Year:   . Arboriculturist in the Last Year:   Transportation Needs:   . Film/video editor (Medical):   Marland Kitchen Lack of Transportation (Non-Medical):   Physical Activity:   . Days of Exercise per Week:   . Minutes of Exercise per Session:   Stress:   . Feeling of Stress :   Social Connections:   . Frequency of Communication with Friends and Family:   . Frequency of Social Gatherings with Friends and Family:   . Attends Religious Services:   . Active Member of Clubs or Organizations:   . Attends Archivist Meetings:   Marland Kitchen Marital Status:   Intimate Partner Violence:   . Fear of Current or Ex-Partner:   . Emotionally Abused:   Marland Kitchen Physically Abused:   . Sexually Abused:     Outpatient Medications Prior to Visit  Medication Sig Dispense Refill  . ammonium lactate (LAC-HYDRIN) 12 % lotion See admin instructions.    . cyclobenzaprine (FLEXERIL) 10 MG tablet Take 10 mg by mouth 2 (two) times daily as needed.    Marland Kitchen acetaminophen (TYLENOL) 500 MG tablet Take 1,000 mg by mouth every 6 (six) hours as needed for mild pain or  headache.    . AMBULATORY NON FORMULARY MEDICATION APPLY 1 PUMP (0.5ML) TO EACH SHOULDER, INNER WRIST OR THIGH TWICE A DAY (MAX 4 PUMPS PER DAY)    . azelastine (ASTELIN) 0.1 % nasal spray Place 2 sprays into both nostrils 2 (two) times daily. Use in each nostril as directed 30 mL 12  .  busPIRone (BUSPAR) 7.5 MG tablet TAKE 1 TABLET BY MOUTH 2 TIMES DAILY AS NEEDED. 180 tablet 0  . celecoxib (CELEBREX) 200 MG capsule Take 2 capsules by mouth as needed for pain.  Needs appointment 180 capsule 0  . CHANTIX 1 MG tablet TAKE 1 TABLET BY MOUTH TWICE A DAY 180 tablet 1  . clonazePAM (KLONOPIN) 1 MG tablet TAKE 1 TABLET BY MOUTH 2 (TWO) TIMES DAILY AS NEEDED FOR UP TO 30 DAYS FOR ANXIETY. This is a 30 days supply. 45 tablet 0  . diclofenac sodium (VOLTAREN) 1 % GEL See admin instructions.    . DULoxetine (CYMBALTA) 30 MG capsule TAKE 1 CAPSULE BY MOUTH DAILY. THIS IS IN ADDITION TO 60MG TAKING DAILY. TOTAL DOSE BE 90 MG 30 capsule 0  . DULoxetine (CYMBALTA) 60 MG capsule Take 1 capsule (60 mg total) by mouth daily. 30 capsule 0  . HYDROcodone-acetaminophen (NORCO) 10-325 MG tablet Take 1 tablet by mouth every 6 (six) hours as needed for pain. for pain  0  . levocetirizine (XYZAL) 5 MG tablet Take 5 mg by mouth every evening.    . metoprolol tartrate (LOPRESSOR) 25 MG tablet TAKE 1/2 TABLET TWICE A DAY 30 tablet 0  . NARCAN 4 MG/0.1ML LIQD nasal spray kit USE AS DIRECTED    . rizatriptan (MAXALT-MLT) 10 MG disintegrating tablet Take 1 tablet (10 mg total) by mouth daily as needed. May repeat in 2 hours if needed 10 tablet 4  . Triamcinolone Acetonide (TRIAMCINOLONE 0.1 % CREAM : EUCERIN) CREA Apply 1 application topically at bedtime. 1 Jar. 1 each 1  . AMBULATORY NON FORMULARY MEDICATION Apply 0.5 mLs topically 2 (two) times daily. Medication Name: Biest 0.5 mL 1 pump each shoulder,inner wrist,or thigh BID. MAX 4 PUMPS    . Erenumab-aooe (AIMOVIG) 140 MG/ML SOAJ Inject 140 mg as directed every 30 (thirty)  days. 1 pen 3  . famotidine (PEPCID) 20 MG tablet Take 1 tablet (20 mg total) by mouth daily. 90 tablet 3  . mupirocin ointment (BACTROBAN) 2 % Apply topically 2 (two) times daily. 30 g 0  . omeprazole (PRILOSEC) 40 MG capsule TAKE 1 CAPSULE BY MOUTH EVERY DAY 90 capsule 3  . ondansetron (ZOFRAN) 4 MG tablet Take 1 tablet (4 mg total) by mouth every 8 (eight) hours as needed for nausea or vomiting. 30 tablet 1  . tiZANidine (ZANAFLEX) 4 MG tablet Take 4 mg by mouth at bedtime.    . Vitamin D, Ergocalciferol, (DRISDOL) 1.25 MG (50000 UT) CAPS capsule Take 1 capsule (50,000 Units total) by mouth every 7 (seven) days. 12 capsule 0   No facility-administered medications prior to visit.    Allergies  Allergen Reactions  . Ace Inhibitors     REACTION: cough  . Atenolol Other (See Comments)    Bradycardia  . Celexa [Citalopram Hydrobromide] Other (See Comments)    Dizziness Tolerated Lexapro   . Codeine Nausea Only  . Nitroglycerin Other (See Comments)    Drop in BP  . Phenytoin Swelling  . Sertraline Other (See Comments)    unknown  . Sumatriptan Nausea Only  . Tizanidine Other (See Comments)    Keep her awake.   . Topamax [Topiramate] Other (See Comments)    She reports that this makes her forgetful and causes her to studder  . Triamcinolone     Steroid flare after joint injection, use other steroid for injections    ROS Review of Systems    Objective:  Physical Exam  Constitutional: She is oriented to person, place, and time. She appears well-developed and well-nourished.  HENT:  Head: Normocephalic and atraumatic.  Cardiovascular: Normal rate, regular rhythm and normal heart sounds.  Pulmonary/Chest: Effort normal and breath sounds normal.  Neurological: She is alert and oriented to person, place, and time.  Skin: Skin is warm and dry.  Psychiatric: She has a normal mood and affect. Her behavior is normal.    BP 137/84   Pulse 94   Ht _0  (1.676 m)   Wt 291 lb  (132 kg)   LMP 12/28/2014   SpO2 97%   BMI 46.97 kg/m  Wt Readings from Last 3 Encounters:  08/04/19 291 lb (132 kg)  05/05/19 288 lb (130.6 kg)  04/04/19 281 lb (127.5 kg)     Health Maintenance Due  Topic Date Due  . COLONOSCOPY  Never done    There are no preventive care reminders to display for this patient.  Lab Results  Component Value Date   TSH 0.53 02/15/2018   Lab Results  Component Value Date   WBC 7.2 10/21/2018   HGB 14.3 10/21/2018   HCT 43.8 10/21/2018   MCV 88.0 10/21/2018   PLT 267 10/21/2018   Lab Results  Component Value Date   NA 140 01/27/2019   K 4.4 01/27/2019   CO2 28 01/27/2019   GLUCOSE 101 (H) 01/27/2019   BUN 15 01/27/2019   CREATININE 0.88 01/27/2019   BILITOT 0.3 01/27/2019   ALKPHOS 98 11/29/2016   AST 16 01/27/2019   ALT 7 01/27/2019   PROT 6.3 01/27/2019   ALBUMIN 3.6 11/29/2016   CALCIUM 8.9 01/27/2019   ANIONGAP 11 08/04/2016   Lab Results  Component Value Date   CHOL 177 07/22/2018   Lab Results  Component Value Date   HDL 59 07/22/2018   Lab Results  Component Value Date   LDLCALC 109 (H) 06/21/2018   Lab Results  Component Value Date   TRIG 106 07/22/2018   Lab Results  Component Value Date   CHOLHDL 3.9 06/21/2018   Lab Results  Component Value Date   HGBA1C 5.6 06/21/2018      Assessment & Plan:   Problem List Items Addressed This Visit      Cardiovascular and Mediastinum   Migraine without aura   Relevant Medications   cyclobenzaprine (FLEXERIL) 10 MG tablet   Erenumab-aooe (AIMOVIG) 140 MG/ML SOAJ   ESSENTIAL HYPERTENSION, BENIGN - Primary    Pressure fair today.  Not as optimal as I would like to see it but okay.  Continue current regimen.  Due for labs.      Relevant Orders   COMPLETE METABOLIC PANEL WITH GFR   Lipid panel   CBC   TSH     Digestive   GERD (gastroesophageal reflux disease)    Will refill GERD medications.  It does sound like she is having some breakthrough  symptoms and would benefit from referral to GI.  She has a lot going on right now so we will address again at follow-up.      Relevant Medications   ondansetron (ZOFRAN) 4 MG tablet   omeprazole (PRILOSEC) 40 MG capsule   famotidine (PEPCID) 20 MG tablet     Endocrine   IFG (impaired fasting glucose)    For repeat hemoglobin A1c.  Last one was about a year ago and it was 5.9.      Relevant Orders   Hemoglobin A1c  Musculoskeletal and Integument   Squamous cell skin cancer    Removed from left posterior forearm.  Following with dermatology and skin center in [place].  Going up in 2 weeks for suture removal.  Formal referral placed.      Relevant Medications   ondansetron (ZOFRAN) 4 MG tablet   Other Relevant Orders   Ambulatory referral to Dermatology     Other   Dyslipidemia    Due to recheck lipid levels.      BMI 45.0-49.9, adult (HCC)    Neck thyroid level.  Refer to comprehensive bariatric clinic for weight loss.      Relevant Orders   Ambulatory referral to [facility]   Abnormal weight gain   Relevant Orders   COMPLETE METABOLIC PANEL WITH GFR   Lipid panel   CBC   TSH   Ambulatory referral to Family Practice     Abormal weight gain-unclear etiology.  It has been over a year since we checked her thyroid so we will recheck that today.  We also discussed possible referral to.  Attrition for consultation.  It sounds like she really eats a small amount daily.  Activity level is up and down but not consistent because of some chronic pain issues.  Meds ordered this encounter  Medications  . ondansetron (ZOFRAN) 4 MG tablet    Sig: Take 1 tablet (4 mg total) by mouth every 8 (eight) hours as needed for nausea or vomiting.    Dispense:  30 tablet    Refill:  1  . omeprazole (PRILOSEC) 40 MG capsule    Sig: TAKE 1 CAPSULE BY MOUTH EVERY DAY    Dispense:  90 capsule    Refill:  3  . Erenumab-aooe (AIMOVIG) 140 MG/ML SOAJ    Sig: Inject 140 mg as  directed every 30 (thirty) days.    Dispense:  1 pen    Refill:  3  . famotidine (PEPCID) 20 MG tablet    Sig: Take 1 tablet (20 mg total) by mouth daily.    Dispense:  90 tablet    Refill:  3    Ok to fill 08/31/2019    Follow-up: Return in about 4 months (around 12/04/2019) for Hypertension and Migraines.   Time spent 40 minutes in encounter including reviewing notes.  Beatrice Lecher, MD

## 2019-08-04 NOTE — Assessment & Plan Note (Addendum)
Removed from left posterior forearm.  Following with dermatology and skin center in [place].  Going up in 2 weeks for suture removal.  Formal referral placed.

## 2019-08-04 NOTE — Assessment & Plan Note (Signed)
For repeat hemoglobin A1c.  Last one was about a year ago and it was 5.9.

## 2019-08-04 NOTE — Assessment & Plan Note (Signed)
Due to recheck lipid levels. 

## 2019-08-05 ENCOUNTER — Encounter: Payer: Self-pay | Admitting: Family Medicine

## 2019-08-05 LAB — HEMOGLOBIN A1C
Hgb A1c MFr Bld: 5.8 % of total Hgb — ABNORMAL HIGH (ref <5.7)
Mean Plasma Glucose: 120 (calc)
eAG (mmol/L): 6.6 (calc)

## 2019-08-09 ENCOUNTER — Other Ambulatory Visit (HOSPITAL_COMMUNITY): Payer: Self-pay | Admitting: Psychiatry

## 2019-08-09 DIAGNOSIS — F331 Major depressive disorder, recurrent, moderate: Secondary | ICD-10-CM

## 2019-08-11 ENCOUNTER — Telehealth (HOSPITAL_COMMUNITY): Payer: Self-pay | Admitting: Psychiatry

## 2019-08-11 NOTE — Telephone Encounter (Signed)
Pt needs refills on Cymbalta 30mg  & 60mg . 90 day supply [facility]    [phone]

## 2019-08-12 NOTE — Telephone Encounter (Signed)
rx has been sent 

## 2019-08-18 ENCOUNTER — Other Ambulatory Visit: Payer: Self-pay

## 2019-08-18 ENCOUNTER — Other Ambulatory Visit: Payer: Self-pay | Admitting: Family Medicine

## 2019-08-18 ENCOUNTER — Ambulatory Visit (HOSPITAL_COMMUNITY): Payer: Medicare Other | Admitting: Licensed Clinical Social Worker

## 2019-08-18 DIAGNOSIS — F411 Generalized anxiety disorder: Secondary | ICD-10-CM

## 2019-08-26 DIAGNOSIS — Z79899 Other long term (current) drug therapy: Secondary | ICD-10-CM | POA: Diagnosis not present

## 2019-08-26 DIAGNOSIS — Z79891 Long term (current) use of opiate analgesic: Secondary | ICD-10-CM | POA: Diagnosis not present

## 2019-08-26 DIAGNOSIS — F17210 Nicotine dependence, cigarettes, uncomplicated: Secondary | ICD-10-CM | POA: Diagnosis not present

## 2019-08-26 DIAGNOSIS — G8929 Other chronic pain: Secondary | ICD-10-CM | POA: Diagnosis not present

## 2019-08-26 DIAGNOSIS — J3089 Other allergic rhinitis: Secondary | ICD-10-CM | POA: Diagnosis not present

## 2019-08-26 DIAGNOSIS — Z1159 Encounter for screening for other viral diseases: Secondary | ICD-10-CM | POA: Diagnosis not present

## 2019-08-26 DIAGNOSIS — M545 Low back pain: Secondary | ICD-10-CM | POA: Diagnosis not present

## 2019-09-08 ENCOUNTER — Ambulatory Visit (INDEPENDENT_AMBULATORY_CARE_PROVIDER_SITE_OTHER): Payer: Medicare Other | Admitting: Licensed Clinical Social Worker

## 2019-09-08 DIAGNOSIS — F4310 Post-traumatic stress disorder, unspecified: Secondary | ICD-10-CM

## 2019-09-08 DIAGNOSIS — F411 Generalized anxiety disorder: Secondary | ICD-10-CM | POA: Diagnosis not present

## 2019-09-08 DIAGNOSIS — F331 Major depressive disorder, recurrent, moderate: Secondary | ICD-10-CM | POA: Diagnosis not present

## 2019-09-08 DIAGNOSIS — F4329 Adjustment disorder with other symptoms: Secondary | ICD-10-CM | POA: Diagnosis not present

## 2019-09-08 DIAGNOSIS — G894 Chronic pain syndrome: Secondary | ICD-10-CM

## 2019-09-08 DIAGNOSIS — F4381 Prolonged grief disorder: Secondary | ICD-10-CM

## 2019-09-08 DIAGNOSIS — F4321 Adjustment disorder with depressed mood: Secondary | ICD-10-CM
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
## 2019-09-12 ENCOUNTER — Ambulatory Visit: Payer: Medicare Other | Admitting: Family Medicine

## 2019-09-12 NOTE — Progress Notes (Deleted)
Established Patient Office Visit  Subjective:  Patient ID: Ashley Gomez, female    DOB: 1971-02-06  Age: 49 y.o. MRN: [id number]  CC: No chief complaint on file.   HPI [name] presents for   Follow-up migraine headaches.  She is currently on Aimovig for prophylaxis.  Past Medical History:  Diagnosis Date  . Anxiety   . Arthritis   . CVA (cerebral infarction)    Old CVA on MRI brain  . Depression   . DVT (deep venous thrombosis) ([place]) 62/26/3335   L basilic vein   . Facet hypertrophy of lumbar region    MRI 2007  . Fibromyalgia   . GERD (gastroesophageal reflux disease)   . Hypertension    no meds now, lost 50lbs  . Migraines   . MS (multiple sclerosis) ([place])   . Neuromuscular disorder ([place])   . Obesity   . PTSD (post-traumatic stress disorder)   . Seizures ([place]) 2011   no seizure since onset  . Sleep apnea    diagnosed 20 ago, lost wt no CPAP now  . Smoking   . Stroke [facility])    doesn't know when    Past Surgical History:  Procedure Laterality Date  . CHOLECYSTECTOMY  10/2017  . CHONDROPLASTY Left 12/30/2014   Procedure: CHONDROPLASTY;  Surgeon: Leandrew Koyanagi, MD;  Location: [place];  Service: Orthopedics;  Laterality: Left;  . KNEE ARTHROSCOPY Left 12/30/2014   Procedure: LEFT KNEE ARTHROSCOPY WITH   CHONDROPLASTY;  Surgeon: Leandrew Koyanagi, MD;  Location: [place];  Service: Orthopedics;  Laterality: Left;  . PARTIAL KNEE ARTHROPLASTY Left 08/03/2016   Procedure: LEFT UNICOMPARTMENTAL KNEE ARTHROPLASTY;  Surgeon: Leandrew Koyanagi, MD;  Location: [place];  Service: Orthopedics;  Laterality: Left;  . TONSILLECTOMY    . TUBAL LIGATION  1992    Family History  Problem Relation Age of Onset  . Cancer Mother 8       melanoma  . Hypertension Sister   . Heart disease Sister        valve disease  . Depression Sister   . Diabetes Sister   . Sjogren's syndrome Sister     Social History   Socioeconomic History  . Marital  status: Single    Spouse name: Not on file  . Number of children: Not on file  . Years of education: Not on file  . Highest education level: Not on file  Occupational History  . Not on file  Tobacco Use  . Smoking status: Current Every Day Smoker    Packs/day: 1.00    Years: 25.00    Pack years: 25.00    Types: Cigarettes  . Smokeless tobacco: Never Used  . Tobacco comment: advised to d/c by 3 cigs per week.  Substance and Sexual Activity  . Alcohol use: No    Alcohol/week: 0.0 standard drinks    Comment: occasional  . Drug use: No  . Sexual activity: Yes    Birth control/protection: None, Post-menopausal  Other Topics Concern  . Not on file  Social History Narrative  . Not on file   Social Determinants of Health   Financial Resource Strain:   . Difficulty of Paying Living Expenses:   Food Insecurity:   . Worried About Charity fundraiser in the Last Year:   . Arboriculturist in the Last Year:   Transportation Needs:   . Film/video editor (Medical):   Marland Kitchen Lack  of Transportation (Non-Medical):   Physical Activity:   . Days of Exercise per Week:   . Minutes of Exercise per Session:   Stress:   . Feeling of Stress :   Social Connections:   . Frequency of Communication with Friends and Family:   . Frequency of Social Gatherings with Friends and Family:   . Attends Religious Services:   . Active Member of Clubs or Organizations:   . Attends Archivist Meetings:   Marland Kitchen Marital Status:   Intimate Partner Violence:   . Fear of Current or Ex-Partner:   . Emotionally Abused:   Marland Kitchen Physically Abused:   . Sexually Abused:     Outpatient Medications Prior to Visit  Medication Sig Dispense Refill  . acetaminophen (TYLENOL) 500 MG tablet Take 1,000 mg by mouth every 6 (six) hours as needed for mild pain or headache.    . AMBULATORY NON FORMULARY MEDICATION APPLY 1 PUMP (0.5ML) TO EACH SHOULDER, INNER WRIST OR THIGH TWICE A DAY (MAX 4 PUMPS PER DAY)    . ammonium  lactate (LAC-HYDRIN) 12 % lotion See admin instructions.    Marland Kitchen azelastine (ASTELIN) 0.1 % nasal spray Place 2 sprays into both nostrils 2 (two) times daily. Use in each nostril as directed 30 mL 12  . busPIRone (BUSPAR) 7.5 MG tablet TAKE 1 TABLET BY MOUTH 2 TIMES DAILY AS NEEDED. 180 tablet 0  . celecoxib (CELEBREX) 200 MG capsule Take 2 capsules by mouth as needed for pain.  Needs appointment 180 capsule 0  . CHANTIX 1 MG tablet TAKE 1 TABLET BY MOUTH TWICE A DAY 180 tablet 1  . clonazePAM (KLONOPIN) 1 MG tablet TAKE 1 TABLET TWICE A DAY AS NEEDED ANXIETY MUST LAST 30 DAYS 45 tablet 0  . cyclobenzaprine (FLEXERIL) 10 MG tablet Take 10 mg by mouth 2 (two) times daily as needed.    . diclofenac sodium (VOLTAREN) 1 % GEL See admin instructions.    . DULoxetine (CYMBALTA) 30 MG capsule TAKE 1 CAPSULE BY MOUTH DAILY. THIS IS IN ADDITION TO 60MG TAKING DAILY. TOTAL DOSE BE 90 MG 30 capsule 0  . DULoxetine (CYMBALTA) 60 MG capsule TAKE 1 CAPSULE BY MOUTH EVERY DAY 30 capsule 0  . Erenumab-aooe (AIMOVIG) 140 MG/ML SOAJ Inject 140 mg as directed every 30 (thirty) days. 1 pen 3  . famotidine (PEPCID) 20 MG tablet Take 1 tablet (20 mg total) by mouth daily. 90 tablet 3  . HYDROcodone-acetaminophen (NORCO) 10-325 MG tablet Take 1 tablet by mouth every 6 (six) hours as needed for pain. for pain  0  . levocetirizine (XYZAL) 5 MG tablet Take 5 mg by mouth every evening.    . metoprolol tartrate (LOPRESSOR) 25 MG tablet TAKE 1/2 TABLET TWICE A DAY 30 tablet 0  . NARCAN 4 MG/0.1ML LIQD nasal spray kit USE AS DIRECTED    . omeprazole (PRILOSEC) 40 MG capsule TAKE 1 CAPSULE BY MOUTH EVERY DAY 90 capsule 3  . ondansetron (ZOFRAN) 4 MG tablet Take 1 tablet (4 mg total) by mouth every 8 (eight) hours as needed for nausea or vomiting. 30 tablet 1  . rizatriptan (MAXALT-MLT) 10 MG disintegrating tablet Take 1 tablet (10 mg total) by mouth daily as needed. May repeat in 2 hours if needed 10 tablet 4  . Triamcinolone  Acetonide (TRIAMCINOLONE 0.1 % CREAM : EUCERIN) CREA Apply 1 application topically at bedtime. 1 Jar. 1 each 1   No facility-administered medications prior to visit.    Allergies  Allergen Reactions  . Ace Inhibitors     REACTION: cough  . Atenolol Other (See Comments)    Bradycardia  . Celexa [Citalopram Hydrobromide] Other (See Comments)    Dizziness Tolerated Lexapro   . Codeine Nausea Only  . Nitroglycerin Other (See Comments)    Drop in BP  . Phenytoin Swelling  . Sertraline Other (See Comments)    unknown  . Sumatriptan Nausea Only  . Tizanidine Other (See Comments)    Keep her awake.   . Topamax [Topiramate] Other (See Comments)    She reports that this makes her forgetful and causes her to studder  . Triamcinolone     Steroid flare after joint injection, use other steroid for injections    ROS Review of Systems    Objective:    Physical Exam  LMP 12/28/2014  Wt Readings from Last 3 Encounters:  08/04/19 291 lb (132 kg)  05/05/19 288 lb (130.6 kg)  04/04/19 281 lb (127.5 kg)     Health Maintenance Due  Topic Date Due  . COVID-19 Vaccine (1) Never done  . COLONOSCOPY  Never done    There are no preventive care reminders to display for this patient.  Lab Results  Component Value Date   TSH 1.10 08/04/2019   Lab Results  Component Value Date   WBC 7.9 08/04/2019   HGB 12.2 08/04/2019   HCT 38.1 08/04/2019   MCV 79.9 (L) 08/04/2019   PLT 298 08/04/2019   Lab Results  Component Value Date   NA 138 08/04/2019   K 4.9 08/04/2019   CO2 30 08/04/2019   GLUCOSE 112 (H) 08/04/2019   BUN 23 08/04/2019   CREATININE 0.78 08/04/2019   BILITOT 0.3 08/04/2019   ALKPHOS 98 11/29/2016   AST 20 08/04/2019   ALT 16 08/04/2019   PROT 6.4 08/04/2019   ALBUMIN 3.6 11/29/2016   CALCIUM 8.9 08/04/2019   ANIONGAP 11 08/04/2016   Lab Results  Component Value Date   CHOL 163 08/04/2019   Lab Results  Component Value Date   HDL 42 (L) 08/04/2019    Lab Results  Component Value Date   LDLCALC 98 08/04/2019   Lab Results  Component Value Date   TRIG 133 08/04/2019   Lab Results  Component Value Date   CHOLHDL 3.9 08/04/2019   Lab Results  Component Value Date   HGBA1C 5.8 (H) 08/04/2019      Assessment & Plan:   Problem List Items Addressed This Visit    None      No orders of the defined types were placed in this encounter.   Follow-up: No follow-ups on file.    Beatrice Lecher, MD

## 2019-09-13 ENCOUNTER — Ambulatory Visit (INDEPENDENT_AMBULATORY_CARE_PROVIDER_SITE_OTHER): Payer: Medicare Other

## 2019-09-13 ENCOUNTER — Other Ambulatory Visit: Payer: Self-pay

## 2019-09-13 DIAGNOSIS — M75112 Incomplete rotator cuff tear or rupture of left shoulder, not specified as traumatic: Secondary | ICD-10-CM

## 2019-09-13 DIAGNOSIS — M75102 Unspecified rotator cuff tear or rupture of left shoulder, not specified as traumatic: Secondary | ICD-10-CM | POA: Diagnosis not present

## 2019-09-13 DIAGNOSIS — M25412 Effusion, left shoulder: Secondary | ICD-10-CM | POA: Diagnosis not present

## 2019-09-15 DIAGNOSIS — M25512 Pain in left shoulder: Secondary | ICD-10-CM

## 2019-09-15 DIAGNOSIS — G8929 Other chronic pain: Secondary | ICD-10-CM

## 2019-09-15 DIAGNOSIS — M75122 Complete rotator cuff tear or rupture of left shoulder, not specified as traumatic: Secondary | ICD-10-CM

## 2019-09-16 ENCOUNTER — Ambulatory Visit: Payer: Medicare Other | Admitting: Sports Medicine

## 2019-09-16 ENCOUNTER — Encounter: Payer: Self-pay | Admitting: Family Medicine

## 2019-09-16 ENCOUNTER — Telehealth (INDEPENDENT_AMBULATORY_CARE_PROVIDER_SITE_OTHER): Payer: Medicare Other | Admitting: Family Medicine

## 2019-09-16 DIAGNOSIS — M75122 Complete rotator cuff tear or rupture of left shoulder, not specified as traumatic: Secondary | ICD-10-CM | POA: Diagnosis not present

## 2019-09-16 DIAGNOSIS — G43019 Migraine without aura, intractable, without status migrainosus: Secondary | ICD-10-CM

## 2019-09-16 DIAGNOSIS — F411 Generalized anxiety disorder: Secondary | ICD-10-CM

## 2019-09-16 MED ORDER — METOPROLOL SUCCINATE ER 25 MG PO TB24: 25.0000 mg | ORAL_TABLET | Freq: Every day | ORAL | 2 refills | Status: DC

## 2019-09-16 MED ORDER — CLONAZEPAM 1 MG PO TABS: ORAL_TABLET | ORAL | 0 refills | Status: DC

## 2019-09-16 NOTE — Assessment & Plan Note (Signed)
I did go ahead and refill her clonazepam today she uses 45 tabs per month.

## 2019-09-16 NOTE — Assessment & Plan Note (Addendum)
He says persistent and frequent migraines for the last 5 to 6 weeks.  We discussed at this point it may be worth getting an MRI just to rule out other possible causes.  She said she did respond short-term to a steroid and Toradol injection at pain management but then the headache started coming back again.  She is already on Aimovig as a controller.  Again does report increased rest levels which certainly could be contributing and she is actually been using her allergy medication regularly.  I think [place] our MRI is not unreasonable at this point.  Also see if we might have some free samples of you Ubrevly which we could at least try.  We will also restart her metoprolol.  It actually could have been helping her headaches.  We will go ahead and switch her to metoprolol succinate so that way she can truly take it once a day and actually get better 24-hour coverage.

## 2019-09-16 NOTE — Progress Notes (Signed)
Virtual Visit via Video Note  I connected with Ashley Gomez on 09/16/19 at  1:20 PM EDT by a video enabled telemedicine application and verified that I am speaking with the correct person using two identifiers.   I discussed the limitations of evaluation and management by telemedicine and the availability of in person appointments. The patient expressed understanding and agreed to proceed.  Subjective:    CC:   HPI: Off/on x 5-6 weeks she thought that it was her sinuses. When she was seen by pain management they gave her Toradol and some other injection for the migraine that was to help her with the migraine. She stated that the injection did help some. Wonders if could stress related. Her daughter-in-law passed away in 2022/09/04 and son was in bad wreck and fractured his cervical spine in April.  She stated that the pain feels like its frontal and on top of her head. She also feels like its in her cervical spine. 2 days after she gives her Aimovig shot she feels worse.  She did report that she actually ran out of her metoprolol a couple of months ago which was prescribed by cardiology she was actually taking 1 tab of 25 mg short-acting daily.  She has been taking 24 hour Claritin, sudafed,tylenol and Maxalt and non of these have helped. She took her Amovig on the 25th or 26th of April    Past medical history, Surgical history, Family history not pertinant except as noted below, Social history, Allergies, and medications have been entered into the medical record, reviewed, and corrections made.   Review of Systems: No fevers, chills, night sweats, weight loss, chest pain, or shortness of breath.   Objective:    General: Speaking clearly in complete sentences without any shortness of breath.  Alert and oriented x3.  Normal judgment. No apparent acute distress.    Impression and Recommendations:    Anxiety state I did go ahead and refill her clonazepam today she uses 45 tabs per  month.  Migraine without aura He says persistent and frequent migraines for the last 5 to 6 weeks.  We discussed at this point it may be worth getting an MRI just to rule out other possible causes.  She said she did respond short-term to a steroid and Toradol injection at pain management but then the headache started coming back again.  She is already on Aimovig as a controller.  Again does report increased rest levels which certainly could be contributing and she is actually been using her allergy medication regularly.  I think [place] our MRI is not unreasonable at this point.  Also see if we might have some free samples of you Ubrevly which we could at least try.  We will also restart her metoprolol.  It actually could have been helping her headaches.  We will go ahead and switch her to metoprolol succinate so that way she can truly take it once a day and actually get better 24-hour coverage.  Rotator cuff tear, left She did let me know that she was recently diagnosed with left rotator cuff tear with Dr. Dianah Field and he is referred her for surgical consultation and treatment.      Time spent in encounter 22 minutes  I discussed the assessment and treatment plan with the patient. The patient was provided an opportunity to ask questions and all were answered. The patient agreed with the plan and demonstrated an understanding of the instructions.   The patient was advised  to call back or seek an in-person evaluation if the symptoms worsen or if the condition fails to improve as anticipated.   Beatrice Lecher, MD

## 2019-09-16 NOTE — Progress Notes (Signed)
Off/on x 5-6 weeks she thought that it was her sinuses. When she was seen by pain management they gave her Toradol and some other injection for the migraine that was to help her with the migraine. She stated that the injection did help some She stated that the pain feels like its frontal and on top of her head. She also feels like its in her cervical spine.   She has been taking 24 hour Claritin, sudafed,tylenol and Maxalt and non of these have helped. She took her Amovig on the 25th or 26th of April

## 2019-09-16 NOTE — Assessment & Plan Note (Signed)
She did let me know that she was recently diagnosed with left rotator cuff tear with Dr. Dianah Field and he is referred her for surgical consultation and treatment.

## 2019-09-22 ENCOUNTER — Encounter: Payer: Self-pay | Admitting: Family Medicine

## 2019-09-22 DIAGNOSIS — L570 Actinic keratosis: Secondary | ICD-10-CM

## 2019-09-22 NOTE — Telephone Encounter (Signed)
Cindy,   Can you look into PA for Aimovig?  Thanks!!!!

## 2019-09-23 DIAGNOSIS — M25512 Pain in left shoulder: Secondary | ICD-10-CM | POA: Diagnosis not present

## 2019-09-23 NOTE — Telephone Encounter (Signed)
Any update on PA?

## 2019-09-23 NOTE — Telephone Encounter (Signed)
Not had physical in over a year, but has been seen for other concerns and had recent blood work.   Do you need to complete clearance as PCP, or Dr T as referring provider?

## 2019-09-24 DIAGNOSIS — L281 Prurigo nodularis: Secondary | ICD-10-CM | POA: Diagnosis not present

## 2019-09-24 DIAGNOSIS — L209 Atopic dermatitis, unspecified: Secondary | ICD-10-CM | POA: Diagnosis not present

## 2019-09-24 DIAGNOSIS — L821 Other seborrheic keratosis: Secondary | ICD-10-CM | POA: Diagnosis not present

## 2019-09-24 NOTE — Telephone Encounter (Signed)
Will likely need preop physical with me.  Go ahead and shcedule her. Does she have a date for her surgery?

## 2019-09-25 DIAGNOSIS — Z79891 Long term (current) use of opiate analgesic: Secondary | ICD-10-CM | POA: Diagnosis not present

## 2019-09-25 DIAGNOSIS — Z79899 Other long term (current) drug therapy: Secondary | ICD-10-CM | POA: Diagnosis not present

## 2019-09-25 DIAGNOSIS — F17210 Nicotine dependence, cigarettes, uncomplicated: Secondary | ICD-10-CM | POA: Diagnosis not present

## 2019-09-25 DIAGNOSIS — M545 Low back pain: Secondary | ICD-10-CM | POA: Diagnosis not present

## 2019-09-25 DIAGNOSIS — G8929 Other chronic pain: Secondary | ICD-10-CM | POA: Diagnosis not present

## 2019-09-26 ENCOUNTER — Encounter (HOSPITAL_COMMUNITY): Payer: Self-pay | Admitting: Psychiatry

## 2019-09-26 ENCOUNTER — Telehealth (INDEPENDENT_AMBULATORY_CARE_PROVIDER_SITE_OTHER): Payer: Medicare Other | Admitting: Psychiatry

## 2019-09-26 DIAGNOSIS — F411 Generalized anxiety disorder: Secondary | ICD-10-CM

## 2019-09-26 DIAGNOSIS — F331 Major depressive disorder, recurrent, moderate: Secondary | ICD-10-CM

## 2019-09-26 DIAGNOSIS — G894 Chronic pain syndrome: Secondary | ICD-10-CM | POA: Diagnosis not present

## 2019-09-26 DIAGNOSIS — F4329 Adjustment disorder with other symptoms: Secondary | ICD-10-CM | POA: Diagnosis not present

## 2019-09-26 DIAGNOSIS — F4321 Adjustment disorder with depressed mood: Secondary | ICD-10-CM

## 2019-09-26 MED ORDER — DULOXETINE HCL 60 MG PO CPEP: ORAL_CAPSULE | ORAL | 1 refills | Status: DC

## 2019-09-26 MED ORDER — DULOXETINE HCL 30 MG PO CPEP: ORAL_CAPSULE | ORAL | 0 refills | Status: DC

## 2019-09-26 MED ORDER — BUSPIRONE HCL 7.5 MG PO TABS: ORAL_TABLET | ORAL | 0 refills | Status: DC
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
## 2019-09-27 ENCOUNTER — Encounter: Payer: Self-pay | Admitting: Family Medicine

## 2019-09-27 DIAGNOSIS — Z886 Allergy status to analgesic agent status: Secondary | ICD-10-CM | POA: Diagnosis not present

## 2019-09-27 DIAGNOSIS — R05 Cough: Secondary | ICD-10-CM | POA: Diagnosis not present

## 2019-09-27 DIAGNOSIS — R1012 Left upper quadrant pain: Secondary | ICD-10-CM | POA: Diagnosis not present

## 2019-09-27 DIAGNOSIS — I871 Compression of vein: Secondary | ICD-10-CM | POA: Diagnosis not present

## 2019-09-27 DIAGNOSIS — Z20822 Contact with and (suspected) exposure to covid-19: Secondary | ICD-10-CM | POA: Diagnosis not present

## 2019-09-27 DIAGNOSIS — D735 Infarction of spleen: Secondary | ICD-10-CM | POA: Insufficient documentation

## 2019-09-27 DIAGNOSIS — K760 Fatty (change of) liver, not elsewhere classified: Secondary | ICD-10-CM | POA: Diagnosis not present

## 2019-09-27 DIAGNOSIS — R0789 Other chest pain: Secondary | ICD-10-CM | POA: Diagnosis not present

## 2019-09-27 DIAGNOSIS — Z888 Allergy status to other drugs, medicaments and biological substances status: Secondary | ICD-10-CM | POA: Diagnosis not present

## 2019-09-27 DIAGNOSIS — R9431 Abnormal electrocardiogram [ECG] [EKG]: Secondary | ICD-10-CM | POA: Diagnosis not present

## 2019-09-27 DIAGNOSIS — G894 Chronic pain syndrome: Secondary | ICD-10-CM | POA: Diagnosis not present

## 2019-09-27 DIAGNOSIS — I10 Essential (primary) hypertension: Secondary | ICD-10-CM | POA: Diagnosis not present

## 2019-09-27 DIAGNOSIS — F17210 Nicotine dependence, cigarettes, uncomplicated: Secondary | ICD-10-CM | POA: Diagnosis not present

## 2019-09-27 DIAGNOSIS — Z86718 Personal history of other venous thrombosis and embolism: Secondary | ICD-10-CM | POA: Diagnosis not present

## 2019-09-27 DIAGNOSIS — K5730 Diverticulosis of large intestine without perforation or abscess without bleeding: Secondary | ICD-10-CM | POA: Diagnosis not present

## 2019-09-27 DIAGNOSIS — F17200 Nicotine dependence, unspecified, uncomplicated: Secondary | ICD-10-CM | POA: Insufficient documentation

## 2019-09-27 DIAGNOSIS — G35 Multiple sclerosis: Secondary | ICD-10-CM | POA: Diagnosis not present

## 2019-09-27 DIAGNOSIS — D3502 Benign neoplasm of left adrenal gland: Secondary | ICD-10-CM | POA: Diagnosis not present

## 2019-09-27 DIAGNOSIS — Z79899 Other long term (current) drug therapy: Secondary | ICD-10-CM | POA: Diagnosis not present

## 2019-09-27 DIAGNOSIS — R1084 Generalized abdominal pain: Secondary | ICD-10-CM | POA: Diagnosis not present

## 2019-09-27 DIAGNOSIS — Z885 Allergy status to narcotic agent status: Secondary | ICD-10-CM | POA: Diagnosis not present

## 2019-09-27 DIAGNOSIS — I517 Cardiomegaly: Secondary | ICD-10-CM | POA: Diagnosis not present

## 2019-09-27 DIAGNOSIS — K449 Diaphragmatic hernia without obstruction or gangrene: Secondary | ICD-10-CM | POA: Diagnosis not present

## 2019-09-27 DIAGNOSIS — Z85828 Personal history of other malignant neoplasm of skin: Secondary | ICD-10-CM | POA: Diagnosis not present

## 2019-09-27 DIAGNOSIS — R110 Nausea: Secondary | ICD-10-CM | POA: Diagnosis not present

## 2019-09-29 ENCOUNTER — Ambulatory Visit: Payer: Medicare Other | Admitting: Family Medicine

## 2019-09-29 MED ORDER — SODIUM CHLORIDE 0.9 % IV SOLN
10.00 | INTRAVENOUS | Status: DC
Start: ? — End: 2019-09-29

## 2019-09-29 MED ORDER — GENERIC EXTERNAL MEDICATION
Status: DC
Start: ? — End: 2019-09-29

## 2019-09-29 NOTE — Telephone Encounter (Signed)
Patient advised and rescheduled.  

## 2019-10-02 ENCOUNTER — Other Ambulatory Visit: Payer: Self-pay

## 2019-10-02 ENCOUNTER — Encounter: Payer: Self-pay | Admitting: Family Medicine

## 2019-10-02 ENCOUNTER — Ambulatory Visit (INDEPENDENT_AMBULATORY_CARE_PROVIDER_SITE_OTHER): Payer: Medicare Other | Admitting: Family Medicine

## 2019-10-02 VITALS — BP 124/69 | HR 81 | Ht 66.0 in | Wt 290.0 lb

## 2019-10-02 DIAGNOSIS — D735 Infarction of spleen: Secondary | ICD-10-CM

## 2019-10-02 DIAGNOSIS — I8290 Acute embolism and thrombosis of unspecified vein: Secondary | ICD-10-CM | POA: Diagnosis not present

## 2019-10-02 DIAGNOSIS — R635 Abnormal weight gain: Secondary | ICD-10-CM | POA: Diagnosis not present

## 2019-10-02 LAB — CBC WITH DIFFERENTIAL/PLATELET
Absolute Monocytes: 689 cells/uL (ref 200–950)
Basophils Absolute: 41 cells/uL (ref 0–200)
Basophils Relative: 0.5 %
Eosinophils Absolute: 227 cells/uL (ref 15–500)
Eosinophils Relative: 2.8 %
HCT: 38.2 % (ref 35.0–45.0)
Hemoglobin: 12.1 g/dL (ref 11.7–15.5)
Lymphs Abs: 2130 cells/uL (ref 850–3900)
MCH: 25.7 pg — ABNORMAL LOW (ref 27.0–33.0)
MCHC: 31.7 g/dL — ABNORMAL LOW (ref 32.0–36.0)
MCV: 81.3 fL (ref 80.0–100.0)
MPV: 10.5 fL (ref 7.5–12.5)
Monocytes Relative: 8.5 %
Neutro Abs: 5014 cells/uL (ref 1500–7800)
Neutrophils Relative %: 61.9 %
Platelets: 363 10*3/uL (ref 140–400)
RBC: 4.7 10*6/uL (ref 3.80–5.10)
RDW: 14.4 % (ref 11.0–15.0)
Total Lymphocyte: 26.3 %
WBC: 8.1 10*3/uL (ref 3.8–10.8)
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
## 2019-10-03 ENCOUNTER — Encounter (HOSPITAL_BASED_OUTPATIENT_CLINIC_OR_DEPARTMENT_OTHER): Payer: Self-pay

## 2019-10-03 ENCOUNTER — Emergency Department (HOSPITAL_BASED_OUTPATIENT_CLINIC_OR_DEPARTMENT_OTHER)
Admission: EM | Admit: 2019-10-03 | Discharge: 2019-10-04 | Disposition: A | Discharge status: Home / Self Care | Payer: Medicare Other | Attending: Emergency Medicine | Admitting: Emergency Medicine

## 2019-10-03 ENCOUNTER — Other Ambulatory Visit: Payer: Self-pay

## 2019-10-03 ENCOUNTER — Encounter: Payer: Self-pay | Admitting: Family Medicine

## 2019-10-03 ENCOUNTER — Ambulatory Visit (HOSPITAL_BASED_OUTPATIENT_CLINIC_OR_DEPARTMENT_OTHER)
Admission: RE | Admit: 2019-10-03 | Discharge: 2019-10-03 | Disposition: A | Discharge status: Home / Self Care | Payer: Medicare Other | Source: Ambulatory Visit | Attending: Family Medicine | Admitting: Family Medicine

## 2019-10-03 ENCOUNTER — Encounter (HOSPITAL_BASED_OUTPATIENT_CLINIC_OR_DEPARTMENT_OTHER): Payer: Self-pay | Admitting: *Deleted

## 2019-10-03 DIAGNOSIS — I10 Essential (primary) hypertension: Secondary | ICD-10-CM | POA: Diagnosis not present

## 2019-10-03 DIAGNOSIS — F17210 Nicotine dependence, cigarettes, uncomplicated: Secondary | ICD-10-CM | POA: Diagnosis not present

## 2019-10-03 DIAGNOSIS — R1012 Left upper quadrant pain: Secondary | ICD-10-CM | POA: Insufficient documentation

## 2019-10-03 DIAGNOSIS — Z7901 Long term (current) use of anticoagulants: Secondary | ICD-10-CM | POA: Diagnosis not present

## 2019-10-03 DIAGNOSIS — Z79899 Other long term (current) drug therapy: Secondary | ICD-10-CM | POA: Insufficient documentation

## 2019-10-03 DIAGNOSIS — D735 Infarction of spleen: Secondary | ICD-10-CM | POA: Insufficient documentation

## 2019-10-03 DIAGNOSIS — R109 Unspecified abdominal pain: Secondary | ICD-10-CM | POA: Diagnosis present

## 2019-10-03 LAB — COMPLETE METABOLIC PANEL WITH GFR
AG Ratio: 1.4 (calc) (ref 1.0–2.5)
ALT: 10 U/L (ref 6–29)
AST: 14 U/L (ref 10–35)
Albumin: 3.4 g/dL — ABNORMAL LOW (ref 3.6–5.1)
Alkaline phosphatase (APISO): 96 U/L (ref 37–153)
BUN: 12 mg/dL (ref 7–25)
CO2: 32 mmol/L (ref 20–32)
Calcium: 8.6 mg/dL (ref 8.6–10.4)
Chloride: 104 mmol/L (ref 98–110)
Creat: 0.76 mg/dL (ref 0.50–1.05)
GFR, Est African American: 106 mL/min/{1.73_m2} (ref >=60)
GFR, Est Non African American: 91 mL/min/{1.73_m2} (ref >=60)
Globulin: 2.5 g/dL (calc) (ref 1.9–3.7)
Glucose, Bld: 125 mg/dL — ABNORMAL HIGH (ref 65–99)
Potassium: 4 mmol/L (ref 3.5–5.3)
Sodium: 141 mmol/L (ref 135–146)
Total Bilirubin: 0.2 mg/dL (ref 0.2–1.2)
Total Protein: 5.9 g/dL — ABNORMAL LOW (ref 6.1–8.1)

## 2019-10-03 MED ORDER — OXYCODONE HCL 5 MG PO TABS: 5.0000 mg | ORAL_TABLET | Freq: Once | ORAL | Status: DC

## 2019-10-03 MED ORDER — IOHEXOL 350 MG/ML SOLN
100.0000 mL | Freq: Once | INTRAVENOUS | Status: AC | PRN
  Administered 2019-10-03: 100 mL via INTRAVENOUS

## 2019-10-03 MED ORDER — ACETAMINOPHEN 500 MG PO TABS
1000.0000 mg | ORAL_TABLET | Freq: Once | ORAL | Status: AC
  Administered 2019-10-03: 1000 mg via ORAL
  Filled 2019-10-03: qty 2

## 2019-10-03 NOTE — Addendum Note (Signed)
Addended by: Narda Rutherford on: 10/03/2019 03:10 PM   Modules accepted: Orders

## 2019-10-03 NOTE — Telephone Encounter (Signed)
Called patient and advised of order on VM. This must be done at [place] due to having Medicare. Carolynn called patient to schedule. I have called [facility] and added a CMP on STAT

## 2019-10-03 NOTE — ED Triage Notes (Signed)
Pt reports that she was at [facility] last week and was dx with a blood clot in her spleen. States that she had repeat blood work and CT scan today that showed a second clot. Pt has been on xarelto x 7 days. Pt obviously SOB.

## 2019-10-03 NOTE — Telephone Encounter (Signed)
Ashley Gomez advised to go to the lab for stat labs. Also she has been scheduled for a CT at [place] at 9 pm. She is aware to check in at the ED.

## 2019-10-03 NOTE — ED Provider Notes (Signed)
[facility] EMERGENCY DEPARTMENT Provider Note  CSN: [id number] Arrival date & time: 10/03/19 2203  Chief Complaint(s) Abdominal Pain  HPI Ashley Gomez is a 49 y.o. female with known splenic infarct diagnosed on May 15 who was sent to the emergency department by her PCP for persistent pain.  Patient had a repeat CT angio of the abdomen which showed stable infarct and revealed IMA occlusion with good collaterals.  The CT tech spoke to PCP who reportedly requested patient be admitted for vascular surgery evaluation and management.  I attempted to contact the PCP was unsuccessful.  Patient reports that the pain has been ongoing and unchanged since the initial onset.  Worse with movement, palpation, deep breathing.  Patient has been taking her home pain medicine which provides some mild relief.  There is no associated vomiting.  No fevers no chills.  Patient is on Xarelto.  No other physical complaints.  HPI  Past Medical History Past Medical History:  Diagnosis Date  . Anxiety   . Arthritis   . CVA (cerebral infarction)    Old CVA on MRI brain  . Depression   . DVT (deep venous thrombosis) ([place]) 46/28/6381   L basilic vein   . Facet hypertrophy of lumbar region    MRI 2007  . Fibromyalgia   . GERD (gastroesophageal reflux disease)   . Hypertension    no meds now, lost 50lbs  . Migraines   . MS (multiple sclerosis) ([place])   . Neuromuscular disorder ([place])   . Obesity   . PTSD (post-traumatic stress disorder)   . Seizures ([place]) 2011   no seizure since onset  . Sleep apnea    diagnosed 20 ago, lost wt no CPAP now  . Smoking   . Stroke [facility])    doesn't know when   Patient Active Problem List   Diagnosis Date Noted  . Squamous cell skin cancer 08/04/2019  . IFG (impaired fasting glucose) 08/04/2019  . Abnormal weight gain 11/27/2018  . Abdominal pain, chronic, epigastric 11/27/2018  . BMI 45.0-49.9, adult ([place]) 10/04/2018  . Primary osteoarthritis of left hip  08/05/2018  . Low calcium levels 07/26/2018  . Family history of celiac disease 05/29/2018  . Precordial chest pain 03/06/2018  . Dyslipidemia 03/06/2018  . Adrenal mass ([place]) 01/23/2018  . GERD (gastroesophageal reflux disease) 09/20/2017  . Heavy tobacco smoker 09/20/2017  . Adenoma of left adrenal gland 09/11/2017  . Diverticulosis of colon 09/11/2017  . Left nephrolithiasis 09/11/2017  . Hepatic steatosis 09/07/2017  . Status post left partial knee replacement 08/03/2016  . Rotator cuff tear, left 07/06/2016  . Trochanteric bursitis of left hip 03/31/2015  . Dependent personality disorder ([place]) 12/03/2014  . Chronic pain syndrome 12/03/2014  . Myofascial pain 11/09/2014  . Dysthymic disorder 10/15/2014  . PTSD (post-traumatic stress disorder) 10/15/2014  . Acute pain of left knee 08/27/2014  . Primary osteoarthritis of both knees 12/21/2011  . B12 DEFICIENCY 06/21/2010  . Vitamin D deficiency 06/21/2010  . NECK PAIN, CHRONIC 06/21/2010  . VARICOSE VEINS, LOWER EXTREMITIES 05/17/2010  . Post-menopausal 05/17/2010  . NUMBNESS, ARM 04/04/2010  . History of DVT (deep vein thrombosis) 01/13/2010  . SEIZURE DISORDER 01/13/2010  . Depression, major, recurrent, moderate ([place]) 05/16/2009  . Morbid obesity ([place]) 03/09/2009  . Anxiety state 03/09/2009  . CIGARETTE SMOKER 03/09/2009  . MULTIPLE SCLEROSIS, RELAPSING/REMITTING 03/09/2009  . Migraine without aura 03/09/2009  . ESSENTIAL HYPERTENSION, BENIGN 03/09/2009  . Lumbar degenerative disc disease 03/09/2009  Home Medication(s) Prior to Admission medications   Medication Sig Start Date End Date Taking? Authorizing Provider  acetaminophen (TYLENOL) 500 MG tablet Take 1,000 mg by mouth every 6 (six) hours as needed for mild pain or headache.    [provider]  AMBULATORY NON FORMULARY MEDICATION testosterone 07/26/19   [provider]  ammonium lactate (LAC-HYDRIN) 12 % lotion See admin instructions. 07/23/19    [provider]  azelastine (ASTELIN) 0.1 % nasal spray Place 2 sprays into both nostrils 2 (two) times daily. Use in each nostril as directed 07/10/18   Gregor Hams, MD  busPIRone (BUSPAR) 7.5 MG tablet TAKE 1 TABLET BY MOUTH 2 TIMES DAILY AS NEEDED. 09/26/19   Merian Capron, MD  celecoxib (CELEBREX) 200 MG capsule Take 2 capsules by mouth as needed for pain.  Needs appointment 03/25/19   Silverio Decamp, MD  clonazePAM (KLONOPIN) 1 MG tablet TAKE 1 TABLET TWICE A DAY AS NEEDED ANXIETY MUST LAST 30 DAYS 09/16/19   Hali Marry, MD  cyclobenzaprine (FLEXERIL) 10 MG tablet Take 10 mg by mouth 2 (two) times daily as needed. 07/28/19   [provider]  diclofenac sodium (VOLTAREN) 1 % GEL See admin instructions. 06/20/18   [provider]  DULoxetine (CYMBALTA) 30 MG capsule Once a day with 75m. 09/26/19   AMerian Capron MD  DULoxetine (CYMBALTA) 60 MG capsule TAKE 1 CAPSULE BY MOUTH EVERY DAY 09/26/19   AMerian Capron MD  Erenumab-aooe (AIMOVIG) 140 MG/ML SOAJ Inject 140 mg as directed every 30 (thirty) days. 08/04/19   MHali Marry MD  famotidine (PEPCID) 20 MG tablet Take 1 tablet (20 mg total) by mouth daily. 08/04/19   MHali Marry MD  HYDROcodone-acetaminophen (NORCO) 10-325 MG tablet Take 1 tablet by mouth every 6 (six) hours as needed for pain. for pain 03/25/18   [provider]  levocetirizine (XYZAL) 5 MG tablet Take 5 mg by mouth every evening.    [provider]  metoprolol succinate (TOPROL-XL) 25 MG 24 hr tablet Take 1 tablet (25 mg total) by mouth daily. 09/16/19   MHali Marry MD  NARCAN 4 MG/0.1ML LIQD nasal spray kit USE AS DIRECTED 02/07/19   [provider]  omeprazole (PRILOSEC) 40 MG capsule TAKE 1 CAPSULE BY MOUTH EVERY DAY 08/04/19   MHali Marry MD  ondansetron (ZOFRAN) 4 MG tablet Take 1 tablet (4 mg total) by mouth every 8 (eight) hours as needed for nausea or vomiting.  08/04/19   MHali Marry MD  rivaroxaban (XARELTO) 20 MG TABS tablet Take by mouth. 10/28/19   [provider]  rizatriptan (MAXALT-MLT) 10 MG disintegrating tablet Take 1 tablet (10 mg total) by mouth daily as needed. May repeat in 2 hours if needed 11/27/18   MHali Marry MD  Triamcinolone Acetonide (TRIAMCINOLONE 0.1 % CREAM : EUCERIN) CREA Apply 1 application topically at bedtime. 1 Jar. 05/29/18   MHali Marry MD  [place] 09/27/19 10/27/19  WAlfonso Patten DO  Past Surgical History Past Surgical History:  Procedure Laterality Date  . CHOLECYSTECTOMY  10/2017  . CHONDROPLASTY Left 12/30/2014   Procedure: CHONDROPLASTY;  Surgeon: Leandrew Koyanagi, MD;  Location: [facility];  Service: Orthopedics;  Laterality: Left;  . KNEE ARTHROSCOPY Left 12/30/2014   Procedure: LEFT KNEE ARTHROSCOPY WITH   CHONDROPLASTY;  Surgeon: Leandrew Koyanagi, MD;  Location: [facility];  Service: Orthopedics;  Laterality: Left;  . PARTIAL KNEE ARTHROPLASTY Left 08/03/2016   Procedure: LEFT UNICOMPARTMENTAL KNEE ARTHROPLASTY;  Surgeon: Leandrew Koyanagi, MD;  Location: [facility];  Service: Orthopedics;  Laterality: Left;  . TONSILLECTOMY    . TUBAL LIGATION  1992   Family History Family History  Problem Relation Age of Onset  . Cancer Mother 22       melanoma  . Hypertension Sister   . Heart disease Sister        valve disease  . Depression Sister   . Diabetes Sister   . Sjogren's syndrome Sister     Social History Social History   Tobacco Use  . Smoking status: Current Every Day Smoker    Packs/day: 1.00    Years: 25.00    Pack years: 25.00    Types: Cigarettes  . Smokeless tobacco: Never Used  . Tobacco comment: advised to d/c by 3 cigs per week.  Substance Use Topics  . Alcohol use: No    Alcohol/week: 0.0  standard drinks    Comment: occasional  . Drug use: No   Allergies Ace inhibitors, Atenolol, Celexa [citalopram hydrobromide], Codeine, Nitroglycerin, Phenytoin, Sertraline, Sumatriptan, Tizanidine, Topamax [topiramate], and Triamcinolone  Review of Systems Review of Systems All other systems are reviewed and are negative for acute change except as noted in the HPI  Physical Exam Vital Signs  I have reviewed the triage vital signs BP (!) 165/77   Pulse 87   Temp 98.2 F (36.8 C) (Oral)   Resp (!) 22   Ht '5\' 6"'  (1.676 m)   Wt 127 kg   LMP 12/28/2014   SpO2 98%   BMI 45.19 kg/m   Physical Exam Vitals reviewed.  Constitutional:      General: She is not in acute distress.    Appearance: She is well-developed. She is not diaphoretic.  HENT:     Head: Normocephalic and atraumatic.     Right Ear: External ear normal.     Left Ear: External ear normal.     Nose: Nose normal.  Eyes:     General: No scleral icterus.    Conjunctiva/sclera: Conjunctivae normal.  Neck:     Trachea: Phonation normal.  Cardiovascular:     Rate and Rhythm: Normal rate and regular rhythm.  Pulmonary:     Effort: Pulmonary effort is normal. No respiratory distress.     Breath sounds: No stridor.  Abdominal:     General: There is no distension.     Tenderness: There is abdominal tenderness in the left upper quadrant. There is rebound. There is no guarding.  Musculoskeletal:        General: Normal range of motion.     Cervical back: Normal range of motion.  Neurological:     Mental Status: She is alert and oriented to person, place, and time.  Psychiatric:        Behavior: Behavior normal.     ED Results and Treatments Labs (all labs ordered are listed, but only abnormal results are displayed) Labs Reviewed - No data  to display                                                                                                                       EKG  EKG Interpretation  Date/Time:      Ventricular Rate:    PR Interval:    QRS Duration:   QT Interval:    QTC Calculation:   R Axis:     Text Interpretation:        Radiology CT Angio Abd/Pel w/ and/or w/o  Addendum Date: 10/04/2019   ADDENDUM REPORT: 10/04/2019 00:59 ADDENDUM: Clinical discussion of the findings above in particular the splenic findings and atheromatous disease as well as the prevalence and clinical significance of IMA occlusion, were discussed via telephone on 10/04/2019 at 12:58 am to provider Addison Lank, with verbal acknowledgement. Electronically Signed   By: Lovena Le M.D.   On: 10/04/2019 00:59   Result Date: 10/04/2019 CLINICAL DATA:  Concern for splenic artery dissection, known splenic infarct with worsening pain EXAM: CTA ABDOMEN AND PELVIS WITHOUT AND WITH CONTRAST TECHNIQUE: Multidetector CT imaging of the abdomen and pelvis was performed using the standard protocol during bolus administration of intravenous contrast. Multiplanar reconstructed images and MIPs were obtained and reviewed to evaluate the vascular anatomy. CONTRAST:  173m OMNIPAQUE IOHEXOL 350 MG/ML SOLN COMPARISON:  Pelvic ultrasound 05/30/2016, renal ultrasound 12/02/2018 FINDINGS: VASCULAR Aorta: Mixed calcified and noncalcified atherosclerotic plaque throughout the abdominal aorta without aneurysm or ectasia. No hemodynamically significant stenosis. No evidence of aneurysm, dissection or vasculitis. Celiac: Patent without evidence of aneurysm, dissection, vasculitis or significant stenosis within the celiac trunk or splenic artery branches as visualized. SMA: Patent without evidence of aneurysm, dissection, vasculitis or significant stenosis. Renals: Both renal arteries are patent without evidence of aneurysm, dissection, vasculitis, fibromuscular dysplasia or significant stenosis. IMA: Severe stenosis versus occlusion of the IMA origin but with distal opacification possibly supplied by left colic collaterals. Proximal vessel otherwise  normally opacifies. Inflow: Atheromatous plaque within the inflow vessels predominantly within the common iliacs and internal iliac branches. No significant stenosis. No evidence of aneurysm, dissection or vasculitis. Proximal Outflow: Minimal plaque in the common femoral arteries. Proximal portions of the femoral profundus and superficial femoral arteries are unremarkable. Veins: Venous phase imaging demonstrates a small hypodense filling defect in the distal splenic vein adjacent the infarcted segment of splenic tissue. No other venous filling defects are identified in the abdomen or pelvis. Review of the MIP images confirms the above findings. NON-VASCULAR Lower chest: Punctate calcification in the right lung base likely calcified splenic granuloma. Normal heart size. No pericardial effusion. Hepatobiliary: No focal liver abnormality is seen. Patient is post cholecystectomy. No calcified intraductal gallstones. Pancreas: Unremarkable. No pancreatic ductal dilatation or surrounding inflammatory changes. Spleen: Hypoattenuation of the anterior spleen compatible with known splenic infarct. Adjacent venous filling defect in a small distal splenic vein branch, described above. No other concerning splenic abnormality. Adrenals/Urinary Tract: 2.4 cm nodule in the left adrenal gland with venous phase  enhancement likely corresponds to the patient's known left adrenal adenoma with significant relative washout (82%). No concerning right adrenal lesion. Kidneys enhance and excrete symmetrically. Symmetric renal size and location. No concerning renal masses. No urolithiasis or hydronephrosis. Urinary bladder is largely decompressed at the time of exam and therefore poorly evaluated by CT imaging. No gross bladder abnormality. Stomach/Bowel: Distal esophagus, stomach and duodenal sweep are unremarkable. No small bowel wall thickening or dilatation. No evidence of obstruction. A normal appendix is visualized. No colonic  dilatation or wall thickening. Scattered colonic diverticula without focal pericolonic inflammation to suggest diverticulitis. Lymphatic: No suspicious or enlarged lymph nodes in the included lymphatic chains. Reproductive: Anteverted uterus. No concerning adnexal lesions. 13 mm cyst in the left adnexa is likely within physiologic normal for patient this age. Other: No abdominopelvic free fluid or free gas. No bowel containing hernias. Musculoskeletal: No acute osseous abnormality or suspicious osseous lesion. Multilevel degenerative changes are present in the imaged portions of the spine. Additional degenerative changes in the hips and pelvis. IMPRESSION: VASCULAR 1. Miniscule filling defect in the distal splenic vein adjacent the infarcted region of anterior splenic tissue (10/20). 2. No acute splenic artery thrombosis, aneurysm or dissection. 3.  Aortic Atherosclerosis (ICD10-I70.0). 4. Severe ostial narrowing versus occlusion of the IMA origin. Distal opacification possibly supplied by back filling via collaterals. 5. No other significant vascular findings in the abdomen or pelvis. NON-VASCULAR 1. Anterior splenic infarct, as above. 2. 2.4 cm left adrenal adenoma. 3. Colonic diverticulosis without evidence of diverticulitis. 4. Calcified granuloma in the right lung base. 5. Post cholecystectomy. Electronically Signed: By: Lovena Le M.D. On: 10/03/2019 21:48    Pertinent labs & imaging results that were available during my care of the patient were reviewed by me and considered in my medical decision making (see chart for details).  Medications Ordered in ED Medications  oxyCODONE (Oxy IR/ROXICODONE) immediate release tablet 5 mg (has no administration in time range)  acetaminophen (TYLENOL) tablet 1,000 mg (1,000 mg Oral Given 10/03/19 2358)                                                                                                                                     Procedures Procedures  (including critical care time)  Medical Decision Making / ED Course I have reviewed the nursing notes for this encounter and the patient's prior records (if available in EHR or on provided paperwork).   Ashley Gomez was evaluated in Emergency Department on 10/04/2019 for the symptoms described in the history of present illness. She was evaluated in the context of the global COVID-19 pandemic, which necessitated consideration that the patient might be at risk for infection with the SARS-CoV-2 virus that causes COVID-19. Institutional protocols and algorithms that pertain to the evaluation of patients at risk for COVID-19 are in a state of rapid change based on information released by regulatory bodies including the CDC and  federal and state organizations. These policies and algorithms were followed during the patient's care in the ED.  Known splenic infarct without acute change on CTA. Severe IMA occlusion with good collaterals and no evidence of intestinal infarcts. CBC performed within the last 24 hours with stable hemoglobin. I attempted to contact Dr. Madilyn Fireman, the patient's PCP but was unsuccessful.  I discussed imaging with Dr. Marguerita Merles from radiology who reported that this is a typical finding and usually clinically insignificant given good collaterals.  I spoke with Dr. Donzetta Matters, from vascular surgery who also agreed.  There is no acute intervention required at this time that requires further work up or management.     Final Clinical Impression(s) / ED Diagnoses Final diagnoses:  Splenic infarct    The patient appears reasonably screened and/or stabilized for discharge and I doubt any other medical condition or other [facility] requiring further screening, evaluation, or treatment in the ED at this time prior to discharge. Safe for discharge with strict return precautions.  Disposition: Discharge  Condition: Good  I have discussed the results, Dx and Tx plan with  the patient/family who expressed understanding and agree(s) with the plan. Discharge instructions discussed at length. The patient/family was given strict return precautions who verbalized understanding of the instructions. No further questions at time of discharge.    ED Discharge Orders    None       Follow Up: Hali Marry, [address] [place] [address] [phone]  Schedule an appointment as soon as possible for a visit  As needed  Way[address]ta Sandy, [place] [address] [phone]  Schedule an appointment as soon as possible for a visit  As needed     This chart was dictated using voice recognition software.  Despite best efforts to proofread,  errors can occur which can change the documentation meaning.   Fatima Blank, MD 10/04/19 [phone]

## 2019-10-03 NOTE — Telephone Encounter (Signed)
Call patient and see if we can get her in for a stat CT.  If she feels like her pain is getting worse and I think we should repeat her scan just to make sure that it does not look like things are changing or progressing or getting worse.

## 2019-10-03 NOTE — ED Notes (Signed)
Attempted to call pt to room. Pt not in lobby. Pt called on her cell phone and stated she was in her car. Pt instructed to return to ED. Pt escorted to room at this time.

## 2019-10-04 ENCOUNTER — Encounter: Payer: Self-pay | Admitting: Family Medicine

## 2019-10-06 ENCOUNTER — Ambulatory Visit (HOSPITAL_COMMUNITY): Payer: Medicare Other | Admitting: Licensed Clinical Social Worker

## 2019-10-20 ENCOUNTER — Encounter: Payer: Self-pay | Admitting: Family Medicine

## 2019-10-21 DIAGNOSIS — Z79891 Long term (current) use of opiate analgesic: Secondary | ICD-10-CM | POA: Diagnosis not present

## 2019-10-21 DIAGNOSIS — M545 Low back pain: Secondary | ICD-10-CM | POA: Diagnosis not present

## 2019-10-21 DIAGNOSIS — G8929 Other chronic pain: Secondary | ICD-10-CM | POA: Diagnosis not present

## 2019-10-21 DIAGNOSIS — F17210 Nicotine dependence, cigarettes, uncomplicated: Secondary | ICD-10-CM | POA: Diagnosis not present

## 2019-10-22 ENCOUNTER — Other Ambulatory Visit: Payer: Self-pay | Admitting: Family Medicine

## 2019-10-22 DIAGNOSIS — F411 Generalized anxiety disorder: Secondary | ICD-10-CM

## 2019-10-30 ENCOUNTER — Ambulatory Visit: Payer: Medicare Other | Admitting: Family Medicine

## 2019-10-30 ENCOUNTER — Other Ambulatory Visit (HOSPITAL_COMMUNITY): Payer: Self-pay | Admitting: Psychiatry

## 2019-11-03 ENCOUNTER — Ambulatory Visit (HOSPITAL_COMMUNITY): Payer: Medicare Other | Admitting: Licensed Clinical Social Worker

## 2019-11-11 ENCOUNTER — Ambulatory Visit (INDEPENDENT_AMBULATORY_CARE_PROVIDER_SITE_OTHER): Payer: Medicare Other | Admitting: Licensed Clinical Social Worker

## 2019-11-11 DIAGNOSIS — G894 Chronic pain syndrome: Secondary | ICD-10-CM | POA: Diagnosis not present

## 2019-11-11 DIAGNOSIS — F4329 Adjustment disorder with other symptoms: Secondary | ICD-10-CM

## 2019-11-11 DIAGNOSIS — F331 Major depressive disorder, recurrent, moderate: Secondary | ICD-10-CM | POA: Diagnosis not present

## 2019-11-11 DIAGNOSIS — F411 Generalized anxiety disorder: Secondary | ICD-10-CM

## 2019-11-11 DIAGNOSIS — F4381 Prolonged grief disorder: Secondary | ICD-10-CM

## 2019-11-11 DIAGNOSIS — F4321 Adjustment disorder with depressed mood: Secondary | ICD-10-CM

## 2019-11-11 DIAGNOSIS — F4310 Post-traumatic stress disorder, unspecified: Secondary | ICD-10-CM

## 2019-11-11 NOTE — Progress Notes (Signed)
THERAPIST PROGRESS NOTE  Session Time: 2:03 PM to 3:00 PM  Participation Level: Active  Behavioral Response: CasualAlertAnxious and Dysphoric  Type of Therapy: Individual Therapy  Treatment Goals addressed: Decrease in depression, anxiety, stress management, coping, trauma, grief  Interventions: Solution Focused, Strength-based, Supportive, Reframing and Other: coping  Summary: Ashley Gomez is a 49 y.o. female who presents with a lot has happened and it is finally catching up with her. In past year skin cancer. Spleen ripped. Had a blood clot and rerouted. Why in pain, blood rerouted when breath the diaphragm would mash against spleen. Found out another blood clot and where pain coming from. It has been about 4-5 weeks and better. On a blood thinner, supposed to have surgery on shoulder for rotator cuff surgery. Called to schedule and nobody calling back has to wait six weeks due to blood thinner.Consulted a vascular specialist to see if blood disorder. Says he thinks he would be find with blood thinner. Hard to sleep with shoulder problem. Mark got halo off. Took him to hospital because says he doesn't want to live. Taking to hospital and angry and screaming. After giving him shots told therapist his symptoms not significant enough for him to stay. Ok to come home if patient takes care of medicine, ups his Prozac. Changed his halo to hard collar. Concerned put it on early and don't think he should have been discharged from the hospital. He is doing better since home. Neck injury lucky to be alive and off of pain medicine. Pain issues doesn't know why pain clinic not giving him medications that he needs. Had to watch the kids in the midst of all of this. She put herself on and can't do that anymore can't take care of anyone else if doesn't take care of self.  Sister was in hospital again for four days for pneumonia. Patient not even time to put cream on her skin because of all the demands she  has, because of taking care of the house, dogs, Mark, kids, spleen. Exhausted, tired, sleepy, tired of hurting, when rain body hurts. Discussed what self-care would look like, knows  things she has to do. Neet to rest, not hurt, son is getting better. Discussed things she enjoys and shares peaceful time if lay there and flip channels. Nobody is bothering not answering to anybody. Daughter makes her feel obligated like owe them something. Shares therapist helps feels she can be honest. Reviewed minimal support, Mom doesn't talk to her, aunt doesn't talk to, doesn't talk to anybody. Has come to a place in life where she says to herself. "The hell with it and I am all I got."  Showed therapist after she just started called "[place]", asked her a lot of questions such as how she is doing, why she can help her with coping and mental health, therapist encouraged her to continue patient agrees she will Suicidal/Homicidal: No  Therapist Response: Reviewed symptoms, facilitated expression of thoughts and feelings, utilize this as an intervention to help her with managing her emotions stress regulation.  Reviewed patient is a caretaker strength based intervention to see how significant role she plays in her family's life.  Reinforced patient's insight that she has to start taking care of herself, explored what that would mean to her.  Not only taking care of her medical issues with therapist explored positive coping strategies for stress that could include physical lifestyle changes, emotional strategies, philosophical spiritual strategies and cognitive strategies.  Worked on pinpointing  specific strategies the patient will utilize.  Also reviewed things in her day that she enjoys and can do more of.  Reviewed appetite she is started encouraged her to continue with that is that it allows her to check in, she can monitor her mood, therapist explained it adds to therapy as she checks in the same way work with therapy  sessions, she can review trends that will help her and knowing triggers, app actually asked her to explore sources.  Therapist explained when mood is negative she can then think about coping strategies she wants to use.  Therapist encourage patient to include a positive coping skill and encouraged gratitude that will help lift up her mood.  Discussed patient taking a break as a treatment recommendation for her.  Reviewed lack of support why therapy so helpful were patient get support in the way where she can be honest and working through her stressors and emotions.  Therapist provided strength based on supportive intervention.  Reviewed patient's insight that she has all she got and reframed as an empowerment insight that leads her to take the next step of having in her power to make her life the way she wants to.  Plan: Return again in 2-3 weeks.2.  Work with patient on self-care, coping for stress and mood symptoms  Diagnosis: Axis I:  generalized anxiety disorder PTSD, complicated grief, chronic pain syndrome, Major depressive disorder, recurrent moderate    Axis II: No diagnosis    Cordella Register, LCSW 11/11/2019

## 2019-11-19 ENCOUNTER — Other Ambulatory Visit: Payer: Self-pay | Admitting: Family Medicine

## 2019-11-19 DIAGNOSIS — F411 Generalized anxiety disorder: Secondary | ICD-10-CM

## 2019-11-20 DIAGNOSIS — G8929 Other chronic pain: Secondary | ICD-10-CM | POA: Diagnosis not present

## 2019-11-20 DIAGNOSIS — Z79899 Other long term (current) drug therapy: Secondary | ICD-10-CM | POA: Diagnosis not present

## 2019-11-20 DIAGNOSIS — M545 Low back pain: Secondary | ICD-10-CM | POA: Diagnosis not present

## 2019-11-20 DIAGNOSIS — F17210 Nicotine dependence, cigarettes, uncomplicated: Secondary | ICD-10-CM | POA: Diagnosis not present

## 2019-11-26 ENCOUNTER — Telehealth (INDEPENDENT_AMBULATORY_CARE_PROVIDER_SITE_OTHER): Payer: Medicare Other | Admitting: Psychiatry

## 2019-11-26 ENCOUNTER — Encounter (HOSPITAL_COMMUNITY): Payer: Self-pay | Admitting: Psychiatry

## 2019-11-26 DIAGNOSIS — F331 Major depressive disorder, recurrent, moderate: Secondary | ICD-10-CM | POA: Diagnosis not present

## 2019-11-26 DIAGNOSIS — F411 Generalized anxiety disorder: Secondary | ICD-10-CM

## 2019-11-26 DIAGNOSIS — G894 Chronic pain syndrome: Secondary | ICD-10-CM | POA: Diagnosis not present

## 2019-11-26 DIAGNOSIS — F4329 Adjustment disorder with other symptoms: Secondary | ICD-10-CM | POA: Diagnosis not present

## 2019-11-26 DIAGNOSIS — F4381 Prolonged grief disorder: Secondary | ICD-10-CM

## 2019-11-26 DIAGNOSIS — F4321 Adjustment disorder with depressed mood: Secondary | ICD-10-CM

## 2019-11-26 MED ORDER — DULOXETINE HCL 60 MG PO CPEP: ORAL_CAPSULE | ORAL | 1 refills | Status: DC

## 2019-11-26 MED ORDER — DULOXETINE HCL 30 MG PO CPEP: ORAL_CAPSULE | ORAL | 1 refills | Status: DC
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
## 2019-12-04 ENCOUNTER — Ambulatory Visit (INDEPENDENT_AMBULATORY_CARE_PROVIDER_SITE_OTHER): Payer: Medicare Other | Admitting: Family Medicine

## 2019-12-04 VITALS — BP 138/72 | HR 98 | Ht 66.0 in | Wt 291.0 lb

## 2019-12-04 DIAGNOSIS — Z01818 Encounter for other preprocedural examination: Secondary | ICD-10-CM

## 2019-12-04 DIAGNOSIS — G43019 Migraine without aura, intractable, without status migrainosus: Secondary | ICD-10-CM | POA: Diagnosis not present

## 2019-12-04 DIAGNOSIS — E278 Other specified disorders of adrenal gland: Secondary | ICD-10-CM

## 2019-12-04 DIAGNOSIS — G35 Multiple sclerosis: Secondary | ICD-10-CM

## 2019-12-04 NOTE — Progress Notes (Signed)
Unable

## 2019-12-04 NOTE — Patient Instructions (Addendum)
[place] for surgery after 12/28/2019. We can consider neurology referral for your headaches if you are not improving after they get the mold issue fixed.

## 2019-12-04 NOTE — Progress Notes (Signed)
Established Patient Office Visit  Subjective:  Patient ID: Ashley Gomez, female    DOB: 1970-07-27  Age: 49 y.o. MRN: [id number]  CC:  Chief Complaint  Patient presents with  . Hypertension  . Migraine  . Pre-op Exam    L shoulder     HPI Ashley Gomez presents for   Follow-up migraine headaches-she does feel like the Aimovig has helped her have less severe and intense migraine headaches but she still having them very frequently she is mostly relying on Tylenol since she cannot take NSAIDs since she is on blood thinner.  But she is having them almost daily.  We even added back metoprolol just to see if that would make a difference since she had been on it previously.  She has not noticed a big difference in her symptoms.  They also recently found mold in their home and she wonders if this could actually be contributing to the headaches.  She would also like to get preop clearance for shoulder surgery.  She is planning on for left rotator cuff surgery.  She has not had any problems with sedation or anesthesia.  Past Medical History:  Diagnosis Date  . Anxiety   . Arthritis   . CVA (cerebral infarction)    Old CVA on MRI brain  . Depression   . DVT (deep venous thrombosis) ([place]) 02/40/9735   L basilic vein   . Facet hypertrophy of lumbar region    MRI 2007  . Fibromyalgia   . GERD (gastroesophageal reflux disease)   . Hypertension    no meds now, lost 50lbs  . Migraines   . MS (multiple sclerosis) ([place])   . Neuromuscular disorder ([place])   . Obesity   . PTSD (post-traumatic stress disorder)   . Seizures ([employer]) 2011   no seizure since onset  . Sleep apnea    diagnosed 20 ago, lost wt no CPAP now  . Smoking   . Stroke [facility])    doesn't know when    Past Surgical History:  Procedure Laterality Date  . CHOLECYSTECTOMY  10/2017  . CHONDROPLASTY Left 12/30/2014   Procedure: CHONDROPLASTY;  Surgeon: Leandrew Koyanagi, MD;  Location: [place];  Service:  Orthopedics;  Laterality: Left;  . KNEE ARTHROSCOPY Left 12/30/2014   Procedure: LEFT KNEE ARTHROSCOPY WITH   CHONDROPLASTY;  Surgeon: Leandrew Koyanagi, MD;  Location: [place];  Service: Orthopedics;  Laterality: Left;  . PARTIAL KNEE ARTHROPLASTY Left 08/03/2016   Procedure: LEFT UNICOMPARTMENTAL KNEE ARTHROPLASTY;  Surgeon: Leandrew Koyanagi, MD;  Location: [place];  Service: Orthopedics;  Laterality: Left;  . TONSILLECTOMY    . TUBAL LIGATION  1992    Family History  Problem Relation Age of Onset  . Cancer Mother 69       melanoma  . Hypertension Sister   . Heart disease Sister        valve disease  . Depression Sister   . Diabetes Sister   . Sjogren's syndrome Sister     Social History   Socioeconomic History  . Marital status: Single    Spouse name: Not on file  . Number of children: Not on file  . Years of education: Not on file  . Highest education level: Not on file  Occupational History  . Not on file  Tobacco Use  . Smoking status: Current Every Day Smoker    Packs/day: 1.00    Years: 25.00  Pack years: 25.00    Types: Cigarettes  . Smokeless tobacco: Never Used  . Tobacco comment: advised to d/c by 3 cigs per week.  Substance and Sexual Activity  . Alcohol use: No    Alcohol/week: 0.0 standard drinks    Comment: occasional  . Drug use: No  . Sexual activity: Yes    Birth control/protection: None, Post-menopausal  Other Topics Concern  . Not on file  Social History Narrative  . Not on file   Social Determinants of Health   Financial Resource Strain:   . Difficulty of Paying Living Expenses:   Food Insecurity:   . Worried About Charity fundraiser in the Last Year:   . Arboriculturist in the Last Year:   Transportation Needs:   . Film/video editor (Medical):   Marland Kitchen Lack of Transportation (Non-Medical):   Physical Activity:   . Days of Exercise per Week:   . Minutes of Exercise per Session:   Stress:   . Feeling of Stress :   Social  Connections:   . Frequency of Communication with Friends and Family:   . Frequency of Social Gatherings with Friends and Family:   . Attends Religious Services:   . Active Member of Clubs or Organizations:   . Attends Archivist Meetings:   Marland Kitchen Marital Status:   Intimate Partner Violence:   . Fear of Current or Ex-Partner:   . Emotionally Abused:   Marland Kitchen Physically Abused:   . Sexually Abused:     Outpatient Medications Prior to Visit  Medication Sig Dispense Refill  . acetaminophen (TYLENOL) 500 MG tablet Take 1,000 mg by mouth every 6 (six) hours as needed for mild pain or headache.    . AMBULATORY NON FORMULARY MEDICATION testosterone    . ammonium lactate (LAC-HYDRIN) 12 % lotion See admin instructions.    Marland Kitchen azelastine (ASTELIN) 0.1 % nasal spray Place 2 sprays into both nostrils 2 (two) times daily. Use in each nostril as directed 30 mL 12  . busPIRone (BUSPAR) 7.5 MG tablet TAKE 1 TABLET BY MOUTH 2 TIMES DAILY AS NEEDED. 180 tablet 0  . clonazePAM (KLONOPIN) 1 MG tablet TAKE 1 TABLET TWICE A DAY AS NEEDED ANXIETY MUST LAST 30 DAYS 45 tablet 1  . cyclobenzaprine (FLEXERIL) 10 MG tablet Take 10 mg by mouth 2 (two) times daily as needed.    . DULoxetine (CYMBALTA) 30 MG capsule TAKE 1 CAPSULE BY MOUTH DAILY ALONG WITH 60MG 30 capsule 1  . DULoxetine (CYMBALTA) 60 MG capsule TAKE 1 CAPSULE BY MOUTH EVERY DAY 30 capsule 1  . Erenumab-aooe (AIMOVIG) 140 MG/ML SOAJ Inject 140 mg as directed every 30 (thirty) days. 1 pen 3  . famotidine (PEPCID) 20 MG tablet Take 1 tablet (20 mg total) by mouth daily. 90 tablet 3  . HYDROcodone-acetaminophen (NORCO) 10-325 MG tablet Take 1 tablet by mouth every 6 (six) hours as needed for pain. for pain  0  . levocetirizine (XYZAL) 5 MG tablet Take 5 mg by mouth every evening.    . metoprolol succinate (TOPROL-XL) 25 MG 24 hr tablet Take 1 tablet (25 mg total) by mouth daily. 30 tablet 2  . NARCAN 4 MG/0.1ML LIQD nasal spray kit USE AS DIRECTED    .  omeprazole (PRILOSEC) 40 MG capsule TAKE 1 CAPSULE BY MOUTH EVERY DAY 90 capsule 3  . ondansetron (ZOFRAN) 4 MG tablet Take 1 tablet (4 mg total) by mouth every 8 (eight) hours as needed  for nausea or vomiting. 30 tablet 1  . rivaroxaban (XARELTO) 20 MG TABS tablet Take by mouth.    . rizatriptan (MAXALT-MLT) 10 MG disintegrating tablet Take 1 tablet (10 mg total) by mouth daily as needed. May repeat in 2 hours if needed 10 tablet 4  . Triamcinolone Acetonide (TRIAMCINOLONE 0.1 % CREAM : EUCERIN) CREA Apply 1 application topically at bedtime. 1 Jar. 1 each 1  . celecoxib (CELEBREX) 200 MG capsule Take 2 capsules by mouth as needed for pain.  Needs appointment 180 capsule 0  . diclofenac sodium (VOLTAREN) 1 % GEL See admin instructions.     No facility-administered medications prior to visit.    Allergies  Allergen Reactions  . Ace Inhibitors     REACTION: cough  . Atenolol Other (See Comments)    Bradycardia  . Celexa [Citalopram Hydrobromide] Other (See Comments)    Dizziness Tolerated Lexapro   . Codeine Nausea Only  . Nitroglycerin Other (See Comments)    Drop in BP  . Phenytoin Swelling  . Sertraline Other (See Comments)    unknown  . Sumatriptan Nausea Only  . Tizanidine Other (See Comments)    Keep her awake.   . Topamax [Topiramate] Other (See Comments)    She reports that this makes her forgetful and causes her to studder  . Triamcinolone     Steroid flare after joint injection, use other steroid for injections    ROS Review of Systems    Objective:    Physical Exam Constitutional:      Appearance: She is well-developed.  HENT:     Head: Normocephalic and atraumatic.     Right Ear: External ear normal.     Left Ear: External ear normal.     Nose: Nose normal.     Mouth/Throat:     Mouth: Mucous membranes are moist.     Pharynx: Oropharynx is clear.  Eyes:     Conjunctiva/sclera: Conjunctivae normal.     Pupils: Pupils are equal, round, and reactive to  light.  Neck:     Thyroid: No thyromegaly.  Cardiovascular:     Rate and Rhythm: Normal rate and regular rhythm.     Heart sounds: Normal heart sounds.  Pulmonary:     Effort: Pulmonary effort is normal.     Breath sounds: Normal breath sounds. No wheezing.  Abdominal:     General: Abdomen is flat.     Palpations: Abdomen is soft.  Musculoskeletal:        General: No swelling.     Cervical back: Neck supple.  Lymphadenopathy:     Cervical: No cervical adenopathy.  Skin:    General: Skin is warm and dry.  Neurological:     Mental Status: She is alert and oriented to person, place, and time.  Psychiatric:        Mood and Affect: Mood normal.        Behavior: Behavior normal.     LMP 12/28/2014  Wt Readings from Last 3 Encounters:  10/03/19 280 lb (127 kg)  10/02/19 290 lb (131.5 kg)  08/04/19 291 lb (132 kg)     Health Maintenance Due  Topic Date Due  . Hepatitis C Screening  Never done    There are no preventive care reminders to display for this patient.  Lab Results  Component Value Date   TSH 1.10 08/04/2019   Lab Results  Component Value Date   WBC 8.1 10/02/2019   HGB 12.1 10/02/2019  HCT 38.2 10/02/2019   MCV 81.3 10/02/2019   PLT 363 10/02/2019   Lab Results  Component Value Date   NA 141 10/03/2019   K 4.0 10/03/2019   CO2 32 10/03/2019   GLUCOSE 125 (H) 10/03/2019   BUN 12 10/03/2019   CREATININE 0.76 10/03/2019   BILITOT 0.2 10/03/2019   ALKPHOS 98 11/29/2016   AST 14 10/03/2019   ALT 10 10/03/2019   PROT 5.9 (L) 10/03/2019   ALBUMIN 3.6 11/29/2016   CALCIUM 8.6 10/03/2019   ANIONGAP 11 08/04/2016   Lab Results  Component Value Date   CHOL 163 08/04/2019   Lab Results  Component Value Date   HDL 42 (L) 08/04/2019   Lab Results  Component Value Date   LDLCALC 98 08/04/2019   Lab Results  Component Value Date   TRIG 133 08/04/2019   Lab Results  Component Value Date   CHOLHDL 3.9 08/04/2019   Lab Results  Component  Value Date   HGBA1C 5.8 (H) 08/04/2019      Assessment & Plan:   Problem List Items Addressed This Visit      Cardiovascular and Mediastinum   Migraine without aura    The Aimovig has helped reduce the severity of the headaches but still occurring quite frequently the recently found mold in the home which hopefully will be corrected in the next couple of months she wants to wait and see if the headaches improve if they do not then recommend neurology referral as she has tried multiple medications in the past.        Nervous and Auditory   MULTIPLE SCLEROSIS, RELAPSING/REMITTING    Not currently being treated with medication for her MS.        Other   Adrenal mass ([place])    Recent CT documented left adrenal nodule.  It was found initially back in 2019.  Measuring 2.4 cm x 3.1 cm.  Has been stable since at least 2019.       Other Visit Diagnoses    Preop examination    -  Primary     She is cleared for surgery to hold her anticoagulant after August 15.  That way she will have been on anticoagulant for 3 months with a splenic infarct.  She wanted to hold any aspirin, NSAIDs, ginkgo, ginseng etc. at least a week prior to surgery.  She can hold her Xarelto 3 days prior to surgery.  And then restart afterwards.  Hypertension is well controlled.    No orders of the defined types were placed in this encounter.   Follow-up: Return in about 3 months (around 03/05/2020).    Beatrice Lecher, MD

## 2019-12-05 ENCOUNTER — Encounter: Payer: Self-pay | Admitting: Family Medicine

## 2019-12-05 NOTE — Assessment & Plan Note (Signed)
Not currently being treated with medication for her MS.

## 2019-12-05 NOTE — Assessment & Plan Note (Signed)
The Aimovig has helped reduce the severity of the headaches but still occurring quite frequently the recently found mold in the home which hopefully will be corrected in the next couple of months she wants to wait and see if the headaches improve if they do not then recommend neurology referral as she has tried multiple medications in the past.

## 2019-12-05 NOTE — Assessment & Plan Note (Addendum)
Recent CT documented left adrenal nodule.  It was found initially back in 2019.  Measuring 2.4 cm x 3.1 cm.  Has been stable since at least 2019.

## 2019-12-08 ENCOUNTER — Ambulatory Visit (INDEPENDENT_AMBULATORY_CARE_PROVIDER_SITE_OTHER): Payer: Medicare Other | Admitting: Licensed Clinical Social Worker

## 2019-12-08 ENCOUNTER — Other Ambulatory Visit: Payer: Self-pay

## 2019-12-08 DIAGNOSIS — G894 Chronic pain syndrome: Secondary | ICD-10-CM | POA: Diagnosis not present

## 2019-12-08 DIAGNOSIS — F4310 Post-traumatic stress disorder, unspecified: Secondary | ICD-10-CM

## 2019-12-08 DIAGNOSIS — F4329 Adjustment disorder with other symptoms: Secondary | ICD-10-CM | POA: Diagnosis not present

## 2019-12-08 DIAGNOSIS — F411 Generalized anxiety disorder: Secondary | ICD-10-CM

## 2019-12-08 DIAGNOSIS — F331 Major depressive disorder, recurrent, moderate: Secondary | ICD-10-CM

## 2019-12-08 DIAGNOSIS — F4321 Adjustment disorder with depressed mood: Secondary | ICD-10-CM

## 2019-12-08 NOTE — Progress Notes (Signed)
   THERAPIST PROGRESS NOTE  Session Time: 4:00 PM-4:52 PM  Participation Level: Active  Behavioral Response: CasualAlertAnxious  Type of Therapy: Individual Therapy  Treatment Goals addressed: supportive interventions continue to be significant intervention for patient and continue to work on management decreasing depression, anxiety, stress management, trauma, grief, coping  Interventions: CBT, Solution Focused, Strength-based, Supportive, Reframing and Other: coping  Summary: Ashley Gomez is a 49 y.o. female who presents with more worries about dying a lot more lately.  Went ahead and made arrangements for burial.  Therapist pointed this out is responsible for her to do at this point. Shares that she sweats and heart beating out of chest lately when around at least 5 people like if at the store. Thinking about how nobody talks to her in family and "if they can sleep at night I am ok, doesn't understand doesn't have to like it". She has kids and grand kids. Reviewed 7 grandchildren- Beverly Milch one year old baby girl, baby mark 59 years old, [name]-9 in September oldest of [name]'s kids. Whitnee almost 16, [name] just turned [place], [place], [name]-9, [name]-8. Shares having the grandkids she feels gives her more to worry about, worries about them constantly. Malachi Carl will be coming to live with patient. Patient hopes to be a good grandmother and a good role model.  Discussed making funeral arrangements her motivation is need to take care of so kids don't have to worry. Shares that her heart beats out of chest when wake up and lays down again, doesn't know what is going on. Takes 1/2 klonopin. Shares now when feeling depressed or down get sad but instead of crying get angry at the least little thing.  Discussed this positive she stands up for herself more but at the same time helpful to address anger more. Son is better in cervical collar. Won't be long he will betaking off soon, still though dealing  with nerve damage from neck to toes so might take awhile.  In reviewing stressors patient says not hearing from family is not a major stressor as for her she says "here we go again" has dealt with that all her life   Suicidal/Homicidal: No  Therapist Response: Therapist reviewed treatment plan, facilitated expression of thoughts and feelings, reviewed symptoms, identify anxiety as significant symptom and provided education using CBT strategies to discuss anxiety is a false alarm often, it is misfiring of the fighter flight, can deactivated through deep breathing, physiological interventions as well as recognizing it it often is a false alarm needs to be tested rather than trusted.  Reviewed underlying sources for anxiety per therapist perspective as patient has had a lot of significant events happen that could escalate her anxiety such as loss of sons fianc, near death experience of son, patient's own health issues.  Continue to emphasize importance for patient has an family strength-based intervention.  Reframe discussing patient making funeral arrangements as a positive step to be responsible as we get older.  Therapist provided open questions, active listening supportive interventions.  Reviewed anger management strategies to include underlying anger can be heard feelings, unmet needs and to explore further will work on at next session  Plan: Return again in 3-4 weeks.2.  Therapist work with patient on stress management, anger management, coping  Diagnosis: Axis I: generalized anxiety disorder PTSD, complicated grief, chronic pain syndrome, Major depressive disorder, recurrent moderate    Axis II: No diagnosis    Cordella Register, LCSW 12/08/2019

## 2019-12-10 ENCOUNTER — Encounter: Payer: Self-pay | Admitting: Family Medicine

## 2019-12-10 MED ORDER — METFORMIN HCL ER 500 MG PO TB24: 500.0000 mg | ORAL_TABLET | Freq: Every day | ORAL | 1 refills | Status: DC

## 2019-12-10 NOTE — Telephone Encounter (Signed)
She still in the prediabetes range so I will send over prescription for oral Metformin.  This is currently the recommended treatment for prediabetes.  In regards to the anxiety I do not write for Ativan.  I am happy to refer her to psychiatry if she is really struggling and see if they would recommend something different for her anxiety.

## 2019-12-14 ENCOUNTER — Encounter: Payer: Self-pay | Admitting: Family Medicine

## 2019-12-16 DIAGNOSIS — G8929 Other chronic pain: Secondary | ICD-10-CM | POA: Diagnosis not present

## 2019-12-16 DIAGNOSIS — F17210 Nicotine dependence, cigarettes, uncomplicated: Secondary | ICD-10-CM | POA: Diagnosis not present

## 2019-12-16 DIAGNOSIS — M545 Low back pain: Secondary | ICD-10-CM | POA: Diagnosis not present

## 2019-12-16 DIAGNOSIS — R05 Cough: Secondary | ICD-10-CM | POA: Diagnosis not present

## 2019-12-16 DIAGNOSIS — G44209 Tension-type headache, unspecified, not intractable: Secondary | ICD-10-CM | POA: Diagnosis not present

## 2019-12-16 DIAGNOSIS — Z79899 Other long term (current) drug therapy: Secondary | ICD-10-CM | POA: Diagnosis not present

## 2019-12-25 ENCOUNTER — Encounter: Payer: Self-pay | Admitting: Family Medicine

## 2019-12-29 ENCOUNTER — Other Ambulatory Visit (HOSPITAL_COMMUNITY): Payer: Medicare Other

## 2020-01-07 ENCOUNTER — Encounter: Payer: Self-pay | Admitting: Family Medicine

## 2020-01-07 ENCOUNTER — Telehealth (HOSPITAL_COMMUNITY): Payer: Self-pay

## 2020-01-07 MED ORDER — HYDROXYZINE PAMOATE 25 MG PO CAPS
25.0000 mg | ORAL_CAPSULE | Freq: Every evening | ORAL | 0 refills | Status: DC | PRN
Start: 2020-01-07 — End: 2020-01-28

## 2020-01-07 MED ORDER — METOPROLOL SUCCINATE ER 25 MG PO TB24: 25.0000 mg | ORAL_TABLET | Freq: Every day | ORAL | 2 refills | Status: DC

## 2020-01-07 NOTE — Telephone Encounter (Signed)
Patient wants to know if you can give her Ativan or something similar to help her sleep. She states that she is having surgery on 09/02. Please advise

## 2020-01-07 NOTE — Telephone Encounter (Signed)
Left vm informing patient of medication sent to pharmacy for sleep

## 2020-01-07 NOTE — Telephone Encounter (Signed)
I am sending vistaril 25mg  for night sleep if needed.

## 2020-01-08 ENCOUNTER — Encounter (HOSPITAL_BASED_OUTPATIENT_CLINIC_OR_DEPARTMENT_OTHER): Payer: Self-pay | Admitting: Orthopaedic Surgery

## 2020-01-08 ENCOUNTER — Other Ambulatory Visit: Payer: Self-pay | Admitting: Family Medicine

## 2020-01-08 ENCOUNTER — Other Ambulatory Visit: Payer: Self-pay

## 2020-01-08 ENCOUNTER — Encounter: Payer: Self-pay | Admitting: Family Medicine

## 2020-01-08 DIAGNOSIS — F411 Generalized anxiety disorder: Secondary | ICD-10-CM

## 2020-01-09 MED ORDER — CLONAZEPAM 1 MG PO TABS: ORAL_TABLET | ORAL | 1 refills | Status: DC

## 2020-01-09 NOTE — Telephone Encounter (Signed)
Aimovig sent.  Clonazepam last written 11/20/2019 #45 with one refill

## 2020-01-12 ENCOUNTER — Other Ambulatory Visit (HOSPITAL_COMMUNITY)
Admission: RE | Admit: 2020-01-12 | Discharge: 2020-01-12 | Disposition: A | Discharge status: Home / Self Care | Payer: Medicare Other | Source: Ambulatory Visit | Attending: Orthopaedic Surgery | Admitting: Orthopaedic Surgery

## 2020-01-12 ENCOUNTER — Encounter (HOSPITAL_BASED_OUTPATIENT_CLINIC_OR_DEPARTMENT_OTHER)
Admission: RE | Admit: 2020-01-12 | Discharge: 2020-01-12 | Disposition: A | Discharge status: Home / Self Care | Payer: Medicare Other | Source: Ambulatory Visit | Attending: Orthopaedic Surgery | Admitting: Orthopaedic Surgery

## 2020-01-12 ENCOUNTER — Ambulatory Visit (HOSPITAL_COMMUNITY): Payer: Medicare Other | Admitting: Licensed Clinical Social Worker

## 2020-01-12 DIAGNOSIS — G43909 Migraine, unspecified, not intractable, without status migrainosus: Secondary | ICD-10-CM | POA: Diagnosis not present

## 2020-01-12 DIAGNOSIS — F331 Major depressive disorder, recurrent, moderate: Secondary | ICD-10-CM

## 2020-01-12 DIAGNOSIS — Z01812 Encounter for preprocedural laboratory examination: Secondary | ICD-10-CM | POA: Insufficient documentation

## 2020-01-12 DIAGNOSIS — M545 Low back pain: Secondary | ICD-10-CM | POA: Diagnosis not present

## 2020-01-12 DIAGNOSIS — F4321 Adjustment disorder with depressed mood: Secondary | ICD-10-CM

## 2020-01-12 DIAGNOSIS — F17210 Nicotine dependence, cigarettes, uncomplicated: Secondary | ICD-10-CM | POA: Diagnosis not present

## 2020-01-12 DIAGNOSIS — Z79899 Other long term (current) drug therapy: Secondary | ICD-10-CM | POA: Diagnosis not present

## 2020-01-12 DIAGNOSIS — F411 Generalized anxiety disorder: Secondary | ICD-10-CM

## 2020-01-12 DIAGNOSIS — F4310 Post-traumatic stress disorder, unspecified: Secondary | ICD-10-CM

## 2020-01-12 DIAGNOSIS — G8929 Other chronic pain: Secondary | ICD-10-CM | POA: Diagnosis not present

## 2020-01-12 DIAGNOSIS — Z20822 Contact with and (suspected) exposure to covid-19: Secondary | ICD-10-CM | POA: Diagnosis not present

## 2020-01-12 DIAGNOSIS — G894 Chronic pain syndrome: Secondary | ICD-10-CM

## 2020-01-12 LAB — BASIC METABOLIC PANEL
Anion gap: 10 (ref 5–15)
BUN: 10 mg/dL (ref 6–20)
CO2: 26 mmol/L (ref 22–32)
Calcium: 9.1 mg/dL (ref 8.9–10.3)
Chloride: 102 mmol/L (ref 98–111)
Creatinine, Ser: 0.74 mg/dL (ref 0.44–1.00)
GFR calc Af Amer: >60 mL/min (ref >60)
GFR calc non Af Amer: >60 mL/min (ref >60)
Glucose, Bld: 99 mg/dL (ref 70–99)
Potassium: 4.2 mmol/L (ref 3.5–5.1)
Sodium: 138 mmol/L (ref 135–145)

## 2020-01-12 LAB — SARS CORONAVIRUS 2 (TAT 6-24 HRS): SARS Coronavirus 2: NEGATIVE

## 2020-01-12 NOTE — Addendum Note (Signed)
Addended by: Cordella Register A on: 01/12/2020 02:50 PM   Modules accepted: Level of Service

## 2020-01-12 NOTE — Progress Notes (Signed)
Anesthesia consult per Dr. Sabra Heck, will proceed with surgery as scheduled.

## 2020-01-12 NOTE — Progress Notes (Signed)

## 2020-01-12 NOTE — Progress Notes (Signed)
Virtual Visit via Telephone Note  Therapist-home office Patient-car I connected with Ashley Gomez on 01/12/20 at  2:00 PM EDT by telephone and verified that I am speaking with the correct person using two identifiers.   I discussed the limitations, risks, security and privacy concerns of performing an evaluation and management service by telephone and the availability of in person appointments. I also discussed with the patient that there may be a patient responsible charge related to this service. The patient expressed understanding and agreed to proceed.   I discussed the assessment and treatment plan with the patient. The patient was provided an opportunity to ask questions and all were answered. The patient agreed with the plan and demonstrated an understanding of the instructions.   The patient was advised to call back or seek an in-person evaluation if the symptoms worsen or if the condition fails to improve as anticipated.  I provided 10 minutes of non-face-to-face time during this encounter.  THERAPIST PROGRESS NOTE  Session Time: 2:20 PM to 2:30 PM  Participation Level: Active  Behavioral Response: CasualAlertappropriate  Type of Therapy: Individual Therapy  Treatment Goals addressed:  supportive interventions continue to be significant intervention for patient and continue to work on management decreasing depression, anxiety, stress management, trauma, grief, coping Interventions: Solution Focused and Strength-based  Summary: Ashley Gomez is a 49 y.o. female who presents with shoulder on surgery on Thursday. Have been at doctor's since 10 AM. Pain management, surgery center [facility] work. [place] for COVID test. Only one offering COVID test for free. Therapist provided supportive interventions for her surgery. Therapist did not stay on phone with patient because she was driving while on appointment, for safety reasons rescheduled.   Suicidal/Homicidal:  No  Plan: Return again in 4 weeks.2.Therapist work with patient on stress management, anger management, coping Diagnosis: Axis I: generalized anxiety disorder PTSD, complicated grief, chronic pain syndrome, Major depressive disorder, recurrent moderate   Axis II: No diagnosis    Ashley Register, LCSW 01/12/2020

## 2020-01-13 ENCOUNTER — Telehealth: Payer: Self-pay

## 2020-01-13 NOTE — Telephone Encounter (Signed)
Restart the Xarelto the evening after surgery.  We can discuss migraines at follow up

## 2020-01-13 NOTE — Telephone Encounter (Signed)
Patient called stating she stopped her Xarelto on Sunday per Dr Gardiner Ramus instructions. She has surgery on Thursday. Wanting to know what day she is to restart this medication.    Patient also states she gets a really bad headache after her Aimovig injections and wanted to know if maybe she could switch it to [employer]?

## 2020-01-14 NOTE — Telephone Encounter (Signed)
Patient advised.

## 2020-01-14 NOTE — H&P (Signed)
PREOPERATIVE H&P  Chief Complaint: LEFT SHOULDER PRIMARY OSTEOARTHRITIS BURSITIS, ROTATOR CUFF TEAR  HPI: Ashley Gomez is a 49 y.o. female who SHOULDER ARTHROSCOPY DEBRIDMENT WITH ROTATOR CUFF REPAIR, SUBACROMIAL DECOMPRESSION WITH ACROMIPLASTY, AND BICEP TENODESIS SHOULDER ARTHROSCOPY WITH DISTAL CLAVICULECTOMY.   Patient has a past medical history significant for TIA over 8 years ago, PTSD, MS, HTN, GERD, fibromyalgia, DVT in 2011, and anxiety.   The patient is a 49 year old who has had acute left shoulder pain after moving a king-size mattress in August.  She had immediate pain afterwards and has had multiple injections and failed that.  She had physical therapy and failed that.  She has a history of DVT after a fall in the past in her left leg and shoulder.  She is not on any anticoagulants now. She is very debilitated by her pain.    Her symptoms are rated as moderate to severe, and have been worsening.  This is significantly impairing activities of daily living.    Please see clinic note for further details on this patient's care.    She has elected for surgical management.   Past Medical History:  Diagnosis Date  . Anxiety   . Arthritis   . CVA (cerebral infarction)    Old CVA on MRI brain  . Depression   . DVT (deep venous thrombosis) ([place]) 38/18/2993   L basilic vein   . Facet hypertrophy of lumbar region    MRI 2007  . Fibromyalgia   . GERD (gastroesophageal reflux disease)   . Hypertension   . Migraines   . MS (multiple sclerosis) ([place])   . Neuromuscular disorder ([place])   . Obesity   . Pre-diabetes   . PTSD (post-traumatic stress disorder)   . Seizures ([place]) 2011   no seizure since onset  . Smoking   . Stroke [facility])    TIA, >8years ago   Past Surgical History:  Procedure Laterality Date  . CHOLECYSTECTOMY  10/2017  . CHONDROPLASTY Left 12/30/2014   Procedure: CHONDROPLASTY;  Surgeon: Leandrew Koyanagi, MD;  Location: [facility];  Service:  Orthopedics;  Laterality: Left;  . KNEE ARTHROSCOPY Left 12/30/2014   Procedure: LEFT KNEE ARTHROSCOPY WITH   CHONDROPLASTY;  Surgeon: Leandrew Koyanagi, MD;  Location: [facility];  Service: Orthopedics;  Laterality: Left;  . PARTIAL KNEE ARTHROPLASTY Left 08/03/2016   Procedure: LEFT UNICOMPARTMENTAL KNEE ARTHROPLASTY;  Surgeon: Leandrew Koyanagi, MD;  Location: [place];  Service: Orthopedics;  Laterality: Left;  . TONSILLECTOMY    . TUBAL LIGATION  1992   Social History   Socioeconomic History  . Marital status: Single    Spouse name: Not on file  . Number of children: Not on file  . Years of education: Not on file  . Highest education level: Not on file  Occupational History  . Not on file  Tobacco Use  . Smoking status: Current Every Day Smoker    Packs/day: 1.00    Years: 25.00    Pack years: 25.00    Types: Cigarettes  . Smokeless tobacco: Never Used  . Tobacco comment: advised to d/c by 3 cigs per week.  Substance and Sexual Activity  . Alcohol use: No    Alcohol/week: 0.0 standard drinks  . Drug use: No  . Sexual activity: Yes    Birth control/protection: None, Post-menopausal  Other Topics Concern  . Not on file  Social History Narrative  . Not on file   Social  Determinants of Health   Financial Resource Strain:   . Difficulty of Paying Living Expenses: Not on file  Food Insecurity:   . Worried About Charity fundraiser in the Last Year: Not on file  . Ran Out of Food in the Last Year: Not on file  Transportation Needs:   . Lack of Transportation (Medical): Not on file  . Lack of Transportation (Non-Medical): Not on file  Physical Activity:   . Days of Exercise per Week: Not on file  . Minutes of Exercise per Session: Not on file  Stress:   . Feeling of Stress : Not on file  Social Connections:   . Frequency of Communication with Friends and Family: Not on file  . Frequency of Social Gatherings with Friends and Family: Not on file  . Attends Religious  Services: Not on file  . Active Member of Clubs or Organizations: Not on file  . Attends Archivist Meetings: Not on file  . Marital Status: Not on file   Family History  Problem Relation Age of Onset  . Cancer Mother 38       melanoma  . Hypertension Sister   . Heart disease Sister        valve disease  . Depression Sister   . Diabetes Sister   . Sjogren's syndrome Sister    Allergies  Allergen Reactions  . Ace Inhibitors     REACTION: cough  . Atenolol Other (See Comments)    Bradycardia  . Celexa [Citalopram Hydrobromide] Other (See Comments)    Dizziness Tolerated Lexapro   . Codeine Nausea Only  . Nitroglycerin Other (See Comments)    Drop in BP  . Phenytoin Swelling  . Sertraline Other (See Comments)    unknown  . Sumatriptan Nausea Only  . Tizanidine Other (See Comments)    Keep her awake.   . Topamax [Topiramate] Other (See Comments)    She reports that this makes her forgetful and causes her to studder  . Triamcinolone     Steroid flare after joint injection, use other steroid for injections   Prior to Admission medications   Medication Sig Start Date End Date Taking? Authorizing Provider  acetaminophen (TYLENOL) 500 MG tablet Take 1,000 mg by mouth every 6 (six) hours as needed for mild pain or headache.   Yes [provider]  AMBULATORY NON FORMULARY MEDICATION testosterone 07/26/19  Yes [provider]  ammonium lactate (LAC-HYDRIN) 12 % lotion See admin instructions. 07/23/19  Yes [provider]  busPIRone (BUSPAR) 7.5 MG tablet TAKE 1 TABLET BY MOUTH 2 TIMES DAILY AS NEEDED. 09/26/19  Yes Merian Capron, MD  cyclobenzaprine (FLEXERIL) 10 MG tablet Take 10 mg by mouth 2 (two) times daily as needed. 07/28/19  Yes [provider]  DULoxetine (CYMBALTA) 30 MG capsule TAKE 1 CAPSULE BY MOUTH DAILY ALONG WITH 60MG 11/26/19  Yes Merian Capron, MD  DULoxetine (CYMBALTA) 60 MG capsule TAKE 1 CAPSULE BY MOUTH EVERY DAY  11/26/19  Yes Merian Capron, MD  famotidine (PEPCID) 20 MG tablet Take 1 tablet (20 mg total) by mouth daily. 08/04/19  Yes Hali Marry, MD  HYDROcodone-acetaminophen (NORCO) 10-325 MG tablet Take 1 tablet by mouth every 6 (six) hours as needed for pain. for pain 03/25/18  Yes [provider]  metFORMIN (GLUCOPHAGE-XR) 500 MG 24 hr tablet Take 1 tablet (500 mg total) by mouth daily with breakfast. 12/10/19  Yes Hali Marry, MD  metoprolol succinate (TOPROL-XL) 25  MG 24 hr tablet Take 1 tablet (25 mg total) by mouth daily. 01/07/20  Yes Hali Marry, MD  omeprazole (PRILOSEC) 40 MG capsule TAKE 1 CAPSULE BY MOUTH EVERY DAY 08/04/19  Yes Hali Marry, MD  ondansetron (ZOFRAN) 4 MG tablet Take 1 tablet (4 mg total) by mouth every 8 (eight) hours as needed for nausea or vomiting. 08/04/19  Yes Hali Marry, MD  rivaroxaban (XARELTO) 20 MG TABS tablet Take by mouth. 10/28/19  Yes [provider]  Triamcinolone Acetonide (TRIAMCINOLONE 0.1 % CREAM : EUCERIN) CREA Apply 1 application topically at bedtime. 1 Jar. 05/29/18  Yes Hali Marry, MD  AIMOVIG 140 MG/ML SOAJ INJECT 140 MG AS DIRECTED EVERY 30 (THIRTY) DAYS. 01/09/20   Hali Marry, MD  clonazePAM (KLONOPIN) 1 MG tablet TAKE 1 TABLET TWICE A DAY AS NEEDED ANXIETY MUST LAST 30 DAYS 01/19/20   Hali Marry, MD  hydrOXYzine (VISTARIL) 25 MG capsule Take 1 capsule (25 mg total) by mouth at bedtime as needed for itching. 01/07/20   Merian Capron, MD  [facility] 4 MG/0.1ML LIQD nasal spray kit USE AS DIRECTED 02/07/19   [provider]    ROS: All other systems have been reviewed and were otherwise negative with the exception of those mentioned in the HPI and as above.  Physical Exam: General: Alert, no acute distress Cardiovascular: No pedal edema Respiratory: No cyanosis, no use of accessory musculature GI: No organomegaly, abdomen is soft and non-tender Skin: No  lesions in the area of chief complaint Neurologic: Sensation intact distally Psychiatric: Patient is competent for consent with normal mood and affect Lymphatic: No axillary or cervical lymphadenopathy  MUSCULOSKELETAL:  Examination of her left shoulder demonstrates active elevation about 90 degrees, passive to 110 degrees, positive AC tenderness to palpation and impingement on O'Brien's.  4/5 cuff strength.    Imaging: MRI shows full thickness supraspinatus tear, retracted to midhumerus about 2-3 cm, AC arthrosis, and type 2 acromion.    Assessment: LEFT SHOULDER PRIMARY OSTEOARTHRITIS BURSITIS, ROTATOR CUFF TEAR  Plan: Plan for Procedure(s): SHOULDER ARTHROSCOPY DEBRIDMENT WITH ROTATOR CUFF REPAIR, SUBACROMIAL DECOMPRESSION WITH ACROMIPLASTY, AND BICEP TENODESIS SHOULDER ARTHROSCOPY WITH DISTAL CLAVICULECTOMY  The risks benefits and alternatives were discussed with the patient including but not limited to the risks of nonoperative treatment, versus surgical intervention including infection, bleeding, nerve injury,  blood clots, cardiopulmonary complications, morbidity, mortality, among others, and they were willing to proceed.   The patient acknowledged the explanation, agreed to proceed with the plan and consent was signed.   Dr. Ward Chatters has cleared the patient for surgery. Dr. Arlie Solomons, her pain management provider, will provide pain management prescriptions post-operatively.  Operative Plan: Left shoulder scope with SAD, DCE, BT, RCR Discharge Medications: Tylenol, Celebrex, Zofran DVT Prophylaxis: Xarelto Physical Therapy: Outpatient PT Special Discharge needs: [place], PA-C  01/14/2020 1:39 PM

## 2020-01-15 ENCOUNTER — Encounter (HOSPITAL_BASED_OUTPATIENT_CLINIC_OR_DEPARTMENT_OTHER)
Admission: RE | Disposition: A | Discharge status: Home / Self Care | Payer: Self-pay | Source: Home / Self Care | Attending: Orthopaedic Surgery

## 2020-01-15 ENCOUNTER — Ambulatory Visit (HOSPITAL_BASED_OUTPATIENT_CLINIC_OR_DEPARTMENT_OTHER)
Admission: RE | Admit: 2020-01-15 | Discharge: 2020-01-15 | Disposition: A | Discharge status: Home / Self Care | Payer: Medicare Other | Attending: Orthopaedic Surgery | Admitting: Orthopaedic Surgery

## 2020-01-15 ENCOUNTER — Ambulatory Visit (HOSPITAL_BASED_OUTPATIENT_CLINIC_OR_DEPARTMENT_OTHER): Payer: Medicare Other | Admitting: Certified Registered"

## 2020-01-15 ENCOUNTER — Other Ambulatory Visit: Payer: Self-pay

## 2020-01-15 ENCOUNTER — Encounter (HOSPITAL_BASED_OUTPATIENT_CLINIC_OR_DEPARTMENT_OTHER): Payer: Self-pay | Admitting: Orthopaedic Surgery

## 2020-01-15 DIAGNOSIS — Z8673 Personal history of transient ischemic attack (TIA), and cerebral infarction without residual deficits: Secondary | ICD-10-CM | POA: Diagnosis not present

## 2020-01-15 DIAGNOSIS — F329 Major depressive disorder, single episode, unspecified: Secondary | ICD-10-CM | POA: Diagnosis not present

## 2020-01-15 DIAGNOSIS — M75122 Complete rotator cuff tear or rupture of left shoulder, not specified as traumatic: Secondary | ICD-10-CM | POA: Diagnosis not present

## 2020-01-15 DIAGNOSIS — Z7901 Long term (current) use of anticoagulants: Secondary | ICD-10-CM | POA: Diagnosis not present

## 2020-01-15 DIAGNOSIS — K219 Gastro-esophageal reflux disease without esophagitis: Secondary | ICD-10-CM | POA: Diagnosis not present

## 2020-01-15 DIAGNOSIS — Z96652 Presence of left artificial knee joint: Secondary | ICD-10-CM | POA: Insufficient documentation

## 2020-01-15 DIAGNOSIS — X58XXXA Exposure to other specified factors, initial encounter: Secondary | ICD-10-CM | POA: Insufficient documentation

## 2020-01-15 DIAGNOSIS — G35 Multiple sclerosis: Secondary | ICD-10-CM | POA: Diagnosis not present

## 2020-01-15 DIAGNOSIS — Z79899 Other long term (current) drug therapy: Secondary | ICD-10-CM | POA: Diagnosis not present

## 2020-01-15 DIAGNOSIS — G43909 Migraine, unspecified, not intractable, without status migrainosus: Secondary | ICD-10-CM | POA: Insufficient documentation

## 2020-01-15 DIAGNOSIS — G8918 Other acute postprocedural pain: Secondary | ICD-10-CM | POA: Diagnosis not present

## 2020-01-15 DIAGNOSIS — Z6841 Body Mass Index (BMI) 40.0 and over, adult: Secondary | ICD-10-CM | POA: Insufficient documentation

## 2020-01-15 DIAGNOSIS — M7552 Bursitis of left shoulder: Secondary | ICD-10-CM | POA: Diagnosis not present

## 2020-01-15 DIAGNOSIS — R7303 Prediabetes: Secondary | ICD-10-CM | POA: Insufficient documentation

## 2020-01-15 DIAGNOSIS — F419 Anxiety disorder, unspecified: Secondary | ICD-10-CM | POA: Diagnosis not present

## 2020-01-15 DIAGNOSIS — I10 Essential (primary) hypertension: Secondary | ICD-10-CM | POA: Diagnosis not present

## 2020-01-15 DIAGNOSIS — F17210 Nicotine dependence, cigarettes, uncomplicated: Secondary | ICD-10-CM | POA: Diagnosis not present

## 2020-01-15 DIAGNOSIS — M75102 Unspecified rotator cuff tear or rupture of left shoulder, not specified as traumatic: Secondary | ICD-10-CM | POA: Diagnosis not present

## 2020-01-15 DIAGNOSIS — F4310 Post-traumatic stress disorder, unspecified: Secondary | ICD-10-CM | POA: Diagnosis not present

## 2020-01-15 DIAGNOSIS — Z86718 Personal history of other venous thrombosis and embolism: Secondary | ICD-10-CM | POA: Diagnosis not present

## 2020-01-15 DIAGNOSIS — S46012A Strain of muscle(s) and tendon(s) of the rotator cuff of left shoulder, initial encounter: Secondary | ICD-10-CM | POA: Diagnosis not present

## 2020-01-15 DIAGNOSIS — M19012 Primary osteoarthritis, left shoulder: Secondary | ICD-10-CM | POA: Insufficient documentation

## 2020-01-15 DIAGNOSIS — M719 Bursopathy, unspecified: Secondary | ICD-10-CM | POA: Diagnosis not present

## 2020-01-15 DIAGNOSIS — M24112 Other articular cartilage disorders, left shoulder: Secondary | ICD-10-CM | POA: Diagnosis not present

## 2020-01-15 DIAGNOSIS — M797 Fibromyalgia: Secondary | ICD-10-CM | POA: Diagnosis not present

## 2020-01-15 DIAGNOSIS — E669 Obesity, unspecified: Secondary | ICD-10-CM | POA: Diagnosis not present

## 2020-01-15 HISTORY — PX: SHOULDER ARTHROSCOPY WITH BICEPSTENOTOMY: SHX6204

## 2020-01-15 HISTORY — DX: Prediabetes: R73.03

## 2020-01-15 HISTORY — PX: SHOULDER ARTHROSCOPY WITH ROTATOR CUFF REPAIR AND SUBACROMIAL DECOMPRESSION: SHX5686

## 2020-01-15 HISTORY — PX: SHOULDER ARTHROSCOPY WITH DISTAL CLAVICLE RESECTION: SHX5675

## 2020-01-15 LAB — GLUCOSE, CAPILLARY
Glucose-Capillary: 123 mg/dL — ABNORMAL HIGH (ref 70–99)
Glucose-Capillary: 140 mg/dL — ABNORMAL HIGH (ref 70–99)

## 2020-01-15 SURGERY — SHOULDER ARTHROSCOPY WITH ROTATOR CUFF REPAIR AND SUBACROMIAL DECOMPRESSION
Anesthesia: General | Site: Shoulder | Laterality: Left

## 2020-01-15 MED ORDER — FENTANYL CITRATE (PF) 100 MCG/2ML IJ SOLN
INTRAMUSCULAR | Status: DC | PRN
Start: 2020-01-15 — End: 2020-01-15
  Administered 2020-01-15: 50 ug via INTRAVENOUS

## 2020-01-15 MED ORDER — FENTANYL CITRATE (PF) 100 MCG/2ML IJ SOLN
INTRAMUSCULAR | Status: AC
  Filled 2020-01-15: qty 2

## 2020-01-15 MED ORDER — CELECOXIB 100 MG PO CAPS: 100.0000 mg | ORAL_CAPSULE | Freq: Two times a day (BID) | ORAL | 0 refills | Status: AC

## 2020-01-15 MED ORDER — CEFAZOLIN SODIUM-DEXTROSE 1-4 GM/50ML-% IV SOLN
INTRAVENOUS | Status: AC
  Filled 2020-01-15: qty 50

## 2020-01-15 MED ORDER — FENTANYL CITRATE (PF) 100 MCG/2ML IJ SOLN
50.0000 ug | Freq: Once | INTRAMUSCULAR | Status: AC
  Administered 2020-01-15: 100 ug via INTRAVENOUS

## 2020-01-15 MED ORDER — MIDAZOLAM HCL 2 MG/2ML IJ SOLN
INTRAMUSCULAR | Status: AC
  Filled 2020-01-15: qty 2

## 2020-01-15 MED ORDER — OXYCODONE HCL 5 MG PO TABS
ORAL_TABLET | ORAL | Status: AC
  Filled 2020-01-15: qty 1

## 2020-01-15 MED ORDER — ONDANSETRON HCL 4 MG/2ML IJ SOLN
INTRAMUSCULAR | Status: DC | PRN
  Administered 2020-01-15: 4 mg via INTRAVENOUS

## 2020-01-15 MED ORDER — DEXMEDETOMIDINE (PRECEDEX) IN NS 20 MCG/5ML (4 MCG/ML) IV SYRINGE
PREFILLED_SYRINGE | INTRAVENOUS | Status: AC
  Filled 2020-01-15: qty 5

## 2020-01-15 MED ORDER — PROPOFOL 10 MG/ML IV BOLUS
INTRAVENOUS | Status: DC | PRN
  Administered 2020-01-15: 180 mg via INTRAVENOUS

## 2020-01-15 MED ORDER — FENTANYL CITRATE (PF) 100 MCG/2ML IJ SOLN: 25.0000 ug | INTRAMUSCULAR | Status: DC | PRN

## 2020-01-15 MED ORDER — PHENYLEPHRINE HCL (PRESSORS) 10 MG/ML IV SOLN
INTRAVENOUS | Status: DC | PRN
  Administered 2020-01-15 (×2): 80 ug via INTRAVENOUS
  Administered 2020-01-15: 120 ug via INTRAVENOUS

## 2020-01-15 MED ORDER — OXYCODONE HCL 5 MG PO TABS
5.0000 mg | ORAL_TABLET | Freq: Once | ORAL | Status: AC
  Administered 2020-01-15: 5 mg via ORAL

## 2020-01-15 MED ORDER — MIDAZOLAM HCL 2 MG/2ML IJ SOLN
1.0000 mg | Freq: Once | INTRAMUSCULAR | Status: AC
  Administered 2020-01-15: 2 mg via INTRAVENOUS

## 2020-01-15 MED ORDER — DEXAMETHASONE SODIUM PHOSPHATE 4 MG/ML IJ SOLN
INTRAMUSCULAR | Status: DC | PRN
  Administered 2020-01-15: 10 mg via INTRAVENOUS

## 2020-01-15 MED ORDER — LIDOCAINE HCL (CARDIAC) PF 100 MG/5ML IV SOSY
PREFILLED_SYRINGE | INTRAVENOUS | Status: DC | PRN
  Administered 2020-01-15: 50 mg via INTRAVENOUS

## 2020-01-15 MED ORDER — SUCCINYLCHOLINE CHLORIDE 20 MG/ML IJ SOLN
INTRAMUSCULAR | Status: DC | PRN
  Administered 2020-01-15: 140 mg via INTRAVENOUS

## 2020-01-15 MED ORDER — DEXTROSE 5 % IV SOLN
3.0000 g | INTRAVENOUS | Status: AC
  Administered 2020-01-15: 3 g via INTRAVENOUS

## 2020-01-15 MED ORDER — ONDANSETRON HCL 4 MG PO TABS: 4.0000 mg | ORAL_TABLET | Freq: Three times a day (TID) | ORAL | 1 refills | Status: AC | PRN

## 2020-01-15 MED ORDER — ACETAMINOPHEN 500 MG PO TABS: 1000.0000 mg | ORAL_TABLET | Freq: Three times a day (TID) | ORAL | 0 refills | Status: AC

## 2020-01-15 MED ORDER — SUGAMMADEX SODIUM 500 MG/5ML IV SOLN
INTRAVENOUS | Status: AC
  Filled 2020-01-15: qty 5

## 2020-01-15 MED ORDER — SUGAMMADEX SODIUM 500 MG/5ML IV SOLN
INTRAVENOUS | Status: DC | PRN
  Administered 2020-01-15: 300 mg via INTRAVENOUS

## 2020-01-15 MED ORDER — ROCURONIUM BROMIDE 10 MG/ML (PF) SYRINGE
PREFILLED_SYRINGE | INTRAVENOUS | Status: AC
  Filled 2020-01-15: qty 10

## 2020-01-15 MED ORDER — LIDOCAINE 2% (20 MG/ML) 5 ML SYRINGE
INTRAMUSCULAR | Status: AC
  Filled 2020-01-15: qty 5

## 2020-01-15 MED ORDER — SODIUM CHLORIDE 0.9 % IR SOLN
Status: DC | PRN
  Administered 2020-01-15: 10000 mL

## 2020-01-15 MED ORDER — LACTATED RINGERS IV SOLN: INTRAVENOUS | Status: DC

## 2020-01-15 MED ORDER — ROCURONIUM BROMIDE 100 MG/10ML IV SOLN
INTRAVENOUS | Status: DC | PRN
  Administered 2020-01-15: 40 mg via INTRAVENOUS

## 2020-01-15 MED ORDER — ONDANSETRON HCL 4 MG/2ML IJ SOLN
INTRAMUSCULAR | Status: AC
  Filled 2020-01-15: qty 2

## 2020-01-15 MED ORDER — BUPIVACAINE HCL (PF) 0.25 % IJ SOLN
INTRAMUSCULAR | Status: DC | PRN
  Administered 2020-01-15: 10 mL via INTRA_ARTICULAR

## 2020-01-15 MED ORDER — DEXMEDETOMIDINE (PRECEDEX) IN NS 20 MCG/5ML (4 MCG/ML) IV SYRINGE
PREFILLED_SYRINGE | INTRAVENOUS | Status: DC | PRN
  Administered 2020-01-15: 4 ug via INTRAVENOUS

## 2020-01-15 MED ORDER — DEXAMETHASONE SODIUM PHOSPHATE 10 MG/ML IJ SOLN
INTRAMUSCULAR | Status: AC
  Filled 2020-01-15: qty 1

## 2020-01-15 MED ORDER — CEFAZOLIN SODIUM-DEXTROSE 2-4 GM/100ML-% IV SOLN
INTRAVENOUS | Status: AC
  Filled 2020-01-15: qty 100

## 2020-01-15 MED ORDER — PROPOFOL 10 MG/ML IV BOLUS
INTRAVENOUS | Status: AC
  Filled 2020-01-15: qty 20

## 2020-01-15 SURGICAL SUPPLY — 65 items
BLADE EXCALIBUR 4.0X13 (MISCELLANEOUS) ×2 IMPLANT
BURR OVAL 8 FLU 4.0X13 (MISCELLANEOUS) ×2 IMPLANT
CANNULA 5.75X71 LONG (CANNULA) IMPLANT
CANNULA PASSPORT 5 (CANNULA) ×2 IMPLANT
CANNULA PASSPORT BUTTON 10-40 (CANNULA) IMPLANT
CANNULA TWIST IN 8.25X7CM (CANNULA) IMPLANT
CHLORAPREP W/TINT 26 (MISCELLANEOUS) IMPLANT
CLSR STERI-STRIP ANTIMIC 1/2X4 (GAUZE/BANDAGES/DRESSINGS) ×2 IMPLANT
COVER WAND RF STERILE (DRAPES) IMPLANT
DECANTER SPIKE VIAL GLASS SM (MISCELLANEOUS) IMPLANT
DISSECTOR 3.5MM X 13CM CVD (MISCELLANEOUS) IMPLANT
DISSECTOR 4.0MMX13CM CVD (MISCELLANEOUS) IMPLANT
DRAPE IMP U-DRAPE 54X76 (DRAPES) ×2 IMPLANT
DRAPE INCISE IOBAN 66X45 STRL (DRAPES) IMPLANT
DRAPE SHOULDER BEACH CHAIR (DRAPES) ×2 IMPLANT
DRSG PAD ABDOMINAL 8X10 ST (GAUZE/BANDAGES/DRESSINGS) ×2 IMPLANT
DURAPREP 26ML APPLICATOR (WOUND CARE) ×2 IMPLANT
DW OUTFLOW CASSETTE/TUBE SET (MISCELLANEOUS) ×2 IMPLANT
GAUZE SPONGE 4X4 12PLY STRL (GAUZE/BANDAGES/DRESSINGS) ×2 IMPLANT
GAUZE XEROFORM 1X8 LF (GAUZE/BANDAGES/DRESSINGS) IMPLANT
GLOVE BIO SURGEON STRL SZ 6.5 (GLOVE) ×2 IMPLANT
GLOVE BIOGEL PI IND STRL 6.5 (GLOVE) ×1 IMPLANT
GLOVE BIOGEL PI IND STRL 7.0 (GLOVE) ×2 IMPLANT
GLOVE BIOGEL PI IND STRL 8 (GLOVE) ×1 IMPLANT
GLOVE BIOGEL PI INDICATOR 6.5 (GLOVE) ×1
GLOVE BIOGEL PI INDICATOR 7.0 (GLOVE) ×2
GLOVE BIOGEL PI INDICATOR 8 (GLOVE) ×1
GLOVE ECLIPSE 6.5 STRL STRAW (GLOVE) ×2 IMPLANT
GLOVE ECLIPSE 8.0 STRL XLNG CF (GLOVE) ×2 IMPLANT
GOWN STRL REUS W/ TWL LRG LVL3 (GOWN DISPOSABLE) ×2 IMPLANT
GOWN STRL REUS W/TWL LRG LVL3 (GOWN DISPOSABLE) ×2
GOWN STRL REUS W/TWL XL LVL3 (GOWN DISPOSABLE) ×2 IMPLANT
IMPL SPEEDBRIDGE KIT (Orthopedic Implant) ×1 IMPLANT
IMPLANT SPEEDBRIDGE KIT (Orthopedic Implant) ×2 IMPLANT
IV NS IRRIG 3000ML ARTHROMATIC (IV SOLUTION) ×8 IMPLANT
KIT STABILIZATION SHOULDER (MISCELLANEOUS) ×2 IMPLANT
KIT STR SPEAR 1.8 FBRTK DISP (KITS) IMPLANT
LASSO 90 CVE QUICKPAS (DISPOSABLE) IMPLANT
LASSO CRESCENT QUICKPASS (SUTURE) IMPLANT
MANIFOLD NEPTUNE II (INSTRUMENTS) ×2 IMPLANT
NDL SAFETY ECLIPSE 18X1.5 (NEEDLE) ×1 IMPLANT
NEEDLE HYPO 18GX1.5 SHARP (NEEDLE) ×1
NEEDLE SCORPION MULTI FIRE (NEEDLE) IMPLANT
PACK ARTHROSCOPY DSU (CUSTOM PROCEDURE TRAY) ×2 IMPLANT
PACK BASIN DAY SURGERY FS (CUSTOM PROCEDURE TRAY) ×2 IMPLANT
PAD ORTHO SHOULDER 7X19 LRG (SOFTGOODS) IMPLANT
PORT APPOLLO RF 90DEGREE MULTI (SURGICAL WAND) ×2 IMPLANT
RESTRAINT HEAD UNIVERSAL NS (MISCELLANEOUS) ×2 IMPLANT
SHEET MEDIUM DRAPE 40X70 STRL (DRAPES) ×2 IMPLANT
SLEEVE SCD COMPRESS KNEE MED (MISCELLANEOUS) ×2 IMPLANT
SLING ARM FOAM STRAP LRG (SOFTGOODS) IMPLANT
SLING ARM IMMOBILIZER XL (CAST SUPPLIES) ×2 IMPLANT
SUT FIBERWIRE #2 38 T-5 BLUE (SUTURE)
SUT MNCRL AB 4-0 PS2 18 (SUTURE) ×2 IMPLANT
SUT PDS AB 1 CT  36 (SUTURE) ×1
SUT PDS AB 1 CT 36 (SUTURE) ×1 IMPLANT
SUT TIGER TAPE 7 IN WHITE (SUTURE) IMPLANT
SUTURE FIBERWR #2 38 T-5 BLUE (SUTURE) IMPLANT
SUTURE TAPE TIGERLINK 1.3MM BL (SUTURE) IMPLANT
SUTURETAPE TIGERLINK 1.3MM BL (SUTURE)
SYR 5ML LL (SYRINGE) ×2 IMPLANT
TAPE FIBER 2MM 7IN #2 BLUE (SUTURE) IMPLANT
TOWEL GREEN STERILE FF (TOWEL DISPOSABLE) ×4 IMPLANT
TUBE CONNECTING 20X1/4 (TUBING) ×2 IMPLANT
TUBING ARTHROSCOPY IRRIG 16FT (MISCELLANEOUS) ×2 IMPLANT

## 2020-01-15 NOTE — Addendum Note (Signed)
Addendum  created 01/15/20 1206 by Belinda Block, MD   Order list changed

## 2020-01-15 NOTE — Discharge Instructions (Signed)
*  You had oxycodone 5mg  at 12:10pm  Post Anesthesia Home Care Instructions  Activity: Get plenty of rest for the remainder of the day. A responsible individual must stay with you for 24 hours following the procedure.  For the next 24 hours, DO NOT: -Drive a car -Paediatric nurse -Drink alcoholic beverages -Take any medication unless instructed by your physician -Make any legal decisions or sign important papers.  Meals: Start with liquid foods such as gelatin or soup. Progress to regular foods as tolerated. Avoid greasy, spicy, heavy foods. If nausea and/or vomiting occur, drink only clear liquids until the nausea and/or vomiting subsides. Call your physician if vomiting continues.  Special Instructions/Symptoms: Your throat may feel dry or sore from the anesthesia or the breathing tube placed in your throat during surgery. If this causes discomfort, gargle with warm salt water. The discomfort should disappear within 24 hours.  If you had a scopolamine patch placed behind your ear for the management of post- operative nausea and/or vomiting:  1. The medication in the patch is effective for 72 hours, after which it should be removed.  Wrap patch in a tissue and discard in the trash. Wash hands thoroughly with soap and water. 2. You may remove the patch earlier than 72 hours if you experience unpleasant side effects which may include dry mouth, dizziness or visual disturbances. 3. Avoid touching the patch. Wash your hands with soap and water after contact with the patch.     Regional Anesthesia Blocks  1. Numbness or the inability to move the "blocked" extremity may last from 3-48 hours after placement. The length of time depends on the medication injected and your individual response to the medication. If the numbness is not going away after 48 hours, call your surgeon.  2. The extremity that is blocked will need to be protected until the numbness is gone and the  Strength has returned.  Because you cannot feel it, you will need to take extra care to avoid injury. Because it may be weak, you may have difficulty moving it or using it. You may not know what position it is in without looking at it while the block is in effect.  3. For blocks in the legs and feet, returning to weight bearing and walking needs to be done carefully. You will need to wait until the numbness is entirely gone and the strength has returned. You should be able to move your leg and foot normally before you try and bear weight or walk. You will need someone to be with you when you first try to ensure you do not fall and possibly risk injury.  4. Bruising and tenderness at the needle site are common side effects and will resolve in a few days.  5. Persistent numbness or new problems with movement should be communicated to the surgeon or the [place] [phone] [place] [phone]).

## 2020-01-15 NOTE — Op Note (Signed)
Orthopaedic Surgery Operative Note (CSN: [id number])  Torunn Chancellor Olivares  Mar 21, 1971 Date of Surgery: 01/15/2020   Diagnoses:  LEFT SHOULDER PRIMARY OSTEOARTHRITIS BURSITIS, ROTATOR CUFF TEAR  Procedure: Arthroscopic extensive debridement Arthroscopic subacromial decompression Arthroscopic rotator cuff repair Arthroscopic distal clavicle excision   Operative Finding Exam under anesthesia: Full motion no limitation no instability Articular space: No loose bodies, significant anterior and superior labral fraying with some posterior fraying all of which was debrided back to a stable base Chondral surfaces:Intact, no sign of chondral degeneration on the glenoid or humeral head Biceps: Type II SLAP tear tenotomy performed Subscapularis: Mild fraying of the superior border but the anchor was intact. Superior Cuff: Complete supra and infra tear with retraction to the mid humeral head Bursal side: As above with poor tissue quality  Successful completion of the planned procedure. Patient's cuff was relatively poor quality and we worry about retear. Delayed therapy.   Post-operative plan: The patient will be non-weightbearing in a sling for 6 weeks with therapy to start after 6 weeks.  The patient will be discharged home.  DVT prophylaxis not indicated in ambulatory upper extremity patient without known risk factors.   Pain control with PRN pain medication preferring oral medicines.  Follow up plan will be scheduled in approximately 7 days for incision check and XR.  Post-Op Diagnosis: Same Surgeons:Primary: Hiram Gash, MD Assistants:Caroline McBane PA-C Location: [facility] OR ROOM 6 Anesthesia: General with Exparel interscalene block Antibiotics: Ancef 3 g Tourniquet time: None Estimated Blood Loss: Minimal Complications: None Specimens: None Implants: Implant Name Type Inv. Item Serial No. Manufacturer Lot No. LRB No. Used Action  IMPLANT SPEEDBRIDGE KIT - [id number] Orthopedic Implant IMPLANT  SPEEDBRIDGE KIT  [place] [id number] Left 1 Implanted    Indications for Surgey:   KAWANA HEGEL is a 49 y.o. female with continued shoulder pain refractory to nonoperative measures for extended period of time.    The risks and benefits were explained at length including but not limited to continued pain, cuff failure, biceps tenodesis failure, stiffness, need for further surgery and infection.   Procedure:   Patient was correctly identified in the preoperative holding area and operative site marked.  Patient brought to OR and positioned beachchair on an [place] table ensuring that all bony prominences were padded and the head was in an appropriate location.  Anesthesia was induced and the operative shoulder was prepped and draped in the usual sterile fashion.  Timeout was called preincision.  A standard posterior viewing portal was made after localizing the portal with a spinal needle.  An anterior accessory portal was also made.  After clearing the articular space the camera was positioned in the subacromial space.  Findings above.    Extensive debridement was performed of the anterior interval tissue, labral fraying and the bursa. We performed a biceps tenotomy and was debrided back on the stump to a stable base.  Subacromial decompression: We made a lateral portal with spinal needle guidance. We then proceeded to debride bursal tissue extensively with a shaver and arthrocare device. At that point we continued to identify the borders of the acromion and identify the spur. We then carefully preserved the deltoid fascia and used a burr to convert the acromion to a Type 1 flat acromion without issue.  Arthroscopic Rotator Cuff Repair: Tuberosity was prepared with a burr to a bleeding bed.  Following completion of the above we placed 2 4.7 Swivelock anchor loaded with a tape at inserted at the medial articular  margin and an scorpion suture passing device, shuttled  sutures medially in a horizontal  mattress suture configuration.  We then tied using arthroscopic knot tying techniques  each suture to its partner reducing the tendon at the prepared insertion site.  The fiber tape was not tied. With a medial row suture limbs then incorporated, 2 anteriorly and  2 posteriorly, into each of two 4.75 PEEK SwiveLock anchors, each placed 8 to 10 mm below the tip of the tuberosity and spanning anterior-posterior width of the tear with care to avoid over tensioning.   Distal Clavicle resection:  The scope was placed in the subacromial space from the posterior portal.  A hemostat was placed through the anterior portal and we spread at the [facility] joint.  A burr was then inserted and 10 mm of distal clavicle was resected taking care to avoid damage to the capsule around the joint and avoiding overhanging bone posteriorly.     The incisions were closed with absorbable monocryl and steri strips.  A sterile dressing was placed along with a sling. The patient was awoken from general anesthesia and taken to the PACU in stable condition without complication.   Noemi Chapel, PA-C, present and scrubbed throughout the case, critical for completion in a timely fashion, and for retraction, instrumentation, closure.

## 2020-01-15 NOTE — Anesthesia Procedure Notes (Signed)
Anesthesia Regional Block: Interscalene brachial plexus block   Pre-Anesthetic Checklist: ,, timeout performed, Correct Patient, Correct Site, Correct Laterality, Correct Procedure, Correct Position, site marked, Risks and benefits discussed,  Surgical consent,  Pre-op evaluation,  At surgeon's request and post-op pain management  Laterality: Left  Prep: chloraprep       Needles:  Injection technique: Single-shot  Needle Type: Stimulator Needle - 40          Additional Needles:   Procedures: Doppler guided, nerve stimulator,,, ultrasound used (permanent image in chart),,,,   Nerve Stimulator or Paresthesia:  Response: 0.5 mA,   Additional Responses:   Narrative:  Start time: 01/15/2020 8:55 AM End time: 01/15/2020 9:05 AM Injection made incrementally with aspirations every 5 mL.  Performed by: Personally  Anesthesiologist: Belinda Block, MD

## 2020-01-15 NOTE — Anesthesia Procedure Notes (Signed)
Procedure Name: Intubation Date/Time: 01/15/2020 9:13 AM Performed by: Glory Buff, CRNA Pre-anesthesia Checklist: Patient identified, Emergency Drugs available, Suction available and Patient being monitored Patient Re-evaluated:Patient Re-evaluated prior to induction Oxygen Delivery Method: Circle system utilized Preoxygenation: Pre-oxygenation with 100% oxygen Induction Type: IV induction Ventilation: Mask ventilation without difficulty Laryngoscope Size: Miller and 3 Grade View: Grade II Tube type: Oral Tube size: 7.0 mm Number of attempts: 1 Airway Equipment and Method: Stylet and Oral airway Placement Confirmation: ETT inserted through vocal cords under direct vision,  positive ETCO2 and breath sounds checked- equal and bilateral Secured at: 20 cm Tube secured with: Tape Dental Injury: Teeth and Oropharynx as per pre-operative assessment

## 2020-01-15 NOTE — Anesthesia Preprocedure Evaluation (Addendum)
Anesthesia Evaluation  Patient identified by MRN, date of birth, ID band Patient awake    Reviewed: Allergy & Precautions, Patient's Chart, lab work & pertinent test results  Airway Mallampati: II  TM Distance: >3 FB     Dental   Pulmonary Current Smoker,    breath sounds clear to auscultation       Cardiovascular hypertension,  Rhythm:Regular Rate:Normal     Neuro/Psych  Headaches, Seizures -,  PSYCHIATRIC DISORDERS Anxiety Depression  Neuromuscular disease CVA    GI/Hepatic Neg liver ROS, GERD  ,  Endo/Other    Renal/GU Renal disease     Musculoskeletal  (+) Arthritis , Fibromyalgia -  Abdominal   Peds  Hematology   Anesthesia Other Findings   Reproductive/Obstetrics                             Anesthesia Physical Anesthesia Plan  ASA: III  Anesthesia Plan: General   Post-op Pain Management:  Regional for Post-op pain   Induction: Intravenous  PONV Risk Score and Plan: 2 and Ondansetron, Dexamethasone and Midazolam  Airway Management Planned: Oral ETT  Additional Equipment:   Intra-op Plan:   Post-operative Plan: Extubation in OR  Informed Consent: I have reviewed the patients History and Physical, chart, labs and discussed the procedure including the risks, benefits and alternatives for the proposed anesthesia with the patient or authorized representative who has indicated his/her understanding and acceptance.     Dental advisory given  Plan Discussed with: CRNA, Anesthesiologist and Surgeon  Anesthesia Plan Comments:         Anesthesia Quick Evaluation

## 2020-01-15 NOTE — Interval H&P Note (Signed)
History and Physical Interval Note:  01/15/2020 8:44 AM  Ashley Gomez  has presented today for surgery, with the diagnosis of LEFT SHOULDER PRIMARY OSTEOARTHRITIS BURSITIS, ROTATOR CUFF TEAR.  The various methods of treatment have been discussed with the patient and family. After consideration of risks, benefits and other options for treatment, the patient has consented to  Procedure(s): SHOULDER ARTHROSCOPY DEBRIDMENT WITH ROTATOR CUFF REPAIR, SUBACROMIAL DECOMPRESSION WITH ACROMIPLASTY, AND BICEP TENODESIS (Left) SHOULDER ARTHROSCOPY WITH DISTAL CLAVICULECTOMY (Left) as a surgical intervention.  The patient's history has been reviewed, patient examined, no change in status, stable for surgery.  I have reviewed the patient's chart and labs.  Questions were answered to the patient's satisfaction.     Hiram Gash

## 2020-01-15 NOTE — Transfer of Care (Signed)
Immediate Anesthesia Transfer of Care Note  Patient: Ashley Gomez  Procedure(s) Performed: SHOULDER ARTHROSCOPY DEBRIDMENT WITH ROTATOR CUFF REPAIR, SUBACROMIAL DECOMPRESSION WITH ACROMIPLASTY, AND BICEP TENOTOMY (Left Shoulder) SHOULDER ARTHROSCOPY WITH DISTAL CLAVICULECTOMY (Left Shoulder) SHOULDER ARTHROSCOPY WITH BICEPSTENOTOMY (Left Shoulder)  Patient Location: PACU  Anesthesia Type:General and Regional  Level of Consciousness: drowsy, patient cooperative and responds to stimulation  Airway & Oxygen Therapy: Patient Spontanous Breathing and Patient connected to face mask oxygen  Post-op Assessment: Report given to RN and Post -op Vital signs reviewed and stable  Post vital signs: Reviewed and stable  Last Vitals:  Vitals Value Taken Time  BP    Temp    Pulse 79 01/15/20 1042  Resp 22 01/15/20 1042  SpO2 94 % 01/15/20 1042  Vitals shown include unvalidated device data.  Last Pain:  Vitals:   01/15/20 0819  TempSrc: Oral  PainSc: 7       Patients Stated Pain Goal: 5 (75/91/63 8466)  Complications: No complications documented.

## 2020-01-15 NOTE — Progress Notes (Signed)
Assisted Dr. Nyoka Cowden with left, ultrasound guided, interscalene  block. Side rails up, monitors on throughout procedure. See vital signs in flow sheet. Tolerated Procedure well.

## 2020-01-15 NOTE — Anesthesia Postprocedure Evaluation (Signed)
Anesthesia Post Note  Patient: Ashley Gomez  Procedure(s) Performed: SHOULDER ARTHROSCOPY DEBRIDMENT WITH ROTATOR CUFF REPAIR, SUBACROMIAL DECOMPRESSION WITH ACROMIPLASTY, AND BICEP TENOTOMY (Left Shoulder) SHOULDER ARTHROSCOPY WITH DISTAL CLAVICULECTOMY (Left Shoulder) SHOULDER ARTHROSCOPY WITH BICEPSTENOTOMY (Left Shoulder)     Patient location during evaluation: PACU Anesthesia Type: General Level of consciousness: awake Pain management: pain level controlled Vital Signs Assessment: post-procedure vital signs reviewed and stable Respiratory status: spontaneous breathing Cardiovascular status: stable Postop Assessment: no apparent nausea or vomiting Anesthetic complications: no   No complications documented.  Last Vitals:  Vitals:   01/15/20 1045 01/15/20 1100  BP: 134/81   Pulse: 83 77  Resp: 15 (!) 21  Temp: 36.8 C   SpO2: 96% 100%    Last Pain:  Vitals:   01/15/20 1045  TempSrc:   PainSc: 0-No pain                 Gaile Allmon

## 2020-01-16 ENCOUNTER — Encounter (HOSPITAL_BASED_OUTPATIENT_CLINIC_OR_DEPARTMENT_OTHER): Payer: Self-pay | Admitting: Orthopaedic Surgery

## 2020-01-22 DIAGNOSIS — M19012 Primary osteoarthritis, left shoulder: Secondary | ICD-10-CM | POA: Diagnosis not present

## 2020-01-28 ENCOUNTER — Telehealth (INDEPENDENT_AMBULATORY_CARE_PROVIDER_SITE_OTHER): Payer: Medicare Other | Admitting: Psychiatry

## 2020-01-28 ENCOUNTER — Encounter (HOSPITAL_COMMUNITY): Payer: Self-pay | Admitting: Psychiatry

## 2020-01-28 DIAGNOSIS — F331 Major depressive disorder, recurrent, moderate: Secondary | ICD-10-CM | POA: Diagnosis not present

## 2020-01-28 DIAGNOSIS — F4321 Adjustment disorder with depressed mood: Secondary | ICD-10-CM

## 2020-01-28 DIAGNOSIS — F411 Generalized anxiety disorder: Secondary | ICD-10-CM | POA: Diagnosis not present

## 2020-01-28 DIAGNOSIS — F4329 Adjustment disorder with other symptoms: Secondary | ICD-10-CM | POA: Diagnosis not present

## 2020-01-28 MED ORDER — DULOXETINE HCL 30 MG PO CPEP
ORAL_CAPSULE | ORAL | 2 refills | Status: DC
Start: 2020-01-28 — End: 2020-04-29

## 2020-01-28 MED ORDER — DULOXETINE HCL 60 MG PO CPEP: ORAL_CAPSULE | ORAL | 2 refills | Status: DC
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
## 2020-02-09 ENCOUNTER — Ambulatory Visit (INDEPENDENT_AMBULATORY_CARE_PROVIDER_SITE_OTHER): Payer: Medicare Other | Admitting: Licensed Clinical Social Worker

## 2020-02-09 DIAGNOSIS — F4321 Adjustment disorder with depressed mood: Secondary | ICD-10-CM

## 2020-02-09 DIAGNOSIS — F4310 Post-traumatic stress disorder, unspecified: Secondary | ICD-10-CM | POA: Diagnosis not present

## 2020-02-09 DIAGNOSIS — F4329 Adjustment disorder with other symptoms: Secondary | ICD-10-CM

## 2020-02-09 DIAGNOSIS — F411 Generalized anxiety disorder: Secondary | ICD-10-CM

## 2020-02-09 DIAGNOSIS — F331 Major depressive disorder, recurrent, moderate: Secondary | ICD-10-CM | POA: Diagnosis not present

## 2020-02-09 DIAGNOSIS — G894 Chronic pain syndrome: Secondary | ICD-10-CM

## 2020-02-09 NOTE — Progress Notes (Signed)
Virtual Visit via Telephone Note  Therapist-home office Patient-home I connected with Ashley Gomez on 02/09/20 at  3:00 PM EDT by telephone and verified that I am speaking with the correct person using two identifiers.   I discussed the limitations, risks, security and privacy concerns of performing an evaluation and management service by telephone and the availability of in person appointments. I also discussed with the patient that there may be a patient responsible charge related to this service. The patient expressed understanding and agreed to proceed.   I discussed the assessment and treatment plan with the patient. The patient was provided an opportunity to ask questions and all were answered. The patient agreed with the plan and demonstrated an understanding of the instructions.   The patient was advised to call back or seek an in-person evaluation if the symptoms worsen or if the condition fails to improve as anticipated.  I provided 54 minutes of non-face-to-face time during this encounter.   THERAPIST PROGRESS NOTE  Session Time: 3:00 PM to 3:54 PM  Participation Level: Active  Behavioral Response: CasualAlertDysphoric and Irritable  Type of Therapy: Individual Therapy  Treatment Goals addressed:  supportive interventions continue to be significant intervention for patient and continue to work on management decreasing depression, anxiety, stress management, trauma, grief, coping Interventions: Solution Focused, Strength-based, Supportive, Reframing and Other: coping  Summary: Ashley Gomez is a 49 y.o. female who presents with surgery on September 2 on shoulder. It is painful. Had to do more than thought when got in. Thought just to fix rotator cuff, but was more including collar bone, bone spur, and told rotator cuff would tear easily again because tissue so thin. Arm in sling and can't start physical therapy until 6 weeks after surgery. Hopefully come out of sling  hard  to sleep with it. Having muscle spasms. Had to go to surgeon a week after and said pain down the arm is muscle spasms and patient describes the pain "like somebody hitting her". Was told it is normal after that surgery after they put the nerve block in. All the way down to elbow bothering her. On pain medicine. Was worried about blood clot since feet, ankle swollen but found out was from a medicine taking a couple of years. Celebrex. If not better to call doctor. After two days the swelling went down so it was the cause and doesn't have to worry about blood clot. Definitely differentiate shoulder and arm pain from other pain. Feels worse. Favoring it. Back and neck issues. "It is the worst thing" Myofascial pain that causes pain all over. Sees a pain specialist. Day after surgery said couldn't handle it hurt so bad, called something it to go with pain medicine. Therapist pointed out that this session could mean better since surgery since it was the beginning of the month and patient says it is. Stay tired. Get around the same can't do a lot with one hand. Try to do everything. Son there and help, [name] there and helping. She still has her attitude. Not going to argue with her. "She is 8 going on 28." Thinks it could be hormones and attitude getting a little better. Ashley Gomez is doing better. He still doesn't want to be in house because of everything that happened there. Going to take a plane to [place] and stay with friend and have a job he can do. In head good for him but heart don't want him to be so far away. Had his kids up  to her surgery. Hopefully will see them once arm gets better. They are talking four months before can lift 2 lbs. If help can come before that. They are doing ok don't want them to forget them why come and get them. Discuss exercise for patient has bursitis in hip but feels like it is going to give out. Can walk for about 15 minutes. Once hip starts then lower back starts one reaction then  another. Discussed status of MD. Diagnosed 20 years ago because told she has legions, scars on brain can cause MS. But also can cause migraines and patient has that. Told she has type of MS called remissive MS where it flares up. Can tell something going on body, fall, drop things peripheral vision goes, tingle feeling in head, hands and arms and feet, pain on skin on knee on down and shares it feels as if you take a piece of metal and running it down the bone. So many different things. Worse flare up was 10 years ago. Feet turned in a figure of C and laying in bed screaming and crying. 10 minutes went away. Didn't have that again. Feels it is like a cramp but body goes a different way. Was on Tecsidera but no medicine take away remission and take away pain. $27,000 for a supply for a month. Not really helping and cost too much money. Not good rapport for neurologist 2nd one had. Has had tons of tests like nerve conduction tests and they said MS. Wanted to know one neurologist say one thing has MS and why fall, why hurt, feet and hand and feet got numb, losing peripheral vision. Neurologist who she has seen since back in [place] said MRI not consistent with MS. Told her another MRI. Stress makes it worse. In [place] said had MS. In [place] kept on medication. When said moving to [place] need to get neurologist there. Neurologist said nothing wrong with her. Patient asked why told she has it. Therapist pointed out things seemed stalled, MRI didn't get rescheduled. Patient said not going to worry about until she needs to. Shared because not coming into office feels isolated because can't see people.    Suicidal/Homicidal: No  Therapist Response: Therapist reviewed symptoms, facilitated expression thoughts and feelings which was main intervention for patient as she deals with stress of recovering from surgery also discussing other medical issues that play a part in medical presentation.  Validated patient on how she  was feeling explored treatment history to help with problem solving through history recognizing frustration of finding out sources of issues and getting the right treatment.  Other stressors for patient include her son who will be going to [place] soon, granddaughter who can be oppositional.  Therapist utilize reframing to focus on having 7 grandchildren who she enjoys, help her not feel isolated.  Therapist provided strength based as well as active listening open questions supportive interventions  Plan: Return again in 5 weeks.(therapist on vacation and once return can set up regularly) 2.Therapist work with patient on stress management, anger management, coping  Diagnosis: Axis I: generalized anxiety disorder PTSD, complicated grief, chronic pain syndrome, Major depressive disorder, recurrent moderate    Axis II: No diagnosis    Cordella Register, LCSW 02/09/2020

## 2020-02-11 DIAGNOSIS — Z79899 Other long term (current) drug therapy: Secondary | ICD-10-CM | POA: Diagnosis not present

## 2020-02-11 DIAGNOSIS — G8929 Other chronic pain: Secondary | ICD-10-CM | POA: Diagnosis not present

## 2020-02-11 DIAGNOSIS — F17210 Nicotine dependence, cigarettes, uncomplicated: Secondary | ICD-10-CM | POA: Diagnosis not present

## 2020-02-11 DIAGNOSIS — M545 Low back pain: Secondary | ICD-10-CM | POA: Diagnosis not present

## 2020-02-26 DIAGNOSIS — M19012 Primary osteoarthritis, left shoulder: Secondary | ICD-10-CM | POA: Diagnosis not present

## 2020-02-27 ENCOUNTER — Other Ambulatory Visit: Payer: Self-pay

## 2020-02-27 ENCOUNTER — Ambulatory Visit (INDEPENDENT_AMBULATORY_CARE_PROVIDER_SITE_OTHER): Payer: Medicare Other | Admitting: Rehabilitative and Restorative Service Providers"

## 2020-02-27 ENCOUNTER — Encounter: Payer: Self-pay | Admitting: Rehabilitative and Restorative Service Providers"

## 2020-02-27 DIAGNOSIS — R293 Abnormal posture: Secondary | ICD-10-CM

## 2020-02-27 DIAGNOSIS — M25512 Pain in left shoulder: Secondary | ICD-10-CM | POA: Diagnosis not present

## 2020-02-27 DIAGNOSIS — R29898 Other symptoms and signs involving the musculoskeletal system: Secondary | ICD-10-CM | POA: Diagnosis not present

## 2020-02-27 NOTE — Therapy (Addendum)
[facility], [place], [address] Phone: [phone]   Fax:  [phone]  Physical Therapy Evaluation  Patient Details  Name: Ashley Gomez MRN: [id number] Date of Birth: [birth year] Referring Provider (PT): Dr Ophelia Charter    Encounter Date: 02/27/2020   PT End of Session - 02/27/20 0807    Visit Number 1    Number of Visits 24    Date for PT Re-Evaluation 05/07/20    PT Start Time 0805    Activity Tolerance Patient tolerated treatment well           Past Medical History:  Diagnosis Date  . Anxiety   . Arthritis   . CVA (cerebral infarction)    Old CVA on MRI brain  . Depression   . DVT (deep venous thrombosis) ([facility]) 27/07/5007   L basilic vein   . Facet hypertrophy of lumbar region    MRI 2007  . Fibromyalgia   . GERD (gastroesophageal reflux disease)   . Hypertension   . Migraines   . MS (multiple sclerosis) ([place])   . Neuromuscular disorder ([place])   . Obesity   . Pre-diabetes   . PTSD (post-traumatic stress disorder)   . Seizures ([place]) 2011   no seizure since onset  . Smoking   . Stroke [facility])    TIA, >8years ago    Past Surgical History:  Procedure Laterality Date  . CHOLECYSTECTOMY  10/2017  . CHONDROPLASTY Left 12/30/2014   Procedure: CHONDROPLASTY;  Surgeon: Leandrew Koyanagi, MD;  Location: [facility];  Service: Orthopedics;  Laterality: Left;  . KNEE ARTHROSCOPY Left 12/30/2014   Procedure: LEFT KNEE ARTHROSCOPY WITH   CHONDROPLASTY;  Surgeon: Leandrew Koyanagi, MD;  Location: [facility];  Service: Orthopedics;  Laterality: Left;  . PARTIAL KNEE ARTHROPLASTY Left 08/03/2016   Procedure: LEFT UNICOMPARTMENTAL KNEE ARTHROPLASTY;  Surgeon: Leandrew Koyanagi, MD;  Location: [facility];  Service: Orthopedics;  Laterality: Left;  . SHOULDER ARTHROSCOPY WITH BICEPSTENOTOMY Left 01/15/2020   Procedure: SHOULDER ARTHROSCOPY WITH BICEPSTENOTOMY;  Surgeon: Hiram Gash, MD;   Location: [facility];  Service: Orthopedics;  Laterality: Left;  . SHOULDER ARTHROSCOPY WITH DISTAL CLAVICLE RESECTION Left 01/15/2020   Procedure: SHOULDER ARTHROSCOPY WITH DISTAL CLAVICULECTOMY;  Surgeon: Hiram Gash, MD;  Location: [facility];  Service: Orthopedics;  Laterality: Left;  . SHOULDER ARTHROSCOPY WITH ROTATOR CUFF REPAIR AND SUBACROMIAL DECOMPRESSION Left 01/15/2020   Procedure: SHOULDER ARTHROSCOPY DEBRIDMENT WITH ROTATOR CUFF REPAIR, SUBACROMIAL DECOMPRESSION WITH ACROMIPLASTY, AND BICEP TENOTOMY;  Surgeon: Hiram Gash, MD;  Location: [facility];  Service: Orthopedics;  Laterality: Left;  . TONSILLECTOMY    . TUBAL LIGATION  1992    There were no vitals filed for this visit.    Subjective Assessment - 02/27/20 0813    Subjective Patient reports that she injured her Lt shoulder ~ 1 yr ago moving a king size mattress. Pain persisted throughlut the year and pt elected to undergo Lt RCR; DCR; SAD; debridement 01/15/20. She she now out of the sling. She has continued pain in the shoulder.    Pertinent History LBP; MS; migraines; seziures; skin cancer; Lt partial knee replacement    Patient Stated Goals need both arms - get the Lt arm working again    Currently in Pain? Yes    Pain Score 8     Pain Location Shoulder    Pain Orientation Left  Pain Descriptors / Indicators Spasm;Sharp    Pain Type Surgical pain;Chronic pain    Pain Radiating Towards into the neck    Pain Onset More than a month ago    Pain Frequency Intermittent    Aggravating Factors  movement    Pain Relieving Factors holding arm still              [facility] PT Assessment - 02/27/20 0001      Assessment   Medical Diagnosis Lt RCR; DCR; SAD; debridement     Referring Provider (PT) Dr Ophelia Charter     Onset Date/Surgical Date 01/15/20   injury ~ 1 yr ago    Hand Dominance Right    Next MD Visit 04/13/20    Prior Therapy 1 visit for shoulder 6/21       Precautions   Precautions Shoulder    Type of Shoulder Precautions post op precautions     Precaution Comments no lifting >2#       Balance Screen   Has the patient fallen in the past 6 months No    Has the patient had a decrease in activity level because of a fear of falling?  No    Is the patient reluctant to leave their home because of a fear of falling?  No      Home Social worker Private residence    Living Arrangements Other relatives;Non-relatives/Friends      Prior Function   Level of Independence Independent    Vocation On disability   for ~ 5 yrs - MS/depression/anxiety/PTSD    Vocation Requirements household chores; 3 dogs; 55 yr old granddaughter     Leisure sedentary       Observation/Other Assessments   Focus on Therapeutic Outcomes (FOTO)  80% limitation       Observation/Other Assessments-Edema    Edema --   persistent edema      Sensation   Additional Comments WFL's per pt report       Posture/Postural Control   Posture Comments head forward; shoulders rounded and elevated; scapulae abducted along the thoracic wall; head of the humerus anterior in orientation       AROM   Right Shoulder Extension 57 Degrees    Right Shoulder Flexion 145 Degrees    Right Shoulder ABduction 155 Degrees    Right Shoulder Internal Rotation --   thumb to T9   Right Shoulder External Rotation 90 Degrees    Cervical Flexion 54    Cervical Extension 50    Cervical - Right Side Bend 34 pulling    Cervical - Left Side Bend 38 pulling    Cervical - Right Rotation 68    Cervical - Left Rotation 70      PROM   Left Shoulder Flexion 43 Degrees   painful guarding    Left Shoulder ABduction 45 Degrees   painful guarding    Left Shoulder External Rotation 40 Degrees      Strength   Right Shoulder Flexion 5/5    Right Shoulder Extension 5/5    Right Shoulder ABduction 5/5    Right Shoulder Internal Rotation 5/5    Right Shoulder External Rotation 5/5       Palpation   Spinal mobility hypomobile thoracic spine with PA mobs painful mid thoracic     Palpation comment muscular tighness and pain Lt shoulder girdle - deltoid; pecs; upper trap; leveator; teres  Objective measurements completed on examination: See above findings.   Treatment included exercise instruction per HEP  Vaso - - shoulder x 10 min low pressure x 34 deg               PT Short Term Goals - 02/27/20 0845      PT SHORT TERM GOAL #1   Title Independent in initial HEP    Time 6    Period Weeks    Status New    Target Date 04/09/20      PT SHORT TERM GOAL #2   Title Increase AROM to at least 90 to 100 deg flexion and abduction    Time 6    Period Weeks    Status New    Target Date 04/09/20      PT SHORT TERM GOAL #3   Title Improve posterior shoulder girdle strength with patient to demonstrate improved thoracic extension and posture    Time 6    Period Weeks    Status New    Target Date 04/09/20             PT Long Term Goals - 02/27/20 0847      PT LONG TERM GOAL #1   Title Increase AROM Lt shoulder to 120-130 deg flex and abd; 70-80 deg ER to allow patient to return to functional activities using Lt UE    Time 12    Period Weeks    Status New    Target Date 05/07/20      PT LONG TERM GOAL #2   Title 4/5 to 4+/5 strength Lt UE    Time 12    Period Weeks    Status New    Target Date 05/07/20      PT LONG TERM GOAL #3   Title Decrease pain Lt shoulder by 50-75% allowing patient to use Lt UE for functional activities at prior level of function    Time 12    Period Weeks    Status New    Target Date 05/07/20      PT LONG TERM GOAL #4   Title Independent in HEP    Time 12    Period Weeks    Status New    Target Date 05/07/20      PT LONG TERM GOAL #5   Title Improve FOTO to </= 47% limitation    Time 6    Period Weeks    Status New    Target Date 05/07/20                  Plan -  02/27/20 0840    Clinical Impression Statement Patient presents s/p Lt RCR; DCR; SAD; debridement 01/15/20. She has persistent pain with all movement; muscule spasms; decreased ROM; decreased strength; abnormal posture; limited ADL's and functional activity tolerance. Patient will benefit from PT to address problems identified.    Stability/Clinical Decision Making Stable/Uncomplicated    Clinical Decision Making Low    Rehab Potential Good    PT Frequency 2x / week    PT Duration 12 weeks    PT Treatment/Interventions ADLs/Self Care Home Management;Aquatic Therapy;Cryotherapy;Electrical Stimulation;Iontophoresis 4mg /ml Dexamethasone;Moist Heat;Ultrasound;Functional mobility training;Therapeutic activities;Therapeutic exercise;Neuromuscular re-education;Patient/family education;Manual techniques;Passive range of motion;Dry needling;Taping;Vasopneumatic Device    PT Next Visit Plan review HEP; progress with pulley; PROM; stretching; manual work; modalities as indicated    Consulted and Agree with Plan of Care Patient           Patient will  benefit from skilled therapeutic intervention in order to improve the following deficits and impairments:  Decreased range of motion, Increased fascial restricitons, Increased muscle spasms, Impaired UE functional use, Obesity, Decreased activity tolerance, Pain, Impaired flexibility, Improper body mechanics, Decreased mobility, Decreased strength, Increased edema, Postural dysfunction  Visit Diagnosis: Acute pain of left shoulder - Plan: PT plan of care cert/re-cert  Abnormal posture - Plan: PT plan of care cert/re-cert  Other symptoms and signs involving the musculoskeletal system - Plan: PT plan of care cert/re-cert     Problem List Patient Active Problem List   Diagnosis Date Noted  . Squamous cell skin cancer 08/04/2019  . IFG (impaired fasting glucose) 08/04/2019  . Abnormal weight gain 11/27/2018  . Abdominal pain, chronic, epigastric  11/27/2018  . BMI 45.0-49.9, adult ([facility]) 10/04/2018  . Primary osteoarthritis of left hip 08/05/2018  . Low calcium levels 07/26/2018  . Family history of celiac disease 05/29/2018  . Precordial chest pain 03/06/2018  . Dyslipidemia 03/06/2018  . Adrenal mass ([place]) 01/23/2018  . GERD (gastroesophageal reflux disease) 09/20/2017  . Heavy tobacco smoker 09/20/2017  . Adenoma of left adrenal gland 09/11/2017  . Diverticulosis of colon 09/11/2017  . Left nephrolithiasis 09/11/2017  . Hepatic steatosis 09/07/2017  . Status post left partial knee replacement 08/03/2016  . Rotator cuff tear, left 07/06/2016  . Trochanteric bursitis of left hip 03/31/2015  . Dependent personality disorder ([place]) 12/03/2014  . Chronic pain syndrome 12/03/2014  . Myofascial pain 11/09/2014  . Dysthymic disorder 10/15/2014  . PTSD (post-traumatic stress disorder) 10/15/2014  . Acute pain of left knee 08/27/2014  . Primary osteoarthritis of both knees 12/21/2011  . B12 DEFICIENCY 06/21/2010  . Vitamin D deficiency 06/21/2010  . NECK PAIN, CHRONIC 06/21/2010  . VARICOSE VEINS, LOWER EXTREMITIES 05/17/2010  . Post-menopausal 05/17/2010  . NUMBNESS, ARM 04/04/2010  . History of DVT (deep vein thrombosis) 01/13/2010  . SEIZURE DISORDER 01/13/2010  . Depression, major, recurrent, moderate ([place]) 05/16/2009  . Morbid obesity ([place]) 03/09/2009  . Anxiety state 03/09/2009  . CIGARETTE SMOKER 03/09/2009  . MULTIPLE SCLEROSIS, RELAPSING/REMITTING 03/09/2009  . Migraine without aura 03/09/2009  . ESSENTIAL HYPERTENSION, BENIGN 03/09/2009  . Lumbar degenerative disc disease 03/09/2009    Lejend Dalby Nilda Simmer  PT, MPH  02/27/2020, 10:41 AM  [facility] [place], [place], [address] Phone: [phone]   Fax:  [phone]  Name: Ashley Gomez MRN: [id number] Date of Birth: 1970/09/10

## 2020-03-02 ENCOUNTER — Ambulatory Visit (INDEPENDENT_AMBULATORY_CARE_PROVIDER_SITE_OTHER): Payer: Medicare Other | Admitting: Physical Therapy

## 2020-03-02 ENCOUNTER — Other Ambulatory Visit: Payer: Self-pay

## 2020-03-02 DIAGNOSIS — R29898 Other symptoms and signs involving the musculoskeletal system: Secondary | ICD-10-CM | POA: Diagnosis not present

## 2020-03-02 DIAGNOSIS — M25512 Pain in left shoulder: Secondary | ICD-10-CM

## 2020-03-02 DIAGNOSIS — R293 Abnormal posture: Secondary | ICD-10-CM

## 2020-03-02 NOTE — Therapy (Signed)
[place], [place], [address] Phone: [phone]   Fax:  [phone]  Physical Therapy Treatment  Patient Details  Name: Ashley Gomez MRN: [id number] Date of Birth: [birth year] Referring Provider (PT): Dr Ophelia Charter    Encounter Date: 03/02/2020   PT End of Session - 03/02/20 0815    Visit Number 2    Number of Visits 24    Date for PT Re-Evaluation 05/07/20    PT Start Time 0803    PT Stop Time 0852    PT Time Calculation (min) 49 min    Activity Tolerance Patient tolerated treatment well           Past Medical History:  Diagnosis Date  . Anxiety   . Arthritis   . CVA (cerebral infarction)    Old CVA on MRI brain  . Depression   . DVT (deep venous thrombosis) ([place]) 69/67/8938   L basilic vein   . Facet hypertrophy of lumbar region    MRI 2007  . Fibromyalgia   . GERD (gastroesophageal reflux disease)   . Hypertension   . Migraines   . MS (multiple sclerosis) ([place])   . Neuromuscular disorder ([place])   . Obesity   . Pre-diabetes   . PTSD (post-traumatic stress disorder)   . Seizures ([place]) 2011   no seizure since onset  . Smoking   . Stroke [facility])    TIA, >8years ago    Past Surgical History:  Procedure Laterality Date  . CHOLECYSTECTOMY  10/2017  . CHONDROPLASTY Left 12/30/2014   Procedure: CHONDROPLASTY;  Surgeon: Leandrew Koyanagi, MD;  Location: [place] [facility];  Service: Orthopedics;  Laterality: Left;  . KNEE ARTHROSCOPY Left 12/30/2014   Procedure: LEFT KNEE ARTHROSCOPY WITH   CHONDROPLASTY;  Surgeon: Leandrew Koyanagi, MD;  Location: [place];  Service: Orthopedics;  Laterality: Left;  . PARTIAL KNEE ARTHROPLASTY Left 08/03/2016   Procedure: LEFT UNICOMPARTMENTAL KNEE ARTHROPLASTY;  Surgeon: Leandrew Koyanagi, MD;  Location: [facility];  Service: Orthopedics;  Laterality: Left;  . SHOULDER ARTHROSCOPY WITH BICEPSTENOTOMY Left 01/15/2020   Procedure: SHOULDER  ARTHROSCOPY WITH BICEPSTENOTOMY;  Surgeon: Hiram Gash, MD;  Location: [facility];  Service: Orthopedics;  Laterality: Left;  . SHOULDER ARTHROSCOPY WITH DISTAL CLAVICLE RESECTION Left 01/15/2020   Procedure: SHOULDER ARTHROSCOPY WITH DISTAL CLAVICULECTOMY;  Surgeon: Hiram Gash, MD;  Location: [facility];  Service: Orthopedics;  Laterality: Left;  . SHOULDER ARTHROSCOPY WITH ROTATOR CUFF REPAIR AND SUBACROMIAL DECOMPRESSION Left 01/15/2020   Procedure: SHOULDER ARTHROSCOPY DEBRIDMENT WITH ROTATOR CUFF REPAIR, SUBACROMIAL DECOMPRESSION WITH ACROMIPLASTY, AND BICEP TENOTOMY;  Surgeon: Hiram Gash, MD;  Location: [name];  Service: Orthopedics;  Laterality: Left;  . TONSILLECTOMY    . TUBAL LIGATION  1992    There were no vitals filed for this visit.   Subjective Assessment - 03/02/20 0815    Subjective Last week after eval, "I felt awesome". Pt reports using Lt arm to vacuum and clean carpets. Since then pain in Lt ant shoulder has been high.    Currently in Pain? Yes    Pain Score 7     Pain Location Shoulder    Pain Orientation Left    Pain Descriptors / Indicators Spasm;Sharp    Aggravating Factors  movement of Lt arm    Pain Relieving Factors medicine, holding arm still  [facility] PT Assessment - 03/02/20 0001      Assessment   Medical Diagnosis Lt RCR; DCR; SAD; debridement     Referring Provider (PT) Dr Ophelia Charter     Onset Date/Surgical Date 01/15/20   injury ~ 1 yr ago    Hand Dominance Right    Next MD Visit 04/13/20    Prior Therapy 1 visit for shoulder 6/21      PROM   Left Shoulder External Rotation 40 Degrees            [facility] Adult PT Treatment/Exercise - 03/02/20 0001      Shoulder Exercises: Supine   External Rotation AAROM;Left;10 reps   cane   Flexion AAROM;Both;5 reps   limited ability with cane; unable to lift cane     Shoulder Exercises: Seated   Other Seated Exercises shoulder rolls x 10  reps backwards;  scap squeeze x 5 sec x 8 reps       Shoulder Exercises: Standing   Other Standing Exercises Pendulum with LUE x 10 CW/CCW x 2 sets      Shoulder Exercises: Pulleys   Flexion --   5 reps; 5 sec hold   Flexion Limitations limited tolerance; guarding       Shoulder Exercises: Isometric Strengthening   External Rotation 5X5"   gentle contraction; not added to HEP yet.    Internal Rotation 5X5"   gentle contraction; not added to HEP yet.      Shoulder Exercises: Stretch   Table Stretch -Flexion Limitations verbally reviewed     Other Shoulder Stretches Lt bicep stretch holding counter and stepping forward x 20 sec x 3 reps (to tolerance)      Modalities   Modalities Vasopneumatic      Vasopneumatic   Number Minutes Vasopneumatic  10 minutes    Vasopnuematic Location  Shoulder    Vasopneumatic Pressure Low    Vasopneumatic Temperature  34 deg       Manual Therapy   Manual Therapy Passive ROM    Manual therapy comments I strip of sensitive skin rock tape applied with 10% stretch over ant Lt shoulder     Passive ROM Lt shoulder flexion, scaption, ER, ext to tissue limits and no pain.                     PT Short Term Goals - 02/27/20 0845      PT SHORT TERM GOAL #1   Title Independent in initial HEP    Time 6    Period Weeks    Status New    Target Date 04/09/20      PT SHORT TERM GOAL #2   Title Increase AROM to at least 90 to 100 deg flexion and abduction    Time 6    Period Weeks    Status New    Target Date 04/09/20      PT SHORT TERM GOAL #3   Title Improve posterior shoulder girdle strength with patient to demonstrate improved thoracic extension and posture    Time 6    Period Weeks    Status New    Target Date 04/09/20             PT Long Term Goals - 02/27/20 0847      PT LONG TERM GOAL #1   Title Increase AROM Lt shoulder to 120-130 deg flex and abd; 70-80 deg ER to allow patient to return to functional activities using Lt  UE     Time 12    Period Weeks    Status New    Target Date 05/07/20      PT LONG TERM GOAL #2   Title 4/5 to 4+/5 strength Lt UE    Time 12    Period Weeks    Status New    Target Date 05/07/20      PT LONG TERM GOAL #3   Title Decrease pain Lt shoulder by 50-75% allowing patient to use Lt UE for functional activities at prior level of function    Time 12    Period Weeks    Status New    Target Date 05/07/20      PT LONG TERM GOAL #4   Title Independent in HEP    Time 12    Period Weeks    Status New    Target Date 05/07/20      PT LONG TERM GOAL #5   Title Improve FOTO to </= 47% limitation    Time 6    Period Weeks    Status New    Target Date 05/07/20                 Plan - 03/02/20 1300    Clinical Impression Statement Pt presents with flare up of symptoms since using arm after last session.  Pt guarded with PROM attempts and painful with AAROM.  She tolerated gentle bicep stretch well.  Pt reports relief of some syptoms with use of vaso at end of session.    Stability/Clinical Decision Making Stable/Uncomplicated    Rehab Potential Good    PT Frequency 2x / week    PT Duration 12 weeks    PT Treatment/Interventions ADLs/Self Care Home Management;Aquatic Therapy;Cryotherapy;Electrical Stimulation;Iontophoresis 4mg /ml Dexamethasone;Moist Heat;Ultrasound;Functional mobility training;Therapeutic activities;Therapeutic exercise;Neuromuscular re-education;Patient/family education;Manual techniques;Passive range of motion;Dry needling;Taping;Vasopneumatic Device    PT Next Visit Plan review HEP; progress with pulley; PROM; stretching; manual work; modalities as indicated    Oncologist with Plan of Care Patient           Patient will benefit from skilled therapeutic intervention in order to improve the following deficits and impairments:  Decreased range of motion, Increased fascial restricitons, Increased muscle spasms, Impaired UE functional use, Obesity,  Decreased activity tolerance, Pain, Impaired flexibility, Improper body mechanics, Decreased mobility, Decreased strength, Increased edema, Postural dysfunction  Visit Diagnosis: Acute pain of left shoulder  Abnormal posture  Other symptoms and signs involving the musculoskeletal system     Problem List Patient Active Problem List   Diagnosis Date Noted  . Squamous cell skin cancer 08/04/2019  . IFG (impaired fasting glucose) 08/04/2019  . Abnormal weight gain 11/27/2018  . Abdominal pain, chronic, epigastric 11/27/2018  . BMI 45.0-49.9, adult ([place]) 10/04/2018  . Primary osteoarthritis of left hip 08/05/2018  . Low calcium levels 07/26/2018  . Family history of celiac disease 05/29/2018  . Precordial chest pain 03/06/2018  . Dyslipidemia 03/06/2018  . Adrenal mass ([place]) 01/23/2018  . GERD (gastroesophageal reflux disease) 09/20/2017  . Heavy tobacco smoker 09/20/2017  . Adenoma of left adrenal gland 09/11/2017  . Diverticulosis of colon 09/11/2017  . Left nephrolithiasis 09/11/2017  . Hepatic steatosis 09/07/2017  . Status post left partial knee replacement 08/03/2016  . Rotator cuff tear, left 07/06/2016  . Trochanteric bursitis of left hip 03/31/2015  . Dependent personality disorder ([place]) 12/03/2014  . Chronic pain syndrome 12/03/2014  . Myofascial pain 11/09/2014  . Dysthymic disorder 10/15/2014  .  PTSD (post-traumatic stress disorder) 10/15/2014  . Acute pain of left knee 08/27/2014  . Primary osteoarthritis of both knees 12/21/2011  . B12 DEFICIENCY 06/21/2010  . Vitamin D deficiency 06/21/2010  . NECK PAIN, CHRONIC 06/21/2010  . VARICOSE VEINS, LOWER EXTREMITIES 05/17/2010  . Post-menopausal 05/17/2010  . NUMBNESS, ARM 04/04/2010  . History of DVT (deep vein thrombosis) 01/13/2010  . SEIZURE DISORDER 01/13/2010  . Depression, major, recurrent, moderate ([place]) 05/16/2009  . Morbid obesity ([place]) 03/09/2009  . Anxiety state 03/09/2009  . CIGARETTE SMOKER  03/09/2009  . MULTIPLE SCLEROSIS, RELAPSING/REMITTING 03/09/2009  . Migraine without aura 03/09/2009  . ESSENTIAL HYPERTENSION, BENIGN 03/09/2009  . Lumbar degenerative disc disease 03/09/2009   Kerin Perna, PTA 03/02/20 4:23 PM  [name], [place], [address] Phone: [phone]   Fax:  [phone]  Name: Ashley Gomez MRN: [id number] Date of Birth: 12-Jan-1971

## 2020-03-04 ENCOUNTER — Encounter: Payer: Medicare Other | Admitting: Physical Therapy

## 2020-03-06 NOTE — Progress Notes (Deleted)
Established Patient Office Visit  Subjective:  Patient ID: Ashley Gomez, female    DOB: 31-Dec-1970  Age: 49 y.o. MRN: [id number]  CC: No chief complaint on file.   HPI CEYDA PETERKA presents for follow-up for new diagnosis of atherosclerosis found on imaging in May of this year.  She does have a fairly extensive history of cardiovascular disease and risk factors including past CVA, TIA, Obesity, HTN, low HDL, Pre-diabetes, migraine headaches, DVT, and cigarette smoking.  She also has a history of hyperlipidemia however is not currently taking any statin medications.  Today she reports  HYPERLIPIDEMIA Hyperlipidemia status: {Blank single:19197::"excellent compliance","good compliance","fair compliance","poor compliance"} Satisfied with current treatment?  {Blank single:19197::"yes","no"} Side effects:  {Blank single:19197::"yes","no"} Medication compliance: {Blank single:19197::"excellent compliance","good compliance","fair compliance","poor compliance"} Supplements: {Blank multiple:19196::"none","fish oil","niacin","red yeast rice"} Aspirin:  {Blank single:19197::"yes","no"} The 10-year ASCVD risk score Mikey Bussing DC Jr., et al., 2013) is: 5.3%   Values used to calculate the score:     Age: 53 years     Sex: Female     Is Non-Hispanic African American: No     Diabetic: No     Tobacco smoker: Yes     Systolic Blood Pressure: 638 mmHg     Is BP treated: Yes     HDL Cholesterol: 42 mg/dL     Total Cholesterol: 163 mg/dL Chest pain:  {Blank single:19197::"yes","no"} Coronary artery disease:  {Blank single:19197::"yes","no","unknown"} Family history CAD:  {Blank single:19197::"yes","no","unknown"} Family history Ayanna Gheen CAD:  {Blank single:19197::"yes","no","unknown"}     Past Medical History:  Diagnosis Date  . Anxiety   . Arthritis   . CVA (cerebral infarction)    Old CVA on MRI brain  . Depression   . DVT (deep venous thrombosis) ([place]) 45/36/4680   L basilic vein   . Facet  hypertrophy of lumbar region    MRI 2007  . Fibromyalgia   . GERD (gastroesophageal reflux disease)   . Hypertension   . Migraines   . MS (multiple sclerosis) ([place])   . Neuromuscular disorder ([place])   . Obesity   . Pre-diabetes   . PTSD (post-traumatic stress disorder)   . Seizures ([place]) 2011   no seizure since onset  . Smoking   . Stroke [facility])    TIA, >8years ago    Past Surgical History:  Procedure Laterality Date  . CHOLECYSTECTOMY  10/2017  . CHONDROPLASTY Left 12/30/2014   Procedure: CHONDROPLASTY;  Surgeon: Leandrew Koyanagi, MD;  Location: [facility];  Service: Orthopedics;  Laterality: Left;  . KNEE ARTHROSCOPY Left 12/30/2014   Procedure: LEFT KNEE ARTHROSCOPY WITH   CHONDROPLASTY;  Surgeon: Leandrew Koyanagi, MD;  Location: [facility];  Service: Orthopedics;  Laterality: Left;  . PARTIAL KNEE ARTHROPLASTY Left 08/03/2016   Procedure: LEFT UNICOMPARTMENTAL KNEE ARTHROPLASTY;  Surgeon: Leandrew Koyanagi, MD;  Location: [facility];  Service: Orthopedics;  Laterality: Left;  . SHOULDER ARTHROSCOPY WITH BICEPSTENOTOMY Left 01/15/2020   Procedure: SHOULDER ARTHROSCOPY WITH BICEPSTENOTOMY;  Surgeon: Hiram Gash, MD;  Location: [facility];  Service: Orthopedics;  Laterality: Left;  . SHOULDER ARTHROSCOPY WITH DISTAL CLAVICLE RESECTION Left 01/15/2020   Procedure: SHOULDER ARTHROSCOPY WITH DISTAL CLAVICULECTOMY;  Surgeon: Hiram Gash, MD;  Location: [facility];  Service: Orthopedics;  Laterality: Left;  . SHOULDER ARTHROSCOPY WITH ROTATOR CUFF REPAIR AND SUBACROMIAL DECOMPRESSION Left 01/15/2020   Procedure: SHOULDER ARTHROSCOPY DEBRIDMENT WITH ROTATOR CUFF REPAIR, SUBACROMIAL DECOMPRESSION WITH ACROMIPLASTY, AND BICEP TENOTOMY;  Surgeon: Hiram Gash, MD;  Location: [facility];  Service: Orthopedics;  Laterality: Left;  . TONSILLECTOMY    . TUBAL LIGATION  1992    Family History  Problem Relation Age of Onset  . Cancer Mother  71       melanoma  . Hypertension Sister   . Heart disease Sister        valve disease  . Depression Sister   . Diabetes Sister   . Sjogren's syndrome Sister     Social History   Socioeconomic History  . Marital status: Single    Spouse name: Not on file  . Number of children: Not on file  . Years of education: Not on file  . Highest education level: Not on file  Occupational History  . Not on file  Tobacco Use  . Smoking status: Current Every Day Smoker    Packs/day: 1.00    Years: 25.00    Pack years: 25.00    Types: Cigarettes  . Smokeless tobacco: Never Used  . Tobacco comment: advised to d/c by 3 cigs per week.  Substance and Sexual Activity  . Alcohol use: No    Alcohol/week: 0.0 standard drinks  . Drug use: No  . Sexual activity: Yes    Birth control/protection: None, Post-menopausal  Other Topics Concern  . Not on file  Social History Narrative  . Not on file   Social Determinants of Health   Financial Resource Strain:   . Difficulty of Paying Living Expenses: Not on file  Food Insecurity:   . Worried About Charity fundraiser in the Last Year: Not on file  . Ran Out of Food in the Last Year: Not on file  Transportation Needs:   . Lack of Transportation (Medical): Not on file  . Lack of Transportation (Non-Medical): Not on file  Physical Activity:   . Days of Exercise per Week: Not on file  . Minutes of Exercise per Session: Not on file  Stress:   . Feeling of Stress : Not on file  Social Connections:   . Frequency of Communication with Friends and Family: Not on file  . Frequency of Social Gatherings with Friends and Family: Not on file  . Attends Religious Services: Not on file  . Active Member of Clubs or Organizations: Not on file  . Attends Archivist Meetings: Not on file  . Marital Status: Not on file  Intimate Partner Violence:   . Fear of Current or Ex-Partner: Not on file  . Emotionally Abused: Not on file  . Physically  Abused: Not on file  . Sexually Abused: Not on file    Outpatient Medications Prior to Visit  Medication Sig Dispense Refill  . AIMOVIG 140 MG/ML SOAJ INJECT 140 MG AS DIRECTED EVERY 30 (THIRTY) DAYS. 1 mL 2  . AMBULATORY NON FORMULARY MEDICATION testosterone    . ammonium lactate (LAC-HYDRIN) 12 % lotion See admin instructions.    . busPIRone (BUSPAR) 7.5 MG tablet TAKE 1 TABLET BY MOUTH 2 TIMES DAILY AS NEEDED. 180 tablet 0  . clonazePAM (KLONOPIN) 1 MG tablet TAKE 1 TABLET TWICE A DAY AS NEEDED ANXIETY MUST LAST 30 DAYS 45 tablet 1  . cyclobenzaprine (FLEXERIL) 10 MG tablet Take 10 mg by mouth 2 (two) times daily as needed.    . DULoxetine (CYMBALTA) 30 MG capsule TAKE 1 CAPSULE BY MOUTH DAILY ALONG WITH 60MG 30 capsule 2  . DULoxetine (CYMBALTA) 60 MG capsule TAKE 1 CAPSULE BY MOUTH EVERY DAY  30 capsule 2  . famotidine (PEPCID) 20 MG tablet Take 1 tablet (20 mg total) by mouth daily. 90 tablet 3  . HYDROcodone-acetaminophen (NORCO) 10-325 MG tablet Take 1 tablet by mouth every 6 (six) hours as needed for pain. for pain  0  . metFORMIN (GLUCOPHAGE-XR) 500 MG 24 hr tablet Take 1 tablet (500 mg total) by mouth daily with breakfast. 90 tablet 1  . metoprolol succinate (TOPROL-XL) 25 MG 24 hr tablet Take 1 tablet (25 mg total) by mouth daily. 30 tablet 2  . NARCAN 4 MG/0.1ML LIQD nasal spray kit USE AS DIRECTED    . omeprazole (PRILOSEC) 40 MG capsule TAKE 1 CAPSULE BY MOUTH EVERY DAY 90 capsule 3  . rivaroxaban (XARELTO) 20 MG TABS tablet Take by mouth.    . Triamcinolone Acetonide (TRIAMCINOLONE 0.1 % CREAM : EUCERIN) CREA Apply 1 application topically at bedtime. 1 Jar. 1 each 1   No facility-administered medications prior to visit.    Allergies  Allergen Reactions  . Ace Inhibitors     REACTION: cough  . Atenolol Other (See Comments)    Bradycardia  . Celexa [Citalopram Hydrobromide] Other (See Comments)    Dizziness Tolerated Lexapro   . Codeine Nausea Only  . Nitroglycerin  Other (See Comments)    Drop in BP  . Phenytoin Swelling  . Sertraline Other (See Comments)    unknown  . Sumatriptan Nausea Only  . Tizanidine Other (See Comments)    Keep her awake.   . Topamax [Topiramate] Other (See Comments)    She reports that this makes her forgetful and causes her to studder  . Triamcinolone     Steroid flare after joint injection, use other steroid for injections    ROS Review of Systems All review of systems negative except for what is listed within the HPI.   Objective:    Physical Exam  LMP 12/28/2014  Wt Readings from Last 3 Encounters:  01/15/20 293 lb 3.4 oz (133 kg)  12/05/19 (!) 291 lb (132 kg)  10/03/19 280 lb (127 kg)     Health Maintenance Due  Topic Date Due  . Hepatitis C Screening  Never done  . INFLUENZA VACCINE  12/14/2019    There are no preventive care reminders to display for this patient.  Lab Results  Component Value Date   TSH 1.10 08/04/2019   Lab Results  Component Value Date   WBC 8.1 10/02/2019   HGB 12.1 10/02/2019   HCT 38.2 10/02/2019   MCV 81.3 10/02/2019   PLT 363 10/02/2019   Lab Results  Component Value Date   NA 138 01/12/2020   K 4.2 01/12/2020   CO2 26 01/12/2020   GLUCOSE 99 01/12/2020   BUN 10 01/12/2020   CREATININE 0.74 01/12/2020   BILITOT 0.2 10/03/2019   ALKPHOS 98 11/29/2016   AST 14 10/03/2019   ALT 10 10/03/2019   PROT 5.9 (L) 10/03/2019   ALBUMIN 3.6 11/29/2016   CALCIUM 9.1 01/12/2020   ANIONGAP 10 01/12/2020   Lab Results  Component Value Date   CHOL 163 08/04/2019   Lab Results  Component Value Date   HDL 42 (L) 08/04/2019   Lab Results  Component Value Date   LDLCALC 98 08/04/2019   Lab Results  Component Value Date   TRIG 133 08/04/2019   Lab Results  Component Value Date   CHOLHDL 3.9 08/04/2019   Lab Results  Component Value Date   HGBA1C 5.8 (H) 08/04/2019  Assessment & Plan:   Problem List Items Addressed This Visit    None      No  orders of the defined types were placed in this encounter.   Follow-up: No follow-ups on file.    Orma Render, NP

## 2020-03-08 ENCOUNTER — Ambulatory Visit: Payer: Medicare Other | Admitting: Nurse Practitioner

## 2020-03-08 DIAGNOSIS — I700 Atherosclerosis of aorta: Secondary | ICD-10-CM

## 2020-03-09 ENCOUNTER — Encounter: Payer: Self-pay | Admitting: Rehabilitative and Restorative Service Providers"

## 2020-03-09 ENCOUNTER — Other Ambulatory Visit: Payer: Self-pay

## 2020-03-09 ENCOUNTER — Ambulatory Visit (INDEPENDENT_AMBULATORY_CARE_PROVIDER_SITE_OTHER): Payer: Medicare Other | Admitting: Rehabilitative and Restorative Service Providers"

## 2020-03-09 DIAGNOSIS — R293 Abnormal posture: Secondary | ICD-10-CM

## 2020-03-09 DIAGNOSIS — M25512 Pain in left shoulder: Secondary | ICD-10-CM

## 2020-03-09 DIAGNOSIS — R29898 Other symptoms and signs involving the musculoskeletal system: Secondary | ICD-10-CM | POA: Diagnosis not present

## 2020-03-09 NOTE — Patient Instructions (Signed)
Access Code: [email or web address]: [email or web address]: 10/26/2021Prepared by: Adrienne Delay HoltExercises  Supine Scapular Retraction - 2 x daily - 7 x weekly - 1 sets - 10 reps - 5-10 sec hold  Seated Scapular Retraction - 2 x daily - 7 x weekly - 1-2 sets - 10 reps - 10 sec hold  Seated Shoulder Flexion Towel Slide at Table Top Full Range of Motion - 2 x daily - 7 x weekly - 1 sets - 5-10 reps - 10sec hold  Seated Shoulder External Rotation AAROM with Cane and Hand in Neutral - 2 x daily - 7 x weekly - 1 sets - 5-10 reps - 5-10 sec hold  Circular Shoulder Pendulum with Table Support - 3-4 x daily - 7 x weekly - 1 sets - 20-30 reps

## 2020-03-09 NOTE — Therapy (Signed)
[place], [place], [address] Phone: [phone]   Fax:  [phone]  Physical Therapy Treatment  Patient Details  Name: Ashley Gomez MRN: [id number] Date of Birth: 1971/02/22 Referring Provider (PT): Dr Ophelia Charter    Encounter Date: 03/09/2020   PT End of Session - 03/09/20 0802    Visit Number 3    Number of Visits 24    Date for PT Re-Evaluation 05/07/20    PT Start Time 0801    PT Stop Time 0849    PT Time Calculation (min) 48 min    Activity Tolerance Patient tolerated treatment well           Past Medical History:  Diagnosis Date  . Anxiety   . Arthritis   . CVA (cerebral infarction)    Old CVA on MRI brain  . Depression   . DVT (deep venous thrombosis) ([place]) 03/47/4259   L basilic vein   . Facet hypertrophy of lumbar region    MRI 2007  . Fibromyalgia   . GERD (gastroesophageal reflux disease)   . Hypertension   . Migraines   . MS (multiple sclerosis) ([place])   . Neuromuscular disorder ([place])   . Obesity   . Pre-diabetes   . PTSD (post-traumatic stress disorder)   . Seizures ([place]) 2011   no seizure since onset  . Smoking   . Stroke [facility])    TIA, >8years ago    Past Surgical History:  Procedure Laterality Date  . CHOLECYSTECTOMY  10/2017  . CHONDROPLASTY Left 12/30/2014   Procedure: CHONDROPLASTY;  Surgeon: Leandrew Koyanagi, MD;  Location: [place];  Service: Orthopedics;  Laterality: Left;  . KNEE ARTHROSCOPY Left 12/30/2014   Procedure: LEFT KNEE ARTHROSCOPY WITH   CHONDROPLASTY;  Surgeon: Leandrew Koyanagi, MD;  Location: [place];  Service: Orthopedics;  Laterality: Left;  . PARTIAL KNEE ARTHROPLASTY Left 08/03/2016   Procedure: LEFT UNICOMPARTMENTAL KNEE ARTHROPLASTY;  Surgeon: Leandrew Koyanagi, MD;  Location: [place];  Service: Orthopedics;  Laterality: Left;  . SHOULDER ARTHROSCOPY WITH BICEPSTENOTOMY Left 01/15/2020   Procedure: SHOULDER  ARTHROSCOPY WITH BICEPSTENOTOMY;  Surgeon: Hiram Gash, MD;  Location: [place];  Service: Orthopedics;  Laterality: Left;  . SHOULDER ARTHROSCOPY WITH DISTAL CLAVICLE RESECTION Left 01/15/2020   Procedure: SHOULDER ARTHROSCOPY WITH DISTAL CLAVICULECTOMY;  Surgeon: Hiram Gash, MD;  Location: [place];  Service: Orthopedics;  Laterality: Left;  . SHOULDER ARTHROSCOPY WITH ROTATOR CUFF REPAIR AND SUBACROMIAL DECOMPRESSION Left 01/15/2020   Procedure: SHOULDER ARTHROSCOPY DEBRIDMENT WITH ROTATOR CUFF REPAIR, SUBACROMIAL DECOMPRESSION WITH ACROMIPLASTY, AND BICEP TENOTOMY;  Surgeon: Hiram Gash, MD;  Location: [place];  Service: Orthopedics;  Laterality: Left;  . TONSILLECTOMY    . TUBAL LIGATION  1992    There were no vitals filed for this visit.   Subjective Assessment - 03/09/20 0802    Subjective When her shoulder feels good she uses it for activities that she should not be doing. She feels great most of the time but mornings are worse.    Currently in Pain? Yes    Pain Score 7     Pain Location Shoulder    Pain Orientation Left    Pain Descriptors / Indicators Sharp;Spasm    Pain Type Surgical pain;Chronic pain    Pain Onset More than a month ago    Pain Frequency Intermittent  [facility] PT Assessment - 03/09/20 0001      Assessment   Medical Diagnosis Lt RCR; DCR; SAD; debridement     Referring Provider (PT) Dr Ophelia Charter     Onset Date/Surgical Date 01/15/20   injury ~ 1 yr ago    Hand Dominance Right    Next MD Visit 04/13/20    Prior Therapy 1 visit for shoulder 6/21      PROM   Right/Left Shoulder --   pt supine    Left Shoulder Flexion 134 Degrees    Left Shoulder ABduction 90 Degrees   in scapular plane   Left Shoulder External Rotation 62 Degrees   in scapular plane      Palpation   Palpation comment muscular tighness and pain Lt shoulder girdle - deltoid; pecs; upper trap; leveator; teres                            [facility] Adult PT Treatment/Exercise - 03/09/20 0001      Shoulder Exercises: Supine   Other Supine Exercises scap retraction 10 sec x 10 reps       Shoulder Exercises: Seated   Other Seated Exercises shoulder rolls x 10 reps backwards;  scap squeeze x 5 sec x 8 reps       Shoulder Exercises: Standing   Other Standing Exercises scap squeeze w/noodle 10 sec x 10       Shoulder Exercises: Pulleys   Flexion --   5 reps; 5 sec hold   Flexion Limitations limited tolerance; guarding; c/o pain       Shoulder Exercises: ROM/Strengthening   Pendulum 30 CW/30 CCW      Shoulder Exercises: Isometric Strengthening   External Rotation 5X5"   gentle contraction; not added to HEP yet.    Internal Rotation 5X5"   gentle contraction; not added to HEP yet.      Shoulder Exercises: Stretch   External Rotation Stretch 10 seconds   with cane - 10 reps; elbow at side 90 deg flex   Table Stretch -Flexion Limitations 10 reps x 10 sec hold     Other Shoulder Stretches placing Lt hand on table and gently straightening elbow 10 sec x 3 reps       Modalities   Modalities Vasopneumatic      Vasopneumatic   Number Minutes Vasopneumatic  10 minutes    Vasopnuematic Location  Shoulder    Vasopneumatic Pressure Low    Vasopneumatic Temperature  34 deg       Manual Therapy   Manual Therapy Passive ROM    Manual therapy comments pt supine     Soft tissue mobilization through the Lt upper trap; leveator; pecs; anterior shoulder     Scapular Mobilization Lt     Passive ROM Lt shoulder flexion, scaption, ER, ext to tissue limits and no pain.                   PT Education - 03/09/20 0836    Education Details HEP    Person(s) Educated Patient    Methods Explanation;Demonstration;Tactile cues;Verbal cues;Handout    Comprehension Verbalized understanding;Returned demonstration;Verbal cues required;Tactile cues required            PT Short Term Goals - 02/27/20 0845       PT SHORT TERM GOAL #1   Title Independent in initial HEP    Time 6    Period Weeks  Status New    Target Date 04/09/20      PT SHORT TERM GOAL #2   Title Increase AROM to at least 90 to 100 deg flexion and abduction    Time 6    Period Weeks    Status New    Target Date 04/09/20      PT SHORT TERM GOAL #3   Title Improve posterior shoulder girdle strength with patient to demonstrate improved thoracic extension and posture    Time 6    Period Weeks    Status New    Target Date 04/09/20             PT Long Term Goals - 02/27/20 0847      PT LONG TERM GOAL #1   Title Increase AROM Lt shoulder to 120-130 deg flex and abd; 70-80 deg ER to allow patient to return to functional activities using Lt UE    Time 12    Period Weeks    Status New    Target Date 05/07/20      PT LONG TERM GOAL #2   Title 4/5 to 4+/5 strength Lt UE    Time 12    Period Weeks    Status New    Target Date 05/07/20      PT LONG TERM GOAL #3   Title Decrease pain Lt shoulder by 50-75% allowing patient to use Lt UE for functional activities at prior level of function    Time 12    Period Weeks    Status New    Target Date 05/07/20      PT LONG TERM GOAL #4   Title Independent in HEP    Time 12    Period Weeks    Status New    Target Date 05/07/20      PT LONG TERM GOAL #5   Title Improve FOTO to </= 47% limitation    Time 6    Period Weeks    Status New    Target Date 05/07/20                 Plan - 03/09/20 8242    Clinical Impression Statement Continued flare up of symptoms with patient reporting that she uses her arm when it feels better and then has increased pain afterward. Continued with AAROM and PROM.Good gains noted in PROM. Continue to stress the importance of following post op precautions.    Rehab Potential Good    PT Frequency 2x / week    PT Duration 12 weeks    PT Treatment/Interventions ADLs/Self Care Home Management;Aquatic  Therapy;Cryotherapy;Electrical Stimulation;Iontophoresis 4mg /ml Dexamethasone;Moist Heat;Ultrasound;Functional mobility training;Therapeutic activities;Therapeutic exercise;Neuromuscular re-education;Patient/family education;Manual techniques;Passive range of motion;Dry needling;Taping;Vasopneumatic Device    PT Next Visit Plan review HEP; progress with pulley; PROM; stretching; manual work; modalities as indicated    PT Home Exercise Plan [email or web address]    Consulted and Agree with Plan of Care Patient           Patient will benefit from skilled therapeutic intervention in order to improve the following deficits and impairments:     Visit Diagnosis: Acute pain of left shoulder  Abnormal posture  Other symptoms and signs involving the musculoskeletal system     Problem List Patient Active Problem List   Diagnosis Date Noted  . Squamous cell skin cancer 08/04/2019  . IFG (impaired fasting glucose) 08/04/2019  . Abnormal weight gain 11/27/2018  . Abdominal pain, chronic, epigastric 11/27/2018  . BMI 45.0-49.9, adult (  [place]) 10/04/2018  . Primary osteoarthritis of left hip 08/05/2018  . Low calcium levels 07/26/2018  . Family history of celiac disease 05/29/2018  . Precordial chest pain 03/06/2018  . Dyslipidemia 03/06/2018  . Adrenal mass ([place]) 01/23/2018  . GERD (gastroesophageal reflux disease) 09/20/2017  . Heavy tobacco smoker 09/20/2017  . Adenoma of left adrenal gland 09/11/2017  . Diverticulosis of colon 09/11/2017  . Left nephrolithiasis 09/11/2017  . Hepatic steatosis 09/07/2017  . Status post left partial knee replacement 08/03/2016  . Rotator cuff tear, left 07/06/2016  . Trochanteric bursitis of left hip 03/31/2015  . Dependent personality disorder ([place]) 12/03/2014  . Chronic pain syndrome 12/03/2014  . Myofascial pain 11/09/2014  . Dysthymic disorder 10/15/2014  . PTSD (post-traumatic stress disorder) 10/15/2014  . Acute pain of left knee 08/27/2014  . Primary  osteoarthritis of both knees 12/21/2011  . B12 DEFICIENCY 06/21/2010  . Vitamin D deficiency 06/21/2010  . NECK PAIN, CHRONIC 06/21/2010  . VARICOSE VEINS, LOWER EXTREMITIES 05/17/2010  . Post-menopausal 05/17/2010  . NUMBNESS, ARM 04/04/2010  . History of DVT (deep vein thrombosis) 01/13/2010  . SEIZURE DISORDER 01/13/2010  . Depression, major, recurrent, moderate ([place]) 05/16/2009  . Morbid obesity ([place]) 03/09/2009  . Anxiety state 03/09/2009  . CIGARETTE SMOKER 03/09/2009  . MULTIPLE SCLEROSIS, RELAPSING/REMITTING 03/09/2009  . Migraine without aura 03/09/2009  . ESSENTIAL HYPERTENSION, BENIGN 03/09/2009  . Lumbar degenerative disc disease 03/09/2009    Alfreida Steffenhagen Nilda Simmer PT, MPH  03/09/2020, 8:46 AM  [facility] [place], [place], [address] Phone: [phone]   Fax:  [phone]  Name: Ashley Gomez MRN: [id number] Date of Birth: 03-Jun-1970

## 2020-03-10 ENCOUNTER — Other Ambulatory Visit: Payer: Self-pay | Admitting: Family Medicine

## 2020-03-10 DIAGNOSIS — F17210 Nicotine dependence, cigarettes, uncomplicated: Secondary | ICD-10-CM | POA: Diagnosis not present

## 2020-03-10 DIAGNOSIS — M545 Low back pain, unspecified: Secondary | ICD-10-CM | POA: Diagnosis not present

## 2020-03-10 DIAGNOSIS — Z79899 Other long term (current) drug therapy: Secondary | ICD-10-CM | POA: Diagnosis not present

## 2020-03-10 DIAGNOSIS — Z1231 Encounter for screening mammogram for malignant neoplasm of breast: Secondary | ICD-10-CM

## 2020-03-10 DIAGNOSIS — G8929 Other chronic pain: Secondary | ICD-10-CM | POA: Diagnosis not present

## 2020-03-11 ENCOUNTER — Encounter: Payer: Self-pay | Admitting: Family Medicine

## 2020-03-11 ENCOUNTER — Encounter: Payer: Medicare Other | Admitting: Physical Therapy

## 2020-03-11 ENCOUNTER — Ambulatory Visit (INDEPENDENT_AMBULATORY_CARE_PROVIDER_SITE_OTHER): Payer: Medicare Other | Admitting: Family Medicine

## 2020-03-11 VITALS — BP 136/71 | HR 80 | Ht 66.0 in | Wt 300.0 lb

## 2020-03-11 DIAGNOSIS — G43019 Migraine without aura, intractable, without status migrainosus: Secondary | ICD-10-CM

## 2020-03-11 DIAGNOSIS — R140 Abdominal distension (gaseous): Secondary | ICD-10-CM

## 2020-03-11 DIAGNOSIS — T50905A Adverse effect of unspecified drugs, medicaments and biological substances, initial encounter: Secondary | ICD-10-CM

## 2020-03-11 DIAGNOSIS — I700 Atherosclerosis of aorta: Secondary | ICD-10-CM

## 2020-03-11 DIAGNOSIS — Z6841 Body Mass Index (BMI) 40.0 and over, adult: Secondary | ICD-10-CM

## 2020-03-11 DIAGNOSIS — Z23 Encounter for immunization: Secondary | ICD-10-CM

## 2020-03-11 DIAGNOSIS — R7301 Impaired fasting glucose: Secondary | ICD-10-CM

## 2020-03-11 DIAGNOSIS — F411 Generalized anxiety disorder: Secondary | ICD-10-CM

## 2020-03-11 LAB — POCT GLYCOSYLATED HEMOGLOBIN (HGB A1C): Hemoglobin A1C: 5.9 % — AB (ref 4.0–5.6)

## 2020-03-11 MED ORDER — CLONAZEPAM 1 MG PO TABS: ORAL_TABLET | ORAL | 1 refills | Status: DC

## 2020-03-11 MED ORDER — EMGALITY 120 MG/ML ~~LOC~~ SOAJ: SUBCUTANEOUS | 3 refills | Status: DC

## 2020-03-11 MED ORDER — GOLYTELY 227.1 G PO SOLR: ORAL | 0 refills | Status: DC

## 2020-03-11 MED ORDER — METOPROLOL SUCCINATE ER 25 MG PO TB24: 25.0000 mg | ORAL_TABLET | Freq: Every day | ORAL | 2 refills | Status: DC

## 2020-03-11 NOTE — Progress Notes (Signed)
Established Patient Office Visit  Subjective:  Patient ID: Ashley Gomez, female    DOB: January 27, 1971  Age: 49 y.o. MRN: [id number]  CC:  Chief Complaint  Patient presents with  . Follow-up    HPI Ashley Gomez presents for   Impaired fasting glucose-no increased thirst or urination. No symptoms consistent with hypoglycemia. Her weight is up and she can only lift 2 pounds.  She is doing better by eating breakfast now although she does not usually eat lunch and does eat a dinner before 630.  But she continues to gain weight and just feels really frustrated.  She did have her shoulder surgery is that she is recovering well she is been going to physical therapy almost twice a week but plans on reducing that to about once a day.  I did update her chart she was noted to hav  e atherosclerotic plaque in the abdominal aorta without any aneurysm on recent CT angio of the abdomen and pelvis noted in May 2021.  Migraines-she feels like the Aimovig is just really not helping in fact she did not pick up this last refill she is willing to try something different.  He has a lot of body pain and joint pain and would like to be tested for rheumatoid arthritis.  I reviewed with her that we had actually already checked these labs about a year and a half ago and she does not have any of the classic findings for rheumatoid.  Reports that she has used Celebrex and it did help but unfortunately caused swelling and so was unable to take it.  He also recently noticed a callus on the base of her left heel a few weeks ago.  She is not sure what caused it and would like me to take a look at it today.  Would also like a recommendation for how to get her bowels cleaned out she has been having a lot of GI discomfort and bloating and irregularity.  Still having a lot of fullness and swelling and bloating in the right upper quadrant.  She says she will get pain and that it will feel hard to press on.  Past  Medical History:  Diagnosis Date  . Anxiety   . Arthritis   . CVA (cerebral infarction)    Old CVA on MRI brain  . Depression   . DVT (deep venous thrombosis) ([place]) 11/05/7626   L basilic vein   . Facet hypertrophy of lumbar region    MRI 2007  . Fibromyalgia   . GERD (gastroesophageal reflux disease)   . Hypertension   . Migraines   . MS (multiple sclerosis) ([place])   . Neuromuscular disorder ([place])   . Obesity   . Pre-diabetes   . PTSD (post-traumatic stress disorder)   . Seizures ([place]) 2011   no seizure since onset  . Smoking   . Stroke [facility])    TIA, >8years ago    Past Surgical History:  Procedure Laterality Date  . CHOLECYSTECTOMY  10/2017  . CHONDROPLASTY Left 12/30/2014   Procedure: CHONDROPLASTY;  Surgeon: Leandrew Koyanagi, MD;  Location: [place];  Service: Orthopedics;  Laterality: Left;  . KNEE ARTHROSCOPY Left 12/30/2014   Procedure: LEFT KNEE ARTHROSCOPY WITH   CHONDROPLASTY;  Surgeon: Leandrew Koyanagi, MD;  Location: [place];  Service: Orthopedics;  Laterality: Left;  . PARTIAL KNEE ARTHROPLASTY Left 08/03/2016   Procedure: LEFT UNICOMPARTMENTAL KNEE ARTHROPLASTY;  Surgeon: Leandrew Koyanagi, MD;  Location: [place];  Service: Orthopedics;  Laterality: Left;  . SHOULDER ARTHROSCOPY WITH BICEPSTENOTOMY Left 01/15/2020   Procedure: SHOULDER ARTHROSCOPY WITH BICEPSTENOTOMY;  Surgeon: Hiram Gash, MD;  Location: [place];  Service: Orthopedics;  Laterality: Left;  . SHOULDER ARTHROSCOPY WITH DISTAL CLAVICLE RESECTION Left 01/15/2020   Procedure: SHOULDER ARTHROSCOPY WITH DISTAL CLAVICULECTOMY;  Surgeon: Hiram Gash, MD;  Location: [place];  Service: Orthopedics;  Laterality: Left;  . SHOULDER ARTHROSCOPY WITH ROTATOR CUFF REPAIR AND SUBACROMIAL DECOMPRESSION Left 01/15/2020   Procedure: SHOULDER ARTHROSCOPY DEBRIDMENT WITH ROTATOR CUFF REPAIR, SUBACROMIAL DECOMPRESSION WITH ACROMIPLASTY, AND BICEP TENOTOMY;  Surgeon:  Hiram Gash, MD;  Location: [place];  Service: Orthopedics;  Laterality: Left;  . TONSILLECTOMY    . TUBAL LIGATION  1992    Family History  Problem Relation Age of Onset  . Cancer Mother 99       melanoma  . Hypertension Sister   . Heart disease Sister        valve disease  . Depression Sister   . Diabetes Sister   . Sjogren's syndrome Sister     Social History   Socioeconomic History  . Marital status: Single    Spouse name: Not on file  . Number of children: Not on file  . Years of education: Not on file  . Highest education level: Not on file  Occupational History  . Not on file  Tobacco Use  . Smoking status: Current Every Day Smoker    Packs/day: 1.00    Years: 25.00    Pack years: 25.00    Types: Cigarettes  . Smokeless tobacco: Never Used  . Tobacco comment: advised to d/c by 3 cigs per week.  Substance and Sexual Activity  . Alcohol use: No    Alcohol/week: 0.0 standard drinks  . Drug use: No  . Sexual activity: Yes    Birth control/protection: None, Post-menopausal  Other Topics Concern  . Not on file  Social History Narrative  . Not on file   Social Determinants of Health   Financial Resource Strain:   . Difficulty of Paying Living Expenses: Not on file  Food Insecurity:   . Worried About Charity fundraiser in the Last Year: Not on file  . Ran Out of Food in the Last Year: Not on file  Transportation Needs:   . Lack of Transportation (Medical): Not on file  . Lack of Transportation (Non-Medical): Not on file  Physical Activity:   . Days of Exercise per Week: Not on file  . Minutes of Exercise per Session: Not on file  Stress:   . Feeling of Stress : Not on file  Social Connections:   . Frequency of Communication with Friends and Family: Not on file  . Frequency of Social Gatherings with Friends and Family: Not on file  . Attends Religious Services: Not on file  . Active Member of Clubs or Organizations: Not on file  .  Attends Archivist Meetings: Not on file  . Marital Status: Not on file  Intimate Partner Violence:   . Fear of Current or Ex-Partner: Not on file  . Emotionally Abused: Not on file  . Physically Abused: Not on file  . Sexually Abused: Not on file    Outpatient Medications Prior to Visit  Medication Sig Dispense Refill  . AIMOVIG 140 MG/ML SOAJ INJECT 140 MG AS DIRECTED EVERY 30 (THIRTY) DAYS. 1 mL 2  . AMBULATORY  NON FORMULARY MEDICATION testosterone    . ammonium lactate (LAC-HYDRIN) 12 % lotion See admin instructions.    . busPIRone (BUSPAR) 7.5 MG tablet TAKE 1 TABLET BY MOUTH 2 TIMES DAILY AS NEEDED. 180 tablet 0  . cyclobenzaprine (FLEXERIL) 10 MG tablet Take 10 mg by mouth 2 (two) times daily as needed.    . diclofenac (VOLTAREN) 75 MG EC tablet Take 75 mg by mouth 2 (two) times daily as needed.    . DULoxetine (CYMBALTA) 30 MG capsule TAKE 1 CAPSULE BY MOUTH DAILY ALONG WITH 60MG 30 capsule 2  . DULoxetine (CYMBALTA) 60 MG capsule TAKE 1 CAPSULE BY MOUTH EVERY DAY 30 capsule 2  . famotidine (PEPCID) 20 MG tablet Take 1 tablet (20 mg total) by mouth daily. 90 tablet 3  . HYDROcodone-acetaminophen (NORCO) 10-325 MG tablet Take 1 tablet by mouth every 6 (six) hours as needed for pain. for pain  0  . metFORMIN (GLUCOPHAGE-XR) 500 MG 24 hr tablet Take 1 tablet (500 mg total) by mouth daily with breakfast. 90 tablet 1  . NARCAN 4 MG/0.1ML LIQD nasal spray kit USE AS DIRECTED    . omeprazole (PRILOSEC) 40 MG capsule TAKE 1 CAPSULE BY MOUTH EVERY DAY 90 capsule 3  . rivaroxaban (XARELTO) 20 MG TABS tablet Take by mouth.    . clonazePAM (KLONOPIN) 1 MG tablet TAKE 1 TABLET TWICE A DAY AS NEEDED ANXIETY MUST LAST 30 DAYS 45 tablet 1  . metoprolol succinate (TOPROL-XL) 25 MG 24 hr tablet Take 1 tablet (25 mg total) by mouth daily. 30 tablet 2   No facility-administered medications prior to visit.    Allergies  Allergen Reactions  . Ace Inhibitors     REACTION: cough  .  Atenolol Other (See Comments)    Bradycardia  . Celebrex [Celecoxib] Other (See Comments)    Swelling in extremities  . Celexa [Citalopram Hydrobromide] Other (See Comments)    Dizziness Tolerated Lexapro   . Codeine Nausea Only  . Nitroglycerin Other (See Comments)    Drop in BP  . Phenytoin Swelling  . Sertraline Other (See Comments)    unknown  . Sumatriptan Nausea Only  . Tizanidine Other (See Comments)    Keep her awake.   . Topamax [Topiramate] Other (See Comments)    She reports that this makes her forgetful and causes her to studder  . Triamcinolone     Steroid flare after joint injection, use other steroid for injections    ROS Review of Systems    Objective:    Physical Exam Constitutional:      Appearance: She is well-developed.  HENT:     Head: Normocephalic and atraumatic.  Cardiovascular:     Rate and Rhythm: Normal rate and regular rhythm.     Heart sounds: Normal heart sounds.  Pulmonary:     Effort: Pulmonary effort is normal.     Breath sounds: Normal breath sounds.  Abdominal:     General: Bowel sounds are normal. There is no distension.     Palpations: Abdomen is soft. There is no mass.  Skin:    General: Skin is warm and dry.     Comments: The base of the left heel she does have a very round circular callus the size of a silver dollar.  Neurological:     Mental Status: She is alert and oriented to person, place, and time.  Psychiatric:        Behavior: Behavior normal.     BP  136/71   Pulse 80   Ht '5\' 6"'  (1.676 m)   Wt 300 lb (136.1 kg)   LMP 12/28/2014   SpO2 94%   BMI 48.42 kg/m  Wt Readings from Last 3 Encounters:  03/11/20 300 lb (136.1 kg)  01/15/20 293 lb 3.4 oz (133 kg)  12/05/19 (!) 291 lb (132 kg)     Health Maintenance Due  Topic Date Due  . Hepatitis C Screening  Never done    There are no preventive care reminders to display for this patient.  Lab Results  Component Value Date   TSH 1.10 08/04/2019   Lab  Results  Component Value Date   WBC 8.1 10/02/2019   HGB 12.1 10/02/2019   HCT 38.2 10/02/2019   MCV 81.3 10/02/2019   PLT 363 10/02/2019   Lab Results  Component Value Date   NA 138 01/12/2020   K 4.2 01/12/2020   CO2 26 01/12/2020   GLUCOSE 99 01/12/2020   BUN 10 01/12/2020   CREATININE 0.74 01/12/2020   BILITOT 0.2 10/03/2019   ALKPHOS 98 11/29/2016   AST 14 10/03/2019   ALT 10 10/03/2019   PROT 5.9 (L) 10/03/2019   ALBUMIN 3.6 11/29/2016   CALCIUM 9.1 01/12/2020   ANIONGAP 10 01/12/2020   Lab Results  Component Value Date   CHOL 163 08/04/2019   Lab Results  Component Value Date   HDL 42 (L) 08/04/2019   Lab Results  Component Value Date   LDLCALC 98 08/04/2019   Lab Results  Component Value Date   TRIG 133 08/04/2019   Lab Results  Component Value Date   CHOLHDL 3.9 08/04/2019   Lab Results  Component Value Date   HGBA1C 5.9 (A) 03/11/2020      Assessment & Plan:   Problem List Items Addressed This Visit      Cardiovascular and Mediastinum   Migraine without aura    Discussed her options.  We will discontinue Aimovig and see if insurance will cover Em[employer]ality.  Prescription sent to pharmacy.      Relevant Medications   diclofenac (VOLTAREN) 75 MG EC tablet   metoprolol succinate (TOPROL-XL) 25 MG 24 hr tablet   clonazePAM (KLONOPIN) 1 MG tablet   Galcanezumab-gnlm (EMGALITY) 120 MG/ML SOAJ   Aortic atherosclerosis (HCC)    Atherosclerosis of the aorta noted on recent imaging.  She would be a good candidate for a statin.  We will discuss further at next office visit.      Relevant Medications   metoprolol succinate (TOPROL-XL) 25 MG 24 hr tablet     Endocrine   IFG (impaired fasting glucose) - Primary    A1c looks fantastic today at 5.9.  Continue to monitor.  Lab Results  Component Value Date   HGBA1C 5.9 (A) 03/11/2020         Relevant Orders   POCT glycosylated hemoglobin (Hb A1C) (Completed)     Other   BMI 45.0-49.9, adult  ([place])    Commend referral to a bariatric program.  I think she would really benefit from a more comprehensive program we tried several options in the past unfortunately she is limited somewhat based on her insurance plan.  But she has not been successful to maintain her weight loss in the past.      Relevant Orders   Amb Ref to Medical Weight Management   Anxiety state   Relevant Medications   clonazePAM (KLONOPIN) 1 MG tablet    Other Visit Diagnoses  Need for immunization against influenza       Relevant Orders   Flu Vaccine QUAD 36+ mos IM (Completed)   Medication side effect, initial encounter       Bloating         Medication side effect-added Celebrex to intolerance list.  Status post shoulder surgery-doing well currently enrolled in physical therapy.  Callus on the bottom of left heel.  She wants to know if it is okay to get a pedicure.  Encouraged her to work on debriding some of the dead skin off.  Calluses typically are formed from friction.  If recurring or changing in size or nature then please let me know.  Bloating and GI discomfort-recommend GoLYTELY to clean out the colon.  Meds ordered this encounter  Medications  . metoprolol succinate (TOPROL-XL) 25 MG 24 hr tablet    Sig: Take 1 tablet (25 mg total) by mouth daily.    Dispense:  30 tablet    Refill:  2  . clonazePAM (KLONOPIN) 1 MG tablet    Sig: TAKE 1 TABLET TWICE A DAY AS NEEDED ANXIETY MUST LAST 30 DAYS    Dispense:  45 tablet    Refill:  1    Not to exceed 5 additional fills before 04/19/2020 DX Code Needed  .  Marland Kitchen Galcanezumab-gnlm (EMGALITY) 120 MG/ML SOAJ    Sig: 240 [place] x 1, then 135m  q month.    Dispense:  2 mL    Refill:  3  . PEG 3350-KCl-NaBcb-NaCl-NaSulf (GOLYTELY) 227.1 g PACK    Sig: Take as directed.    Dispense:  1 each    Refill:  0    Follow-up: Return in about 3 months (around 06/11/2020) for Migraine Headaches .    CBeatrice Lecher MD

## 2020-03-12 ENCOUNTER — Other Ambulatory Visit (HOSPITAL_COMMUNITY): Payer: Self-pay | Admitting: Psychiatry

## 2020-03-12 ENCOUNTER — Encounter: Payer: Self-pay | Admitting: Family Medicine

## 2020-03-12 ENCOUNTER — Telehealth: Payer: Self-pay | Admitting: *Deleted

## 2020-03-12 NOTE — Telephone Encounter (Signed)
[employer] approved.  Pharmacy notified.

## 2020-03-12 NOTE — Telephone Encounter (Signed)
Prior auth submitted for [employer]. Awaiting response from ins.

## 2020-03-12 NOTE — Assessment & Plan Note (Signed)
Discussed her options.  We will discontinue Aimovig and see if insurance will cover Emgality.  Prescription sent to pharmacy.

## 2020-03-12 NOTE — Assessment & Plan Note (Signed)
Commend referral to a bariatric program.  I think she would really benefit from a more comprehensive program we tried several options in the past unfortunately she is limited somewhat based on her insurance plan.  But she has not been successful to maintain her weight loss in the past.

## 2020-03-12 NOTE — Assessment & Plan Note (Signed)
A1c looks fantastic today at 5.9.  Continue to monitor.  Lab Results  Component Value Date   HGBA1C 5.9 (A) 03/11/2020

## 2020-03-12 NOTE — Assessment & Plan Note (Signed)
Atherosclerosis of the aorta noted on recent imaging.  She would be a good candidate for a statin.  We will discuss further at next office visit.

## 2020-03-16 ENCOUNTER — Other Ambulatory Visit: Payer: Self-pay

## 2020-03-16 ENCOUNTER — Ambulatory Visit (INDEPENDENT_AMBULATORY_CARE_PROVIDER_SITE_OTHER): Payer: Medicare Other | Admitting: Physical Therapy

## 2020-03-16 DIAGNOSIS — M25512 Pain in left shoulder: Secondary | ICD-10-CM

## 2020-03-16 DIAGNOSIS — R293 Abnormal posture: Secondary | ICD-10-CM

## 2020-03-16 DIAGNOSIS — R29898 Other symptoms and signs involving the musculoskeletal system: Secondary | ICD-10-CM

## 2020-03-16 NOTE — Therapy (Addendum)
[facility] [place], [place], [address] Phone: [phone]   Fax:  [phone]  Physical Therapy Treatment  Patient Details  Name: Ashley Gomez MRN: [id number] Date of Birth: 1970-09-20 Referring Provider (PT): Dr Ophelia Charter    Encounter Date: 03/16/2020   PT End of Session - 03/16/20 1110    Visit Number 4    Number of Visits 24    Date for PT Re-Evaluation 05/07/20    PT Start Time 1105    PT Stop Time 1151    PT Time Calculation (min) 46 min    Activity Tolerance Patient tolerated treatment well           Past Medical History:  Diagnosis Date  . Anxiety   . Arthritis   . CVA (cerebral infarction)    Old CVA on MRI brain  . Depression   . DVT (deep venous thrombosis) ([place]) 62/37/6283   L basilic vein   . Facet hypertrophy of lumbar region    MRI 2007  . Fibromyalgia   . GERD (gastroesophageal reflux disease)   . Hypertension   . Migraines   . MS (multiple sclerosis) ([place])   . Neuromuscular disorder ([place])   . Obesity   . Pre-diabetes   . PTSD (post-traumatic stress disorder)   . Seizures ([place]) 2011   no seizure since onset  . Smoking   . Stroke [facility])    TIA, >8years ago    Past Surgical History:  Procedure Laterality Date  . CHOLECYSTECTOMY  10/2017  . CHONDROPLASTY Left 12/30/2014   Procedure: CHONDROPLASTY;  Surgeon: Leandrew Koyanagi, MD;  Location: [facility];  Service: Orthopedics;  Laterality: Left;  . KNEE ARTHROSCOPY Left 12/30/2014   Procedure: LEFT KNEE ARTHROSCOPY WITH   CHONDROPLASTY;  Surgeon: Leandrew Koyanagi, MD;  Location: [facility];  Service: Orthopedics;  Laterality: Left;  . PARTIAL KNEE ARTHROPLASTY Left 08/03/2016   Procedure: LEFT UNICOMPARTMENTAL KNEE ARTHROPLASTY;  Surgeon: Leandrew Koyanagi, MD;  Location: [facility];  Service: Orthopedics;  Laterality: Left;  . SHOULDER ARTHROSCOPY WITH BICEPSTENOTOMY Left 01/15/2020   Procedure: SHOULDER  ARTHROSCOPY WITH BICEPSTENOTOMY;  Surgeon: Hiram Gash, MD;  Location: [facility] [facility];  Service: Orthopedics;  Laterality: Left;  . SHOULDER ARTHROSCOPY WITH DISTAL CLAVICLE RESECTION Left 01/15/2020   Procedure: SHOULDER ARTHROSCOPY WITH DISTAL CLAVICULECTOMY;  Surgeon: Hiram Gash, MD;  Location: [facility];  Service: Orthopedics;  Laterality: Left;  . SHOULDER ARTHROSCOPY WITH ROTATOR CUFF REPAIR AND SUBACROMIAL DECOMPRESSION Left 01/15/2020   Procedure: SHOULDER ARTHROSCOPY DEBRIDMENT WITH ROTATOR CUFF REPAIR, SUBACROMIAL DECOMPRESSION WITH ACROMIPLASTY, AND BICEP TENOTOMY;  Surgeon: Hiram Gash, MD;  Location: [place];  Service: Orthopedics;  Laterality: Left;  . TONSILLECTOMY    . TUBAL LIGATION  1992    There were no vitals filed for this visit.   Subjective Assessment - 03/16/20 1111    Subjective Pt reports the surgeon called her with a good report of ROM (from PT).   She states she is getting an MRI for back; complains of back pain and has limited ability to stand.  She is requesting to decrease frequency due to increase in child care for her grandchildren.    Pertinent History LBP; MS; migraines; seziures; skin cancer; Lt partial knee replacement    Currently in Pain? Yes    Pain Score 5     Pain Location Shoulder    Pain Orientation  Left    Pain Descriptors / Indicators Aching    Aggravating Factors  sudden movements "the wrong way"    Pain Relieving Factors medicine, ice    Multiple Pain Sites Yes    Pain Score 9    Pain Location Back   lower             [facility] PT Assessment - 03/16/20 0001      Assessment   Medical Diagnosis Lt RCR; DCR; SAD; debridement     Referring Provider (PT) Dr Ophelia Charter     Onset Date/Surgical Date 01/15/20   injury ~ 1 yr ago    Hand Dominance Right    Next MD Visit 04/13/20    Prior Therapy 1 visit for shoulder 6/21      PROM   Left Shoulder Flexion 134 Degrees    Left Shoulder ABduction  110 Degrees   in scapular plane   Left Shoulder External Rotation 77 Degrees   in scapular plane            [facility] Adult PT Treatment/Exercise - 03/16/20 0001      Shoulder Exercises: Supine   Horizontal ABduction AAROM;Both;5 reps    Flexion Both;10 reps;AAROM   cane    ABduction AAROM;Left;5 reps   cane   Other Supine Exercises supine AAROM with cane to 90 deg flexion and circles x 10       Shoulder Exercises: Standing   Extension AAROM;Both;10 reps   cane   Other Standing Exercises wall ladder with Lt shoulder flexion x 4 reps       Shoulder Exercises: Pulleys   Flexion 3 minutes    Flexion Limitations limited tolerance      Shoulder Exercises: Isometric Strengthening   Flexion 5X5"    Extension 5X5"    External Rotation 5X5"    Internal Rotation 5X5"      Shoulder Exercises: Stretch   Wall Stretch - Flexion 3 reps   with wash cloth   [place]azer Stretch 2 reps;20 seconds   hands on top of head     Modalities   Modalities --   pt declined            PT Short Term Goals - 02/27/20 0845      PT SHORT TERM GOAL #1   Title Independent in initial HEP    Time 6    Period Weeks    Status New    Target Date 04/09/20      PT SHORT TERM GOAL #2   Title Increase AROM to at least 90 to 100 deg flexion and abduction    Time 6    Period Weeks    Status New    Target Date 04/09/20      PT SHORT TERM GOAL #3   Title Improve posterior shoulder girdle strength with patient to demonstrate improved thoracic extension and posture    Time 6    Period Weeks    Status New    Target Date 04/09/20             PT Long Term Goals - 02/27/20 0847      PT LONG TERM GOAL #1   Title Increase AROM Lt shoulder to 120-130 deg flex and abd; 70-80 deg ER to allow patient to return to functional activities using Lt UE    Time 12    Period Weeks    Status New    Target Date 05/07/20  PT LONG TERM GOAL #2   Title 4/5 to 4+/5 strength Lt UE    Time 12    Period Weeks     Status New    Target Date 05/07/20      PT LONG TERM GOAL #3   Title Decrease pain Lt shoulder by 50-75% allowing patient to use Lt UE for functional activities at prior level of function    Time 12    Period Weeks    Status New    Target Date 05/07/20      PT LONG TERM GOAL #4   Title Independent in HEP    Time 12    Period Weeks    Status New    Target Date 05/07/20      PT LONG TERM GOAL #5   Title Improve FOTO to </= 47% limitation    Time 6    Period Weeks    Status New    Target Date 05/07/20                 Plan - 03/16/20 1302    Clinical Impression Statement Pt reporting increased discomfort at [facility] joint with all exercises performed today.  Reminded pt to complete exercises within tolerance and to not over-do it.  ROM in Lt shoulder gradually improving.  Pt gradually progressing towards LTGs.    Rehab Potential Good    PT Frequency 2x / week    PT Duration 12 weeks    PT Treatment/Interventions ADLs/Self Care Home Management;Aquatic Therapy;Cryotherapy;Electrical Stimulation;Iontophoresis 53m/ml Dexamethasone;Moist Heat;Ultrasound;Functional mobility training;Therapeutic activities;Therapeutic exercise;Neuromuscular re-education;Patient/family education;Manual techniques;Passive range of motion;Dry needling;Taping;Vasopneumatic Device    PT Next Visit Plan Progress per rehab protocol.           Patient will benefit from skilled therapeutic intervention in order to improve the following deficits and impairments:  Decreased range of motion, Increased fascial restricitons, Increased muscle spasms, Impaired UE functional use, Obesity, Decreased activity tolerance, Pain, Impaired flexibility, Improper body mechanics, Decreased mobility, Decreased strength, Increased edema, Postural dysfunction  Visit Diagnosis: Acute pain of left shoulder  Abnormal posture  Other symptoms and signs involving the musculoskeletal system     Problem List Patient Active  Problem List   Diagnosis Date Noted  . Aortic atherosclerosis ([place] 03/11/2020  . Squamous cell skin cancer 08/04/2019  . IFG (impaired fasting glucose) 08/04/2019  . Abnormal weight gain 11/27/2018  . Abdominal pain, chronic, epigastric 11/27/2018  . BMI 45.0-49.9, adult ([place] 10/04/2018  . Primary osteoarthritis of left hip 08/05/2018  . Low calcium levels 07/26/2018  . Family history of celiac disease 05/29/2018  . Precordial chest pain 03/06/2018  . Dyslipidemia 03/06/2018  . Adrenal mass ([place] 01/23/2018  . GERD (gastroesophageal reflux disease) 09/20/2017  . Heavy tobacco smoker 09/20/2017  . Adenoma of left adrenal gland 09/11/2017  . Diverticulosis of colon 09/11/2017  . Left nephrolithiasis 09/11/2017  . Hepatic steatosis 09/07/2017  . Status post left partial knee replacement 08/03/2016  . Rotator cuff tear, left 07/06/2016  . Trochanteric bursitis of left hip 03/31/2015  . Dependent personality disorder ([place] 12/03/2014  . Chronic pain syndrome 12/03/2014  . Myofascial pain 11/09/2014  . Dysthymic disorder 10/15/2014  . PTSD (post-traumatic stress disorder) 10/15/2014  . Acute pain of left knee 08/27/2014  . Primary osteoarthritis of both knees 12/21/2011  . B12 DEFICIENCY 06/21/2010  . Vitamin D deficiency 06/21/2010  . NECK PAIN, CHRONIC 06/21/2010  . VARICOSE VEINS, LOWER EXTREMITIES 05/17/2010  . Post-menopausal 05/17/2010  .  NUMBNESS, ARM 04/04/2010  . History of DVT (deep vein thrombosis) 01/13/2010  . SEIZURE DISORDER 01/13/2010  . Depression, major, recurrent, moderate ([place]) 05/16/2009  . Morbid obesity ([place]) 03/09/2009  . Anxiety state 03/09/2009  . CIGARETTE SMOKER 03/09/2009  . MULTIPLE SCLEROSIS, RELAPSING/REMITTING 03/09/2009  . Migraine without aura 03/09/2009  . ESSENTIAL HYPERTENSION, BENIGN 03/09/2009  . Lumbar degenerative disc disease 03/09/2009   Kerin Perna, PTA 03/16/20 1:06 PM  [facility] [place], [place], [address] Phone: [phone]   Fax:  [phone]  Name: Ashley Gomez MRN: [id number] Date of Birth: [birth year]  PHYSICAL THERAPY DISCHARGE SUMMARY  Visits from Start of Care: 4  Current functional level related to goals / functional outcomes: See progress note for discharge status    Remaining deficits: Unknown    Education / Equipment: HEP Plan: Patient agrees to discharge.  Patient goals were partially met. Patient is being discharged due to not returning since the last visit.  ?????    Celyn P. Helene Kelp PT, MPH 07/01/20 4:51 PM

## 2020-03-16 NOTE — Patient Instructions (Signed)
Access Code: [email or web address]: [email or web address]: 11/02/2021Prepared by: [place]  Supine Scapular Retraction - 2 x daily - 7 x weekly - 1 sets - 10 reps - 5-10 sec hold  Seated Shoulder Flexion Towel Slide at Table Top Full Range of Motion - 2 x daily - 7 x weekly - 1 sets - 5-10 reps - 10sec hold  Seated Shoulder External Rotation AAROM with Cane and Hand in Neutral - 2 x daily - 7 x weekly - 1 sets - 5-10 reps - 5-10 sec hold  Circular Shoulder Pendulum with Table Support - 3-4 x daily - 7 x weekly - 1 sets - 20-30 reps  Supine Isometric Shoulder Extension with Towel - 2 x daily - 7 x weekly - 1 sets - 10 reps - 5 hold  Supine Chest Stretch with Elbows Bent - 1 x daily - 7 x weekly - 1 sets - 2 reps - 20-30 hold  Supine Shoulder Abduction AAROM with Dowel - 1 x daily - 7 x weekly - 1 sets - 5 reps  Supine Shoulder Flexion Extension AAROM with Dowel - 1 x daily - 7 x weekly - 1 sets - 5 reps  Supine Shoulder Horizontal Abduction Adduction AAROM with Dowel - 1 x daily - 7 x weekly - 1 sets - 5 reps  Standing Isometric Shoulder External Rotation with Doorway - 2 x daily - 7 x weekly - 1 sets - 10 reps - 5 hold  Standing Isometric Shoulder Internal Rotation at Doorway - 2 x daily - 7 x weekly - 1 sets - 10 reps - 5 hold  Isometric Shoulder Flexion at Wall - 2 x daily - 7 x weekly - 1 sets - 10 reps - 5 hold  Standing Shoulder Extension with Dowel - 2 x daily - 7 x weekly - 1 sets - 10 reps

## 2020-03-18 ENCOUNTER — Encounter: Payer: Self-pay | Admitting: Family Medicine

## 2020-03-18 NOTE — Telephone Encounter (Signed)
What does the pharmacist recommend? I never saw anythiing in my basket

## 2020-03-18 NOTE — Telephone Encounter (Signed)
PEG 3350-[id number]-NaSulf (GOLYTELY) 227.1 g PACK    Sig: Take as directed.    Dispense:  1 each    Refill:  0   This was sent originally. Please advise.

## 2020-03-19 ENCOUNTER — Telehealth: Payer: Self-pay | Admitting: *Deleted

## 2020-03-19 MED ORDER — PEG-KCL-NACL-NASULF-NA ASC-C 100 G PO SOLR: 1.0000 | Freq: Once | ORAL | 0 refills | Status: AC

## 2020-03-19 NOTE — Telephone Encounter (Signed)
Called [facility] and spoke with Larose Kells, CPhT, and she states there were issues with getting medication in stock.   She recommends MOVIPREP, which they do have in stock. I have pended this to be sent.   Can you please just verify sig and send to pharmacy?  Thanks!

## 2020-03-19 NOTE — Telephone Encounter (Signed)
err

## 2020-03-22 ENCOUNTER — Ambulatory Visit (INDEPENDENT_AMBULATORY_CARE_PROVIDER_SITE_OTHER): Payer: Medicare Other | Admitting: Licensed Clinical Social Worker

## 2020-03-22 DIAGNOSIS — F4321 Adjustment disorder with depressed mood: Secondary | ICD-10-CM

## 2020-03-22 DIAGNOSIS — F411 Generalized anxiety disorder: Secondary | ICD-10-CM

## 2020-03-22 DIAGNOSIS — F331 Major depressive disorder, recurrent, moderate: Secondary | ICD-10-CM

## 2020-03-22 NOTE — Progress Notes (Signed)
  Therapist attempted to contact patient by phone for session and left a message. Session is a no show

## 2020-03-24 ENCOUNTER — Encounter: Payer: Medicare Other | Admitting: Rehabilitative and Restorative Service Providers"

## 2020-04-01 ENCOUNTER — Encounter: Payer: Self-pay | Admitting: Family Medicine

## 2020-04-01 ENCOUNTER — Encounter: Payer: Medicare Other | Admitting: Physical Therapy

## 2020-04-07 ENCOUNTER — Encounter: Payer: Medicare Other | Admitting: Physical Therapy

## 2020-04-09 DIAGNOSIS — G8929 Other chronic pain: Secondary | ICD-10-CM | POA: Diagnosis not present

## 2020-04-09 DIAGNOSIS — M545 Low back pain, unspecified: Secondary | ICD-10-CM | POA: Diagnosis not present

## 2020-04-09 DIAGNOSIS — Z79899 Other long term (current) drug therapy: Secondary | ICD-10-CM | POA: Diagnosis not present

## 2020-04-09 DIAGNOSIS — F17210 Nicotine dependence, cigarettes, uncomplicated: Secondary | ICD-10-CM | POA: Diagnosis not present

## 2020-04-09 DIAGNOSIS — Z79891 Long term (current) use of opiate analgesic: Secondary | ICD-10-CM | POA: Diagnosis not present

## 2020-04-14 ENCOUNTER — Encounter: Payer: Self-pay | Admitting: Family Medicine

## 2020-04-14 MED ORDER — RIVAROXABAN 20 MG PO TABS
20.0000 mg | ORAL_TABLET | Freq: Every day | ORAL | 1 refills | Status: DC
Start: 2020-04-14 — End: 2020-10-04

## 2020-04-19 ENCOUNTER — Encounter: Payer: Self-pay | Admitting: Family Medicine

## 2020-04-20 ENCOUNTER — Other Ambulatory Visit: Payer: Self-pay

## 2020-04-20 ENCOUNTER — Ambulatory Visit (INDEPENDENT_AMBULATORY_CARE_PROVIDER_SITE_OTHER): Payer: Medicare Other | Admitting: Family Medicine

## 2020-04-20 VITALS — BP 137/74 | HR 81

## 2020-04-20 DIAGNOSIS — R109 Unspecified abdominal pain: Secondary | ICD-10-CM | POA: Diagnosis not present

## 2020-04-20 DIAGNOSIS — K625 Hemorrhage of anus and rectum: Secondary | ICD-10-CM

## 2020-04-20 DIAGNOSIS — S90851A Superficial foreign body, right foot, initial encounter: Secondary | ICD-10-CM

## 2020-04-20 NOTE — Progress Notes (Signed)
Established Patient Office Visit  Subjective:  Patient ID: Ashley Gomez, female    DOB: 1970-11-18  Age: 49 y.o. MRN: [id number]  CC:  Chief Complaint  Patient presents with  . Abdominal Pain    HPI SWEDEN LESURE presents for blood in her stool.  She says that about 3 weeks ago she had had a bowel movement and then when she looked in the toilet she noticed red blood on most like she was having her.  She said that she has not noticed any blood in the urine or vaginal area.  She knows she has a hemorrhoid from when she was pregnant.  But has not had active problems with hemorrhoids as far as pain swelling inflammation etc.  She said a couple times since then she woke up with abdominal pain in the middle the night and then passed blood rectally without a bowel movement.  She also complains of some pain on her right abdominal side that she describes as a "catch".  She does feel a little nauseated with it and says sometimes it even radiates around to her right flank.  She says her abdomen feels tender but mostly in the middle and to the right side of her abdomen.  Last time she saw blood was November 25.  She had been taking diclofenac given to her by the orthopedist in addition to her Xarelto.  She does take Pepcid.  So me to look at the bottom of her foot today.  She says for a week or more she is felt like something was stuck in her foot.  Does not remember stepping on a glass etc.  Past Medical History:  Diagnosis Date  . Anxiety   . Arthritis   . CVA (cerebral infarction)    Old CVA on MRI brain  . Depression   . DVT (deep venous thrombosis) ([place]) 03/75/4360   L basilic vein   . Facet hypertrophy of lumbar region    MRI 2007  . Fibromyalgia   . GERD (gastroesophageal reflux disease)   . Hypertension   . Migraines   . MS (multiple sclerosis) ([place])   . Neuromuscular disorder ([place])   . Obesity   . Pre-diabetes   . PTSD (post-traumatic stress disorder)   . Seizures ([place])  2011   no seizure since onset  . Smoking   . Stroke [facility])    TIA, >8years ago    Past Surgical History:  Procedure Laterality Date  . CHOLECYSTECTOMY  10/2017  . CHONDROPLASTY Left 12/30/2014   Procedure: CHONDROPLASTY;  Surgeon: Leandrew Koyanagi, MD;  Location: [place];  Service: Orthopedics;  Laterality: Left;  . KNEE ARTHROSCOPY Left 12/30/2014   Procedure: LEFT KNEE ARTHROSCOPY WITH   CHONDROPLASTY;  Surgeon: Leandrew Koyanagi, MD;  Location: [place];  Service: Orthopedics;  Laterality: Left;  . PARTIAL KNEE ARTHROPLASTY Left 08/03/2016   Procedure: LEFT UNICOMPARTMENTAL KNEE ARTHROPLASTY;  Surgeon: Leandrew Koyanagi, MD;  Location: [place];  Service: Orthopedics;  Laterality: Left;  . SHOULDER ARTHROSCOPY WITH BICEPSTENOTOMY Left 01/15/2020   Procedure: SHOULDER ARTHROSCOPY WITH BICEPSTENOTOMY;  Surgeon: Hiram Gash, MD;  Location: [place];  Service: Orthopedics;  Laterality: Left;  . SHOULDER ARTHROSCOPY WITH DISTAL CLAVICLE RESECTION Left 01/15/2020   Procedure: SHOULDER ARTHROSCOPY WITH DISTAL CLAVICULECTOMY;  Surgeon: Hiram Gash, MD;  Location: [place];  Service: Orthopedics;  Laterality: Left;  . SHOULDER ARTHROSCOPY WITH ROTATOR CUFF REPAIR AND  SUBACROMIAL DECOMPRESSION Left 01/15/2020   Procedure: SHOULDER ARTHROSCOPY DEBRIDMENT WITH ROTATOR CUFF REPAIR, SUBACROMIAL DECOMPRESSION WITH ACROMIPLASTY, AND BICEP TENOTOMY;  Surgeon: Hiram Gash, MD;  Location: [place];  Service: Orthopedics;  Laterality: Left;  . TONSILLECTOMY    . TUBAL LIGATION  1992    Family History  Problem Relation Age of Onset  . Cancer Mother 56       melanoma  . Hypertension Sister   . Heart disease Sister        valve disease  . Depression Sister   . Diabetes Sister   . Sjogren's syndrome Sister     Social History   Socioeconomic History  . Marital status: Single    Spouse name: Not on file  . Number of children: Not on  file  . Years of education: Not on file  . Highest education level: Not on file  Occupational History  . Not on file  Tobacco Use  . Smoking status: Current Every Day Smoker    Packs/day: 1.00    Years: 25.00    Pack years: 25.00    Types: Cigarettes  . Smokeless tobacco: Never Used  . Tobacco comment: advised to d/c by 3 cigs per week.  Substance and Sexual Activity  . Alcohol use: No    Alcohol/week: 0.0 standard drinks  . Drug use: No  . Sexual activity: Yes    Birth control/protection: None, Post-menopausal  Other Topics Concern  . Not on file  Social History Narrative  . Not on file   Social Determinants of Health   Financial Resource Strain:   . Difficulty of Paying Living Expenses: Not on file  Food Insecurity:   . Worried About Charity fundraiser in the Last Year: Not on file  . Ran Out of Food in the Last Year: Not on file  Transportation Needs:   . Lack of Transportation (Medical): Not on file  . Lack of Transportation (Non-Medical): Not on file  Physical Activity:   . Days of Exercise per Week: Not on file  . Minutes of Exercise per Session: Not on file  Stress:   . Feeling of Stress : Not on file  Social Connections:   . Frequency of Communication with Friends and Family: Not on file  . Frequency of Social Gatherings with Friends and Family: Not on file  . Attends Religious Services: Not on file  . Active Member of Clubs or Organizations: Not on file  . Attends Archivist Meetings: Not on file  . Marital Status: Not on file  Intimate Partner Violence:   . Fear of Current or Ex-Partner: Not on file  . Emotionally Abused: Not on file  . Physically Abused: Not on file  . Sexually Abused: Not on file    Outpatient Medications Prior to Visit  Medication Sig Dispense Refill  . AIMOVIG 140 MG/ML SOAJ INJECT 140 MG AS DIRECTED EVERY 30 (THIRTY) DAYS. 1 mL 2  . AMBULATORY NON FORMULARY MEDICATION testosterone    . ammonium lactate (LAC-HYDRIN) 12  % lotion See admin instructions.    . busPIRone (BUSPAR) 7.5 MG tablet TAKE 1 TABLET BY MOUTH TWICE A DAY AS NEEDED 60 tablet 0  . clonazePAM (KLONOPIN) 1 MG tablet TAKE 1 TABLET TWICE A DAY AS NEEDED ANXIETY MUST LAST 30 DAYS 45 tablet 1  . cyclobenzaprine (FLEXERIL) 10 MG tablet Take 10 mg by mouth 2 (two) times daily as needed.    . DULoxetine (CYMBALTA) 30  MG capsule TAKE 1 CAPSULE BY MOUTH DAILY ALONG WITH 60MG 30 capsule 2  . DULoxetine (CYMBALTA) 60 MG capsule TAKE 1 CAPSULE BY MOUTH EVERY DAY 30 capsule 2  . famotidine (PEPCID) 20 MG tablet Take 1 tablet (20 mg total) by mouth daily. 90 tablet 3  . Galcanezumab-gnlm (EMGALITY) 120 MG/ML SOAJ 240 [place] x 1, then 114m [place] q month. 2 mL 3  . HYDROcodone-acetaminophen (NORCO) 10-325 MG tablet Take 1 tablet by mouth every 6 (six) hours as needed for pain. for pain  0  . metoprolol succinate (TOPROL-XL) 25 MG 24 hr tablet Take 1 tablet (25 mg total) by mouth daily. 30 tablet 2  . NARCAN 4 MG/0.1ML LIQD nasal spray kit USE AS DIRECTED    . omeprazole (PRILOSEC) 40 MG capsule TAKE 1 CAPSULE BY MOUTH EVERY DAY 90 capsule 3  . rivaroxaban (XARELTO) 20 MG TABS tablet Take 1 tablet (20 mg total) by mouth daily with supper. 90 tablet 1  . metFORMIN (GLUCOPHAGE-XR) 500 MG 24 hr tablet Take 1 tablet (500 mg total) by mouth daily with breakfast. 90 tablet 1   No facility-administered medications prior to visit.    Allergies  Allergen Reactions  . Ace Inhibitors     REACTION: cough  . Atenolol Other (See Comments)    Bradycardia  . Celebrex [Celecoxib] Other (See Comments)    Swelling in extremities  . Celexa [Citalopram Hydrobromide] Other (See Comments)    Dizziness Tolerated Lexapro   . Codeine Nausea Only  . Nitroglycerin Other (See Comments)    Drop in BP  . Phenytoin Swelling  . Sertraline Other (See Comments)    unknown  . Sumatriptan Nausea Only  . Tizanidine Other (See Comments)    Keep her awake.   . Topamax [Topiramate] Other  (See Comments)    She reports that this makes her forgetful and causes her to studder  . Triamcinolone     Steroid flare after joint injection, use other steroid for injections    ROS Review of Systems    Objective:    Physical Exam Vitals reviewed.  Constitutional:      Appearance: She is well-developed.  HENT:     Head: Normocephalic and atraumatic.  Eyes:     Conjunctiva/sclera: Conjunctivae normal.  Cardiovascular:     Rate and Rhythm: Normal rate.  Pulmonary:     Effort: Pulmonary effort is normal.  Abdominal:     Tenderness: There is abdominal tenderness in the right upper quadrant, right lower quadrant and epigastric area. There is no guarding or rebound. Negative signs include Murphy's sign.  Genitourinary:    Rectum: Normal. No mass or tenderness.  Skin:    General: Skin is dry.     Coloration: Skin is not pale.  Neurological:     Mental Status: She is alert and oriented to person, place, and time.  Psychiatric:        Behavior: Behavior normal.    Anoscope used to visualize the rectal area I did not see any active bleeding or areas of inflammation.  No mass with digital rectal exam.  BP 137/74   Pulse 81   LMP 12/28/2014   SpO2 97%  Wt Readings from Last 3 Encounters:  03/11/20 300 lb (136.1 kg)  01/15/20 293 lb 3.4 oz (133 kg)  12/05/19 (!) 291 lb (132 kg)     Health Maintenance Due  Topic Date Due  . Hepatitis C Screening  Never done    There are  no preventive care reminders to display for this patient.  Lab Results  Component Value Date   TSH 1.10 08/04/2019   Lab Results  Component Value Date   WBC 8.1 10/02/2019   HGB 12.1 10/02/2019   HCT 38.2 10/02/2019   MCV 81.3 10/02/2019   PLT 363 10/02/2019   Lab Results  Component Value Date   NA 138 01/12/2020   K 4.2 01/12/2020   CO2 26 01/12/2020   GLUCOSE 99 01/12/2020   BUN 10 01/12/2020   CREATININE 0.74 01/12/2020   BILITOT 0.2 10/03/2019   ALKPHOS 98 11/29/2016   AST 14  10/03/2019   ALT 10 10/03/2019   PROT 5.9 (L) 10/03/2019   ALBUMIN 3.6 11/29/2016   CALCIUM 9.1 01/12/2020   ANIONGAP 10 01/12/2020   Lab Results  Component Value Date   CHOL 163 08/04/2019   Lab Results  Component Value Date   HDL 42 (L) 08/04/2019   Lab Results  Component Value Date   LDLCALC 98 08/04/2019   Lab Results  Component Value Date   TRIG 133 08/04/2019   Lab Results  Component Value Date   CHOLHDL 3.9 08/04/2019   Lab Results  Component Value Date   HGBA1C 5.9 (A) 03/11/2020      Assessment & Plan:   Problem List Items Addressed This Visit    None    Visit Diagnoses    Rectal bleeding    -  Primary   Relevant Orders   Ambulatory referral to Gastroenterology   Right lateral abdominal pain       Relevant Orders   Ambulatory referral to Gastroenterology   Foreign body in right foot, initial encounter       Relevant Orders   Wound culture (Completed)     Rectal bleeding-did not find a source of bleeding on exam though she has not actually seen blood in almost a week at this point but it is quite concerning that she was passing blood without a bowel movement so would like to refer her to GI for further work-up.  She has not had a screening colonoscopy so that likely needs to be done as well.  Told her stop the diclofenac and not take that with her blood thinner.  Right mid abdominal pain unclear etiology as well.  No red flag symptoms. she did have a CAT scan about 6 months ago that did not show anything concerning   Foreign body in the right foot-I did take a sterile blade and just abraded some of the dead skin off a palpable area in the right mid foot on the bottom.  As I was debriding some of the dead tissue a small amount of pus was expressed.  This was cultured.  I was unable to express any type of foreign bodies such as glass or wood.  Encouraged her to do salt water soaks at home and if not improving I be happy to see her back so that we can numb  the area and look further for foreign body.   No orders of the defined types were placed in this encounter.   Follow-up: No follow-ups on file.   I spent 35 minutes on the day of the encounter to include pre-visit record review, face-to-face time with the patient and post visit ordering of test.   Beatrice Lecher, MD

## 2020-04-20 NOTE — Progress Notes (Incomplete)
Established Patient Office Visit  Subjective:  Patient ID: Ashley Gomez, female    DOB: 1970/10/13  Age: 49 y.o. MRN: [id number]  CC: No chief complaint on file.   HPI Ashley Gomez presents for persistent mid and right-sided abdominal pain.  Sometimes it so severe that it radiates around to her flank.  It is been coming and going but more recently she is also had several episodes of blood with stool and even without stool.  Though she also has been taking some diclofenac that the orthopedist given her and she is already on a blood thinner she did not realize that she could not take the diclofenac.  She knew she can take ibuprofen or Aleve.  Past Medical History:  Diagnosis Date  . Anxiety   . Arthritis   . CVA (cerebral infarction)    Old CVA on MRI brain  . Depression   . DVT (deep venous thrombosis) ([place]) 26/20/3559   L basilic vein   . Facet hypertrophy of lumbar region    MRI 2007  . Fibromyalgia   . GERD (gastroesophageal reflux disease)   . Hypertension   . Migraines   . MS (multiple sclerosis) ([place])   . Neuromuscular disorder ([place])   . Obesity   . Pre-diabetes   . PTSD (post-traumatic stress disorder)   . Seizures ([place]) 2011   no seizure since onset  . Smoking   . Stroke [facility])    TIA, >8years ago    Past Surgical History:  Procedure Laterality Date  . CHOLECYSTECTOMY  10/2017  . CHONDROPLASTY Left 12/30/2014   Procedure: CHONDROPLASTY;  Surgeon: Leandrew Koyanagi, MD;  Location: [place];  Service: Orthopedics;  Laterality: Left;  . KNEE ARTHROSCOPY Left 12/30/2014   Procedure: LEFT KNEE ARTHROSCOPY WITH   CHONDROPLASTY;  Surgeon: Leandrew Koyanagi, MD;  Location: [place];  Service: Orthopedics;  Laterality: Left;  . PARTIAL KNEE ARTHROPLASTY Left 08/03/2016   Procedure: LEFT UNICOMPARTMENTAL KNEE ARTHROPLASTY;  Surgeon: Leandrew Koyanagi, MD;  Location: [place];  Service: Orthopedics;  Laterality: Left;  . SHOULDER ARTHROSCOPY WITH  BICEPSTENOTOMY Left 01/15/2020   Procedure: SHOULDER ARTHROSCOPY WITH BICEPSTENOTOMY;  Surgeon: Hiram Gash, MD;  Location: [place];  Service: Orthopedics;  Laterality: Left;  . SHOULDER ARTHROSCOPY WITH DISTAL CLAVICLE RESECTION Left 01/15/2020   Procedure: SHOULDER ARTHROSCOPY WITH DISTAL CLAVICULECTOMY;  Surgeon: Hiram Gash, MD;  Location: [place];  Service: Orthopedics;  Laterality: Left;  . SHOULDER ARTHROSCOPY WITH ROTATOR CUFF REPAIR AND SUBACROMIAL DECOMPRESSION Left 01/15/2020   Procedure: SHOULDER ARTHROSCOPY DEBRIDMENT WITH ROTATOR CUFF REPAIR, SUBACROMIAL DECOMPRESSION WITH ACROMIPLASTY, AND BICEP TENOTOMY;  Surgeon: Hiram Gash, MD;  Location: [place];  Service: Orthopedics;  Laterality: Left;  . TONSILLECTOMY    . TUBAL LIGATION  1992    Family History  Problem Relation Age of Onset  . Cancer Mother 43       melanoma  . Hypertension Sister   . Heart disease Sister        valve disease  . Depression Sister   . Diabetes Sister   . Sjogren's syndrome Sister     Social History   Socioeconomic History  . Marital status: Single    Spouse name: Not on file  . Number of children: Not on file  . Years of education: Not on file  . Highest education level: Not on file  Occupational History  . Not  on file  Tobacco Use  . Smoking status: Current Every Day Smoker    Packs/day: 1.00    Years: 25.00    Pack years: 25.00    Types: Cigarettes  . Smokeless tobacco: Never Used  . Tobacco comment: advised to d/c by 3 cigs per week.  Substance and Sexual Activity  . Alcohol use: No    Alcohol/week: 0.0 standard drinks  . Drug use: No  . Sexual activity: Yes    Birth control/protection: None, Post-menopausal  Other Topics Concern  . Not on file  Social History Narrative  . Not on file   Social Determinants of Health   Financial Resource Strain:   . Difficulty of Paying Living Expenses: Not on file  Food Insecurity:    . Worried About Charity fundraiser in the Last Year: Not on file  . Ran Out of Food in the Last Year: Not on file  Transportation Needs:   . Lack of Transportation (Medical): Not on file  . Lack of Transportation (Non-Medical): Not on file  Physical Activity:   . Days of Exercise per Week: Not on file  . Minutes of Exercise per Session: Not on file  Stress:   . Feeling of Stress : Not on file  Social Connections:   . Frequency of Communication with Friends and Family: Not on file  . Frequency of Social Gatherings with Friends and Family: Not on file  . Attends Religious Services: Not on file  . Active Member of Clubs or Organizations: Not on file  . Attends Archivist Meetings: Not on file  . Marital Status: Not on file  Intimate Partner Violence:   . Fear of Current or Ex-Partner: Not on file  . Emotionally Abused: Not on file  . Physically Abused: Not on file  . Sexually Abused: Not on file    Outpatient Medications Prior to Visit  Medication Sig Dispense Refill  . AIMOVIG 140 MG/ML SOAJ INJECT 140 MG AS DIRECTED EVERY 30 (THIRTY) DAYS. 1 mL 2  . AMBULATORY NON FORMULARY MEDICATION testosterone    . ammonium lactate (LAC-HYDRIN) 12 % lotion See admin instructions.    . busPIRone (BUSPAR) 7.5 MG tablet TAKE 1 TABLET BY MOUTH TWICE A DAY AS NEEDED 60 tablet 0  . clonazePAM (KLONOPIN) 1 MG tablet TAKE 1 TABLET TWICE A DAY AS NEEDED ANXIETY MUST LAST 30 DAYS 45 tablet 1  . cyclobenzaprine (FLEXERIL) 10 MG tablet Take 10 mg by mouth 2 (two) times daily as needed.    . DULoxetine (CYMBALTA) 30 MG capsule TAKE 1 CAPSULE BY MOUTH DAILY ALONG WITH 60MG 30 capsule 2  . DULoxetine (CYMBALTA) 60 MG capsule TAKE 1 CAPSULE BY MOUTH EVERY DAY 30 capsule 2  . famotidine (PEPCID) 20 MG tablet Take 1 tablet (20 mg total) by mouth daily. 90 tablet 3  . Galcanezumab-gnlm (EMGALITY) 120 MG/ML SOAJ 240 [place] x 1, then 160m [place] q month. 2 mL 3  . HYDROcodone-acetaminophen (NORCO) 10-325 MG  tablet Take 1 tablet by mouth every 6 (six) hours as needed for pain. for pain  0  . metFORMIN (GLUCOPHAGE-XR) 500 MG 24 hr tablet Take 1 tablet (500 mg total) by mouth daily with breakfast. 90 tablet 1  . metoprolol succinate (TOPROL-XL) 25 MG 24 hr tablet Take 1 tablet (25 mg total) by mouth daily. 30 tablet 2  . NARCAN 4 MG/0.1ML LIQD nasal spray kit USE AS DIRECTED    . omeprazole (PRILOSEC) 40 MG capsule TAKE  1 CAPSULE BY MOUTH EVERY DAY 90 capsule 3  . rivaroxaban (XARELTO) 20 MG TABS tablet Take 1 tablet (20 mg total) by mouth daily with supper. 90 tablet 1   No facility-administered medications prior to visit.    Allergies  Allergen Reactions  . Ace Inhibitors     REACTION: cough  . Atenolol Other (See Comments)    Bradycardia  . Celebrex [Celecoxib] Other (See Comments)    Swelling in extremities  . Celexa [Citalopram Hydrobromide] Other (See Comments)    Dizziness Tolerated Lexapro   . Codeine Nausea Only  . Nitroglycerin Other (See Comments)    Drop in BP  . Phenytoin Swelling  . Sertraline Other (See Comments)    unknown  . Sumatriptan Nausea Only  . Tizanidine Other (See Comments)    Keep her awake.   . Topamax [Topiramate] Other (See Comments)    She reports that this makes her forgetful and causes her to studder  . Triamcinolone     Steroid flare after joint injection, use other steroid for injections    ROS Review of Systems    Objective:    Physical Exam  LMP 12/28/2014  Wt Readings from Last 3 Encounters:  03/11/20 300 lb (136.1 kg)  01/15/20 293 lb 3.4 oz (133 kg)  12/05/19 (!) 291 lb (132 kg)     Health Maintenance Due  Topic Date Due  . Hepatitis C Screening  Never done    There are no preventive care reminders to display for this patient.  Lab Results  Component Value Date   TSH 1.10 08/04/2019   Lab Results  Component Value Date   WBC 8.1 10/02/2019   HGB 12.1 10/02/2019   HCT 38.2 10/02/2019   MCV 81.3 10/02/2019   PLT 363  10/02/2019   Lab Results  Component Value Date   NA 138 01/12/2020   K 4.2 01/12/2020   CO2 26 01/12/2020   GLUCOSE 99 01/12/2020   BUN 10 01/12/2020   CREATININE 0.74 01/12/2020   BILITOT 0.2 10/03/2019   ALKPHOS 98 11/29/2016   AST 14 10/03/2019   ALT 10 10/03/2019   PROT 5.9 (L) 10/03/2019   ALBUMIN 3.6 11/29/2016   CALCIUM 9.1 01/12/2020   ANIONGAP 10 01/12/2020   Lab Results  Component Value Date   CHOL 163 08/04/2019   Lab Results  Component Value Date   HDL 42 (L) 08/04/2019   Lab Results  Component Value Date   LDLCALC 98 08/04/2019   Lab Results  Component Value Date   TRIG 133 08/04/2019   Lab Results  Component Value Date   CHOLHDL 3.9 08/04/2019   Lab Results  Component Value Date   HGBA1C 5.9 (A) 03/11/2020      Assessment & Plan:   Problem List Items Addressed This Visit    None    Visit Diagnoses    Rectal bleeding    -  Primary   Right lateral abdominal pain        Rectal bleeding-suspect that internal hemorrhoid I was not able to find any active bleeding on exam though she has not seen any blood in about a week.  But she was taking a nap of the neck and her rale toe.  Sided abdominal pain-we will go ahead and refer to GI she did have a CAT scan about 6 months ago that did not show anything concerning but she is over 48 and has not had a colonoscopy.  Recommend further evaluation by  GI as well as colonoscopy she is never had one for screening purposes.  No orders of the defined types were placed in this encounter.   Follow-up: No follow-ups on file.    Beatrice Lecher, MD

## 2020-04-21 ENCOUNTER — Encounter: Payer: Self-pay | Admitting: Family Medicine

## 2020-04-23 LAB — WOUND CULTURE
MICRO NUMBER:: 11289358
RESULT:: NO GROWTH
SPECIMEN QUALITY:: ADEQUATE

## 2020-04-29 ENCOUNTER — Other Ambulatory Visit: Payer: Self-pay

## 2020-04-29 ENCOUNTER — Telehealth (INDEPENDENT_AMBULATORY_CARE_PROVIDER_SITE_OTHER): Payer: Medicare Other | Admitting: Psychiatry

## 2020-04-29 ENCOUNTER — Ambulatory Visit (INDEPENDENT_AMBULATORY_CARE_PROVIDER_SITE_OTHER): Payer: Medicare Other

## 2020-04-29 ENCOUNTER — Encounter (HOSPITAL_COMMUNITY): Payer: Self-pay | Admitting: Psychiatry

## 2020-04-29 DIAGNOSIS — F331 Major depressive disorder, recurrent, moderate: Secondary | ICD-10-CM

## 2020-04-29 DIAGNOSIS — F411 Generalized anxiety disorder: Secondary | ICD-10-CM | POA: Diagnosis not present

## 2020-04-29 DIAGNOSIS — Z1231 Encounter for screening mammogram for malignant neoplasm of breast: Secondary | ICD-10-CM

## 2020-04-29 MED ORDER — DULOXETINE HCL 60 MG PO CPEP: ORAL_CAPSULE | ORAL | 2 refills | Status: DC

## 2020-04-29 MED ORDER — BUSPIRONE HCL 7.5 MG PO TABS: ORAL_TABLET | ORAL | 0 refills | Status: DC

## 2020-04-29 MED ORDER — DULOXETINE HCL 30 MG PO CPEP
ORAL_CAPSULE | ORAL | 2 refills | Status: DC
Start: 2020-04-29 — End: 2020-06-25
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
## 2020-05-04 ENCOUNTER — Encounter: Payer: Self-pay | Admitting: Family Medicine

## 2020-05-04 DIAGNOSIS — G8929 Other chronic pain: Secondary | ICD-10-CM | POA: Diagnosis not present

## 2020-05-04 DIAGNOSIS — Z79899 Other long term (current) drug therapy: Secondary | ICD-10-CM | POA: Diagnosis not present

## 2020-05-04 DIAGNOSIS — Z79891 Long term (current) use of opiate analgesic: Secondary | ICD-10-CM | POA: Diagnosis not present

## 2020-05-04 DIAGNOSIS — M545 Low back pain, unspecified: Secondary | ICD-10-CM | POA: Diagnosis not present

## 2020-05-05 ENCOUNTER — Encounter: Payer: Self-pay | Admitting: Family Medicine

## 2020-05-05 ENCOUNTER — Other Ambulatory Visit: Payer: Self-pay | Admitting: Family Medicine

## 2020-05-05 DIAGNOSIS — F411 Generalized anxiety disorder: Secondary | ICD-10-CM

## 2020-05-12 ENCOUNTER — Encounter: Payer: Self-pay | Admitting: Family Medicine

## 2020-05-12 ENCOUNTER — Other Ambulatory Visit: Payer: Self-pay | Admitting: Family Medicine

## 2020-05-12 DIAGNOSIS — F411 Generalized anxiety disorder: Secondary | ICD-10-CM

## 2020-05-13 MED ORDER — CLONAZEPAM 1 MG PO TABS: ORAL_TABLET | ORAL | 1 refills | Status: DC

## 2020-05-25 DIAGNOSIS — I10 Essential (primary) hypertension: Secondary | ICD-10-CM | POA: Diagnosis not present

## 2020-05-25 DIAGNOSIS — R7303 Prediabetes: Secondary | ICD-10-CM | POA: Diagnosis not present

## 2020-05-31 ENCOUNTER — Ambulatory Visit (INDEPENDENT_AMBULATORY_CARE_PROVIDER_SITE_OTHER): Payer: Medicare Other | Admitting: Licensed Clinical Social Worker

## 2020-05-31 DIAGNOSIS — F331 Major depressive disorder, recurrent, moderate: Secondary | ICD-10-CM

## 2020-05-31 DIAGNOSIS — F411 Generalized anxiety disorder: Secondary | ICD-10-CM

## 2020-05-31 NOTE — Progress Notes (Signed)
Therapist contacted patient by phone and she did not respond. Session is a no show  

## 2020-06-01 DIAGNOSIS — R1011 Right upper quadrant pain: Secondary | ICD-10-CM | POA: Diagnosis not present

## 2020-06-01 DIAGNOSIS — Z1211 Encounter for screening for malignant neoplasm of colon: Secondary | ICD-10-CM | POA: Diagnosis not present

## 2020-06-01 DIAGNOSIS — K219 Gastro-esophageal reflux disease without esophagitis: Secondary | ICD-10-CM | POA: Diagnosis not present

## 2020-06-01 DIAGNOSIS — R1013 Epigastric pain: Secondary | ICD-10-CM | POA: Diagnosis not present

## 2020-06-02 ENCOUNTER — Other Ambulatory Visit (HOSPITAL_COMMUNITY): Payer: Self-pay | Admitting: Psychiatry

## 2020-06-02 ENCOUNTER — Other Ambulatory Visit: Payer: Self-pay | Admitting: Family Medicine

## 2020-06-02 DIAGNOSIS — I10 Essential (primary) hypertension: Secondary | ICD-10-CM | POA: Diagnosis not present

## 2020-06-02 DIAGNOSIS — Z79891 Long term (current) use of opiate analgesic: Secondary | ICD-10-CM | POA: Diagnosis not present

## 2020-06-02 DIAGNOSIS — G35 Multiple sclerosis: Secondary | ICD-10-CM | POA: Diagnosis not present

## 2020-06-02 DIAGNOSIS — M545 Low back pain, unspecified: Secondary | ICD-10-CM | POA: Diagnosis not present

## 2020-06-02 DIAGNOSIS — Z79899 Other long term (current) drug therapy: Secondary | ICD-10-CM | POA: Diagnosis not present

## 2020-06-02 DIAGNOSIS — G8929 Other chronic pain: Secondary | ICD-10-CM | POA: Diagnosis not present

## 2020-06-04 ENCOUNTER — Other Ambulatory Visit: Payer: Self-pay | Admitting: Family Medicine

## 2020-06-11 ENCOUNTER — Encounter: Payer: Self-pay | Admitting: Family Medicine

## 2020-06-11 ENCOUNTER — Other Ambulatory Visit: Payer: Self-pay

## 2020-06-11 ENCOUNTER — Ambulatory Visit (INDEPENDENT_AMBULATORY_CARE_PROVIDER_SITE_OTHER): Payer: Medicare Other | Admitting: Family Medicine

## 2020-06-11 ENCOUNTER — Other Ambulatory Visit: Payer: Self-pay | Admitting: Family Medicine

## 2020-06-11 VITALS — BP 132/64 | HR 81 | Ht 66.0 in | Wt 291.0 lb

## 2020-06-11 DIAGNOSIS — F411 Generalized anxiety disorder: Secondary | ICD-10-CM

## 2020-06-11 DIAGNOSIS — G43019 Migraine without aura, intractable, without status migrainosus: Secondary | ICD-10-CM

## 2020-06-11 DIAGNOSIS — G8929 Other chronic pain: Secondary | ICD-10-CM | POA: Diagnosis not present

## 2020-06-11 DIAGNOSIS — R1013 Epigastric pain: Secondary | ICD-10-CM

## 2020-06-11 DIAGNOSIS — R7301 Impaired fasting glucose: Secondary | ICD-10-CM | POA: Diagnosis not present

## 2020-06-11 LAB — POCT GLYCOSYLATED HEMOGLOBIN (HGB A1C): Hemoglobin A1C: 5.9 % — AB (ref 4.0–5.6)

## 2020-06-11 MED ORDER — KETOROLAC TROMETHAMINE 60 MG/2ML IM SOLN
60.0000 mg | Freq: Once | INTRAMUSCULAR | Status: AC
  Administered 2020-06-11: 60 mg via INTRAMUSCULAR

## 2020-06-11 MED ORDER — ELETRIPTAN HYDROBROMIDE 40 MG PO TABS: 40.0000 mg | ORAL_TABLET | ORAL | 0 refills | Status: DC | PRN

## 2020-06-11 MED ORDER — CLONAZEPAM 1 MG PO TABS: ORAL_TABLET | ORAL | 1 refills | Status: DC

## 2020-06-11 NOTE — Assessment & Plan Note (Signed)
Did refill her clonazepam today.

## 2020-06-11 NOTE — Telephone Encounter (Signed)
Pt advised to call her insurance company to see what is covered under her plan. She said that she would and let us know.

## 2020-06-11 NOTE — Assessment & Plan Note (Addendum)
Scheduled for colonoscopy and endoscopy in March.

## 2020-06-11 NOTE — Assessment & Plan Note (Signed)
A1c is stable today.  Continue to work on healthy diet and regular exercise she is actually lost about 8 pounds since she was last here which is fantastic.  Lab Results  Component Value Date   HGBA1C 5.9 (A) 06/11/2020

## 2020-06-11 NOTE — Progress Notes (Signed)
Established Patient Office Visit  Subjective:  Patient ID: Ashley Gomez, female    DOB: 04/12/1971  Age: 50 y.o. MRN: [id number]  CC:  Chief Complaint  Patient presents with  . Migraine    HPI Ashley Gomez presents for   F/u Migraines - at visit in October we decided to stop Aimovig feeling it wasn't working.  Decided to try Emgality.  So far she has done okay on it she does not feel like it is necessarily better than the Aimovig but is not really worse she said it has been a little bit difficult to tell if it really is more effective or not.  Actually has a migraine today which she has had for a couple of days.  She has been getting some mild relief with Tylenol.  But says when she is upright for about 30 minutes it just really increases in intensity it is mostly over the top of her head and nasal sinuses and nasal bridge.  Impaired fasting glucose-no increased thirst or urination. No symptoms consistent with hypoglycemia.  Ports that she has a lot of sweats and feeling hot at times.  Though it is not new.  He did follow-up with GI for the GI bleeding and abdominal pain she says now it feels more like epigastric pain but she is off all NSAIDs and the bleeding has resolved she is scheduled for colonoscopy and endoscopy in early March.  Past Medical History:  Diagnosis Date  . Anxiety   . Arthritis   . CVA (cerebral infarction)    Old CVA on MRI brain  . Depression   . DVT (deep venous thrombosis) ([place]) 01/77/9390   L basilic vein   . Facet hypertrophy of lumbar region    MRI 2007  . Fibromyalgia   . GERD (gastroesophageal reflux disease)   . Hypertension   . Migraines   . MS (multiple sclerosis) ([place])   . Neuromuscular disorder ([place])   . Obesity   . Pre-diabetes   . PTSD (post-traumatic stress disorder)   . Seizures ([place]) 2011   no seizure since onset  . Smoking   . Stroke [facility])    TIA, >8years ago    Past Surgical History:  Procedure Laterality Date  .  CHOLECYSTECTOMY  10/2017  . CHONDROPLASTY Left 12/30/2014   Procedure: CHONDROPLASTY;  Surgeon: Leandrew Koyanagi, MD;  Location: [place];  Service: Orthopedics;  Laterality: Left;  . KNEE ARTHROSCOPY Left 12/30/2014   Procedure: LEFT KNEE ARTHROSCOPY WITH   CHONDROPLASTY;  Surgeon: Leandrew Koyanagi, MD;  Location: [place];  Service: Orthopedics;  Laterality: Left;  . PARTIAL KNEE ARTHROPLASTY Left 08/03/2016   Procedure: LEFT UNICOMPARTMENTAL KNEE ARTHROPLASTY;  Surgeon: Leandrew Koyanagi, MD;  Location: [place];  Service: Orthopedics;  Laterality: Left;  . SHOULDER ARTHROSCOPY WITH BICEPSTENOTOMY Left 01/15/2020   Procedure: SHOULDER ARTHROSCOPY WITH BICEPSTENOTOMY;  Surgeon: Hiram Gash, MD;  Location: [place];  Service: Orthopedics;  Laterality: Left;  . SHOULDER ARTHROSCOPY WITH DISTAL CLAVICLE RESECTION Left 01/15/2020   Procedure: SHOULDER ARTHROSCOPY WITH DISTAL CLAVICULECTOMY;  Surgeon: Hiram Gash, MD;  Location: [place];  Service: Orthopedics;  Laterality: Left;  . SHOULDER ARTHROSCOPY WITH ROTATOR CUFF REPAIR AND SUBACROMIAL DECOMPRESSION Left 01/15/2020   Procedure: SHOULDER ARTHROSCOPY DEBRIDMENT WITH ROTATOR CUFF REPAIR, SUBACROMIAL DECOMPRESSION WITH ACROMIPLASTY, AND BICEP TENOTOMY;  Surgeon: Hiram Gash, MD;  Location: [place];  Service: Orthopedics;  Laterality:  Left;  . TONSILLECTOMY    . TUBAL LIGATION  1992    Family History  Problem Relation Age of Onset  . Cancer Mother 7       melanoma  . Hypertension Sister   . Heart disease Sister        valve disease  . Depression Sister   . Diabetes Sister   . Sjogren's syndrome Sister     Social History   Socioeconomic History  . Marital status: Single    Spouse name: Not on file  . Number of children: Not on file  . Years of education: Not on file  . Highest education level: Not on file  Occupational History  . Not on file  Tobacco Use  .  Smoking status: Current Every Day Smoker    Packs/day: 1.00    Years: 25.00    Pack years: 25.00    Types: Cigarettes  . Smokeless tobacco: Never Used  . Tobacco comment: advised to d/c by 3 cigs per week.  Substance and Sexual Activity  . Alcohol use: No    Alcohol/week: 0.0 standard drinks  . Drug use: No  . Sexual activity: Yes    Birth control/protection: None, Post-menopausal  Other Topics Concern  . Not on file  Social History Narrative  . Not on file   Social Determinants of Health   Financial Resource Strain: Not on file  Food Insecurity: Not on file  Transportation Needs: Not on file  Physical Activity: Not on file  Stress: Not on file  Social Connections: Not on file  Intimate Partner Violence: Not on file    Outpatient Medications Prior to Visit  Medication Sig Dispense Refill  . AMBULATORY NON FORMULARY MEDICATION testosterone    . ammonium lactate (LAC-HYDRIN) 12 % lotion See admin instructions.    . BD PEN NEEDLE NANO 2ND GEN 32G X 4 MM MISC     . busPIRone (BUSPAR) 7.5 MG tablet TAKE 1 TABLET BY MOUTH TWICE A DAY AS NEEDED 60 tablet 0  . clobetasol cream (TEMOVATE) 0.05 % Apply topically.    . cyclobenzaprine (FLEXERIL) 10 MG tablet Take 10 mg by mouth 2 (two) times daily as needed.    . dicyclomine (BENTYL) 20 MG tablet Take 20 mg by mouth 3 (three) times daily.    . DULoxetine (CYMBALTA) 30 MG capsule TAKE 1 CAPSULE BY MOUTH DAILY ALONG WITH 60MG 30 capsule 2  . DULoxetine (CYMBALTA) 60 MG capsule TAKE 1 CAPSULE BY MOUTH EVERY DAY 30 capsule 2  . famotidine (PEPCID) 20 MG tablet Take 1 tablet (20 mg total) by mouth daily. 90 tablet 3  . Galcanezumab-gnlm (EMGALITY) 120 MG/ML SOAJ 240 [place] x 1, then 153m [place] q month. 2 mL 3  . HYDROcodone-acetaminophen (NORCO) 10-325 MG tablet Take 1 tablet by mouth every 6 (six) hours as needed for pain. for pain  0  . metoprolol succinate (TOPROL-XL) 25 MG 24 hr tablet Take 1 tablet (25 mg total) by mouth daily. 30 tablet 2   . NARCAN 4 MG/0.1ML LIQD nasal spray kit USE AS DIRECTED    . omeprazole (PRILOSEC) 40 MG capsule TAKE 1 CAPSULE BY MOUTH EVERY DAY 90 capsule 3  . ondansetron (ZOFRAN) 4 MG tablet TAKE 1 TABLET EVERY 8 HOURS AS NEEDED FOR NAUSEA AND VOMITING 10 tablet 1  . rivaroxaban (XARELTO) 20 MG TABS tablet Take 1 tablet (20 mg total) by mouth daily with supper. 90 tablet 1  . VICTOZA 18 MG/3ML SOPN Inject  1.8 mg into the skin daily.  1  . clonazePAM (KLONOPIN) 1 MG tablet TAKE 1 TABLET TWICE A DAY AS NEEDED ANXIETY MUST LAST 30 DAYS 45 tablet 1  . hydrOXYzine (VISTARIL) 25 MG capsule TAKE 1 CAPSULE (25 MG TOTAL) BY MOUTH AT BEDTIME AS NEEDED FOR ITCHING. 30 capsule 0   No facility-administered medications prior to visit.    Allergies  Allergen Reactions  . Ace Inhibitors     REACTION: cough  . Atenolol Other (See Comments)    Bradycardia  . Celebrex [Celecoxib] Other (See Comments)    Swelling in extremities  . Celexa [Citalopram Hydrobromide] Other (See Comments)    Dizziness Tolerated Lexapro   . Codeine Nausea Only  . Nitroglycerin Other (See Comments)    Drop in BP  . Phenytoin Swelling  . Sertraline Other (See Comments)    unknown  . Sumatriptan Nausea Only  . Tizanidine Other (See Comments)    Keep her awake.   . Topamax [Topiramate] Other (See Comments)    She reports that this makes her forgetful and causes her to studder  . Triamcinolone     Steroid flare after joint injection, use other steroid for injections    ROS Review of Systems    Objective:    Physical Exam Vitals reviewed.  Constitutional:      Appearance: She is well-developed and well-nourished.  HENT:     Head: Normocephalic and atraumatic.  Eyes:     Extraocular Movements: EOM normal.     Conjunctiva/sclera: Conjunctivae normal.  Cardiovascular:     Rate and Rhythm: Normal rate.  Pulmonary:     Effort: Pulmonary effort is normal.  Skin:    General: Skin is dry.     Coloration: Skin is not pale.   Neurological:     Mental Status: She is alert and oriented to person, place, and time.  Psychiatric:        Mood and Affect: Mood and affect normal.        Behavior: Behavior normal.     BP 132/64   Pulse 81   Ht '5\' 6"'  (1.676 m)   Wt 291 lb (132 kg)   LMP 12/28/2014   SpO2 100%   BMI 46.97 kg/m  Wt Readings from Last 3 Encounters:  06/11/20 291 lb (132 kg)  03/11/20 300 lb (136.1 kg)  01/15/20 293 lb 3.4 oz (133 kg)     Health Maintenance Due  Topic Date Due  . Hepatitis C Screening  Never done    There are no preventive care reminders to display for this patient.  Lab Results  Component Value Date   TSH 1.10 08/04/2019   Lab Results  Component Value Date   WBC 8.1 10/02/2019   HGB 12.1 10/02/2019   HCT 38.2 10/02/2019   MCV 81.3 10/02/2019   PLT 363 10/02/2019   Lab Results  Component Value Date   NA 138 01/12/2020   K 4.2 01/12/2020   CO2 26 01/12/2020   GLUCOSE 99 01/12/2020   BUN 10 01/12/2020   CREATININE 0.74 01/12/2020   BILITOT 0.2 10/03/2019   ALKPHOS 98 11/29/2016   AST 14 10/03/2019   ALT 10 10/03/2019   PROT 5.9 (L) 10/03/2019   ALBUMIN 3.6 11/29/2016   CALCIUM 9.1 01/12/2020   ANIONGAP 10 01/12/2020   Lab Results  Component Value Date   CHOL 163 08/04/2019   Lab Results  Component Value Date   HDL 42 (L) 08/04/2019   Lab  Results  Component Value Date   LDLCALC 98 08/04/2019   Lab Results  Component Value Date   TRIG 133 08/04/2019   Lab Results  Component Value Date   CHOLHDL 3.9 08/04/2019   Lab Results  Component Value Date   HGBA1C 5.9 (A) 06/11/2020      Assessment & Plan:   Problem List Items Addressed This Visit      Cardiovascular and Mediastinum   Migraine without aura - Primary    Discussed options for now we will continue with Emgality and give it a couple more months before we make a decision 1 where the other.  She did not get a great response with Imitrex and had already tried maxalt so we will try  Relpax for acute rescue discussed that there are several different options for triptan so we will just try different one and see if she feels like she gets a better response.  For her acute migraine we discussed doing a Toradol injection today that usually works well for her and gives her relief.  She asked about oral ketorolac as an option to use as needed but with her recent GI bleeding I am concerned to put her on any type of NSAID right now.      Relevant Medications   clonazePAM (KLONOPIN) 1 MG tablet   eletriptan (RELPAX) 40 MG tablet     Endocrine   IFG (impaired fasting glucose)    A1c is stable today.  Continue to work on healthy diet and regular exercise she is actually lost about 8 pounds since she was last here which is fantastic.  Lab Results  Component Value Date   HGBA1C 5.9 (A) 06/11/2020         Relevant Orders   POCT glycosylated hemoglobin (Hb A1C) (Completed)     Other   Anxiety state    Did refill her clonazepam today.      Relevant Medications   clonazePAM (KLONOPIN) 1 MG tablet   Abdominal pain, chronic, epigastric    Scheduled for colonoscopy and endoscopy in March.      Relevant Medications   clonazePAM (KLONOPIN) 1 MG tablet      Meds ordered this encounter  Medications  . clonazePAM (KLONOPIN) 1 MG tablet    Sig: TAKE 1 TABLET TWICE A DAY AS NEEDED ANXIETY MUST LAST 30 DAYS    Dispense:  45 tablet    Refill:  1    Not to exceed 5 additional fills before 04/19/2020 DX Code Needed  .  . eletriptan (RELPAX) 40 MG tablet    Sig: Take 1 tablet (40 mg total) by mouth as needed for migraine or headache. May repeat in 2 hours if headache persists or recurs.    Dispense:  10 tablet    Refill:  0  . ketorolac (TORADOL) injection 60 mg    Follow-up: Return in about 2 months (around 08/09/2020) for Migraines.    Beatrice Lecher, MD

## 2020-06-11 NOTE — Assessment & Plan Note (Signed)
Discussed options for now we will continue with Emgality and give it a couple more months before we make a decision 1 where the other.  She did not get a great response with Imitrex and had already tried maxalt so we will try Relpax for acute rescue discussed that there are several different options for triptan so we will just try different one and see if she feels like she gets a better response.  For her acute migraine we discussed doing a Toradol injection today that usually works well for her and gives her relief.  She asked about oral ketorolac as an option to use as needed but with her recent GI bleeding I am concerned to put her on any type of NSAID right now.

## 2020-06-14 DIAGNOSIS — F17210 Nicotine dependence, cigarettes, uncomplicated: Secondary | ICD-10-CM | POA: Diagnosis not present

## 2020-06-14 DIAGNOSIS — Z79899 Other long term (current) drug therapy: Secondary | ICD-10-CM | POA: Diagnosis not present

## 2020-06-14 DIAGNOSIS — M545 Low back pain, unspecified: Secondary | ICD-10-CM | POA: Diagnosis not present

## 2020-06-14 DIAGNOSIS — Z79891 Long term (current) use of opiate analgesic: Secondary | ICD-10-CM | POA: Diagnosis not present

## 2020-06-14 DIAGNOSIS — G8929 Other chronic pain: Secondary | ICD-10-CM | POA: Diagnosis not present

## 2020-06-25 ENCOUNTER — Other Ambulatory Visit (HOSPITAL_COMMUNITY): Payer: Self-pay | Admitting: Psychiatry

## 2020-06-25 ENCOUNTER — Other Ambulatory Visit: Payer: Self-pay | Admitting: Family Medicine

## 2020-06-25 DIAGNOSIS — F331 Major depressive disorder, recurrent, moderate: Secondary | ICD-10-CM

## 2020-06-30 ENCOUNTER — Other Ambulatory Visit: Payer: Self-pay | Admitting: Family Medicine

## 2020-06-30 DIAGNOSIS — G43019 Migraine without aura, intractable, without status migrainosus: Secondary | ICD-10-CM

## 2020-07-12 DIAGNOSIS — G35 Multiple sclerosis: Secondary | ICD-10-CM | POA: Diagnosis not present

## 2020-07-12 DIAGNOSIS — Z79899 Other long term (current) drug therapy: Secondary | ICD-10-CM | POA: Diagnosis not present

## 2020-07-12 DIAGNOSIS — M545 Low back pain, unspecified: Secondary | ICD-10-CM | POA: Diagnosis not present

## 2020-07-12 DIAGNOSIS — G8929 Other chronic pain: Secondary | ICD-10-CM | POA: Diagnosis not present

## 2020-07-12 DIAGNOSIS — F17210 Nicotine dependence, cigarettes, uncomplicated: Secondary | ICD-10-CM | POA: Diagnosis not present

## 2020-07-14 ENCOUNTER — Ambulatory Visit (INDEPENDENT_AMBULATORY_CARE_PROVIDER_SITE_OTHER): Payer: Medicare Other | Admitting: Licensed Clinical Social Worker

## 2020-07-14 DIAGNOSIS — F4310 Post-traumatic stress disorder, unspecified: Secondary | ICD-10-CM

## 2020-07-14 DIAGNOSIS — F411 Generalized anxiety disorder: Secondary | ICD-10-CM | POA: Diagnosis not present

## 2020-07-14 DIAGNOSIS — F331 Major depressive disorder, recurrent, moderate: Secondary | ICD-10-CM | POA: Diagnosis not present

## 2020-07-14 DIAGNOSIS — G894 Chronic pain syndrome: Secondary | ICD-10-CM

## 2020-07-14 DIAGNOSIS — F4321 Adjustment disorder with depressed mood: Secondary | ICD-10-CM

## 2020-07-14 DIAGNOSIS — F4381 Prolonged grief disorder: Secondary | ICD-10-CM

## 2020-07-14 NOTE — Progress Notes (Signed)
Virtual Visit via Telephone Note  I connected with Ashley Gomez on 07/14/20 at 10:00 AM EST by telephone and verified that I am speaking with the correct person using two identifiers.  Location: Patient: home Provider: home office   I discussed the limitations, risks, security and privacy concerns of performing an evaluation and management service by telephone and the availability of in person appointments. I also discussed with the patient that there may be a patient responsible charge related to this service. The patient expressed understanding and agreed to proceed.   I discussed the assessment and treatment plan with the patient. The patient was provided an opportunity to ask questions and all were answered. The patient agreed with the plan and demonstrated an understanding of the instructions.   The patient was advised to call back or seek an in-person evaluation if the symptoms worsen or if the condition fails to improve as anticipated.  I provided 54 minutes of non-face-to-face time during this encounter.  THERAPIST PROGRESS NOTE  Session Time: 10:00 AM to 10:54 AM  Participation Level: Active  Behavioral Response: CasualAlertAnxious and Depressed  Type of Therapy: Individual Therapy  Treatment Goals addressed:  supportive interventions continue to be significant intervention for patient and continue to work on management decreasing depression, anxiety, stress management, trauma, grief, coping Interventions: Solution Focused, Strength-based, Supportive, Reframing and Other: trauma, stress management, coping  Summary: Ashley Gomez is a 50 y.o. female who presents with "I'm ok I guess" So much going on Ashley Gomez still there and giving patient a hard time. It is going good but really tries her and pushes her buttons back talks and doesn't clean up after herself. Typical teenage but only 8. She won't talk to anyone. She has ODD very defiant. She was seeing a therapist and  didn't continue because she wouldn't talk to her. Medicine is helping her mood a little on Vyvanse for past two weeks She is so angry to be 50 years old. When acts her age she is great. Sits and talks with her and her granddaughter can't identify how feeling, blaming this is what it is like to be 8. Patient doesn't know how to help her. Hope it is changing. A year March 6 Ashley Gomez has passed. Also a year since mother seen her. Over a year since she talked to patient. That is a year November or December. Patient and sister's dad, real dad in hospital since New Year's and almost died. He hasn't had much to do with them but have seen him a couple of times at the hospital. He started crying saying he is sorry and that got to patient. He is slowly getting better. Ashley Gomez came back from [place] from [place]. He went back. He is flying back this week. It has been a good thing but worries about her kids. His being far away worries her. Can call and video him to see how he is doing and can see how is doing by how he looks. Only smoking pot at this point. Working at [employer] and likes it. Hopes he can handle anniversary of his fiance's death. They get the kids next weekend.(Ashley Gomez and [employer]) If they want to see the kids have to go to [place] to get them and bring them all the way home. Baby Ashley Gomez turned 1 in August, [place]. Sees the kids every 2-4 months. Talks to them on the phone every 2-3 weeks. Stays so stressed. Hurt all the time. Shoulder is not better.  Hip is messed up. Hasn't been to doctor again for hip and right knee. Sick of going to the doctor. Telling herself that things have got to get better, headache all the time even if shots for it. Depression and shares a source is that she remembers a lot of things happen when younger. Notices holds her breath has to do when little. Remember laying on floor watching TV in den and if Mom hear them breath yell at them and go to room. Finds herself  holding her breath a lot. Mad that she is 55 and doesn't understand why she lets that bother her the things she did and said to them. Not sure how she wants to approach it (therapist educated her about treatment for trauma) and gets mad at herself that she lets it bother her. Sister asks why let it bother her and patient answers she doesn't know why. Discussed ways to disrupt thoughts. She catches herself holding her breath and then gets mad at herself for doing it. Then the memory comes in why doing it. (memory of the past). Doesn't know why started up last few months. Haven't seen mom or talked to mom. Discussed how seeing her dad could have brought this up. Mom has told her Dad that patient hates him and that is not true. Of course his situation being in the hospital feels obligated to see him. Mom has tried her best to keep them away from him their entire life. Even now says they are not their dad. Mom is like a vindictive teenager he is mine and you are not talking to him. In session identified mom as not a nice person. Lies so much and patient ends up arguing with her. Grandparents raised to forgive but can't with her. Mom didn't even want her. Didn't act like a mom should. Not a grandmother or great grandmother to patient's kids. So much happened and patient said as a mom worries about her kids and doesn't hear anything from her. Doesn't understand it and tell herself not to try to understand it. Thinks one of the sources of stress is that [place] acts just like [place] and looks just like her. Tries to rationalize things to help cope, comes up with reasons why anxious, or mad come up with a reason. Then ask why does it let it bother.her. Most days "feel like loosing her mind." Happy face but after awhile gets to her. Talk to doctor depression medication not working and doctor says holidays. Get to the point where wants to lay in bed all the time unless appointments.  Therapist encouraged her with strategies  for depression see below.  Therapist reviewed any significant changes in mood and functioning as well significant events since patient was last seen last September.  Provided education on trauma that they are unprocessed memories and different strategies to address that explored with patient her interest in continuing with trauma work.  Assessed the session was helpful for patient to talk about symptoms that are coming up in past memories to help her with symptoms.  Discussed may be seeing her father brought up all trauma networks, and trauma work would be reviewing what happened and then mentally processing see the way schemas developed and challenge and change them.  Work with patient on stress of parenting her daughter's child encouraging her with some parenting strategies such as listening and being available for her to talk and being patient with the process, reframing for patient to see some positive aspects that  she looks like her daughter, identifying this as her daughter continuing to be present in her life.  Encourage patient with being more active to manage depression that being inactive feeds the negative cycle of depression but even setting a small goal for the day will help her have more energy.  Work with patient on stress management, coping, therapist provided active listening, open questions, supportive interventions  Suicidal/Homicidal: No  Plan: Return again in 4 weeks.2.  Therapist work with patient on depression, stress management, trauma as needed, coping  Diagnosis: Axis I:  generalized anxiety disorder PTSD, complicated grief, chronic pain syndrome, Major depressive disorder, recurrent moderate    Axis II: No diagnosis    Cordella Register, LCSW 07/14/2020

## 2020-07-16 DIAGNOSIS — Z20822 Contact with and (suspected) exposure to covid-19: Secondary | ICD-10-CM | POA: Diagnosis not present

## 2020-07-16 DIAGNOSIS — Z01812 Encounter for preprocedural laboratory examination: Secondary | ICD-10-CM | POA: Diagnosis not present

## 2020-07-18 ENCOUNTER — Other Ambulatory Visit: Payer: Self-pay | Admitting: Family Medicine

## 2020-07-19 DIAGNOSIS — K5730 Diverticulosis of large intestine without perforation or abscess without bleeding: Secondary | ICD-10-CM | POA: Diagnosis not present

## 2020-07-19 DIAGNOSIS — K449 Diaphragmatic hernia without obstruction or gangrene: Secondary | ICD-10-CM | POA: Diagnosis not present

## 2020-07-19 DIAGNOSIS — R7303 Prediabetes: Secondary | ICD-10-CM | POA: Diagnosis not present

## 2020-07-19 DIAGNOSIS — K219 Gastro-esophageal reflux disease without esophagitis: Secondary | ICD-10-CM | POA: Diagnosis not present

## 2020-07-19 DIAGNOSIS — G894 Chronic pain syndrome: Secondary | ICD-10-CM | POA: Diagnosis not present

## 2020-07-19 DIAGNOSIS — K648 Other hemorrhoids: Secondary | ICD-10-CM | POA: Diagnosis not present

## 2020-07-19 DIAGNOSIS — Z96652 Presence of left artificial knee joint: Secondary | ICD-10-CM | POA: Diagnosis not present

## 2020-07-19 DIAGNOSIS — Z86718 Personal history of other venous thrombosis and embolism: Secondary | ICD-10-CM | POA: Diagnosis not present

## 2020-07-19 DIAGNOSIS — G35 Multiple sclerosis: Secondary | ICD-10-CM | POA: Diagnosis not present

## 2020-07-19 DIAGNOSIS — M170 Bilateral primary osteoarthritis of knee: Secondary | ICD-10-CM | POA: Diagnosis not present

## 2020-07-19 DIAGNOSIS — I10 Essential (primary) hypertension: Secondary | ICD-10-CM | POA: Diagnosis not present

## 2020-07-19 DIAGNOSIS — D3502 Benign neoplasm of left adrenal gland: Secondary | ICD-10-CM | POA: Diagnosis not present

## 2020-07-19 DIAGNOSIS — M5136 Other intervertebral disc degeneration, lumbar region: Secondary | ICD-10-CM | POA: Diagnosis not present

## 2020-07-19 DIAGNOSIS — D735 Infarction of spleen: Secondary | ICD-10-CM | POA: Diagnosis not present

## 2020-07-19 DIAGNOSIS — K760 Fatty (change of) liver, not elsewhere classified: Secondary | ICD-10-CM | POA: Diagnosis not present

## 2020-07-19 DIAGNOSIS — F17210 Nicotine dependence, cigarettes, uncomplicated: Secondary | ICD-10-CM | POA: Diagnosis not present

## 2020-07-19 DIAGNOSIS — D759 Disease of blood and blood-forming organs, unspecified: Secondary | ICD-10-CM | POA: Diagnosis not present

## 2020-07-19 DIAGNOSIS — M1612 Unilateral primary osteoarthritis, left hip: Secondary | ICD-10-CM | POA: Diagnosis not present

## 2020-07-19 DIAGNOSIS — Z8673 Personal history of transient ischemic attack (TIA), and cerebral infarction without residual deficits: Secondary | ICD-10-CM | POA: Diagnosis not present

## 2020-07-19 DIAGNOSIS — R569 Unspecified convulsions: Secondary | ICD-10-CM | POA: Diagnosis not present

## 2020-07-19 DIAGNOSIS — R1013 Epigastric pain: Secondary | ICD-10-CM | POA: Diagnosis not present

## 2020-07-19 DIAGNOSIS — R1011 Right upper quadrant pain: Secondary | ICD-10-CM | POA: Diagnosis not present

## 2020-07-19 DIAGNOSIS — F17200 Nicotine dependence, unspecified, uncomplicated: Secondary | ICD-10-CM | POA: Diagnosis not present

## 2020-07-19 DIAGNOSIS — Z1211 Encounter for screening for malignant neoplasm of colon: Secondary | ICD-10-CM | POA: Diagnosis not present

## 2020-07-19 DIAGNOSIS — K3189 Other diseases of stomach and duodenum: Secondary | ICD-10-CM | POA: Diagnosis not present

## 2020-07-19 LAB — HM COLONOSCOPY

## 2020-07-22 DIAGNOSIS — R7303 Prediabetes: Secondary | ICD-10-CM | POA: Diagnosis not present

## 2020-07-22 DIAGNOSIS — I10 Essential (primary) hypertension: Secondary | ICD-10-CM | POA: Diagnosis not present

## 2020-07-28 ENCOUNTER — Encounter: Payer: Self-pay | Admitting: Family Medicine

## 2020-07-29 ENCOUNTER — Telehealth (HOSPITAL_COMMUNITY): Payer: Medicare Other | Admitting: Psychiatry

## 2020-07-30 ENCOUNTER — Telehealth (HOSPITAL_COMMUNITY): Payer: Medicare Other | Admitting: Psychiatry

## 2020-08-05 ENCOUNTER — Encounter: Payer: Self-pay | Admitting: Family Medicine

## 2020-08-05 ENCOUNTER — Ambulatory Visit (INDEPENDENT_AMBULATORY_CARE_PROVIDER_SITE_OTHER): Payer: Medicare Other | Admitting: Family Medicine

## 2020-08-05 ENCOUNTER — Other Ambulatory Visit: Payer: Self-pay

## 2020-08-05 VITALS — BP 132/73 | HR 80 | Ht 66.0 in | Wt 289.0 lb

## 2020-08-05 DIAGNOSIS — R7301 Impaired fasting glucose: Secondary | ICD-10-CM

## 2020-08-05 DIAGNOSIS — Z7901 Long term (current) use of anticoagulants: Secondary | ICD-10-CM | POA: Diagnosis not present

## 2020-08-05 DIAGNOSIS — E278 Other specified disorders of adrenal gland: Secondary | ICD-10-CM | POA: Diagnosis not present

## 2020-08-05 DIAGNOSIS — Z6841 Body Mass Index (BMI) 40.0 and over, adult: Secondary | ICD-10-CM

## 2020-08-05 DIAGNOSIS — J338 Other polyp of sinus: Secondary | ICD-10-CM

## 2020-08-05 DIAGNOSIS — R238 Other skin changes: Secondary | ICD-10-CM

## 2020-08-05 DIAGNOSIS — G43019 Migraine without aura, intractable, without status migrainosus: Secondary | ICD-10-CM

## 2020-08-05 DIAGNOSIS — D735 Infarction of spleen: Secondary | ICD-10-CM | POA: Diagnosis not present

## 2020-08-05 MED ORDER — NURTEC 75 MG PO TBDP
1.0000 | ORAL_TABLET | Freq: Every day | ORAL | 1 refills | Status: DC | PRN
Start: 2020-08-05 — End: 2020-11-02

## 2020-08-05 NOTE — Assessment & Plan Note (Signed)
Following with the bariatric medicine provider

## 2020-08-05 NOTE — Assessment & Plan Note (Signed)
Was stable in 2019.

## 2020-08-05 NOTE — Assessment & Plan Note (Signed)
Due to recheck A1C  

## 2020-08-05 NOTE — Assessment & Plan Note (Signed)
Really has not noticed a significant difference on the Emgality versus the Aimovig.  But she is wondering if sinus polyps could be contributing to a lot of her headaches.  I think it is reasonable to do an ENT referral to investigate that further before coming off of the [facility] and trying something else.  She thinks it Nurtec will be covered under her insurance since the Relpax was not.  We will wait and send over new prescription.

## 2020-08-05 NOTE — Assessment & Plan Note (Signed)
Now working with the [employer] attrition.  We will get up-to-date labs so that we can get that over to their office.  They would like to have a testosterone level drawn as well.

## 2020-08-05 NOTE — Progress Notes (Signed)
Established Patient Office Visit  Subjective:  Patient ID: Ashley Gomez, female    DOB: 24-Dec-1970  Age: 50 y.o. MRN: [id number]  CC:  Chief Complaint  Patient presents with  . Migraine    HPI KIMBALL MANSKE presents for   Follow-up migraine headaches-we had recently switched her to [facility] from [place] to see if she would have a better response.  I also switched her rescue medication. She says the relpax wasn't covered by insurance and would like to try Nurtec.    Also recently underwent colonoscopy and endoscopy.  No significant abnormal findings.  That had to have a little bit of erythema at the antrum.  She was also noted to have a hiatal hernia.  She is also currently on Xarelto for splenic infarct in 10/03/2019 at [facility] that was evidently caused by a thromboembolism.  She also has a prior history of DVT she would really like to consult with hematology oncology to see if she can actually come off of the Xarelto.  He also complains of 6 months of erythema on the top of her left foot in particular it is the blood vessels that are little bit distended.  She also reports that more recently she feels like that left leg feels like it is on fire and most like a burning sensation going in the thigh down to the bone.  He also has a history of sinus polyps and wonders if that could actually be contributing to her migraines she said she was diagnosed probably a decade ago and would like to see ENT again for this.  Also has recently established with the bariatric clinic and would like to go ahead and get full labs including testosterone she is can come back tomorrow to do that fasting.  Past Medical History:  Diagnosis Date  . Anxiety   . Arthritis   . CVA (cerebral infarction)    Old CVA on MRI brain  . Depression   . DVT (deep venous thrombosis) ([place]) 73/71/0626   L basilic vein   . Facet hypertrophy of lumbar region    MRI 2007  . Fibromyalgia   . GERD (gastroesophageal  reflux disease)   . Hypertension   . Migraines   . MS (multiple sclerosis) ([place])   . Neuromuscular disorder ([place])   . Obesity   . Pre-diabetes   . PTSD (post-traumatic stress disorder)   . Seizures ([place]) 2011   no seizure since onset  . Smoking   . Stroke [facility])    TIA, >8years ago    Past Surgical History:  Procedure Laterality Date  . CHOLECYSTECTOMY  10/2017  . CHONDROPLASTY Left 12/30/2014   Procedure: CHONDROPLASTY;  Surgeon: Leandrew Koyanagi, MD;  Location: [place];  Service: Orthopedics;  Laterality: Left;  . KNEE ARTHROSCOPY Left 12/30/2014   Procedure: LEFT KNEE ARTHROSCOPY WITH   CHONDROPLASTY;  Surgeon: Leandrew Koyanagi, MD;  Location: [place];  Service: Orthopedics;  Laterality: Left;  . PARTIAL KNEE ARTHROPLASTY Left 08/03/2016   Procedure: LEFT UNICOMPARTMENTAL KNEE ARTHROPLASTY;  Surgeon: Leandrew Koyanagi, MD;  Location: [place];  Service: Orthopedics;  Laterality: Left;  . SHOULDER ARTHROSCOPY WITH BICEPSTENOTOMY Left 01/15/2020   Procedure: SHOULDER ARTHROSCOPY WITH BICEPSTENOTOMY;  Surgeon: Hiram Gash, MD;  Location: [place];  Service: Orthopedics;  Laterality: Left;  . SHOULDER ARTHROSCOPY WITH DISTAL CLAVICLE RESECTION Left 01/15/2020   Procedure: SHOULDER ARTHROSCOPY WITH DISTAL CLAVICULECTOMY;  Surgeon: Hiram Gash, MD;  Location: [place];  Service: Orthopedics;  Laterality: Left;  . SHOULDER ARTHROSCOPY WITH ROTATOR CUFF REPAIR AND SUBACROMIAL DECOMPRESSION Left 01/15/2020   Procedure: SHOULDER ARTHROSCOPY DEBRIDMENT WITH ROTATOR CUFF REPAIR, SUBACROMIAL DECOMPRESSION WITH ACROMIPLASTY, AND BICEP TENOTOMY;  Surgeon: Hiram Gash, MD;  Location: [place];  Service: Orthopedics;  Laterality: Left;  . TONSILLECTOMY    . TUBAL LIGATION  1992    Family History  Problem Relation Age of Onset  . Cancer Mother 75       melanoma  . Hypertension Sister   . Heart disease Sister        valve  disease  . Depression Sister   . Diabetes Sister   . Sjogren's syndrome Sister     Social History   Socioeconomic History  . Marital status: Single    Spouse name: Not on file  . Number of children: Not on file  . Years of education: Not on file  . Highest education level: Not on file  Occupational History  . Not on file  Tobacco Use  . Smoking status: Current Every Day Smoker    Packs/day: 1.00    Years: 25.00    Pack years: 25.00    Types: Cigarettes  . Smokeless tobacco: Never Used  . Tobacco comment: advised to d/c by 3 cigs per week.  Substance and Sexual Activity  . Alcohol use: No    Alcohol/week: 0.0 standard drinks  . Drug use: No  . Sexual activity: Yes    Birth control/protection: None, Post-menopausal  Other Topics Concern  . Not on file  Social History Narrative  . Not on file   Social Determinants of Health   Financial Resource Strain: Not on file  Food Insecurity: Not on file  Transportation Needs: Not on file  Physical Activity: Not on file  Stress: Not on file  Social Connections: Not on file  Intimate Partner Violence: Not on file    Outpatient Medications Prior to Visit  Medication Sig Dispense Refill  . AMBULATORY NON FORMULARY MEDICATION testosterone    . ammonium lactate (LAC-HYDRIN) 12 % lotion See admin instructions.    . BD PEN NEEDLE NANO 2ND GEN 32G X 4 MM MISC     . busPIRone (BUSPAR) 7.5 MG tablet TAKE 1 TABLET BY MOUTH TWICE A DAY AS NEEDED 180 tablet 0  . clobetasol cream (TEMOVATE) 0.05 % Apply topically.    . clonazePAM (KLONOPIN) 1 MG tablet TAKE 1 TABLET TWICE A DAY AS NEEDED ANXIETY MUST LAST 30 DAYS 45 tablet 1  . cyclobenzaprine (FLEXERIL) 10 MG tablet Take 10 mg by mouth 2 (two) times daily as needed.    . dicyclomine (BENTYL) 20 MG tablet Take 20 mg by mouth 3 (three) times daily.    . DULoxetine (CYMBALTA) 30 MG capsule TAKE 1 CAPSULE BY MOUTH DAILY ALONG WITH 60MG 90 capsule 0  . DULoxetine (CYMBALTA) 60 MG capsule  TAKE 1 CAPSULE BY MOUTH EVERY DAY 90 capsule 0  . famotidine (PEPCID) 20 MG tablet TAKE 1 TABLET BY MOUTH EVERY DAY 90 tablet 3  . Galcanezumab-gnlm (EMGALITY) 120 MG/ML SOAJ INJECT 240MG (2 ML) UNDER THE SKIN FOR 1 DOSE , THEN INJECT 120MG (1 ML) UNDER THE SKIN EVERY MONTH. 2 mL 3  . HYDROcodone-acetaminophen (NORCO) 10-325 MG tablet Take 1 tablet by mouth every 6 (six) hours as needed for pain. for pain  0  . levocetirizine (XYZAL) 5 MG tablet Take 5 mg by mouth daily.    Marland Kitchen  metoprolol succinate (TOPROL-XL) 25 MG 24 hr tablet TAKE 1 TABLET BY MOUTH EVERY DAY 30 tablet 2  . NARCAN 4 MG/0.1ML LIQD nasal spray kit USE AS DIRECTED    . omeprazole (PRILOSEC) 40 MG capsule TAKE 1 CAPSULE BY MOUTH EVERY DAY 90 capsule 3  . ondansetron (ZOFRAN) 4 MG tablet TAKE 1 TABLET EVERY 8 HOURS AS NEEDED FOR NAUSEA AND VOMITING 10 tablet 1  . rivaroxaban (XARELTO) 20 MG TABS tablet Take 1 tablet (20 mg total) by mouth daily with supper. 90 tablet 1  . VICTOZA 18 MG/3ML SOPN Inject 1.8 mg into the skin daily.  1  . eletriptan (RELPAX) 40 MG tablet Take 1 tablet (40 mg total) by mouth as needed for migraine or headache. May repeat in 2 hours if headache persists or recurs. 10 tablet 0   No facility-administered medications prior to visit.    Allergies  Allergen Reactions  . Ace Inhibitors     REACTION: cough  . Atenolol Other (See Comments)    Bradycardia  . Celebrex [Celecoxib] Other (See Comments)    Swelling in extremities  . Celexa [Citalopram Hydrobromide] Other (See Comments)    Dizziness Tolerated Lexapro   . Codeine Nausea Only  . Nitroglycerin Other (See Comments)    Drop in BP  . Phenytoin Swelling  . Sertraline Other (See Comments)    unknown  . Sumatriptan Nausea Only  . Tizanidine Other (See Comments)    Keep her awake.   . Topamax [Topiramate] Other (See Comments)    She reports that this makes her forgetful and causes her to studder  . Triamcinolone     Steroid flare after joint  injection, use other steroid for injections    ROS Review of Systems    Objective:    Physical Exam Constitutional:      Appearance: She is well-developed.  HENT:     Head: Normocephalic and atraumatic.  Cardiovascular:     Rate and Rhythm: Normal rate and regular rhythm.     Heart sounds: Normal heart sounds.     Comments: Dorsal pedal pulse on the left foot was normal.  Good capillary refill. Pulmonary:     Effort: Pulmonary effort is normal.     Breath sounds: Normal breath sounds.  Skin:    General: Skin is warm and dry.     Comments: The top of her left foot in a bandlike pattern where her shoe actually presses she actually has some increased vascularity that blanches easily with touch.  No rash or irritation.  It really just looks like distended blood vessels.  Neurological:     Mental Status: She is alert and oriented to person, place, and time.  Psychiatric:        Behavior: Behavior normal.     BP 132/73   Pulse 80   Ht '5\' 6"'  (1.676 m)   Wt 289 lb (131.1 kg)   LMP 12/28/2014   SpO2 97%   BMI 46.65 kg/m  Wt Readings from Last 3 Encounters:  08/05/20 289 lb (131.1 kg)  06/11/20 291 lb (132 kg)  03/11/20 300 lb (136.1 kg)     Health Maintenance Due  Topic Date Due  . Hepatitis C Screening  Never done    There are no preventive care reminders to display for this patient.  Lab Results  Component Value Date   TSH 1.10 08/04/2019   Lab Results  Component Value Date   WBC 8.1 10/02/2019   HGB 12.1  10/02/2019   HCT 38.2 10/02/2019   MCV 81.3 10/02/2019   PLT 363 10/02/2019   Lab Results  Component Value Date   NA 138 01/12/2020   K 4.2 01/12/2020   CO2 26 01/12/2020   GLUCOSE 99 01/12/2020   BUN 10 01/12/2020   CREATININE 0.74 01/12/2020   BILITOT 0.2 10/03/2019   ALKPHOS 98 11/29/2016   AST 14 10/03/2019   ALT 10 10/03/2019   PROT 5.9 (L) 10/03/2019   ALBUMIN 3.6 11/29/2016   CALCIUM 9.1 01/12/2020   ANIONGAP 10 01/12/2020   Lab  Results  Component Value Date   CHOL 163 08/04/2019   Lab Results  Component Value Date   HDL 42 (L) 08/04/2019   Lab Results  Component Value Date   LDLCALC 98 08/04/2019   Lab Results  Component Value Date   TRIG 133 08/04/2019   Lab Results  Component Value Date   CHOLHDL 3.9 08/04/2019   Lab Results  Component Value Date   HGBA1C 5.9 (A) 06/11/2020      Assessment & Plan:   Problem List Items Addressed This Visit      Cardiovascular and Mediastinum   Migraine without aura - Primary    Really has not noticed a significant difference on the Emgality versus the Aimovig.  But she is wondering if sinus polyps could be contributing to a lot of her headaches.  I think it is reasonable to do an ENT referral to investigate that further before coming off of the [facility] and trying something else.  She thinks it Nurtec will be covered under her insurance since the Relpax was not.  We will wait and send over new prescription.      Relevant Medications   Rimegepant Sulfate (NURTEC) 75 MG TBDP     Endocrine   IFG (impaired fasting glucose)    Due to recheck A1C.       Relevant Orders   Hemoglobin A1c     Other   Morbid obesity ([place])    Following with the bariatric medicine provider      BMI 45.0-49.9, adult [facility])    Now working with the [employer] attrition.  We will get up-to-date labs so that we can get that over to their office.  They would like to have a testosterone level drawn as well.      Relevant Orders   CBC   COMPLETE METABOLIC PANEL WITH GFR   Lipid panel   TSH   Hemoglobin A1c   Testosterone Total,Free,Bio, Males   Cortisol   Adrenal mass ([place])    Was stable in 2019.      Relevant Orders   CBC   COMPLETE METABOLIC PANEL WITH GFR   Lipid panel   TSH   Hemoglobin A1c   Testosterone Total,Free,Bio, Males   Cortisol    Other Visit Diagnoses    Nasal sinus polyp       Relevant Orders   Ambulatory referral to ENT   Splenic infarct        Relevant Orders   Ambulatory referral to Hematology / Oncology   Anticoagulated       Relevant Orders   Ambulatory referral to Hematology / Oncology   CBC   COMPLETE METABOLIC PANEL WITH GFR   Lipid panel   TSH   Hemoglobin A1c   Testosterone Total,Free,Bio, Males   Cortisol   Dusky feet       Relevant Orders   VAS [name] ABI WITH/WO TBI  CBC   COMPLETE METABOLIC PANEL WITH GFR   Lipid panel   TSH   Hemoglobin A1c   Testosterone Total,Free,Bio, Males   Cortisol      We will refer to heme-onc so that they can evaluate whether or not she can come off of the Xarelto.  Polyps-we will refer to ENT for further work-up and possible treatment.  It would be interesting to see if this could actually be contributing to her uncontrolled migraine headaches.  Meds ordered this encounter  Medications  . Rimegepant Sulfate (NURTEC) 75 MG TBDP    Sig: Take 1 tablet by mouth daily as needed.    Dispense:  8 tablet    Refill:  1    Follow-up: Return in about 3 months (around 11/05/2020) for Migraines.    Beatrice Lecher, MD

## 2020-08-09 ENCOUNTER — Ambulatory Visit: Payer: Medicare Other | Admitting: Family Medicine

## 2020-08-09 ENCOUNTER — Encounter: Payer: Self-pay | Admitting: Family Medicine

## 2020-08-09 DIAGNOSIS — G8929 Other chronic pain: Secondary | ICD-10-CM | POA: Diagnosis not present

## 2020-08-09 DIAGNOSIS — E278 Other specified disorders of adrenal gland: Secondary | ICD-10-CM | POA: Diagnosis not present

## 2020-08-09 DIAGNOSIS — Z79899 Other long term (current) drug therapy: Secondary | ICD-10-CM | POA: Diagnosis not present

## 2020-08-09 DIAGNOSIS — R7301 Impaired fasting glucose: Secondary | ICD-10-CM | POA: Diagnosis not present

## 2020-08-09 DIAGNOSIS — Z79891 Long term (current) use of opiate analgesic: Secondary | ICD-10-CM | POA: Diagnosis not present

## 2020-08-09 DIAGNOSIS — M545 Low back pain, unspecified: Secondary | ICD-10-CM | POA: Diagnosis not present

## 2020-08-09 DIAGNOSIS — Z7901 Long term (current) use of anticoagulants: Secondary | ICD-10-CM | POA: Diagnosis not present

## 2020-08-09 DIAGNOSIS — R238 Other skin changes: Secondary | ICD-10-CM | POA: Diagnosis not present

## 2020-08-10 ENCOUNTER — Encounter: Payer: Self-pay | Admitting: Family Medicine

## 2020-08-10 DIAGNOSIS — F411 Generalized anxiety disorder: Secondary | ICD-10-CM

## 2020-08-11 LAB — LIPID PANEL
Cholesterol: 207 mg/dL — ABNORMAL HIGH (ref <200)
HDL: 33 mg/dL — ABNORMAL LOW (ref >=50)
Non-HDL Cholesterol (Calc): 174 mg/dL (calc) — ABNORMAL HIGH (ref <130)
Total CHOL/HDL Ratio: 6.3 (calc) — ABNORMAL HIGH (ref <5.0)
Triglycerides: 422 mg/dL — ABNORMAL HIGH (ref <150)

## 2020-08-11 LAB — COMPLETE METABOLIC PANEL WITH GFR
AG Ratio: 1.3 (calc) (ref 1.0–2.5)
ALT: 9 U/L (ref 6–29)
AST: 14 U/L (ref 10–35)
Albumin: 3.9 g/dL (ref 3.6–5.1)
Alkaline phosphatase (APISO): 103 U/L (ref 37–153)
BUN: 14 mg/dL (ref 7–25)
CO2: 27 mmol/L (ref 20–32)
Calcium: 9 mg/dL (ref 8.6–10.4)
Chloride: 100 mmol/L (ref 98–110)
Creat: 0.86 mg/dL (ref 0.50–1.05)
GFR, Est African American: 91 mL/min/{1.73_m2} (ref >=60)
GFR, Est Non African American: 78 mL/min/{1.73_m2} (ref >=60)
Globulin: 2.9 g/dL (calc) (ref 1.9–3.7)
Glucose, Bld: 128 mg/dL — ABNORMAL HIGH (ref 65–99)
Potassium: 4.5 mmol/L (ref 3.5–5.3)
Sodium: 137 mmol/L (ref 135–146)
Total Bilirubin: 0.3 mg/dL (ref 0.2–1.2)
Total Protein: 6.8 g/dL (ref 6.1–8.1)

## 2020-08-11 LAB — HEMOGLOBIN A1C
Hgb A1c MFr Bld: 5.7 % of total Hgb — ABNORMAL HIGH (ref <5.7)
Mean Plasma Glucose: 117 mg/dL
eAG (mmol/L): 6.5 mmol/L

## 2020-08-11 LAB — CBC
HCT: 46.7 % — ABNORMAL HIGH (ref 35.0–45.0)
Hemoglobin: 14.9 g/dL (ref 11.7–15.5)
MCH: 27.2 pg (ref 27.0–33.0)
MCHC: 31.9 g/dL — ABNORMAL LOW (ref 32.0–36.0)
MCV: 85.4 fL (ref 80.0–100.0)
MPV: 10.7 fL (ref 7.5–12.5)
Platelets: 327 10*3/uL (ref 140–400)
RBC: 5.47 10*6/uL — ABNORMAL HIGH (ref 3.80–5.10)
RDW: 15 % (ref 11.0–15.0)
WBC: 8.3 10*3/uL (ref 3.8–10.8)

## 2020-08-11 LAB — CORTISOL: Cortisol, Plasma: 5.7 ug/dL

## 2020-08-11 LAB — TSH: TSH: 1.53 mIU/L

## 2020-08-11 LAB — CP TESTOSTERONE, BIO-FEMALE/CHILDREN
Albumin: 4.4 g/dL (ref 3.6–5.1)
Sex Hormone Binding: 27.1 nmol/L (ref 17–124)
TESTOSTERONE, BIOAVAILABLE: 3.6 ng/dL (ref 0.5–8.5)
Testosterone, Free: 1.8 pg/mL (ref 0.2–5.0)
Testosterone, Total, LC-MS-MS: 13 ng/dL (ref 2–45)

## 2020-08-11 MED ORDER — CLONAZEPAM 1 MG PO TABS: ORAL_TABLET | ORAL | 1 refills | Status: DC

## 2020-08-12 NOTE — Telephone Encounter (Signed)
Recommendation to cut back on sugars and carbs and plan to recheck lipids and A1c in 3 to 4 months

## 2020-08-16 ENCOUNTER — Ambulatory Visit (HOSPITAL_COMMUNITY): Payer: Medicare Other | Admitting: Licensed Clinical Social Worker

## 2020-08-16 NOTE — Progress Notes (Signed)
Therapist contacted patient by phone and was only able to leave a message. Session was a no show

## 2020-08-22 ENCOUNTER — Encounter: Payer: Self-pay | Admitting: Family Medicine

## 2020-08-23 ENCOUNTER — Ambulatory Visit (INDEPENDENT_AMBULATORY_CARE_PROVIDER_SITE_OTHER): Payer: Medicare Other | Admitting: Licensed Clinical Social Worker

## 2020-08-23 ENCOUNTER — Other Ambulatory Visit: Payer: Self-pay

## 2020-08-23 DIAGNOSIS — F4310 Post-traumatic stress disorder, unspecified: Secondary | ICD-10-CM | POA: Diagnosis not present

## 2020-08-23 DIAGNOSIS — F331 Major depressive disorder, recurrent, moderate: Secondary | ICD-10-CM

## 2020-08-23 DIAGNOSIS — G894 Chronic pain syndrome: Secondary | ICD-10-CM

## 2020-08-23 DIAGNOSIS — F4321 Adjustment disorder with depressed mood: Secondary | ICD-10-CM | POA: Diagnosis not present

## 2020-08-23 DIAGNOSIS — F411 Generalized anxiety disorder: Secondary | ICD-10-CM | POA: Diagnosis not present

## 2020-08-23 DIAGNOSIS — R238 Other skin changes: Secondary | ICD-10-CM

## 2020-08-23 NOTE — Progress Notes (Signed)
Virtual Visit via Telephone Note  I connected with Ashley Gomez on 08/23/20 at 11:00 AM EDT by telephone and verified that I am speaking with the correct person using two identifiers.  Location: Patient: home Provider: home office   I discussed the limitations, risks, security and privacy concerns of performing an evaluation and management service by telephone and the availability of in person appointments. I also discussed with the patient that there may be a patient responsible charge related to this service. The patient expressed understanding and agreed to proceed.   I discussed the assessment and treatment plan with the patient. The patient was provided an opportunity to ask questions and all were answered. The patient agreed with the plan and demonstrated an understanding of the instructions.   The patient was advised to call back or seek an in-person evaluation if the symptoms worsen or if the condition fails to improve as anticipated.  I provided 52 minutes of non-face-to-face time during this encounter.  THERAPIST PROGRESS NOTE  Session Time: 11:00 AM to 11:52 AM  Participation Level: Active  Behavioral Response: CasualAlertAnxious, Depressed and Euthymic  Type of Therapy: Individual Therapy  Treatment Goals addressed: supportive interventions continue to be significant intervention for patient and continue to work on management decreasing depression, anxiety, stress management, trauma, grief, coping  Interventions: Solution Focused, Strength-based, Supportive and Other: coping, grief  Summary: Ashley Gomez is a 50 y.o. female who presents with update on Ashley Gomez upped the Vyvanse twice a little better, still attitude of do what I want  when I want. [facility] went this month and last month to stay at sister's. Peaceful and get away. Going to try to do this more often if fiances good and car is keeps up with her. Helps her but also things about what she has come back  to. Tries to get Ashley Gomez to spend time to connect with Ashley Gomez and the boys and Ashley Gomez. She says she hates Ashley Gomez. She doesn't want anything to do with her. When around her she brings that back to patient. She thinks she can be hateful to patient and has to speak up. She is better except about Ashley Gomez. Dad in hospital before New Year's. Sister and husband came to see him. Patient sees him at least once a week. Day before yesterday thought he passed. Scared her. Yesterday different story PT and wide awake. He went to hospital and had pneumonia didn't expect him to make it. Now double pneumonia again. Last stage of COPD. Not sure everything going on medically. When leaves hospital transfer to rehab center and say doing better will need to do PT again has been laying for 4 months. Has a trachea in but breathing on his own. Most interaction with him since teenagers. Talks to him and read lips and take picture. Happy to see him glad when doing good worries when something bad doesn't try to think about missing 50 years with him. Has seen Mom a year March 6 hasn't seen her. Mom says to him things aren't true about what patient says. She is totally different when comes to talking to Ashley Gomez (Dad). She is like a teenager and wants his attention. Nobody else gets his attention. Patient has gone though a lot and she hasn't been there. Only was been there have been sister and brother. Brother sent messenger that he had tried to OD from muscle relaxer. He has dystonia where neck and head turns to one side. Patient called 911. He is going  be committed. He said can't take the pain anymore. He can't play with kids. Can't do things with them. He doesn't want to live like that. Patient said lost people to share what it would be like to lose him. Know feels bad. Guided patient that she has first hand knowledge to help him. He talked how mom is with them how did they deserve a mom like that. Patient says they care more than she does.  Think about all lost and all gone through last year, skin surgery on arm, torn spleen, shoulder surgery, sister heart attack, son being in the wreck, losing Ashley Gomez, not knowing if son paralyzed, losing people and mom not there although used to mom not being there. Son doing well, came back for a month, had his kids and she was there with them and patient not sure what do if go back to [place]. Back to talking about her mom and looking at grand kids and she doesn't know anything about them. Can't understand how people can be like that. Mom didn't want them and tried to give them away to children's home at age 29. Brother different story, she is not close to his kids either. Make an effort to see him 4-6 months. His 52 year old doesn't know her. Thinks when pregnant with patient and sister she acted like ruin her life. Guided patient in ways she has learned to cope not to expect anything had to learn at early age mom didn't care and want them. Grandmother and grandpa raised best people on the face of earth. Guided patient she got nurturing from another caretaker "they made [name] who we are today." With them until when 3-4, back to mom she tried to take to children's home, remember everything about it. Mad that they weren't keeping [name]. Remember like yesterday. Try stop dwelling on understanding her and how she is. [place] kids are doing great. Son's boy baby Ashley Gomez he has shown signs of autism-52 years old. He has to be consistent with him, loud noises. Doctor says signs of being autistic and also thinks bipolar. Wonders if can diagnose with bipolar he is a typical boy with patient. Wants to go there with them when see specialist. Has a schedule with patient not with other grandmother. Other grandmother puts them them to bed 5:30 PM then they are waking up in the middle of night and stay up. Not enough sleep. 23 month and 52 year old to bed. She can't handle them they are coming for a couple of months. Guided patient they are  not getting good parenting. Wants them in daycare interact with kids his age. Worries about them and this affects her everyday. If could take them all in could. Other grandmother got custody. Can see anytime she wants if drive to her house. 4 hours away. [name] always on computer, kids watch a show for learning but that all they know. Three adults in the house. Will speak up at appointments. Doing good still has depression says thinks about [name] wishing she was there and compare [name] to her.  Patient was very insightful at end of session and said she has to take care of her before everything and has to learn she can't fix anything.   Therapist reviewed symptoms, facilitated expression of thoughts and feelings encourage patient in ways she can take care of herself that she herself identified that was significant in session when she said she had to take care of herself.  Her chapter for example  to get away to her sisters more where she has some peace.  No other significant insight that therapist felt very therapeutic with and patient said coming to realization that she cannot fix everything.  Assess her feelings related to parenting grandchildren utilize this as significant intervention to help cope with her feelings and engaged in problem solving around the stressor patient speaking up about good parenting as well as speaking up when they meet with provider.  Noted patient has good ideas about parenting to encourage on the other grandparent.  In terms of processing feelings related to her own parents therapist encourages patient to channel these feelings in a positive direction can help such as although her mom did not give her the love she needed that she can be a good parent and grand parent.  Discussed grief for her daughter and reminding patient that she has not lost her but carries her in her heart.  Therapist provided active listening, open questions, supportive interventions.  Suicidal/Homicidal:  No  Plan: Return again in 3 weeks.2.  Processed through patient's feelings related to her mother in the past, processed feelings to help with coping with stressors, coping, help patient ways she can take care of herself and healthy attitudes for self such as realizing she cannot fix everything  Diagnosis: Axis I: generalized anxiety disorder PTSD, complicated grief, chronic pain syndrome, Major depressive disorder, recurrent moderate    Axis II: No diagnosis    Cordella Register, LCSW 08/23/2020

## 2020-08-25 DIAGNOSIS — D735 Infarction of spleen: Secondary | ICD-10-CM | POA: Diagnosis not present

## 2020-08-25 DIAGNOSIS — I10 Essential (primary) hypertension: Secondary | ICD-10-CM | POA: Diagnosis not present

## 2020-08-25 DIAGNOSIS — Z7901 Long term (current) use of anticoagulants: Secondary | ICD-10-CM | POA: Diagnosis not present

## 2020-08-25 DIAGNOSIS — Z86718 Personal history of other venous thrombosis and embolism: Secondary | ICD-10-CM | POA: Diagnosis not present

## 2020-09-06 DIAGNOSIS — J320 Chronic maxillary sinusitis: Secondary | ICD-10-CM | POA: Diagnosis not present

## 2020-09-06 DIAGNOSIS — J342 Deviated nasal septum: Secondary | ICD-10-CM | POA: Diagnosis not present

## 2020-09-09 DIAGNOSIS — R030 Elevated blood-pressure reading, without diagnosis of hypertension: Secondary | ICD-10-CM | POA: Diagnosis not present

## 2020-09-09 DIAGNOSIS — M545 Low back pain, unspecified: Secondary | ICD-10-CM | POA: Diagnosis not present

## 2020-09-09 DIAGNOSIS — Z79899 Other long term (current) drug therapy: Secondary | ICD-10-CM | POA: Diagnosis not present

## 2020-09-09 DIAGNOSIS — G8929 Other chronic pain: Secondary | ICD-10-CM | POA: Diagnosis not present

## 2020-09-13 ENCOUNTER — Other Ambulatory Visit: Payer: Self-pay

## 2020-09-13 ENCOUNTER — Ambulatory Visit (HOSPITAL_BASED_OUTPATIENT_CLINIC_OR_DEPARTMENT_OTHER)
Admission: RE | Admit: 2020-09-13 | Discharge: 2020-09-13 | Disposition: A | Discharge status: Home / Self Care | Payer: Medicare Other | Source: Ambulatory Visit | Attending: Family Medicine | Admitting: Family Medicine

## 2020-09-13 DIAGNOSIS — R238 Other skin changes: Secondary | ICD-10-CM | POA: Diagnosis not present

## 2020-09-15 ENCOUNTER — Ambulatory Visit (INDEPENDENT_AMBULATORY_CARE_PROVIDER_SITE_OTHER): Payer: Medicare Other | Admitting: Licensed Clinical Social Worker

## 2020-09-15 DIAGNOSIS — F411 Generalized anxiety disorder: Secondary | ICD-10-CM | POA: Diagnosis not present

## 2020-09-15 DIAGNOSIS — F331 Major depressive disorder, recurrent, moderate: Secondary | ICD-10-CM

## 2020-09-15 DIAGNOSIS — F4321 Adjustment disorder with depressed mood: Secondary | ICD-10-CM | POA: Diagnosis not present

## 2020-09-15 DIAGNOSIS — G894 Chronic pain syndrome: Secondary | ICD-10-CM

## 2020-09-15 DIAGNOSIS — F4310 Post-traumatic stress disorder, unspecified: Secondary | ICD-10-CM

## 2020-09-15 NOTE — Progress Notes (Signed)
Virtual Visit via Telephone Note  I connected with Ashley Gomez on 09/15/20 at 11:00 AM EDT by telephone and verified that I am speaking with the correct person using two identifiers.  Location: Patient: home Provider: home office   I discussed the limitations, risks, security and privacy concerns of performing an evaluation and management service by telephone and the availability of in person appointments. I also discussed with the patient that there may be a patient responsible charge related to this service. The patient expressed understanding and agreed to proceed.   I discussed the assessment and treatment plan with the patient. The patient was provided an opportunity to ask questions and all were answered. The patient agreed with the plan and demonstrated an understanding of the instructions.   The patient was advised to call back or seek an in-person evaluation if the symptoms worsen or if the condition fails to improve as anticipated.  I provided 50 minutes of non-face-to-face time during this encounter.  THERAPIST PROGRESS NOTE  Session Time: 11:00 AM to 11:52 AM  Participation Level: Active  Behavioral Response: CasualAlertDysphoric,Anxous  Type of Therapy: Individual Therapy  Treatment Goals addressed:  supportive interventions continue to be significant intervention for patient and continue to work on management decreasing depression, anxiety, stress management, trauma, grief, coping Interventions: Solution Focused, Strength-based, Supportive and Other: coping  Summary: Ashley Gomez is a 50 y.o. female who presents with doing ok. Stays stressed with Ashley Gomez. She is on new medicine. Doesn't understand how she can be so hateful to patient and Ashley Gomez. Problems with going to school and getting up to go school doesn't know what to do, wants her to go back to Ashley Gomez until school out. She says she doesn't want to go back and will run away. Hurt herself or run away. Patient  shares she feels like she is the only one who is bothered by it. Doctor didn't seem to be bothered by it. Thinks where she got her anger and way she talks, learned from Ashley Gomez, who has developed this over the past 15 years. Even when doesn't do what supposed to do she still expects to do in return like taking her to skating and buy her things.  Encourage patient not to reward her unless she does what is asked then she knows she cannot miss behave without consequences. Second time in past two weeks missed school. Ashley Gomez has had to come over and make her go to school. Patient has told daughter, Ashley Gomez that things going on can't deal with causing problems with her stomach, stresses her out, headaches. Didn't go to school Monday or Tuesday. Thinks she get a stomach virus. Told her to go yesterday and gets up screaming at patient. Said her stomach hurt. Physical wrote her out for yesterday and gave her acid reflux medicine. Patient dealt with not her kids not going to school with Ashley Gomez and son and can't do it again.  Explored options such as treatment for mental health. Has seen Dr. Laurance Flatten who is managing her care, asked Ashley Gomez questions and she said don't like Ashley Gomez and won't live with her. Asked why she is rude and she said just the way she is. Doctor focuses on medicine and seems that is good enough, she is taking Vyvanse. Explored trying to get her into therapy again. When come home from school she is hateful. Patient was firm with her and told her not going skating and therapist reinforced this as good parenting skill. Discussed patient's own issues  feels she is getting used to medicine doctor said on best option. Concerned with her son coming this weekend, Ashley Gomez, concerned will be a fight and argument because he won't let anybody disrespectful her. Ashley Gomez doesn't care what anybody says and talks back. Afraid will hurt her but won't do on purpose.  Therapist guided patient in recognizing her sons aggressive  behavior has not can be helpful, make things worse and actually given there should not be any physical fighting as this is a destructive way of managing anger.  Doesn't know how she is going to react if goes to [name]. Ashley Gomez says she doesn't want to have any more birthdays. For patient she feels helpful to talk to therapist doesn't have anybody else talk to. Reviewed more stressors with her Ashley Gomez she sasses back. Flipping a switch with patient. Therapist guided patient in her Ashley Gomez's need to discuss the loss of her mom. Ashley Gomez has good days and bad days. Does describe slow progress but argue and doesn't want to talk to things. Patient shares she tires to stay positive when it comes to her and raising her right. She is going great in school, talks to Ashley Gomez. Ashley Gomez doesn't like talking to people. Told her if don't talk to write it down and she does a lot of writing and drawing. Patient has been to sisters twice to Ashley Gomez and enjoyed it. Try not to show grief when around her son he is having a hard time grieving Ashley Gomez, his fiancee. He was where patient was in the beginning. Year since gone. He is still not doing drugs. Not sure how going to go this weekend. Dad has messaged Ashley Gomez wanting to see the kids and hasn't seen them in 11 years. He was in jail when happened.  Patient does not think should see them now especially with Ashley Gomez being so angry. Therapist suggested may be asked them how they feel about seeing their dad. In review of symptoms patient said depression not the greatest but put on happy face for everyone Her grief she expressed when on her own into her pillow.  Therapist pointed out positives she can talk to therapist and patient agrees  Therapist reviewed symptoms, facilitated expression of thoughts and feelings as important intervention to help patient process through current stressors.  Worked on problem solving related to her Ashley Gomez who  is giving her behavior problems.  Discussed helpful parenting strategies including her Ashley Gomez following her direction if not consequences, not getting into arguments, finding time to listen and to work through issues when she is calm.  Noted addressing what appears to be both her ADHD and a grief issues and finding activities to help her start talking about grief.  Provided positive feedback for ways patient is handling it that she reinforces that she has to listen and will not argue with her, encouraged her to write about how she feels and draw since she does not want to talk to people.  Therapist suggested therapy given the current issues.  Discussed patient managing her depression on her own often, validated the difficulty of that, still there is a process of feeling it to heal it involved even on her own, sometimes it is her best option in managing her symptoms at the same time also why therapy can be so helpful.  Noted patient's strengths resources and resiliency with the family going through so much and patient is both grandmother and mom trying to guide and bring the support her family needs.  Therapist  provided active listening, open questions, supportive interventions.   Suicidal/Homicidal: No  Plan: Return again in 5 weeks.2.  Therapist work with patient on strategies for stressors including parenting, mood regulation, supportive interventions  Diagnosis: Axis I:   generalized anxiety disorder PTSD, complicated grief, chronic pain syndrome, Major depressive disorder, recurrent moderate    Axis II: No diagnosis    Cordella Register, LCSW 09/15/2020

## 2020-09-16 ENCOUNTER — Other Ambulatory Visit (HOSPITAL_COMMUNITY): Payer: Self-pay | Admitting: Psychiatry

## 2020-09-17 ENCOUNTER — Other Ambulatory Visit (HOSPITAL_COMMUNITY): Payer: Self-pay | Admitting: Psychiatry

## 2020-09-17 ENCOUNTER — Other Ambulatory Visit: Payer: Self-pay | Admitting: Family Medicine

## 2020-09-17 DIAGNOSIS — F331 Major depressive disorder, recurrent, moderate: Secondary | ICD-10-CM

## 2020-09-21 ENCOUNTER — Encounter: Payer: Self-pay | Admitting: Family Medicine

## 2020-09-21 DIAGNOSIS — R0789 Other chest pain: Secondary | ICD-10-CM | POA: Diagnosis not present

## 2020-09-21 DIAGNOSIS — Z7901 Long term (current) use of anticoagulants: Secondary | ICD-10-CM | POA: Diagnosis not present

## 2020-09-21 DIAGNOSIS — R569 Unspecified convulsions: Secondary | ICD-10-CM | POA: Diagnosis not present

## 2020-09-21 DIAGNOSIS — Z886 Allergy status to analgesic agent status: Secondary | ICD-10-CM | POA: Diagnosis not present

## 2020-09-21 DIAGNOSIS — Z8673 Personal history of transient ischemic attack (TIA), and cerebral infarction without residual deficits: Secondary | ICD-10-CM | POA: Diagnosis not present

## 2020-09-21 DIAGNOSIS — Z888 Allergy status to other drugs, medicaments and biological substances status: Secondary | ICD-10-CM | POA: Diagnosis not present

## 2020-09-21 DIAGNOSIS — Z86718 Personal history of other venous thrombosis and embolism: Secondary | ICD-10-CM | POA: Diagnosis not present

## 2020-09-21 DIAGNOSIS — Z85828 Personal history of other malignant neoplasm of skin: Secondary | ICD-10-CM | POA: Diagnosis not present

## 2020-09-21 DIAGNOSIS — Z79899 Other long term (current) drug therapy: Secondary | ICD-10-CM | POA: Diagnosis not present

## 2020-09-21 DIAGNOSIS — Z794 Long term (current) use of insulin: Secondary | ICD-10-CM | POA: Diagnosis not present

## 2020-09-21 DIAGNOSIS — F17210 Nicotine dependence, cigarettes, uncomplicated: Secondary | ICD-10-CM | POA: Diagnosis not present

## 2020-09-21 DIAGNOSIS — J984 Other disorders of lung: Secondary | ICD-10-CM | POA: Diagnosis not present

## 2020-09-21 DIAGNOSIS — R079 Chest pain, unspecified: Secondary | ICD-10-CM | POA: Diagnosis not present

## 2020-09-21 DIAGNOSIS — I10 Essential (primary) hypertension: Secondary | ICD-10-CM | POA: Diagnosis not present

## 2020-09-21 DIAGNOSIS — G35 Multiple sclerosis: Secondary | ICD-10-CM | POA: Diagnosis not present

## 2020-09-21 DIAGNOSIS — Z885 Allergy status to narcotic agent status: Secondary | ICD-10-CM | POA: Diagnosis not present

## 2020-09-21 DIAGNOSIS — F32A Depression, unspecified: Secondary | ICD-10-CM | POA: Diagnosis not present

## 2020-09-21 DIAGNOSIS — K219 Gastro-esophageal reflux disease without esophagitis: Secondary | ICD-10-CM | POA: Diagnosis not present

## 2020-09-23 DIAGNOSIS — I10 Essential (primary) hypertension: Secondary | ICD-10-CM | POA: Diagnosis not present

## 2020-09-23 DIAGNOSIS — R079 Chest pain, unspecified: Secondary | ICD-10-CM | POA: Diagnosis not present

## 2020-09-24 DIAGNOSIS — R079 Chest pain, unspecified: Secondary | ICD-10-CM | POA: Diagnosis not present

## 2020-09-24 DIAGNOSIS — I10 Essential (primary) hypertension: Secondary | ICD-10-CM | POA: Diagnosis not present

## 2020-09-29 ENCOUNTER — Other Ambulatory Visit: Payer: Self-pay

## 2020-09-29 ENCOUNTER — Ambulatory Visit (INDEPENDENT_AMBULATORY_CARE_PROVIDER_SITE_OTHER): Payer: Medicare Other

## 2020-09-29 ENCOUNTER — Ambulatory Visit (INDEPENDENT_AMBULATORY_CARE_PROVIDER_SITE_OTHER): Payer: Medicare Other | Admitting: Sports Medicine

## 2020-09-29 ENCOUNTER — Telehealth (INDEPENDENT_AMBULATORY_CARE_PROVIDER_SITE_OTHER): Payer: Medicare Other | Admitting: Psychiatry

## 2020-09-29 ENCOUNTER — Encounter (HOSPITAL_COMMUNITY): Payer: Self-pay | Admitting: Psychiatry

## 2020-09-29 DIAGNOSIS — M549 Dorsalgia, unspecified: Secondary | ICD-10-CM | POA: Diagnosis not present

## 2020-09-29 DIAGNOSIS — M25562 Pain in left knee: Secondary | ICD-10-CM

## 2020-09-29 DIAGNOSIS — M5136 Other intervertebral disc degeneration, lumbar region: Secondary | ICD-10-CM

## 2020-09-29 DIAGNOSIS — Z96642 Presence of left artificial hip joint: Secondary | ICD-10-CM | POA: Diagnosis not present

## 2020-09-29 DIAGNOSIS — M1712 Unilateral primary osteoarthritis, left knee: Secondary | ICD-10-CM | POA: Diagnosis not present

## 2020-09-29 DIAGNOSIS — M545 Low back pain, unspecified: Secondary | ICD-10-CM | POA: Diagnosis not present

## 2020-09-29 DIAGNOSIS — M1611 Unilateral primary osteoarthritis, right hip: Secondary | ICD-10-CM | POA: Diagnosis not present

## 2020-09-29 DIAGNOSIS — M51369 Other intervertebral disc degeneration, lumbar region without mention of lumbar back pain or lower extremity pain: Secondary | ICD-10-CM

## 2020-09-29 DIAGNOSIS — Z96652 Presence of left artificial knee joint: Secondary | ICD-10-CM

## 2020-09-29 DIAGNOSIS — M5030 Other cervical disc degeneration, unspecified cervical region: Secondary | ICD-10-CM

## 2020-09-29 DIAGNOSIS — M160 Bilateral primary osteoarthritis of hip: Secondary | ICD-10-CM

## 2020-09-29 DIAGNOSIS — F411 Generalized anxiety disorder: Secondary | ICD-10-CM | POA: Diagnosis not present

## 2020-09-29 DIAGNOSIS — F331 Major depressive disorder, recurrent, moderate: Secondary | ICD-10-CM | POA: Diagnosis not present

## 2020-09-29 MED ORDER — PREDNISONE 50 MG PO TABS: ORAL_TABLET | ORAL | 0 refills | Status: DC

## 2020-09-29 NOTE — Assessment & Plan Note (Signed)
Neck pain with radiation into the periscapular region, classic cervical DDD. Adding x-rays, prednisone, home rehab exercises.

## 2020-09-29 NOTE — Progress Notes (Signed)
    Procedures performed today:    None.  Independent interpretation of notes and tests performed by another provider:   None.  Brief History, Exam, Impression, and Recommendations:    Lumbar degenerative disc disease Increasing axial low back pain worse with standing, known lumbar DDD, getting updated x-rays, home rehab, 5 days of prednisone.   DDD (degenerative disc disease), cervical Neck pain with radiation into the periscapular region, classic cervical DDD. Adding x-rays, prednisone, home rehab exercises.  Status post left partial knee replacement History of left partial knee replacement. Getting some updated x-rays today. Considering persistent pain she may need conversion to a total, I would like her to get a second opinion from Dr. Mardelle Matte.  Primary osteoarthritis of both hips Left hip continues to do well after an injection sometime ago, she is having right-sided pain now. Pain is referred to the groin consistent with a hip joint pain generator. Adding some x-rays, the prednisone we are going to use to treat her spinal processes will help her hip as well, if insufficient improvement over the next month we will consider hip joint injection on the right.  Morbid obesity ([place]) Morbid obesity with multiple comorbidities, currently doing medical weight loss at [facility]. She really needs to lose over 100 pounds which I do not think is good to happen unless we set her up for bariatric surgery. Referral placed.    ___________________________________________ Gwen Her. Dianah Field, M.D., ABFM., CAQSM. Primary Care and [place] [employer] of [place] [facility]

## 2020-09-29 NOTE — Assessment & Plan Note (Signed)
Left hip continues to do well after an injection sometime ago, she is having right-sided pain now. Pain is referred to the groin consistent with a hip joint pain generator. Adding some x-rays, the prednisone we are going to use to treat her spinal processes will help her hip as well, if insufficient improvement over the next month we will consider hip joint injection on the right.

## 2020-09-29 NOTE — Assessment & Plan Note (Signed)
Morbid obesity with multiple comorbidities, currently doing medical weight loss at [facility]. She really needs to lose over 100 pounds which I do not think is good to happen unless we set her up for bariatric surgery. Referral placed.

## 2020-09-29 NOTE — Assessment & Plan Note (Signed)
History of left partial knee replacement. Getting some updated x-rays today. Considering persistent pain she may need conversion to a total, I would like her to get a second opinion from Dr. Mardelle Matte.
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
## 2020-09-29 NOTE — Assessment & Plan Note (Signed)
Increasing axial low back pain worse with standing, known lumbar DDD, getting updated x-rays, home rehab, 5 days of prednisone.

## 2020-10-01 ENCOUNTER — Encounter (HOSPITAL_BASED_OUTPATIENT_CLINIC_OR_DEPARTMENT_OTHER): Payer: Medicare Other

## 2020-10-03 ENCOUNTER — Other Ambulatory Visit (HOSPITAL_COMMUNITY): Payer: Self-pay | Admitting: Psychiatry

## 2020-10-03 ENCOUNTER — Other Ambulatory Visit: Payer: Self-pay | Admitting: Family Medicine

## 2020-10-06 ENCOUNTER — Encounter: Payer: Self-pay | Admitting: Family Medicine

## 2020-10-07 ENCOUNTER — Encounter: Payer: Self-pay | Admitting: Family Medicine

## 2020-10-07 ENCOUNTER — Telehealth (INDEPENDENT_AMBULATORY_CARE_PROVIDER_SITE_OTHER): Payer: Medicare Other | Admitting: Family Medicine

## 2020-10-07 VITALS — Ht 66.0 in | Wt 289.0 lb

## 2020-10-07 DIAGNOSIS — F411 Generalized anxiety disorder: Secondary | ICD-10-CM

## 2020-10-07 DIAGNOSIS — J329 Chronic sinusitis, unspecified: Secondary | ICD-10-CM | POA: Diagnosis not present

## 2020-10-07 DIAGNOSIS — R059 Cough, unspecified: Secondary | ICD-10-CM | POA: Diagnosis not present

## 2020-10-07 DIAGNOSIS — J40 Bronchitis, not specified as acute or chronic: Secondary | ICD-10-CM

## 2020-10-07 MED ORDER — AZELASTINE HCL 0.1 % NA SOLN: 2.0000 | Freq: Two times a day (BID) | NASAL | 2 refills | Status: DC

## 2020-10-07 MED ORDER — AMOXICILLIN-POT CLAVULANATE 875-125 MG PO TABS: 1.0000 | ORAL_TABLET | Freq: Two times a day (BID) | ORAL | 0 refills | Status: AC

## 2020-10-07 MED ORDER — PROMETHAZINE-DM 6.25-15 MG/5ML PO SYRP: 2.5000 mL | ORAL_SOLUTION | Freq: Four times a day (QID) | ORAL | 0 refills | Status: AC | PRN

## 2020-10-07 NOTE — Progress Notes (Signed)
Virtual Video Visit via MyChart Note converted to telephone visit  I connected with  [name] on 10/07/20 at  8:50 AM EDT by the video enabled telemedicine application for MyChart, and verified that I am speaking with the correct person using two identifiers.   I introduced myself as a Designer, jewellery with the practice. We discussed the limitations of evaluation and management by telemedicine and the availability of in person appointments. The patient expressed understanding and agreed to proceed.  Participating parties in this visit include: The patient and the nurse practitioner listed.  The patient is: At home I am: In the office - [facility]  Subjective:    CC:  Chief Complaint  Patient presents with  . Cough  . Headache    HPI: Ashley Gomez is a 50 y.o. year old female presenting today via [place] today for cough and headache.  Patient reports about 8 days ago she started feeling sick with sinus pressure, headache and nausea, body aches, fatigue, stating she felt like "she got hit by a brick." She took a COVID test last Friday and it was negative. Symptoms started to ease up a little bit a few nights ago and then worsened again. She is having 7/10 pansinusitis pain with headaches, rhinorrhea, dry cough that is worse at night. She denies any fevers, ear pain, chest pain, dyspnea, nausea, vomiting, diarrhea. She has had some mild relief with Dayquil, Nyquil, Sudafed.   Recurrent sinusitis - scheduled for surgery on June 10th. States this feels like her typical sinus infection, but is not responding to conservative measures and lasting longer than usual.    Past medical history, Surgical history, Family history not pertinant except as noted below, Social history, Allergies, and medications have been entered into the medical record, reviewed, and corrections made.   Review of Systems:  All review of systems negative except what is listed in the  HPI   Objective:    General:  Speaking clearly in complete sentences. Absent shortness of breath noted.   Alert and oriented x3.   Normal judgment.  Absent acute distress.   Impression and Recommendations:    1. Sinobronchitis 2. Cough  Patient with double-sickening and duration/severity consistent with sinusitis. No significant improvement with conservative management. Will treat with Augmentin. She is requesting Astelin nose spray as this has worked well for her in the past. Also sending in some cough syrup for relief. Recommend she continue supportive therapies including OTC analgesia, rest, hydration, humidifier use, warm liquids with honey/lemon. Patient aware of signs/symptoms requiring further/urgent evaluation.  - amoxicillin-clavulanate (AUGMENTIN) 875-125 MG tablet; Take 1 tablet by mouth 2 (two) times daily for 5 days.  Dispense: 10 tablet; Refill: 0 - azelastine (ASTELIN) 0.1 % nasal spray; Place 2 sprays into both nostrils 2 (two) times daily. Use in each nostril as directed  Dispense: 301 mL; Refill: 2 - promethazine-dextromethorphan (PROMETHAZINE-DM) 6.25-15 MG/5ML syrup; Take 2.5 mLs by mouth 4 (four) times daily as needed for up to 5 days for cough.  Dispense: 50 mL; Refill: 0    Follow-up if symptoms worsen or fail to improve.    I discussed the assessment and treatment plan with the patient. The patient was provided an opportunity to ask questions and all were answered. The patient agreed with the plan and demonstrated an understanding of the instructions.   The patient was advised to call back or seek an in-person evaluation if the symptoms worsen or if the condition fails to improve  as anticipated.  I provided 20 minutes of non-face-to-face interaction with this [place] visit including intake, same-day documentation, and chart review.   Terrilyn Saver, NP

## 2020-10-07 NOTE — Telephone Encounter (Signed)
Pt has virtual appointment with Caleen Jobs, DNP today.

## 2020-10-08 ENCOUNTER — Telehealth: Payer: Self-pay | Admitting: Family Medicine

## 2020-10-08 ENCOUNTER — Other Ambulatory Visit: Payer: Self-pay | Admitting: Medical-Surgical

## 2020-10-08 DIAGNOSIS — G8929 Other chronic pain: Secondary | ICD-10-CM | POA: Diagnosis not present

## 2020-10-08 DIAGNOSIS — Z79899 Other long term (current) drug therapy: Secondary | ICD-10-CM | POA: Diagnosis not present

## 2020-10-08 DIAGNOSIS — Z20822 Contact with and (suspected) exposure to covid-19: Secondary | ICD-10-CM | POA: Diagnosis not present

## 2020-10-08 DIAGNOSIS — M62838 Other muscle spasm: Secondary | ICD-10-CM | POA: Diagnosis not present

## 2020-10-08 DIAGNOSIS — M545 Low back pain, unspecified: Secondary | ICD-10-CM | POA: Diagnosis not present

## 2020-10-08 DIAGNOSIS — F411 Generalized anxiety disorder: Secondary | ICD-10-CM

## 2020-10-08 MED ORDER — CLONAZEPAM 1 MG PO TABS: ORAL_TABLET | ORAL | 1 refills | Status: DC

## 2020-10-08 NOTE — Progress Notes (Signed)
  Chronic Care Management   Outreach Note  10/08/2020 Name: Ashley Gomez MRN: [id number] DOB: 04-May-1971  Referred by: Hali Marry, MD Reason for referral : No chief complaint on file.   An unsuccessful telephone outreach was attempted today. The patient was referred to the pharmacist for assistance with care management and care coordination.   Follow Up Plan:   SIGNATURE

## 2020-10-08 NOTE — Chronic Care Management (AMB) (Signed)
  Chronic Care Management   Outreach Note  10/08/2020 Name: Ashley Gomez MRN: [id number] DOB: Aug 08, 1970  Referred by: Hali Marry, MD Reason for referral : No chief complaint on file.   An unsuccessful telephone outreach was attempted today. The patient was referred to the pharmacist for assistance with care management and care coordination.   Follow Up Plan:   Lauretta Grill Up[employer] Scheduler

## 2020-10-08 NOTE — Telephone Encounter (Signed)
Previous message was routed to provider.

## 2020-10-08 NOTE — Telephone Encounter (Signed)
Routing to PCP

## 2020-10-13 ENCOUNTER — Other Ambulatory Visit: Payer: Self-pay | Admitting: Family Medicine

## 2020-10-13 DIAGNOSIS — J320 Chronic maxillary sinusitis: Secondary | ICD-10-CM | POA: Diagnosis not present

## 2020-10-14 ENCOUNTER — Ambulatory Visit (INDEPENDENT_AMBULATORY_CARE_PROVIDER_SITE_OTHER): Payer: Self-pay | Admitting: Licensed Clinical Social Worker

## 2020-10-14 DIAGNOSIS — F4321 Adjustment disorder with depressed mood: Secondary | ICD-10-CM

## 2020-10-14 DIAGNOSIS — G894 Chronic pain syndrome: Secondary | ICD-10-CM

## 2020-10-14 DIAGNOSIS — F4310 Post-traumatic stress disorder, unspecified: Secondary | ICD-10-CM

## 2020-10-14 DIAGNOSIS — F331 Major depressive disorder, recurrent, moderate: Secondary | ICD-10-CM

## 2020-10-14 DIAGNOSIS — F411 Generalized anxiety disorder: Secondary | ICD-10-CM

## 2020-10-14 NOTE — Progress Notes (Signed)
  Therapist contacted patient by phone and left a message for session. Session is a no show

## 2020-10-15 ENCOUNTER — Ambulatory Visit (INDEPENDENT_AMBULATORY_CARE_PROVIDER_SITE_OTHER): Payer: Medicare Other

## 2020-10-15 ENCOUNTER — Encounter: Payer: Self-pay | Admitting: *Deleted

## 2020-10-15 ENCOUNTER — Other Ambulatory Visit: Payer: Self-pay

## 2020-10-15 ENCOUNTER — Ambulatory Visit (INDEPENDENT_AMBULATORY_CARE_PROVIDER_SITE_OTHER): Payer: Medicare Other | Admitting: Podiatry

## 2020-10-15 ENCOUNTER — Encounter: Payer: Self-pay | Admitting: Podiatry

## 2020-10-15 ENCOUNTER — Other Ambulatory Visit: Payer: Self-pay | Admitting: *Deleted

## 2020-10-15 DIAGNOSIS — M79672 Pain in left foot: Secondary | ICD-10-CM

## 2020-10-15 DIAGNOSIS — I7300 Raynaud's syndrome without gangrene: Secondary | ICD-10-CM

## 2020-10-15 DIAGNOSIS — M7731 Calcaneal spur, right foot: Secondary | ICD-10-CM | POA: Diagnosis not present

## 2020-10-15 DIAGNOSIS — M778 Other enthesopathies, not elsewhere classified: Secondary | ICD-10-CM

## 2020-10-15 DIAGNOSIS — M19072 Primary osteoarthritis, left ankle and foot: Secondary | ICD-10-CM | POA: Diagnosis not present

## 2020-10-15 DIAGNOSIS — G629 Polyneuropathy, unspecified: Secondary | ICD-10-CM

## 2020-10-15 DIAGNOSIS — M79671 Pain in right foot: Secondary | ICD-10-CM

## 2020-10-15 DIAGNOSIS — M19071 Primary osteoarthritis, right ankle and foot: Secondary | ICD-10-CM | POA: Diagnosis not present

## 2020-10-15 DIAGNOSIS — M7732 Calcaneal spur, left foot: Secondary | ICD-10-CM | POA: Diagnosis not present

## 2020-10-15 NOTE — Patient Instructions (Signed)

## 2020-10-17 ENCOUNTER — Encounter: Payer: Self-pay | Admitting: Podiatry

## 2020-10-19 ENCOUNTER — Ambulatory Visit (HOSPITAL_COMMUNITY): Payer: Medicare Other | Admitting: Licensed Clinical Social Worker

## 2020-10-21 ENCOUNTER — Encounter (INDEPENDENT_AMBULATORY_CARE_PROVIDER_SITE_OTHER): Payer: Medicare Other

## 2020-10-21 DIAGNOSIS — M5136 Other intervertebral disc degeneration, lumbar region: Secondary | ICD-10-CM | POA: Diagnosis not present

## 2020-10-21 DIAGNOSIS — M51369 Other intervertebral disc degeneration, lumbar region without mention of lumbar back pain or lower extremity pain: Secondary | ICD-10-CM

## 2020-10-21 NOTE — Telephone Encounter (Signed)
I spent 5 total minutes of online digital evaluation and management services. 

## 2020-10-22 NOTE — Progress Notes (Signed)
Subjective:   Patient ID: Ashley Gomez, female   DOB: 50 y.o.   MRN: [id number]   HPI 50 year old female presents the office today for concerns of pins and needle feeling in her toes.  She also states this is worse mostly at nighttime but is consistent throughout the day as well.  This is on both feet.  She does have a history of back issues and she has been following up with sports medicine for this.  She had x-rays of her back but not a recent MRI.  Also states that she has a "rash" on the tops of her feet.  Also notices discoloration to her toes at times.   Review of Systems  All other systems reviewed and are negative.    Past Medical History:  Diagnosis Date   Anxiety    Arthritis    CVA (cerebral infarction)    Old CVA on MRI brain   Depression    DVT (deep venous thrombosis) ([place]) 46/65/9935   L basilic vein    Facet hypertrophy of lumbar region    MRI 2007   Fibromyalgia    GERD (gastroesophageal reflux disease)    Hypertension    Migraines    MS (multiple sclerosis) (HCC)    Neuromuscular disorder (HCC)    Obesity    Pre-diabetes    PTSD (post-traumatic stress disorder)    Seizures ([place]) 2011   no seizure since onset   Smoking    Stroke [facility])    TIA, >8years ago    Past Surgical History:  Procedure Laterality Date   CHOLECYSTECTOMY  10/2017   CHONDROPLASTY Left 12/30/2014   Procedure: CHONDROPLASTY;  Surgeon: Leandrew Koyanagi, MD;  Location: [place];  Service: Orthopedics;  Laterality: Left;   KNEE ARTHROSCOPY Left 12/30/2014   Procedure: LEFT KNEE ARTHROSCOPY WITH   CHONDROPLASTY;  Surgeon: Leandrew Koyanagi, MD;  Location: [place];  Service: Orthopedics;  Laterality: Left;   PARTIAL KNEE ARTHROPLASTY Left 08/03/2016   Procedure: LEFT UNICOMPARTMENTAL KNEE ARTHROPLASTY;  Surgeon: Leandrew Koyanagi, MD;  Location: [place];  Service: Orthopedics;  Laterality: Left;   SHOULDER ARTHROSCOPY WITH BICEPSTENOTOMY Left 01/15/2020   Procedure: SHOULDER  ARTHROSCOPY WITH BICEPSTENOTOMY;  Surgeon: Hiram Gash, MD;  Location: [place];  Service: Orthopedics;  Laterality: Left;   SHOULDER ARTHROSCOPY WITH DISTAL CLAVICLE RESECTION Left 01/15/2020   Procedure: SHOULDER ARTHROSCOPY WITH DISTAL CLAVICULECTOMY;  Surgeon: Hiram Gash, MD;  Location: [place];  Service: Orthopedics;  Laterality: Left;   SHOULDER ARTHROSCOPY WITH ROTATOR CUFF REPAIR AND SUBACROMIAL DECOMPRESSION Left 01/15/2020   Procedure: SHOULDER ARTHROSCOPY DEBRIDMENT WITH ROTATOR CUFF REPAIR, SUBACROMIAL DECOMPRESSION WITH ACROMIPLASTY, AND BICEP TENOTOMY;  Surgeon: Hiram Gash, MD;  Location: [place];  Service: Orthopedics;  Laterality: Left;   TONSILLECTOMY     TUBAL LIGATION  1992     Current Outpatient Medications:    NON FORMULARY, [facility]  Circulation-#5, Disp: , Rfl:    ammonium lactate (LAC-HYDRIN) 12 % lotion, See admin instructions., Disp: , Rfl:    amoxicillin-clavulanate (AUGMENTIN) 875-125 MG tablet, Take 1 tablet by mouth 2 (two) times daily., Disp: , Rfl:    azelastine (ASTELIN) 0.1 % nasal spray, Place 2 sprays into both nostrils 2 (two) times daily. Use in each nostril as directed, Disp: 301 mL, Rfl: 2   BD PEN NEEDLE NANO 2ND GEN 32G X 4 MM MISC, , Disp: , Rfl:  busPIRone (BUSPAR) 7.5 MG tablet, TAKE 1 TABLET BY MOUTH TWICE A DAY AS NEEDED, Disp: 180 tablet, Rfl: 0   clobetasol cream (TEMOVATE) 0.05 %, Apply topically., Disp: , Rfl:    clonazePAM (KLONOPIN) 1 MG tablet, TAKE 1 TABLET TWICE A DAY AS NEEDED ANXIETY MUST LAST 30 DAYS, Disp: 45 tablet, Rfl: 1   cyclobenzaprine (FLEXERIL) 10 MG tablet, Take 10 mg by mouth 2 (two) times daily as needed., Disp: , Rfl:    dicyclomine (BENTYL) 20 MG tablet, Take 20 mg by mouth 3 (three) times daily., Disp: , Rfl:    DULoxetine (CYMBALTA) 30 MG capsule, TAKE 1 CAPSULE BY MOUTH DAILY ALONG WITH 60MG CAPSULE, Disp: 90 capsule, Rfl: 0   DULoxetine (CYMBALTA) 60  MG capsule, TAKE 1 CAPSULE BY MOUTH EVERY DAY, Disp: 90 capsule, Rfl: 0   famotidine (PEPCID) 20 MG tablet, TAKE 1 TABLET BY MOUTH EVERY DAY, Disp: 90 tablet, Rfl: 3   Galcanezumab-gnlm (EMGALITY) 120 MG/ML SOAJ, INJECT 240MG (2 ML) UNDER THE SKIN FOR 1 DOSE , THEN INJECT 120MG (1 ML) UNDER THE SKIN EVERY MONTH., Disp: 2 mL, Rfl: 3   HYDROcodone-acetaminophen (NORCO) 10-325 MG tablet, Take 1 tablet by mouth every 6 (six) hours as needed for pain. for pain, Disp: , Rfl: 0   levocetirizine (XYZAL) 5 MG tablet, Take 5 mg by mouth daily., Disp: , Rfl:    methocarbamol (ROBAXIN) 500 MG tablet, Take 1-2 tablets by mouth every 6 (six) hours as needed., Disp: , Rfl:    metoprolol succinate (TOPROL-XL) 25 MG 24 hr tablet, TAKE 1 TABLET BY MOUTH EVERY DAY, Disp: 90 tablet, Rfl: 1   NARCAN 4 MG/0.1ML LIQD nasal spray kit, USE AS DIRECTED, Disp: , Rfl:    omeprazole (PRILOSEC) 40 MG capsule, TAKE 1 CAPSULE BY MOUTH EVERY DAY, Disp: 90 capsule, Rfl: 3   ondansetron (ZOFRAN) 4 MG tablet, TAKE 1 TABLET EVERY 8 HOURS AS NEEDED FOR NAUSEA AND VOMITING, Disp: 10 tablet, Rfl: 1   predniSONE (DELTASONE) 20 MG tablet, Take 20 mg by mouth daily., Disp: , Rfl:    Rimegepant Sulfate (NURTEC) 75 MG TBDP, Take 1 tablet by mouth daily as needed., Disp: 8 tablet, Rfl: 1   VICTOZA 18 MG/3ML SOPN, Inject 1.8 mg into the skin daily., Disp: , Rfl: 1  Allergies  Allergen Reactions   Ace Inhibitors     REACTION: cough   Atenolol Other (See Comments)    Bradycardia   Celebrex [Celecoxib] Other (See Comments)    Swelling in extremities   Celexa [Citalopram Hydrobromide] Other (See Comments)    Dizziness Tolerated Lexapro    Codeine Nausea Only   Nitroglycerin Other (See Comments)    Drop in BP   Phenytoin Swelling   Sertraline Other (See Comments)    unknown   Sumatriptan Nausea Only   Tizanidine Other (See Comments)    Keep her awake.    Topamax [Topiramate] Other (See Comments)    She reports that this makes her  forgetful and causes her to studder   Triamcinolone     Steroid flare after joint injection, use other steroid for injections   Tramadol Anxiety and Palpitations    Social History   Socioeconomic History   Marital status: Single    Spouse name: Not on file   Number of children: Not on file   Years of education: Not on file   Highest education level: Not on file  Occupational History   Not on file  Tobacco Use  Smoking status: Every Day    Packs/day: 1.00    Years: 25.00    Pack years: 25.00    Types: Cigarettes   Smokeless tobacco: Never   Tobacco comments:    advised to d/c by 3 cigs per week.  Substance and Sexual Activity   Alcohol use: No    Alcohol/week: 0.0 standard drinks   Drug use: No   Sexual activity: Yes    Birth control/protection: None, Post-menopausal  Other Topics Concern   Not on file  Social History Narrative   Not on file   Social Determinants of Health   Financial Resource Strain: Not on file  Food Insecurity: Not on file  Transportation Needs: Not on file  Physical Activity: Not on file  Stress: Not on file  Social Connections: Not on file  Intimate Partner Violence: Not on file         Objective:  Physical Exam  General: AAO x3, NAD  Dermatological: No open lesions identified.  The toes do have a purple discoloration present there is no skin breakdown, necrosis.  Veins are present.  The rest that she is describing the top of her feet appears to be more from veins.    Vascular: Dorsalis Pedis artery and Posterior Tibial artery pedal pulses are 2/4 bilateral with immedate capillary fill time.  There is no pain with calf compression, swelling, warmth, erythema.   Neruologic: Grossly intact via light touch bilateral.  Describing diffuse burning, tingling to her toes.  Musculoskeletal: No specific area of pinpoint tenderness identified today.  Mild edema present bilaterally but there is no erythema or warmth.  Flexor, extensor tendons  appear to be intact.  Muscular strength 5/5 in all groups tested bilateral.  Gait: Unassisted, Nonantalgic.       Assessment:   Foot pain, neuritis possibly coming from her back, likely rainouts     Plan:  -Treatment options discussed including all alternatives, risks, and complications -Etiology of symptoms were discussed -X-rays were obtained and reviewed with the patient.  There is no evidence of acute fracture identified today.  Awaiting radiology report. -Reviewed experiencing Raynaud's.  She is recently had an arterial study which was normal.  I ordered a compound cream today through [employer] for circulation. -I think that she is also expensing nerve symptoms possibly coming from her back.  Recommended to follow-up with sports medicine about the x-ray of her back.  She is already seeing pain management as well.  I am not can add any medications at this time. -We discussed general stretching exercises for the feet as well.  Also discussed supportive shoe gear.  Trula Slade DPM

## 2020-10-29 ENCOUNTER — Other Ambulatory Visit: Payer: Self-pay | Admitting: Podiatry

## 2020-10-29 NOTE — Progress Notes (Signed)
error 

## 2020-10-31 ENCOUNTER — Ambulatory Visit (INDEPENDENT_AMBULATORY_CARE_PROVIDER_SITE_OTHER): Payer: Medicare Other

## 2020-10-31 ENCOUNTER — Other Ambulatory Visit: Payer: Self-pay

## 2020-10-31 DIAGNOSIS — M5136 Other intervertebral disc degeneration, lumbar region: Secondary | ICD-10-CM | POA: Diagnosis not present

## 2020-10-31 DIAGNOSIS — G8929 Other chronic pain: Secondary | ICD-10-CM | POA: Diagnosis not present

## 2020-10-31 DIAGNOSIS — M545 Low back pain, unspecified: Secondary | ICD-10-CM

## 2020-11-01 ENCOUNTER — Other Ambulatory Visit: Payer: Self-pay | Admitting: Family Medicine

## 2020-11-01 DIAGNOSIS — G43019 Migraine without aura, intractable, without status migrainosus: Secondary | ICD-10-CM

## 2020-11-05 ENCOUNTER — Telehealth: Payer: Self-pay | Admitting: *Deleted

## 2020-11-05 DIAGNOSIS — Z9189 Other specified personal risk factors, not elsewhere classified: Secondary | ICD-10-CM

## 2020-11-05 NOTE — Telephone Encounter (Signed)
Called and left a message for the patient to call me back and per Dr Jacqualyn Posey the compound cream was too expensive and Dr Jacqualyn Posey stated that we could try another compound company if the patient is ok with that and I stated to call the office [phone] or the [place] office Monday [phone]. Lattie Haw

## 2020-11-05 NOTE — Progress Notes (Signed)
[facility])                                            [place] Team                                        Statin Quality Measure Assessment    11/05/2020  Ashley Gomez Sep 04, 1970 [id number]  Per review of chart and payor information, this patient has been flagged for non-adherence to the following CMS Quality Measure:   [x]  Statin Use in Persons with Diabetes  - This patient has a history of pre-diabetes, but falls into this measure d/t being on Victoza for weight loss.  [x]  Statin Use in Persons with Cardiovascular Disease  - This patient has a history of aortic atherosclerosis but has not fallen into the CMS measure a result of dx code not being renewed for 2022.   The 10-year ASCVD risk score Mikey Bussing DC Brooke Bonito., et al., 2013) is: 8.9%   Values used to calculate the score:     Age: 50 years     Sex: Female     Is Non-Hispanic African American: No     Diabetic: No     Tobacco smoker: Yes     Systolic Blood Pressure: 626 mmHg     Is BP treated: Yes     HDL Cholesterol: 33 mg/dL     Total Cholesterol: 207 mg/dL LDL 98 mg/dL on 08/04/2019  Currently prescribed statin:  []  Yes [x]  No     Comments: N/A  History of statin use:            [x]  Yes []  No   Comments: Atorvastatin - it is unclear why this medication was stopped.   Please consider ONE of the following recommendations for SUPD (prediabetes coding) and SPC (statin start):   Initiate high intensity statin Atorvastatin 40mg  once daily, #90, 3 refills   Rosuvastatin 20mg  once daily, #90, 3 refills    Initiate moderate intensity          statin with reduced frequency if prior          statin intolerance 1x weekly, #13, 3 refills   2x weekly, #26, 3 refills   3x weekly, #39, 3 refills    Code for past statin intolerance or other exclusions (required annually)  Drug Induced Myopathy G72.0   Myositis, unspecified M60.9   Rhabdomyolysis M62.82    Prediabetes R73.03   Adverse effect of antihyperlipidemic and antiarteriosclerotic drugs, initial encounter R48.5I6E    Thank you for your time,  Kristeen Miss, [place] Cell: [phone]

## 2020-11-07 ENCOUNTER — Telehealth: Payer: Self-pay | Admitting: Family Medicine

## 2020-11-07 NOTE — Telephone Encounter (Signed)
Call pt: please see if she is willing to restart a statin. She is very high risk and would benefit greatly from being on a statin.  I now she was on atorvastatin before. We can try a different one if she didn't like that one.

## 2020-11-08 ENCOUNTER — Ambulatory Visit (INDEPENDENT_AMBULATORY_CARE_PROVIDER_SITE_OTHER): Payer: Medicare Other | Admitting: Family Medicine

## 2020-11-08 ENCOUNTER — Other Ambulatory Visit: Payer: Self-pay

## 2020-11-08 ENCOUNTER — Encounter: Payer: Self-pay | Admitting: Family Medicine

## 2020-11-08 VITALS — BP 126/70 | HR 85 | Ht 66.0 in | Wt 293.0 lb

## 2020-11-08 DIAGNOSIS — I700 Atherosclerosis of aorta: Secondary | ICD-10-CM

## 2020-11-08 DIAGNOSIS — I10 Essential (primary) hypertension: Secondary | ICD-10-CM

## 2020-11-08 DIAGNOSIS — G43019 Migraine without aura, intractable, without status migrainosus: Secondary | ICD-10-CM | POA: Diagnosis not present

## 2020-11-08 DIAGNOSIS — G8929 Other chronic pain: Secondary | ICD-10-CM | POA: Diagnosis not present

## 2020-11-08 DIAGNOSIS — F17210 Nicotine dependence, cigarettes, uncomplicated: Secondary | ICD-10-CM | POA: Diagnosis not present

## 2020-11-08 DIAGNOSIS — F411 Generalized anxiety disorder: Secondary | ICD-10-CM

## 2020-11-08 DIAGNOSIS — Z79899 Other long term (current) drug therapy: Secondary | ICD-10-CM | POA: Diagnosis not present

## 2020-11-08 DIAGNOSIS — M62838 Other muscle spasm: Secondary | ICD-10-CM | POA: Diagnosis not present

## 2020-11-08 DIAGNOSIS — M545 Low back pain, unspecified: Secondary | ICD-10-CM | POA: Diagnosis not present

## 2020-11-08 MED ORDER — ATORVASTATIN CALCIUM 20 MG PO TABS: 20.0000 mg | ORAL_TABLET | Freq: Every day | ORAL | 3 refills | Status: DC

## 2020-11-08 NOTE — Patient Instructions (Addendum)
We could also try Ajovy or Ubrevly or Qulipta , in place of the [employer]

## 2020-11-08 NOTE — Progress Notes (Signed)
Established Patient Office Visit  Subjective:  Patient ID: Ashley Gomez, female    DOB: [birth year]  Age: 50 y.o. MRN: [id number]  CC:  Chief Complaint  Patient presents with   Follow-up    HPI Ashley Gomez presents for f/u Migraines.  She is currently on Emgality and 20 mg monthly.  She uses Nurtec for rescue.  She is also on a beta-blocker.  She reports that she still having headaches daily though she does feel like the Emgality does help some.  Tried multiple other medications for her migraines.  Please see if she is willing to restart a statin. She is very high risk and would benefit greatly from being on a statin.  I now she was on atorvastatin before. We can try a different one if she didn't like that one.  Saw pain management today and he is starting her baclofen.  He has also recommended potentially switching her clonazepam to Valium instead for its muscle relaxing effects.  She will be stopping her Flexeril.  She sees Dr. Arlie Solomons at [place] in [facility].  He did have her back MRI with Dr. Dianah Field and it showed some disc issues at L4, L5 and S1.  She also saw podiatry since I last saw her.  They do feel like she has some type of blood flow issue in her feet.  Dr. Earleen Newport, the podiatrist had prescribed some type of compounded cream she says she is going to try to get it though it is quite expensive but she is hopeful that it will help with her neuropathy.  She also wanted let me know she is scheduled for sinus surgery and her nuclear stress test in August.  Past Medical History:  Diagnosis Date   Anxiety    Arthritis    CVA (cerebral infarction)    Old CVA on MRI brain   Depression    DVT (deep venous thrombosis) ([place]) 70/05/7492   L basilic vein    Facet hypertrophy of lumbar region    MRI 2007   Fibromyalgia    GERD (gastroesophageal reflux disease)    Hypertension    Migraines    MS (multiple sclerosis) (HCC)    Neuromuscular disorder (H[facility])    Obesity     Pre-diabetes    PTSD (post-traumatic stress disorder)    Seizures ([place]) 2011   no seizure since onset   Smoking    Stroke [facility])    TIA, >8years ago    Past Surgical History:  Procedure Laterality Date   CHOLECYSTECTOMY  10/2017   CHONDROPLASTY Left 12/30/2014   Procedure: CHONDROPLASTY;  Surgeon: Leandrew Koyanagi, MD;  Location: [place];  Service: Orthopedics;  Laterality: Left;   KNEE ARTHROSCOPY Left 12/30/2014   Procedure: LEFT KNEE ARTHROSCOPY WITH   CHONDROPLASTY;  Surgeon: Leandrew Koyanagi, MD;  Location: [place];  Service: Orthopedics;  Laterality: Left;   PARTIAL KNEE ARTHROPLASTY Left 08/03/2016   Procedure: LEFT UNICOMPARTMENTAL KNEE ARTHROPLASTY;  Surgeon: Leandrew Koyanagi, MD;  Location: [place];  Service: Orthopedics;  Laterality: Left;   SHOULDER ARTHROSCOPY WITH BICEPSTENOTOMY Left 01/15/2020   Procedure: SHOULDER ARTHROSCOPY WITH BICEPSTENOTOMY;  Surgeon: Hiram Gash, MD;  Location: [place];  Service: Orthopedics;  Laterality: Left;   SHOULDER ARTHROSCOPY WITH DISTAL CLAVICLE RESECTION Left 01/15/2020   Procedure: SHOULDER ARTHROSCOPY WITH DISTAL CLAVICULECTOMY;  Surgeon: Hiram Gash, MD;  Location: [place];  Service: Orthopedics;  Laterality: Left;  SHOULDER ARTHROSCOPY WITH ROTATOR CUFF REPAIR AND SUBACROMIAL DECOMPRESSION Left 01/15/2020   Procedure: SHOULDER ARTHROSCOPY DEBRIDMENT WITH ROTATOR CUFF REPAIR, SUBACROMIAL DECOMPRESSION WITH ACROMIPLASTY, AND BICEP TENOTOMY;  Surgeon: Hiram Gash, MD;  Location: [place];  Service: Orthopedics;  Laterality: Left;   TONSILLECTOMY     TUBAL LIGATION  1992    Family History  Problem Relation Age of Onset   Cancer Mother 58       melanoma   Hypertension Sister    Heart disease Sister        valve disease   Depression Sister    Diabetes Sister    Sjogren's syndrome Sister     Social History   Socioeconomic History   Marital status: Single     Spouse name: Not on file   Number of children: Not on file   Years of education: Not on file   Highest education level: Not on file  Occupational History   Not on file  Tobacco Use   Smoking status: Every Day    Packs/day: 1.00    Years: 25.00    Pack years: 25.00    Types: Cigarettes   Smokeless tobacco: Never   Tobacco comments:    advised to d/c by 3 cigs per week.  Substance and Sexual Activity   Alcohol use: No    Alcohol/week: 0.0 standard drinks   Drug use: No   Sexual activity: Yes    Birth control/protection: None, Post-menopausal  Other Topics Concern   Not on file  Social History Narrative   Not on file   Social Determinants of Health   Financial Resource Strain: Not on file  Food Insecurity: Not on file  Transportation Needs: Not on file  Physical Activity: Not on file  Stress: Not on file  Social Connections: Not on file  Intimate Partner Violence: Not on file    Outpatient Medications Prior to Visit  Medication Sig Dispense Refill   ammonium lactate (LAC-HYDRIN) 12 % lotion See admin instructions.     azelastine (ASTELIN) 0.1 % nasal spray Place 2 sprays into both nostrils 2 (two) times daily. Use in each nostril as directed 301 mL 2   BD PEN NEEDLE NANO 2ND GEN 32G X 4 MM MISC      busPIRone (BUSPAR) 7.5 MG tablet TAKE 1 TABLET BY MOUTH TWICE A DAY AS NEEDED 180 tablet 0   clonazePAM (KLONOPIN) 1 MG tablet TAKE 1 TABLET TWICE A DAY AS NEEDED ANXIETY MUST LAST 30 DAYS 45 tablet 1   dicyclomine (BENTYL) 20 MG tablet Take 20 mg by mouth 3 (three) times daily.     DULoxetine (CYMBALTA) 30 MG capsule TAKE 1 CAPSULE BY MOUTH DAILY ALONG WITH 60MG CAPSULE 90 capsule 0   DULoxetine (CYMBALTA) 60 MG capsule TAKE 1 CAPSULE BY MOUTH EVERY DAY 90 capsule 0   famotidine (PEPCID) 20 MG tablet TAKE 1 TABLET BY MOUTH EVERY DAY 90 tablet 3   Galcanezumab-gnlm (EMGALITY) 120 MG/ML SOAJ INJECT 240MG (2 ML) UNDER THE SKIN FOR 1 DOSE , THEN INJECT 120MG (1 ML) UNDER THE  SKIN EVERY MONTH. 2 mL 3   HYDROcodone-acetaminophen (NORCO) 10-325 MG tablet Take 1 tablet by mouth every 6 (six) hours as needed for pain. for pain  0   levocetirizine (XYZAL) 5 MG tablet Take 5 mg by mouth daily.     metoprolol succinate (TOPROL-XL) 25 MG 24 hr tablet TAKE 1 TABLET BY MOUTH EVERY DAY 90 tablet 1   NARCAN  4 MG/0.1ML LIQD nasal spray kit USE AS DIRECTED     NON FORMULARY [facility]  Circulation-#5     NURTEC 75 MG TBDP TAKE 1 TABLET BY MOUTH EVERY DAY AS NEEDED 8 tablet 1   omeprazole (PRILOSEC) 40 MG capsule TAKE 1 CAPSULE BY MOUTH EVERY DAY 90 capsule 3   ondansetron (ZOFRAN) 4 MG tablet TAKE 1 TABLET EVERY 8 HOURS AS NEEDED FOR NAUSEA AND VOMITING 10 tablet 1   VICTOZA 18 MG/3ML SOPN Inject 1.8 mg into the skin daily.  1   cyclobenzaprine (FLEXERIL) 10 MG tablet Take 10 mg by mouth 2 (two) times daily as needed.     clobetasol cream (TEMOVATE) 0.05 % Apply topically.     methocarbamol (ROBAXIN) 500 MG tablet Take 1-2 tablets by mouth every 6 (six) hours as needed.     predniSONE (DELTASONE) 20 MG tablet Take 20 mg by mouth daily.     No facility-administered medications prior to visit.    Allergies  Allergen Reactions   Ace Inhibitors     REACTION: cough   Atenolol Other (See Comments)    Bradycardia   Celebrex [Celecoxib] Other (See Comments)    Swelling in extremities   Celexa [Citalopram Hydrobromide] Other (See Comments)    Dizziness Tolerated Lexapro    Codeine Nausea Only   Nitroglycerin Other (See Comments)    Drop in BP   Phenytoin Swelling   Sertraline Other (See Comments)    unknown   Sumatriptan Nausea Only   Tizanidine Other (See Comments)    Keep her awake.    Topamax [Topiramate] Other (See Comments)    She reports that this makes her forgetful and causes her to studder   Triamcinolone     Steroid flare after joint injection, use other steroid for injections   Tramadol Anxiety and Palpitations    ROS Review of Systems     Objective:    Physical Exam Constitutional:      Appearance: Normal appearance. She is well-developed.  HENT:     Head: Normocephalic and atraumatic.  Cardiovascular:     Rate and Rhythm: Normal rate and regular rhythm.     Heart sounds: Normal heart sounds.  Pulmonary:     Effort: Pulmonary effort is normal.     Breath sounds: Normal breath sounds.  Skin:    General: Skin is warm and dry.  Neurological:     Mental Status: She is alert and oriented to person, place, and time.  Psychiatric:        Behavior: Behavior normal.    LMP 12/28/2014  Wt Readings from Last 3 Encounters:  10/07/20 289 lb (131.1 kg)  08/05/20 289 lb (131.1 kg)  06/11/20 291 lb (132 kg)     Health Maintenance Due  Topic Date Due   COVID-19 Vaccine (1) Never done   Pneumococcal Vaccine 6-15 Years old (1 - PCV) Never done   Hepatitis C Screening  Never done   Zoster Vaccines- Shingrix (1 of 2) Never done   COLONOSCOPY (Pts 45-45yr Insurance coverage will need to be confirmed)  Never done   PAP SMEAR-Modifier  10/27/2020    There are no preventive care reminders to display for this patient.  Lab Results  Component Value Date   TSH 1.53 08/09/2020   Lab Results  Component Value Date   WBC 8.3 08/09/2020   HGB 14.9 08/09/2020   HCT 46.7 (H) 08/09/2020   MCV 85.4 08/09/2020   PLT 327 08/09/2020  Lab Results  Component Value Date   NA 137 08/09/2020   K 4.5 08/09/2020   CO2 27 08/09/2020   GLUCOSE 128 (H) 08/09/2020   BUN 14 08/09/2020   CREATININE 0.86 08/09/2020   BILITOT 0.3 08/09/2020   ALKPHOS 98 11/29/2016   AST 14 08/09/2020   ALT 9 08/09/2020   PROT 6.8 08/09/2020   ALBUMIN 3.6 11/29/2016   CALCIUM 9.0 08/09/2020   ANIONGAP 10 01/12/2020   Lab Results  Component Value Date   CHOL 207 (H) 08/09/2020   Lab Results  Component Value Date   HDL 33 (L) 08/09/2020   Lab Results  Component Value Date   [facility]  08/09/2020     Comment:     . LDL cholesterol not  calculated. Triglyceride levels greater than 400 mg/dL invalidate calculated LDL results. . Reference range: <100 . Desirable range <100 mg/dL for primary prevention;   <70 mg/dL for patients with CHD or diabetic patients  with > or = 2 CHD risk factors. Marland Kitchen LDL-C is now calculated using the Martin-Hopkins  calculation, which is a validated novel method providing  better accuracy than the Friedewald equation in the  estimation of LDL-C.  Cresenciano Genre et al. Annamaria Helling. 0962;836(62): 2061-2068  (http://education.[facility].com/faq/FAQ164)    Lab Results  Component Value Date   TRIG 422 (H) 08/09/2020   Lab Results  Component Value Date   CHOLHDL 6.3 (H) 08/09/2020   Lab Results  Component Value Date   HGBA1C 5.7 (H) 08/09/2020      Assessment & Plan:   Problem List Items Addressed This Visit       Cardiovascular and Mediastinum   Migraine without aura    Doing okay with the Emgality but still having daily headaches we discussed that there are couple other options out there that we could consider if she would like to write the names of those down.  He is already tried Aimovig.  She says the Nurtec helps some but it does not completely knock the headache out.       Relevant Medications   atorvastatin (LIPITOR) 20 MG tablet   ESSENTIAL HYPERTENSION, BENIGN - Primary   Relevant Medications   atorvastatin (LIPITOR) 20 MG tablet   Aortic atherosclerosis ([place])    Plan to restart atorvastatin she was on it previously and for some reason it was discontinued she does not remember having any specific problems or side effects and is willing to retry it.       Relevant Medications   atorvastatin (LIPITOR) 20 MG tablet     Other   Anxiety state    Currently uses clonazepam as needed.  I am happy to discuss with Dr. Arlie Solomons possibly switching to Valium to get more of a muscle relaxing effect.  That she is also going to be starting baclofen.        Meds ordered this  encounter  Medications   atorvastatin (LIPITOR) 20 MG tablet    Sig: Take 1 tablet (20 mg total) by mouth at bedtime.    Dispense:  90 tablet    Refill:  3   Encouraged her to schedule her Pap smear.  Follow-up: Return in about 3 months (around 02/08/2021) for A1C and medications .    Beatrice Lecher, MD

## 2020-11-08 NOTE — Assessment & Plan Note (Signed)
Doing okay with the Emgality but still having daily headaches we discussed that there are couple other options out there that we could consider if she would like to write the names of those down.  He is already tried Aimovig.  She says the Nurtec helps some but it does not completely knock the headache out.

## 2020-11-08 NOTE — Assessment & Plan Note (Signed)
Plan to restart atorvastatin she was on it previously and for some reason it was discontinued she does not remember having any specific problems or side effects and is willing to retry it.

## 2020-11-09 ENCOUNTER — Telehealth: Payer: Self-pay | Admitting: Family Medicine

## 2020-11-09 NOTE — Telephone Encounter (Signed)
Please call Dr. Arlie Solomons office at [facility] in [facility], Pain mgt, patient reports that he wanted me to consider switching her clonazepam to diazepam.  I just want to verify this information or maybe if he could send me a note that includes his current recommendations.

## 2020-11-09 NOTE — Assessment & Plan Note (Signed)
Currently uses clonazepam as needed.  I am happy to discuss with Dr. Arlie Solomons possibly switching to Valium to get more of a muscle relaxing effect.  That she is also going to be starting baclofen.

## 2020-11-10 ENCOUNTER — Ambulatory Visit: Payer: Medicare Other | Admitting: Sports Medicine

## 2020-11-10 ENCOUNTER — Encounter: Payer: Self-pay | Admitting: Family Medicine

## 2020-11-10 ENCOUNTER — Ambulatory Visit (INDEPENDENT_AMBULATORY_CARE_PROVIDER_SITE_OTHER): Payer: Medicare Other | Admitting: Licensed Clinical Social Worker

## 2020-11-10 DIAGNOSIS — F4321 Adjustment disorder with depressed mood: Secondary | ICD-10-CM | POA: Diagnosis not present

## 2020-11-10 DIAGNOSIS — F331 Major depressive disorder, recurrent, moderate: Secondary | ICD-10-CM | POA: Diagnosis not present

## 2020-11-10 DIAGNOSIS — G894 Chronic pain syndrome: Secondary | ICD-10-CM

## 2020-11-10 DIAGNOSIS — F411 Generalized anxiety disorder: Secondary | ICD-10-CM | POA: Diagnosis not present

## 2020-11-10 DIAGNOSIS — F4310 Post-traumatic stress disorder, unspecified: Secondary | ICD-10-CM

## 2020-11-10 NOTE — Progress Notes (Signed)
Virtual Visit via Telephone Note  I connected with Ashley Gomez on 11/10/20 at 11:00 AM EDT by telephone and verified that I am speaking with the correct person using two identifiers.  Location: Patient: home Provider: home office   I discussed the limitations, risks, security and privacy concerns of performing an evaluation and management service by telephone and the availability of in person appointments. I also discussed with the patient that there may be a patient responsible charge related to this service. The patient expressed understanding and agreed to proceed.  I discussed the assessment and treatment plan with the patient. The patient was provided an opportunity to ask questions and all were answered. The patient agreed with the plan and demonstrated an understanding of the instructions.   The patient was advised to call back or seek an in-person evaluation if the symptoms worsen or if the condition fails to improve as anticipated.  I provided 52 minutes of non-face-to-face time during this encounter.  THERAPIST PROGRESS NOTE  Session Time: 11:00 AM to 11:52 AM  Participation Level: Active  Behavioral Response: CasualAlertAnxious  Type of Therapy: Individual Therapy  Treatment Goals addressed: Of note for this update treatment goals focusing on stress management and anxiety but continue to work on depression, supportive interventions, trauma, grief, coping  Interventions: CBT, Solution Focused, Strength-based, Supportive, and Other: coping, relationships  Summary: Ashley Gomez is a 50 y.o. female who presents with two grand babies there one is 3 and will be 4 this year and the other one will be 2 in August. They have been there three weeks and going back the 5th of July. Son is there as well. The other kids are going to [facility], sister had a house built for her and nephew has a place stay between both of them. Patient is going to go end of month when it is her  sister and her birthday. She can get grand kids when she wants. Depends on what they are doing when she gets them. Last time March and had them a couple of weeks. She got them beginning of month supposed to stay all summer but want them to see family the fourth of July and want the kids there. [name] doing better. She sees a therapist now. She likes him. She is opening up to him. Asked how patient is doing and patient said "I'm not". Mark not happy here and says hateful things to her reminds her of when married. Expresses her opinion and drops it so not argument. Told him he has to go if he is going to treat her like that. Discussed stressors of medical issues and self-talk as helpful to frame in a realistic way. She went once to hospital but rescheduled because late and patient had to take [name] to appt. will have heart test on the first, preop and also has sinus surgery but may have to postpone has already has postponed it once. With ENT had CT scan sinus has a lot of inflammation never fully developed, always has headache he said might help a little with headache said will open it and put bone in there which got to her only takes 35-40 minutes. Had MRI had two herniated discs in back. Affected hips, legs and feet. Podiatrist thinks blood flow problem but pain in feet thing from lower back issues. Wants it fixed and not keep coming back for shots, did for shoulder and knees, last one she got was crying, thought going to die hurt so  badly, go between bone. Will decide what she wants to do get injection and see if it gives her any relief. Went back to relationship with son like dad can't stand way talk brings her down so much doesn't understand won't work on anger or talk to someone.  This noted her impression that patient is better with standing up for herself and patient does think better expressing how feel and what she wants to do. Don't bite tongue as much as did. Try not to let everything built up as  much as did. Shared Mom has not been talking to her for two years in November "which is ok". Don't stress over it anymore, used to talk to aunt everyday, don't talk and doesn't come over. Not focusing on that either she thinks too old for that.  Therapist identified as acceptance moving on patient said working on it and therapist agreed it something to work on.  Good new real Dad came home from hospital. Was in 6 months will have more interactions if he allows it. He has pneumonia twice her and sister talking to him a lot. Never had phone number before but going to give her his phone number.  There is like to catch up with not sure what she is going to want to share.  Therapist noted relationships can be positive and hopefully this will develop that way for her        Therapist reviewed symptoms, facilitated expression thoughts and feelings and reviewed treatment plan patient gave consent to complete by phone.  Noted significance of stressors particularly medical stressors.  Patient has upcoming tests that are making her anxious and therapist encourage self talk that would help her calm down such as telling herself the test is not dangerous it is helpful in finding out what is going on to give me treatment.  Validated at the same time patient dealing with different medical issues noted finding out what is going on may have a positive outcome such as sinus surgery where headaches would get better.  Talked about stress with her son living with her and noted how much she is done for him for him not to be appreciating that but rather taking his anger out on her.  Provided positive feedback for patient setting boundaries of telling him he have to go if he does not improve.  Also encourage patient with letting go of relationships that are not good for her so it does not impact her in a negative way.  Therapist provided active listening, open questions, supportive interventions Suicidal/Homicidal: No  Plan: Return  again in 6 weeks.2.  Therapist work with patient on processing feelings, help her with insight and coping for family relationships, help her with stressors significantly medical issues right now  Diagnosis: Axis I: generalized anxiety disorder PTSD, complicated grief, chronic pain syndrome, Major depressive disorder, recurrent moderate    Axis II: No diagnosis    Cordella Register, LCSW 11/10/2020

## 2020-11-12 NOTE — Telephone Encounter (Signed)
Spoke w/[facility] and she will fwd message to Dr. Arlie Solomons and his nurse to either rtn call or send fax with notes and recommendations.

## 2020-11-12 NOTE — Telephone Encounter (Signed)
Have you seen anything about this?

## 2020-11-16 NOTE — Telephone Encounter (Signed)
Turquoise from Dr. Arlie Solomons office called back she is going to verify with him about pt's medication change that he suggested and fax the note over to our office.

## 2020-11-24 MED ORDER — DIAZEPAM 5 MG PO TABS: 5.0000 mg | ORAL_TABLET | Freq: Every day | ORAL | 1 refills | Status: DC | PRN

## 2020-11-24 NOTE — Telephone Encounter (Signed)
Pt states she picked up medication and feels like she is doing well on it.

## 2020-11-24 NOTE — Telephone Encounter (Signed)
Meds ordered this encounter  Medications   diazepam (VALIUM) 5 MG tablet    Sig: Take 1 tablet (5 mg total) by mouth daily as needed for anxiety.    Dispense:  30 tablet    Refill:  1   Since changing med I only sent 30 instead of 45 of the clonazepam bc the valium lasts much longer in your system.

## 2020-12-03 ENCOUNTER — Ambulatory Visit: Payer: Medicare Other | Admitting: Sports Medicine

## 2020-12-06 DIAGNOSIS — G8929 Other chronic pain: Secondary | ICD-10-CM | POA: Diagnosis not present

## 2020-12-06 DIAGNOSIS — M545 Low back pain, unspecified: Secondary | ICD-10-CM | POA: Diagnosis not present

## 2020-12-06 DIAGNOSIS — Z79899 Other long term (current) drug therapy: Secondary | ICD-10-CM | POA: Diagnosis not present

## 2020-12-06 DIAGNOSIS — Z79891 Long term (current) use of opiate analgesic: Secondary | ICD-10-CM | POA: Diagnosis not present

## 2020-12-13 DIAGNOSIS — R9439 Abnormal result of other cardiovascular function study: Secondary | ICD-10-CM | POA: Diagnosis not present

## 2020-12-13 DIAGNOSIS — Z136 Encounter for screening for cardiovascular disorders: Secondary | ICD-10-CM | POA: Diagnosis not present

## 2020-12-13 DIAGNOSIS — I10 Essential (primary) hypertension: Secondary | ICD-10-CM | POA: Diagnosis not present

## 2020-12-13 DIAGNOSIS — R079 Chest pain, unspecified: Secondary | ICD-10-CM | POA: Diagnosis not present

## 2020-12-15 ENCOUNTER — Other Ambulatory Visit (HOSPITAL_COMMUNITY): Payer: Self-pay | Admitting: Psychiatry

## 2020-12-15 DIAGNOSIS — F331 Major depressive disorder, recurrent, moderate: Secondary | ICD-10-CM

## 2020-12-15 DIAGNOSIS — F411 Generalized anxiety disorder: Secondary | ICD-10-CM

## 2020-12-15 NOTE — Telephone Encounter (Signed)
Last VV-09/29/20 Last refill--09/16/20

## 2020-12-17 ENCOUNTER — Ambulatory Visit: Payer: Medicare Other | Admitting: Podiatry

## 2020-12-19 ENCOUNTER — Other Ambulatory Visit (HOSPITAL_COMMUNITY): Payer: Self-pay | Admitting: Psychiatry

## 2020-12-19 DIAGNOSIS — F331 Major depressive disorder, recurrent, moderate: Secondary | ICD-10-CM

## 2020-12-20 ENCOUNTER — Encounter: Payer: Self-pay | Admitting: Family Medicine

## 2020-12-20 ENCOUNTER — Ambulatory Visit (INDEPENDENT_AMBULATORY_CARE_PROVIDER_SITE_OTHER): Payer: Medicare Other | Admitting: Licensed Clinical Social Worker

## 2020-12-20 DIAGNOSIS — F4310 Post-traumatic stress disorder, unspecified: Secondary | ICD-10-CM

## 2020-12-20 DIAGNOSIS — I10 Essential (primary) hypertension: Secondary | ICD-10-CM | POA: Diagnosis not present

## 2020-12-20 DIAGNOSIS — G894 Chronic pain syndrome: Secondary | ICD-10-CM

## 2020-12-20 DIAGNOSIS — F4321 Adjustment disorder with depressed mood: Secondary | ICD-10-CM

## 2020-12-20 DIAGNOSIS — J320 Chronic maxillary sinusitis: Secondary | ICD-10-CM | POA: Diagnosis not present

## 2020-12-20 DIAGNOSIS — F411 Generalized anxiety disorder: Secondary | ICD-10-CM

## 2020-12-20 DIAGNOSIS — F331 Major depressive disorder, recurrent, moderate: Secondary | ICD-10-CM

## 2020-12-20 DIAGNOSIS — R9439 Abnormal result of other cardiovascular function study: Secondary | ICD-10-CM | POA: Diagnosis not present

## 2020-12-20 NOTE — Progress Notes (Signed)
Virtual Visit via Telephone Note  I connected with ANGELEEN PUTMAN on 12/20/20 at  1:00 PM EDT by telephone and verified that I am speaking with the correct person using two identifiers.  Location: Patient: home Provider: home office   I discussed the limitations, risks, security and privacy concerns of performing an evaluation and management service by telephone and the availability of in person appointments. I also discussed with the patient that there may be a patient responsible charge related to this service. The patient expressed understanding and agreed to proceed.   I discussed the assessment and treatment plan with the patient. The patient was provided an opportunity to ask questions and all were answered. The patient agreed with the plan and demonstrated an understanding of the instructions.   The patient was advised to call back or seek an in-person evaluation if the symptoms worsen or if the condition fails to improve as anticipated.  I provided 45 minutes of non-face-to-face time during this encounter.  THERAPIST PROGRESS NOTE  Session Time: 1:00 PM to 1:45 PM  Participation Level: Active  Behavioral Response: CasualAlertAnxious  Type of Therapy: Individual Therapy  Treatment Goals addressed:  stress management and anxiety but continue to work on depression, supportive interventions, trauma, grief, coping  Interventions: Solution Focused, Strength-based, Supportive, and Other: coping  Summary: ANJA DESAUTEL is a 50 y.o. female who presents with have a surgery this Friday. They found out one sinus didn't develop, where her allergy problems and breathing problems come from. Not looking forward to it. Talking out a piece of bone, sinus can't drain can't do anything. There is inflammation that doctor is going to take it out. Because on her face and also doesn't want to pack nose heard that really hurts. Doesn't want to be in more pain than is. Son still there and changing a  little bit so not as bad. Since kids gone, they left July 4, not as bad with her. Went to sister's a week before birthday to [facility], it was good and peaceful. Donnie Aho is doing better has a therapist that patient is pleased about. Has three herniated discs supposedly where a lot of blood flow and swelling comes into her feet.  [facility] can't [facility] it right now. They will figure something out. Dr. Darene Lamer wants her to get shots in back don't know if will work or how long it will work will it fix pain in feet, legs and back and frustrated just wants it to be fixed. School starts 8/29 trying to get all that together. [name] middle school she goes to have a dress code. Want them to get certain things and the list is long. Spending almost as much as on clothes. Trying to take care of herself and [name] and the rest of them. "It is crazy". Doesn't know when baby [name] and Beverly Milch ([name] in fourth) coming back. Has to get through the surgery thinks it is getting to the point where she(other grandmother) can't handle it. All the kids are all playing ball this year. Trying to get them ready for school, get herself better, get to appointments and time to relax a little. Talked about what she is doing to relax go through things in house and decide what she wants to get rid of, watching television.       Therapist reviewed symptoms, facilitated expression of thoughts and feelings processing feelings as treatment intervention to help with managing stressors, helping her purge to release emotions.  Validated patient on some of the stressors she is experiencing such as stress around getting kids ready for school, also taking care of her own issues, and finding some time to relax.  Noted some small improvements that have been helpful highlighted this to focus on some positives that [name] is a little better, injury on a getting help now from therapy is a positive.  Noted patient's own  strengths resources and resiliency.  Worked on strategies for coping with stressors.  Therapist provided active listening, open questions, supportive interventions. Suicidal/Homicidal: No  Plan: Return again in 4 weeks.2.Therapist work with patient on processing feelings, help her with insight and coping for family relationships, help her with stressors significantly medical issues right now  Diagnosis: Axis I: generalized anxiety disorder PTSD, complicated grief, chronic pain syndrome, Major depressive disorder, recurrent moderate    Axis II: No diagnosis    Cordella Register, LCSW 12/20/2020

## 2020-12-21 ENCOUNTER — Ambulatory Visit (HOSPITAL_COMMUNITY): Payer: Medicare Other | Admitting: Psychiatry

## 2020-12-24 DIAGNOSIS — K760 Fatty (change of) liver, not elsewhere classified: Secondary | ICD-10-CM | POA: Diagnosis not present

## 2020-12-24 DIAGNOSIS — D735 Infarction of spleen: Secondary | ICD-10-CM | POA: Diagnosis not present

## 2020-12-24 DIAGNOSIS — D3502 Benign neoplasm of left adrenal gland: Secondary | ICD-10-CM | POA: Diagnosis not present

## 2020-12-24 DIAGNOSIS — Z86718 Personal history of other venous thrombosis and embolism: Secondary | ICD-10-CM | POA: Diagnosis not present

## 2020-12-24 DIAGNOSIS — R569 Unspecified convulsions: Secondary | ICD-10-CM | POA: Diagnosis not present

## 2020-12-24 DIAGNOSIS — M2602 Maxillary hypoplasia: Secondary | ICD-10-CM | POA: Diagnosis not present

## 2020-12-24 DIAGNOSIS — Z8673 Personal history of transient ischemic attack (TIA), and cerebral infarction without residual deficits: Secondary | ICD-10-CM | POA: Diagnosis not present

## 2020-12-24 DIAGNOSIS — Z87442 Personal history of urinary calculi: Secondary | ICD-10-CM | POA: Diagnosis not present

## 2020-12-24 DIAGNOSIS — I10 Essential (primary) hypertension: Secondary | ICD-10-CM | POA: Diagnosis not present

## 2020-12-24 DIAGNOSIS — Z720 Tobacco use: Secondary | ICD-10-CM | POA: Diagnosis not present

## 2020-12-24 DIAGNOSIS — E785 Hyperlipidemia, unspecified: Secondary | ICD-10-CM | POA: Diagnosis not present

## 2020-12-24 DIAGNOSIS — Z888 Allergy status to other drugs, medicaments and biological substances status: Secondary | ICD-10-CM | POA: Diagnosis not present

## 2020-12-24 DIAGNOSIS — M5136 Other intervertebral disc degeneration, lumbar region: Secondary | ICD-10-CM | POA: Diagnosis not present

## 2020-12-24 DIAGNOSIS — M170 Bilateral primary osteoarthritis of knee: Secondary | ICD-10-CM | POA: Diagnosis not present

## 2020-12-24 DIAGNOSIS — F17200 Nicotine dependence, unspecified, uncomplicated: Secondary | ICD-10-CM | POA: Diagnosis not present

## 2020-12-24 DIAGNOSIS — J320 Chronic maxillary sinusitis: Secondary | ICD-10-CM | POA: Insufficient documentation

## 2020-12-24 DIAGNOSIS — M797 Fibromyalgia: Secondary | ICD-10-CM | POA: Diagnosis not present

## 2020-12-24 DIAGNOSIS — Z808 Family history of malignant neoplasm of other organs or systems: Secondary | ICD-10-CM | POA: Diagnosis not present

## 2020-12-24 DIAGNOSIS — Z96652 Presence of left artificial knee joint: Secondary | ICD-10-CM | POA: Diagnosis not present

## 2020-12-24 DIAGNOSIS — M069 Rheumatoid arthritis, unspecified: Secondary | ICD-10-CM | POA: Diagnosis not present

## 2020-12-24 DIAGNOSIS — K219 Gastro-esophageal reflux disease without esophagitis: Secondary | ICD-10-CM | POA: Diagnosis not present

## 2020-12-24 DIAGNOSIS — M1612 Unilateral primary osteoarthritis, left hip: Secondary | ICD-10-CM | POA: Diagnosis not present

## 2020-12-28 ENCOUNTER — Telehealth (INDEPENDENT_AMBULATORY_CARE_PROVIDER_SITE_OTHER): Payer: Medicare Other | Admitting: Psychiatry

## 2020-12-28 ENCOUNTER — Encounter (HOSPITAL_COMMUNITY): Payer: Self-pay | Admitting: Psychiatry

## 2020-12-28 DIAGNOSIS — F331 Major depressive disorder, recurrent, moderate: Secondary | ICD-10-CM | POA: Diagnosis not present

## 2020-12-28 DIAGNOSIS — F411 Generalized anxiety disorder: Secondary | ICD-10-CM

## 2020-12-28 MED ORDER — DULOXETINE HCL 60 MG PO CPEP: 60.0000 mg | ORAL_CAPSULE | Freq: Every day | ORAL | 0 refills | Status: DC
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
## 2020-12-30 ENCOUNTER — Encounter: Payer: Self-pay | Admitting: Family Medicine

## 2020-12-31 ENCOUNTER — Encounter: Payer: Self-pay | Admitting: Podiatry

## 2020-12-31 ENCOUNTER — Other Ambulatory Visit: Payer: Self-pay

## 2020-12-31 ENCOUNTER — Ambulatory Visit (INDEPENDENT_AMBULATORY_CARE_PROVIDER_SITE_OTHER): Payer: Medicare Other | Admitting: Podiatry

## 2020-12-31 DIAGNOSIS — Z86718 Personal history of other venous thrombosis and embolism: Secondary | ICD-10-CM | POA: Diagnosis not present

## 2020-12-31 DIAGNOSIS — I7300 Raynaud's syndrome without gangrene: Secondary | ICD-10-CM | POA: Diagnosis not present

## 2020-12-31 DIAGNOSIS — G629 Polyneuropathy, unspecified: Secondary | ICD-10-CM | POA: Diagnosis not present

## 2020-12-31 DIAGNOSIS — R7303 Prediabetes: Secondary | ICD-10-CM | POA: Diagnosis not present

## 2020-12-31 DIAGNOSIS — E785 Hyperlipidemia, unspecified: Secondary | ICD-10-CM | POA: Diagnosis not present

## 2020-12-31 DIAGNOSIS — I10 Essential (primary) hypertension: Secondary | ICD-10-CM | POA: Diagnosis not present

## 2020-12-31 NOTE — Progress Notes (Signed)
Subjective: 50 year old female presents the office today for follow evaluation of bilateral foot discomfort.  She still gets burning to both of her feet all the time.  Also concern about purple discoloration to her toes and a cold sensation to her digits.  I previously had try to get her compound cream to help with the circulation but she was unable to get this due to cost.  Also she cannot use nitro patches.  She did follow-up with her her back given herniated disks in her low back in injections were apparently recommended but she has not yet had this performed.  She gets cramping to her third toe on the right side at times.  No recent injury or changes otherwise.  Objective: AAO x3, NAD DP/PT pulses palpable bilaterally, CRT less than 3 seconds Describing diffuse burning, tingling to her feet. There is a purple discoloration present all the digits in the forefoot.  There is no erythema or warmth.  No skin breakdown, no evidence of gangrene. Toes in rectus position.  There is no area of pinpoint tenderness identified today. no open lesions or pre-ulcerative lesions.  No pain with calf compression, swelling, warmth, erythema  Assessment: Neuropathy, likely Raynaud's   Plan: -All treatment options discussed with the patient including all alternatives, risks, complications.  -Endorses discoloration I think she has Raynauds.  She does not report that the topical.  Will discuss with her primary care physician about starting a calcium channel blocker to see if this will be of benefit.  Also recommended her to follow-up with her back doctor for injections to see if she gets any improvement from this. -Patient encouraged to call the office with any questions, concerns, change in symptoms.   Trula Slade DPM

## 2020-12-31 NOTE — Patient Instructions (Signed)
I have ordered a medication for you that will come from [facility] in [place]. They should be calling you to verify insurance and will mail the medication to you. If you live close by then you can go by their pharmacy to pick up the medication. Their phone number is [phone]. If you do not hear from them in the next few days, please give us a call at [phone].   

## 2021-01-01 NOTE — Telephone Encounter (Signed)
Call pt: please ler her know Dr. Jacqualyn Posey reached out to me and recommend we try a Calcium channel blocker for your raynauds.  Ar you ok with me sending in a mediciton for you?

## 2021-01-02 ENCOUNTER — Other Ambulatory Visit (HOSPITAL_COMMUNITY): Payer: Self-pay | Admitting: Psychiatry

## 2021-01-03 NOTE — Telephone Encounter (Signed)
Spoke with pt who states that she okay with trying one of these medications as long as you think that it will help with this condition and will not interfere with her other medications.  Charyl Bigger, CMA

## 2021-01-03 NOTE — Telephone Encounter (Signed)
Agree with documentation as above.   Renny Remer, MD  

## 2021-01-05 ENCOUNTER — Encounter: Payer: Self-pay | Admitting: Family Medicine

## 2021-01-05 DIAGNOSIS — Z9889 Other specified postprocedural states: Secondary | ICD-10-CM | POA: Diagnosis not present

## 2021-01-05 MED ORDER — AMLODIPINE BESYLATE 2.5 MG PO TABS: 2.5000 mg | ORAL_TABLET | Freq: Every day | ORAL | 3 refills | Status: DC

## 2021-01-05 NOTE — Telephone Encounter (Signed)
Meds ordered this encounter  Medications   amLODipine (NORVASC) 2.5 MG tablet    Sig: Take 1 tablet (2.5 mg total) by mouth daily.    Dispense:  30 tablet    Refill:  3

## 2021-01-06 DIAGNOSIS — Z79891 Long term (current) use of opiate analgesic: Secondary | ICD-10-CM | POA: Diagnosis not present

## 2021-01-06 DIAGNOSIS — G8929 Other chronic pain: Secondary | ICD-10-CM | POA: Diagnosis not present

## 2021-01-06 DIAGNOSIS — M545 Low back pain, unspecified: Secondary | ICD-10-CM | POA: Diagnosis not present

## 2021-01-06 DIAGNOSIS — Z79899 Other long term (current) drug therapy: Secondary | ICD-10-CM | POA: Diagnosis not present

## 2021-01-10 ENCOUNTER — Ambulatory Visit (HOSPITAL_COMMUNITY): Payer: Medicare Other | Admitting: Licensed Clinical Social Worker

## 2021-01-10 DIAGNOSIS — Z79899 Other long term (current) drug therapy: Secondary | ICD-10-CM | POA: Diagnosis not present

## 2021-01-11 ENCOUNTER — Ambulatory Visit (INDEPENDENT_AMBULATORY_CARE_PROVIDER_SITE_OTHER): Payer: Medicare Other | Admitting: Sports Medicine

## 2021-01-11 ENCOUNTER — Other Ambulatory Visit: Payer: Self-pay

## 2021-01-11 DIAGNOSIS — M47816 Spondylosis without myelopathy or radiculopathy, lumbar region: Secondary | ICD-10-CM

## 2021-01-11 NOTE — Progress Notes (Signed)
    Procedures performed today:    None.  Independent interpretation of notes and tests performed by another provider:   MRI personally reviewed, there is bilateral L4-S1 facet joint arthritis, discs are desiccated but overall look good in terms of no areas of neuroforaminal stenosis.  Brief History, Exam, Impression, and Recommendations:    Lumbar spondylosis Ashley Gomez, she is a 50 year old female with chronic low back pain, we did obtain a recent MRI, discs look pretty good, no areas of neuroforaminal stenosis, she does have bilateral L4-S1 facet joint arthritis fairly severe. Proceeding with bilateral L4 S1 facet joint injections, I did explain that she should expect relief of back pain but not leg pain. Return to see me 1 month after the injection.    ___________________________________________ Gwen Her. Dianah Field, M.D., ABFM., CAQSM. Primary Care and [place] Instructor of [place] [employer] [facility] [employer]

## 2021-01-11 NOTE — Assessment & Plan Note (Signed)
Ashley Gomez returns, she is a 50 year old female with chronic low back pain, we did obtain a recent MRI, discs look pretty good, no areas of neuroforaminal stenosis, she does have bilateral L4-S1 facet joint arthritis fairly severe. Proceeding with bilateral L4 S1 facet joint injections, I did explain that she should expect relief of back pain but not leg pain. Return to see me 1 month after the injection.

## 2021-01-15 ENCOUNTER — Other Ambulatory Visit (HOSPITAL_COMMUNITY): Payer: Self-pay | Admitting: Psychiatry

## 2021-01-15 DIAGNOSIS — F331 Major depressive disorder, recurrent, moderate: Secondary | ICD-10-CM

## 2021-01-15 DIAGNOSIS — F411 Generalized anxiety disorder: Secondary | ICD-10-CM

## 2021-01-18 ENCOUNTER — Ambulatory Visit (INDEPENDENT_AMBULATORY_CARE_PROVIDER_SITE_OTHER): Payer: Medicare Other | Admitting: Licensed Clinical Social Worker

## 2021-01-18 DIAGNOSIS — F4321 Adjustment disorder with depressed mood: Secondary | ICD-10-CM

## 2021-01-18 DIAGNOSIS — F411 Generalized anxiety disorder: Secondary | ICD-10-CM | POA: Diagnosis not present

## 2021-01-18 DIAGNOSIS — F4310 Post-traumatic stress disorder, unspecified: Secondary | ICD-10-CM

## 2021-01-18 DIAGNOSIS — G894 Chronic pain syndrome: Secondary | ICD-10-CM

## 2021-01-18 DIAGNOSIS — F331 Major depressive disorder, recurrent, moderate: Secondary | ICD-10-CM

## 2021-01-18 NOTE — Progress Notes (Signed)
THERAPIST PROGRESS NOTE  Session Time: 2:00 PM to 2:53 PM  Participation Level: Active  Behavioral Response: CasualAlertDysphoric  Type of Therapy: Individual Therapy  Treatment Goals addressed:  stress management and anxiety but continue to work on depression, supportive interventions, trauma, grief, coping  Interventions: Solution Focused, Strength-based, Supportive, and Other: grief, coping  Summary: Ashley Gomez is a 50 y.o. female who presents with 8 years since lost Ashley Gomez tonight at about 3 in the morning. . Doesn't seem that long ago like 8 days. Ashley Gomez passed away stroke on the Jan 13, 2023 and he passed the 17th. Buried him the 20th. Mom had cookout, birthday mourning. Couldn't stay couldn't be around the somberness. Around people she hadn't seen didn't want to be there. Explored how she feels. Not sure if it hit her. Had episodes went to hospital thought having a heart attack, had one Sunday didn't feel right. Leaving to go to [employer] felt worse. Came back episode and hurts. broke heart syndrome sees as a real thing. Knows her psych issues are expressed in her body. When happens it hurts concerned with how it hurts then what causes it. Lay in bed close her eyes and breath through it. Cousin has bladder cancer.Has to get four shots on Thursday 3 vertebrae not worth nothing. Hopes helpful otherwise not moving. Scared. Deadened the nerves so don't have pain. Progressively gotten worse over the last 3-5 years. Not near active as when working. Getting to the point of not caring not hold back and hold in.  Therapist states this is positive patient. Still have stomach pain, has seen doctor for over a year about her stomach. Thinks something wrong with stomach or organ near stomach. Likes cardiologist, likes [employer]. [name] in therapy and likes going. Her therapist says they can see she is drained when brings up her mom. Granddaughter is holding in and a lot of  anger. Pointed out how patient been to her family [place] she spent in helping him through things. She wasn't going to be able to [place] and baby [name] but grandmother realizes shouldn't be separated so can see when she wants.  Noted in the past patient has had times where did not want to be here but now she wants to know what is wrong so she can be there for her family in terms of medical issues.    Therapist reviewed symptoms, facilitated expression of thoughts and feelings utilized this as important treatment intervention for patient to process through current stressors related to medical issues, managing Gomez of the death of her daughter.  Noted physical issues and trying to get to the source, frustration with how long it is taking to figure out what is going on and still do not know.  Therapist noted important statement patient said in session she wants to find out what is going on so she can be here for family.  Therapist utilized strength based and noting patient being very important to family and positive she knows that.  Processed feelings related to loss of ex-boyfriend noting significant says she called physical symptoms she believes heartbreak syndrome.  Validated patient on having gone through many stressors since she has been in therapy.  Explored her feelings related to loss as grief intervention to help her work through feelings.  Therapist provided active listening open questions, supportive interventions  Suicidal/Homicidal: No  Plan: Return again in 5 weeks.2.  Processed feelings related to stressors, work on effective coping skills  Diagnosis: Axis  I: generalized anxiety disorder PTSD, complicated grief, chronic pain syndrome, Major depressive disorder, recurrent moderate    Axis II: No diagnosis    Cordella Register, LCSW 01/18/2021

## 2021-01-18 NOTE — Plan of Care (Signed)
Patient is progresing well

## 2021-01-19 DIAGNOSIS — Z9889 Other specified postprocedural states: Secondary | ICD-10-CM | POA: Diagnosis not present

## 2021-01-20 ENCOUNTER — Other Ambulatory Visit: Payer: Self-pay | Admitting: Sports Medicine

## 2021-01-20 ENCOUNTER — Ambulatory Visit
Admission: RE | Admit: 2021-01-20 | Discharge: 2021-01-20 | Disposition: A | Discharge status: Home / Self Care | Payer: Medicare Other | Source: Ambulatory Visit | Attending: Sports Medicine | Admitting: Sports Medicine

## 2021-01-20 DIAGNOSIS — M47816 Spondylosis without myelopathy or radiculopathy, lumbar region: Secondary | ICD-10-CM

## 2021-01-20 DIAGNOSIS — M47817 Spondylosis without myelopathy or radiculopathy, lumbosacral region: Secondary | ICD-10-CM | POA: Diagnosis not present

## 2021-01-20 MED ORDER — METHYLPREDNISOLONE ACETATE 40 MG/ML INJ SUSP (RADIOLOG
120.0000 mg | Freq: Once | INTRAMUSCULAR | Status: AC
  Administered 2021-01-20: 120 mg via INTRA_ARTICULAR

## 2021-01-20 MED ORDER — IOPAMIDOL (ISOVUE-M 200) INJECTION 41%
1.0000 mL | Freq: Once | INTRAMUSCULAR | Status: AC
  Administered 2021-01-20: 1 mL via INTRA_ARTICULAR

## 2021-01-20 NOTE — Discharge Instructions (Signed)

## 2021-01-22 ENCOUNTER — Other Ambulatory Visit: Payer: Self-pay | Admitting: Family Medicine

## 2021-01-22 DIAGNOSIS — G43019 Migraine without aura, intractable, without status migrainosus: Secondary | ICD-10-CM

## 2021-01-24 ENCOUNTER — Other Ambulatory Visit: Payer: Self-pay | Admitting: Family Medicine

## 2021-01-25 ENCOUNTER — Encounter: Payer: Self-pay | Admitting: Family Medicine

## 2021-01-25 MED ORDER — DIAZEPAM 5 MG PO TABS: 5.0000 mg | ORAL_TABLET | Freq: Every day | ORAL | 1 refills | Status: DC | PRN

## 2021-01-25 NOTE — Telephone Encounter (Signed)
Meds ordered this encounter  Medications   diazepam (VALIUM) 5 MG tablet    Sig: Take 1 tablet (5 mg total) by mouth daily as needed for anxiety.    Dispense:  30 tablet    Refill:  1

## 2021-01-28 NOTE — Telephone Encounter (Signed)
I cannot explain the details of medial branch blocks and radiofrequency ablation over an email, lets get her set up for at least a telephone visit or a video visit.

## 2021-01-28 NOTE — Telephone Encounter (Signed)
Spoke with aptient to make her aware of Dr. Darene Lamer recommendation. She was transferred tot he front desk to schedule appt.

## 2021-02-04 ENCOUNTER — Telehealth (INDEPENDENT_AMBULATORY_CARE_PROVIDER_SITE_OTHER): Payer: Medicare Other | Admitting: Sports Medicine

## 2021-02-04 ENCOUNTER — Other Ambulatory Visit: Payer: Self-pay | Admitting: Sports Medicine

## 2021-02-04 ENCOUNTER — Encounter: Payer: Self-pay | Admitting: Family Medicine

## 2021-02-04 DIAGNOSIS — Z79899 Other long term (current) drug therapy: Secondary | ICD-10-CM | POA: Diagnosis not present

## 2021-02-04 DIAGNOSIS — G8929 Other chronic pain: Secondary | ICD-10-CM | POA: Diagnosis not present

## 2021-02-04 DIAGNOSIS — M47816 Spondylosis without myelopathy or radiculopathy, lumbar region: Secondary | ICD-10-CM | POA: Diagnosis not present

## 2021-02-04 DIAGNOSIS — M545 Low back pain, unspecified: Secondary | ICD-10-CM | POA: Diagnosis not present

## 2021-02-04 DIAGNOSIS — Z79891 Long term (current) use of opiate analgesic: Secondary | ICD-10-CM | POA: Diagnosis not present

## 2021-02-04 NOTE — Progress Notes (Signed)
Discuss treatment options.

## 2021-02-04 NOTE — Progress Notes (Signed)
   Virtual Visit via Telephone   I connected with  QUINTARA BOST  on 02/04/21 by telephone/telehealth and verified that I am speaking with the correct person using two identifiers.   I discussed the limitations, risks, security and privacy concerns of performing an evaluation and management service by telephone, including the higher likelihood of inaccurate diagnosis and treatment, and the availability of in person appointments.  We also discussed the likely need of an additional face to face encounter for complete and high quality delivery of care.  I also discussed with the patient that there may be a patient responsible charge related to this service. The patient expressed understanding and wishes to proceed.  Provider location is in medical facility. Patient location is at their home, different from provider location. People involved in care of the patient during this telehealth encounter were myself, my nurse/medical assistant, and my front office/scheduling team member.  Review of Systems: No fevers, chills, night sweats, weight loss, chest pain, or shortness of breath.   Objective Findings:    General: Speaking full sentences, no audible heavy breathing.  Sounds alert and appropriately interactive.    Independent interpretation of tests performed by another provider:   None.  Brief History, Exam, Impression, and Recommendations:    Lumbar spondylosis This is a 50 year old female, chronic low back pain, she does have bilateral L4 S1 facet arthritis, she did really well with bilateral L4-S1 facet joint injections, we are to proceed with medial branch blocks and if successful, radiofrequency ablation. Return to see me 1 month after radiofrequency ablation. She understands if this fails then she will just need to continue with medical pain management with her pain provider.   I discussed the above assessment and treatment plan with the patient. The patient was provided an  opportunity to ask questions and all were answered. The patient agreed with the plan and demonstrated an understanding of the instructions.   The patient was advised to call back or seek an in-person evaluation if the symptoms worsen or if the condition fails to improve as anticipated.   I provided 30 minutes of verbal and non-verbal time during this encounter date, time was needed to gather information, review chart, records, communicate/coordinate with staff remotely, as well as complete documentation.  We had extensive discussion about the anatomy, as well as treatment approach for facet mediated pain.   ___________________________________________ Gwen Her. Dianah Field, M.D., ABFM., CAQSM. Primary Care and Sports Medicine [facility]  Adjunct Professor of [place] [employer] [facility] of Medicine

## 2021-02-04 NOTE — Assessment & Plan Note (Signed)
This is a 50 year old female, chronic low back pain, she does have bilateral L4 S1 facet arthritis, she did really well with bilateral L4-S1 facet joint injections, we are to proceed with medial branch blocks and if successful, radiofrequency ablation. Return to see me 1 month after radiofrequency ablation. She understands if this fails then she will just need to continue with medical pain management with her pain provider.

## 2021-02-08 ENCOUNTER — Encounter: Payer: Self-pay | Admitting: Family Medicine

## 2021-02-08 ENCOUNTER — Ambulatory Visit: Payer: Medicare Other | Admitting: Sports Medicine

## 2021-02-08 ENCOUNTER — Ambulatory Visit (INDEPENDENT_AMBULATORY_CARE_PROVIDER_SITE_OTHER): Payer: Medicare Other | Admitting: Family Medicine

## 2021-02-08 ENCOUNTER — Other Ambulatory Visit: Payer: Self-pay | Admitting: Family Medicine

## 2021-02-08 VITALS — BP 138/76 | HR 88 | Ht 66.0 in | Wt 285.0 lb

## 2021-02-08 DIAGNOSIS — R1084 Generalized abdominal pain: Secondary | ICD-10-CM

## 2021-02-08 DIAGNOSIS — Z23 Encounter for immunization: Secondary | ICD-10-CM

## 2021-02-08 DIAGNOSIS — I7300 Raynaud's syndrome without gangrene: Secondary | ICD-10-CM | POA: Insufficient documentation

## 2021-02-08 DIAGNOSIS — R7301 Impaired fasting glucose: Secondary | ICD-10-CM

## 2021-02-08 DIAGNOSIS — R1032 Left lower quadrant pain: Secondary | ICD-10-CM | POA: Diagnosis not present

## 2021-02-08 DIAGNOSIS — G43019 Migraine without aura, intractable, without status migrainosus: Secondary | ICD-10-CM

## 2021-02-08 DIAGNOSIS — I10 Essential (primary) hypertension: Secondary | ICD-10-CM

## 2021-02-08 DIAGNOSIS — R630 Anorexia: Secondary | ICD-10-CM

## 2021-02-08 LAB — POCT GLYCOSYLATED HEMOGLOBIN (HGB A1C): Hemoglobin A1C: 6.2 % — AB (ref 4.0–5.6)

## 2021-02-08 MED ORDER — ONDANSETRON HCL 4 MG PO TABS: ORAL_TABLET | ORAL | 1 refills | Status: DC

## 2021-02-08 MED ORDER — QULIPTA 10 MG PO TABS: 10.0000 mg | ORAL_TABLET | Freq: Every day | ORAL | 0 refills | Status: DC

## 2021-02-08 NOTE — Assessment & Plan Note (Signed)
Well controlled. Continue current regimen. Follow up in  6 mo  

## 2021-02-08 NOTE — Progress Notes (Signed)
Established Patient Office Visit  Subjective:  Patient ID: Ashley Gomez, female    DOB: 20-Feb-1971  Age: 50 y.o. MRN: [id number]  CC:  Chief Complaint  Patient presents with   ifg   Migraine    HPI CARLOTA PHILLEY presents for   Hypertension- Pt denies chest pain, SOB, dizziness, or heart palpitations.  Taking meds as directed w/o problems.  Denies medication side effects.    Impaired fasting glucose-no increased thirst or urination. No symptoms consistent with hypoglycemia.  We did recently place a new referral to [facility] pain management.  She has been a patient there for over 2 years but they needed an updated yearly referral.  He also recently switched her clonazepam to Valium to get more potential muscle relaxing relief.  She also recently saw Dr. Earleen Newport, podiatry who felt like she likely had Raynaud's syndrome.  So we decided to do a low-dose trial of amlodipine 2.5 mg daily to see if this was helpful with symptom control.  She is not sure if it is helping she thought maybe it was but then over the weekend she started getting more burning and sensitivity in her feet even just putting the blanket on it at night is painful.  Though she is having some more persistent back problems and is getting scheduled for medial blocks in her back.  Also c/o fo abdomen pain tha thas been on and off for years.  She last saw Dr. Erlene Quan, gastroenterology in March of this year for abdominal pain.  He had also been seen in 2019 by Lilyan Gilford for abdominal pain and given dicyclomine 20 mg and encouraged to take fiber.  When she was last seen by GI she was also have some some rectal bleeding they had recommended an EGD and screening colonoscopy.  She did have the procedure done in March.  She was just noted to have some mild reactive changes in the stomach she was negative for H. pylori.  No colon abnormalities.  F/U migraines - she feels like the Nurtec does help decrease the intensity of her  headaches but it does not make it go completely away and she feels like 8/month is not enough to control her symptoms.  Past Medical History:  Diagnosis Date   Anxiety    Arthritis    CVA (cerebral infarction)    Old CVA on MRI brain   Depression    DVT (deep venous thrombosis) ([place]) 19/16/6060   L basilic vein    Facet hypertrophy of lumbar region    MRI 2007   Fibromyalgia    GERD (gastroesophageal reflux disease)    Hypertension    Migraines    MS (multiple sclerosis) (HCC)    Neuromuscular disorder (HCC)    Obesity    Pre-diabetes    PTSD (post-traumatic stress disorder)    Seizures ([place]) 2011   no seizure since onset   Smoking    Stroke [facility])    TIA, >8years ago    Past Surgical History:  Procedure Laterality Date   CHOLECYSTECTOMY  10/2017   CHONDROPLASTY Left 12/30/2014   Procedure: CHONDROPLASTY;  Surgeon: Leandrew Koyanagi, MD;  Location: [place];  Service: Orthopedics;  Laterality: Left;   KNEE ARTHROSCOPY Left 12/30/2014   Procedure: LEFT KNEE ARTHROSCOPY WITH   CHONDROPLASTY;  Surgeon: Leandrew Koyanagi, MD;  Location: [place];  Service: Orthopedics;  Laterality: Left;   PARTIAL KNEE ARTHROPLASTY Left 08/03/2016   Procedure: LEFT UNICOMPARTMENTAL  KNEE ARTHROPLASTY;  Surgeon: Leandrew Koyanagi, MD;  Location: [place];  Service: Orthopedics;  Laterality: Left;   SHOULDER ARTHROSCOPY WITH BICEPSTENOTOMY Left 01/15/2020   Procedure: SHOULDER ARTHROSCOPY WITH BICEPSTENOTOMY;  Surgeon: Hiram Gash, MD;  Location: [place];  Service: Orthopedics;  Laterality: Left;   SHOULDER ARTHROSCOPY WITH DISTAL CLAVICLE RESECTION Left 01/15/2020   Procedure: SHOULDER ARTHROSCOPY WITH DISTAL CLAVICULECTOMY;  Surgeon: Hiram Gash, MD;  Location: [place];  Service: Orthopedics;  Laterality: Left;   SHOULDER ARTHROSCOPY WITH ROTATOR CUFF REPAIR AND SUBACROMIAL DECOMPRESSION Left 01/15/2020   Procedure: SHOULDER ARTHROSCOPY DEBRIDMENT  WITH ROTATOR CUFF REPAIR, SUBACROMIAL DECOMPRESSION WITH ACROMIPLASTY, AND BICEP TENOTOMY;  Surgeon: Hiram Gash, MD;  Location: [place];  Service: Orthopedics;  Laterality: Left;   TONSILLECTOMY     TUBAL LIGATION  1992    Family History  Problem Relation Age of Onset   Cancer Mother 19       melanoma   Hypertension Sister    Heart disease Sister        valve disease   Depression Sister    Diabetes Sister    Sjogren's syndrome Sister     Social History   Socioeconomic History   Marital status: Single    Spouse name: Not on file   Number of children: Not on file   Years of education: Not on file   Highest education level: Not on file  Occupational History   Not on file  Tobacco Use   Smoking status: Every Day    Packs/day: 1.00    Years: 25.00    Pack years: 25.00    Types: Cigarettes   Smokeless tobacco: Never   Tobacco comments:    advised to d/c by 3 cigs per week.  Substance and Sexual Activity   Alcohol use: No    Alcohol/week: 0.0 standard drinks   Drug use: No   Sexual activity: Yes    Birth control/protection: None, Post-menopausal  Other Topics Concern   Not on file  Social History Narrative   Not on file   Social Determinants of Health   Financial Resource Strain: Not on file  Food Insecurity: Not on file  Transportation Needs: Not on file  Physical Activity: Not on file  Stress: Not on file  Social Connections: Not on file  Intimate Partner Violence: Not on file    Outpatient Medications Prior to Visit  Medication Sig Dispense Refill   amLODipine (NORVASC) 2.5 MG tablet Take 1 tablet (2.5 mg total) by mouth daily. 30 tablet 3   ammonium lactate (LAC-HYDRIN) 12 % lotion See admin instructions.     atorvastatin (LIPITOR) 20 MG tablet Take by mouth.     azelastine (ASTELIN) 0.1 % nasal spray 2 sprays 2 (two) times daily.     BD PEN NEEDLE NANO 2ND GEN 32G X 4 MM MISC      busPIRone (BUSPAR) 7.5 MG tablet TAKE 1 TABLET BY  MOUTH TWICE A DAY AS NEEDED 180 tablet 0   chlorthalidone (HYGROTON) 25 MG tablet Take by mouth.     diazepam (VALIUM) 5 MG tablet Take 1 tablet (5 mg total) by mouth daily as needed for anxiety. 30 tablet 1   DULoxetine (CYMBALTA) 30 MG capsule TAKE 1 CAPSULE BY MOUTH DAILY ALONG WITH 60MG CAPSULE 90 capsule 0   DULoxetine (CYMBALTA) 60 MG capsule Take by mouth.     famotidine (PEPCID) 20 MG tablet TAKE 1 TABLET BY MOUTH  EVERY DAY 90 tablet 3   HYDROcodone-acetaminophen (NORCO) 10-325 MG tablet Take 1 tablet by mouth every 6 (six) hours as needed for pain. for pain  0   levocetirizine (XYZAL) 5 MG tablet Take 5 mg by mouth daily.     metoprolol succinate (TOPROL-XL) 25 MG 24 hr tablet TAKE 1 TABLET BY MOUTH EVERY DAY 90 tablet 1   NARCAN 4 MG/0.1ML LIQD nasal spray kit USE AS DIRECTED     NON FORMULARY [facility]  Circulation-#5     omeprazole (PRILOSEC) 40 MG capsule TAKE 1 CAPSULE BY MOUTH EVERY DAY 90 capsule 3   VICTOZA 18 MG/3ML SOPN Inject 1.8 mg into the skin daily.  1   Galcanezumab-gnlm (EMGALITY) 120 MG/ML SOAJ INJECT 240MG (2 ML) UNDER THE SKIN FOR 1 DOSE , THEN INJECT 120MG (1 ML) UNDER THE SKIN EVERY MONTH. 2 mL 3   ondansetron (ZOFRAN) 4 MG tablet TAKE 1 TABLET EVERY 8 HOURS AS NEEDED FOR NAUSEA AND VOMITING 10 tablet 1   NURTEC 75 MG TBDP TAKE 1 TABLET BY MOUTH EVERY DAY AS NEEDED 8 tablet 1   No facility-administered medications prior to visit.    Allergies  Allergen Reactions   Ace Inhibitors     REACTION: cough   Atenolol Other (See Comments)    Bradycardia   Celebrex [Celecoxib] Other (See Comments)    Swelling in extremities   Celexa [Citalopram Hydrobromide] Other (See Comments)    Dizziness Tolerated Lexapro    Codeine Nausea Only   Methocarbamol Nausea Only   Nitroglycerin Other (See Comments)    Drop in BP   Phenytoin Swelling   Sertraline Other (See Comments)    unknown   Sumatriptan Nausea Only   Tizanidine Other (See Comments)    Keep  her awake.    Topamax [Topiramate] Other (See Comments)    She reports that this makes her forgetful and causes her to studder   Triamcinolone     Steroid flare after joint injection, use other steroid for injections   Tramadol Anxiety and Palpitations    ROS Review of Systems    Objective:    Physical Exam  BP 138/76   Pulse 88   Ht '5\' 6"'  (1.676 m)   Wt 285 lb (129.3 kg)   LMP 12/28/2014   SpO2 94%   BMI 46.00 kg/m  Wt Readings from Last 3 Encounters:  02/08/21 285 lb (129.3 kg)  11/08/20 293 lb (132.9 kg)  10/07/20 289 lb (131.1 kg)     Health Maintenance Due  Topic Date Due   Hepatitis C Screening  Never done   PAP SMEAR-Modifier  10/27/2020    There are no preventive care reminders to display for this patient.  Lab Results  Component Value Date   TSH 1.53 08/09/2020   Lab Results  Component Value Date   WBC 10.5 02/08/2021   HGB 15.0 02/08/2021   HCT 46.5 (H) 02/08/2021   MCV 83.9 02/08/2021   PLT 309 02/08/2021   Lab Results  Component Value Date   NA 137 08/09/2020   K 4.5 08/09/2020   CO2 27 08/09/2020   GLUCOSE 128 (H) 08/09/2020   BUN 14 08/09/2020   CREATININE 0.86 08/09/2020   BILITOT 0.3 08/09/2020   ALKPHOS 98 11/29/2016   AST 14 08/09/2020   ALT 9 08/09/2020   PROT 6.8 08/09/2020   ALBUMIN 3.6 11/29/2016   CALCIUM 9.0 08/09/2020   ANIONGAP 10 01/12/2020   Lab Results  Component Value  Date   CHOL 207 (H) 08/09/2020   Lab Results  Component Value Date   HDL 33 (L) 08/09/2020   Lab Results  Component Value Date   [facility]  08/09/2020     Comment:     . LDL cholesterol not calculated. Triglyceride levels greater than 400 mg/dL invalidate calculated LDL results. . Reference range: <100 . Desirable range <100 mg/dL for primary prevention;   <70 mg/dL for patients with CHD or diabetic patients  with > or = 2 CHD risk factors. Marland Kitchen LDL-C is now calculated using the Martin-Hopkins  calculation, which is a validated novel  method providing  better accuracy than the Friedewald equation in the  estimation of LDL-C.  Cresenciano Genre et al. Annamaria Helling. 0737;106(26): 2061-2068  (http://education.[facility].com/faq/FAQ164)    Lab Results  Component Value Date   TRIG 422 (H) 08/09/2020   Lab Results  Component Value Date   CHOLHDL 6.3 (H) 08/09/2020   Lab Results  Component Value Date   HGBA1C 6.2 (A) 02/08/2021      Assessment & Plan:   Problem List Items Addressed This Visit       Cardiovascular and Mediastinum   Raynaud disease    She does get a dusky appearance of her toes and they will sometimes turn white when they get very cool.  She mostly wears flip-flops.  We did discuss ways to keep her feet warm.  Not clear if the amlodipine is helpful or not at this point so for now we will just continue with the 2.5 certainly if she notices an increase in symptoms over the winter we can try going up to 5 mg.  I did discuss that the burning and sensitivity is not related to the Raynaud's I think this is more related to her back and neuropathy.      Migraine without aura    Has already tried Imitrex, Maxalt, Aimovig, topamax Currently on Emgality and Nurtec as needed.  Feels the Nurtec is only partially effective but still having more than 8 migraines per mo.  Will try changing to [facility].  Will likely need PA.          Relevant Medications   Atogepant (QULIPTA) 10 MG TABS   ESSENTIAL HYPERTENSION, BENIGN - Primary    Well controlled. Continue current regimen. Follow up in  6 mo         Endocrine   IFG (impaired fasting glucose)    A1c looks good today at 6.2 though up slightly from previous of 5.7 continue to work on healthy food choices and plan to recheck again in 6 months.      Relevant Orders   POCT glycosylated hemoglobin (Hb A1C) (Completed)   Other Visit Diagnoses     Need for immunization against influenza       Relevant Orders   Flu Vaccine QUAD 54moIM (Fluarix, Fluzone & Alfiuria Quad  PF) (Completed)   Generalized abdominal pain       Relevant Orders   COMPLETE METABOLIC PANEL WITH GFR   Lipase   CBC with Differential/Platelet (Completed)   LLQ pain       Relevant Orders   CT Abdomen Pelvis W Contrast   COMPLETE METABOLIC PANEL WITH GFR   Lipase   CBC with Differential/Platelet (Completed)   Decreased appetite       Relevant Orders   COMPLETE METABOLIC PANEL WITH GFR   Lipase   CBC with Differential/Platelet (Completed)       Abdominal pain-mostly focused in  the right upper quadrant and left lower quadrant area.  She feels like she is headed on and off for years but in the last 4 to 5 months its really just been building and getting progressively worse.  Normal colonoscopy in March 6 months ago.  An EGD just showed some gastritis she is taking her PPI and H2 blocker regularly.  She says she notices a difference if she misses a dose.  She has persistent nausea and does get relief with Zofran.  She says the pain can get so intense that she actually feels like she will break into a sweat and is actually waking her up at night at times.  She feels like her bowels are moving normally.  Pain is most often and in the left lower quadrant.  Meds ordered this encounter  Medications   ondansetron (ZOFRAN) 4 MG tablet    Sig: TAKE 1 TABLET EVERY 8 HOURS AS NEEDED FOR NAUSEA AND VOMITING    Dispense:  10 tablet    Refill:  1   Atogepant (QULIPTA) 10 MG TABS    Sig: Take 10 mg by mouth daily.    Dispense:  30 tablet    Refill:  0    Pls d/c Emgality    Follow-up: No follow-ups on file.   I spent 42 minutes on the day of the encounter to include pre-visit record review, face-to-face time with the patient and post visit ordering of test.   Beatrice Lecher, MD

## 2021-02-08 NOTE — Assessment & Plan Note (Signed)
She does get a dusky appearance of her toes and they will sometimes turn white when they get very cool.  She mostly wears flip-flops.  We did discuss ways to keep her feet warm.  Not clear if the amlodipine is helpful or not at this point so for now we will just continue with the 2.5 certainly if she notices an increase in symptoms over the winter we can try going up to 5 mg.  I did discuss that the burning and sensitivity is not related to the Raynaud's I think this is more related to her back and neuropathy.

## 2021-02-08 NOTE — Telephone Encounter (Signed)
Please initial PA for [facility]

## 2021-02-08 NOTE — Assessment & Plan Note (Signed)
A1c looks good today at 6.2 though up slightly from previous of 5.7 continue to work on healthy food choices and plan to recheck again in 6 months.

## 2021-02-08 NOTE — Assessment & Plan Note (Signed)
Has already tried Imitrex, Maxalt, Aimovig, topamax Currently on Emgality and Nurtec as needed.  Feels the Nurtec is only partially effective but still having more than 8 migraines per mo.  Will try changing to [facility].  Will likely need PA.

## 2021-02-09 LAB — CBC WITH DIFFERENTIAL/PLATELET
Absolute Monocytes: 819 cells/uL (ref 200–950)
Basophils Absolute: 42 cells/uL (ref 0–200)
Basophils Relative: 0.4 %
Eosinophils Absolute: 158 cells/uL (ref 15–500)
Eosinophils Relative: 1.5 %
HCT: 46.5 % — ABNORMAL HIGH (ref 35.0–45.0)
Hemoglobin: 15 g/dL (ref 11.7–15.5)
Lymphs Abs: 2678 cells/uL (ref 850–3900)
MCH: 27.1 pg (ref 27.0–33.0)
MCHC: 32.3 g/dL (ref 32.0–36.0)
MCV: 83.9 fL (ref 80.0–100.0)
MPV: 11 fL (ref 7.5–12.5)
Monocytes Relative: 7.8 %
Neutro Abs: 6804 cells/uL (ref 1500–7800)
Neutrophils Relative %: 64.8 %
Platelets: 309 10*3/uL (ref 140–400)
RBC: 5.54 10*6/uL — ABNORMAL HIGH (ref 3.80–5.10)
RDW: 13.4 % (ref 11.0–15.0)
Total Lymphocyte: 25.5 %
WBC: 10.5 10*3/uL (ref 3.8–10.8)

## 2021-02-09 LAB — COMPLETE METABOLIC PANEL WITH GFR
AG Ratio: 1.4 (calc) (ref 1.0–2.5)
ALT: 12 U/L (ref 6–29)
AST: 18 U/L (ref 10–35)
Albumin: 4 g/dL (ref 3.6–5.1)
Alkaline phosphatase (APISO): 122 U/L (ref 37–153)
BUN: 15 mg/dL (ref 7–25)
CO2: 29 mmol/L (ref 20–32)
Calcium: 9.4 mg/dL (ref 8.6–10.4)
Chloride: 96 mmol/L — ABNORMAL LOW (ref 98–110)
Creat: 0.79 mg/dL (ref 0.50–1.03)
Globulin: 2.8 g/dL (calc) (ref 1.9–3.7)
Glucose, Bld: 109 mg/dL (ref 65–139)
Potassium: 3.4 mmol/L — ABNORMAL LOW (ref 3.5–5.3)
Sodium: 138 mmol/L (ref 135–146)
Total Bilirubin: 0.4 mg/dL (ref 0.2–1.2)
Total Protein: 6.8 g/dL (ref 6.1–8.1)
eGFR: 90 mL/min/{1.73_m2} (ref >=60)

## 2021-02-09 LAB — LIPASE: Lipase: 53 U/L (ref 7–60)

## 2021-02-10 NOTE — Progress Notes (Signed)
Hi Ashley Gomez, your potassium is just borderline low.  But not in a worrisome range.  Okay to increase your potassium in your diet with foods rich in potassium.  Kidney function is stable.  Hemoglobin looks good.

## 2021-02-14 ENCOUNTER — Ambulatory Visit (INDEPENDENT_AMBULATORY_CARE_PROVIDER_SITE_OTHER): Payer: Medicare Other

## 2021-02-14 ENCOUNTER — Other Ambulatory Visit: Payer: Self-pay

## 2021-02-14 DIAGNOSIS — G8929 Other chronic pain: Secondary | ICD-10-CM | POA: Diagnosis not present

## 2021-02-14 DIAGNOSIS — R1032 Left lower quadrant pain: Secondary | ICD-10-CM | POA: Diagnosis not present

## 2021-02-14 DIAGNOSIS — R109 Unspecified abdominal pain: Secondary | ICD-10-CM | POA: Diagnosis not present

## 2021-02-14 MED ORDER — IOHEXOL 350 MG/ML SOLN
100.0000 mL | Freq: Once | INTRAVENOUS | Status: AC | PRN
  Administered 2021-02-14: 80 mL via INTRAVENOUS

## 2021-02-16 ENCOUNTER — Other Ambulatory Visit: Payer: Self-pay | Admitting: Sports Medicine

## 2021-02-16 ENCOUNTER — Ambulatory Visit
Admission: RE | Admit: 2021-02-16 | Discharge: 2021-02-16 | Disposition: A | Discharge status: Home / Self Care | Payer: Medicare Other | Source: Ambulatory Visit | Attending: Sports Medicine | Admitting: Sports Medicine

## 2021-02-16 ENCOUNTER — Other Ambulatory Visit: Payer: Self-pay

## 2021-02-16 ENCOUNTER — Telehealth: Payer: Self-pay

## 2021-02-16 ENCOUNTER — Telehealth: Payer: Self-pay | Admitting: *Deleted

## 2021-02-16 DIAGNOSIS — M47816 Spondylosis without myelopathy or radiculopathy, lumbar region: Secondary | ICD-10-CM

## 2021-02-16 DIAGNOSIS — M545 Low back pain, unspecified: Secondary | ICD-10-CM | POA: Diagnosis not present

## 2021-02-16 NOTE — Progress Notes (Signed)
Hi Thelma, your pancreas and spleen look normal.  Bowel look okay as well no sign of reticulitis which is where the pockets become inflamed.  They did see just a little bit of plaque formation in the aorta but no aneurysm etc.  And they could see the area where you had the old splenic infarct but nothing again new or acute or worrisome.  No pelvic mass.  You do have some arthritis in your spine.  So if you are still having pain in that area then my recommendation would be to get you in with GI for further work-up.

## 2021-02-16 NOTE — Telephone Encounter (Signed)
Return the patient's call to confirm new telephone number, shared the results of her CT from 02/14/2021, and the patient wanted learn information on how to reset their MyChart. Once she has a new phone she will reach out for instructions on how to reset her account.

## 2021-02-16 NOTE — Telephone Encounter (Signed)
Pt called and asked about the status of the PA for the Qulipta 10mg . Will fwd to [employer]. For follow up.

## 2021-02-16 NOTE — Discharge Instructions (Signed)
Medial Branch Block Discharge Instructions ? ?Take over-the-counter and prescription medicines only as told by your health care provider. ? ?Do not drive the day of your procedure ? ?Return to your normal activities as told by your health care provider.  ? ?If injection site is sore you may ice the area for 20 minutes, 2-3 times a day.  ? ?Check your injection site every day for signs of infection. Check for: ?Redness, swelling, or pain. ?Fluid or blood. ?Warmth. ?Pus or a bad smell. ? ?Please contact our office at [phone] if: ?You have a fever or chills. ?You have any signs of infection. ?You develop any numbness or weakness. ? ? ?Thank you for visiting our office.   ?

## 2021-02-18 DIAGNOSIS — Z9889 Other specified postprocedural states: Secondary | ICD-10-CM | POA: Diagnosis not present

## 2021-02-21 ENCOUNTER — Telehealth: Payer: Self-pay

## 2021-02-21 NOTE — Telephone Encounter (Signed)
Medication: Atogepant (QULIPTA) 10 MG TABS Prior authorization submitted via CoverMyMeds on 02/21/2021 PA submission pending

## 2021-02-21 NOTE — Telephone Encounter (Signed)
Medication: Atogepant (QULIPTA) 10 MG TABS Prior authorization determination received Medication has been approved Approval dates: 02/21/2021-05/14/2022  Patient aware via: [place] aware: Yes Provider aware via this encounter

## 2021-02-28 ENCOUNTER — Ambulatory Visit (INDEPENDENT_AMBULATORY_CARE_PROVIDER_SITE_OTHER): Payer: Medicare Other | Admitting: Licensed Clinical Social Worker

## 2021-02-28 DIAGNOSIS — F411 Generalized anxiety disorder: Secondary | ICD-10-CM | POA: Diagnosis not present

## 2021-02-28 DIAGNOSIS — F331 Major depressive disorder, recurrent, moderate: Secondary | ICD-10-CM | POA: Diagnosis not present

## 2021-02-28 DIAGNOSIS — F4321 Adjustment disorder with depressed mood: Secondary | ICD-10-CM | POA: Diagnosis not present

## 2021-02-28 DIAGNOSIS — G894 Chronic pain syndrome: Secondary | ICD-10-CM

## 2021-02-28 DIAGNOSIS — F4310 Post-traumatic stress disorder, unspecified: Secondary | ICD-10-CM

## 2021-02-28 NOTE — Progress Notes (Signed)
THERAPIST PROGRESS NOTE  Session Time: 11:00 AM to 11:52 AM  Participation Level: Active  Behavioral Response: CasualAlertAnxious  Type of Therapy: Individual Therapy  Treatment Goals addressed:  stress management and anxiety but continue to work on depression, supportive interventions, trauma, grief, coping Interventions: Solution Focused, Strength-based, Supportive, and Other: coping  Summary: Ashley Gomez is a 50 y.o. female who presents with sweating all the time when goes somewhere doesn't know why if anxious. 6-8 months like this. Hard to exercise when hurt. Ablation for for lower back nerves this Wednesday. Excited about it and not being in pain but scared. Afraid it won't work. Mark age 23 and [place] 2 years.Has the kids now. Had shoulder surgery. Diagnosed with MS 2000. One neurologist says yes and another said no. One says definitely has it with her pain when relapsing. "I am over it." Trying to work on stress roll off shoulder not trying to get mad, trying not to worry although still does and when lay down mind races. Having the kids there helps.  Therapist provided positive feedback for working on this.  Vaughan Basta other grandmother wanted her to take Ashley Gomez said no didn't want to split them up.  There with her now with no end date for when they are returning.  Discussed pros and cons of keeping kids as they do well with her and she relates 1 reason is her son Ashley Gomez there. He is treating her a lot better over the last few months after she talked to him. Parenting skills not the same. With the kids keeps her busy talked about schedule with kids. Ashley Gomez doing a whole lot better. Discussed just some of the basic stressors of  raising kids in general. She has been a little more defiant. Ways like Mom, showed her. Asked her to go to [facility] for Halloween which is [name]'s birthday, said no but feels eventually she will come.  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    Therapist reviewed symptoms, facilitated expression of thoughts and feelings and utilize this as treatment intervention to help patient processing through stressors naming and labeling her feelings to help with working through feelings such as possible anxiety.  Provided positive feedback to reinforce positive coping patient is working on that feeds into her anxiety such as letting go of unnecessary worry.  Noted distraction with kids is helpful for that even though they can also add additional demands for her.  Therapist noted positive role patient plays, her use of parenting skills helpful in raising the kids.  Reviewed her feelings about taking care of them for patient have insight about what is best for her in terms of whether she would want to keep them or not.  Therapist during session at different points pointed out patient strengths, things she has learned for example to experience such as having the skills to know how to raise kids, being so active and her grandkids life, knowing how to cope with kids effectively setting limits that ultimately is what they need.  Discussed upcoming anniversary for her daughter's death says patient has made progress with her grief, helping her granddaughter with this process at the same time utilizing session for patient to talk about her loss and grief as a natural part of her loss.  Therapist provided active listening open questions, supportive interventions.  Suicidal/Homicidal: No  Plan: Return again in 3 weeks.2.  Processed feelings related  to stressors, work on effective coping skills  Diagnosis: Axis I: generalized anxiety disorder PTSD, complicated grief, chronic pain syndrome, Major depressive disorder, recurrent moderate    Axis II: No diagnosis    Cordella Register, LCSW 02/28/2021

## 2021-03-02 ENCOUNTER — Other Ambulatory Visit: Payer: Self-pay

## 2021-03-02 ENCOUNTER — Other Ambulatory Visit: Payer: Self-pay | Admitting: Sports Medicine

## 2021-03-02 ENCOUNTER — Ambulatory Visit
Admission: RE | Admit: 2021-03-02 | Discharge: 2021-03-02 | Disposition: A | Discharge status: Home / Self Care | Payer: Medicare Other | Source: Ambulatory Visit | Attending: Sports Medicine | Admitting: Sports Medicine

## 2021-03-02 DIAGNOSIS — M545 Low back pain, unspecified: Secondary | ICD-10-CM | POA: Diagnosis not present

## 2021-03-02 DIAGNOSIS — M47816 Spondylosis without myelopathy or radiculopathy, lumbar region: Secondary | ICD-10-CM

## 2021-03-02 MED ORDER — FENTANYL CITRATE PF 50 MCG/ML IJ SOSY
25.0000 ug | PREFILLED_SYRINGE | INTRAMUSCULAR | Status: DC | PRN
  Administered 2021-03-02 (×2): 25 ug via INTRAVENOUS

## 2021-03-02 MED ORDER — METHYLPREDNISOLONE ACETATE 40 MG/ML INJ SUSP (RADIOLOG
120.0000 mg | Freq: Once | INTRAMUSCULAR | Status: AC
  Administered 2021-03-02: 120 mg via INTRALESIONAL

## 2021-03-02 MED ORDER — KETOROLAC TROMETHAMINE 30 MG/ML IJ SOLN
30.0000 mg | Freq: Once | INTRAMUSCULAR | Status: AC
  Administered 2021-03-02: 30 mg via INTRAVENOUS

## 2021-03-02 MED ORDER — FLUMAZENIL 0.5 MG/5ML IV SOLN
0.1000 mg | Freq: Once | INTRAVENOUS | Status: AC
  Administered 2021-03-02: 0.1 mg via INTRAVENOUS

## 2021-03-02 MED ORDER — MIDAZOLAM HCL 2 MG/2ML IJ SOLN
1.0000 mg | INTRAMUSCULAR | Status: DC | PRN
  Administered 2021-03-02 (×3): 1 mg via INTRAVENOUS

## 2021-03-02 NOTE — Discharge Instructions (Signed)

## 2021-03-07 DIAGNOSIS — G8929 Other chronic pain: Secondary | ICD-10-CM | POA: Diagnosis not present

## 2021-03-07 DIAGNOSIS — Z79899 Other long term (current) drug therapy: Secondary | ICD-10-CM | POA: Diagnosis not present

## 2021-03-07 DIAGNOSIS — Z79891 Long term (current) use of opiate analgesic: Secondary | ICD-10-CM | POA: Diagnosis not present

## 2021-03-07 DIAGNOSIS — M545 Low back pain, unspecified: Secondary | ICD-10-CM | POA: Diagnosis not present

## 2021-03-09 ENCOUNTER — Encounter: Payer: Self-pay | Admitting: Family Medicine

## 2021-03-09 NOTE — Telephone Encounter (Signed)
No, I think she should reach out to pain management.  #1 because I think they need to be aware that it is causing significant nausea.  Into they need to be prescribing it so they can monitor how much and how often she is needing it.

## 2021-03-10 NOTE — Telephone Encounter (Signed)
Pt picked up Rx on 03/03/21

## 2021-03-21 ENCOUNTER — Ambulatory Visit (INDEPENDENT_AMBULATORY_CARE_PROVIDER_SITE_OTHER): Payer: Medicare Other | Admitting: Licensed Clinical Social Worker

## 2021-03-21 DIAGNOSIS — F331 Major depressive disorder, recurrent, moderate: Secondary | ICD-10-CM

## 2021-03-21 DIAGNOSIS — F4310 Post-traumatic stress disorder, unspecified: Secondary | ICD-10-CM | POA: Diagnosis not present

## 2021-03-21 DIAGNOSIS — G894 Chronic pain syndrome: Secondary | ICD-10-CM

## 2021-03-21 DIAGNOSIS — F411 Generalized anxiety disorder: Secondary | ICD-10-CM | POA: Diagnosis not present

## 2021-03-21 DIAGNOSIS — F4321 Adjustment disorder with depressed mood: Secondary | ICD-10-CM
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
## 2021-03-22 DIAGNOSIS — I10 Essential (primary) hypertension: Secondary | ICD-10-CM | POA: Diagnosis not present

## 2021-03-27 ENCOUNTER — Other Ambulatory Visit (HOSPITAL_COMMUNITY): Payer: Self-pay | Admitting: Psychiatry

## 2021-03-27 DIAGNOSIS — F331 Major depressive disorder, recurrent, moderate: Secondary | ICD-10-CM

## 2021-03-28 ENCOUNTER — Other Ambulatory Visit: Payer: Self-pay | Admitting: Family Medicine

## 2021-03-28 DIAGNOSIS — Z1231 Encounter for screening mammogram for malignant neoplasm of breast: Secondary | ICD-10-CM

## 2021-03-29 ENCOUNTER — Encounter: Payer: Self-pay | Admitting: Family Medicine

## 2021-03-29 NOTE — Telephone Encounter (Signed)
Spoke with patient and asked her to schedule a virtual appt to discuss her options.

## 2021-03-29 NOTE — Telephone Encounter (Signed)
It sounds like this patient had a vasovagal episode during the radiofrequency ablation so it was not completed, this is not really a contraindication as she is suggesting, I would need a virtual visit with her to discuss this in further detail rather than going back and forth on my chart.  We could just try another provider for the ablation.

## 2021-03-30 ENCOUNTER — Telehealth: Payer: Medicare Other | Admitting: Sports Medicine

## 2021-03-31 ENCOUNTER — Encounter (HOSPITAL_COMMUNITY): Payer: Self-pay | Admitting: Psychiatry

## 2021-03-31 ENCOUNTER — Telehealth (INDEPENDENT_AMBULATORY_CARE_PROVIDER_SITE_OTHER): Payer: Medicare Other | Admitting: Psychiatry

## 2021-03-31 DIAGNOSIS — F331 Major depressive disorder, recurrent, moderate: Secondary | ICD-10-CM | POA: Diagnosis not present

## 2021-03-31 DIAGNOSIS — F4310 Post-traumatic stress disorder, unspecified: Secondary | ICD-10-CM

## 2021-03-31 DIAGNOSIS — F411 Generalized anxiety disorder: Secondary | ICD-10-CM

## 2021-03-31 MED ORDER — DULOXETINE HCL 60 MG PO CPEP: 60.0000 mg | ORAL_CAPSULE | Freq: Every day | ORAL | 0 refills | Status: DC

## 2021-03-31 MED ORDER — BUSPIRONE HCL 7.5 MG PO TABS: 7.5000 mg | ORAL_TABLET | Freq: Two times a day (BID) | ORAL | 0 refills | Status: DC | PRN

## 2021-03-31 MED ORDER — DULOXETINE HCL 30 MG PO CPEP: ORAL_CAPSULE | ORAL | 0 refills | Status: DC
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
## 2021-04-04 ENCOUNTER — Encounter: Payer: Self-pay | Admitting: Family Medicine

## 2021-04-04 DIAGNOSIS — G43019 Migraine without aura, intractable, without status migrainosus: Secondary | ICD-10-CM

## 2021-04-04 MED ORDER — QULIPTA 10 MG PO TABS
10.0000 mg | ORAL_TABLET | Freq: Every day | ORAL | 1 refills | Status: DC
Start: 2021-04-04 — End: 2021-11-09

## 2021-04-06 ENCOUNTER — Telehealth (INDEPENDENT_AMBULATORY_CARE_PROVIDER_SITE_OTHER): Payer: Medicare Other | Admitting: Sports Medicine

## 2021-04-06 DIAGNOSIS — M545 Low back pain, unspecified: Secondary | ICD-10-CM | POA: Diagnosis not present

## 2021-04-06 DIAGNOSIS — G8929 Other chronic pain: Secondary | ICD-10-CM | POA: Diagnosis not present

## 2021-04-06 DIAGNOSIS — M47816 Spondylosis without myelopathy or radiculopathy, lumbar region: Secondary | ICD-10-CM

## 2021-04-06 DIAGNOSIS — I10 Essential (primary) hypertension: Secondary | ICD-10-CM | POA: Diagnosis not present

## 2021-04-06 DIAGNOSIS — R7303 Prediabetes: Secondary | ICD-10-CM | POA: Diagnosis not present

## 2021-04-06 DIAGNOSIS — Z86718 Personal history of other venous thrombosis and embolism: Secondary | ICD-10-CM | POA: Diagnosis not present

## 2021-04-06 DIAGNOSIS — E785 Hyperlipidemia, unspecified: Secondary | ICD-10-CM | POA: Diagnosis not present

## 2021-04-06 DIAGNOSIS — Z79899 Other long term (current) drug therapy: Secondary | ICD-10-CM | POA: Diagnosis not present

## 2021-04-06 DIAGNOSIS — Z79891 Long term (current) use of opiate analgesic: Secondary | ICD-10-CM | POA: Diagnosis not present

## 2021-04-06 NOTE — Progress Notes (Signed)
Options since she didn't get to have the ablation done.

## 2021-04-06 NOTE — Progress Notes (Signed)
   Virtual Visit via Telephone   I connected with  [name]  on 04/06/21 by telephone/telehealth and verified that I am speaking with the correct person using two identifiers.   I discussed the limitations, risks, security and privacy concerns of performing an evaluation and management service by telephone, including the higher likelihood of inaccurate diagnosis and treatment, and the availability of in person appointments.  We also discussed the likely need of an additional face to face encounter for complete and high quality delivery of care.  I also discussed with the patient that there may be a patient responsible charge related to this service. The patient expressed understanding and wishes to proceed.  Provider location is in medical facility. Patient location is at their home, different from provider location. People involved in care of the patient during this telehealth encounter were myself, my nurse/medical assistant, and my front office/scheduling team member.  Review of Systems: No fevers, chills, night sweats, weight loss, chest pain, or shortness of breath.   Objective Findings:    General: Speaking full sentences, no audible heavy breathing.  Sounds alert and appropriately interactive.    Independent interpretation of tests performed by another provider:   None.  Brief History, Exam, Impression, and Recommendations:    Lumbar spondylosis Ashley Gomez is a 50 year old female, she has a long history of chronic low back pain, she has been working with a pain management provider and is currently on Nucynta and hydrocodone. MRIs showed fairly good looking discs with only a bit of desiccation at the L5-S1 level, no obvious disc protrusions, she did have fairly significant L4-S1 facet arthritis bilaterally, she responded really well to facet branch blocks, L4-S1, bilateral with [place] imaging with 95% pain relief, but it sounds like she suffered a vasovagal response during the  radiofrequency ablation and the procedure was aborted. The interventional radiologist at [facility] [facility] had suggested trying a different provider that may be able to provide heavier sedation, it sounds like she was given Versed and fentanyl during the procedure. I would like Dr. Francesco Runner to weigh in, to see if he can do the procedure with potentially higher sedation. My hope is that he does not have to repeat the medial branch blocks.   I discussed the above assessment and treatment plan with the patient. The patient was provided an opportunity to ask questions and all were answered. The patient agreed with the plan and demonstrated an understanding of the instructions.   The patient was advised to call back or seek an in-person evaluation if the symptoms worsen or if the condition fails to improve as anticipated.   I provided 30 minutes of verbal and non-verbal time during this encounter date, time was needed to gather information, review chart, records, communicate/coordinate with staff remotely, as well as complete documentation.   ___________________________________________ Gwen Her. Dianah Field, M.D., ABFM., CAQSM. Primary Care and Sports Medicine [facility]  Adjunct Professor of [place]

## 2021-04-06 NOTE — Assessment & Plan Note (Signed)
Ashley Gomez is a 50 year old female, she has a long history of chronic low back pain, she has been working with a pain management provider and is currently on Nucynta and hydrocodone. MRIs showed fairly good looking discs with only a bit of desiccation at the L5-S1 level, no obvious disc protrusions, she did have fairly significant L4-S1 facet arthritis bilaterally, she responded really well to facet branch blocks, L4-S1, bilateral with [place] imaging with 95% pain relief, but it sounds like she suffered a vasovagal response during the radiofrequency ablation and the procedure was aborted. The interventional radiologist at [facility] had suggested trying a different provider that may be able to provide heavier sedation, it sounds like she was given Versed and fentanyl during the procedure. I would like Dr. Francesco Runner to weigh in, to see if he can do the procedure with potentially higher sedation. My hope is that he does not have to repeat the medial branch blocks.

## 2021-04-11 DIAGNOSIS — Z79899 Other long term (current) drug therapy: Secondary | ICD-10-CM | POA: Diagnosis not present

## 2021-04-14 ENCOUNTER — Other Ambulatory Visit: Payer: Self-pay | Admitting: Family Medicine

## 2021-04-18 ENCOUNTER — Ambulatory Visit (INDEPENDENT_AMBULATORY_CARE_PROVIDER_SITE_OTHER): Payer: Medicare Other | Admitting: Licensed Clinical Social Worker

## 2021-04-18 DIAGNOSIS — F411 Generalized anxiety disorder: Secondary | ICD-10-CM

## 2021-04-18 DIAGNOSIS — F4321 Adjustment disorder with depressed mood: Secondary | ICD-10-CM | POA: Diagnosis not present

## 2021-04-18 DIAGNOSIS — F4310 Post-traumatic stress disorder, unspecified: Secondary | ICD-10-CM

## 2021-04-18 DIAGNOSIS — F331 Major depressive disorder, recurrent, moderate: Secondary | ICD-10-CM | POA: Diagnosis not present

## 2021-04-18 DIAGNOSIS — G894 Chronic pain syndrome: Secondary | ICD-10-CM

## 2021-04-18 NOTE — Progress Notes (Signed)
THERAPIST PROGRESS NOTE  Session Time: 11:01 AM to 11:51 AM  Participation Level: Active  Behavioral Response: CasualAlertDysphoric  Type of Therapy: Individual Therapy  Treatment Goals addressed:  stress management and anxiety but continue to work on depression, supportive interventions, trauma, grief, coping Interventions: Solution Focused, Strength-based, Supportive, and Other: grief, coping  Summary: Ashley Gomez is a 50 y.o. female who presents with visiting sister and it is nice break for her. Nephew and girlfriend visited and likes new girlfriend. Had to get away and patient says her younger cousin has cancer. "Double whammy as far as brining back the past." Grandmother was her world, she died of cancer. Pancreatic cancer. Mat uncle he passed away 3-4 years ago from lung cancer. Lattie Haw daughter of favorite aunt who wasn't talking to her when started talking more to mom. Talking to her more when found out that her daughter had cancer. It is bringing back so much memories of grandmother knowing she is not going to get better and die. Brings back losing Healther and [name] how is she going to deal with this. Wynn Banker, has bladder cancer and PET scan and in her bones. Feels bad for feeling this way cursing herself under breath,  feel bad about how family treated her after lost [name], that nobody wanted her to bring it up. Didn't have support from family. Now can't help but think favorite aunt will know how it feels. Her favorite aunt who was always there but still couldn't bring up Shepherd with her like everyone else you don't do it. Now can feel what she went through. Feels wrong that even have that thought. Patient mentioned approved for weight surgery and coughing during session explaining that she is trying to quit smoking.  Wonders why on talking to her more and think going through this and need her. Patient identifies anger. They had a close bond not the same.  Was not talking to  her when she was talking to mom patient related how she feels about her mom does not want anyone to bring her up if they are talking to her. Mom lives minutes away and not involved with patient and her kids and everything they have been through. Patient says If people could feel like her for five minutes to understand what she goes through. Explains that she could go in the woods and scream.  End of session talked about visiting [name]'s grave upset because there are brown spots figure out how to clean or will replace upset about that  Therapist reviewed symptoms, facilitated expression of thoughts and feelings and worked on loss as loss of cousin is bringing up earlier losses for patient as well as loss of cousin.  Explored with patient feeling bad for thinking herself only people but understand when she went through therapist pointed out to patient she had a right to fill her by how people reacted, that they were not there for her, does not make anything wrong with her for having been legitimately hurt.  Patient can still be a good person having these feelings, because at the same time she will continue to give others the support they need despite being heard in their shortcomings.  Therapist utilized treatment intervention of self compassion as well as development of insight to challenge patient's inaccurate negative thoughts of self.  Noted loss can be triggered by many things the holidays can bring up losses, can happen periods of time when were in low mood can happen unexpectedly.  Noted  progress patient has had being less responsible for loss of her daughter at the same time recognizing it is a hard feeling to let go of completely.  Therapist explained when we deal with overwhelming losses sometimes we do not know how to explain it so that is where we come in and think we could have done something to prevent it.  Noted also patient has gotten stronger through tough life experiences therapist sees that as  positive that she can speak up for herself more but at the same time not losing her kindness to others.  Therapist had patient focus on something positive as we talked about people shortcomings noted her sister shows she cares because her all the time.  Discussed boundaries with mom therapist has known patient for a while and has seen mom be hurtful so feedback for patient was boundaries with mom is a way to help patient not be hurt by her.  Completed treatment plan and patient gave verbal consent to complete  Suicidal/Homicidal: No  Plan: Return again in 2 weeks.2.  Work on issues that are coming up now that cousin has cancer including past losses and grief, work with patient on stressors, depression and anxiety  Diagnosis: Axis I: generalized anxiety disorder PTSD, complicated grief, chronic pain syndrome, Major depressive disorder, recurrent moderate    Axis II: No diagnosis    Cordella Register, LCSW 04/18/2021

## 2021-04-25 DIAGNOSIS — L02413 Cutaneous abscess of right upper limb: Secondary | ICD-10-CM | POA: Diagnosis not present

## 2021-04-25 DIAGNOSIS — L821 Other seborrheic keratosis: Secondary | ICD-10-CM | POA: Diagnosis not present

## 2021-04-28 ENCOUNTER — Encounter: Payer: Self-pay | Admitting: Family Medicine

## 2021-04-29 ENCOUNTER — Ambulatory Visit (INDEPENDENT_AMBULATORY_CARE_PROVIDER_SITE_OTHER): Payer: Medicare Other | Admitting: Sports Medicine

## 2021-04-29 DIAGNOSIS — Z0000 Encounter for general adult medical examination without abnormal findings: Secondary | ICD-10-CM | POA: Diagnosis not present

## 2021-04-29 NOTE — Patient Instructions (Addendum)
[place] Maintenance Summary and Written Plan of Care  Ashley Gomez ,  Thank you for allowing me to perform your Medicare Annual Wellness Visit and for your ongoing commitment to your health.   Health Maintenance & Immunization History Health Maintenance  Topic Date Due   PAP SMEAR-Modifier  04/29/2021 (Originally 10/27/2020)   Pneumococcal Vaccine 17-52 Years old (2 - PCV) 04/29/2022 (Originally 03/06/2020)   Hepatitis C Screening  04/29/2022 (Originally 12[birth year]13/1986)   Zoster Vaccines- Shingrix (1 of 2) 05/10/2022 (Originally 12/13/1987)   COVID-19 Vaccine (1) 02/25/2024 (Originally 06/14/1971)   MAMMOGRAM  04/29/2022   TETANUS/TDAP  03/11/2030   COLONOSCOPY (Pts 45-30yrs Insurance coverage will need to be confirmed)  07/20/2030   INFLUENZA VACCINE  Completed   HIV Screening  Completed   HPV VACCINES  Aged Out   Immunization History  Administered Date(s) Administered   Influenza Split 02/14/2011   Influenza Whole 03/09/2009, 02/28/2010   Influenza, Seasonal, Injecte, Preservative Fre 03/17/2017   Influenza,inj,Quad PF,6+ Mos 03/04/2013, 03/06/2014, 01/29/2015, 02/29/2016, 01/23/2018, 03/07/2019, 03/11/2020, 02/08/2021   Influenza,inj,quad, With Preservative 02/08/2021   Pneumococcal Polysaccharide-23 03/07/2019   Tdap 10/18/2010, 03/11/2020    These are the patient goals that we discussed:  Goals Addressed              This Visit's Progress     Patient Stated (pt-stated)        Would like to loose 70-80 lbs        This is a list of Health Maintenance Items that are overdue or due now: Pneumococcal vaccine  Screening Pap smear and pelvic exam  Shingrix  Mammogram- scheduled for 05/04/21.  Orders/Referrals Placed Today: No orders of the defined types were placed in this encounter.  (Contact our referral department at [phone] if you have not spoken with someone about your referral appointment within the next 5  days)    Follow-up Plan Follow-up with Hali Marry, MD as planned Schedule your shingrix vaccine at your pharmacy.  Schedule your pap smear appointment with your gyn.  Discuss pneumonia vaccine with Dr. Madilyn Fireman at your next appointment.  Medicare wellness visit in one year.  Patient will access AVS on mychart.      Health Maintenance, Female Adopting a healthy lifestyle and getting preventive care are important in promoting health and wellness. Ask your health care provider about: The right schedule for you to have regular tests and exams. Things you can do on your own to prevent diseases and keep yourself healthy. What should I know about diet, weight, and exercise? Eat a healthy diet  Eat a diet that includes plenty of vegetables, fruits, low-fat dairy products, and lean protein. Do not eat a lot of foods that are high in solid fats, added sugars, or sodium. Maintain a healthy weight Body mass index (BMI) is used to identify weight problems. It estimates body fat based on height and weight. Your health care provider can help determine your BMI and help you achieve or maintain a healthy weight. Get regular exercise Get regular exercise. This is one of the most important things you can do for your health. Most adults should: Exercise for at least 150 minutes each week. The exercise should increase your heart rate and make you sweat (moderate-intensity exercise). Do strengthening exercises at least twice a week. This is in addition to the moderate-intensity exercise. Spend less time sitting. Even light physical activity can be beneficial. Watch cholesterol and blood lipids Have your blood tested for  lipids and cholesterol at 52 years of age, then have this test every 5 years. Have your cholesterol levels checked more often if: Your lipid or cholesterol levels are high. You are older than 52 years of age. You are at high risk for heart disease. What should I know about  cancer screening? Depending on your health history and family history, you may need to have cancer screening at various ages. This may include screening for: Breast cancer. Cervical cancer. Colorectal cancer. Skin cancer. Lung cancer. What should I know about heart disease, diabetes, and high blood pressure? Blood pressure and heart disease High blood pressure causes heart disease and increases the risk of stroke. This is more likely to develop in people who have high blood pressure readings or are overweight. Have your blood pressure checked: Every 3-5 years if you are 58-40 years of age. Every year if you are 74 years old or older. Diabetes Have regular diabetes screenings. This checks your fasting blood sugar level. Have the screening done: Once every three years after age 81 if you are at a normal weight and have a low risk for diabetes. More often and at a younger age if you are overweight or have a high risk for diabetes. What should I know about preventing infection? Hepatitis B If you have a higher risk for hepatitis B, you should be screened for this virus. Talk with your health care provider to find out if you are at risk for hepatitis B infection. Hepatitis C Testing is recommended for: Everyone born from [birth year] through 1967. Anyone with known risk factors for hepatitis C. Sexually transmitted infections (STIs) Get screened for STIs, including gonorrhea and chlamydia, if: You are sexually active and are younger than 52 years of age. You are older than 52 years of age and your health care provider tells you that you are at risk for this type of infection. Your sexual activity has changed since you were last screened, and you are at increased risk for chlamydia or gonorrhea. Ask your health care provider if you are at risk. Ask your health care provider about whether you are at high risk for HIV. Your health care provider may recommend a prescription medicine to help prevent HIV  infection. If you choose to take medicine to prevent HIV, you should first get tested for HIV. You should then be tested every 3 months for as long as you are taking the medicine. Pregnancy If you are about to stop having your period (premenopausal) and you may become pregnant, seek counseling before you get pregnant. Take 400 to 800 micrograms (mcg) of folic acid every day if you become pregnant. Ask for birth control (contraception) if you want to prevent pregnancy. Osteoporosis and menopause Osteoporosis is a disease in which the bones lose minerals and strength with aging. This can result in bone fractures. If you are 33 years old or older, or if you are at risk for osteoporosis and fractures, ask your health care provider if you should: Be screened for bone loss. Take a calcium or vitamin D supplement to lower your risk of fractures. Be given hormone replacement therapy (HRT) to treat symptoms of menopause. Follow these instructions at home: Alcohol use Do not drink alcohol if: Your health care provider tells you not to drink. You are pregnant, may be pregnant, or are planning to become pregnant. If you drink alcohol: Limit how much you have to: 0-1 drink a day. Know how much alcohol is in your drink. In  the [place] one drink equals one 12 oz bottle of beer (355 mL), one 5 oz glass of wine (148 mL), or one 1 oz glass of hard liquor (44 mL). Lifestyle Do not use any products that contain nicotine or tobacco. These products include cigarettes, chewing tobacco, and vaping devices, such as e-cigarettes. If you need help quitting, ask your health care provider. Do not use street drugs. Do not share needles. Ask your health care provider for help if you need support or information about quitting drugs. General instructions Schedule regular health, dental, and eye exams. Stay current with your vaccines. Tell your health care provider if: You often feel depressed. You have ever been abused  or do not feel safe at home. Summary Adopting a healthy lifestyle and getting preventive care are important in promoting health and wellness. Follow your health care provider's instructions about healthy diet, exercising, and getting tested or screened for diseases. Follow your health care provider's instructions on monitoring your cholesterol and blood pressure. This information is not intended to replace advice given to you by your health care provider. Make sure you discuss any questions you have with your health care provider. Document Revised: 09/20/2020 Document Reviewed: 09/20/2020 Elsevier Patient Education  [employer].

## 2021-04-29 NOTE — Progress Notes (Signed)
MEDICARE ANNUAL WELLNESS VISIT  04/29/2021  Telephone Visit Disclaimer This Medicare AWV was conducted by telephone due to national recommendations for restrictions regarding the COVID-19 Pandemic (e.g. social distancing).  I verified, using two identifiers, that I am speaking with Ashley Gomez or their authorized healthcare agent. I discussed the limitations, risks, security, and privacy concerns of performing an evaluation and management service by telephone and the potential availability of an in-person appointment in the future. The patient expressed understanding and agreed to proceed.  Location of Patient: Home Location of Provider (nurse):  In the office.  Subjective:    Ashley Gomez is a 50 y.o. female patient of Metheney, Rene Kocher, MD who had a Medicare Annual Wellness Visit today via telephone. Ashley Gomez is Legally disabled and lives with their son. she has 3 children, one has deceased. she reports that she is socially active and does interact with friends/family regularly. she is minimally physically active and enjoys reading, television and spending time with children and grandchildren..  Patient Care Team: Hali Marry, MD as PCP - General (Family Medicine) Alan Ripper, [place] as Consulting Physician (Pain Medicine) Silverio Decamp, MD as Consulting Physician (Sports Medicine) Celene Squibb., MD as Referring Physician (Neurology) Everardo Pacific, [place] as Referring Physician (Nurse Practitioner)  Advanced Directives 04/29/2021 02/27/2020 01/15/2020 01/08/2020 10/03/2019 10/21/2018 06/21/2018  Does Patient Have a Medical Advance Directive? No No No No No No No  Would patient like information on creating a medical advance directive? No - Patient declined No - Patient declined Yes (MAU/Ambulatory/Procedural Areas - Information given) No - Patient declined - Yes (ED - Information included in AVS) No - Patient declined    Hospital Utilization Over the Past  12 Months: # of hospitalizations or ER visits: 0 # of surgeries: 1  Review of Systems    Patient reports that her overall health is worse compared to last year.  History obtained from chart review and the patient  Patient Reported Readings (BP, Pulse, CBG, Weight, etc) none  Pain Assessment Pain : No/denies pain     Current Medications & Allergies (verified) Allergies as of 04/29/2021       Reactions   Celebrex [celecoxib] Other (See Comments)   Swelling in extremities   Phenytoin Swelling   Topamax [topiramate] Other (See Comments)   She reports that this makes her forgetful and causes her to studder   Ace Inhibitors Cough   Baclofen Nausea Only   Celexa [citalopram Hydrobromide] Other (See Comments)   Dizziness Tolerated Lexapro   Codeine Nausea Only   Methocarbamol Nausea Only   Sumatriptan Nausea Only   Tizanidine Other (See Comments)   Keeps her awake.    Tramadol Anxiety, Palpitations   Triamcinolone Other (See Comments)   Steroid flare after joint injection, use other steroid for injections        Medication List        Accurate as of April 29, 2021  1:53 PM. If you have any questions, ask your nurse or doctor.          acetaminophen 500 MG tablet Commonly known as: TYLENOL Take by mouth.   amLODipine 2.5 MG tablet Commonly known as: NORVASC Take 1 tablet (2.5 mg total) by mouth daily.   ammonium lactate 12 % lotion Commonly known as: LAC-HYDRIN See admin instructions.   atorvastatin 20 MG tablet Commonly known as: LIPITOR Take by mouth.   azelastine 0.1 % nasal spray Commonly known as: ASTELIN As needed  BD Pen Needle Nano 2nd Gen 32G X 4 MM Misc Generic drug: Insulin Pen Needle   busPIRone 7.5 MG tablet Commonly known as: BUSPAR Take by mouth.   busPIRone 7.5 MG tablet Commonly known as: BUSPAR Take 1 tablet (7.5 mg total) by mouth 2 (two) times daily as needed.   chlorthalidone 25 MG tablet Commonly known as:  HYGROTON Take by mouth.   clindamycin 1 % external solution Commonly known as: CLEOCIN T Apply topically.   clobetasol cream 0.05 % Commonly known as: TEMOVATE Apply topically.   cyclobenzaprine 10 MG tablet Commonly known as: FLEXERIL Take by mouth.   diazepam 5 MG tablet Commonly known as: VALIUM Take by mouth.   diazepam 5 MG tablet Commonly known as: VALIUM TAKE 1 TABLET BY MOUTH DAILY AS NEEDED FOR ANXIETY.   DULoxetine 30 MG capsule Commonly known as: CYMBALTA YAKE WITH 60MG . TOTAL DAILY DOSE IS  90 MG   DULoxetine 60 MG capsule Commonly known as: CYMBALTA Take 1 capsule (60 mg total) by mouth daily.   famotidine 20 MG tablet Commonly known as: PEPCID TAKE 1 TABLET BY MOUTH EVERY DAY   HYDROcodone-acetaminophen 10-325 MG tablet Commonly known as: NORCO Take 1 tablet by mouth every 6 (six) hours as needed for pain. for pain   levocetirizine 5 MG tablet Commonly known as: XYZAL Take 5 mg by mouth daily.   metoprolol succinate 25 MG 24 hr tablet Commonly known as: TOPROL-XL TAKE 1 TABLET BY MOUTH EVERY DAY   mupirocin ointment 2 % Commonly known as: BACTROBAN SMARTSIG:1 Application Topical 2-3 Times Daily   Narcan 4 MG/0.1ML Liqd nasal spray kit Generic drug: naloxone USE AS DIRECTED   NON FORMULARY [facility]  Circulation-#5   Nucynta ER 150 MG Tb12 Generic drug: Tapentadol HCl Take 1 tablet by mouth in the morning and at bedtime.   omeprazole 40 MG capsule Commonly known as: PRILOSEC TAKE 1 CAPSULE BY MOUTH EVERY DAY What changed: Another medication with the same name was removed. Continue taking this medication, and follow the directions you see here.   ondansetron 4 MG tablet Commonly known as: ZOFRAN TAKE 1 TABLET EVERY 8 HOURS AS NEEDED FOR NAUSEA AND VOMITING   ondansetron 8 MG disintegrating tablet Commonly known as: ZOFRAN-ODT Take 8 mg by mouth every 8 (eight) hours as needed.   Qulipta 10 MG Tabs Generic drug:  Atogepant Take 10 mg by mouth daily.   Victoza 18 MG/3ML Sopn Generic drug: liraglutide Inject 1.8 mg into the skin daily.        History (reviewed): Past Medical History:  Diagnosis Date   Anxiety    Arthritis    CVA (cerebral infarction)    Old CVA on MRI brain   Depression    DVT (deep venous thrombosis) ([place]) 81/82/9937   L basilic vein    Facet hypertrophy of lumbar region    MRI 2007   Fibromyalgia    GERD (gastroesophageal reflux disease)    Hypertension    Migraines    MS (multiple sclerosis) ([facility])    Neuromuscular disorder ([facility])    Obesity    Pre-diabetes    PTSD (post-traumatic stress disorder)    Seizures ([place]) 2011   no seizure since onset   Smoking    Stroke [facility])    TIA, >8years ago   Past Surgical History:  Procedure Laterality Date   CHOLECYSTECTOMY  10/2017   CHONDROPLASTY Left 12/30/2014   Procedure: CHONDROPLASTY;  Surgeon: Leandrew Koyanagi, MD;  Location: [place];  Service: Orthopedics;  Laterality: Left;   KNEE ARTHROSCOPY Left 12/30/2014   Procedure: LEFT KNEE ARTHROSCOPY WITH   CHONDROPLASTY;  Surgeon: Leandrew Koyanagi, MD;  Location: [place];  Service: Orthopedics;  Laterality: Left;   PARTIAL KNEE ARTHROPLASTY Left 08/03/2016   Procedure: LEFT UNICOMPARTMENTAL KNEE ARTHROPLASTY;  Surgeon: Leandrew Koyanagi, MD;  Location: [place];  Service: Orthopedics;  Laterality: Left;   SHOULDER ARTHROSCOPY WITH BICEPSTENOTOMY Left 01/15/2020   Procedure: SHOULDER ARTHROSCOPY WITH BICEPSTENOTOMY;  Surgeon: Hiram Gash, MD;  Location: [place];  Service: Orthopedics;  Laterality: Left;   SHOULDER ARTHROSCOPY WITH DISTAL CLAVICLE RESECTION Left 01/15/2020   Procedure: SHOULDER ARTHROSCOPY WITH DISTAL CLAVICULECTOMY;  Surgeon: Hiram Gash, MD;  Location: [place];  Service: Orthopedics;  Laterality: Left;   SHOULDER ARTHROSCOPY WITH ROTATOR CUFF REPAIR AND SUBACROMIAL DECOMPRESSION Left 01/15/2020    Procedure: SHOULDER ARTHROSCOPY DEBRIDMENT WITH ROTATOR CUFF REPAIR, SUBACROMIAL DECOMPRESSION WITH ACROMIPLASTY, AND BICEP TENOTOMY;  Surgeon: Hiram Gash, MD;  Location: [place];  Service: Orthopedics;  Laterality: Left;   TONSILLECTOMY     TUBAL LIGATION  1992   Family History  Problem Relation Age of Onset   Cancer Mother 51       melanoma   Hypertension Sister    Heart disease Sister        valve disease   Depression Sister    Diabetes Sister    Sjogren's syndrome Sister    Social History   Socioeconomic History   Marital status: Divorced    Spouse name: Not on file   Number of children: 3   Years of education: 12   Highest education level: GED or equivalent  Occupational History   Occupation: Legally disabled  Tobacco Use   Smoking status: Every Day    Packs/day: 1.00    Years: 25.00    Pack years: 25.00    Types: Cigarettes   Smokeless tobacco: Never   Tobacco comments:    advised to d/c by 3 cigs per week.     04/29/21- for past two weeks, she is down to half pack a day.  Vaping Use   Vaping Use: Never used  Substance and Sexual Activity   Alcohol use: No    Alcohol/week: 0.0 standard drinks   Drug use: No   Sexual activity: Yes    Birth control/protection: None, Post-menopausal  Other Topics Concern   Not on file  Social History Narrative   Her son lives with her after a car accident that he was involved in. She had three children, one has deceased. She enjoys reading, watching television and spending time with her children and grandchildren.   Social Determinants of Health   Financial Resource Strain: Low Risk    Difficulty of Paying Living Expenses: Not hard at all  Food Insecurity: No Food Insecurity   Worried About Charity fundraiser in the Last Year: Never true   [place] in the Last Year: Never true  Transportation Needs: No Transportation Needs   Lack of Transportation (Medical): No   Lack of Transportation  (Non-Medical): No  Physical Activity: Inactive   Days of Exercise per Week: 0 days   Minutes of Exercise per Session: 0 min  Stress: Stress Concern Present   Feeling of Stress : Rather much  Social Connections: Socially Isolated   Frequency of Communication with Friends and Family: More than three times  a week   Frequency of Social Gatherings with Friends and Family: Once a week   Attends Religious Services: Never   Marine scientist or Organizations: No   Attends Archivist Meetings: Never   Marital Status: Divorced    Activities of Daily Living In your present state of health, do you have any difficulty performing the following activities: 04/29/2021  Hearing? N  Vision? N  Difficulty concentrating or making decisions? Y  Comment has noticed some memory issues.  Walking or climbing stairs? Y  Comment due to chronic back pain.  Dressing or bathing? N  Doing errands, shopping? N  Preparing Food and eating ? N  Using the Toilet? Y  In the past six months, have you accidently leaked urine? N  Do you have problems with loss of bowel control? N  Comment she had a few accidents when she had the stomach virus.  Managing your Medications? N  Managing your Finances? N  Housekeeping or managing your Housekeeping? N  Some recent data might be hidden    Patient Education/ Literacy How often do you need to have someone help you when you read instructions, pamphlets, or other written materials from your doctor or pharmacy?: 1 - Never What is the last grade level you completed in school?: GED  Exercise Current Exercise Habits: The patient does not participate in regular exercise at present, Exercise limited by: orthopedic condition(s) (due to her back pain.)  Diet Patient reports consuming 1 meals a day and 1 snack(s) a day Patient reports that her primary diet is: Regular Patient reports that she does have regular access to food.   Depression Screen PHQ 2/9 Scores  04/29/2021 06/11/2020 12/04/2019 08/04/2019 01/23/2018 07/28/2015  PHQ - 2 Score 6 - '4 5 4 6  ' PHQ- 9 Score 20 - '17 18 18 25  ' Exception Documentation - Other- indicate reason in comment box - - - -  Not completed - pt follows with behavioral health - - - -  Some encounter information is confidential and restricted. Go to Review Flowsheets activity to see all data.     Fall Risk Fall Risk  04/29/2021 12/04/2019 08/04/2019 04/04/2019  Falls in the past year? 0 1 1 0  Number falls in past yr: 0 1 0 0  Injury with Fall? 0 0 1 0  Risk for fall due to : No Fall Risks Impaired balance/gait Impaired balance/gait -  Follow up Falls evaluation completed Falls prevention discussed Falls prevention discussed -     Objective:  Ashley Gomez seemed alert and oriented and she participated appropriately during our telephone visit.  Blood Pressure Weight BMI  BP Readings from Last 3 Encounters:  03/02/21 (!) 150/91  02/16/21 117/90  02/08/21 138/76   Wt Readings from Last 3 Encounters:  02/08/21 285 lb (129.3 kg)  11/08/20 293 lb (132.9 kg)  10/07/20 289 lb (131.1 kg)   BMI Readings from Last 1 Encounters:  02/08/21 46.00 kg/m    *Unable to obtain current vital signs, weight, and BMI due to telephone visit type  Hearing/Vision  Ashley Gomez did not seem to have difficulty with hearing/understanding during the telephone conversation Reports that she has had a formal eye exam by an eye care professional within the past year Reports that she has not had a formal hearing evaluation within the past year *Unable to fully assess hearing and vision during telephone visit type  Cognitive Function: 6CIT Screen 04/29/2021  What Year? 0 points  What  month? 0 points  What time? 0 points  Count back from 20 0 points  Months in reverse 0 points  Repeat phrase 0 points  Total Score 0   (Normal:0-7, Significant for Dysfunction: >8)  Normal Cognitive Function Screening: Yes   Immunization & Health  Maintenance Record Immunization History  Administered Date(s) Administered   Influenza Split 02/14/2011   Influenza Whole 03/09/2009, 02/28/2010   Influenza, Seasonal, Injecte, Preservative Fre 03/17/2017   Influenza,inj,Quad PF,6+ Mos 03/04/2013, 03/06/2014, 01/29/2015, 02/29/2016, 01/23/2018, 03/07/2019, 03/11/2020, 02/08/2021   Pneumococcal Polysaccharide-23 03/07/2019   Tdap 10/18/2010, 03/11/2020    Health Maintenance  Topic Date Due   Hepatitis C Screening  Never done   Pneumococcal Vaccine 48-6 Years old (2 - PCV) 03/06/2020   PAP SMEAR-Modifier  10/27/2020   Zoster Vaccines- Shingrix (1 of 2) 05/10/2022 (Originally 12/13/1989)   COVID-19 Vaccine (1) 02/25/2024 (Originally 06/14/1971)   MAMMOGRAM  04/29/2022   TETANUS/TDAP  03/11/2030   COLONOSCOPY (Pts 45-32yr Insurance coverage will need to be confirmed)  07/20/2030   INFLUENZA VACCINE  Completed   HIV Screening  Completed   HPV VACCINES  Aged Out       Assessment  This is a routine wellness examination for KEli Lilly and Gomez  Health Maintenance: Due or Overdue Health Maintenance Due  Topic Date Due   Hepatitis C Screening  Never done   Pneumococcal Vaccine 163658Years old (2 - PCV) 03/06/2020   PAP SMEAR-Modifier  10/27/2020    KCarmelina DaneLockard does not need a referral for CCommercial Metals CompanyAssistance: Care Management:   no Social Work:    no Prescription Assistance:  no Nutrition/Diabetes Education:  no   Plan:  Personalized Goals  Goals Addressed               This Visit's Progress     Patient Stated (pt-stated)        Would like to loose 70-80 lbs       Personalized Health Maintenance & Screening Recommendations  Pneumococcal vaccine  Screening Pap smear and pelvic exam  Shingrix  Mammogram- scheduled for 05/04/21.  Lung Cancer Screening Recommended: no (Low Dose CT Chest recommended if Age 52-80years, 30 pack-year currently smoking OR have quit w/in past 15 years) Hepatitis C Screening  recommended: yes HIV Screening recommended: no  Advanced Directives: Written information was not prepared per patient's request.  Referrals & Orders No orders of the defined types were placed in this encounter.   Follow-up Plan Follow-up with MHali Marry MD as planned Schedule your shingrix vaccine at your pharmacy.  Schedule your pap smear appointment with your gyn.  Discuss pneumonia vaccine with Dr. MMadilyn Firemanat your next appointment.  Medicare wellness visit in one year.  Patient will access AVS on mychart.   I have personally reviewed and noted the following in the patients chart:   Medical and social history Use of alcohol, tobacco or illicit drugs  Current medications and supplements Functional ability and status Nutritional status Physical activity Advanced directives List of other physicians Hospitalizations, surgeries, and ER visits in previous 12 months Vitals Screenings to include cognitive, depression, and falls Referrals and appointments  In addition, I have reviewed and discussed with KVerita Schneiderscertain preventive protocols, quality metrics, and best practice recommendations. A written personalized care plan for preventive services as well as general preventive health recommendations is available and can be mailed to the patient at her request.      BTinnie Gens RN  04/29/2021

## 2021-05-02 ENCOUNTER — Other Ambulatory Visit: Payer: Self-pay | Admitting: Family Medicine

## 2021-05-02 DIAGNOSIS — Z79899 Other long term (current) drug therapy: Secondary | ICD-10-CM | POA: Diagnosis not present

## 2021-05-02 DIAGNOSIS — G8929 Other chronic pain: Secondary | ICD-10-CM | POA: Diagnosis not present

## 2021-05-02 DIAGNOSIS — Z79891 Long term (current) use of opiate analgesic: Secondary | ICD-10-CM | POA: Diagnosis not present

## 2021-05-02 DIAGNOSIS — M545 Low back pain, unspecified: Secondary | ICD-10-CM | POA: Diagnosis not present

## 2021-05-04 ENCOUNTER — Ambulatory Visit (INDEPENDENT_AMBULATORY_CARE_PROVIDER_SITE_OTHER): Payer: Medicare Other

## 2021-05-04 ENCOUNTER — Other Ambulatory Visit: Payer: Self-pay

## 2021-05-04 DIAGNOSIS — Z1231 Encounter for screening mammogram for malignant neoplasm of breast: Secondary | ICD-10-CM

## 2021-05-04 NOTE — Progress Notes (Signed)
Please call patient. Normal mammogram.  Repeat in 1 year.  

## 2021-05-05 ENCOUNTER — Ambulatory Visit: Payer: Medicare Other | Admitting: Family Medicine

## 2021-05-06 ENCOUNTER — Ambulatory Visit (HOSPITAL_COMMUNITY): Payer: Medicare Other | Admitting: Licensed Clinical Social Worker

## 2021-05-10 ENCOUNTER — Telehealth: Payer: Self-pay

## 2021-05-10 NOTE — Telephone Encounter (Signed)
Medication: Rimegepant Sulfate (NURTEC) 75 MG TBDP Prior authorization submitted via CoverMyMeds on 05/10/2021 PA submission pending

## 2021-05-18 ENCOUNTER — Encounter: Payer: Self-pay | Admitting: Family Medicine

## 2021-05-18 NOTE — Telephone Encounter (Signed)
PA already on file that is good from 08/09/20-05/14/21 Pt has been changed from Nurtec to [employer]

## 2021-05-24 ENCOUNTER — Ambulatory Visit (HOSPITAL_COMMUNITY): Payer: Medicare Other | Admitting: Licensed Clinical Social Worker

## 2021-05-29 DIAGNOSIS — R0789 Other chest pain: Secondary | ICD-10-CM | POA: Diagnosis not present

## 2021-05-29 DIAGNOSIS — I10 Essential (primary) hypertension: Secondary | ICD-10-CM | POA: Diagnosis not present

## 2021-05-29 DIAGNOSIS — R079 Chest pain, unspecified: Secondary | ICD-10-CM | POA: Diagnosis not present

## 2021-05-29 DIAGNOSIS — Z881 Allergy status to other antibiotic agents status: Secondary | ICD-10-CM | POA: Diagnosis not present

## 2021-05-29 DIAGNOSIS — E876 Hypokalemia: Secondary | ICD-10-CM | POA: Diagnosis not present

## 2021-05-29 DIAGNOSIS — I701 Atherosclerosis of renal artery: Secondary | ICD-10-CM | POA: Diagnosis not present

## 2021-05-29 DIAGNOSIS — I700 Atherosclerosis of aorta: Secondary | ICD-10-CM | POA: Diagnosis not present

## 2021-05-29 DIAGNOSIS — J8410 Pulmonary fibrosis, unspecified: Secondary | ICD-10-CM | POA: Diagnosis not present

## 2021-05-29 DIAGNOSIS — K219 Gastro-esophageal reflux disease without esophagitis: Secondary | ICD-10-CM | POA: Diagnosis not present

## 2021-05-29 DIAGNOSIS — Z9102 Food additives allergy status: Secondary | ICD-10-CM | POA: Diagnosis not present

## 2021-05-29 DIAGNOSIS — D3502 Benign neoplasm of left adrenal gland: Secondary | ICD-10-CM | POA: Diagnosis not present

## 2021-05-29 DIAGNOSIS — Z888 Allergy status to other drugs, medicaments and biological substances status: Secondary | ICD-10-CM | POA: Diagnosis not present

## 2021-05-29 DIAGNOSIS — Z882 Allergy status to sulfonamides status: Secondary | ICD-10-CM | POA: Diagnosis not present

## 2021-05-29 DIAGNOSIS — Z8673 Personal history of transient ischemic attack (TIA), and cerebral infarction without residual deficits: Secondary | ICD-10-CM | POA: Diagnosis not present

## 2021-05-29 DIAGNOSIS — F17210 Nicotine dependence, cigarettes, uncomplicated: Secondary | ICD-10-CM | POA: Diagnosis not present

## 2021-05-29 DIAGNOSIS — Z79899 Other long term (current) drug therapy: Secondary | ICD-10-CM | POA: Diagnosis not present

## 2021-05-29 DIAGNOSIS — K449 Diaphragmatic hernia without obstruction or gangrene: Secondary | ICD-10-CM | POA: Diagnosis not present

## 2021-05-29 DIAGNOSIS — Z86718 Personal history of other venous thrombosis and embolism: Secondary | ICD-10-CM | POA: Diagnosis not present

## 2021-05-29 DIAGNOSIS — I708 Atherosclerosis of other arteries: Secondary | ICD-10-CM | POA: Diagnosis not present

## 2021-05-29 DIAGNOSIS — Z885 Allergy status to narcotic agent status: Secondary | ICD-10-CM | POA: Diagnosis not present

## 2021-05-30 ENCOUNTER — Other Ambulatory Visit: Payer: Self-pay

## 2021-05-30 ENCOUNTER — Ambulatory Visit (INDEPENDENT_AMBULATORY_CARE_PROVIDER_SITE_OTHER): Payer: Medicare Other | Admitting: Family Medicine

## 2021-05-30 ENCOUNTER — Encounter: Payer: Self-pay | Admitting: Family Medicine

## 2021-05-30 VITALS — BP 138/70 | HR 85 | Resp 18 | Ht 66.0 in | Wt 295.0 lb

## 2021-05-30 DIAGNOSIS — R7301 Impaired fasting glucose: Secondary | ICD-10-CM

## 2021-05-30 DIAGNOSIS — Z6841 Body Mass Index (BMI) 40.0 and over, adult: Secondary | ICD-10-CM

## 2021-05-30 DIAGNOSIS — R7989 Other specified abnormal findings of blood chemistry: Secondary | ICD-10-CM | POA: Diagnosis not present

## 2021-05-30 DIAGNOSIS — E876 Hypokalemia: Secondary | ICD-10-CM | POA: Diagnosis not present

## 2021-05-30 DIAGNOSIS — R5383 Other fatigue: Secondary | ICD-10-CM

## 2021-05-30 NOTE — Assessment & Plan Note (Signed)
You for A1c.  We will get up-to-date labs.

## 2021-05-30 NOTE — Assessment & Plan Note (Signed)
BMI 47 today.  I do support her efforts to have weight loss surgery.  Completed the paperwork for that support.  Please see scanned document.  We will attach med list and problem list.  Can contact us if they need any further assistance.  She is hopeful that they will get her scheduled sometime in May.

## 2021-05-30 NOTE — Progress Notes (Signed)
Established Patient Office Visit  Subjective:  Patient ID: Ashley Gomez, female    DOB: 1970-08-06  Age: 51 y.o. MRN: [id number]  CC:  Chief Complaint  Patient presents with   Surgical Clearance Form     Bariatric Surgery Support Letter    Discuss ED Visit    Patient seen in ED on 05/29/21. Patient would like to know if she should continue taking Potassium and Sulcarafate.     HPI Ashley Gomez presents for recent emergency department visit follow-up.  She was also seen for chest pain  in the ED yesterday in the at [place].  It started at 4 AM in the morning.  She had negative cardiac enzymes.  No anemia.  Potassium was a little bit low at 3.2 so they did give her prescription for potassium.  CT abdomen pelvis showed no evidence of aortic dissection no acute intrathoracic disease no acute findings.  Stable left adrenal adenoma.  Small hiatal hernia.  She was also given a prescription for sucralfate   She would also like to be checked for vitamin D and B deficiencies.  She has been feeling really rundown and tired lately.  She does have a prior history of vitamin D deficiency.  She is also working with the bariatric clinic and has an appoint with the surgeon later this week.  They had asked that we write a letter of support and she brought in a template today.  We will get additional information to complete the form.  In the past she has tried [employer], [employer], [employer].  She has tried medication such as phentermine, Topamax, Victoza.  In regards to exercise she has gone to the gym at [employer], she walks at her local park she is done formal physical therapy.  Past Medical History:  Diagnosis Date   Anxiety    Arthritis    CVA (cerebral infarction)    Old CVA on MRI brain   Depression    DVT (deep venous thrombosis) ([place]) 01/60/1093   L basilic vein    Facet hypertrophy of lumbar region    MRI 2007   Fibromyalgia    GERD (gastroesophageal reflux disease)     Hypertension    Migraines    MS (multiple sclerosis) (HCC)    Neuromuscular disorder (HCC)    Obesity    Pre-diabetes    PTSD (post-traumatic stress disorder)    Seizures ([place]) 2011   no seizure since onset   Smoking    Stroke [facility])    TIA, >8years ago    Past Surgical History:  Procedure Laterality Date   CHOLECYSTECTOMY  10/2017   CHONDROPLASTY Left 12/30/2014   Procedure: CHONDROPLASTY;  Surgeon: Leandrew Koyanagi, MD;  Location: [place];  Service: Orthopedics;  Laterality: Left;   KNEE ARTHROSCOPY Left 12/30/2014   Procedure: LEFT KNEE ARTHROSCOPY WITH   CHONDROPLASTY;  Surgeon: Leandrew Koyanagi, MD;  Location: [place];  Service: Orthopedics;  Laterality: Left;   PARTIAL KNEE ARTHROPLASTY Left 08/03/2016   Procedure: LEFT UNICOMPARTMENTAL KNEE ARTHROPLASTY;  Surgeon: Leandrew Koyanagi, MD;  Location: [place];  Service: Orthopedics;  Laterality: Left;   SHOULDER ARTHROSCOPY WITH BICEPSTENOTOMY Left 01/15/2020   Procedure: SHOULDER ARTHROSCOPY WITH BICEPSTENOTOMY;  Surgeon: Hiram Gash, MD;  Location: [place];  Service: Orthopedics;  Laterality: Left;   SHOULDER ARTHROSCOPY WITH DISTAL CLAVICLE RESECTION Left 01/15/2020   Procedure: SHOULDER ARTHROSCOPY WITH DISTAL CLAVICULECTOMY;  Surgeon: Hiram Gash, MD;  Location: [place];  Service: Orthopedics;  Laterality: Left;   SHOULDER ARTHROSCOPY WITH ROTATOR CUFF REPAIR AND SUBACROMIAL DECOMPRESSION Left 01/15/2020   Procedure: SHOULDER ARTHROSCOPY DEBRIDMENT WITH ROTATOR CUFF REPAIR, SUBACROMIAL DECOMPRESSION WITH ACROMIPLASTY, AND BICEP TENOTOMY;  Surgeon: Hiram Gash, MD;  Location: [place];  Service: Orthopedics;  Laterality: Left;   TONSILLECTOMY     TUBAL LIGATION  1992    Family History  Problem Relation Age of Onset   Cancer Mother 52       melanoma   Hypertension Sister    Heart disease Sister        valve disease   Depression Sister    Diabetes  Sister    Sjogren's syndrome Sister     Social History   Socioeconomic History   Marital status: Divorced    Spouse name: Not on file   Number of children: 3   Years of education: 12   Highest education level: GED or equivalent  Occupational History   Occupation: Legally disabled  Tobacco Use   Smoking status: Every Day    Packs/day: 1.00    Years: 25.00    Pack years: 25.00    Types: Cigarettes   Smokeless tobacco: Never   Tobacco comments:    Patient currently smoking 2-3 cigarettes a day   Vaping Use   Vaping Use: Never used  Substance and Sexual Activity   Alcohol use: No    Alcohol/week: 0.0 standard drinks   Drug use: No   Sexual activity: Yes    Birth control/protection: None, Post-menopausal  Other Topics Concern   Not on file  Social History Narrative   Her son lives with her after a car accident that he was involved in. She had three children, one has deceased. She enjoys reading, watching television and spending time with her children and grandchildren.   Social Determinants of Health   Financial Resource Strain: Low Risk    Difficulty of Paying Living Expenses: Not hard at all  Food Insecurity: No Food Insecurity   Worried About Charity fundraiser in the Last Year: Never true   [place] in the Last Year: Never true  Transportation Needs: No Transportation Needs   Lack of Transportation (Medical): No   Lack of Transportation (Non-Medical): No  Physical Activity: Inactive   Days of Exercise per Week: 0 days   Minutes of Exercise per Session: 0 min  Stress: Stress Concern Present   Feeling of Stress : Rather much  Social Connections: Socially Isolated   Frequency of Communication with Friends and Family: More than three times a week   Frequency of Social Gatherings with Friends and Family: Once a week   Attends Religious Services: Never   Marine scientist or Organizations: No   Attends Music therapist: Never   Marital  Status: Divorced  Human resources officer Violence: Not At Risk   Fear of Current or Ex-Partner: No   Emotionally Abused: No   Physically Abused: No   Sexually Abused: No    Outpatient Medications Prior to Visit  Medication Sig Dispense Refill   acetaminophen (TYLENOL) 500 MG tablet Take by mouth.     amLODipine (NORVASC) 2.5 MG tablet TAKE 1 TABLET BY MOUTH EVERY DAY 90 tablet 1   ammonium lactate (LAC-HYDRIN) 12 % lotion See admin instructions.     Atogepant (QULIPTA) 10 MG TABS Take 10 mg by mouth daily. [place]  tablet 1   atorvastatin (LIPITOR) 20 MG tablet Take by mouth.     azelastine (ASTELIN) 0.1 % nasal spray As needed     BD PEN NEEDLE NANO 2ND GEN 32G X 4 MM MISC      busPIRone (BUSPAR) 7.5 MG tablet Take 1 tablet (7.5 mg total) by mouth 2 (two) times daily as needed. 180 tablet 0   chlorthalidone (HYGROTON) 25 MG tablet Take by mouth.     clindamycin (CLEOCIN T) 1 % external solution Apply topically.     clobetasol cream (TEMOVATE) 0.05 % Apply topically.     cyclobenzaprine (FLEXERIL) 10 MG tablet Take by mouth.     diazepam (VALIUM) 5 MG tablet TAKE 1 TABLET BY MOUTH DAILY AS NEEDED FOR ANXIETY. 30 tablet 1   DULoxetine (CYMBALTA) 30 MG capsule YAKE WITH 60MG . TOTAL DAILY DOSE IS  90 MG 90 capsule 0   DULoxetine (CYMBALTA) 60 MG capsule Take 1 capsule (60 mg total) by mouth daily. 90 capsule 0   famotidine (PEPCID) 20 MG tablet TAKE 1 TABLET BY MOUTH EVERY DAY 90 tablet 3   HYDROcodone-acetaminophen (NORCO) 10-325 MG tablet Take 1 tablet by mouth every 6 (six) hours as needed for pain. for pain  0   KLOR-CON M20 20 MEQ tablet Take 20 mEq by mouth 2 (two) times daily.     levocetirizine (XYZAL) 5 MG tablet Take 5 mg by mouth daily.     metoprolol succinate (TOPROL-XL) 25 MG 24 hr tablet TAKE 1 TABLET BY MOUTH EVERY DAY 90 tablet 1   mupirocin ointment (BACTROBAN) 2 % SMARTSIG:1 Application Topical 2-3 Times Daily     NARCAN 4 MG/0.1ML LIQD nasal spray kit USE AS DIRECTED     NON  FORMULARY [facility]  Circulation-#5     omeprazole (PRILOSEC) 40 MG capsule TAKE 1 CAPSULE BY MOUTH EVERY DAY 90 capsule 3   ondansetron (ZOFRAN-ODT) 8 MG disintegrating tablet Take 8 mg by mouth every 8 (eight) hours as needed.     sucralfate (CARAFATE) 1 GM/10ML suspension SMARTSIG:Milliliter(s) By Mouth     VICTOZA 18 MG/3ML SOPN Inject 1.8 mg into the skin daily.  1   busPIRone (BUSPAR) 7.5 MG tablet Take by mouth.     ondansetron (ZOFRAN) 4 MG tablet TAKE 1 TABLET EVERY 8 HOURS AS NEEDED FOR NAUSEA AND VOMITING 10 tablet 1   diazepam (VALIUM) 5 MG tablet Take by mouth.     Tapentadol HCl (NUCYNTA ER) 150 MG TB12 Take 1 tablet by mouth in the morning and at bedtime.     No facility-administered medications prior to visit.    Allergies  Allergen Reactions   Celebrex [Celecoxib] Other (See Comments)    Swelling in extremities   Phenytoin Swelling   Topamax [Topiramate] Other (See Comments)    She reports that this makes her forgetful and causes her to studder   Nucynta [Tapentadol] Other (See Comments)    Headaches, abdominal pain    Ace Inhibitors Cough   Baclofen Nausea Only   Celexa [Citalopram Hydrobromide] Other (See Comments)    Dizziness Tolerated Lexapro    Codeine Nausea Only   Methocarbamol Nausea Only   Sumatriptan Nausea Only   Tizanidine Other (See Comments)    Keeps her awake.    Tramadol Anxiety and Palpitations   Triamcinolone Other (See Comments)    Steroid flare after joint injection, use other steroid for injections    ROS Review of Systems    Objective:  Physical Exam Constitutional:      Appearance: Normal appearance. She is well-developed.  HENT:     Head: Normocephalic and atraumatic.  Cardiovascular:     Rate and Rhythm: Normal rate and regular rhythm.     Heart sounds: Normal heart sounds.  Pulmonary:     Effort: Pulmonary effort is normal.     Breath sounds: Normal breath sounds.  Skin:    General: Skin is warm and dry.   Neurological:     Mental Status: She is alert and oriented to person, place, and time.  Psychiatric:        Behavior: Behavior normal.    BP 138/70 (BP Location: Left Arm)    Pulse 85    Resp 18    Ht _0  (1.676 m)    Wt 295 lb (133.8 kg)    LMP 12/28/2014    SpO2 97%    BMI 47.61 kg/m  Wt Readings from Last 3 Encounters:  05/30/21 295 lb (133.8 kg)  02/08/21 285 lb (129.3 kg)  11/08/20 293 lb (132.9 kg)     Health Maintenance Due  Topic Date Due   PAP SMEAR-Modifier  10/27/2020    There are no preventive care reminders to display for this patient.  Lab Results  Component Value Date   TSH 1.53 08/09/2020   Lab Results  Component Value Date   WBC 10.5 02/08/2021   HGB 15.0 02/08/2021   HCT 46.5 (H) 02/08/2021   MCV 83.9 02/08/2021   PLT 309 02/08/2021   Lab Results  Component Value Date   NA 138 02/08/2021   K 3.4 (L) 02/08/2021   CO2 29 02/08/2021   GLUCOSE 109 02/08/2021   BUN 15 02/08/2021   CREATININE 0.79 02/08/2021   BILITOT 0.4 02/08/2021   ALKPHOS 98 11/29/2016   AST 18 02/08/2021   ALT 12 02/08/2021   PROT 6.8 02/08/2021   ALBUMIN 3.6 11/29/2016   CALCIUM 9.4 02/08/2021   ANIONGAP 10 01/12/2020   EGFR 90 02/08/2021   Lab Results  Component Value Date   CHOL 207 (H) 08/09/2020   Lab Results  Component Value Date   HDL 33 (L) 08/09/2020   Lab Results  Component Value Date   [facility]  08/09/2020     Comment:     . LDL cholesterol not calculated. Triglyceride levels greater than 400 mg/dL invalidate calculated LDL results. . Reference range: <100 . Desirable range <100 mg/dL for primary prevention;   <70 mg/dL for patients with CHD or diabetic patients  with > or = 2 CHD risk factors. Marland Kitchen LDL-C is now calculated using the Martin-Hopkins  calculation, which is a validated novel method providing  better accuracy than the Friedewald equation in the  estimation of LDL-C.  Cresenciano Genre et al. Annamaria Helling. 2229;798(92): 2061-2068   (http://education.[facility].com/faq/FAQ164)    Lab Results  Component Value Date   TRIG 422 (H) 08/09/2020   Lab Results  Component Value Date   CHOLHDL 6.3 (H) 08/09/2020   Lab Results  Component Value Date   HGBA1C 6.2 (A) 02/08/2021      Assessment & Plan:   Problem List Items Addressed This Visit       Endocrine   IFG (impaired fasting glucose)    You for A1c.  We will get up-to-date labs.      Relevant Orders   Hemoglobin A1c     Other   BMI 45.0-49.9, adult (HCC)    BMI 47 today.  I do support  her efforts to have weight loss surgery.  Completed the paperwork for that support.  Please see scanned document.  We will attach med list and problem list.  Can contact us if they need any further assistance.  She is hopeful that they will get her scheduled sometime in May.      Other Visit Diagnoses     Hypokalemia    -  Primary   Relevant Orders   CBC   TSH   Fe+TIBC+Fer   VITAMIN D 25 Hydroxy (Vit-D Deficiency, Fractures)   Vitamin B1   Vitamin B6   B12   Other fatigue       Relevant Orders   CBC   TSH   Fe+TIBC+Fer   VITAMIN D 25 Hydroxy (Vit-D Deficiency, Fractures)   Vitamin B1   Vitamin B6   B12   Low vitamin D level       Relevant Orders   VITAMIN D 25 Hydroxy (Vit-D Deficiency, Fractures)       Hypokalemia-finish out the potassium supplement given for 5 days and sometime next week return to have potassium rechecked so that we can make sure that it is stable.  Fatigue-we will check some additional labs for deficiencies and for thyroid disorder.  No orders of the defined types were placed in this encounter.   Follow-up: Return in about 4 months (around 09/27/2021) for Hypertension.    Beatrice Lecher, MD

## 2021-06-02 DIAGNOSIS — M5136 Other intervertebral disc degeneration, lumbar region: Secondary | ICD-10-CM | POA: Diagnosis not present

## 2021-06-02 DIAGNOSIS — K219 Gastro-esophageal reflux disease without esophagitis: Secondary | ICD-10-CM | POA: Diagnosis not present

## 2021-06-02 DIAGNOSIS — E785 Hyperlipidemia, unspecified: Secondary | ICD-10-CM | POA: Diagnosis not present

## 2021-06-02 DIAGNOSIS — I10 Essential (primary) hypertension: Secondary | ICD-10-CM | POA: Diagnosis not present

## 2021-06-02 DIAGNOSIS — G35 Multiple sclerosis: Secondary | ICD-10-CM | POA: Diagnosis not present

## 2021-06-02 DIAGNOSIS — M069 Rheumatoid arthritis, unspecified: Secondary | ICD-10-CM | POA: Diagnosis not present

## 2021-06-02 DIAGNOSIS — F17200 Nicotine dependence, unspecified, uncomplicated: Secondary | ICD-10-CM | POA: Diagnosis not present

## 2021-06-02 DIAGNOSIS — E538 Deficiency of other specified B group vitamins: Secondary | ICD-10-CM | POA: Diagnosis not present

## 2021-06-02 DIAGNOSIS — M859 Disorder of bone density and structure, unspecified: Secondary | ICD-10-CM | POA: Diagnosis not present

## 2021-06-02 DIAGNOSIS — K760 Fatty (change of) liver, not elsewhere classified: Secondary | ICD-10-CM | POA: Diagnosis not present

## 2021-06-02 DIAGNOSIS — R799 Abnormal finding of blood chemistry, unspecified: Secondary | ICD-10-CM | POA: Diagnosis not present

## 2021-06-02 DIAGNOSIS — I700 Atherosclerosis of aorta: Secondary | ICD-10-CM | POA: Diagnosis not present

## 2021-06-03 DIAGNOSIS — G35 Multiple sclerosis: Secondary | ICD-10-CM | POA: Diagnosis not present

## 2021-06-03 DIAGNOSIS — G8929 Other chronic pain: Secondary | ICD-10-CM | POA: Diagnosis not present

## 2021-06-03 DIAGNOSIS — M545 Low back pain, unspecified: Secondary | ICD-10-CM | POA: Diagnosis not present

## 2021-06-03 DIAGNOSIS — Z79899 Other long term (current) drug therapy: Secondary | ICD-10-CM | POA: Diagnosis not present

## 2021-06-08 DIAGNOSIS — Z79899 Other long term (current) drug therapy: Secondary | ICD-10-CM | POA: Diagnosis not present

## 2021-06-10 ENCOUNTER — Encounter: Payer: Self-pay | Admitting: Sports Medicine

## 2021-06-10 DIAGNOSIS — R0683 Snoring: Secondary | ICD-10-CM | POA: Diagnosis not present

## 2021-06-10 DIAGNOSIS — I10 Essential (primary) hypertension: Secondary | ICD-10-CM | POA: Diagnosis not present

## 2021-06-10 DIAGNOSIS — R5383 Other fatigue: Secondary | ICD-10-CM | POA: Diagnosis not present

## 2021-06-13 DIAGNOSIS — I10 Essential (primary) hypertension: Secondary | ICD-10-CM | POA: Diagnosis not present

## 2021-06-13 DIAGNOSIS — E611 Iron deficiency: Secondary | ICD-10-CM | POA: Diagnosis not present

## 2021-06-14 ENCOUNTER — Ambulatory Visit (INDEPENDENT_AMBULATORY_CARE_PROVIDER_SITE_OTHER): Payer: Medicare Other | Admitting: Licensed Clinical Social Worker

## 2021-06-14 ENCOUNTER — Other Ambulatory Visit (HOSPITAL_COMMUNITY): Payer: Self-pay

## 2021-06-14 DIAGNOSIS — E119 Type 2 diabetes mellitus without complications: Secondary | ICD-10-CM | POA: Diagnosis not present

## 2021-06-14 DIAGNOSIS — F411 Generalized anxiety disorder: Secondary | ICD-10-CM | POA: Diagnosis not present

## 2021-06-14 DIAGNOSIS — F4310 Post-traumatic stress disorder, unspecified: Secondary | ICD-10-CM | POA: Diagnosis not present

## 2021-06-14 DIAGNOSIS — F4321 Adjustment disorder with depressed mood: Secondary | ICD-10-CM | POA: Diagnosis not present

## 2021-06-14 DIAGNOSIS — F331 Major depressive disorder, recurrent, moderate: Secondary | ICD-10-CM

## 2021-06-14 DIAGNOSIS — G894 Chronic pain syndrome: Secondary | ICD-10-CM

## 2021-06-14 DIAGNOSIS — I10 Essential (primary) hypertension: Secondary | ICD-10-CM | POA: Diagnosis not present

## 2021-06-14 MED ORDER — BUSPIRONE HCL 7.5 MG PO TABS: 7.5000 mg | ORAL_TABLET | Freq: Two times a day (BID) | ORAL | 0 refills | Status: DC | PRN

## 2021-06-14 MED ORDER — DULOXETINE HCL 60 MG PO CPEP: 60.0000 mg | ORAL_CAPSULE | Freq: Every day | ORAL | 0 refills | Status: DC

## 2021-06-14 MED ORDER — DULOXETINE HCL 30 MG PO CPEP: ORAL_CAPSULE | ORAL | 0 refills | Status: DC

## 2021-06-14 NOTE — Progress Notes (Signed)
Virtual Visit via Telephone Note  I connected with Ashley Gomez on 06/14/21 at 11:00 AM EST by telephone and verified that I am speaking with the correct person using two identifiers.  Location: Patient: home Provider: office   I discussed the limitations, risks, security and privacy concerns of performing an evaluation and management service by telephone and the availability of in person appointments. I also discussed with the patient that there may be a patient responsible charge related to this service. The patient expressed understanding and agreed to proceed.    I discussed the assessment and treatment plan with the patient. The patient was provided an opportunity to ask questions and all were answered. The patient agreed with the plan and demonstrated an understanding of the instructions.   The patient was advised to call back or seek an in-person evaluation if the symptoms worsen or if the condition fails to improve as anticipated.  I provided 52 minutes of non-face-to-face time during this encounter.  THERAPIST PROGRESS NOTE  Session Time: 11:00 AM to 11:52 AM  Participation Level: Active  Behavioral Response: CasualAlertAnxious  Type of Therapy: Individual Therapy  Treatment Goals addressed:  stress management and anxiety but continue to work on depression, supportive interventions, trauma, grief, coping Interventions: Solution Focused, Strength-based, Supportive, and Other: coping  Summary: Ashley Gomez is a 51 y.o. female who presents with so much going on cousin found out she had bladder cancer passed away 11-Jun-2022. Kids aunt, ex sister-in-law, kid's dad's sister passed unexpectedly. 1/3. She would have been 57. Patient was close to both of them and did not go to funerals but in particular just could not go to cousin's because aunt going through what she went through somebody else finally understands and aunt even said that. Patient doesn't call sends a message at least  once a week. Trying to be a support is a trigger why don't think went to  funerals brings back a lot of memories. It is protective reaction. Has such mixed emotions about it. Knows how things went for her afterwards. If need patient let her know that she is there. Patient has had the little ones this past month until this past Wednesday. Other grandmother having a hard time still losing [name]. When she is having a bad day wants to get the kids. When talk to them acts like too much and out of breath. Understands why want them they were awesome when here and really had a good time. After last visit with therapist came down with a stomach virus and had 8 days. Sleep study consultation last Friday. Spent time with sister during holidays for a week and "I needed it". Weight loss surgery met the surgeon found out why tired iron almost non-existent. Have to have separate IV infusions. Potassium low, Vitamin D low. Like what happened to her 7 years all vitamin in system were depleted and got sick doesn't want that to happen again. Conflicted about weight loss surgery. Does because want to lose weight but doesn't want complications and make things worse than have been. Issue hurt so can't exercise. Has other options going through process of what has to do for weight loss. Has to quit smoking which is a good thing. Did a nicotine test and failed, doing this with surgeon for wait loss. Just want her to quit. Trying to cut back. Thinks breath will be better and food taste better. Thinks can quit on her own. Cost have really gone up. If everything works out  surgery would be done by May. Mark at home not working not motivated. Patient a little more stressed, doctor's appointments thinks if get decent rest would feel better. Doesn't get to sleep in the morning 2-3 in morning mind won't shut down. Lot of anxiety so take 1/2 Valium to calm down a little thinks so much going on over the last month. Says she needs to sit back and  chill for awhile. Put her mind at ease, make sure everyone ok and worry about her. Worry about everything can't shut mind off.  Discussed working on treatment strategies for generalized anxiety.  Therapist reviewed symptoms, facilitated expression of thoughts and feelings utilizing this as a treatment intervention to help cope and process feelings.  Therapist provided support and space to patient as she shared her thoughts and feelings in session.  In particular worked on trigger of losses validating patient about how they brought up around issues in the same time noting patient taking care of herself by keeping distance to help her in regulating her emotions and noting this is a good coping skill.  Noted patient Constantly worries so needing to focus specifically on generalized anxiety strategies and therapist will start to send patient packet to help her learn skills and we can discuss in therapy.  Noted patient's own strategy of as she describes feeling and self care helpful in the process along with learning some coping skills.  Assess feelings related to other stressors that play partner anxiety including decisions about procedures, will be working on not smoking.  Noted this is positive step in working on issues in therapy for patient to stop smoking and positive impact will have on her life is provided active listening open questions to process through issues and work on coping.  Suicidal/Homicidal: No  Plan: Return again in 3 weeks.2.  Send some chapters for packet and generalized anxiety, work on stress management mood regulation  Diagnosis: Axis I: generalized anxiety disorder PTSD, complicated grief, chronic pain syndrome, Major depressive disorder, recurrent moderate     Axis II: No diagnosis    Cordella Register, LCSW 06/14/2021

## 2021-06-16 ENCOUNTER — Other Ambulatory Visit: Payer: Self-pay | Admitting: Family Medicine

## 2021-06-17 ENCOUNTER — Other Ambulatory Visit: Payer: Self-pay | Admitting: Family Medicine

## 2021-06-27 DIAGNOSIS — D509 Iron deficiency anemia, unspecified: Secondary | ICD-10-CM | POA: Diagnosis not present

## 2021-06-29 DIAGNOSIS — G4733 Obstructive sleep apnea (adult) (pediatric): Secondary | ICD-10-CM | POA: Diagnosis not present

## 2021-06-30 ENCOUNTER — Ambulatory Visit (HOSPITAL_COMMUNITY): Payer: Medicare Other | Admitting: Psychiatry

## 2021-07-01 DIAGNOSIS — Z79891 Long term (current) use of opiate analgesic: Secondary | ICD-10-CM | POA: Diagnosis not present

## 2021-07-01 DIAGNOSIS — G8929 Other chronic pain: Secondary | ICD-10-CM | POA: Diagnosis not present

## 2021-07-01 DIAGNOSIS — M545 Low back pain, unspecified: Secondary | ICD-10-CM | POA: Diagnosis not present

## 2021-07-01 DIAGNOSIS — Z79899 Other long term (current) drug therapy: Secondary | ICD-10-CM | POA: Diagnosis not present

## 2021-07-04 ENCOUNTER — Ambulatory Visit (HOSPITAL_COMMUNITY): Payer: Medicare Other | Admitting: Psychiatry

## 2021-07-04 DIAGNOSIS — D509 Iron deficiency anemia, unspecified: Secondary | ICD-10-CM | POA: Diagnosis not present

## 2021-07-07 ENCOUNTER — Ambulatory Visit (INDEPENDENT_AMBULATORY_CARE_PROVIDER_SITE_OTHER): Payer: Medicare Other | Admitting: Licensed Clinical Social Worker

## 2021-07-07 DIAGNOSIS — F4310 Post-traumatic stress disorder, unspecified: Secondary | ICD-10-CM

## 2021-07-07 DIAGNOSIS — F4321 Adjustment disorder with depressed mood: Secondary | ICD-10-CM

## 2021-07-07 DIAGNOSIS — F411 Generalized anxiety disorder: Secondary | ICD-10-CM

## 2021-07-07 DIAGNOSIS — F331 Major depressive disorder, recurrent, moderate: Secondary | ICD-10-CM | POA: Diagnosis not present

## 2021-07-07 DIAGNOSIS — G894 Chronic pain syndrome: Secondary | ICD-10-CM

## 2021-07-07 NOTE — Progress Notes (Signed)
Virtual Visit via Telephone Note  I connected with [name] on 07/07/21 at 11:00 AM EST by telephone and verified that I am speaking with the correct person using two identifiers.  Location: Patient: home Provider: home office   I discussed the limitations, risks, security and privacy concerns of performing an evaluation and management service by telephone and the availability of in person appointments. I also discussed with the patient that there may be a patient responsible charge related to this service. The patient expressed understanding and agreed to proceed.   I discussed the assessment and treatment plan with the patient. The patient was provided an opportunity to ask questions and all were answered. The patient agreed with the plan and demonstrated an understanding of the instructions.   The patient was advised to call back or seek an in-person evaluation if the symptoms worsen or if the condition fails to improve as anticipated.  I provided 52 minutes of non-face-to-face time during this encounter.  THERAPIST PROGRESS NOTE  Session Time: 11:00 AM to 11:52 AM  Participation Level: Active  Behavioral Response: CasualAlertAngry, Anxious, and appropriate in but patient processed through feelings related to stressor of DNA test  Type of Therapy: Individual Therapy  Treatment Goals addressed: stress management and anxiety but continue to work on depression, supportive interventions, trauma, grief, coping  ProgressTowards Goals: Progressing-patient actively working on issues of anxiety, depression, trauma grief that it helps her make progress on issues as she processes through her feelings and talked about coping strategies that will be helpful  Interventions: Solution Focused, Strength-based, Supportive, Reframing, and Other: Coping  Summary: Ashley Gomez is a 51 y.o. female who presents with picked up grandchildren again only gone three weeks. They needed to come that  is fine love the kids. Baby Ashley Gomez has hand foot mouth disease blisters only bad around the mouth. Patient explains you get that from mucus of somebody that is sick. Before this he had stomach bug throw up and diarrhea. Picked up so sick had a fever he wouldn't talk he wouldn't look at anybody hung his head how bad he felt. Gave him medication for he was a totally different kid.  This have had a fever all around 36 F or 103 F made her mad because other grandmother took her self to the hospital but did not take him with such a high fever. They were excited to see granny, Dad, their father, Ashley Gomez. Other Grandmother went in hospital now can't drive and has to take it easy. [name] in August patient commented about this very smart and something else.  Other big news mom hadn't talked to patient two years since October and that is ok. Their real Dad today supposed to get results through email. She met him last Wednesday he took her to lunch did DNA find out if he is for sure the real Dad. Never know for 50 years. Mom said wasn't and aunts and grandmother said he was. Results there today. Think either way still have a million questions don't know what he looked like when younger don't know anything about him. Questions like why he lied for all these years didn't want anything to do with them? He said their Mom told them that. That is if results are a yes. Patient is 50/50 now on which it will be. Now that is a time and find out for sure if resykts are no "what the hell why won't she tell Ashley Gomez who our dad is" if yes,  why did she say no for all this time. Mom is mad about it, about her getting this test. Second iron infusion Monday. So much going on thinking about weight loss surgery lost 10 pounds in a month. It was the best news. Told Diabetic 2 shot once a week maybe why lost weight but just glad to lose it. Anxious nervous scared and mad since did DNA test. Glad to do it because of health. Know where health issues come  from. If positive doesn't want to set her back regrets didn't know. Even more regrets and animosity toward toward Mom. Big question is why-why mom said that all these years? Therapist pointed out one reason character of mom.  Floored again think she did in the past patient has vivid memories of it that she did not want them, remembers being at  [facility] arguing with a lady take them but would not patient raised by grandparents and aunts. Raised to not hold grudges happy daughter and do for her. The DNA brings up a lot doesn't want to regret that she left it alone and let it be like it was. Even if no [name] says wouldn't walk away from them still want to talk to them and be part of their life. Doesn't know her and children and hurts her can't change but not fair. Questions regardless of how this comes up. Doesn't know how going to handle it. Has to with kids and grandkids. Look at all the years that have gone by. Over think things reviewed what does looking at the past help with we do but also notice brings more pain and can't change.  Discussed loss is thinking about the past as well patient said has put on gravestone for [employer] with baby in it.  Therapist and patient talked about important rituals are.  Patient says doesn't understand as mom and a grandmother how anyone can just brush it off and act like it is no big deal and not be a part of it. When it comes to be with Ashley Gomez is a jealous teenager. Not going to share with anyone. Patient wants him in her life and more than she can say for Mom. Can't hurt her more than have. Take what is given work it out.  May need more help coming sessions based on developments with this test.  Therapist reviewed symptoms facilitated expression of thoughts and feelings therapist provided support and space for patient as she shared her thoughts and feelings in session.  First processed through feelings related to having grandchildren again.  Talking  about some of the issues related to making arrangements with the other grandmother and frustrations that brings.  Talked about doing a DNA test with person she believes that her father but not sure.  Range of emotions and difficult ones around finding out results.  This provided her perspective that looking for the truth is the brave this way to go in the best way for Ashley Gomez to go, at looking for the truth is a way we approach things in the healthiest ways.  Noted patient reviewing how she will react depending on results will be reviewing the past including regrets, questions for her mother.  Therapist pointed out she will have to work through what she needs to work through but at the same time reminding her to remember the past do not have control over, putting energy into something that is not can to change anything so remind yourself to go into  present and future and what she can do with that news currently which may include developing relationship with [name] whether he is her father or not.  Session also gave patient space to talk about is her mom did hard to comprehend how she could have done them and therapist validating her on that.  Was talked about letting go as helpful for Ashley Gomez to the more we can do it the more we will let it tear Ashley Gomez apart.  Denies any anger toward mom that we will need to periodically work through but patient having more distance is protective and helpful for patient.  Noted patient's strength is matriarch of the family and therapist impression over the years noticed her getting stronger as a positive quality.  Patient saying things like and legitimately from her experience would be the case that her mom could hurt her anymore than she already has.  Worked on grief issues with her daughter noting patient putting things on her grave as important to acknowledge this important relationship and to keep it present in her life.  Continue to work with patient on this update finding out results of  DNA will continue at next session as needed.  Therapist provided active listening open questions supportive interventions.          Suicidal/Homicidal: No  Plan: Return again in 4 weeks.2.  Review results of DNA and provide supportive, strength-based interventions as well as helping patient with coping.  Help patient process feelings related 3.  Review packet on anxiety  Diagnosis: No diagnosis found.generalized anxiety disorder PTSD, complicated grief, chronic pain syndrome, Major depressive disorder, recurrent moderate  Collaboration of Care: Other none needed  Patient/Guardian was advised Release of Information must be obtained prior to any record release in order to collaborate their care with an outside provider. Patient/Guardian was advised if they have not already done so to contact the registration department to sign all necessary forms in order for Ashley Gomez to release information regarding their care.   Consent: Patient/Guardian gives verbal consent for treatment and assignment of benefits for services provided during this visit. Patient/Guardian expressed understanding and agreed to proceed.   Cordella Register, [place] 07/07/2021

## 2021-07-08 ENCOUNTER — Telehealth (HOSPITAL_COMMUNITY): Payer: Self-pay | Admitting: Licensed Clinical Social Worker

## 2021-07-08 NOTE — Telephone Encounter (Signed)
Pt called   " the test came back 0% "  Ashley Gomez is in shock

## 2021-07-08 NOTE — Telephone Encounter (Signed)
Don't know how doing. Her sister called Mom and told her not possible have to go back and think 51 years ago to think who is was. How come she has to think about it. So mad also hurt. How to feel yet. When get through feelings have to talk to doctor to doctors about history. Make her more angry.

## 2021-07-14 ENCOUNTER — Ambulatory Visit (HOSPITAL_COMMUNITY): Payer: Medicare Other | Admitting: Psychiatry

## 2021-07-14 ENCOUNTER — Encounter (HOSPITAL_COMMUNITY): Payer: Self-pay

## 2021-07-15 ENCOUNTER — Other Ambulatory Visit: Payer: Self-pay

## 2021-07-15 ENCOUNTER — Encounter: Payer: Self-pay | Admitting: Medical-Surgical

## 2021-07-15 ENCOUNTER — Telehealth (INDEPENDENT_AMBULATORY_CARE_PROVIDER_SITE_OTHER): Payer: Medicare Other | Admitting: Medical-Surgical

## 2021-07-15 DIAGNOSIS — J020 Streptococcal pharyngitis: Secondary | ICD-10-CM

## 2021-07-15 MED ORDER — AMOXICILLIN 500 MG PO CAPS: 500.0000 mg | ORAL_CAPSULE | Freq: Two times a day (BID) | ORAL | 0 refills | Status: AC

## 2021-07-15 NOTE — Progress Notes (Signed)
Virtual Visit via Video Note ? ?I connected with Ashley Gomez on 07/15/21 at  1:00 PM EST by a video enabled telemedicine application and verified that I am speaking with the correct person using two identifiers. ?  ?I discussed the limitations of evaluation and management by telemedicine and the availability of in person appointments. The patient expressed understanding and agreed to proceed. ? ?Patient location: home ?Provider locations: office ? ?Subjective:   ? ?CC: sore throat, fever ? ?HPI: ?Pleasant 51 year old female presenting today via MyChart video visit with reports of fever Tmax 102, sore throat, and thick white exudate that she is spitting up.  She does have a little bit of a cough due to the feeling of secretions in the back of her throat but has no other associated symptoms such as sinus congestion or ear pain.  She does have a bit of a headache.  Has a 3-year-old grandson who has recently been diagnosed with strep pharyngitis as well as bilateral ear infections.  He is currently being treated with amoxicillin.  She is worried that she has also contracted strep.  It has been a long time since she has had any problems like this and wanted to make sure.  Reports that she does have some lymphadenopathy bilaterally in the submandibular region. ? ?Past medical history, Surgical history, Family history not pertinant except as noted below, Social history, Allergies, and medications have been entered into the medical record, reviewed, and corrections made.  ? ?Review of Systems: See HPI for pertinent positives and negatives.  ? ?Objective:   ? ?General: Speaking clearly in complete sentences without any shortness of breath.  Alert and oriented x3.  Normal judgment. No apparent acute distress. ? ?Impression and Recommendations:   ? ?1. Strep pharyngitis ?With her recent exposure and Centor score of 3-4, we will go ahead and treat her empirically with amoxicillin 500 mg twice daily x10 days.  Continue  conservative measures at home.  Offered Diflucan for vulvovaginal candidiasis prophylaxis but she has not had problems with this before.  She will let us know if this becomes an issue. ? ?I discussed the assessment and treatment plan with the patient. The patient was provided an opportunity to ask questions and all were answered. The patient agreed with the plan and demonstrated an understanding of the instructions. ?  ?The patient was advised to call back or seek an in-person evaluation if the symptoms worsen or if the condition fails to improve as anticipated. ? ?20 minutes of non-face-to-face time was provided during this encounter. ? ?Return if symptoms worsen or fail to improve. ? ?Clearnce Sorrel, DNP, APRN, FNP-BC ?[place] [facility] ?

## 2021-07-27 DIAGNOSIS — Z713 Dietary counseling and surveillance: Secondary | ICD-10-CM | POA: Diagnosis not present

## 2021-07-29 DIAGNOSIS — Z79899 Other long term (current) drug therapy: Secondary | ICD-10-CM | POA: Diagnosis not present

## 2021-07-29 DIAGNOSIS — J309 Allergic rhinitis, unspecified: Secondary | ICD-10-CM | POA: Diagnosis not present

## 2021-07-29 DIAGNOSIS — G8929 Other chronic pain: Secondary | ICD-10-CM | POA: Diagnosis not present

## 2021-07-29 DIAGNOSIS — M545 Low back pain, unspecified: Secondary | ICD-10-CM | POA: Diagnosis not present

## 2021-07-29 DIAGNOSIS — R300 Dysuria: Secondary | ICD-10-CM | POA: Diagnosis not present

## 2021-08-01 ENCOUNTER — Ambulatory Visit (HOSPITAL_COMMUNITY): Payer: Medicare Other | Admitting: Licensed Clinical Social Worker

## 2021-08-02 ENCOUNTER — Ambulatory Visit (INDEPENDENT_AMBULATORY_CARE_PROVIDER_SITE_OTHER): Payer: Medicare Other | Admitting: Psychiatry

## 2021-08-02 ENCOUNTER — Encounter (HOSPITAL_COMMUNITY): Payer: Self-pay | Admitting: Psychiatry

## 2021-08-02 VITALS — BP 140/82 | Temp 98.0°F | Ht 66.0 in | Wt 283.0 lb

## 2021-08-02 DIAGNOSIS — G894 Chronic pain syndrome: Secondary | ICD-10-CM | POA: Diagnosis not present

## 2021-08-02 DIAGNOSIS — F5102 Adjustment insomnia: Secondary | ICD-10-CM

## 2021-08-02 DIAGNOSIS — F331 Major depressive disorder, recurrent, moderate: Secondary | ICD-10-CM

## 2021-08-02 DIAGNOSIS — F411 Generalized anxiety disorder: Secondary | ICD-10-CM | POA: Diagnosis not present

## 2021-08-02 MED ORDER — DULOXETINE HCL 30 MG PO CPEP: ORAL_CAPSULE | ORAL | 0 refills | Status: DC

## 2021-08-02 MED ORDER — BUSPIRONE HCL 7.5 MG PO TABS: 7.5000 mg | ORAL_TABLET | Freq: Two times a day (BID) | ORAL | 0 refills | Status: DC | PRN

## 2021-08-02 MED ORDER — DULOXETINE HCL 60 MG PO CPEP: 60.0000 mg | ORAL_CAPSULE | Freq: Every day | ORAL | 0 refills | Status: DC
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
## 2021-08-03 ENCOUNTER — Ambulatory Visit (INDEPENDENT_AMBULATORY_CARE_PROVIDER_SITE_OTHER): Payer: Medicare Other | Admitting: Licensed Clinical Social Worker

## 2021-08-03 DIAGNOSIS — F411 Generalized anxiety disorder: Secondary | ICD-10-CM

## 2021-08-03 DIAGNOSIS — G894 Chronic pain syndrome: Secondary | ICD-10-CM

## 2021-08-03 DIAGNOSIS — F331 Major depressive disorder, recurrent, moderate: Secondary | ICD-10-CM | POA: Diagnosis not present

## 2021-08-03 DIAGNOSIS — F5102 Adjustment insomnia: Secondary | ICD-10-CM | POA: Diagnosis not present

## 2021-08-03 DIAGNOSIS — F4321 Adjustment disorder with depressed mood: Secondary | ICD-10-CM

## 2021-08-03 DIAGNOSIS — F4310 Post-traumatic stress disorder, unspecified: Secondary | ICD-10-CM

## 2021-08-03 NOTE — Progress Notes (Signed)
Virtual Visit via Telephone Note ? ?I connected with Ashley Gomez on 08/03/21 at 10:00 AM EDT by telephone and verified that I am speaking with the correct person using two identifiers. ? ?Location: ?Patient: home ?Provider: home office ?  ?I discussed the limitations, risks, security and privacy concerns of performing an evaluation and management service by telephone and the availability of in person appointments. I also discussed with the patient that there may be a patient responsible charge related to this service. The patient expressed understanding and agreed to proceed. ? ?I discussed the assessment and treatment plan with the patient. The patient was provided an opportunity to ask questions and all were answered. The patient agreed with the plan and demonstrated an understanding of the instructions. ?  ?The patient was advised to call back or seek an in-person evaluation if the symptoms worsen or if the condition fails to improve as anticipated. ? ?I provided 53 minutes of non-face-to-face time during this encounter. ? ?THERAPIST PROGRESS NOTE ? ?Session Time: 10:00 AM to 10:53 AM ? ?Participation Level: Active ? ?Behavioral Response: CasualAlertAnxious and appropriate ? ?Type of Therapy: Individual Therapy ? ?Treatment Goals addressed: stress management and anxiety but continue to work on depression, supportive interventions, trauma, grief, coping ? ?ProgressTowards Goals: Progressing-assess is progressing is actively working on issues patient shared about medical issues and besides processing feelings helpful to be able to share with this therapist for input and support, providing encouragement for progress about things such as losing weight.  Talked as well about trauma specifically the sport she has gotten from group she belongs to how important that is when she does not get the same type of support and most of her relationships since losing Ashley Gomez.  Also talked about getting DNA test  fullness that Ashley Gomez who she thought was father continues to be supportive ? ?Interventions: CBT, Solution Focused, Play Therapy, Supportive, Reframing, and Other: trauma, grief ? ?Summary: Ashley Gomez is a 51 y.o. female who presents with scratchy throat every morning lately think it is allergies. Little ones doing well not with her got them better, baby Ashley Gomez was really sick. Double ear infection sick for two weeks and strep throat. After taken home patient got step throat taking antibiotics  until last week. Then Ashley Gomez got it so made its rounds. Talked about DNA test was shocked. Her sister came down 11th the 12th-17th she was going to Ashley Gomez with aunt. When she came down Ashley Gomez met and had dinner and talked. He said he was surprised but glad he knows now. He was told he was father then it went back and forth. When Ashley Gomez found out (still not talking to her), she found out father was not Ashley Gomez, Ashley Gomez told sister no way told her sister have to think back 72 years. Ashley Gomez said can't believe this and she and Ashley Gomez finally getting along. No apology. To sister but more worried about what Ashley Gomez thinks of her. Patient asks how hard is it to think back who this was? They were worried Ashley Gomez not act the same not  want to see them and have conversations. Not the case when got there same Ashley Gomez. Patient haven't seen Ashley Gomez in three years. Doesn't sleep. Mind races toss and toss and turning next thing 2 in the morning have to get up grand daughter at 5 described trouble with sleeping quiet in the house frustrated.  Dr. Has tried trazodone and hydroxyzine but limited can't give her some things because of  pain meds. Sleep clinic follow up will ask. Maybe tolerance is increased on Dilaudid 3x's a day. More mobile and back doesn't hurt does what it is supposed to dobut doesn't make her sleepy. Didn't get sleep until after alarm went off to get Ashley Gomez up. When need to catch up with sleep can sleep all day and all  night. Take medicine helps her back when get up can hardly walk, left foot hurts especially foot doctor told her to take creme for neuropathy and raynaud disease.  Patient wonders is that it not addressing the issue. Lost 18 lbs in 2 months doing well. Taking a shot for type 2 of Diabetes helping cut down appetite  Why diabetes A1C in two years comparison 5.6 to 7. Sugar stayed fine. Hopes weight loss continues so don't have to go through surgery for weight loss. Afraid have more physical problems than do. Just saw dietician telling her what gave her in terms of full plate patient plate not as full as she gave her. Part of the reason could be feeling better as well as starting the pain management went up on it. Patient says reflected she is 45 and 7 grandkids wants to lose enough to a point can take them to park and run around. With back not being fixed, meds make her more mobile meaning can make a meal, biggest thing is back bothering her which she does have has meds for and her feet.  Bariatric doctor asked what neuropathy from? Thinks it is nerve damage in back going down to feet. Where blood flow issue probably coming from can probably have ablation done again where burn the nerve in back. what that supposed to do send signals to brain that don't hurt anymore. No surgery to fix what is wrong yet with back. Hurt lower back fell off corner of porch at 16. Doctor T says it is the weight. Muscle and back pain patient believes could be the weight don't believe the nerve is weight. Won't fix the nerve problem but help take pressure off it. Makes sense but not crazy not stupid. If not pinched nerve why pressure and they know it is nerves between facets where there is no surgery for yet. Can't put asleep to burn the nerve. Before when tried to get ablation patient didn't get to the first probe and passed out so couldn't continue heart was going to stop. Suggesting a different doctor to see if they will do it. Doctor  hasn't called back. Only thing do strong meds until can fix it. Feel a little bit better taking meds but eventually won't work. Has drive heaves in morning patient said nobody should feel that bad, sinus not draining. Hard to take something in morning need something warm. Thought of taking something cold doesn't appeal.  For weight loss they want her to take something in the morning patient says until figure out what is going on with left foot activity not going to get better. Afraid to walk to mailbox with foot and hip problem which is a little distance. Walk one leg shorter. Wish felt like other 90 year olds. Patient can see her part including that she worries all the time. Goes on Facebook belongs to lots of loss groups.  Shared a very meaningful experience of a person did not know send her card first Christmas and new place which was about 3 years ago, will be people as well different times of the year tell her thinking of [name]. Shares that  the card was the first card got since lost [name] and [place] first Christmas three years ago at house, won't forget still up behind her, sent one back they lost a child as well. It helped but also  hurt because cried so much could be the thoughtfulness of stranger, when lost [name] describes lost a lot of her supports. Gets on to see if anything changes and then gets off with Facebook.              ? ?Therapist reviewed medical issues with patient felt helpful to have a better understanding of what she is going through and her stressors think helpful for patient to share so somebody has a good understanding of all the different issues.  Noted the struggles but also the positive updates patient losing weight which therapist really think will be helpful for different medical issues, helpful and how she feels.  Also increased pain medication which is helping with mobility.  At the same time still issues with mobility as well as her left foot.  To see losing weight is a big  accomplishment particularly as she would like to avoid bariatric surgery for complications that may cause.  Validated and provided empathy for struggles with sleep patient is having.  Noted her worry that is pass

## 2021-08-05 ENCOUNTER — Encounter: Payer: Self-pay | Admitting: Family Medicine

## 2021-08-05 ENCOUNTER — Ambulatory Visit (INDEPENDENT_AMBULATORY_CARE_PROVIDER_SITE_OTHER): Payer: Medicare Other | Admitting: Family Medicine

## 2021-08-05 ENCOUNTER — Other Ambulatory Visit: Payer: Self-pay

## 2021-08-05 VITALS — BP 136/63 | HR 90 | Ht 66.0 in | Wt 287.0 lb

## 2021-08-05 DIAGNOSIS — H6992 Unspecified Eustachian tube disorder, left ear: Secondary | ICD-10-CM

## 2021-08-05 DIAGNOSIS — E559 Vitamin D deficiency, unspecified: Secondary | ICD-10-CM | POA: Diagnosis not present

## 2021-08-05 DIAGNOSIS — E538 Deficiency of other specified B group vitamins: Secondary | ICD-10-CM | POA: Diagnosis not present

## 2021-08-05 DIAGNOSIS — R7301 Impaired fasting glucose: Secondary | ICD-10-CM | POA: Diagnosis not present

## 2021-08-05 DIAGNOSIS — R7989 Other specified abnormal findings of blood chemistry: Secondary | ICD-10-CM | POA: Diagnosis not present

## 2021-08-05 DIAGNOSIS — R5383 Other fatigue: Secondary | ICD-10-CM | POA: Diagnosis not present

## 2021-08-05 MED ORDER — PREDNISONE 20 MG PO TABS: 40.0000 mg | ORAL_TABLET | Freq: Every day | ORAL | 0 refills | Status: DC

## 2021-08-05 NOTE — Progress Notes (Signed)
? ?Acute Office Visit ? ?Subjective:  ? ? Patient ID: Ashley Gomez, female    DOB: [birth year], 51 y.o.   MRN: [id number] ? ?Chief Complaint  ?Patient presents with  ? Ear Pain  ? ? ?HPI ?Patient is in today for left ear ache.  She says it started when she had strep throat about 3 weeks ago she did a video visit at that time.  Was treated with amoxicillin.  She says she just feels like she is in a tunnel but as she is not having significant pain.  He has been taking Xyzal for her allergies.  She says that her throat feels much better overall still just feels a little scratchy but she feels like some of that may also just be the pollen and allergies.  Tried stretching her jaw she says it feels like if she could just get it to pop she would get some relief. ? ?Past Medical History:  ?Diagnosis Date  ? Anxiety   ? Arthritis   ? CVA (cerebral infarction)   ? Old CVA on MRI brain  ? Depression   ? DVT (deep venous thrombosis) ([place]) 12/13/2009  ? L basilic vein   ? Facet hypertrophy of lumbar region   ? MRI 2007  ? Fibromyalgia   ? GERD (gastroesophageal reflux disease)   ? Hypertension   ? Migraines   ? MS (multiple sclerosis) ([place])   ? Neuromuscular disorder ([place])   ? Obesity   ? Pre-diabetes   ? PTSD (post-traumatic stress disorder)   ? Seizures ([place]) 2011  ? no seizure since onset  ? Smoking   ? Stroke [facility])   ? TIA, >8years ago  ? ? ?Past Surgical History:  ?Procedure Laterality Date  ? CHOLECYSTECTOMY  10/2017  ? CHONDROPLASTY Left 12/30/2014  ? Procedure: CHONDROPLASTY;  Surgeon: Leandrew Koyanagi, MD;  Location: [place];  Service: Orthopedics;  Laterality: Left;  ? KNEE ARTHROSCOPY Left 12/30/2014  ? Procedure: LEFT KNEE ARTHROSCOPY WITH   CHONDROPLASTY;  Surgeon: Leandrew Koyanagi, MD;  Location: [place];  Service: Orthopedics;  Laterality: Left;  ? PARTIAL KNEE ARTHROPLASTY Left 08/03/2016  ? Procedure: LEFT UNICOMPARTMENTAL KNEE ARTHROPLASTY;  Surgeon: Leandrew Koyanagi, MD;  Location: [place];   Service: Orthopedics;  Laterality: Left;  ? SHOULDER ARTHROSCOPY WITH BICEPSTENOTOMY Left 01/15/2020  ? Procedure: SHOULDER ARTHROSCOPY WITH BICEPSTENOTOMY;  Surgeon: Hiram Gash, MD;  Location: [place];  Service: Orthopedics;  Laterality: Left;  ? SHOULDER ARTHROSCOPY WITH DISTAL CLAVICLE RESECTION Left 01/15/2020  ? Procedure: SHOULDER ARTHROSCOPY WITH DISTAL CLAVICULECTOMY;  Surgeon: Hiram Gash, MD;  Location: [place];  Service: Orthopedics;  Laterality: Left;  ? SHOULDER ARTHROSCOPY WITH ROTATOR CUFF REPAIR AND SUBACROMIAL DECOMPRESSION Left 01/15/2020  ? Procedure: SHOULDER ARTHROSCOPY DEBRIDMENT WITH ROTATOR CUFF REPAIR, SUBACROMIAL DECOMPRESSION WITH ACROMIPLASTY, AND BICEP TENOTOMY;  Surgeon: Hiram Gash, MD;  Location: [place];  Service: Orthopedics;  Laterality: Left;  ? TONSILLECTOMY    ? TUBAL LIGATION  1992  ? ? ?Family History  ?Problem Relation Age of Onset  ? Cancer Mother 53  ?     melanoma  ? Hypertension Sister   ? Heart disease Sister   ?     valve disease  ? Depression Sister   ? Diabetes Sister   ? Sjogren's syndrome Sister   ? ? ?Social History  ? ?Socioeconomic History  ? Marital status: Divorced  ?  Spouse  name: Not on file  ? Number of children: 3  ? Years of education: 61  ? Highest education level: GED or equivalent  ?Occupational History  ? Occupation: Legally disabled  ?Tobacco Use  ? Smoking status: Every Day  ?  Packs/day: 1.00  ?  Years: 25.00  ?  Pack years: 25.00  ?  Types: Cigarettes  ? Smokeless tobacco: Never  ? Tobacco comments:  ?  Patient currently smoking 2-3 cigarettes a day   ?Vaping Use  ? Vaping Use: Never used  ?Substance and Sexual Activity  ? Alcohol use: No  ?  Alcohol/week: 0.0 standard drinks  ? Drug use: No  ? Sexual activity: Yes  ?  Birth control/protection: None, Post-menopausal  ?Other Topics Concern  ? Not on file  ?Social History Narrative  ? Her son lives with her after a car accident that he was  involved in. She had three children, one has deceased. She enjoys reading, watching television and spending time with her children and grandchildren.  ? ?Social Determinants of Health  ? ?Financial Resource Strain: Low Risk   ? Difficulty of Paying Living Expenses: Not hard at all  ?Food Insecurity: No Food Insecurity  ? Worried About Charity fundraiser in the Last Year: Never true  ? Ran Out of Food in the Last Year: Never true  ?Transportation Needs: No Transportation Needs  ? Lack of Transportation (Medical): No  ? Lack of Transportation (Non-Medical): No  ?Physical Activity: Inactive  ? Days of Exercise per Week: 0 days  ? Minutes of Exercise per Session: 0 min  ?Stress: Stress Concern Present  ? Feeling of Stress : Rather much  ?Social Connections: Socially Isolated  ? Frequency of Communication with Friends and Family: More than three times a week  ? Frequency of Social Gatherings with Friends and Family: Once a week  ? Attends Religious Services: Never  ? Active Member of Clubs or Organizations: No  ? Attends Archivist Meetings: Never  ? Marital Status: Divorced  ?Intimate Partner Violence: Not At Risk  ? Fear of Current or Ex-Partner: No  ? Emotionally Abused: No  ? Physically Abused: No  ? Sexually Abused: No  ? ? ?Outpatient Medications Prior to Visit  ?Medication Sig Dispense Refill  ? acetaminophen (TYLENOL) 500 MG tablet Take by mouth.    ? amLODipine (NORVASC) 2.5 MG tablet TAKE 1 TABLET BY MOUTH EVERY DAY 90 tablet 1  ? ammonium lactate (LAC-HYDRIN) 12 % lotion See admin instructions.    ? Atogepant (QULIPTA) 10 MG TABS Take 10 mg by mouth daily. 90 tablet 1  ? atorvastatin (LIPITOR) 20 MG tablet Take by mouth.    ? azelastine (ASTELIN) 0.1 % nasal spray As needed    ? busPIRone (BUSPAR) 7.5 MG tablet Take 1 tablet (7.5 mg total) by mouth 2 (two) times daily as needed. 180 tablet 0  ? chlorthalidone (HYGROTON) 25 MG tablet Take by mouth.    ? clindamycin (CLEOCIN T) 1 % external solution  Apply topically.    ? clobetasol cream (TEMOVATE) 0.05 % Apply topically.    ? diazepam (VALIUM) 5 MG tablet TAKE 1 TABLET BY MOUTH EVERY DAY AS NEEDED FOR ANXIETY 30 tablet 1  ? DULoxetine (CYMBALTA) 30 MG capsule YAKE WITH 60MG . TOTAL DAILY DOSE IS  90 MG 90 capsule 0  ? DULoxetine (CYMBALTA) 60 MG capsule Take 1 capsule (60 mg total) by mouth daily. 90 capsule 0  ? famotidine (PEPCID) 20  MG tablet TAKE 1 TABLET BY MOUTH EVERY DAY 90 tablet 3  ? levocetirizine (XYZAL) 5 MG tablet Take 5 mg by mouth daily.    ? metoprolol succinate (TOPROL-XL) 25 MG 24 hr tablet TAKE 1 TABLET BY MOUTH EVERY DAY 90 tablet 1  ? mupirocin ointment (BACTROBAN) 2 % SMARTSIG:1 Application Topical 2-3 Times Daily    ? NARCAN 4 MG/0.1ML LIQD nasal spray kit USE AS DIRECTED    ? [facility] ? ?Circulation-#5    ? omeprazole (PRILOSEC) 40 MG capsule TAKE 1 CAPSULE BY MOUTH EVERY DAY 90 capsule 3  ? ondansetron (ZOFRAN-ODT) 8 MG disintegrating tablet Take 8 mg by mouth every 8 (eight) hours as needed.    ? sucralfate (CARAFATE) 1 GM/10ML suspension SMARTSIG:Milliliter(s) By Mouth    ? BD PEN NEEDLE NANO 2ND GEN 32G X 4 MM MISC     ? cyclobenzaprine (FLEXERIL) 10 MG tablet Take by mouth.    ? HYDROcodone-acetaminophen (NORCO) 10-325 MG tablet Take 1 tablet by mouth every 6 (six) hours as needed for pain. for pain  0  ? KLOR-CON M20 20 MEQ tablet Take 20 mEq by mouth 2 (two) times daily.    ? VICTOZA 18 MG/3ML SOPN Inject 1.8 mg into the skin daily.  1  ? ?No facility-administered medications prior to visit.  ? ? ?Allergies  ?Allergen Reactions  ? Celebrex [Celecoxib] Other (See Comments)  ?  Swelling in extremities  ? Phenytoin Swelling  ? Topamax [Topiramate] Other (See Comments)  ?  She reports that this makes her forgetful and causes her to studder  ? Nucynta [Tapentadol] Other (See Comments)  ?  Headaches, abdominal pain   ? Ace Inhibitors Cough  ? Baclofen Nausea Only  ? Celexa [Citalopram Hydrobromide] Other (See  Comments)  ?  Dizziness ?Tolerated Lexapro ?  ? Codeine Nausea Only  ? Methocarbamol Nausea Only  ? Sumatriptan Nausea Only  ? Tizanidine Other (See Comments)  ?  Keeps her awake.   ? Tramadol Anxiety and P

## 2021-08-11 DIAGNOSIS — G35 Multiple sclerosis: Secondary | ICD-10-CM | POA: Diagnosis not present

## 2021-08-11 DIAGNOSIS — E119 Type 2 diabetes mellitus without complications: Secondary | ICD-10-CM | POA: Diagnosis not present

## 2021-08-11 DIAGNOSIS — K219 Gastro-esophageal reflux disease without esophagitis: Secondary | ICD-10-CM | POA: Diagnosis not present

## 2021-08-11 DIAGNOSIS — D735 Infarction of spleen: Secondary | ICD-10-CM | POA: Diagnosis not present

## 2021-08-11 DIAGNOSIS — E559 Vitamin D deficiency, unspecified: Secondary | ICD-10-CM | POA: Diagnosis not present

## 2021-08-11 DIAGNOSIS — M5136 Other intervertebral disc degeneration, lumbar region: Secondary | ICD-10-CM | POA: Diagnosis not present

## 2021-08-11 DIAGNOSIS — I10 Essential (primary) hypertension: Secondary | ICD-10-CM | POA: Diagnosis not present

## 2021-08-11 DIAGNOSIS — D509 Iron deficiency anemia, unspecified: Secondary | ICD-10-CM | POA: Diagnosis not present

## 2021-08-11 DIAGNOSIS — E785 Hyperlipidemia, unspecified: Secondary | ICD-10-CM | POA: Diagnosis not present

## 2021-08-11 DIAGNOSIS — F17200 Nicotine dependence, unspecified, uncomplicated: Secondary | ICD-10-CM | POA: Diagnosis not present

## 2021-08-11 LAB — IRON,TIBC AND FERRITIN PANEL
%SAT: 17 % (calc) (ref 16–45)
Ferritin: 73 ng/mL (ref 16–232)
Iron: 55 ug/dL (ref 45–160)
TIBC: 327 mcg/dL (calc) (ref 250–450)

## 2021-08-11 LAB — CBC
HCT: 47.5 % — ABNORMAL HIGH (ref 35.0–45.0)
Hemoglobin: 15.3 g/dL (ref 11.7–15.5)
MCH: 27.1 pg (ref 27.0–33.0)
MCHC: 32.2 g/dL (ref 32.0–36.0)
MCV: 84.1 fL (ref 80.0–100.0)
MPV: 10.2 fL (ref 7.5–12.5)
Platelets: 280 10*3/uL (ref 140–400)
RBC: 5.65 10*6/uL — ABNORMAL HIGH (ref 3.80–5.10)
RDW: 19.5 % — ABNORMAL HIGH (ref 11.0–15.0)
WBC: 8.2 10*3/uL (ref 3.8–10.8)

## 2021-08-11 LAB — HEMOGLOBIN A1C
Hgb A1c MFr Bld: 6 % of total Hgb — ABNORMAL HIGH (ref <5.7)
Mean Plasma Glucose: 126 mg/dL
eAG (mmol/L): 7 mmol/L

## 2021-08-11 LAB — VITAMIN B12: Vitamin B-12: 281 pg/mL (ref 200–1100)

## 2021-08-11 LAB — VITAMIN D 25 HYDROXY (VIT D DEFICIENCY, FRACTURES): Vit D, 25-Hydroxy: 88 ng/mL (ref 30–100)

## 2021-08-11 LAB — VITAMIN B1: Vitamin B1 (Thiamine): 7 nmol/L — ABNORMAL LOW (ref 8–30)

## 2021-08-11 LAB — VITAMIN B6: Vitamin B6: 2.6 ng/mL (ref 2.1–21.7)

## 2021-08-11 LAB — TSH: TSH: 0.87 mIU/L

## 2021-08-15 ENCOUNTER — Other Ambulatory Visit: Payer: Self-pay | Admitting: Family Medicine

## 2021-08-15 ENCOUNTER — Encounter: Payer: Self-pay | Admitting: Family Medicine

## 2021-08-16 ENCOUNTER — Other Ambulatory Visit: Payer: Self-pay | Admitting: *Deleted

## 2021-08-16 DIAGNOSIS — I10 Essential (primary) hypertension: Secondary | ICD-10-CM | POA: Diagnosis not present

## 2021-08-16 DIAGNOSIS — G4733 Obstructive sleep apnea (adult) (pediatric): Secondary | ICD-10-CM | POA: Diagnosis not present

## 2021-08-16 DIAGNOSIS — E119 Type 2 diabetes mellitus without complications: Secondary | ICD-10-CM | POA: Diagnosis not present

## 2021-08-16 NOTE — Telephone Encounter (Signed)
Error

## 2021-08-17 ENCOUNTER — Encounter: Payer: Self-pay | Admitting: Family Medicine

## 2021-08-18 DIAGNOSIS — I10 Essential (primary) hypertension: Secondary | ICD-10-CM | POA: Diagnosis not present

## 2021-08-18 DIAGNOSIS — M79604 Pain in right leg: Secondary | ICD-10-CM | POA: Diagnosis not present

## 2021-08-18 DIAGNOSIS — M79605 Pain in left leg: Secondary | ICD-10-CM | POA: Diagnosis not present

## 2021-08-18 DIAGNOSIS — G379 Demyelinating disease of central nervous system, unspecified: Secondary | ICD-10-CM | POA: Diagnosis not present

## 2021-08-29 DIAGNOSIS — M545 Low back pain, unspecified: Secondary | ICD-10-CM | POA: Diagnosis not present

## 2021-08-29 DIAGNOSIS — Z723 Lack of physical exercise: Secondary | ICD-10-CM | POA: Diagnosis not present

## 2021-08-29 DIAGNOSIS — I10 Essential (primary) hypertension: Secondary | ICD-10-CM | POA: Diagnosis not present

## 2021-08-29 DIAGNOSIS — G35 Multiple sclerosis: Secondary | ICD-10-CM | POA: Diagnosis not present

## 2021-08-29 DIAGNOSIS — M6281 Muscle weakness (generalized): Secondary | ICD-10-CM | POA: Diagnosis not present

## 2021-08-29 DIAGNOSIS — G8929 Other chronic pain: Secondary | ICD-10-CM | POA: Diagnosis not present

## 2021-08-29 DIAGNOSIS — Z79899 Other long term (current) drug therapy: Secondary | ICD-10-CM | POA: Diagnosis not present

## 2021-09-02 ENCOUNTER — Ambulatory Visit: Payer: Medicare Other | Admitting: Sports Medicine

## 2021-09-02 ENCOUNTER — Encounter: Payer: Self-pay | Admitting: Sports Medicine

## 2021-09-06 ENCOUNTER — Ambulatory Visit (INDEPENDENT_AMBULATORY_CARE_PROVIDER_SITE_OTHER): Payer: Medicare Other | Admitting: Licensed Clinical Social Worker

## 2021-09-06 ENCOUNTER — Telehealth (HOSPITAL_COMMUNITY): Payer: Self-pay | Admitting: Psychiatry

## 2021-09-06 DIAGNOSIS — F331 Major depressive disorder, recurrent, moderate: Secondary | ICD-10-CM

## 2021-09-06 DIAGNOSIS — F4310 Post-traumatic stress disorder, unspecified: Secondary | ICD-10-CM

## 2021-09-06 DIAGNOSIS — F411 Generalized anxiety disorder: Secondary | ICD-10-CM | POA: Diagnosis not present

## 2021-09-06 DIAGNOSIS — F4321 Adjustment disorder with depressed mood: Secondary | ICD-10-CM

## 2021-09-06 DIAGNOSIS — G894 Chronic pain syndrome: Secondary | ICD-10-CM

## 2021-09-06 NOTE — Telephone Encounter (Signed)
Letter written

## 2021-09-06 NOTE — Progress Notes (Signed)
? ?THERAPIST PROGRESS NOTE ? ?Session Time: 2:00 PM to 2:55 PM ? ?Participation Level: Active ? ?Behavioral Response: CasualAlertAngry and Dysphoric ? ?Type of Therapy: Individual Therapy ? ?Treatment Goals addressed: stress management and anxiety but continue to work on depression, supportive interventions, trauma, grief, coping ? ?ProgressTowards Goals: Progressing-patient actively processing feelings to help with coping with stressors as well as looking at pros and cons of choices to make healthy choices for herself developing insight as talks about the issue ? ?Interventions: Motivational Interviewing, Solution Focused, Strength-based, Supportive, and Other: Coping ? ?Summary: Ashley Gomez is a 51 y.o. female who presents with Llst 20 lbs still on the fence of weight loss hurt having trouble "walking right" from hurting so bad. Neurologist set up for 4 MRI's on June 3 one is head and the other three are different from top to bottom of spine. Mad about everything seems to be getting worse. The feet say it is neuropathy and Renaud's syndrom, nerve damage lower back causing pain not something she can have surgery for. Hps have been bothering because of way she has to walk. Next step is for Dr. Francesco Runner spine surgery can do nerve ablation. Total new neurologist found out if MS. If not what it is and why so much pain. Pain and describes her walk as "webble wobble" want her to exercise but can't can even walk to mailbox walking makes hip leg worse.  Reports that she gave an accurate description of pain so would write a note that it feels like someone is taking a stick scrapping down legs how bad pain feels. Love for the doctors to feel what she feels 30 minutes. Even playing with the kids, standing and cooking difficult. When sitting down good not in shooting pain pain and down her body. Tired of going to different doctor's. If nerve ablation doesn't work doctor said will be on pain meds and with pain medication  the pain is still worse.  He says the nerves are shot between faucets. Weight loss can help tremendously and looked at the pros and cons of weight loss surgery. Has had to see tons of doctors for weight loss. Seen a nutritionist, PT nurse navigator seen surgeon three different time. Negative of surgery could still be in pain, saggy skin, older pros like to lose weight. Would like to be in less pain. Been to so many doctors at this point saying the heck with it. Thinking if can lost weight and wouldn't have to do the surgery.Different messages from her medical providers frustrating. Transitioned to talking about [name] still supportive and say love them at end of conversation. Still shocked that he isn't father. Bothers patient that Mom  doesn't want a relationship with grand kids. Patient says she is done with trying to have a relationship with her Mom. She has her own family, kids, grand kids Mom chose a long time not to be part of life and patient kept pushing and pushing and so done. Therapist said come to place where why keep doing this to myself. Patient says her "Mom passed away 17 years ago" grandparents were her parents. Only lived most 10 years with her mom. 2 years hasn't spoken her.  Patient going to [place] time and other things mom has never been there for patient.  In session also talked about patient's relationship with siblings. Excellent relationship with brother and sister but also sets boundaries.   ? ?Processed patient's feelings around stressors, her medical issues and discussed getting bariatric  surgery process her feelings to look at pros and cons as well as for her to gain insight to make healthy decisions for herself.  Noted she would be really happy as a priority to have the pain decrease but not a simple path and involves decision making as well as continuing to see doctors to find out underlying reasons that it self is frustrating and a struggle.  In session to talking about relationship  with family particular mom and how patient feels therapist provided her perspective the patient is calling "a Spaete is Spaete" saying things how they are tired of investing into a relationship where she gets heart where there is no reciprocation.  Noticed the positive [name] that there has developed a relationship and though the DNA results were negative how a relationship blot some from thinking he was her father and these relationship can be as strong as family relationships.  Therapist provided support and space to patient as she shared her thoughts and feelings in session assess helpful to get support a place to process feelings to manage her feelings and coping ?Suicidal/Homicidal: No ? ?Plan: Return again in 7 weeks.2.Reviewed treatment issues as needed as well as processing feelings to help with stressors and coping ? ?Diagnosis: Generalized anxiety disorder PTSD, complicated grief, chronic pain syndrome, Major depressive disorder, recurrent moderate ? ?Collaboration of Care: Other none needed ? ?Patient/Guardian was advised Release of Information must be obtained prior to any record release in order to collaborate their care with an outside provider. Patient/Guardian was advised if they have not already done so to contact the registration department to sign all necessary forms in order for [name] to release information regarding their care.  ? ?Consent: Patient/Guardian gives verbal consent for treatment and assignment of benefits for services provided during this visit. Patient/Guardian expressed understanding and agreed to proceed.  ? ?Cordella Register, LCSW ?09/06/2021 ? ?

## 2021-09-07 ENCOUNTER — Other Ambulatory Visit: Payer: Self-pay | Admitting: Family Medicine

## 2021-09-08 DIAGNOSIS — G8929 Other chronic pain: Secondary | ICD-10-CM | POA: Diagnosis not present

## 2021-09-08 DIAGNOSIS — M545 Low back pain, unspecified: Secondary | ICD-10-CM | POA: Diagnosis not present

## 2021-09-09 ENCOUNTER — Encounter: Payer: Self-pay | Admitting: Family Medicine

## 2021-09-09 MED ORDER — DIAZEPAM 5 MG PO TABS
ORAL_TABLET | ORAL | 0 refills | Status: DC
Start: 2021-09-09 — End: 2021-10-13

## 2021-09-13 ENCOUNTER — Other Ambulatory Visit: Payer: Self-pay | Admitting: Family Medicine

## 2021-09-14 ENCOUNTER — Encounter: Payer: Self-pay | Admitting: Family Medicine

## 2021-09-14 DIAGNOSIS — M5137 Other intervertebral disc degeneration, lumbosacral region: Secondary | ICD-10-CM | POA: Diagnosis not present

## 2021-09-14 DIAGNOSIS — M5134 Other intervertebral disc degeneration, thoracic region: Secondary | ICD-10-CM | POA: Diagnosis not present

## 2021-09-14 DIAGNOSIS — M5124 Other intervertebral disc displacement, thoracic region: Secondary | ICD-10-CM | POA: Diagnosis not present

## 2021-09-14 DIAGNOSIS — R9089 Other abnormal findings on diagnostic imaging of central nervous system: Secondary | ICD-10-CM | POA: Diagnosis not present

## 2021-09-14 DIAGNOSIS — M2578 Osteophyte, vertebrae: Secondary | ICD-10-CM | POA: Diagnosis not present

## 2021-09-14 DIAGNOSIS — M50223 Other cervical disc displacement at C6-C7 level: Secondary | ICD-10-CM | POA: Diagnosis not present

## 2021-09-14 DIAGNOSIS — M47817 Spondylosis without myelopathy or radiculopathy, lumbosacral region: Secondary | ICD-10-CM | POA: Diagnosis not present

## 2021-09-14 DIAGNOSIS — M5126 Other intervertebral disc displacement, lumbar region: Secondary | ICD-10-CM | POA: Diagnosis not present

## 2021-09-14 DIAGNOSIS — M47816 Spondylosis without myelopathy or radiculopathy, lumbar region: Secondary | ICD-10-CM | POA: Diagnosis not present

## 2021-09-14 DIAGNOSIS — M47814 Spondylosis without myelopathy or radiculopathy, thoracic region: Secondary | ICD-10-CM | POA: Diagnosis not present

## 2021-09-14 DIAGNOSIS — G379 Demyelinating disease of central nervous system, unspecified: Secondary | ICD-10-CM | POA: Diagnosis not present

## 2021-09-16 ENCOUNTER — Ambulatory Visit (INDEPENDENT_AMBULATORY_CARE_PROVIDER_SITE_OTHER): Payer: Medicare Other | Admitting: Sports Medicine

## 2021-09-16 DIAGNOSIS — M47816 Spondylosis without myelopathy or radiculopathy, lumbar region: Secondary | ICD-10-CM

## 2021-09-16 DIAGNOSIS — G35 Multiple sclerosis: Secondary | ICD-10-CM

## 2021-09-16 DIAGNOSIS — M160 Bilateral primary osteoarthritis of hip: Secondary | ICD-10-CM | POA: Diagnosis not present

## 2021-09-16 DIAGNOSIS — G894 Chronic pain syndrome: Secondary | ICD-10-CM | POA: Diagnosis not present

## 2021-09-16 DIAGNOSIS — G35D Multiple sclerosis, unspecified: Secondary | ICD-10-CM

## 2021-09-16 NOTE — Assessment & Plan Note (Signed)
As Ashley Gomez does have more of a pain process, we can continue to keep her L4-S1 bilateral facet arthritis in the back of our minds, she did extremely well back in 2022 with facet medial branch blocks, but she had a vagal episode during her ablation and it was aborted. ?She is seeing Dr. Francesco Runner, and I think she would be a good candidate for repeat attempt at L4-S1 facet radiofrequency ablation. ?She is also getting teed up for bariatric surgery which I think will be dramatically helpful with regards to her pain processes. ?Again, her pain is likely related to her degenerative disease rather than any demyelinating disease. ?

## 2021-09-16 NOTE — Assessment & Plan Note (Signed)
Ashley Gomez does have chronic pain syndrome, and likely concurrent fibromyalgia. ?She is currently on Dilaudid managed by her pain management provider, she should continue this, I did explain that my role in this process as a sports medicine/orthopedic provider would be minimal. ?Of note she is currently on 90 mg of Cymbalta, she did not tolerate gabapentin or Lyrica due to weight gain. ?Amitriptyline may be an option in the future. ?

## 2021-09-16 NOTE — Assessment & Plan Note (Addendum)
Ashley Gomez is back, she does have patchy demyelinating processes on her brain MRI that are not consistent with classic multiple sclerosis, and she has had more of a questionable diagnosis as to whether this is truly multiple sclerosis or not, her neurologist does agree there is demyelinating disease, and it looks like she is getting set up for lumbar puncture potentially looking for oligoclonal bands which can be seen in true MS. ?She was having more pain complaints, I explained to her that multiple sclerosis generally does not cause pain, but rather focal neurologic symptoms associated with demyelinization such as numbness, tingling, and vision loss. ?In addition her cervical, thoracic, and lumbar spinal cord showed no evidence of demyelinating disease. ?She will continue her follow-up with her neurologist for this. ?

## 2021-09-16 NOTE — Progress Notes (Signed)
? ? ?  Procedures performed today:   ? ?None. ? ?Independent interpretation of notes and tests performed by another provider:  ? ?None. ? ?Brief History, Exam, Impression, and Recommendations:   ? ?MULTIPLE SCLEROSIS, RELAPSING/REMITTING ?Ashley Gomez is back, she does have patchy demyelinating processes on her brain MRI that are not consistent with classic multiple sclerosis, and she has had more of a questionable diagnosis as to whether this is truly multiple sclerosis or not, her neurologist does agree there is demyelinating disease, and it looks like she is getting set up for lumbar puncture potentially looking for oligoclonal bands which can be seen in true MS. ?She was having more pain complaints, I explained to her that multiple sclerosis generally does not cause pain, but rather focal neurologic symptoms associated with demyelinization such as numbness, tingling, and vision loss. ?In addition her cervical, thoracic, and lumbar spinal cord showed no evidence of demyelinating disease. ?She will continue her follow-up with her neurologist for this. ? ?Lumbar spondylosis ?As Ashley Gomez does have more of a pain process, we can continue to keep her L4-S1 bilateral facet arthritis in the back of our minds, she did extremely well back in 2022 with facet medial branch blocks, but she had a vagal episode during her ablation and it was aborted. ?She is seeing Dr. Francesco Runner, and I think she would be a good candidate for repeat attempt at L4-S1 facet radiofrequency ablation. ?She is also getting teed up for bariatric surgery which I think will be dramatically helpful with regards to her pain processes. ?Again, her pain is likely related to her degenerative disease rather than any demyelinating disease. ? ?Primary osteoarthritis of both hips ?Known hip osteoarthritis on x-rays. ?If she does have a recurrence of pain in her hip I would be happy to do an injection. ?Her pain processes as above are not due to demyelinating disease but more  due to degenerative disease. ? ?Chronic pain syndrome ?Ashley Gomez does have chronic pain syndrome, and likely concurrent fibromyalgia. ?She is currently on Dilaudid managed by her pain management provider, she should continue this, I did explain that my role in this process as a sports medicine/orthopedic provider would be minimal. ?Of note she is currently on 90 mg of Cymbalta, she did not tolerate gabapentin or Lyrica due to weight gain. ?Amitriptyline may be an option in the future. ? ?I spent 30 minutes of total time managing this patient today, this includes chart review, face to face, and non-face to face time. ? ?___________________________________________ ?Gwen Her. Dianah Field, M.D., ABFM., CAQSM. ?Primary Care and Sports Medicine ?[address] ? ?Adjunct Instructor of Family Medicine  ?[employer] ?

## 2021-09-16 NOTE — Assessment & Plan Note (Signed)
Known hip osteoarthritis on x-rays. ?If she does have a recurrence of pain in her hip I would be happy to do an injection. ?Her pain processes as above are not due to demyelinating disease but more due to degenerative disease. ?

## 2021-09-26 ENCOUNTER — Other Ambulatory Visit: Payer: Self-pay | Admitting: Family Medicine

## 2021-09-26 DIAGNOSIS — I700 Atherosclerosis of aorta: Secondary | ICD-10-CM

## 2021-09-27 ENCOUNTER — Ambulatory Visit: Payer: Medicare Other | Admitting: Family Medicine

## 2021-09-28 ENCOUNTER — Encounter: Payer: Self-pay | Admitting: Family Medicine

## 2021-09-28 ENCOUNTER — Other Ambulatory Visit: Payer: Self-pay | Admitting: *Deleted

## 2021-09-28 DIAGNOSIS — F17210 Nicotine dependence, cigarettes, uncomplicated: Secondary | ICD-10-CM | POA: Diagnosis not present

## 2021-09-28 DIAGNOSIS — M47816 Spondylosis without myelopathy or radiculopathy, lumbar region: Secondary | ICD-10-CM

## 2021-09-28 DIAGNOSIS — Z79899 Other long term (current) drug therapy: Secondary | ICD-10-CM | POA: Diagnosis not present

## 2021-09-28 DIAGNOSIS — M545 Low back pain, unspecified: Secondary | ICD-10-CM | POA: Diagnosis not present

## 2021-09-28 DIAGNOSIS — J329 Chronic sinusitis, unspecified: Secondary | ICD-10-CM | POA: Diagnosis not present

## 2021-09-28 DIAGNOSIS — G8929 Other chronic pain: Secondary | ICD-10-CM | POA: Diagnosis not present

## 2021-10-05 DIAGNOSIS — E785 Hyperlipidemia, unspecified: Secondary | ICD-10-CM | POA: Diagnosis not present

## 2021-10-05 DIAGNOSIS — K760 Fatty (change of) liver, not elsewhere classified: Secondary | ICD-10-CM | POA: Diagnosis not present

## 2021-10-05 DIAGNOSIS — E278 Other specified disorders of adrenal gland: Secondary | ICD-10-CM | POA: Diagnosis not present

## 2021-10-05 DIAGNOSIS — I700 Atherosclerosis of aorta: Secondary | ICD-10-CM | POA: Diagnosis not present

## 2021-10-05 DIAGNOSIS — G35 Multiple sclerosis: Secondary | ICD-10-CM | POA: Diagnosis not present

## 2021-10-05 DIAGNOSIS — M069 Rheumatoid arthritis, unspecified: Secondary | ICD-10-CM | POA: Diagnosis not present

## 2021-10-05 DIAGNOSIS — R569 Unspecified convulsions: Secondary | ICD-10-CM | POA: Diagnosis not present

## 2021-10-05 DIAGNOSIS — I10 Essential (primary) hypertension: Secondary | ICD-10-CM | POA: Diagnosis not present

## 2021-10-05 DIAGNOSIS — K219 Gastro-esophageal reflux disease without esophagitis: Secondary | ICD-10-CM | POA: Diagnosis not present

## 2021-10-12 ENCOUNTER — Other Ambulatory Visit: Payer: Self-pay | Admitting: Family Medicine

## 2021-10-12 DIAGNOSIS — H6983 Other specified disorders of Eustachian tube, bilateral: Secondary | ICD-10-CM | POA: Diagnosis not present

## 2021-10-12 DIAGNOSIS — H903 Sensorineural hearing loss, bilateral: Secondary | ICD-10-CM | POA: Diagnosis not present

## 2021-10-13 DIAGNOSIS — G35 Multiple sclerosis: Secondary | ICD-10-CM | POA: Diagnosis not present

## 2021-10-13 DIAGNOSIS — G894 Chronic pain syndrome: Secondary | ICD-10-CM | POA: Diagnosis not present

## 2021-10-13 DIAGNOSIS — M5459 Other low back pain: Secondary | ICD-10-CM | POA: Diagnosis not present

## 2021-10-13 DIAGNOSIS — M5136 Other intervertebral disc degeneration, lumbar region: Secondary | ICD-10-CM | POA: Diagnosis not present

## 2021-10-13 DIAGNOSIS — I10 Essential (primary) hypertension: Secondary | ICD-10-CM | POA: Diagnosis not present

## 2021-10-13 DIAGNOSIS — M47816 Spondylosis without myelopathy or radiculopathy, lumbar region: Secondary | ICD-10-CM | POA: Diagnosis not present

## 2021-10-13 DIAGNOSIS — M797 Fibromyalgia: Secondary | ICD-10-CM | POA: Diagnosis not present

## 2021-10-18 DIAGNOSIS — I10 Essential (primary) hypertension: Secondary | ICD-10-CM | POA: Diagnosis not present

## 2021-10-18 DIAGNOSIS — Z720 Tobacco use: Secondary | ICD-10-CM | POA: Diagnosis not present

## 2021-10-18 DIAGNOSIS — M79605 Pain in left leg: Secondary | ICD-10-CM | POA: Diagnosis not present

## 2021-10-18 DIAGNOSIS — K219 Gastro-esophageal reflux disease without esophagitis: Secondary | ICD-10-CM | POA: Diagnosis not present

## 2021-10-18 DIAGNOSIS — E119 Type 2 diabetes mellitus without complications: Secondary | ICD-10-CM | POA: Diagnosis not present

## 2021-10-18 DIAGNOSIS — M79604 Pain in right leg: Secondary | ICD-10-CM | POA: Diagnosis not present

## 2021-10-18 DIAGNOSIS — E559 Vitamin D deficiency, unspecified: Secondary | ICD-10-CM | POA: Diagnosis not present

## 2021-10-18 DIAGNOSIS — E519 Thiamine deficiency, unspecified: Secondary | ICD-10-CM | POA: Diagnosis not present

## 2021-10-19 DIAGNOSIS — G35 Multiple sclerosis: Secondary | ICD-10-CM | POA: Diagnosis not present

## 2021-10-19 DIAGNOSIS — I10 Essential (primary) hypertension: Secondary | ICD-10-CM | POA: Diagnosis not present

## 2021-10-25 ENCOUNTER — Ambulatory Visit (HOSPITAL_COMMUNITY): Payer: Medicare Other | Admitting: Licensed Clinical Social Worker

## 2021-10-28 DIAGNOSIS — Z79899 Other long term (current) drug therapy: Secondary | ICD-10-CM | POA: Diagnosis not present

## 2021-10-28 DIAGNOSIS — G8929 Other chronic pain: Secondary | ICD-10-CM | POA: Diagnosis not present

## 2021-10-28 DIAGNOSIS — M545 Low back pain, unspecified: Secondary | ICD-10-CM | POA: Diagnosis not present

## 2021-10-28 DIAGNOSIS — Z79891 Long term (current) use of opiate analgesic: Secondary | ICD-10-CM | POA: Diagnosis not present

## 2021-10-31 ENCOUNTER — Encounter: Payer: Self-pay | Admitting: Sports Medicine

## 2021-11-01 DIAGNOSIS — Z79899 Other long term (current) drug therapy: Secondary | ICD-10-CM | POA: Diagnosis not present

## 2021-11-02 ENCOUNTER — Ambulatory Visit (HOSPITAL_COMMUNITY): Payer: Medicare Other | Admitting: Psychiatry

## 2021-11-02 ENCOUNTER — Telehealth (HOSPITAL_COMMUNITY): Payer: Medicare Other | Admitting: Psychiatry

## 2021-11-09 ENCOUNTER — Ambulatory Visit (INDEPENDENT_AMBULATORY_CARE_PROVIDER_SITE_OTHER): Payer: Medicare Other

## 2021-11-09 ENCOUNTER — Ambulatory Visit (INDEPENDENT_AMBULATORY_CARE_PROVIDER_SITE_OTHER): Payer: Medicare Other | Admitting: Sports Medicine

## 2021-11-09 DIAGNOSIS — M160 Bilateral primary osteoarthritis of hip: Secondary | ICD-10-CM | POA: Diagnosis not present

## 2021-11-09 DIAGNOSIS — M19022 Primary osteoarthritis, left elbow: Secondary | ICD-10-CM | POA: Insufficient documentation

## 2021-11-09 DIAGNOSIS — S59902A Unspecified injury of left elbow, initial encounter: Secondary | ICD-10-CM | POA: Diagnosis not present

## 2021-11-09 MED ORDER — CELECOXIB 200 MG PO CAPS: ORAL_CAPSULE | ORAL | 2 refills | Status: DC

## 2021-11-09 MED ORDER — DICLOFENAC SODIUM 1 % EX GEL: 4.0000 g | Freq: Four times a day (QID) | CUTANEOUS | 11 refills | Status: DC

## 2021-11-09 NOTE — Assessment & Plan Note (Signed)
Using good weight on Mounjaro, currently on 7.5 mg, I have congratulated her, and I would like her to continue the journey up to the maximum dose. We may be able to keep her out of the operating room with regards to bariatric surgery.

## 2021-11-09 NOTE — Assessment & Plan Note (Signed)
Fall 2 weeks ago, increasing pain left elbow localized over the anconeus muscle, no discrete tenderness over the olecranon or the lateral epicondyle. I do think she is got some underlying elbow osteoarthritis. Adding x-rays, Celebrex and she does have dyspepsia with other NSAIDs, topical Voltaren. Return to see me in 2 to 4 weeks, elbow joint injection if not better.

## 2021-11-09 NOTE — Assessment & Plan Note (Signed)
Increasing right hip pain after fall, we will try the Celebrex first before considering hip joint injection.

## 2021-11-09 NOTE — Progress Notes (Signed)
    Procedures performed today:    None.  Independent interpretation of notes and tests performed by another provider:   None.  Brief History, Exam, Impression, and Recommendations:    Primary osteoarthritis of left elbow Fall 2 weeks ago, increasing pain left elbow localized over the anconeus muscle, no discrete tenderness over the olecranon or the lateral epicondyle. I do think she is got some underlying elbow osteoarthritis. Adding x-rays, Celebrex and she does have dyspepsia with other NSAIDs, topical Voltaren. Return to see me in 2 to 4 weeks, elbow joint injection if not better.  Primary osteoarthritis of both hips Increasing right hip pain after fall, we will try the Celebrex first before considering hip joint injection.  Chronic process with exacerbation and pharmacologic intervention  ____________________________________________ Gwen Her. Dianah Field, M.D., ABFM., CAQSM., AME. Primary Care and Sports Medicine [facility]  Adjunct Professor of [place] [employer] [facility] of Medicine  Risk manager

## 2021-11-13 ENCOUNTER — Other Ambulatory Visit: Payer: Self-pay | Admitting: Family Medicine

## 2021-11-14 ENCOUNTER — Encounter: Payer: Self-pay | Admitting: Family Medicine

## 2021-11-14 MED ORDER — DIAZEPAM 5 MG PO TABS
ORAL_TABLET | ORAL | 0 refills | Status: DC
Start: 2021-11-14 — End: 2021-12-14

## 2021-11-14 NOTE — Telephone Encounter (Signed)
Last written 10/13/2021 for one month no refills. Please advise.

## 2021-11-21 ENCOUNTER — Other Ambulatory Visit: Payer: Self-pay | Admitting: Family Medicine

## 2021-11-21 DIAGNOSIS — G43019 Migraine without aura, intractable, without status migrainosus: Secondary | ICD-10-CM

## 2021-11-24 ENCOUNTER — Ambulatory Visit (INDEPENDENT_AMBULATORY_CARE_PROVIDER_SITE_OTHER): Payer: Medicare Other

## 2021-11-24 ENCOUNTER — Encounter: Payer: Self-pay | Admitting: Sports Medicine

## 2021-11-24 ENCOUNTER — Ambulatory Visit (INDEPENDENT_AMBULATORY_CARE_PROVIDER_SITE_OTHER): Payer: Medicare Other | Admitting: Sports Medicine

## 2021-11-24 ENCOUNTER — Ambulatory Visit: Payer: Self-pay

## 2021-11-24 DIAGNOSIS — M19022 Primary osteoarthritis, left elbow: Secondary | ICD-10-CM | POA: Diagnosis not present

## 2021-11-24 DIAGNOSIS — M75122 Complete rotator cuff tear or rupture of left shoulder, not specified as traumatic: Secondary | ICD-10-CM | POA: Diagnosis not present

## 2021-11-24 DIAGNOSIS — M160 Bilateral primary osteoarthritis of hip: Secondary | ICD-10-CM

## 2021-11-24 NOTE — Progress Notes (Signed)
    Procedures performed today:    Procedure: Real-time Ultrasound Guided injection of the left elbow Device: Samsung HS60  Verbal informed consent obtained.  Time-out conducted.  Noted no overlying erythema, induration, or other signs of local infection.  Skin prepped in a sterile fashion.  Local anesthesia: Topical Ethyl chloride.  With sterile technique and under real time ultrasound guidance: Spurring noted, 1 cc Kenalog 40, 1 cc lidocaine, 1 cc bupivacaine injected easily Completed without difficulty  Advised to call if fevers/chills, erythema, induration, drainage, or persistent bleeding.  Images permanently stored and available for review in PACS.  Impression: Technically successful ultrasound guided injection.  Procedure: Real-time Ultrasound Guided injection of the right femoroacetabular joint Device: Samsung HS60  Verbal informed consent obtained.  Time-out conducted.  Noted no overlying erythema, induration, or other signs of local infection.  Skin prepped in a sterile fashion.  Local anesthesia: Topical Ethyl chloride.  With sterile technique and under real time ultrasound guidance: Arthritic changes noted, 1 cc Kenalog 40, 2 cc lidocaine, 2 cc bupivacaine injected easily Completed without difficulty  Advised to call if fevers/chills, erythema, induration, drainage, or persistent bleeding.  Images permanently stored and available for review in PACS.  Impression: Technically successful ultrasound guided injection.  Independent interpretation of notes and tests performed by another provider:   None.  Brief History, Exam, Impression, and Recommendations:    Primary osteoarthritis of both hips Increasing right hip pain, x-rays with osteoarthritis. Failed conservative treatment so we will do a right hip joint injection today.  Primary osteoarthritis of left elbow Persistent left elbow pain in spite of conservative treatment, elbow joint injection today. Return in 6  weeks for this.  Rotator cuff tear, left History of large left-sided rotator cuff tear status postrepair with Dr. Griffin Basil back in 2021, increasing pain, needs to discuss this with Dr. Griffin Basil.    ____________________________________________ Gwen Her. Dianah Field, M.D., ABFM., CAQSM., AME. Primary Care and Sports Medicine [facility]  Adjunct Professor of [employer] [facility] [employer] of [employer]  Risk manager

## 2021-11-24 NOTE — Assessment & Plan Note (Signed)
History of large left-sided rotator cuff tear status postrepair with Dr. Griffin Basil back in 2021, increasing pain, needs to discuss this with Dr. Griffin Basil.

## 2021-11-24 NOTE — Assessment & Plan Note (Signed)
Increasing right hip pain, x-rays with osteoarthritis. Failed conservative treatment so we will do a right hip joint injection today.

## 2021-11-24 NOTE — Assessment & Plan Note (Signed)
Persistent left elbow pain in spite of conservative treatment, elbow joint injection today. Return in 6 weeks for this.

## 2021-11-25 DIAGNOSIS — Z79899 Other long term (current) drug therapy: Secondary | ICD-10-CM | POA: Diagnosis not present

## 2021-11-25 DIAGNOSIS — G8929 Other chronic pain: Secondary | ICD-10-CM | POA: Diagnosis not present

## 2021-11-25 DIAGNOSIS — G43009 Migraine without aura, not intractable, without status migrainosus: Secondary | ICD-10-CM | POA: Diagnosis not present

## 2021-11-25 DIAGNOSIS — M545 Low back pain, unspecified: Secondary | ICD-10-CM | POA: Diagnosis not present

## 2021-11-25 DIAGNOSIS — Z79891 Long term (current) use of opiate analgesic: Secondary | ICD-10-CM | POA: Diagnosis not present

## 2021-11-28 ENCOUNTER — Ambulatory Visit (INDEPENDENT_AMBULATORY_CARE_PROVIDER_SITE_OTHER): Payer: Medicare Other | Admitting: Licensed Clinical Social Worker

## 2021-11-28 ENCOUNTER — Encounter: Payer: Self-pay | Admitting: Sports Medicine

## 2021-11-28 ENCOUNTER — Telehealth (HOSPITAL_COMMUNITY): Payer: Self-pay | Admitting: Psychiatry

## 2021-11-28 DIAGNOSIS — G894 Chronic pain syndrome: Secondary | ICD-10-CM | POA: Diagnosis not present

## 2021-11-28 DIAGNOSIS — F331 Major depressive disorder, recurrent, moderate: Secondary | ICD-10-CM | POA: Diagnosis not present

## 2021-11-28 DIAGNOSIS — F4310 Post-traumatic stress disorder, unspecified: Secondary | ICD-10-CM | POA: Diagnosis not present

## 2021-11-28 DIAGNOSIS — F411 Generalized anxiety disorder: Secondary | ICD-10-CM | POA: Diagnosis not present

## 2021-11-28 DIAGNOSIS — F4321 Adjustment disorder with depressed mood: Secondary | ICD-10-CM

## 2021-11-28 MED ORDER — DULOXETINE HCL 60 MG PO CPEP: 60.0000 mg | ORAL_CAPSULE | Freq: Every day | ORAL | 0 refills | Status: DC

## 2021-11-28 MED ORDER — DULOXETINE HCL 30 MG PO CPEP: ORAL_CAPSULE | ORAL | 0 refills | Status: DC

## 2021-11-28 NOTE — Telephone Encounter (Signed)
Pt needs refill on both cymbalta  [facility]  Next Visit -7/24 Last Visit - 3/21

## 2021-11-28 NOTE — Progress Notes (Signed)
THERAPIST PROGRESS NOTE  Session Time: 11:00 AM to 11:52 AM  Participation Level: Active  Behavioral Response: CasualAlertAnxious and Euthymic  Type of Therapy: Individual Therapy  Treatment Goals addressed: Patient notes depression better but continue to work on coping skills, continue to work on Mudlogger of anxiety and anxiety attacks, trauma, grief, stress management coping ProgressTowards Goals: Progressing-in session patient has insight therapist agrees patient working on herself and sees progress with management of depression working on herself as a person and sees progress  Interventions: Solution Focused, Strength-based, Supportive, and Other: Coping  Summary: Ashley Gomez is a 51 y.o. female who presents with lost 39 lobs since January. Diabetes medication helping no appetite. 2-6 lbs a month perfect amount according to doctors. Neurologist three shots a week for MS, diabetes once a week a shot. Not looking forward to lumbar puncture August 1. They stick in spine a large needle to draw out spinal funeral. Dr. Darene Gomez. Has given her shots, steroid shot in elbow fell and reinjured her shoulder. Also in her inner hip got a shot. She couldn't walk want wanted to know from doctor was because of shot- couldn't sleep, aggravated sciatica nerve. After shot could move around doesn't know if shot or moving around. Explored how she feels that she feels good but tired. Raynaud's in feet. Oxygen not getting to blood vessel in feet. If lower back was  better and sciatica would be ok. Went to Psychologist, sport and exercise for her back. Doesn't want her to schedule unless she knows about the bariatric surgery. . Had problems the first one had to burn nerves blood pressure dropped.  Looking at pros and cons of the surgery doesn't want anymore pain, fifty pounds of sagging skin. Seeing the dietitian and nutritionist for the surgery they telling what she needs to do started to prepare for surgery helping her to lose weight.   Brother dystonia and putting in a brain stimulator.  Therapist and patient talked about estranged with mom. Doesn't know her kids and grandkids doesn't show interest in patient and family.  Patient has reflected on this after everything gone through that she is a mom and grandmother, her health, almost losing son, losing Ashley Gomez five grandchildren and who don't have a mom. She hasn't been there for one thing. Can't wrap her brain around not caring even 1%. Forgive yes will not dwell on it but not forget. Still catch herself holding her breath called fat. Somebody could hear her breathing. "Go to your room I hear you breathing" "I can hear your fat asses breathing" is what her mom would say couldn't ask for water. Patient says there is a lot. Gets aggravated and frustrated. Doesn't have family to share her joys with only a couple of people. Not going to let her treat her whatever way mom wants.  Therapist noted this as progress and patient has felt helpful to come and talk to help her get through things, she feels a better person better mom and grandmother.   Therapist noted when patient came for visit she was looking good patient had positive report of losing over 30 pounds.  Therapist noted this as progress and success with her goals and gave her positive feedback about this.  She also is on track with managing different medical issues that helps although shared about sciatica that is giving her trouble, upcoming appointment for lumbar puncture she is stressed about.  Review of treatment plan patient gave consent to complete virtually Ashley Gomez noted her own insight that  work on herself she feels a better person therapist agrees noted and patient also sees more assertive therapist that a good mom grandmother she is the Stage manager of the family therapist sees that as a strength.  Utilized session to process feelings about mom agreeing with patient does not need to engage with mom if she is not treating her decently that  we remove ourselves from people who are hurtful and not supportive.  Therapist perspective was patient's attitude of forgive but not forget was useful as we do not want to forget how people treat [name] it actually serves as a way to protect ourselves.  Therapist provided positive feedback the patient has more emotional distance with this.  Talked about past trauma therapist also thinks helpful for patient to talk about this when it comes up in therapy.  Therapist provided space and support for patient to talk about thoughts and feelings in session Suicidal/Homicidal: No  Plan: Return again in  2 months.2.Reviewed treatment issues as needed as well as processing feelings to help with stressors and coping   Diagnosis: Generalized anxiety disorder PTSD, complicated grief, chronic pain syndrome, Major depressive disorder, recurrent moderate   Collaboration of Care: Other none needed  Patient/Guardian was advised Release of Information must be obtained prior to any record release in order to collaborate their care with an outside provider. Patient/Guardian was advised if they have not already done so to contact the registration department to sign all necessary forms in order for [name] to release information regarding their care.   Consent: Patient/Guardian gives verbal consent for treatment and assignment of benefits for services provided during this visit. Patient/Guardian expressed understanding and agreed to proceed.   Cordella Register, LCSW 11/28/2021

## 2021-11-28 NOTE — Telephone Encounter (Signed)
sent 

## 2021-11-29 ENCOUNTER — Ambulatory Visit: Payer: Medicare Other | Admitting: Family Medicine

## 2021-11-30 ENCOUNTER — Ambulatory Visit (INDEPENDENT_AMBULATORY_CARE_PROVIDER_SITE_OTHER): Payer: Medicare Other | Admitting: Family Medicine

## 2021-11-30 ENCOUNTER — Encounter: Payer: Self-pay | Admitting: Family Medicine

## 2021-11-30 ENCOUNTER — Ambulatory Visit: Payer: Medicare Other | Admitting: Sports Medicine

## 2021-11-30 VITALS — BP 136/65 | HR 85 | Ht 66.0 in | Wt 268.0 lb

## 2021-11-30 DIAGNOSIS — R350 Frequency of micturition: Secondary | ICD-10-CM | POA: Insufficient documentation

## 2021-11-30 DIAGNOSIS — D3502 Benign neoplasm of left adrenal gland: Secondary | ICD-10-CM | POA: Diagnosis not present

## 2021-11-30 DIAGNOSIS — I7300 Raynaud's syndrome without gangrene: Secondary | ICD-10-CM | POA: Diagnosis not present

## 2021-11-30 DIAGNOSIS — I10 Essential (primary) hypertension: Secondary | ICD-10-CM

## 2021-11-30 DIAGNOSIS — R109 Unspecified abdominal pain: Secondary | ICD-10-CM | POA: Diagnosis not present

## 2021-11-30 MED ORDER — AMLODIPINE BESYLATE 5 MG PO TABS
5.0000 mg | ORAL_TABLET | Freq: Every day | ORAL | 1 refills | Status: DC
Start: 2021-11-30 — End: 2022-05-22

## 2021-11-30 NOTE — Assessment & Plan Note (Signed)
Crease amlodipine to 5 mg and see if it is helpful with symptoms.  Try to keep feet warm.

## 2021-11-30 NOTE — Telephone Encounter (Signed)
I did not have a chance to finish my note before I had the lunch meeting.  Everything has been sent and ordered.

## 2021-11-30 NOTE — Assessment & Plan Note (Signed)
Sounds most consistent with overactive bladder.  We discussed possible trial of medication to see if it is helpful or not.  Could always refer to urology if not helpful.  She recently had a urinalysis done at her pain management and it was normal no sign of infection and she is not having any dysuria.

## 2021-11-30 NOTE — Assessment & Plan Note (Signed)
Pressure overall looks good.  I like to see systolic less than 967.  But we are going to increase her amlodipine to see if it is helpful with her Raynaud's and that should hopefully help with her blood pressures as well.

## 2021-11-30 NOTE — Progress Notes (Signed)
Established Patient Office Visit  Subjective   Patient ID: Ashley Gomez, female    DOB: [birth year]  Age: 51 y.o. MRN: [id number]  Chief Complaint  Patient presents with   Hypertension    HPI  Hypertension- Pt denies chest pain, SOB, dizziness, or heart palpitations.  Taking meds as directed w/o problems.  Denies medication side effects.    Diabetes - no hypoglycemic events. No wounds or sores that are not healing well. No increased thirst or urination. Checking glucose at home. Taking medications as prescribed without any side effects.  She reports she is actually doing really well in the [facility] it is made a great difference in being able to portion control her weight is down significantly.  She says she actually got up to about 301 pounds back in the winter and she is now down to 269.  She still struggling with pain and discomfort for her Raynaud's and would like to consider increasing her amlodipine.  Currently taking 2.5 mg daily.  Is also been having more right-sided flank pain and is very concerned and would like to have her kidneys rechecked.  She is also been experiencing some increased frequency of urination at night.  She will says she feels like she really needs to go bad like her bladder is full and then she will urinate and not pass as much urine as she feels that there should be.  And then 2030 minutes later she feels like she has to urinate again.    ROS    Objective:     BP 136/65   Pulse 85   Ht '5\' 6"'$  (1.676 m)   Wt 268 lb (121.6 kg)   LMP 12/28/2014   SpO2 95%   BMI 43.26 kg/m    Physical Exam Vitals and nursing note reviewed.  Constitutional:      Appearance: She is well-developed.  HENT:     Head: Normocephalic and atraumatic.  Cardiovascular:     Rate and Rhythm: Normal rate and regular rhythm.     Heart sounds: Normal heart sounds.  Pulmonary:     Effort: Pulmonary effort is normal.     Breath sounds: Normal breath sounds.  Skin:     General: Skin is warm and dry.     Comments: Feet have a erythematous discoloration and some pretty prominent superficial small blood vessels on the surface especially on the top of her left foot.  Neurological:     Mental Status: She is alert and oriented to person, place, and time.  Psychiatric:        Behavior: Behavior normal.      No results found for any visits on 11/30/21.    The 10-year ASCVD risk score (Arnett DK, et al., 2019) is: 14.1%    Assessment & Plan:   Problem List Items Addressed This Visit       Cardiovascular and Mediastinum   Raynaud disease    Crease amlodipine to 5 mg and see if it is helpful with symptoms.  Try to keep feet warm.      Relevant Medications   amLODipine (NORVASC) 5 MG tablet   ESSENTIAL HYPERTENSION, BENIGN    Pressure overall looks good.  I like to see systolic less than 154.  But we are going to increase her amlodipine to see if it is helpful with her Raynaud's and that should hopefully help with her blood pressures as well.      Relevant Medications   amLODipine (NORVASC) 5  MG tablet     Endocrine   Adenoma of left adrenal gland   Relevant Orders   US Renal     Other   Urinary frequency - Primary    Sounds most consistent with overactive bladder.  We discussed possible trial of medication to see if it is helpful or not.  Could always refer to urology if not helpful.  She recently had a urinalysis done at her pain management and it was normal no sign of infection and she is not having any dysuria.      Other Visit Diagnoses     Right flank pain           Return in about 6 months (around 06/02/2022).    Beatrice Lecher, MD

## 2021-12-02 ENCOUNTER — Ambulatory Visit (INDEPENDENT_AMBULATORY_CARE_PROVIDER_SITE_OTHER): Payer: Medicare Other

## 2021-12-02 DIAGNOSIS — D3502 Benign neoplasm of left adrenal gland: Secondary | ICD-10-CM

## 2021-12-02 DIAGNOSIS — N2889 Other specified disorders of kidney and ureter: Secondary | ICD-10-CM | POA: Diagnosis not present

## 2021-12-05 ENCOUNTER — Encounter (HOSPITAL_COMMUNITY): Payer: Self-pay | Admitting: Psychiatry

## 2021-12-05 ENCOUNTER — Encounter (INDEPENDENT_AMBULATORY_CARE_PROVIDER_SITE_OTHER): Payer: Medicare Other | Admitting: Sports Medicine

## 2021-12-05 ENCOUNTER — Other Ambulatory Visit (HOSPITAL_COMMUNITY): Payer: Self-pay

## 2021-12-05 ENCOUNTER — Telehealth (INDEPENDENT_AMBULATORY_CARE_PROVIDER_SITE_OTHER): Payer: Medicare Other | Admitting: Psychiatry

## 2021-12-05 ENCOUNTER — Encounter: Payer: Self-pay | Admitting: Sports Medicine

## 2021-12-05 DIAGNOSIS — M47816 Spondylosis without myelopathy or radiculopathy, lumbar region: Secondary | ICD-10-CM

## 2021-12-05 DIAGNOSIS — F4321 Adjustment disorder with depressed mood: Secondary | ICD-10-CM

## 2021-12-05 DIAGNOSIS — M160 Bilateral primary osteoarthritis of hip: Secondary | ICD-10-CM

## 2021-12-05 DIAGNOSIS — G894 Chronic pain syndrome: Secondary | ICD-10-CM | POA: Diagnosis not present

## 2021-12-05 DIAGNOSIS — F411 Generalized anxiety disorder: Secondary | ICD-10-CM

## 2021-12-05 DIAGNOSIS — F331 Major depressive disorder, recurrent, moderate: Secondary | ICD-10-CM

## 2021-12-05 DIAGNOSIS — Z96641 Presence of right artificial hip joint: Secondary | ICD-10-CM

## 2021-12-05 DIAGNOSIS — M19022 Primary osteoarthritis, left elbow: Secondary | ICD-10-CM

## 2021-12-05 MED ORDER — BUSPIRONE HCL 7.5 MG PO TABS: 7.5000 mg | ORAL_TABLET | Freq: Two times a day (BID) | ORAL | 0 refills | Status: DC | PRN

## 2021-12-05 NOTE — Telephone Encounter (Signed)
I spent 5 total minutes of online digital evaluation and management services in this patient-initiated request for online care. 

## 2021-12-05 NOTE — Progress Notes (Signed)
Follow up tele psych  visit   Patient Identification: Ashley Gomez MRN:  [id number] Date of Evaluation:  12/05/2021 Referral Source: Mary . Counsellor      Chief Complaint:   depression follow up   Visit Diagnosis:    ICD-10-CM   1. Generalized anxiety disorder  F41.1     2. Depression, major, recurrent, moderate ([place])  F33.1     3. Chronic pain syndrome  G89.4     4. Complicated grief  K02.54     Virtual Visit via Video Note  I connected with Ashley Gomez on 12/05/21 at  9:30 AM EDT by a video enabled telemedicine application and verified that I am speaking with the correct person using two identifiers.  Location: Patient: home Provider: home office   I discussed the limitations of evaluation and management by telemedicine and the availability of in person appointments. The patient expressed understanding and agreed to proceed.      I discussed the assessment and treatment plan with the patient. The patient was provided an opportunity to ask questions and all were answered. The patient agreed with the plan and demonstrated an understanding of the instructions.   The patient was advised to call back or seek an in-person evaluation if the symptoms worsen or if the condition fails to improve as anticipated.  I provided 15 minutes of non-face-to-face time during this encounter.     History of Present Illness: 51  years old currently single Caucasian female initially referred by her counselor for management of depression anxiety possible PTSD  Has a herniated disc Main concern remains hip pain and getting treatment but still gets distress, following with providers Have lost 30lbs on the new med   Feels meds are doing what they  can Grief manageable  On pain meds for management   Modifying factors; grandkids,  Aggravating factor:daughters past death  Severity fair except for pain , see above   Past Psychiatric History: depression,  anxiety  Previous Psychotropic Medications: Yes   Substance Abuse History in the last 12 months:  No.  Consequences of Substance Abuse: NA  Past Medical History:  Past Medical History:  Diagnosis Date   Anxiety    Arthritis    CVA (cerebral infarction)    Old CVA on MRI brain   Depression    DVT (deep venous thrombosis) ([place]) 27/10/2374   L basilic vein    Facet hypertrophy of lumbar region    MRI 2007   Fibromyalgia    GERD (gastroesophageal reflux disease)    Hypertension    Migraines    MS (multiple sclerosis) ([place])    Neuromuscular disorder ([place])    Obesity    Pre-diabetes    PTSD (post-traumatic stress disorder)    Seizures ([place]) 2011   no seizure since onset   Smoking    Stroke [facility])    TIA, >8years ago    Past Surgical History:  Procedure Laterality Date   CHOLECYSTECTOMY  10/2017   CHONDROPLASTY Left 12/30/2014   Procedure: CHONDROPLASTY;  Surgeon: Leandrew Koyanagi, MD;  Location: [place];  Service: Orthopedics;  Laterality: Left;   KNEE ARTHROSCOPY Left 12/30/2014   Procedure: LEFT KNEE ARTHROSCOPY WITH   CHONDROPLASTY;  Surgeon: Leandrew Koyanagi, MD;  Location: [place];  Service: Orthopedics;  Laterality: Left;   PARTIAL KNEE ARTHROPLASTY Left 08/03/2016   Procedure: LEFT UNICOMPARTMENTAL KNEE ARTHROPLASTY;  Surgeon: Marylynn Pearson  Erlinda Hong, MD;  Location: [place];  Service: Orthopedics;  Laterality: Left;   SHOULDER ARTHROSCOPY WITH BICEPSTENOTOMY Left 01/15/2020   Procedure: SHOULDER ARTHROSCOPY WITH BICEPSTENOTOMY;  Surgeon: Hiram Gash, MD;  Location: [place];  Service: Orthopedics;  Laterality: Left;   SHOULDER ARTHROSCOPY WITH DISTAL CLAVICLE RESECTION Left 01/15/2020   Procedure: SHOULDER ARTHROSCOPY WITH DISTAL CLAVICULECTOMY;  Surgeon: Hiram Gash, MD;  Location: [place];  Service: Orthopedics;  Laterality: Left;   SHOULDER ARTHROSCOPY WITH ROTATOR CUFF REPAIR AND SUBACROMIAL DECOMPRESSION Left  01/15/2020   Procedure: SHOULDER ARTHROSCOPY DEBRIDMENT WITH ROTATOR CUFF REPAIR, SUBACROMIAL DECOMPRESSION WITH ACROMIPLASTY, AND BICEP TENOTOMY;  Surgeon: Hiram Gash, MD;  Location: [place];  Service: Orthopedics;  Laterality: Left;   TONSILLECTOMY     TUBAL LIGATION  1992    Family Psychiatric History: Parents: alcohol use disorder  Family History:  Family History  Problem Relation Age of Onset   Cancer Mother 35       melanoma   Hypertension Sister    Heart disease Sister        valve disease   Depression Sister    Diabetes Sister    Sjogren's syndrome Sister     Social History:   Social History   Socioeconomic History   Marital status: Divorced    Spouse name: Not on file   Number of children: 3   Years of education: 12   Highest education level: GED or equivalent  Occupational History   Occupation: Legally disabled  Tobacco Use   Smoking status: Every Day    Packs/day: 1.00    Years: 25.00    Total pack years: 25.00    Types: Cigarettes   Smokeless tobacco: Never   Tobacco comments:    Patient currently smoking 2-3 cigarettes a day   Vaping Use   Vaping Use: Never used  Substance and Sexual Activity   Alcohol use: No    Alcohol/week: 0.0 standard drinks of alcohol   Drug use: No   Sexual activity: Yes    Birth control/protection: None, Post-menopausal  Other Topics Concern   Not on file  Social History Narrative   Her son lives with her after a car accident that he was involved in. She had three children, one has deceased. She enjoys reading, watching television and spending time with her children and grandchildren.   Social Determinants of Health   Financial Resource Strain: Low Risk  (04/29/2021)   Overall Financial Resource Strain (CARDIA)    Difficulty of Paying Living Expenses: Not hard at all  Food Insecurity: No Food Insecurity (04/29/2021)   Hunger Vital Sign    Worried About Running Out of Food in the Last Year: Never true     Ran Out of Food in the Last Year: Never true  Transportation Needs: No Transportation Needs (04/29/2021)   PRAPARE - Hydrologist (Medical): No    Lack of Transportation (Non-Medical): No  Physical Activity: Inactive (04/29/2021)   Exercise Vital Sign    Days of Exercise per Week: 0 days    Minutes of Exercise per Session: 0 min  Stress: Stress Concern Present (04/29/2021)   [place]    Feeling of Stress : Rather much  Social Connections: Socially Isolated (04/29/2021)   Social Connection and Isolation Panel [NHANES]    Frequency of Communication with Friends and Family: More than three times a week  Frequency of Social Gatherings with Friends and Family: Once a week    Attends Religious Services: Never    Marine scientist or Organizations: No    Attends Archivist Meetings: Never    Marital Status: Divorced      Allergies:   Allergies  Allergen Reactions   Celebrex [Celecoxib] Other (See Comments)    Swelling in extremities   Phenytoin Swelling   Topamax [Topiramate] Other (See Comments)    She reports that this makes her forgetful and causes her to studder   Nucynta [Tapentadol] Other (See Comments)    Headaches, abdominal pain    Ace Inhibitors Cough   Baclofen Nausea Only   Celexa [Citalopram Hydrobromide] Other (See Comments)    Dizziness Tolerated Lexapro    Codeine Nausea Only   Methocarbamol Nausea Only   Sumatriptan Nausea Only   Tizanidine Other (See Comments)    Keeps her awake.    Tramadol Anxiety and Palpitations   Triamcinolone Other (See Comments)    Steroid flare after joint injection, use other steroid for injections    Metabolic Disorder Labs: Lab Results  Component Value Date   HGBA1C 6.0 (H) 08/05/2021   MPG 126 08/05/2021   MPG 117 08/09/2020   Lab Results  Component Value Date   PROLACTIN 4.3 03/04/2013   Lab Results   Component Value Date   CHOL 207 (H) 08/09/2020   TRIG 422 (H) 08/09/2020   HDL 33 (L) 08/09/2020   CHOLHDL 6.3 (H) 08/09/2020   VLDL 30 11/29/2016   [place]  08/09/2020     Comment:     . LDL cholesterol not calculated. Triglyceride levels greater than 400 mg/dL invalidate calculated LDL results. . Reference range: <100 . Desirable range <100 mg/dL for primary prevention;   <70 mg/dL for patients with CHD or diabetic patients  with > or = 2 CHD risk factors. Marland Kitchen LDL-C is now calculated using the Martin-Hopkins  calculation, which is a validated novel method providing  better accuracy than the Friedewald equation in the  estimation of LDL-C.  Cresenciano Genre et al. Annamaria Helling. 9629;528(41): 2061-2068  (http://education.[employer].com/faq/FAQ164)    LDLCALC 98 08/04/2019     Current Medications: Current Outpatient Medications  Medication Sig Dispense Refill   acetaminophen (TYLENOL) 500 MG tablet Take by mouth.     amLODipine (NORVASC) 5 MG tablet Take 1 tablet (5 mg total) by mouth daily. 90 tablet 1   atorvastatin (LIPITOR) 20 MG tablet TAKE 1 TABLET BY MOUTH EVERYDAY AT BEDTIME 90 tablet 3   busPIRone (BUSPAR) 7.5 MG tablet Take 1 tablet (7.5 mg total) by mouth 2 (two) times daily as needed. 180 tablet 0   celecoxib (CELEBREX) 200 MG capsule One to 2 tablets by mouth daily as needed for pain. 60 capsule 2   chlorthalidone (HYGROTON) 25 MG tablet Take by mouth.     clindamycin (CLEOCIN T) 1 % external solution Apply topically 2 (two) times daily.     diazepam (VALIUM) 5 MG tablet TAKE 1 TABLET BY MOUTH EVERY DAY AS NEEDED FOR ANXIETY 30 tablet 0   diclofenac Sodium (VOLTAREN) 1 % GEL Apply 4 g topically 4 (four) times daily. To affected joint. 100 g 11   DULoxetine (CYMBALTA) 30 MG capsule YAKE WITH 60MG . TOTAL DAILY DOSE IS  90 MG 90 capsule 0   DULoxetine (CYMBALTA) 60 MG capsule Take 1 capsule (60 mg total) by mouth daily. 90 capsule 0   famotidine (PEPCID) 20 MG tablet TAKE  1 TABLET BY MOUTH EVERY DAY 90 tablet 3   Glatiramer Acetate 40 MG/ML SOSY Inject into the skin.     HYDROmorphone (DILAUDID) 8 MG tablet Take 1 tablet by mouth every 8 (eight) hours as needed.     levocetirizine (XYZAL) 5 MG tablet Take 5 mg by mouth daily.     metoprolol succinate (TOPROL-XL) 25 MG 24 hr tablet TAKE 1 TABLET BY MOUTH EVERY DAY 90 tablet 1   mupirocin ointment (BACTROBAN) 2 % SMARTSIG:1 Application Topical 2-3 Times Daily     NARCAN 4 MG/0.1ML LIQD nasal spray kit USE AS DIRECTED     omeprazole (PRILOSEC) 40 MG capsule Take 40 mg by mouth daily.     ondansetron (ZOFRAN-ODT) 8 MG disintegrating tablet Take 8 mg by mouth every 8 (eight) hours as needed.     tirzepatide [facility]) 7.5 MG/0.5ML Pen Inject into the skin.     Vitamin D, Ergocalciferol, (DRISDOL) 1.25 MG (50000 UNIT) CAPS capsule Take 50,000 Units by mouth 2 (two) times a week.     No current facility-administered medications for this visit.     Psychiatric Specialty Exam: Review of Systems  Cardiovascular:  Negative for chest pain.  Neurological:  Negative for tremors.  Psychiatric/Behavioral:  Negative for depression. The patient has insomnia.     Last menstrual period 12/28/2014.There is no height or weight on file to calculate BMI.  General Appearance: casual  Eye Contact:fair  Speech:  Normal Rate  Volume:  Decreased  Mood: fair  Affect:  Congruent  Thought Process:  Goal Directed  Orientation:  Full (Time, Place, and Person)  Thought Content:  Logical  Suicidal Thoughts:  No  Homicidal Thoughts:  No  Memory:  Immediate;   Fair Recent;   Fair  Judgement:  Fair  Insight:  Fair  Psychomotor Activity:   Concentration:  Concentration: Fair and Attention Span: Fair  Recall:  [employer] of Knowledge:Fair  Language: Fair  Akathisia:  No  Handed:  Right  AIMS (if indicated):    Assets:  Desire for Improvement  ADL's:  Intact  Cognition: WNL  Sleep:  fair    Treatment Plan  Summary: Medication management and Plan as follows    Prior documentation reviewed   MDD ,recurent moderate: fair continue cymbalta   GAD/ PTSD; anxiety fluctuates but doing fair, continue buspar, cymbalta   Chronic pain: see above following with providers  Insomnia: on small dose valium, reviewed sleep hygiene and follow up with providers for pain  Fu 38m  NMerian Capron MD 7/24/20239:41 AM

## 2021-12-06 ENCOUNTER — Encounter: Payer: Self-pay | Admitting: Family Medicine

## 2021-12-06 DIAGNOSIS — N2889 Other specified disorders of kidney and ureter: Secondary | ICD-10-CM

## 2021-12-06 DIAGNOSIS — R93429 Abnormal radiologic findings on diagnostic imaging of unspecified kidney: Secondary | ICD-10-CM

## 2021-12-06 NOTE — Addendum Note (Signed)
Addended by: Silverio Decamp on: 12/06/2021 09:46 AM   Modules accepted: Orders

## 2021-12-06 NOTE — Assessment & Plan Note (Signed)
Known hip osteoarthritis, had right hip pain, we injected her right hip 11/24/2021, Miller complaining of groin pain but complaining of back pain radiating down to the buttock, and also some lateral hip pain, she had requested lumbar spine MRI and hip MRI, we will order both.  We will not act on either though without an office visit.

## 2021-12-06 NOTE — Progress Notes (Signed)
Hi Ashley Gomez, the area seen in the left adrenal gland is stable still measuring about 2.8 x 2.8 cm.  The radiologist felt like it was consistent which is very reassuring.  On your left kidney they saw an area that looks a little bit thickened.  This may be normal but could also indicate a possible mass so they recommended that we do an MRI for further work-up.  Please let me know if you are okay with moving forward with MRI.

## 2021-12-06 NOTE — Telephone Encounter (Signed)
I spent 5 total minutes of online digital evaluation and management services in this patient-initiated request for online care. 

## 2021-12-07 ENCOUNTER — Ambulatory Visit: Payer: Medicare Other | Admitting: Family Medicine

## 2021-12-09 NOTE — Telephone Encounter (Signed)
Orders Placed This Encounter  Procedures   MR Abdomen W Wo Contrast    Standing Status:   Future    Standing Expiration Date:   12/10/2022    Order Specific Question:   If indicated for the ordered procedure, I authorize the administration of contrast media per Radiology protocol    Answer:   Yes    Order Specific Question:   What is the patient's sedation requirement?    Answer:   No Sedation    Order Specific Question:   Does the patient have a pacemaker or implanted devices?    Answer:   No    Order Specific Question:   Preferred imaging location?    Answer:   Product/process development scientist (table limit-350lbs)

## 2021-12-11 DIAGNOSIS — E876 Hypokalemia: Secondary | ICD-10-CM | POA: Diagnosis not present

## 2021-12-11 DIAGNOSIS — Z885 Allergy status to narcotic agent status: Secondary | ICD-10-CM | POA: Diagnosis not present

## 2021-12-11 DIAGNOSIS — I10 Essential (primary) hypertension: Secondary | ICD-10-CM | POA: Diagnosis not present

## 2021-12-11 DIAGNOSIS — R112 Nausea with vomiting, unspecified: Secondary | ICD-10-CM | POA: Diagnosis not present

## 2021-12-11 DIAGNOSIS — K589 Irritable bowel syndrome without diarrhea: Secondary | ICD-10-CM | POA: Diagnosis not present

## 2021-12-11 DIAGNOSIS — E871 Hypo-osmolality and hyponatremia: Secondary | ICD-10-CM | POA: Diagnosis not present

## 2021-12-11 DIAGNOSIS — Z8673 Personal history of transient ischemic attack (TIA), and cerebral infarction without residual deficits: Secondary | ICD-10-CM | POA: Diagnosis not present

## 2021-12-11 DIAGNOSIS — R197 Diarrhea, unspecified: Secondary | ICD-10-CM | POA: Diagnosis not present

## 2021-12-11 DIAGNOSIS — Z87891 Personal history of nicotine dependence: Secondary | ICD-10-CM | POA: Diagnosis not present

## 2021-12-11 DIAGNOSIS — K5732 Diverticulitis of large intestine without perforation or abscess without bleeding: Secondary | ICD-10-CM | POA: Diagnosis not present

## 2021-12-11 DIAGNOSIS — I050 Rheumatic mitral stenosis: Secondary | ICD-10-CM | POA: Diagnosis not present

## 2021-12-11 DIAGNOSIS — Z888 Allergy status to other drugs, medicaments and biological substances status: Secondary | ICD-10-CM | POA: Diagnosis not present

## 2021-12-11 DIAGNOSIS — F32A Depression, unspecified: Secondary | ICD-10-CM | POA: Diagnosis not present

## 2021-12-11 DIAGNOSIS — E119 Type 2 diabetes mellitus without complications: Secondary | ICD-10-CM | POA: Diagnosis not present

## 2021-12-11 DIAGNOSIS — D3502 Benign neoplasm of left adrenal gland: Secondary | ICD-10-CM | POA: Diagnosis not present

## 2021-12-11 DIAGNOSIS — E118 Type 2 diabetes mellitus with unspecified complications: Secondary | ICD-10-CM | POA: Diagnosis not present

## 2021-12-11 DIAGNOSIS — K449 Diaphragmatic hernia without obstruction or gangrene: Secondary | ICD-10-CM | POA: Diagnosis not present

## 2021-12-11 DIAGNOSIS — M5136 Other intervertebral disc degeneration, lumbar region: Secondary | ICD-10-CM | POA: Diagnosis not present

## 2021-12-11 DIAGNOSIS — E1165 Type 2 diabetes mellitus with hyperglycemia: Secondary | ICD-10-CM | POA: Diagnosis not present

## 2021-12-11 DIAGNOSIS — Z96652 Presence of left artificial knee joint: Secondary | ICD-10-CM | POA: Diagnosis not present

## 2021-12-11 DIAGNOSIS — Z85828 Personal history of other malignant neoplasm of skin: Secondary | ICD-10-CM | POA: Diagnosis not present

## 2021-12-11 DIAGNOSIS — Z886 Allergy status to analgesic agent status: Secondary | ICD-10-CM | POA: Diagnosis not present

## 2021-12-11 DIAGNOSIS — Z9049 Acquired absence of other specified parts of digestive tract: Secondary | ICD-10-CM | POA: Diagnosis not present

## 2021-12-11 DIAGNOSIS — M069 Rheumatoid arthritis, unspecified: Secondary | ICD-10-CM | POA: Diagnosis not present

## 2021-12-12 DIAGNOSIS — K5732 Diverticulitis of large intestine without perforation or abscess without bleeding: Secondary | ICD-10-CM | POA: Diagnosis not present

## 2021-12-13 DIAGNOSIS — K5732 Diverticulitis of large intestine without perforation or abscess without bleeding: Secondary | ICD-10-CM | POA: Diagnosis not present

## 2021-12-13 DIAGNOSIS — E876 Hypokalemia: Secondary | ICD-10-CM | POA: Diagnosis not present

## 2021-12-13 DIAGNOSIS — E119 Type 2 diabetes mellitus without complications: Secondary | ICD-10-CM | POA: Diagnosis not present

## 2021-12-13 DIAGNOSIS — E871 Hypo-osmolality and hyponatremia: Secondary | ICD-10-CM | POA: Diagnosis not present

## 2021-12-13 DIAGNOSIS — R112 Nausea with vomiting, unspecified: Secondary | ICD-10-CM | POA: Diagnosis not present

## 2021-12-14 ENCOUNTER — Ambulatory Visit: Payer: Self-pay

## 2021-12-14 ENCOUNTER — Encounter: Payer: Self-pay | Admitting: Sports Medicine

## 2021-12-14 ENCOUNTER — Telehealth: Payer: Self-pay

## 2021-12-14 ENCOUNTER — Other Ambulatory Visit: Payer: Self-pay | Admitting: Family Medicine

## 2021-12-14 ENCOUNTER — Encounter: Payer: Self-pay | Admitting: Family Medicine

## 2021-12-14 ENCOUNTER — Other Ambulatory Visit: Payer: Self-pay | Admitting: *Deleted

## 2021-12-14 DIAGNOSIS — E871 Hypo-osmolality and hyponatremia: Secondary | ICD-10-CM

## 2021-12-14 DIAGNOSIS — E876 Hypokalemia: Secondary | ICD-10-CM

## 2021-12-14 MED ORDER — DIAZEPAM 5 MG PO TABS: ORAL_TABLET | ORAL | 0 refills | Status: DC

## 2021-12-14 NOTE — Telephone Encounter (Signed)
Get her in with [place] when Madilyn Fireman gets back in to the office.

## 2021-12-14 NOTE — Patient Outreach (Signed)
  Care Coordination [facility] Note Transition Care Management Unsuccessful Follow-up Telephone Call  Date of discharge and from where:  [facility] 12/13/21  Attempts:  2nd Attempt  Reason for unsuccessful TCM follow-up call:  Unable to leave message

## 2021-12-14 NOTE — Telephone Encounter (Signed)
Routing to covering provider. Labs ordered for recheck in one week. Diagnosis associated with labs are: hyponatremia and hypokalemia. Thanks in advance.

## 2021-12-14 NOTE — Telephone Encounter (Signed)
Routing to covering provider.   Last OV: 11/30/21 Next OV: 12/23/21 (hospital f/u, recheck labs) Last RF: 11/14/21

## 2021-12-14 NOTE — Patient Outreach (Signed)
Error

## 2021-12-14 NOTE — Telephone Encounter (Signed)
Patient has been scheduled for HFU with PCP on 12/23/21. AMUCK

## 2021-12-14 NOTE — Patient Outreach (Signed)
  Care Coordination [facility] Note Transition Care Management Unsuccessful Follow-up Telephone Call  Date of discharge and from where:  12/13/21 [facility]  Attempts:  1st Attempt  Reason for unsuccessful TCM follow-up call:  Left voice message.  Emelia Loron RN, BSN [place] [phone] [email or web address]

## 2021-12-15 ENCOUNTER — Other Ambulatory Visit: Payer: Self-pay | Admitting: *Deleted

## 2021-12-15 NOTE — Patient Outreach (Signed)
  Care Coordination [facility] Note Transition Care Management Follow-up Telephone Call Date of discharge and from where: 12/13/21 [facility] How have you been since you were released from the hospital? I feel a lot better but continue to feel weak.  Any questions or concerns? No  Items Reviewed: Did the pt receive and understand the discharge instructions provided? Yes  Medications obtained and verified? Yes  Other? No  Any new allergies since your discharge? No  Dietary orders reviewed? Yes Do you have support at home? Yes   Home Care and Equipment/Supplies: Were home health services ordered? no If so, what is the name of the agency? N/A  Has the agency set up a time to come to the patient's home? not applicable Were any new equipment or medical supplies ordered?  No What is the name of the medical supply agency? N/A Were you able to get the supplies/equipment? not applicable Do you have any questions related to the use of the equipment or supplies? No  Functional Questionnaire: (I = Independent and D = Dependent) ADLs: I  Bathing/Dressing- I  Meal Prep- I  Eating- I  Maintaining continence- I  Transferring/Ambulation- I  Managing Meds- I  Follow up appointments reviewed:  PCP Hospital f/u appt confirmed? Yes  Scheduled to see Dr. Madilyn Fireman on 12/20/21 @ 1130. [place] [facility] f/u appt confirmed? No   Are transportation arrangements needed? No  If their condition worsens, is the pt aware to call PCP or go to the Emergency Dept.? Yes Was the patient provided with contact information for the PCP's office or ED? Yes Was to pt encouraged to call back with questions or concerns? Yes  SDOH assessments and interventions completed:   Yes  Care Coordination Interventions Activated:  No   Care Coordination Interventions:   N/A     Encounter Outcome:  Pt. Visit Completed    Emelia Loron RN, BSN [place] [phone] [email or web address]

## 2021-12-20 ENCOUNTER — Encounter: Payer: Self-pay | Admitting: Family Medicine

## 2021-12-20 ENCOUNTER — Ambulatory Visit (INDEPENDENT_AMBULATORY_CARE_PROVIDER_SITE_OTHER): Payer: Medicare Other | Admitting: Family Medicine

## 2021-12-20 VITALS — BP 134/80 | HR 88 | Ht 66.0 in | Wt 268.0 lb

## 2021-12-20 DIAGNOSIS — E876 Hypokalemia: Secondary | ICD-10-CM

## 2021-12-20 DIAGNOSIS — K5730 Diverticulosis of large intestine without perforation or abscess without bleeding: Secondary | ICD-10-CM | POA: Diagnosis not present

## 2021-12-20 DIAGNOSIS — E871 Hypo-osmolality and hyponatremia: Secondary | ICD-10-CM | POA: Diagnosis not present

## 2021-12-20 DIAGNOSIS — R197 Diarrhea, unspecified: Secondary | ICD-10-CM

## 2021-12-20 NOTE — Progress Notes (Signed)
Established Patient Office Visit  Subjective   Patient ID: Ashley Gomez, female    DOB: 1970/05/18  Age: 51 y.o. MRN: [id number]  Chief Complaint  Patient presents with   Hospitalization Follow-up    HPI  Here today for hospital follow-up.  She was admitted at [facility] on July 30 for diverticulitis of the sigmoid colon.  She was discharged home 2 days later on August 1.  She was discharged home on Cipro 500 mg twice daily for 11 days and metronidazole 3 times daily for 11 days.  She was treated with 3 days of IV Cipro and Flagyl while hospitalized.  She also had hyponatremia and potassium was repleted and she was given 7 more days of potassium supplementation.  He says she is feeling significantly better from when she was hospitalized she still having some intermittent soreness particularly in that right lower quadrant.  Definitely if she coughs she gets a sharp pain in that area.  She also just feels extremely tired but again in general she is feeling much better.  She usually tries to put pressure on the area if she does need to cough etc.  During the hospitalization she was noted to have significant hyponatremia, hypokalemia and hypomagnesia.  They did replete her stores she is not currently taking a magnesium supplement.  Is also having some difficulty with her leg but is following up with Dr. Darene Lamer for that.  She is using a walker today.  Her Darcel Bayley was held while she was in the hospital.  Her appetite really has not come back much.  She has been eating very little and very bland foods.  Hospital notes reviewed.  Down 8 lbs. Having watery loose green stools.  Having some stool urgency.    ROS    Objective:     BP 134/80   Pulse 88   Ht '5\' 6"'$  (1.676 m)   Wt 268 lb (121.6 kg)   LMP 12/28/2014   SpO2 99%   BMI 43.26 kg/m    Physical Exam   No results found for any visits on 12/20/21.    The 10-year ASCVD risk score (Arnett DK, et al., 2019) is: 14.3%     Assessment & Plan:   Problem List Items Addressed This Visit       Digestive   Diverticulosis of colon - Primary   Relevant Orders   BASIC METABOLIC PANEL WITH GFR   Magnesium   Other Visit Diagnoses     Hypomagnesemia       Relevant Orders   BASIC METABOLIC PANEL WITH GFR   Magnesium   Hyponatremia       Relevant Orders   BASIC METABOLIC PANEL WITH GFR   Magnesium   Hypokalemia       Relevant Orders   BASIC METABOLIC PANEL WITH GFR   Magnesium   Diarrhea, unspecified type          Diverticulitis-still some intermittent pain and tenderness in the right lower quadrant but overall she is feeling much better.  Urged her to advance diet slowly just eating very small amounts and then letting it sit for half an hour to see how she feels.  Void anything greasy or spicy.  She does not have to avoid seeds.  We discussed increasing fiber in the diet with vegetables.  Make sure to complete any remaining antibiotics.  Abnormal electrolyte levels-plan to recheck those today.  Low magnesium-we will plan to recheck today as well.  Diarrhea-seems  to be gradually improving.  [place] with bland diet and make sure hydrating well.  No follow-ups on file.   I spent 40 minutes on the day of the encounter to include pre-visit record review, face-to-face time with the patient and post visit ordering of test.   Beatrice Lecher, MD

## 2021-12-21 DIAGNOSIS — M7061 Trochanteric bursitis, right hip: Secondary | ICD-10-CM | POA: Diagnosis not present

## 2021-12-21 DIAGNOSIS — M5459 Other low back pain: Secondary | ICD-10-CM | POA: Diagnosis not present

## 2021-12-21 DIAGNOSIS — I10 Essential (primary) hypertension: Secondary | ICD-10-CM | POA: Diagnosis not present

## 2021-12-21 DIAGNOSIS — M47816 Spondylosis without myelopathy or radiculopathy, lumbar region: Secondary | ICD-10-CM | POA: Diagnosis not present

## 2021-12-21 DIAGNOSIS — M5136 Other intervertebral disc degeneration, lumbar region: Secondary | ICD-10-CM | POA: Diagnosis not present

## 2021-12-21 DIAGNOSIS — G894 Chronic pain syndrome: Secondary | ICD-10-CM | POA: Diagnosis not present

## 2021-12-23 ENCOUNTER — Inpatient Hospital Stay: Payer: Medicare Other | Admitting: Family Medicine

## 2021-12-26 DIAGNOSIS — G8929 Other chronic pain: Secondary | ICD-10-CM | POA: Diagnosis not present

## 2021-12-26 DIAGNOSIS — Z79891 Long term (current) use of opiate analgesic: Secondary | ICD-10-CM | POA: Diagnosis not present

## 2021-12-26 DIAGNOSIS — G35 Multiple sclerosis: Secondary | ICD-10-CM | POA: Diagnosis not present

## 2021-12-26 DIAGNOSIS — M545 Low back pain, unspecified: Secondary | ICD-10-CM | POA: Diagnosis not present

## 2021-12-26 DIAGNOSIS — Z79899 Other long term (current) drug therapy: Secondary | ICD-10-CM | POA: Diagnosis not present

## 2021-12-27 ENCOUNTER — Encounter: Payer: Medicare Other | Admitting: Obstetrics and Gynecology

## 2022-01-04 DIAGNOSIS — M7061 Trochanteric bursitis, right hip: Secondary | ICD-10-CM | POA: Diagnosis not present

## 2022-01-05 ENCOUNTER — Ambulatory Visit: Payer: Medicare Other | Admitting: Sports Medicine

## 2022-01-11 DIAGNOSIS — K5900 Constipation, unspecified: Secondary | ICD-10-CM | POA: Diagnosis not present

## 2022-01-11 DIAGNOSIS — I10 Essential (primary) hypertension: Secondary | ICD-10-CM | POA: Diagnosis not present

## 2022-01-11 DIAGNOSIS — K5732 Diverticulitis of large intestine without perforation or abscess without bleeding: Secondary | ICD-10-CM | POA: Diagnosis not present

## 2022-01-13 ENCOUNTER — Other Ambulatory Visit: Payer: Self-pay | Admitting: Sports Medicine

## 2022-01-13 DIAGNOSIS — M19022 Primary osteoarthritis, left elbow: Secondary | ICD-10-CM

## 2022-01-19 ENCOUNTER — Other Ambulatory Visit: Payer: Self-pay | Admitting: Sports Medicine

## 2022-01-19 NOTE — Telephone Encounter (Signed)
To PCP

## 2022-01-20 DIAGNOSIS — E559 Vitamin D deficiency, unspecified: Secondary | ICD-10-CM | POA: Diagnosis not present

## 2022-01-20 DIAGNOSIS — G8929 Other chronic pain: Secondary | ICD-10-CM | POA: Diagnosis not present

## 2022-01-20 DIAGNOSIS — E119 Type 2 diabetes mellitus without complications: Secondary | ICD-10-CM | POA: Diagnosis not present

## 2022-01-20 DIAGNOSIS — F17210 Nicotine dependence, cigarettes, uncomplicated: Secondary | ICD-10-CM | POA: Diagnosis not present

## 2022-01-20 DIAGNOSIS — Z888 Allergy status to other drugs, medicaments and biological substances status: Secondary | ICD-10-CM | POA: Diagnosis not present

## 2022-01-20 DIAGNOSIS — Z85828 Personal history of other malignant neoplasm of skin: Secondary | ICD-10-CM | POA: Diagnosis not present

## 2022-01-20 DIAGNOSIS — Z96652 Presence of left artificial knee joint: Secondary | ICD-10-CM | POA: Diagnosis not present

## 2022-01-20 DIAGNOSIS — Z885 Allergy status to narcotic agent status: Secondary | ICD-10-CM | POA: Diagnosis not present

## 2022-01-20 DIAGNOSIS — Z86718 Personal history of other venous thrombosis and embolism: Secondary | ICD-10-CM | POA: Diagnosis not present

## 2022-01-20 DIAGNOSIS — M4716 Other spondylosis with myelopathy, lumbar region: Secondary | ICD-10-CM | POA: Diagnosis not present

## 2022-01-20 DIAGNOSIS — M47816 Spondylosis without myelopathy or radiculopathy, lumbar region: Secondary | ICD-10-CM | POA: Diagnosis not present

## 2022-01-20 DIAGNOSIS — E785 Hyperlipidemia, unspecified: Secondary | ICD-10-CM | POA: Diagnosis not present

## 2022-01-20 DIAGNOSIS — M797 Fibromyalgia: Secondary | ICD-10-CM | POA: Diagnosis not present

## 2022-01-20 DIAGNOSIS — I10 Essential (primary) hypertension: Secondary | ICD-10-CM | POA: Diagnosis not present

## 2022-01-20 DIAGNOSIS — Z886 Allergy status to analgesic agent status: Secondary | ICD-10-CM | POA: Diagnosis not present

## 2022-01-20 DIAGNOSIS — Z91048 Other nonmedicinal substance allergy status: Secondary | ICD-10-CM | POA: Diagnosis not present

## 2022-01-20 DIAGNOSIS — M5459 Other low back pain: Secondary | ICD-10-CM | POA: Diagnosis not present

## 2022-01-20 DIAGNOSIS — M069 Rheumatoid arthritis, unspecified: Secondary | ICD-10-CM | POA: Diagnosis not present

## 2022-01-20 DIAGNOSIS — G4733 Obstructive sleep apnea (adult) (pediatric): Secondary | ICD-10-CM | POA: Diagnosis not present

## 2022-01-20 DIAGNOSIS — M159 Polyosteoarthritis, unspecified: Secondary | ICD-10-CM | POA: Diagnosis not present

## 2022-01-20 DIAGNOSIS — M4726 Other spondylosis with radiculopathy, lumbar region: Secondary | ICD-10-CM | POA: Diagnosis not present

## 2022-01-20 DIAGNOSIS — Z7985 Long-term (current) use of injectable non-insulin antidiabetic drugs: Secondary | ICD-10-CM | POA: Diagnosis not present

## 2022-01-20 DIAGNOSIS — Z79899 Other long term (current) drug therapy: Secondary | ICD-10-CM | POA: Diagnosis not present

## 2022-01-20 DIAGNOSIS — G35 Multiple sclerosis: Secondary | ICD-10-CM | POA: Diagnosis not present

## 2022-01-30 ENCOUNTER — Ambulatory Visit (INDEPENDENT_AMBULATORY_CARE_PROVIDER_SITE_OTHER): Payer: Medicare Other

## 2022-01-30 ENCOUNTER — Ambulatory Visit: Payer: Medicare Other

## 2022-01-30 DIAGNOSIS — M160 Bilateral primary osteoarthritis of hip: Secondary | ICD-10-CM

## 2022-01-30 DIAGNOSIS — Z96641 Presence of right artificial hip joint: Secondary | ICD-10-CM | POA: Diagnosis not present

## 2022-01-30 DIAGNOSIS — Z79891 Long term (current) use of opiate analgesic: Secondary | ICD-10-CM | POA: Diagnosis not present

## 2022-01-30 DIAGNOSIS — Z79899 Other long term (current) drug therapy: Secondary | ICD-10-CM | POA: Diagnosis not present

## 2022-01-30 DIAGNOSIS — I10 Essential (primary) hypertension: Secondary | ICD-10-CM | POA: Diagnosis not present

## 2022-01-30 DIAGNOSIS — M1611 Unilateral primary osteoarthritis, right hip: Secondary | ICD-10-CM | POA: Diagnosis not present

## 2022-01-30 DIAGNOSIS — R600 Localized edema: Secondary | ICD-10-CM | POA: Diagnosis not present

## 2022-01-30 DIAGNOSIS — G8929 Other chronic pain: Secondary | ICD-10-CM | POA: Diagnosis not present

## 2022-01-30 DIAGNOSIS — M545 Low back pain, unspecified: Secondary | ICD-10-CM | POA: Diagnosis not present

## 2022-01-30 DIAGNOSIS — M25451 Effusion, right hip: Secondary | ICD-10-CM | POA: Diagnosis not present

## 2022-01-31 ENCOUNTER — Encounter (INDEPENDENT_AMBULATORY_CARE_PROVIDER_SITE_OTHER): Payer: Medicare Other | Admitting: Sports Medicine

## 2022-01-31 DIAGNOSIS — M160 Bilateral primary osteoarthritis of hip: Secondary | ICD-10-CM | POA: Diagnosis not present

## 2022-01-31 NOTE — Telephone Encounter (Signed)
I spent 5 total minutes of online digital evaluation and management services in this patient-initiated request for online care. 

## 2022-02-02 ENCOUNTER — Encounter: Payer: Self-pay | Admitting: Sports Medicine

## 2022-02-13 ENCOUNTER — Ambulatory Visit (HOSPITAL_COMMUNITY): Payer: Medicare Other | Admitting: Licensed Clinical Social Worker

## 2022-02-15 DIAGNOSIS — M1611 Unilateral primary osteoarthritis, right hip: Secondary | ICD-10-CM | POA: Diagnosis not present

## 2022-02-15 DIAGNOSIS — I10 Essential (primary) hypertension: Secondary | ICD-10-CM | POA: Diagnosis not present

## 2022-02-15 DIAGNOSIS — R52 Pain, unspecified: Secondary | ICD-10-CM | POA: Diagnosis not present

## 2022-02-15 DIAGNOSIS — M47816 Spondylosis without myelopathy or radiculopathy, lumbar region: Secondary | ICD-10-CM | POA: Diagnosis not present

## 2022-02-20 ENCOUNTER — Other Ambulatory Visit: Payer: Medicare Other

## 2022-02-21 ENCOUNTER — Encounter: Payer: Self-pay | Admitting: Family Medicine

## 2022-02-21 MED ORDER — DIAZEPAM 5 MG PO TABS: 5.0000 mg | ORAL_TABLET | Freq: Every day | ORAL | 3 refills | Status: DC | PRN

## 2022-02-21 NOTE — Telephone Encounter (Signed)
Meds ordered this encounter  Medications   diazepam (VALIUM) 5 MG tablet    Sig: Take 1 tablet (5 mg total) by mouth daily as needed for anxiety.    Dispense:  30 tablet    Refill:  3    Not to exceed 5 additional fills before 06/12/2022

## 2022-02-23 ENCOUNTER — Encounter: Payer: Self-pay | Admitting: Family Medicine

## 2022-02-27 ENCOUNTER — Telehealth: Payer: Self-pay

## 2022-02-27 ENCOUNTER — Ambulatory Visit (INDEPENDENT_AMBULATORY_CARE_PROVIDER_SITE_OTHER): Payer: Medicare Other | Admitting: Licensed Clinical Social Worker

## 2022-02-27 DIAGNOSIS — G894 Chronic pain syndrome: Secondary | ICD-10-CM

## 2022-02-27 DIAGNOSIS — F331 Major depressive disorder, recurrent, moderate: Secondary | ICD-10-CM | POA: Diagnosis not present

## 2022-02-27 DIAGNOSIS — F4321 Adjustment disorder with depressed mood: Secondary | ICD-10-CM | POA: Diagnosis not present

## 2022-02-27 DIAGNOSIS — F4310 Post-traumatic stress disorder, unspecified: Secondary | ICD-10-CM

## 2022-02-27 DIAGNOSIS — F411 Generalized anxiety disorder: Secondary | ICD-10-CM

## 2022-02-27 NOTE — Progress Notes (Signed)
Virtual Visit via Telephone Note  I connected with Ashley Gomez on 02/27/22 at  2:00 PM EDT by telephone and verified that I am speaking with the correct person using two identifiers.  Location: Patient: home Provider: office  I discussed the limitations, risks, security and privacy concerns of performing an evaluation and management service by telephone and the availability of in person appointments. I also discussed with the patient that there may be a patient responsible charge related to this service. The patient expressed understanding and agreed to proceed.  I discussed the assessment and treatment plan with the patient. The patient was provided an opportunity to ask questions and all were answered. The patient agreed with the plan and demonstrated an understanding of the instructions.   The patient was advised to call back or seek an in-person evaluation if the symptoms worsen or if the condition fails to improve as anticipated.  I provided 53 minutes of non-face-to-face time during this encounter.  THERAPIST PROGRESS NOTE  Session Time: 2:00 PM to 2:53 PM  Participation Level: Active  Behavioral Response: CasualAlertAnxious and Dysphoric  Type of Therapy: Individual Therapy  Treatment Goals addressed: Patient notes depression better but continue to work on coping skills, continue to work on Mudlogger of anxiety and anxiety attacks, trauma, grief, stress management coping  ProgressTowards Goals: Progressing-patient describes active symptoms of depression anxiety assess helpful for her to process feelings and thoughts in session  Interventions: Solution Focused, Strength-based, and Other: coping  Summary: Ashley Gomez is a 51 y.o. female who presents with not feeling good lately. Tired all the time, can't sleep at night. Found out need a hip replacement hurt so bad with hip and back.  She had a nerve ablation  doctor took her to OR put under. Felt good for a couple of days  a lot of it coming from hip. Dr. Darene Lamer did MRI on hip. All had to do was x-ray and can see bone on bone. Goes to see hip doctor Dr. Laurance Flatten went to him once and go back the 8th. Wants her to stop smoking lose, patient lost 60 lbs wants her to lose another 10-12 lbs more studies show if you weight is lower and don't smoke better recovery less change for infection. Patient says "a bunch of BS I don't care what studies say this is what I am saying." What are you going to do she asks make her wait and suffer? Wants to get surgery done and points out being 51 walking with cane. Doesn't sleep hurts so bad. Has pain medicine doesn't last but 5-6 hours. If she is mobile try to walk around the house do things the more on it- once pain meds wear off it is excruciating pain. Only supposed to take pain every 8 hours pain doctors told her could take every 6 hours. Goes Wed for refills. Told doctor ready to stop taking meds he said won't be easy meds that she is on is a really strong one. Between pain and everything else such as her aunt. Aunt talking to her again. Mom hasn't talked to patient now 3 years. Mom's sister was more like a Mom she quit talking when Mom stopped. They talked every day see each other a lot. Being around her and talking to her has brought up a lot of thoughts. The way she had to grow up different things that happened growing up. Patient was in hospital end of July for three days. Didn't think coming home diverticulitis  really bad. Has never been in so much pain. Laying in pain for 4 days until aunt took to the hospital. Was on so many antibiotics blood thinners in stomach so wouldn't get a blood clot getting up to the bathroom it was awful. Not feeling well so patient shares she stays depressed. Losing weight a good thing but wants weight for cushion didn't hurt as bad. Would lose more weight if more mobile. Aunt lost her daughter Dec 28 will be a year. Her son's wife Mother's day found out she has cancer. She  is not doing good. Going out in public and sweating right away. It is her anxiety sky high think after a couple minutes all these people around and things closing in on her. Asking to sleep for  years and doctors say on enough stuff. Takes 1 Valium a day as needed. Aunt will get mad if patient doesn't call and patient says phone line goes both ways not going to be the one to put more effort into it. A lot of different things patient can point to in last few weeks and relates she has been by herself all this time and can be that way again. It is when patient says hurting too bad and can't go with her and aunt will say something almost like patient doing it on purpose. Patient relates this is a hard time for her from September until beginning of year- holidays are hard. Lost [name] relive that in September her birthday in October. Doesn't have anyone to talk to about it.Therapy helping her see things from different perspective and has been helpful. Both about herself now and what she has been through   Patient able to identify very helpful aspect for her for therapy is being able to see things from different perspective therapist added these of the ways we grow, these perspectives to help [name] with coping.  Reviewed patient's medical issues significant stressor validated patient on difficulty of pain issues guided patient and realizing after surgery more than likely symptoms should improve.  Noted patient's thoughts going to the past, does see communicating with aunt is having impact so explored what would be healthy for patient prep setting a boundary.  Additionally on has a negative aspect and communication another reason perhaps to set more of a boundary.  Noted helpfulness of therapy having a place to talk about things where she does not have anyone else to talk to such as loss of [name].  Noted progress for patient of standing up for herself therapist is known patient for well and can see these positive changes  in her.  Therapist provided space and support for patient to talk about thoughts and feelings in session. Suicidal/Homicidal: No  Plan: Return again in 2-3 weeks.2.Reviewed treatment issues as needed as well as processing feelings to help with stressors and coping   Diagnosis: Generalized anxiety disorder PTSD, complicated grief, chronic pain syndrome, Major depressive disorder, recurrent moderate  Collaboration of Care: Other none needed  Patient/Guardian was advised Release of Information must be obtained prior to any record release in order to collaborate their care with an outside provider. Patient/Guardian was advised if they have not already done so to contact the registration department to sign all necessary forms in order for [name] to release information regarding their care.   Consent: Patient/Guardian gives verbal consent for treatment and assignment of benefits for services provided during this visit. Patient/Guardian expressed understanding and agreed to proceed.   Cordella Register, LCSW 02/27/2022

## 2022-02-27 NOTE — Patient Outreach (Signed)
  Care Coordination   02/27/2022 Name: Ashley Gomez MRN: [id number] DOB: [birth year]   Care Coordination Outreach Attempts:  An unsuccessful telephone outreach was attempted today to offer the patient information about available care coordination services as a benefit of their health plan.   Follow Up Plan:  Additional outreach attempts will be made to offer the patient care coordination information and services.   Encounter Outcome:  No Answer  Care Coordination Interventions Activated:  No   Care Coordination Interventions:  No, not indicated    Thea Silversmith, RN, MSN, BSN, [place] Coordinator [phone]

## 2022-03-01 DIAGNOSIS — Z79899 Other long term (current) drug therapy: Secondary | ICD-10-CM | POA: Diagnosis not present

## 2022-03-01 DIAGNOSIS — Z79891 Long term (current) use of opiate analgesic: Secondary | ICD-10-CM | POA: Diagnosis not present

## 2022-03-01 DIAGNOSIS — G8929 Other chronic pain: Secondary | ICD-10-CM | POA: Diagnosis not present

## 2022-03-01 DIAGNOSIS — M545 Low back pain, unspecified: Secondary | ICD-10-CM | POA: Diagnosis not present

## 2022-03-01 DIAGNOSIS — I10 Essential (primary) hypertension: Secondary | ICD-10-CM | POA: Diagnosis not present

## 2022-03-06 ENCOUNTER — Telehealth (INDEPENDENT_AMBULATORY_CARE_PROVIDER_SITE_OTHER): Payer: Medicare Other | Admitting: Psychiatry

## 2022-03-06 ENCOUNTER — Encounter (HOSPITAL_COMMUNITY): Payer: Self-pay | Admitting: Psychiatry

## 2022-03-06 DIAGNOSIS — F411 Generalized anxiety disorder: Secondary | ICD-10-CM | POA: Diagnosis not present

## 2022-03-06 DIAGNOSIS — F5102 Adjustment insomnia: Secondary | ICD-10-CM

## 2022-03-06 DIAGNOSIS — G894 Chronic pain syndrome: Secondary | ICD-10-CM | POA: Diagnosis not present

## 2022-03-06 DIAGNOSIS — F4321 Adjustment disorder with depressed mood: Secondary | ICD-10-CM | POA: Diagnosis not present

## 2022-03-06 DIAGNOSIS — F331 Major depressive disorder, recurrent, moderate: Secondary | ICD-10-CM | POA: Diagnosis not present

## 2022-03-06 MED ORDER — DULOXETINE HCL 60 MG PO CPEP: 60.0000 mg | ORAL_CAPSULE | Freq: Every day | ORAL | 0 refills | Status: DC

## 2022-03-06 MED ORDER — DULOXETINE HCL 30 MG PO CPEP: ORAL_CAPSULE | ORAL | 0 refills | Status: DC

## 2022-03-06 MED ORDER — BUSPIRONE HCL 7.5 MG PO TABS: 7.5000 mg | ORAL_TABLET | Freq: Two times a day (BID) | ORAL | 0 refills | Status: DC | PRN

## 2022-03-06 NOTE — Progress Notes (Signed)
Follow up tele psych  visit   Patient Identification: Ashley Gomez MRN:  [id number] Date of Evaluation:  03/06/2022 Referral Source: Mary . Counsellor      Chief Complaint:   depression follow up   Visit Diagnosis:    ICD-10-CM   1. Generalized anxiety disorder  F41.1 DULoxetine (CYMBALTA) 30 MG capsule    2. Depression, major, recurrent, moderate (HCC)  F33.1 DULoxetine (CYMBALTA) 30 MG capsule    3. Chronic pain syndrome  G89.4     4. Complicated grief  E23.36     5. Adjustment insomnia  F51.02     Virtual Visit via Video Note  I connected with Ashley Gomez on 03/06/22 at 10:30 AM EDT by a video enabled telemedicine application and verified that I am speaking with the correct person using two identifiers.  Location: Patient: home Provider: home office   I discussed the limitations of evaluation and management by telemedicine and the availability of in person appointments. The patient expressed understanding and agreed to proceed.      I discussed the assessment and treatment plan with the patient. The patient was provided an opportunity to ask questions and all were answered. The patient agreed with the plan and demonstrated an understanding of the instructions.   The patient was advised to call back or seek an in-person evaluation if the symptoms worsen or if the condition fails to improve as anticipated.  I provided 15 minutes  of non-face-to-face time during this encounter.     History of Present Illness: 51  years old currently single Caucasian female initially referred by her counselor for management of depression anxiety possible PTSD  Has a herniated disc Now going or planning for knee surgery, stress related to that but meds keep some balance   Feels meds are doing what they  can Grief manageable  On pain meds for management   Modifying factors; grand kids Aggravating factor:daughters past death  Severity pain effects  mood   Past Psychiatric History: depression, anxiety  Previous Psychotropic Medications: Yes   Substance Abuse History in the last 12 months:  No.  Consequences of Substance Abuse: NA  Past Medical History:  Past Medical History:  Diagnosis Date   Anxiety    Arthritis    CVA (cerebral infarction)    Old CVA on MRI brain   Depression    DVT (deep venous thrombosis) ([place]) 05/07/4974   L basilic vein    Facet hypertrophy of lumbar region    MRI 2007   Fibromyalgia    GERD (gastroesophageal reflux disease)    Hypertension    Migraines    MS (multiple sclerosis) ([place])    Neuromuscular disorder ([place])    Obesity    Pre-diabetes    PTSD (post-traumatic stress disorder)    Seizures ([place]) 2011   no seizure since onset   Smoking    Stroke [facility])    TIA, >8years ago    Past Surgical History:  Procedure Laterality Date   CHOLECYSTECTOMY  10/2017   CHONDROPLASTY Left 12/30/2014   Procedure: CHONDROPLASTY;  Surgeon: Leandrew Koyanagi, MD;  Location: [place];  Service: Orthopedics;  Laterality: Left;   KNEE ARTHROSCOPY Left 12/30/2014   Procedure: LEFT KNEE ARTHROSCOPY WITH   CHONDROPLASTY;  Surgeon: Leandrew Koyanagi, MD;  Location: [place];  Service: Orthopedics;  Laterality: Left;   PARTIAL KNEE ARTHROPLASTY Left 08/03/2016   Procedure: LEFT  UNICOMPARTMENTAL KNEE ARTHROPLASTY;  Surgeon: Leandrew Koyanagi, MD;  Location: [place];  Service: Orthopedics;  Laterality: Left;   SHOULDER ARTHROSCOPY WITH BICEPSTENOTOMY Left 01/15/2020   Procedure: SHOULDER ARTHROSCOPY WITH BICEPSTENOTOMY;  Surgeon: Hiram Gash, MD;  Location: [place];  Service: Orthopedics;  Laterality: Left;   SHOULDER ARTHROSCOPY WITH DISTAL CLAVICLE RESECTION Left 01/15/2020   Procedure: SHOULDER ARTHROSCOPY WITH DISTAL CLAVICULECTOMY;  Surgeon: Hiram Gash, MD;  Location: [place];  Service: Orthopedics;  Laterality: Left;   SHOULDER ARTHROSCOPY WITH ROTATOR CUFF  REPAIR AND SUBACROMIAL DECOMPRESSION Left 01/15/2020   Procedure: SHOULDER ARTHROSCOPY DEBRIDMENT WITH ROTATOR CUFF REPAIR, SUBACROMIAL DECOMPRESSION WITH ACROMIPLASTY, AND BICEP TENOTOMY;  Surgeon: Hiram Gash, MD;  Location: [place];  Service: Orthopedics;  Laterality: Left;   TONSILLECTOMY     TUBAL LIGATION  1992    Family Psychiatric History: Parents: alcohol use disorder  Family History:  Family History  Problem Relation Age of Onset   Cancer Mother 10       melanoma   Hypertension Sister    Heart disease Sister        valve disease   Depression Sister    Diabetes Sister    Sjogren's syndrome Sister     Social History:   Social History   Socioeconomic History   Marital status: Divorced    Spouse name: Not on file   Number of children: 3   Years of education: 12   Highest education level: GED or equivalent  Occupational History   Occupation: Legally disabled  Tobacco Use   Smoking status: Every Day    Packs/day: 1.00    Years: 25.00    Total pack years: 25.00    Types: Cigarettes   Smokeless tobacco: Never   Tobacco comments:    Patient currently smoking 2-3 cigarettes a day   Vaping Use   Vaping Use: Never used  Substance and Sexual Activity   Alcohol use: No    Alcohol/week: 0.0 standard drinks of alcohol   Drug use: No   Sexual activity: Yes    Birth control/protection: None, Post-menopausal  Other Topics Concern   Not on file  Social History Narrative   Her son lives with her after a car accident that he was involved in. She had three children, one has deceased. She enjoys reading, watching television and spending time with her children and grandchildren.   Social Determinants of Health   Financial Resource Strain: Low Risk  (04/29/2021)   Overall Financial Resource Strain (CARDIA)    Difficulty of Paying Living Expenses: Not hard at all  Food Insecurity: No Food Insecurity (04/29/2021)   Hunger Vital Sign    Worried About  Running Out of Food in the Last Year: Never true    Ran Out of Food in the Last Year: Never true  Transportation Needs: No Transportation Needs (04/29/2021)   PRAPARE - Hydrologist (Medical): No    Lack of Transportation (Non-Medical): No  Physical Activity: Inactive (04/29/2021)   Exercise Vital Sign    Days of Exercise per Week: 0 days    Minutes of Exercise per Session: 0 min  Stress: Stress Concern Present (04/29/2021)   [place]    Feeling of Stress : Rather much  Social Connections: Socially Isolated (04/29/2021)   Social Connection and Isolation Panel [NHANES]    Frequency of Communication with Friends and Family: More  than three times a week    Frequency of Social Gatherings with Friends and Family: Once a week    Attends Religious Services: Never    Marine scientist or Organizations: No    Attends Archivist Meetings: Never    Marital Status: Divorced      Allergies:   Allergies  Allergen Reactions   Celebrex [Celecoxib] Other (See Comments)    Swelling in extremities   Phenytoin Swelling   Topamax [Topiramate] Other (See Comments)    She reports that this makes her forgetful and causes her to studder   Nucynta [Tapentadol] Other (See Comments)    Headaches, abdominal pain    Ace Inhibitors Cough   Baclofen Nausea Only   Celexa [Citalopram Hydrobromide] Other (See Comments)    Dizziness Tolerated Lexapro    Codeine Nausea Only   Methocarbamol Nausea Only   Sumatriptan Nausea Only   Tizanidine Other (See Comments)    Keeps her awake.    Tramadol Anxiety and Palpitations   Triamcinolone Other (See Comments)    Steroid flare after joint injection, use other steroid for injections    Metabolic Disorder Labs: Lab Results  Component Value Date   HGBA1C 6.0 (H) 08/05/2021   MPG 126 08/05/2021   MPG 117 08/09/2020   Lab Results  Component Value  Date   PROLACTIN 4.3 03/04/2013   Lab Results  Component Value Date   CHOL 207 (H) 08/09/2020   TRIG 422 (H) 08/09/2020   HDL 33 (L) 08/09/2020   CHOLHDL 6.3 (H) 08/09/2020   VLDL 30 11/29/2016   [place]  08/09/2020     Comment:     . LDL cholesterol not calculated. Triglyceride levels greater than 400 mg/dL invalidate calculated LDL results. . Reference range: <100 . Desirable range <100 mg/dL for primary prevention;   <70 mg/dL for patients with CHD or diabetic patients  with > or = 2 CHD risk factors. Marland Kitchen LDL-C is now calculated using the Martin-Hopkins  calculation, which is a validated novel method providing  better accuracy than the Friedewald equation in the  estimation of LDL-C.  Cresenciano Genre et al. Annamaria Helling. 2878;676(72): 2061-2068  (http://education.[employer].com/faq/FAQ164)    LDLCALC 98 08/04/2019     Current Medications: Current Outpatient Medications  Medication Sig Dispense Refill   acetaminophen (TYLENOL) 500 MG tablet Take by mouth.     amLODipine (NORVASC) 5 MG tablet Take 1 tablet (5 mg total) by mouth daily. 90 tablet 1   atorvastatin (LIPITOR) 20 MG tablet TAKE 1 TABLET BY MOUTH EVERYDAY AT BEDTIME 90 tablet 3   busPIRone (BUSPAR) 7.5 MG tablet Take 1 tablet (7.5 mg total) by mouth 2 (two) times daily as needed. 180 tablet 0   chlorthalidone (HYGROTON) 25 MG tablet Take by mouth.     clindamycin (CLEOCIN T) 1 % external solution Apply topically 2 (two) times daily.     diazepam (VALIUM) 5 MG tablet Take 1 tablet (5 mg total) by mouth daily as needed for anxiety. 30 tablet 3   diclofenac Sodium (VOLTAREN) 1 % GEL Apply 4 g topically 4 (four) times daily. To affected joint. 100 g 11   DULoxetine (CYMBALTA) 30 MG capsule YAKE WITH 60MG . TOTAL DAILY DOSE IS  90 MG 90 capsule 0   DULoxetine (CYMBALTA) 60 MG capsule Take 1 capsule (60 mg total) by mouth daily. 90 capsule 0   famotidine (PEPCID) 20 MG tablet TAKE 1 TABLET BY MOUTH EVERY DAY 90 tablet 3  Glatiramer Acetate 40 MG/ML SOSY Inject into the skin.     HYDROmorphone (DILAUDID) 8 MG tablet Take 1 tablet by mouth every 8 (eight) hours as needed.     levocetirizine (XYZAL) 5 MG tablet Take 5 mg by mouth daily.     metoprolol succinate (TOPROL-XL) 25 MG 24 hr tablet TAKE 1 TABLET BY MOUTH EVERY DAY 90 tablet 1   mupirocin ointment (BACTROBAN) 2 % SMARTSIG:1 Application Topical 2-3 Times Daily     NARCAN 4 MG/0.1ML LIQD nasal spray kit USE AS DIRECTED     omeprazole (PRILOSEC) 40 MG capsule Take 40 mg by mouth daily.     ondansetron (ZOFRAN-ODT) 8 MG disintegrating tablet Take 8 mg by mouth every 8 (eight) hours as needed.     tirzepatide [facility]) 7.5 MG/0.5ML Pen Inject into the skin.     Vitamin D, Ergocalciferol, (DRISDOL) 1.25 MG (50000 UNIT) CAPS capsule Take 50,000 Units by mouth 2 (two) times a week.     No current facility-administered medications for this visit.     Psychiatric Specialty Exam: Review of Systems  Cardiovascular:  Negative for chest pain.  Neurological:  Negative for tremors.  Psychiatric/Behavioral:  Negative for depression.     Last menstrual period 12/28/2014.There is no height or weight on file to calculate BMI.  General Appearance: casual  Eye Contact:fair  Speech:  Normal Rate  Volume:  Decreased  Mood: fair, somewhat stressed  Affect:  Congruent  Thought Process:  Goal Directed  Orientation:  Full (Time, Place, and Person)  Thought Content:  Logical  Suicidal Thoughts:  No  Homicidal Thoughts:  No  Memory:  Immediate;   Fair Recent;   Fair  Judgement:  Fair  Insight:  Fair  Psychomotor Activity:   Concentration:  Concentration: Fair and Attention Span: Fair  Recall:  [employer] of Knowledge:Fair  Language: Fair  Akathisia:  No  Handed:  Right  AIMS (if indicated):    Assets:  Desire for Improvement  ADL's:  Intact  Cognition: WNL  Sleep:  fair    Treatment Plan Summary: Medication management and Plan as follows    Prior  documentation reviewed   MDD ,recurent moderate: somewhat subdued due to surgery pending overall meds keep some balcne, continue cymbalta 60 plus 3omg C/o back pain and knee pain, pending surgery now  GAD/ PTSD;fair on cymbalta and buspar, will continue  Chronic pain: see above following with providers, pending knee surger  Insomnia: irregular at times on valium for sleep, continue sleep hygyiene Fu 35m  NMerian Capron MD 10/23/202310:43 AM

## 2022-03-09 DIAGNOSIS — I10 Essential (primary) hypertension: Secondary | ICD-10-CM | POA: Diagnosis not present

## 2022-03-09 DIAGNOSIS — E785 Hyperlipidemia, unspecified: Secondary | ICD-10-CM | POA: Diagnosis not present

## 2022-03-09 DIAGNOSIS — E876 Hypokalemia: Secondary | ICD-10-CM | POA: Diagnosis not present

## 2022-03-09 DIAGNOSIS — E519 Thiamine deficiency, unspecified: Secondary | ICD-10-CM | POA: Diagnosis not present

## 2022-03-09 DIAGNOSIS — E559 Vitamin D deficiency, unspecified: Secondary | ICD-10-CM | POA: Diagnosis not present

## 2022-03-09 DIAGNOSIS — E119 Type 2 diabetes mellitus without complications: Secondary | ICD-10-CM | POA: Diagnosis not present

## 2022-03-09 DIAGNOSIS — E538 Deficiency of other specified B group vitamins: Secondary | ICD-10-CM | POA: Diagnosis not present

## 2022-03-09 DIAGNOSIS — K760 Fatty (change of) liver, not elsewhere classified: Secondary | ICD-10-CM | POA: Diagnosis not present

## 2022-03-09 DIAGNOSIS — Z720 Tobacco use: Secondary | ICD-10-CM | POA: Diagnosis not present

## 2022-03-15 DIAGNOSIS — I10 Essential (primary) hypertension: Secondary | ICD-10-CM | POA: Diagnosis not present

## 2022-03-15 DIAGNOSIS — M1611 Unilateral primary osteoarthritis, right hip: Secondary | ICD-10-CM | POA: Diagnosis not present

## 2022-03-21 DIAGNOSIS — I10 Essential (primary) hypertension: Secondary | ICD-10-CM | POA: Diagnosis not present

## 2022-03-21 DIAGNOSIS — R0600 Dyspnea, unspecified: Secondary | ICD-10-CM | POA: Diagnosis not present

## 2022-03-29 DIAGNOSIS — E785 Hyperlipidemia, unspecified: Secondary | ICD-10-CM | POA: Diagnosis not present

## 2022-03-29 DIAGNOSIS — G4733 Obstructive sleep apnea (adult) (pediatric): Secondary | ICD-10-CM | POA: Diagnosis not present

## 2022-03-29 DIAGNOSIS — I10 Essential (primary) hypertension: Secondary | ICD-10-CM | POA: Diagnosis not present

## 2022-03-29 DIAGNOSIS — Z01818 Encounter for other preprocedural examination: Secondary | ICD-10-CM | POA: Diagnosis not present

## 2022-03-29 DIAGNOSIS — D649 Anemia, unspecified: Secondary | ICD-10-CM | POA: Diagnosis not present

## 2022-03-29 DIAGNOSIS — G459 Transient cerebral ischemic attack, unspecified: Secondary | ICD-10-CM | POA: Diagnosis not present

## 2022-03-29 DIAGNOSIS — F17210 Nicotine dependence, cigarettes, uncomplicated: Secondary | ICD-10-CM | POA: Diagnosis not present

## 2022-03-29 DIAGNOSIS — D509 Iron deficiency anemia, unspecified: Secondary | ICD-10-CM | POA: Diagnosis not present

## 2022-03-29 DIAGNOSIS — M1611 Unilateral primary osteoarthritis, right hip: Secondary | ICD-10-CM | POA: Diagnosis not present

## 2022-03-29 DIAGNOSIS — K5732 Diverticulitis of large intestine without perforation or abscess without bleeding: Secondary | ICD-10-CM | POA: Diagnosis not present

## 2022-03-29 DIAGNOSIS — G35 Multiple sclerosis: Secondary | ICD-10-CM | POA: Diagnosis not present

## 2022-03-29 DIAGNOSIS — E119 Type 2 diabetes mellitus without complications: Secondary | ICD-10-CM | POA: Diagnosis not present

## 2022-04-05 DIAGNOSIS — Z01818 Encounter for other preprocedural examination: Secondary | ICD-10-CM | POA: Diagnosis not present

## 2022-04-08 DIAGNOSIS — Z79899 Other long term (current) drug therapy: Secondary | ICD-10-CM | POA: Diagnosis not present

## 2022-04-08 DIAGNOSIS — M545 Low back pain, unspecified: Secondary | ICD-10-CM | POA: Diagnosis not present

## 2022-04-08 DIAGNOSIS — G8929 Other chronic pain: Secondary | ICD-10-CM | POA: Diagnosis not present

## 2022-04-08 DIAGNOSIS — Z79891 Long term (current) use of opiate analgesic: Secondary | ICD-10-CM | POA: Diagnosis not present

## 2022-04-10 DIAGNOSIS — E119 Type 2 diabetes mellitus without complications: Secondary | ICD-10-CM | POA: Diagnosis not present

## 2022-04-10 DIAGNOSIS — E785 Hyperlipidemia, unspecified: Secondary | ICD-10-CM | POA: Diagnosis not present

## 2022-04-10 DIAGNOSIS — Z96641 Presence of right artificial hip joint: Secondary | ICD-10-CM | POA: Diagnosis not present

## 2022-04-10 DIAGNOSIS — Z8673 Personal history of transient ischemic attack (TIA), and cerebral infarction without residual deficits: Secondary | ICD-10-CM | POA: Diagnosis not present

## 2022-04-10 DIAGNOSIS — Z85828 Personal history of other malignant neoplasm of skin: Secondary | ICD-10-CM | POA: Diagnosis not present

## 2022-04-10 DIAGNOSIS — F17210 Nicotine dependence, cigarettes, uncomplicated: Secondary | ICD-10-CM | POA: Diagnosis not present

## 2022-04-10 DIAGNOSIS — G35 Multiple sclerosis: Secondary | ICD-10-CM | POA: Diagnosis not present

## 2022-04-10 DIAGNOSIS — I10 Essential (primary) hypertension: Secondary | ICD-10-CM | POA: Diagnosis not present

## 2022-04-10 DIAGNOSIS — D6869 Other thrombophilia: Secondary | ICD-10-CM | POA: Diagnosis not present

## 2022-04-10 DIAGNOSIS — Z885 Allergy status to narcotic agent status: Secondary | ICD-10-CM | POA: Diagnosis not present

## 2022-04-10 DIAGNOSIS — M25551 Pain in right hip: Secondary | ICD-10-CM | POA: Diagnosis not present

## 2022-04-10 DIAGNOSIS — K769 Liver disease, unspecified: Secondary | ICD-10-CM | POA: Diagnosis not present

## 2022-04-10 DIAGNOSIS — M1611 Unilateral primary osteoarthritis, right hip: Secondary | ICD-10-CM | POA: Diagnosis not present

## 2022-04-10 DIAGNOSIS — G43909 Migraine, unspecified, not intractable, without status migrainosus: Secondary | ICD-10-CM | POA: Diagnosis not present

## 2022-04-10 DIAGNOSIS — K219 Gastro-esophageal reflux disease without esophagitis: Secondary | ICD-10-CM | POA: Diagnosis not present

## 2022-04-10 DIAGNOSIS — G4733 Obstructive sleep apnea (adult) (pediatric): Secondary | ICD-10-CM | POA: Diagnosis not present

## 2022-04-10 DIAGNOSIS — E1169 Type 2 diabetes mellitus with other specified complication: Secondary | ICD-10-CM | POA: Diagnosis not present

## 2022-04-10 DIAGNOSIS — N200 Calculus of kidney: Secondary | ICD-10-CM | POA: Diagnosis not present

## 2022-04-10 DIAGNOSIS — D3502 Benign neoplasm of left adrenal gland: Secondary | ICD-10-CM | POA: Diagnosis not present

## 2022-04-10 DIAGNOSIS — D735 Infarction of spleen: Secondary | ICD-10-CM | POA: Diagnosis not present

## 2022-04-10 DIAGNOSIS — Z86718 Personal history of other venous thrombosis and embolism: Secondary | ICD-10-CM | POA: Diagnosis not present

## 2022-04-10 DIAGNOSIS — M797 Fibromyalgia: Secondary | ICD-10-CM | POA: Diagnosis not present

## 2022-04-11 DIAGNOSIS — Z885 Allergy status to narcotic agent status: Secondary | ICD-10-CM | POA: Diagnosis not present

## 2022-04-11 DIAGNOSIS — G43909 Migraine, unspecified, not intractable, without status migrainosus: Secondary | ICD-10-CM | POA: Diagnosis not present

## 2022-04-11 DIAGNOSIS — N200 Calculus of kidney: Secondary | ICD-10-CM | POA: Diagnosis not present

## 2022-04-11 DIAGNOSIS — G35 Multiple sclerosis: Secondary | ICD-10-CM | POA: Diagnosis not present

## 2022-04-11 DIAGNOSIS — Z8673 Personal history of transient ischemic attack (TIA), and cerebral infarction without residual deficits: Secondary | ICD-10-CM | POA: Diagnosis not present

## 2022-04-11 DIAGNOSIS — E119 Type 2 diabetes mellitus without complications: Secondary | ICD-10-CM | POA: Diagnosis not present

## 2022-04-11 DIAGNOSIS — D735 Infarction of spleen: Secondary | ICD-10-CM | POA: Diagnosis not present

## 2022-04-11 DIAGNOSIS — M797 Fibromyalgia: Secondary | ICD-10-CM | POA: Diagnosis not present

## 2022-04-11 DIAGNOSIS — K219 Gastro-esophageal reflux disease without esophagitis: Secondary | ICD-10-CM | POA: Diagnosis not present

## 2022-04-11 DIAGNOSIS — K769 Liver disease, unspecified: Secondary | ICD-10-CM | POA: Diagnosis not present

## 2022-04-11 DIAGNOSIS — M1611 Unilateral primary osteoarthritis, right hip: Secondary | ICD-10-CM | POA: Diagnosis not present

## 2022-04-11 DIAGNOSIS — F17210 Nicotine dependence, cigarettes, uncomplicated: Secondary | ICD-10-CM | POA: Diagnosis not present

## 2022-04-11 DIAGNOSIS — Z86718 Personal history of other venous thrombosis and embolism: Secondary | ICD-10-CM | POA: Diagnosis not present

## 2022-04-11 DIAGNOSIS — G4733 Obstructive sleep apnea (adult) (pediatric): Secondary | ICD-10-CM | POA: Diagnosis not present

## 2022-04-11 DIAGNOSIS — D3502 Benign neoplasm of left adrenal gland: Secondary | ICD-10-CM | POA: Diagnosis not present

## 2022-04-11 DIAGNOSIS — Z85828 Personal history of other malignant neoplasm of skin: Secondary | ICD-10-CM | POA: Diagnosis not present

## 2022-04-11 DIAGNOSIS — I10 Essential (primary) hypertension: Secondary | ICD-10-CM | POA: Diagnosis not present

## 2022-04-11 DIAGNOSIS — D6869 Other thrombophilia: Secondary | ICD-10-CM | POA: Diagnosis not present

## 2022-04-12 DIAGNOSIS — K219 Gastro-esophageal reflux disease without esophagitis: Secondary | ICD-10-CM | POA: Diagnosis not present

## 2022-04-12 DIAGNOSIS — Z86718 Personal history of other venous thrombosis and embolism: Secondary | ICD-10-CM | POA: Diagnosis not present

## 2022-04-12 DIAGNOSIS — Z7901 Long term (current) use of anticoagulants: Secondary | ICD-10-CM | POA: Diagnosis not present

## 2022-04-12 DIAGNOSIS — Z79891 Long term (current) use of opiate analgesic: Secondary | ICD-10-CM | POA: Diagnosis not present

## 2022-04-12 DIAGNOSIS — R569 Unspecified convulsions: Secondary | ICD-10-CM | POA: Diagnosis not present

## 2022-04-12 DIAGNOSIS — Z471 Aftercare following joint replacement surgery: Secondary | ICD-10-CM | POA: Diagnosis not present

## 2022-04-12 DIAGNOSIS — M069 Rheumatoid arthritis, unspecified: Secondary | ICD-10-CM | POA: Diagnosis not present

## 2022-04-12 DIAGNOSIS — G43909 Migraine, unspecified, not intractable, without status migrainosus: Secondary | ICD-10-CM | POA: Diagnosis not present

## 2022-04-12 DIAGNOSIS — Z96652 Presence of left artificial knee joint: Secondary | ICD-10-CM | POA: Diagnosis not present

## 2022-04-12 DIAGNOSIS — M5136 Other intervertebral disc degeneration, lumbar region: Secondary | ICD-10-CM | POA: Diagnosis not present

## 2022-04-12 DIAGNOSIS — F17210 Nicotine dependence, cigarettes, uncomplicated: Secondary | ICD-10-CM | POA: Diagnosis not present

## 2022-04-12 DIAGNOSIS — I7300 Raynaud's syndrome without gangrene: Secondary | ICD-10-CM | POA: Diagnosis not present

## 2022-04-12 DIAGNOSIS — G35 Multiple sclerosis: Secondary | ICD-10-CM | POA: Diagnosis not present

## 2022-04-12 DIAGNOSIS — Z8673 Personal history of transient ischemic attack (TIA), and cerebral infarction without residual deficits: Secondary | ICD-10-CM | POA: Diagnosis not present

## 2022-04-12 DIAGNOSIS — Z96641 Presence of right artificial hip joint: Secondary | ICD-10-CM | POA: Diagnosis not present

## 2022-04-12 DIAGNOSIS — K589 Irritable bowel syndrome without diarrhea: Secondary | ICD-10-CM | POA: Diagnosis not present

## 2022-04-12 DIAGNOSIS — E119 Type 2 diabetes mellitus without complications: Secondary | ICD-10-CM | POA: Diagnosis not present

## 2022-04-12 DIAGNOSIS — I10 Essential (primary) hypertension: Secondary | ICD-10-CM | POA: Diagnosis not present

## 2022-04-12 DIAGNOSIS — F32A Depression, unspecified: Secondary | ICD-10-CM | POA: Diagnosis not present

## 2022-04-12 DIAGNOSIS — Z7951 Long term (current) use of inhaled steroids: Secondary | ICD-10-CM | POA: Diagnosis not present

## 2022-04-12 DIAGNOSIS — K5790 Diverticulosis of intestine, part unspecified, without perforation or abscess without bleeding: Secondary | ICD-10-CM | POA: Diagnosis not present

## 2022-04-14 ENCOUNTER — Telehealth: Payer: Self-pay

## 2022-04-14 NOTE — Patient Outreach (Signed)
  Care Coordination   Initial Visit Note   04/14/2022 Name: Ashley Gomez MRN: [id number] DOB: 07-02-1970  Ashley Gomez is a 51 y.o. year old female who sees Metheney, Rene Kocher, MD for primary care. I spoke with  Ashley Gomez by phone today.  What matters to the patients health and wellness today?  Patient reports recent right hip replacement. She reports she has some pain, but reports she is using ice and following provider recommendations as instructed. She states she already has a care coordinator at this time and declines participation in the program.    Goals Addressed             This Visit's Progress    COMPLETED: Care Coordination Activities       Care Coordination Interventions: Discussed care coordination program RNCM offered to follow up with patient since she has had recent Right hip replacement. Patient declines. RNCM encouraged patient to follow recommended instructions post procedure and encouraged to contact surgeon with any questions or concerns regarding hip replacement. Encouraged patient to contact RNCM (contact number provided) and/or primary care provider if care coordination needs change.       SDOH assessments and interventions completed:  Yes  SDOH Interventions Today    Flowsheet Row Most Recent Value  SDOH Interventions   Food Insecurity Interventions Intervention Not Indicated  Transportation Interventions Intervention Not Indicated     Care Coordination Interventions:  Yes, provided   Follow up plan: No further intervention required.   Encounter Outcome:  Pt. Visit Completed   Thea Silversmith, RN, MSN, BSN, [place] Coordinator [phone]

## 2022-04-19 DIAGNOSIS — Z8673 Personal history of transient ischemic attack (TIA), and cerebral infarction without residual deficits: Secondary | ICD-10-CM | POA: Diagnosis not present

## 2022-04-19 DIAGNOSIS — Z7901 Long term (current) use of anticoagulants: Secondary | ICD-10-CM | POA: Diagnosis not present

## 2022-04-19 DIAGNOSIS — Z79891 Long term (current) use of opiate analgesic: Secondary | ICD-10-CM | POA: Diagnosis not present

## 2022-04-19 DIAGNOSIS — G35 Multiple sclerosis: Secondary | ICD-10-CM | POA: Diagnosis not present

## 2022-04-19 DIAGNOSIS — Z96641 Presence of right artificial hip joint: Secondary | ICD-10-CM | POA: Diagnosis not present

## 2022-04-19 DIAGNOSIS — E119 Type 2 diabetes mellitus without complications: Secondary | ICD-10-CM | POA: Diagnosis not present

## 2022-04-19 DIAGNOSIS — F32A Depression, unspecified: Secondary | ICD-10-CM | POA: Diagnosis not present

## 2022-04-19 DIAGNOSIS — I10 Essential (primary) hypertension: Secondary | ICD-10-CM | POA: Diagnosis not present

## 2022-04-19 DIAGNOSIS — Z7951 Long term (current) use of inhaled steroids: Secondary | ICD-10-CM | POA: Diagnosis not present

## 2022-04-19 DIAGNOSIS — K5790 Diverticulosis of intestine, part unspecified, without perforation or abscess without bleeding: Secondary | ICD-10-CM | POA: Diagnosis not present

## 2022-04-19 DIAGNOSIS — Z471 Aftercare following joint replacement surgery: Secondary | ICD-10-CM | POA: Diagnosis not present

## 2022-04-19 DIAGNOSIS — M5136 Other intervertebral disc degeneration, lumbar region: Secondary | ICD-10-CM | POA: Diagnosis not present

## 2022-04-19 DIAGNOSIS — I7300 Raynaud's syndrome without gangrene: Secondary | ICD-10-CM | POA: Diagnosis not present

## 2022-04-19 DIAGNOSIS — R569 Unspecified convulsions: Secondary | ICD-10-CM | POA: Diagnosis not present

## 2022-04-19 DIAGNOSIS — F17210 Nicotine dependence, cigarettes, uncomplicated: Secondary | ICD-10-CM | POA: Diagnosis not present

## 2022-04-19 DIAGNOSIS — G43909 Migraine, unspecified, not intractable, without status migrainosus: Secondary | ICD-10-CM | POA: Diagnosis not present

## 2022-04-19 DIAGNOSIS — K219 Gastro-esophageal reflux disease without esophagitis: Secondary | ICD-10-CM | POA: Diagnosis not present

## 2022-04-19 DIAGNOSIS — Z96652 Presence of left artificial knee joint: Secondary | ICD-10-CM | POA: Diagnosis not present

## 2022-04-19 DIAGNOSIS — M069 Rheumatoid arthritis, unspecified: Secondary | ICD-10-CM | POA: Diagnosis not present

## 2022-04-19 DIAGNOSIS — K589 Irritable bowel syndrome without diarrhea: Secondary | ICD-10-CM | POA: Diagnosis not present

## 2022-04-19 DIAGNOSIS — Z86718 Personal history of other venous thrombosis and embolism: Secondary | ICD-10-CM | POA: Diagnosis not present

## 2022-04-21 DIAGNOSIS — Z86718 Personal history of other venous thrombosis and embolism: Secondary | ICD-10-CM | POA: Diagnosis not present

## 2022-04-21 DIAGNOSIS — I7300 Raynaud's syndrome without gangrene: Secondary | ICD-10-CM | POA: Diagnosis not present

## 2022-04-21 DIAGNOSIS — R569 Unspecified convulsions: Secondary | ICD-10-CM | POA: Diagnosis not present

## 2022-04-21 DIAGNOSIS — Z79891 Long term (current) use of opiate analgesic: Secondary | ICD-10-CM | POA: Diagnosis not present

## 2022-04-21 DIAGNOSIS — Z96652 Presence of left artificial knee joint: Secondary | ICD-10-CM | POA: Diagnosis not present

## 2022-04-21 DIAGNOSIS — K589 Irritable bowel syndrome without diarrhea: Secondary | ICD-10-CM | POA: Diagnosis not present

## 2022-04-21 DIAGNOSIS — M5136 Other intervertebral disc degeneration, lumbar region: Secondary | ICD-10-CM | POA: Diagnosis not present

## 2022-04-21 DIAGNOSIS — Z7901 Long term (current) use of anticoagulants: Secondary | ICD-10-CM | POA: Diagnosis not present

## 2022-04-21 DIAGNOSIS — M069 Rheumatoid arthritis, unspecified: Secondary | ICD-10-CM | POA: Diagnosis not present

## 2022-04-21 DIAGNOSIS — G35 Multiple sclerosis: Secondary | ICD-10-CM | POA: Diagnosis not present

## 2022-04-21 DIAGNOSIS — K219 Gastro-esophageal reflux disease without esophagitis: Secondary | ICD-10-CM | POA: Diagnosis not present

## 2022-04-21 DIAGNOSIS — Z8673 Personal history of transient ischemic attack (TIA), and cerebral infarction without residual deficits: Secondary | ICD-10-CM | POA: Diagnosis not present

## 2022-04-21 DIAGNOSIS — I10 Essential (primary) hypertension: Secondary | ICD-10-CM | POA: Diagnosis not present

## 2022-04-21 DIAGNOSIS — E119 Type 2 diabetes mellitus without complications: Secondary | ICD-10-CM | POA: Diagnosis not present

## 2022-04-21 DIAGNOSIS — Z471 Aftercare following joint replacement surgery: Secondary | ICD-10-CM | POA: Diagnosis not present

## 2022-04-21 DIAGNOSIS — F32A Depression, unspecified: Secondary | ICD-10-CM | POA: Diagnosis not present

## 2022-04-21 DIAGNOSIS — Z7951 Long term (current) use of inhaled steroids: Secondary | ICD-10-CM | POA: Diagnosis not present

## 2022-04-21 DIAGNOSIS — Z96641 Presence of right artificial hip joint: Secondary | ICD-10-CM | POA: Diagnosis not present

## 2022-04-21 DIAGNOSIS — K5790 Diverticulosis of intestine, part unspecified, without perforation or abscess without bleeding: Secondary | ICD-10-CM | POA: Diagnosis not present

## 2022-04-21 DIAGNOSIS — G43909 Migraine, unspecified, not intractable, without status migrainosus: Secondary | ICD-10-CM | POA: Diagnosis not present

## 2022-04-21 DIAGNOSIS — F17210 Nicotine dependence, cigarettes, uncomplicated: Secondary | ICD-10-CM | POA: Diagnosis not present

## 2022-04-26 DIAGNOSIS — Z96652 Presence of left artificial knee joint: Secondary | ICD-10-CM | POA: Diagnosis not present

## 2022-04-26 DIAGNOSIS — M069 Rheumatoid arthritis, unspecified: Secondary | ICD-10-CM | POA: Diagnosis not present

## 2022-04-26 DIAGNOSIS — E119 Type 2 diabetes mellitus without complications: Secondary | ICD-10-CM | POA: Diagnosis not present

## 2022-04-26 DIAGNOSIS — Z79891 Long term (current) use of opiate analgesic: Secondary | ICD-10-CM | POA: Diagnosis not present

## 2022-04-26 DIAGNOSIS — R569 Unspecified convulsions: Secondary | ICD-10-CM | POA: Diagnosis not present

## 2022-04-26 DIAGNOSIS — F17210 Nicotine dependence, cigarettes, uncomplicated: Secondary | ICD-10-CM | POA: Diagnosis not present

## 2022-04-26 DIAGNOSIS — Z8673 Personal history of transient ischemic attack (TIA), and cerebral infarction without residual deficits: Secondary | ICD-10-CM | POA: Diagnosis not present

## 2022-04-26 DIAGNOSIS — G43909 Migraine, unspecified, not intractable, without status migrainosus: Secondary | ICD-10-CM | POA: Diagnosis not present

## 2022-04-26 DIAGNOSIS — K219 Gastro-esophageal reflux disease without esophagitis: Secondary | ICD-10-CM | POA: Diagnosis not present

## 2022-04-26 DIAGNOSIS — M5136 Other intervertebral disc degeneration, lumbar region: Secondary | ICD-10-CM | POA: Diagnosis not present

## 2022-04-26 DIAGNOSIS — Z86718 Personal history of other venous thrombosis and embolism: Secondary | ICD-10-CM | POA: Diagnosis not present

## 2022-04-26 DIAGNOSIS — Z471 Aftercare following joint replacement surgery: Secondary | ICD-10-CM | POA: Diagnosis not present

## 2022-04-26 DIAGNOSIS — Z96641 Presence of right artificial hip joint: Secondary | ICD-10-CM | POA: Diagnosis not present

## 2022-04-26 DIAGNOSIS — F32A Depression, unspecified: Secondary | ICD-10-CM | POA: Diagnosis not present

## 2022-04-26 DIAGNOSIS — Z7901 Long term (current) use of anticoagulants: Secondary | ICD-10-CM | POA: Diagnosis not present

## 2022-04-26 DIAGNOSIS — K5790 Diverticulosis of intestine, part unspecified, without perforation or abscess without bleeding: Secondary | ICD-10-CM | POA: Diagnosis not present

## 2022-04-26 DIAGNOSIS — I7300 Raynaud's syndrome without gangrene: Secondary | ICD-10-CM | POA: Diagnosis not present

## 2022-04-26 DIAGNOSIS — G35 Multiple sclerosis: Secondary | ICD-10-CM | POA: Diagnosis not present

## 2022-04-26 DIAGNOSIS — I10 Essential (primary) hypertension: Secondary | ICD-10-CM | POA: Diagnosis not present

## 2022-04-26 DIAGNOSIS — K589 Irritable bowel syndrome without diarrhea: Secondary | ICD-10-CM | POA: Diagnosis not present

## 2022-04-26 DIAGNOSIS — Z7951 Long term (current) use of inhaled steroids: Secondary | ICD-10-CM | POA: Diagnosis not present

## 2022-04-27 DIAGNOSIS — Z96641 Presence of right artificial hip joint: Secondary | ICD-10-CM | POA: Diagnosis not present

## 2022-04-28 DIAGNOSIS — G8929 Other chronic pain: Secondary | ICD-10-CM | POA: Diagnosis not present

## 2022-04-28 DIAGNOSIS — M545 Low back pain, unspecified: Secondary | ICD-10-CM | POA: Diagnosis not present

## 2022-04-28 DIAGNOSIS — Z79899 Other long term (current) drug therapy: Secondary | ICD-10-CM | POA: Diagnosis not present

## 2022-04-28 DIAGNOSIS — Z79891 Long term (current) use of opiate analgesic: Secondary | ICD-10-CM | POA: Diagnosis not present

## 2022-05-02 DIAGNOSIS — Z96641 Presence of right artificial hip joint: Secondary | ICD-10-CM | POA: Diagnosis not present

## 2022-05-02 DIAGNOSIS — Z79891 Long term (current) use of opiate analgesic: Secondary | ICD-10-CM | POA: Diagnosis not present

## 2022-05-02 DIAGNOSIS — F17210 Nicotine dependence, cigarettes, uncomplicated: Secondary | ICD-10-CM | POA: Diagnosis not present

## 2022-05-02 DIAGNOSIS — K219 Gastro-esophageal reflux disease without esophagitis: Secondary | ICD-10-CM | POA: Diagnosis not present

## 2022-05-02 DIAGNOSIS — M069 Rheumatoid arthritis, unspecified: Secondary | ICD-10-CM | POA: Diagnosis not present

## 2022-05-02 DIAGNOSIS — G35 Multiple sclerosis: Secondary | ICD-10-CM | POA: Diagnosis not present

## 2022-05-02 DIAGNOSIS — Z471 Aftercare following joint replacement surgery: Secondary | ICD-10-CM | POA: Diagnosis not present

## 2022-05-02 DIAGNOSIS — I10 Essential (primary) hypertension: Secondary | ICD-10-CM | POA: Diagnosis not present

## 2022-05-02 DIAGNOSIS — Z86718 Personal history of other venous thrombosis and embolism: Secondary | ICD-10-CM | POA: Diagnosis not present

## 2022-05-02 DIAGNOSIS — Z8673 Personal history of transient ischemic attack (TIA), and cerebral infarction without residual deficits: Secondary | ICD-10-CM | POA: Diagnosis not present

## 2022-05-02 DIAGNOSIS — K5790 Diverticulosis of intestine, part unspecified, without perforation or abscess without bleeding: Secondary | ICD-10-CM | POA: Diagnosis not present

## 2022-05-02 DIAGNOSIS — Z96652 Presence of left artificial knee joint: Secondary | ICD-10-CM | POA: Diagnosis not present

## 2022-05-02 DIAGNOSIS — Z7901 Long term (current) use of anticoagulants: Secondary | ICD-10-CM | POA: Diagnosis not present

## 2022-05-02 DIAGNOSIS — F32A Depression, unspecified: Secondary | ICD-10-CM | POA: Diagnosis not present

## 2022-05-02 DIAGNOSIS — I7300 Raynaud's syndrome without gangrene: Secondary | ICD-10-CM | POA: Diagnosis not present

## 2022-05-02 DIAGNOSIS — M5136 Other intervertebral disc degeneration, lumbar region: Secondary | ICD-10-CM | POA: Diagnosis not present

## 2022-05-02 DIAGNOSIS — Z7951 Long term (current) use of inhaled steroids: Secondary | ICD-10-CM | POA: Diagnosis not present

## 2022-05-02 DIAGNOSIS — R569 Unspecified convulsions: Secondary | ICD-10-CM | POA: Diagnosis not present

## 2022-05-02 DIAGNOSIS — E119 Type 2 diabetes mellitus without complications: Secondary | ICD-10-CM | POA: Diagnosis not present

## 2022-05-02 DIAGNOSIS — K589 Irritable bowel syndrome without diarrhea: Secondary | ICD-10-CM | POA: Diagnosis not present

## 2022-05-02 DIAGNOSIS — G43909 Migraine, unspecified, not intractable, without status migrainosus: Secondary | ICD-10-CM | POA: Diagnosis not present

## 2022-05-03 DIAGNOSIS — Z79899 Other long term (current) drug therapy: Secondary | ICD-10-CM | POA: Diagnosis not present

## 2022-05-22 ENCOUNTER — Other Ambulatory Visit: Payer: Self-pay | Admitting: Family Medicine

## 2022-05-22 DIAGNOSIS — I7300 Raynaud's syndrome without gangrene: Secondary | ICD-10-CM

## 2022-05-24 ENCOUNTER — Telehealth (HOSPITAL_COMMUNITY): Payer: Self-pay | Admitting: Psychiatry

## 2022-05-24 DIAGNOSIS — F331 Major depressive disorder, recurrent, moderate: Secondary | ICD-10-CM

## 2022-05-24 DIAGNOSIS — F411 Generalized anxiety disorder: Secondary | ICD-10-CM

## 2022-05-24 MED ORDER — DULOXETINE HCL 30 MG PO CPEP: ORAL_CAPSULE | ORAL | 0 refills | Status: DC

## 2022-05-24 MED ORDER — DULOXETINE HCL 60 MG PO CPEP: 60.0000 mg | ORAL_CAPSULE | Freq: Every day | ORAL | 0 refills | Status: DC

## 2022-05-24 NOTE — Telephone Encounter (Signed)
Spoke to pt- rschd appt.  Needs refill on Cymbalta '30mg'$  and '60mg'$  [facility]   Next Visit -1/29 Last Visit -10/23

## 2022-05-31 ENCOUNTER — Telehealth (HOSPITAL_COMMUNITY): Payer: Medicare Other | Admitting: Psychiatry

## 2022-05-31 DIAGNOSIS — M545 Low back pain, unspecified: Secondary | ICD-10-CM | POA: Diagnosis not present

## 2022-05-31 DIAGNOSIS — G35 Multiple sclerosis: Secondary | ICD-10-CM | POA: Diagnosis not present

## 2022-05-31 DIAGNOSIS — Z79899 Other long term (current) drug therapy: Secondary | ICD-10-CM | POA: Diagnosis not present

## 2022-06-01 ENCOUNTER — Other Ambulatory Visit: Payer: Self-pay | Admitting: Family Medicine

## 2022-06-02 DIAGNOSIS — Z79899 Other long term (current) drug therapy: Secondary | ICD-10-CM | POA: Diagnosis not present

## 2022-06-05 ENCOUNTER — Encounter: Payer: Self-pay | Admitting: Family Medicine

## 2022-06-05 ENCOUNTER — Ambulatory Visit (INDEPENDENT_AMBULATORY_CARE_PROVIDER_SITE_OTHER): Payer: 59 | Admitting: Family Medicine

## 2022-06-05 ENCOUNTER — Ambulatory Visit: Payer: Medicare Other | Admitting: Family Medicine

## 2022-06-05 VITALS — BP 135/61 | HR 81 | Ht 66.0 in | Wt 243.0 lb

## 2022-06-05 DIAGNOSIS — I7300 Raynaud's syndrome without gangrene: Secondary | ICD-10-CM

## 2022-06-05 DIAGNOSIS — G8929 Other chronic pain: Secondary | ICD-10-CM | POA: Diagnosis not present

## 2022-06-05 DIAGNOSIS — R1013 Epigastric pain: Secondary | ICD-10-CM

## 2022-06-05 DIAGNOSIS — R7301 Impaired fasting glucose: Secondary | ICD-10-CM

## 2022-06-05 DIAGNOSIS — F411 Generalized anxiety disorder: Secondary | ICD-10-CM

## 2022-06-05 DIAGNOSIS — I10 Essential (primary) hypertension: Secondary | ICD-10-CM | POA: Diagnosis not present

## 2022-06-05 DIAGNOSIS — Z23 Encounter for immunization: Secondary | ICD-10-CM | POA: Diagnosis not present

## 2022-06-05 MED ORDER — DIAZEPAM 5 MG PO TABS: 5.0000 mg | ORAL_TABLET | Freq: Every day | ORAL | 3 refills | Status: DC | PRN

## 2022-06-05 NOTE — Assessment & Plan Note (Signed)
She was really worried that the [facility] would exacerbate her chronic epigastric pain but says it really has not made a difference she still has the pain but is not worse on the medication.

## 2022-06-05 NOTE — Assessment & Plan Note (Signed)
Pressure is a little borderline today would like to see the systolic less than 671.  Will continue to monitor for now I am hoping that now that she has had her hip replaced she will be able to get some increased activity.

## 2022-06-05 NOTE — Progress Notes (Addendum)
Established Patient Office Visit  Subjective   Patient ID: REY FORS, female    DOB: 1970-12-07  Age: 52 y.o. MRN: [id number]  Chief Complaint  Patient presents with   Hypertension    HPI  Hypertension- Pt denies chest pain, SOB, dizziness, or heart palpitations.  Taking meds as directed w/o problems.  Denies medication side effects.  Etiology did end up cutting her chlorthalidone in half because of her potassium levels she is already taking 4 potassium tabs daily.  Impaired fasting glucose-no increased thirst or urination. No symptoms consistent with hypoglycemia. She is on Mounjaro.  Reports that she is actually doing really well on it she is due for an A1c today.  She has been happy with the results.  S/p right hip replacement in November.  she is doing well.  It took a little time to recover because she actually strained groin muscle but she feels like she is finally on track.  She is not completely released.  He also reports that her Raynaud's has been worse lately especially since the winter months.    ROS    Objective:     BP 135/61   Pulse 81   Ht '5\' 6"'$  (1.676 m)   Wt 243 lb (110.2 kg)   LMP 12/28/2014   SpO2 95%   BMI 39.22 kg/m    Physical Exam Vitals and nursing note reviewed.  Constitutional:      Appearance: She is well-developed.  HENT:     Head: Normocephalic and atraumatic.  Cardiovascular:     Rate and Rhythm: Normal rate and regular rhythm.     Heart sounds: Normal heart sounds.  Pulmonary:     Effort: Pulmonary effort is normal.     Breath sounds: Normal breath sounds.  Skin:    General: Skin is warm and dry.  Neurological:     Mental Status: She is alert and oriented to person, place, and time.  Psychiatric:        Behavior: Behavior normal.      No results found for any visits on 06/05/22.    The ASCVD Risk score (Arnett DK, et al., 2019) failed to calculate for the following reasons:   The valid total cholesterol range is  130 to 320 mg/dL    Assessment & Plan:   Problem List Items Addressed This Visit       Cardiovascular and Mediastinum   Raynaud disease    Discussed increasing the amlodipine to 10 mg to see if that helps with some of the Raynaud's symptoms and just trying to keep her extremities warm is much as possible.  If that is helpful we can always update her prescription.  The additional amlodipine might be helpful for her blood pressure as well.      Relevant Medications   chlorthalidone (HYGROTON) 25 MG tablet   Essential hypertension, benign - Primary    Pressure is a little borderline today would like to see the systolic less than 761.  Will continue to monitor for now I am hoping that now that she has had her hip replaced she will be able to get some increased activity.      Relevant Medications   chlorthalidone (HYGROTON) 25 MG tablet   Other Relevant Orders   CBC   COMPLETE METABOLIC PANEL WITH GFR   Hemoglobin A1c     Endocrine   IFG (impaired fasting glucose)    A1c is 5.8 today.  Continue current regimen she is really  doing great on the [facility].  It is currently being written by different provider.  Lab Results  Component Value Date   HGBA1C 6.0 (H) 08/05/2021        Relevant Orders   CBC   COMPLETE METABOLIC PANEL WITH GFR   Hemoglobin A1c     Other   Anxiety state    Refill the diazepam.  Try to use sparingly.  Also on duloxetine and BuSpar      Relevant Medications   diazepam (VALIUM) 5 MG tablet   Other Visit Diagnoses     Need for immunization against influenza       Relevant Orders   Flu Vaccine QUAD 12moIM (Fluarix, Fluzone & Alfiuria Quad PF) (Completed)   Need for pneumococcal 20-valent conjugate vaccination       Relevant Orders   Pneumococcal conjugate vaccine 20-valent (Prevnar 20) (Completed)      She had elevated RBCs on labs.  We discussed that being a smoker [place] to cause an elevation in red blood cells it is not unusual so as long as  it is elevated but stable then that is reassuring.  Return in about 6 months (around 12/04/2022).    CBeatrice Lecher MD

## 2022-06-05 NOTE — Assessment & Plan Note (Signed)
A1c is 5.8 today.  Continue current regimen she is really doing great on the [facility].  It is currently being written by different provider.  Lab Results  Component Value Date   HGBA1C 6.0 (H) 08/05/2021

## 2022-06-05 NOTE — Patient Instructions (Signed)
You can try increasing amlodipine to 10 mg for the next couple of weeks and see if that helps with your Raynaud's.  If it does I can always send in a new prescription.  If it does not then please let me know.

## 2022-06-05 NOTE — Assessment & Plan Note (Signed)
Discussed increasing the amlodipine to 10 mg to see if that helps with some of the Raynaud's symptoms and just trying to keep her extremities warm is much as possible.  If that is helpful we can always update her prescription.  The additional amlodipine might be helpful for her blood pressure as well.

## 2022-06-05 NOTE — Assessment & Plan Note (Signed)
Refill the diazepam.  Try to use sparingly.  Also on duloxetine and BuSpar

## 2022-06-06 LAB — HEMOGLOBIN A1C
Hgb A1c MFr Bld: 6 % of total Hgb — ABNORMAL HIGH (ref <5.7)
Mean Plasma Glucose: 126 mg/dL
eAG (mmol/L): 7 mmol/L

## 2022-06-06 LAB — COMPLETE METABOLIC PANEL WITH GFR
AG Ratio: 1.3 (calc) (ref 1.0–2.5)
ALT: 6 U/L (ref 6–29)
AST: 10 U/L (ref 10–35)
Albumin: 4.1 g/dL (ref 3.6–5.1)
Alkaline phosphatase (APISO): 112 U/L (ref 37–153)
BUN: 14 mg/dL (ref 7–25)
CO2: 32 mmol/L (ref 20–32)
Calcium: 9.6 mg/dL (ref 8.6–10.4)
Chloride: 98 mmol/L (ref 98–110)
Creat: 0.82 mg/dL (ref 0.50–1.03)
Globulin: 3.1 g/dL (calc) (ref 1.9–3.7)
Glucose, Bld: 98 mg/dL (ref 65–99)
Potassium: 4.1 mmol/L (ref 3.5–5.3)
Sodium: 139 mmol/L (ref 135–146)
Total Bilirubin: 0.4 mg/dL (ref 0.2–1.2)
Total Protein: 7.2 g/dL (ref 6.1–8.1)
eGFR: 85 mL/min/{1.73_m2} (ref >=60)

## 2022-06-06 LAB — CBC
HCT: 46.7 % — ABNORMAL HIGH (ref 35.0–45.0)
Hemoglobin: 15.2 g/dL (ref 11.7–15.5)
MCH: 27 pg (ref 27.0–33.0)
MCHC: 32.5 g/dL (ref 32.0–36.0)
MCV: 82.8 fL (ref 80.0–100.0)
MPV: 10.8 fL (ref 7.5–12.5)
Platelets: 333 10*3/uL (ref 140–400)
RBC: 5.64 10*6/uL — ABNORMAL HIGH (ref 3.80–5.10)
RDW: 13.8 % (ref 11.0–15.0)
WBC: 10 10*3/uL (ref 3.8–10.8)

## 2022-06-06 NOTE — Progress Notes (Signed)
Hi Ashley Gomez, hemoglobin is normal.  1C is stable at 6.0.  Metabolic panel looks great.

## 2022-06-12 ENCOUNTER — Encounter (HOSPITAL_COMMUNITY): Payer: Self-pay

## 2022-06-12 ENCOUNTER — Telehealth (HOSPITAL_COMMUNITY): Payer: Medicare Other | Admitting: Psychiatry

## 2022-06-15 ENCOUNTER — Other Ambulatory Visit: Payer: Self-pay | Admitting: Family Medicine

## 2022-06-19 ENCOUNTER — Ambulatory Visit (INDEPENDENT_AMBULATORY_CARE_PROVIDER_SITE_OTHER): Payer: 59 | Admitting: Licensed Clinical Social Worker

## 2022-06-19 DIAGNOSIS — F4321 Adjustment disorder with depressed mood: Secondary | ICD-10-CM | POA: Diagnosis not present

## 2022-06-19 DIAGNOSIS — G894 Chronic pain syndrome: Secondary | ICD-10-CM

## 2022-06-19 DIAGNOSIS — F331 Major depressive disorder, recurrent, moderate: Secondary | ICD-10-CM

## 2022-06-19 DIAGNOSIS — F4310 Post-traumatic stress disorder, unspecified: Secondary | ICD-10-CM

## 2022-06-19 DIAGNOSIS — F5102 Adjustment insomnia: Secondary | ICD-10-CM

## 2022-06-19 DIAGNOSIS — F411 Generalized anxiety disorder: Secondary | ICD-10-CM | POA: Diagnosis not present
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
## 2022-06-29 DIAGNOSIS — M25551 Pain in right hip: Secondary | ICD-10-CM | POA: Diagnosis not present

## 2022-06-29 DIAGNOSIS — Z79891 Long term (current) use of opiate analgesic: Secondary | ICD-10-CM | POA: Diagnosis not present

## 2022-06-29 DIAGNOSIS — M545 Low back pain, unspecified: Secondary | ICD-10-CM | POA: Diagnosis not present

## 2022-06-29 DIAGNOSIS — Z79899 Other long term (current) drug therapy: Secondary | ICD-10-CM | POA: Diagnosis not present

## 2022-06-29 DIAGNOSIS — G8929 Other chronic pain: Secondary | ICD-10-CM | POA: Diagnosis not present

## 2022-07-10 DIAGNOSIS — M7061 Trochanteric bursitis, right hip: Secondary | ICD-10-CM | POA: Diagnosis not present

## 2022-07-18 ENCOUNTER — Ambulatory Visit (HOSPITAL_COMMUNITY): Payer: 59 | Admitting: Licensed Clinical Social Worker

## 2022-07-18 ENCOUNTER — Encounter: Payer: Self-pay | Admitting: Family Medicine

## 2022-07-28 DIAGNOSIS — M25551 Pain in right hip: Secondary | ICD-10-CM | POA: Diagnosis not present

## 2022-07-28 DIAGNOSIS — M545 Low back pain, unspecified: Secondary | ICD-10-CM | POA: Diagnosis not present

## 2022-07-28 DIAGNOSIS — Z79891 Long term (current) use of opiate analgesic: Secondary | ICD-10-CM | POA: Diagnosis not present

## 2022-07-28 DIAGNOSIS — Z79899 Other long term (current) drug therapy: Secondary | ICD-10-CM | POA: Diagnosis not present

## 2022-08-02 DIAGNOSIS — Z79899 Other long term (current) drug therapy: Secondary | ICD-10-CM | POA: Diagnosis not present

## 2022-08-07 ENCOUNTER — Telehealth (HOSPITAL_COMMUNITY): Payer: 59 | Admitting: Psychiatry

## 2022-08-07 ENCOUNTER — Telehealth (INDEPENDENT_AMBULATORY_CARE_PROVIDER_SITE_OTHER): Payer: 59 | Admitting: Psychiatry

## 2022-08-07 ENCOUNTER — Encounter (HOSPITAL_COMMUNITY): Payer: Self-pay | Admitting: Psychiatry

## 2022-08-07 DIAGNOSIS — E785 Hyperlipidemia, unspecified: Secondary | ICD-10-CM | POA: Diagnosis not present

## 2022-08-07 DIAGNOSIS — E119 Type 2 diabetes mellitus without complications: Secondary | ICD-10-CM | POA: Diagnosis not present

## 2022-08-07 DIAGNOSIS — F411 Generalized anxiety disorder: Secondary | ICD-10-CM

## 2022-08-07 DIAGNOSIS — G35 Multiple sclerosis: Secondary | ICD-10-CM | POA: Diagnosis not present

## 2022-08-07 DIAGNOSIS — I10 Essential (primary) hypertension: Secondary | ICD-10-CM | POA: Diagnosis not present

## 2022-08-07 DIAGNOSIS — F4321 Adjustment disorder with depressed mood: Secondary | ICD-10-CM

## 2022-08-07 DIAGNOSIS — F331 Major depressive disorder, recurrent, moderate: Secondary | ICD-10-CM

## 2022-08-07 DIAGNOSIS — Z79899 Other long term (current) drug therapy: Secondary | ICD-10-CM | POA: Diagnosis not present

## 2022-08-07 DIAGNOSIS — R52 Pain, unspecified: Secondary | ICD-10-CM | POA: Diagnosis not present

## 2022-08-07 MED ORDER — BUSPIRONE HCL 7.5 MG PO TABS: 7.5000 mg | ORAL_TABLET | Freq: Two times a day (BID) | ORAL | 0 refills | Status: DC | PRN

## 2022-08-07 MED ORDER — DULOXETINE HCL 30 MG PO CPEP: ORAL_CAPSULE | ORAL | 0 refills | Status: DC

## 2022-08-07 MED ORDER — DULOXETINE HCL 60 MG PO CPEP: 60.0000 mg | ORAL_CAPSULE | Freq: Every day | ORAL | 0 refills | Status: DC
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
## 2022-08-08 ENCOUNTER — Ambulatory Visit (HOSPITAL_COMMUNITY): Payer: 59 | Admitting: Licensed Clinical Social Worker

## 2022-08-24 DIAGNOSIS — R1031 Right lower quadrant pain: Secondary | ICD-10-CM | POA: Diagnosis not present

## 2022-08-24 DIAGNOSIS — Z79899 Other long term (current) drug therapy: Secondary | ICD-10-CM | POA: Diagnosis not present

## 2022-08-24 DIAGNOSIS — G35 Multiple sclerosis: Secondary | ICD-10-CM | POA: Diagnosis not present

## 2022-08-24 DIAGNOSIS — I10 Essential (primary) hypertension: Secondary | ICD-10-CM | POA: Diagnosis not present

## 2022-08-24 DIAGNOSIS — Z8673 Personal history of transient ischemic attack (TIA), and cerebral infarction without residual deficits: Secondary | ICD-10-CM | POA: Diagnosis not present

## 2022-08-24 DIAGNOSIS — R1032 Left lower quadrant pain: Secondary | ICD-10-CM | POA: Diagnosis not present

## 2022-08-24 DIAGNOSIS — F17210 Nicotine dependence, cigarettes, uncomplicated: Secondary | ICD-10-CM | POA: Diagnosis not present

## 2022-08-24 DIAGNOSIS — M5136 Other intervertebral disc degeneration, lumbar region: Secondary | ICD-10-CM | POA: Diagnosis not present

## 2022-08-24 DIAGNOSIS — Z888 Allergy status to other drugs, medicaments and biological substances status: Secondary | ICD-10-CM | POA: Diagnosis not present

## 2022-08-24 DIAGNOSIS — K219 Gastro-esophageal reflux disease without esophagitis: Secondary | ICD-10-CM | POA: Diagnosis not present

## 2022-08-24 DIAGNOSIS — Z8669 Personal history of other diseases of the nervous system and sense organs: Secondary | ICD-10-CM | POA: Diagnosis not present

## 2022-08-24 DIAGNOSIS — Z86718 Personal history of other venous thrombosis and embolism: Secondary | ICD-10-CM | POA: Diagnosis not present

## 2022-08-24 DIAGNOSIS — M069 Rheumatoid arthritis, unspecified: Secondary | ICD-10-CM | POA: Diagnosis not present

## 2022-08-24 DIAGNOSIS — E119 Type 2 diabetes mellitus without complications: Secondary | ICD-10-CM | POA: Diagnosis not present

## 2022-08-25 DIAGNOSIS — M545 Low back pain, unspecified: Secondary | ICD-10-CM | POA: Diagnosis not present

## 2022-08-25 DIAGNOSIS — Z79899 Other long term (current) drug therapy: Secondary | ICD-10-CM | POA: Diagnosis not present

## 2022-08-25 DIAGNOSIS — M25551 Pain in right hip: Secondary | ICD-10-CM | POA: Diagnosis not present

## 2022-08-25 DIAGNOSIS — G8929 Other chronic pain: Secondary | ICD-10-CM | POA: Diagnosis not present

## 2022-08-25 DIAGNOSIS — R030 Elevated blood-pressure reading, without diagnosis of hypertension: Secondary | ICD-10-CM | POA: Diagnosis not present

## 2022-09-01 DIAGNOSIS — M791 Myalgia, unspecified site: Secondary | ICD-10-CM | POA: Diagnosis not present

## 2022-09-03 ENCOUNTER — Other Ambulatory Visit: Payer: Self-pay | Admitting: Family Medicine

## 2022-09-03 DIAGNOSIS — I700 Atherosclerosis of aorta: Secondary | ICD-10-CM

## 2022-09-13 ENCOUNTER — Encounter: Payer: Self-pay | Admitting: Family Medicine

## 2022-09-13 MED ORDER — DIAZEPAM 5 MG PO TABS: 5.0000 mg | ORAL_TABLET | Freq: Every day | ORAL | 3 refills | Status: AC | PRN

## 2022-09-13 MED ORDER — SUMATRIPTAN SUCCINATE 100 MG PO TABS: 100.0000 mg | ORAL_TABLET | ORAL | 5 refills | Status: DC | PRN

## 2022-09-17 ENCOUNTER — Encounter: Payer: Self-pay | Admitting: Family Medicine

## 2022-09-18 ENCOUNTER — Encounter: Payer: Self-pay | Admitting: Family Medicine

## 2022-09-18 MED ORDER — NARATRIPTAN HCL 2.5 MG PO TABS: 2.5000 mg | ORAL_TABLET | ORAL | 0 refills | Status: AC | PRN

## 2022-09-19 DIAGNOSIS — E119 Type 2 diabetes mellitus without complications: Secondary | ICD-10-CM | POA: Diagnosis not present

## 2022-09-20 DIAGNOSIS — M545 Low back pain, unspecified: Secondary | ICD-10-CM | POA: Diagnosis not present

## 2022-09-20 DIAGNOSIS — G8929 Other chronic pain: Secondary | ICD-10-CM | POA: Diagnosis not present

## 2022-09-20 DIAGNOSIS — G43E09 Chronic migraine with aura, not intractable, without status migrainosus: Secondary | ICD-10-CM | POA: Diagnosis not present

## 2022-09-20 DIAGNOSIS — Z79899 Other long term (current) drug therapy: Secondary | ICD-10-CM | POA: Diagnosis not present

## 2022-09-20 DIAGNOSIS — Z79891 Long term (current) use of opiate analgesic: Secondary | ICD-10-CM | POA: Diagnosis not present

## 2022-10-06 DIAGNOSIS — I739 Peripheral vascular disease, unspecified: Secondary | ICD-10-CM | POA: Diagnosis not present

## 2022-10-06 DIAGNOSIS — M1611 Unilateral primary osteoarthritis, right hip: Secondary | ICD-10-CM | POA: Diagnosis not present

## 2022-10-06 DIAGNOSIS — I781 Nevus, non-neoplastic: Secondary | ICD-10-CM | POA: Diagnosis not present

## 2022-10-06 DIAGNOSIS — I7300 Raynaud's syndrome without gangrene: Secondary | ICD-10-CM | POA: Diagnosis not present

## 2022-10-06 DIAGNOSIS — G629 Polyneuropathy, unspecified: Secondary | ICD-10-CM | POA: Diagnosis not present

## 2022-10-06 DIAGNOSIS — Z96641 Presence of right artificial hip joint: Secondary | ICD-10-CM | POA: Diagnosis not present

## 2022-10-06 DIAGNOSIS — M25551 Pain in right hip: Secondary | ICD-10-CM | POA: Diagnosis not present

## 2022-10-20 DIAGNOSIS — Z79891 Long term (current) use of opiate analgesic: Secondary | ICD-10-CM | POA: Diagnosis not present

## 2022-10-20 DIAGNOSIS — G8929 Other chronic pain: Secondary | ICD-10-CM | POA: Diagnosis not present

## 2022-10-20 DIAGNOSIS — Z79899 Other long term (current) drug therapy: Secondary | ICD-10-CM | POA: Diagnosis not present

## 2022-10-20 DIAGNOSIS — G43909 Migraine, unspecified, not intractable, without status migrainosus: Secondary | ICD-10-CM | POA: Diagnosis not present

## 2022-10-20 DIAGNOSIS — M545 Low back pain, unspecified: Secondary | ICD-10-CM | POA: Diagnosis not present

## 2022-10-22 ENCOUNTER — Encounter: Payer: Self-pay | Admitting: Family Medicine

## 2022-10-22 ENCOUNTER — Other Ambulatory Visit: Payer: Self-pay | Admitting: Family Medicine

## 2022-10-24 ENCOUNTER — Ambulatory Visit (HOSPITAL_COMMUNITY): Payer: 59 | Admitting: Licensed Clinical Social Worker

## 2022-11-07 ENCOUNTER — Ambulatory Visit (HOSPITAL_COMMUNITY): Payer: 59 | Admitting: Licensed Clinical Social Worker

## 2022-11-10 ENCOUNTER — Encounter (HOSPITAL_COMMUNITY): Payer: Self-pay | Admitting: Psychiatry

## 2022-11-10 ENCOUNTER — Telehealth (INDEPENDENT_AMBULATORY_CARE_PROVIDER_SITE_OTHER): Payer: 59 | Admitting: Psychiatry

## 2022-11-10 DIAGNOSIS — F4321 Adjustment disorder with depressed mood: Secondary | ICD-10-CM

## 2022-11-10 DIAGNOSIS — F5102 Adjustment insomnia: Secondary | ICD-10-CM

## 2022-11-10 DIAGNOSIS — F331 Major depressive disorder, recurrent, moderate: Secondary | ICD-10-CM | POA: Diagnosis not present

## 2022-11-10 DIAGNOSIS — F411 Generalized anxiety disorder: Secondary | ICD-10-CM | POA: Diagnosis not present

## 2022-11-10 MED ORDER — DULOXETINE HCL 60 MG PO CPEP: 60.0000 mg | ORAL_CAPSULE | Freq: Every day | ORAL | 0 refills | Status: DC

## 2022-11-10 MED ORDER — DULOXETINE HCL 30 MG PO CPEP: ORAL_CAPSULE | ORAL | 0 refills | Status: DC

## 2022-11-10 NOTE — Progress Notes (Signed)
Follow up tele psych  visit   Patient Identification: Ashley Gomez MRN:  [id number] Date of Evaluation:  11/10/2022 Referral Source: Mary . Counsellor      Chief Complaint:   depression follow up   Visit Diagnosis:    ICD-10-CM   1. Depression, major, recurrent, moderate (HCC)  F33.1 DULoxetine (CYMBALTA) 30 MG capsule    2. Generalized anxiety disorder  F41.1 DULoxetine (CYMBALTA) 30 MG capsule    3. Complicated grief  F43.21     4. Adjustment insomnia  F51.02     Virtual Visit via Video Note  I connected with Ashley Gomez on 11/10/22 at 12:00 PM EDT by a video enabled telemedicine application and verified that I am speaking with the correct person using two identifiers.  Location: Patient: home Provider: home office   I discussed the limitations of evaluation and management by telemedicine and the availability of in person appointments. The patient expressed understanding and agreed to proceed.     I discussed the assessment and treatment plan with the patient. The patient was provided an opportunity to ask questions and all were answered. The patient agreed with the plan and demonstrated an understanding of the instructions.   The patient was advised to call back or seek an in-person evaluation if the symptoms worsen or if the condition fails to improve as anticipated.  I provided 20 minutes of non-face-to-face time during this encounter.      History of Present Illness: 52  years old currently single Caucasian female initially referred by her counselor for management of depression anxiety possible PTSD  Has a herniated disc Has lost a family member ,  Physical conditions effect her life and sleep Overall cymbalta helps some balance, understand not to take extra valium and risk discussed   On pain meds for management   Modifying factors; grand kids Aggravating factor:daughters past death  Severity : pain effects mood   Past  Psychiatric History: depression, anxiety  Previous Psychotropic Medications: Yes   Substance Abuse History in the last 12 months:  No.  Consequences of Substance Abuse: NA  Past Medical History:  Past Medical History:  Diagnosis Date   Anxiety    Arthritis    CVA (cerebral infarction)    Old CVA on MRI brain   Depression    DVT (deep venous thrombosis) (HCC) 12/13/2009   L basilic vein    Facet hypertrophy of lumbar region    MRI 2007   Fibromyalgia    GERD (gastroesophageal reflux disease)    Hypertension    Migraines    MS (multiple sclerosis) (HCC)    Neuromuscular disorder (HCC)    Obesity    Pre-diabetes    PTSD (post-traumatic stress disorder)    Seizures (HCC) 2011   no seizure since onset   Smoking    Stroke [facility])    TIA, >8years ago    Past Surgical History:  Procedure Laterality Date   CHOLECYSTECTOMY  10/2017   CHONDROPLASTY Left 12/30/2014   Procedure: CHONDROPLASTY;  Surgeon: Tarry Kos, MD;  Location: [facility];  Service: Orthopedics;  Laterality: Left;   KNEE ARTHROSCOPY Left 12/30/2014   Procedure: LEFT KNEE ARTHROSCOPY WITH   CHONDROPLASTY;  Surgeon: Tarry Kos, MD;  Location:  SURGERY CENTER;  Service: Orthopedics;  Laterality: Left;   PARTIAL KNEE ARTHROPLASTY Left 08/03/2016   Procedure: LEFT UNICOMPARTMENTAL KNEE ARTHROPLASTY;  Surgeon: Tarry Kos, MD;  Location: [facility] OR;  Service: Orthopedics;  Laterality: Left;   SHOULDER ARTHROSCOPY WITH BICEPSTENOTOMY Left 01/15/2020   Procedure: SHOULDER ARTHROSCOPY WITH BICEPSTENOTOMY;  Surgeon: Bjorn Pippin, MD;  Location: [facility];  Service: Orthopedics;  Laterality: Left;   SHOULDER ARTHROSCOPY WITH DISTAL CLAVICLE RESECTION Left 01/15/2020   Procedure: SHOULDER ARTHROSCOPY WITH DISTAL CLAVICULECTOMY;  Surgeon: Bjorn Pippin, MD;  Location: [facility];  Service: Orthopedics;  Laterality: Left;   SHOULDER ARTHROSCOPY WITH ROTATOR CUFF REPAIR AND  SUBACROMIAL DECOMPRESSION Left 01/15/2020   Procedure: SHOULDER ARTHROSCOPY DEBRIDMENT WITH ROTATOR CUFF REPAIR, SUBACROMIAL DECOMPRESSION WITH ACROMIPLASTY, AND BICEP TENOTOMY;  Surgeon: Bjorn Pippin, MD;  Location: [facility];  Service: Orthopedics;  Laterality: Left;   TONSILLECTOMY     TUBAL LIGATION  1992    Family Psychiatric History: Parents: alcohol use disorder  Family History:  Family History  Problem Relation Age of Onset   Cancer Mother 39       melanoma   Hypertension Sister    Heart disease Sister        valve disease   Depression Sister    Diabetes Sister    Sjogren's syndrome Sister     Social History:   Social History   Socioeconomic History   Marital status: Divorced    Spouse name: Not on file   Number of children: 3   Years of education: 12   Highest education level: GED or equivalent  Occupational History   Occupation: Legally disabled  Tobacco Use   Smoking status: Every Day    Packs/day: 1.00    Years: 25.00    Additional pack years: 0.00    Total pack years: 25.00    Types: Cigarettes   Smokeless tobacco: Never   Tobacco comments:    Patient currently smoking 2-3 cigarettes a day   Vaping Use   Vaping Use: Never used  Substance and Sexual Activity   Alcohol use: No    Alcohol/week: 0.0 standard drinks of alcohol   Drug use: No   Sexual activity: Yes    Birth control/protection: None, Post-menopausal  Other Topics Concern   Not on file  Social History Narrative   Her son lives with her after a car accident that he was involved in. She had three children, one has deceased. She enjoys reading, watching television and spending time with her children and grandchildren.   Social Determinants of Health   Financial Resource Strain: Low Risk  (04/29/2021)   Overall Financial Resource Strain (CARDIA)    Difficulty of Paying Living Expenses: Not hard at all  Food Insecurity: No Food Insecurity (04/14/2022)   Hunger Vital Sign     Worried About Running Out of Food in the Last Year: Never true    Ran Out of Food in the Last Year: Never true  Transportation Needs: No Transportation Needs (04/14/2022)   PRAPARE - Administrator, Civil Service (Medical): No    Lack of Transportation (Non-Medical): No  Physical Activity: Inactive (04/29/2021)   Exercise Vital Sign    Days of Exercise per Week: 0 days    Minutes of Exercise per Session: 0 min  Stress: Stress Concern Present (04/29/2021)   [employer] of Occupational Health - Occupational Stress Questionnaire    Feeling of Stress : Rather much  Social Connections: Socially Isolated (04/29/2021)   Social Connection and Isolation Panel [NHANES]    Frequency of Communication with Friends and Family: More than three times  a week    Frequency of Social Gatherings with Friends and Family: Once a week    Attends Religious Services: Never    Database administrator or Organizations: No    Attends Banker Meetings: Never    Marital Status: Divorced      Allergies:   Allergies  Allergen Reactions   Celebrex [Celecoxib] Other (See Comments)    Swelling in extremities   Phenytoin Swelling   Topamax [Topiramate] Other (See Comments)    She reports that this makes her forgetful and causes her to studder   Nucynta [Tapentadol] Other (See Comments)    Headaches, abdominal pain    Ace Inhibitors Cough   Baclofen Nausea Only   Celexa [Citalopram Hydrobromide] Other (See Comments)    Dizziness Tolerated Lexapro    Codeine Nausea Only   Methocarbamol Nausea Only   Sumatriptan Nausea Only   Tizanidine Other (See Comments)    Keeps her awake.    Tramadol Anxiety and Palpitations   Triamcinolone Other (See Comments)    Steroid flare after joint injection, use other steroid for injections    Metabolic Disorder Labs: Lab Results  Component Value Date   HGBA1C 6.0 (H) 06/05/2022   MPG 126 06/05/2022   MPG 126 08/05/2021   Lab Results   Component Value Date   PROLACTIN 4.3 03/04/2013   Lab Results  Component Value Date   CHOL 207 (H) 08/09/2020   TRIG 422 (H) 08/09/2020   HDL 33 (L) 08/09/2020   CHOLHDL 6.3 (H) 08/09/2020   VLDL 30 11/29/2016   LDLCALC  08/09/2020     Comment:     . LDL cholesterol not calculated. Triglyceride levels greater than 400 mg/dL invalidate calculated LDL results. . Reference range: <100 . Desirable range <100 mg/dL for primary prevention;   <70 mg/dL for patients with CHD or diabetic patients  with > or = 2 CHD risk factors. Marland Kitchen LDL-C is now calculated using the Martin-Hopkins  calculation, which is a validated novel method providing  better accuracy than the Friedewald equation in the  estimation of LDL-C.  Horald Pollen et al. Lenox Ahr. 1610;960(45): 2061-2068  (http://education.[facility].com/faq/FAQ164)    LDLCALC 98 08/04/2019     Current Medications: Current Outpatient Medications  Medication Sig Dispense Refill   acetaminophen (TYLENOL) 500 MG tablet Take by mouth.     amLODipine (NORVASC) 5 MG tablet TAKE 1 TABLET (5 MG TOTAL) BY MOUTH DAILY. 90 tablet 1   atorvastatin (LIPITOR) 20 MG tablet TAKE 1 TABLET BY MOUTH EVERYDAY AT BEDTIME 90 tablet 3   busPIRone (BUSPAR) 7.5 MG tablet Take 1 tablet (7.5 mg total) by mouth 2 (two) times daily as needed. 180 tablet 0   chlorthalidone (HYGROTON) 25 MG tablet Take 1 tablet by mouth daily.     clindamycin (CLEOCIN T) 1 % external solution Apply topically 2 (two) times daily.     diazepam (VALIUM) 5 MG tablet Take 1 tablet (5 mg total) by mouth daily as needed for anxiety. 30 tablet 3   diclofenac Sodium (VOLTAREN) 1 % GEL Apply 4 g topically 4 (four) times daily. To affected joint. 100 g 11   DULoxetine (CYMBALTA) 30 MG capsule YAKE WITH 60MG  . TOTAL DAILY DOSE IS  90 MG 90 capsule 0   DULoxetine (CYMBALTA) 60 MG capsule Take 1 capsule (60 mg total) by mouth daily. 90 capsule 0   famotidine (PEPCID) 20 MG tablet TAKE 1 TABLET BY  MOUTH EVERY DAY 90 tablet 3  HYDROmorphone (DILAUDID) 8 MG tablet Take 1 tablet by mouth every 8 (eight) hours as needed.     levocetirizine (XYZAL) 5 MG tablet Take 5 mg by mouth daily.     metoprolol succinate (TOPROL-XL) 25 MG 24 hr tablet TAKE 1 TABLET BY MOUTH EVERY DAY 90 tablet 1   mupirocin ointment (BACTROBAN) 2 % SMARTSIG:1 Application Topical 2-3 Times Daily     naratriptan (AMERGE) 2.5 MG tablet Take 1 tablet (2.5 mg total) by mouth as needed for migraine. Take one (1) tablet at onset of headache; if returns or does not resolve, may repeat after 4 hours; do not exceed five (5) mg in 24 hours. 10 tablet 0   NARCAN 4 MG/0.1ML LIQD nasal spray kit USE AS DIRECTED     omeprazole (PRILOSEC) 40 MG capsule Take 40 mg by mouth daily.     ondansetron (ZOFRAN-ODT) 8 MG disintegrating tablet Take 8 mg by mouth every 8 (eight) hours as needed.     potassium chloride (KLOR-CON) 10 MEQ tablet Take 20 mEq by mouth 2 (two) times daily.     tirzepatide [facility]) 7.5 MG/0.5ML Pen Inject into the skin.     Vitamin D, Ergocalciferol, (DRISDOL) 1.25 MG (50000 UNIT) CAPS capsule Take 50,000 Units by mouth 2 (two) times a week.     No current facility-administered medications for this visit.     Psychiatric Specialty Exam: Review of Systems  Cardiovascular:  Negative for chest pain.  Neurological:  Negative for tremors.  Psychiatric/Behavioral:  Negative for depression.     Last menstrual period 12/28/2014.There is no height or weight on file to calculate BMI.  General Appearance: casual  Eye Contact:fair  Speech:  Normal Rate  Volume:  Decreased  Mood: fair, somewhat stressed  Affect:  Congruent  Thought Process:  Goal Directed  Orientation:  Full (Time, Place, and Person)  Thought Content:  Logical  Suicidal Thoughts:  No  Homicidal Thoughts:  No  Memory:  Immediate;   Fair Recent;   Fair  Judgement:  Fair  Insight:  Fair  Psychomotor Activity:   Concentration:  Concentration: Fair  and Attention Span: Fair  Recall:  [employer] of Knowledge:Fair  Language: Fair  Akathisia:  No  Handed:  Right  AIMS (if indicated):    Assets:  Desire for Improvement  ADL's:  Intact  Cognition: WNL  Sleep:  fair    Treatment Plan Summary: Medication management and Plan as follows     Prior documentation reviewed   MDD ,recurent moderate: fair continue cymbalta   C/o back pain and knee pain effects mood, understand to continue relavant treatment GAD/ PTSD; basleline, continue cymbalta and buspar  Chronic pain: see above following with providers, pending knee surgery and LP   Insomnia: irregluar at times, go to bed late,  take buspar later part of the day   Work on sleep hygiene   Fu 65m.  Thresa Ross, MD 6/28/202412:07 PM

## 2022-11-14 ENCOUNTER — Other Ambulatory Visit: Payer: Self-pay | Admitting: Family Medicine

## 2022-11-14 DIAGNOSIS — I7300 Raynaud's syndrome without gangrene: Secondary | ICD-10-CM

## 2022-11-15 DIAGNOSIS — M545 Low back pain, unspecified: Secondary | ICD-10-CM | POA: Diagnosis not present

## 2022-11-15 DIAGNOSIS — G8929 Other chronic pain: Secondary | ICD-10-CM | POA: Diagnosis not present

## 2022-11-15 DIAGNOSIS — Z79899 Other long term (current) drug therapy: Secondary | ICD-10-CM | POA: Diagnosis not present

## 2022-11-15 DIAGNOSIS — Z79891 Long term (current) use of opiate analgesic: Secondary | ICD-10-CM | POA: Diagnosis not present

## 2022-12-04 ENCOUNTER — Ambulatory Visit: Payer: 59 | Admitting: Family Medicine

## 2022-12-09 ENCOUNTER — Other Ambulatory Visit: Payer: Self-pay | Admitting: Family Medicine

## 2022-12-15 DIAGNOSIS — Z79891 Long term (current) use of opiate analgesic: Secondary | ICD-10-CM | POA: Diagnosis not present

## 2022-12-15 DIAGNOSIS — M545 Low back pain, unspecified: Secondary | ICD-10-CM | POA: Diagnosis not present

## 2022-12-15 DIAGNOSIS — H539 Unspecified visual disturbance: Secondary | ICD-10-CM | POA: Diagnosis not present

## 2022-12-15 DIAGNOSIS — E119 Type 2 diabetes mellitus without complications: Secondary | ICD-10-CM | POA: Diagnosis not present

## 2022-12-15 DIAGNOSIS — E7800 Pure hypercholesterolemia, unspecified: Secondary | ICD-10-CM | POA: Diagnosis not present

## 2022-12-15 DIAGNOSIS — Z79899 Other long term (current) drug therapy: Secondary | ICD-10-CM | POA: Diagnosis not present

## 2022-12-15 DIAGNOSIS — G8929 Other chronic pain: Secondary | ICD-10-CM | POA: Diagnosis not present

## 2023-01-09 DIAGNOSIS — R1084 Generalized abdominal pain: Secondary | ICD-10-CM | POA: Diagnosis not present

## 2023-01-16 ENCOUNTER — Other Ambulatory Visit: Payer: Self-pay | Admitting: Family Medicine

## 2023-01-16 DIAGNOSIS — Z1231 Encounter for screening mammogram for malignant neoplasm of breast: Secondary | ICD-10-CM

## 2023-01-17 DIAGNOSIS — M545 Low back pain, unspecified: Secondary | ICD-10-CM | POA: Diagnosis not present

## 2023-01-17 DIAGNOSIS — Z79899 Other long term (current) drug therapy: Secondary | ICD-10-CM | POA: Diagnosis not present

## 2023-01-17 DIAGNOSIS — G8929 Other chronic pain: Secondary | ICD-10-CM | POA: Diagnosis not present

## 2023-01-17 DIAGNOSIS — Z79891 Long term (current) use of opiate analgesic: Secondary | ICD-10-CM | POA: Diagnosis not present

## 2023-01-17 DIAGNOSIS — R1013 Epigastric pain: Secondary | ICD-10-CM | POA: Diagnosis not present

## 2023-01-17 DIAGNOSIS — G43909 Migraine, unspecified, not intractable, without status migrainosus: Secondary | ICD-10-CM | POA: Diagnosis not present

## 2023-01-19 DIAGNOSIS — Z79899 Other long term (current) drug therapy: Secondary | ICD-10-CM | POA: Diagnosis not present

## 2023-02-01 DIAGNOSIS — G8929 Other chronic pain: Secondary | ICD-10-CM | POA: Diagnosis not present

## 2023-02-01 DIAGNOSIS — Z8719 Personal history of other diseases of the digestive system: Secondary | ICD-10-CM | POA: Diagnosis not present

## 2023-02-01 DIAGNOSIS — R109 Unspecified abdominal pain: Secondary | ICD-10-CM | POA: Diagnosis not present

## 2023-02-01 DIAGNOSIS — Z720 Tobacco use: Secondary | ICD-10-CM | POA: Diagnosis not present

## 2023-02-11 ENCOUNTER — Other Ambulatory Visit (HOSPITAL_COMMUNITY): Payer: Self-pay | Admitting: Psychiatry

## 2023-02-16 ENCOUNTER — Encounter (HOSPITAL_COMMUNITY): Payer: Self-pay | Admitting: Psychiatry

## 2023-02-16 ENCOUNTER — Telehealth (INDEPENDENT_AMBULATORY_CARE_PROVIDER_SITE_OTHER): Payer: 59 | Admitting: Psychiatry

## 2023-02-16 DIAGNOSIS — G43909 Migraine, unspecified, not intractable, without status migrainosus: Secondary | ICD-10-CM | POA: Diagnosis not present

## 2023-02-16 DIAGNOSIS — F331 Major depressive disorder, recurrent, moderate: Secondary | ICD-10-CM | POA: Diagnosis not present

## 2023-02-16 DIAGNOSIS — F4321 Adjustment disorder with depressed mood: Secondary | ICD-10-CM

## 2023-02-16 DIAGNOSIS — Z79899 Other long term (current) drug therapy: Secondary | ICD-10-CM | POA: Diagnosis not present

## 2023-02-16 DIAGNOSIS — M545 Low back pain, unspecified: Secondary | ICD-10-CM | POA: Diagnosis not present

## 2023-02-16 DIAGNOSIS — F5102 Adjustment insomnia: Secondary | ICD-10-CM

## 2023-02-16 DIAGNOSIS — R1013 Epigastric pain: Secondary | ICD-10-CM | POA: Diagnosis not present

## 2023-02-16 DIAGNOSIS — Z79891 Long term (current) use of opiate analgesic: Secondary | ICD-10-CM | POA: Diagnosis not present

## 2023-02-16 DIAGNOSIS — G8929 Other chronic pain: Secondary | ICD-10-CM | POA: Diagnosis not present

## 2023-02-16 DIAGNOSIS — F411 Generalized anxiety disorder: Secondary | ICD-10-CM | POA: Diagnosis not present

## 2023-02-16 MED ORDER — DULOXETINE HCL 30 MG PO CPEP: ORAL_CAPSULE | ORAL | 0 refills | Status: DC

## 2023-02-16 MED ORDER — BUSPIRONE HCL 7.5 MG PO TABS: 7.5000 mg | ORAL_TABLET | Freq: Every day | ORAL | Status: DC | PRN

## 2023-02-16 NOTE — Progress Notes (Signed)
Follow up tele psych  visit   Patient Identification: Ashley Gomez MRN:  [id number] Date of Evaluation:  02/16/2023 Referral Source: Mary . Counsellor      Chief Complaint:   depression follow up   Visit Diagnosis:    ICD-10-CM   1. Depression, major, recurrent, moderate (HCC)  F33.1 DULoxetine (CYMBALTA) 30 MG capsule    2. Generalized anxiety disorder  F41.1 DULoxetine (CYMBALTA) 30 MG capsule    3. Complicated grief  F43.21     4. Adjustment insomnia  F51.02     Virtual Visit via Video Note  I connected with Ashley Gomez on 02/16/23 at 12:00 PM EDT by a video enabled telemedicine application and verified that I am speaking with the correct person using two identifiers.  Location: Patient: parked car Provider: home office   I discussed the limitations of evaluation and management by telemedicine and the availability of in person appointments. The patient expressed understanding and agreed to proceed.      I discussed the assessment and treatment plan with the patient. The patient was provided an opportunity to ask questions and all were answered. The patient agreed with the plan and demonstrated an understanding of the instructions.   The patient was advised to call back or seek an in-person evaluation if the symptoms worsen or if the condition fails to improve as anticipated.  I provided 15 minutes of non-face-to-face time during this encounter.     History of Present Illness: 52  years old currently single Caucasian female initially referred by her counselor for management of depression anxiety possible PTSD  Has been going thru GI symtpoms and evaluation, concern of pain around abodomen that upsets her.  Overall mood wise feels meds are helping keep balance,  Has herniated disc and nerve ablation  in past    On pain meds for management   Modifying factors; grand kids Aggravating factor: daughters death in past   Severity : pain  effects mood   Past Psychiatric History: depression, anxiety  Previous Psychotropic Medications: Yes   Substance Abuse History in the last 12 months:  No.  Consequences of Substance Abuse: NA  Past Medical History:  Past Medical History:  Diagnosis Date  . Anxiety   . Arthritis   . CVA (cerebral infarction)    Old CVA on MRI brain  . Depression   . DVT (deep venous thrombosis) (HCC) 12/13/2009   L basilic vein   . Facet hypertrophy of lumbar region    MRI 2007  . Fibromyalgia   . GERD (gastroesophageal reflux disease)   . Hypertension   . Migraines   . MS (multiple sclerosis) (HCC)   . Neuromuscular disorder (HCC)   . Obesity   . Pre-diabetes   . PTSD (post-traumatic stress disorder)   . Seizures (HCC) 2011   no seizure since onset  . Smoking   . Stroke [facility])    TIA, >8years ago    Past Surgical History:  Procedure Laterality Date  . CHOLECYSTECTOMY  10/2017  . CHONDROPLASTY Left 12/30/2014   Procedure: CHONDROPLASTY;  Surgeon: Tarry Kos, MD;  Location: [facility];  Service: Orthopedics;  Laterality: Left;  . KNEE ARTHROSCOPY Left 12/30/2014   Procedure: LEFT KNEE ARTHROSCOPY WITH   CHONDROPLASTY;  Surgeon: Tarry Kos, MD;  Location: [facility];  Service: Orthopedics;  Laterality: Left;  . PARTIAL KNEE ARTHROPLASTY Left 08/03/2016   Procedure: LEFT  UNICOMPARTMENTAL KNEE ARTHROPLASTY;  Surgeon: Tarry Kos, MD;  Location: [facility];  Service: Orthopedics;  Laterality: Left;  . SHOULDER ARTHROSCOPY WITH BICEPSTENOTOMY Left 01/15/2020   Procedure: SHOULDER ARTHROSCOPY WITH BICEPSTENOTOMY;  Surgeon: Bjorn Pippin, MD;  Location: [facility];  Service: Orthopedics;  Laterality: Left;  . SHOULDER ARTHROSCOPY WITH DISTAL CLAVICLE RESECTION Left 01/15/2020   Procedure: SHOULDER ARTHROSCOPY WITH DISTAL CLAVICULECTOMY;  Surgeon: Bjorn Pippin, MD;  Location: [facility];  Service: Orthopedics;  Laterality: Left;  .  SHOULDER ARTHROSCOPY WITH ROTATOR CUFF REPAIR AND SUBACROMIAL DECOMPRESSION Left 01/15/2020   Procedure: SHOULDER ARTHROSCOPY DEBRIDMENT WITH ROTATOR CUFF REPAIR, SUBACROMIAL DECOMPRESSION WITH ACROMIPLASTY, AND BICEP TENOTOMY;  Surgeon: Bjorn Pippin, MD;  Location: [facility];  Service: Orthopedics;  Laterality: Left;  . TONSILLECTOMY    . TUBAL LIGATION  1992    Family Psychiatric History: Parents: alcohol use disorder  Family History:  Family History  Problem Relation Age of Onset  . Cancer Mother 30       melanoma  . Hypertension Sister   . Heart disease Sister        valve disease  . Depression Sister   . Diabetes Sister   . Sjogren's syndrome Sister     Social History:   Social History   Socioeconomic History  . Marital status: Divorced    Spouse name: Not on file  . Number of children: 3  . Years of education: 3  . Highest education level: GED or equivalent  Occupational History  . Occupation: Legally disabled  Tobacco Use  . Smoking status: Every Day    Current packs/day: 1.00    Average packs/day: 1 pack/day for 25.0 years (25.0 ttl pk-yrs)    Types: Cigarettes  . Smokeless tobacco: Never  . Tobacco comments:    Patient currently smoking 2-3 cigarettes a day   Vaping Use  . Vaping status: Never Used  Substance and Sexual Activity  . Alcohol use: No    Alcohol/week: 0.0 standard drinks of alcohol  . Drug use: No  . Sexual activity: Yes    Birth control/protection: None, Post-menopausal  Other Topics Concern  . Not on file  Social History Narrative   Her son lives with her after a car accident that he was involved in. She had three children, one has deceased. She enjoys reading, watching television and spending time with her children and grandchildren.   Social Determinants of Health   Financial Resource Strain: Medium Risk (09/17/2022)   Received from [facility], [facility]   Overall Financial Resource Strain (CARDIA)   . Difficulty  of Paying Living Expenses: Somewhat hard  Food Insecurity: Food Insecurity Present (09/17/2022)   Received from [facility], [facility]   Hunger Vital Sign   . Worried About Programme researcher, broadcasting/film/video in the Last Year: Sometimes true   . Ran Out of Food in the Last Year: Sometimes true  Transportation Needs: No Transportation Needs (09/17/2022)   Received from [facility], [facility]   [facility] - Transportation   . Lack of Transportation (Medical): No   . Lack of Transportation (Non-Medical): No  Physical Activity: Insufficiently Active (09/17/2022)   Received from [facility], [facility]   Exercise Vital Sign   . Days of Exercise per Week: 3 days   . Minutes of Exercise per Session: 20 min  Stress: Stress Concern Present (09/17/2022)   Received from [facility], [facility] of  Occupational Health - Occupational Stress Questionnaire   . Feeling of Stress : Rather much  Social Connections: Somewhat Isolated (09/17/2022)   Received from [facility], [facility]   Social Network   . How would you rate your social network (family, work, friends)?: Restricted participation with some degree of social isolation      Allergies:   Allergies  Allergen Reactions  . Celebrex [Celecoxib] Other (See Comments)    Swelling in extremities  . Phenytoin Swelling  . Topamax [Topiramate] Other (See Comments)    She reports that this makes her forgetful and causes her to studder  . Nucynta [Tapentadol] Other (See Comments)    Headaches, abdominal pain   . Ace Inhibitors Cough  . Baclofen Nausea Only  . Celexa [Citalopram Hydrobromide] Other (See Comments)    Dizziness Tolerated Lexapro   . Codeine Nausea Only  . Methocarbamol Nausea Only  . Sumatriptan Nausea Only  . Tizanidine Other (See Comments)    Keeps her awake.   . Tramadol Anxiety and Palpitations  . Triamcinolone Other (See Comments)    Steroid flare after joint injection, use other steroid for  injections    Metabolic Disorder Labs: Lab Results  Component Value Date   HGBA1C 6.0 (H) 06/05/2022   MPG 126 06/05/2022   MPG 126 08/05/2021   Lab Results  Component Value Date   PROLACTIN 4.3 03/04/2013   Lab Results  Component Value Date   CHOL 207 (H) 08/09/2020   TRIG 422 (H) 08/09/2020   HDL 33 (L) 08/09/2020   CHOLHDL 6.3 (H) 08/09/2020   VLDL 30 11/29/2016   LDLCALC  08/09/2020     Comment:     . LDL cholesterol not calculated. Triglyceride levels greater than 400 mg/dL invalidate calculated LDL results. . Reference range: <100 . Desirable range <100 mg/dL for primary prevention;   <70 mg/dL for patients with CHD or diabetic patients  with > or = 2 CHD risk factors. Marland Kitchen LDL-C is now calculated using the Martin-Hopkins  calculation, which is a validated novel method providing  better accuracy than the Friedewald equation in the  estimation of LDL-C.  Horald Pollen et al. Lenox Ahr. 7829;562(13): 2061-2068  (http://education.[employer].com/faq/FAQ164)    LDLCALC 98 08/04/2019     Current Medications: Current Outpatient Medications  Medication Sig Dispense Refill  . acetaminophen (TYLENOL) 500 MG tablet Take by mouth.    Marland Kitchen amLODipine (NORVASC) 5 MG tablet TAKE 1 TABLET (5 MG TOTAL) BY MOUTH DAILY. 90 tablet 1  . atorvastatin (LIPITOR) 20 MG tablet TAKE 1 TABLET BY MOUTH EVERYDAY AT BEDTIME 90 tablet 3  . busPIRone (BUSPAR) 7.5 MG tablet Take 1 tablet (7.5 mg total) by mouth daily as needed.    . chlorthalidone (HYGROTON) 25 MG tablet Take 1 tablet by mouth daily.    . clindamycin (CLEOCIN T) 1 % external solution Apply topically 2 (two) times daily.    . diazepam (VALIUM) 5 MG tablet Take 1 tablet (5 mg total) by mouth daily as needed for anxiety. 30 tablet 3  . diclofenac Sodium (VOLTAREN) 1 % GEL Apply 4 g topically 4 (four) times daily. To affected joint. 100 g 11  . DULoxetine (CYMBALTA) 30 MG capsule YAKE WITH 60MG  . TOTAL DAILY DOSE IS  90 MG 90 capsule 0   . DULoxetine (CYMBALTA) 60 MG capsule TAKE 1 CAPSULE BY MOUTH EVERY DAY 90 capsule 0  . famotidine (PEPCID) 20 MG tablet TAKE 1 TABLET BY MOUTH EVERY DAY 90 tablet 3  .  HYDROmorphone (DILAUDID) 8 MG tablet Take 1 tablet by mouth every 8 (eight) hours as needed.    Marland Kitchen levocetirizine (XYZAL) 5 MG tablet Take 5 mg by mouth daily.    . metoprolol succinate (TOPROL-XL) 25 MG 24 hr tablet TAKE 1 TABLET BY MOUTH EVERY DAY 90 tablet 0  . mupirocin ointment (BACTROBAN) 2 % SMARTSIG:1 Application Topical 2-3 Times Daily    . naratriptan (AMERGE) 2.5 MG tablet Take 1 tablet (2.5 mg total) by mouth as needed for migraine. Take one (1) tablet at onset of headache; if returns or does not resolve, may repeat after 4 hours; do not exceed five (5) mg in 24 hours. 10 tablet 0  . NARCAN 4 MG/0.1ML LIQD nasal spray kit USE AS DIRECTED    . omeprazole (PRILOSEC) 40 MG capsule Take 40 mg by mouth daily.    . ondansetron (ZOFRAN-ODT) 8 MG disintegrating tablet Take 8 mg by mouth every 8 (eight) hours as needed.    . potassium chloride (KLOR-CON) 10 MEQ tablet Take 20 mEq by mouth 2 (two) times daily.    . tirzepatide (MOUNJARO) 7.5 MG/0.5ML Pen Inject into the skin.    . Vitamin D, Ergocalciferol, (DRISDOL) 1.25 MG (50000 UNIT) CAPS capsule Take 50,000 Units by mouth 2 (two) times a week.     No current facility-administered medications for this visit.     Psychiatric Specialty Exam: Review of Systems  Cardiovascular:  Negative for chest pain.  Neurological:  Negative for tremors.  Psychiatric/Behavioral:  Negative for depression.     Last menstrual period 12/28/2014.There is no height or weight on file to calculate BMI.  General Appearance: casual  Eye Contact:fair  Speech:  Normal Rate  Volume:  Decreased  Mood: fair, somewhat stressed  Affect:  Congruent  Thought Process:  Goal Directed  Orientation:  Full (Time, Place, and Person)  Thought Content:  Logical  Suicidal Thoughts:  No  Homicidal  Thoughts:  No  Memory:  Immediate;   Fair Recent;   Fair  Judgement:  Fair  Insight:  Fair  Psychomotor Activity:   Concentration:  Concentration: Fair and Attention Span: Fair  Recall:  [employer] of Knowledge:Fair  Language: Fair  Akathisia:  No  Handed:  Right  AIMS (if indicated):    Assets:  Desire for Improvement  ADL's:  Intact  Cognition: WNL  Sleep:  fair    Treatment Plan Summary: Medication management and Plan as follows     Prior documentation reviewed    MDD ,recurent moderate: manageable continue cymbalta 30 plus 60mg   GAD/ PTSD; baseline pain can effect mood. Continue cymbalta , now buspar is once a day  She plans to talk with PCP to wean off from valium as well  Chronic pain: has herniated disc, also being evaluated for GI pain, she has appointment   Insomnia: manageable continue sleep hygiene   Fu 70m.  Thresa Ross, MD 10/4/202412:05 PM

## 2023-02-19 ENCOUNTER — Other Ambulatory Visit: Payer: Self-pay | Admitting: Sports Medicine

## 2023-02-19 ENCOUNTER — Ambulatory Visit (INDEPENDENT_AMBULATORY_CARE_PROVIDER_SITE_OTHER): Payer: 59 | Admitting: Licensed Clinical Social Worker

## 2023-02-19 DIAGNOSIS — F4321 Adjustment disorder with depressed mood: Secondary | ICD-10-CM | POA: Diagnosis not present

## 2023-02-19 DIAGNOSIS — F411 Generalized anxiety disorder: Secondary | ICD-10-CM

## 2023-02-19 DIAGNOSIS — G894 Chronic pain syndrome: Secondary | ICD-10-CM

## 2023-02-19 DIAGNOSIS — M19022 Primary osteoarthritis, left elbow: Secondary | ICD-10-CM

## 2023-02-19 DIAGNOSIS — F5102 Adjustment insomnia: Secondary | ICD-10-CM | POA: Diagnosis not present

## 2023-02-19 DIAGNOSIS — F4310 Post-traumatic stress disorder, unspecified: Secondary | ICD-10-CM

## 2023-02-19 DIAGNOSIS — F331 Major depressive disorder, recurrent, moderate: Secondary | ICD-10-CM | POA: Diagnosis not present

## 2023-02-19 NOTE — Progress Notes (Signed)
THERAPIST PROGRESS NOTE  Session Time: 1:00 PM to 1:47 PM  Participation Level: Active  Behavioral Response: CasualAlertappropriate  Type of Therapy: Individual Therapy  Treatment Goals addressed: Patient wants to focus on trauma symptoms will continue with coping for depression and anxiety grief, coping  ProgressTowards Goals: Progressing-reviewed treatment goals continue to work on goals worked on patient's trauma symptoms as well as management of her stressors  Interventions: Solution Focused, Strength-based, Supportive, and Other: Coping  Summary: Ashley Gomez is a 52 y.o. female who presents with after hip replacement one thing after another started happening got to point with provides where thought can't do this. She has stayed with doctor for stomach issues.  Patient switched all her medical services to 1 doctor to doctor who manages her pain, [facility]. Having a lot of stomach problems since hip replaced something different happening with stomach and migraines come back for some reason. Problem with stomach on right side no answers so many CT scans without answers. Seeing the gastro-specialist. Fed up if don't want to continue because not accomplishing anything then patient is moving on. PCP has one more doctor wants to send her to. If don't help not coming back.  Talked about regular schedule patient wakes up early but does not get going to about this time has migraines again, regularly maybe 2 days were feels good and that is not until about this time. Life is migraines and pain.  Patient said had another bout of diverticulitis Last month lost [name] this month her birthday preparing herself and brings up everything not as bad when it happened 10 years ago. Try to make herself get things from funeral never looked at anything. 10 years the 7th of September. Going to start looking at the things. 5-6 months ago started journal about what happened to her as a child. Mom still not  talking to her 4 years later. Write things to do with way is happening for her now that bother her and trace back to what think was wrong, looking an underlying events that happened. Makes her mad when think about things from childhood and how affects her today. Makes her mad patient is grown, grandmother all the happy things happening and still let her affect her. Certain things happen trigger comes from nowhere something that comes when 52 years old elementary and middle school. Writing down not sure what for venting on paper. Would help if did more decide not to doesn't want to ruin her day. Unreal to her that mom can do some of the things she did things she said and how acted unreal to patient especially patient as mom. Knows she is not her don't act that way why trying to understand why she was the way she was and what happened. Talked about dynamic with aunt and mom over that like high school.  Wants starts getting messages from mom if she wants patient to be more responsive to mom and patient does not want that she stops talking to patient. Therapist brought up anger issues and what we we feel is healthy for patient or not therapist does agree with patient that getting it out is therapeutically identifying  getting out her feelings processing her feelings is a good thing.  Talked about history with mom she didn't want them ruined her life when pregnant give to a children's home. Reason she gave was not good enough they are only 2-3 years why didn't keep. Not always on her mind doesn't let it  bother only when certain instances come up bothers her. For example when aunt says that is your mom should be there and not there for her. Not always thinking on mind only when triggered.   Therapist able to catch up with patient has not seen since February reviewed treatment plan in session patient gave verbal consent to complete related working on the same goals but catching up on working on the goals.  In our  discussion for fraught with medical issues patient describes her approach if she is not getting the help she needs she moves onto another doctor now being managed by pain doctor for a lot of issues and still has migraines and stomach issues..  We focused on her trauma issues that she has started a journal will write down past episodes that have happened with her mom.  Therapist validated patient that symptoms come from what has happened in the past so working through that.  Working through that can be reviewing the episode to better regulate, challenging thoughts about what happened stuck points that keep 1 stuck in trauma just getting it out helps through journaling.  Looked at patient's anger explored with her whether it something needs to be worked through patient made a good point that talking about her emotions expressing her emotions is therapeutic for her.  Noted she is more focused on herself this is progress as well as some changes in medications that been healthy as well.  Talked briefly about family issues about issues with communication with her daughter but things are a lot better, [name] is with patient continues to be a parental role in her life.  Therapist provided support and space for patient to talk about thoughts and feelings in session. Suicidal/Homicidal: No  Plan: Return again in 5 weeks.2.  Patient look at CPT app continue to work on trauma as well as coping skills  Diagnosis: Generalized anxiety disorder PTSD, complicated grief, chronic pain syndrome, Major depressive disorder, recurrent moderate  Collaboration of Care: Other none needed  Patient/Guardian was advised Release of Information must be obtained prior to any record release in order to collaborate their care with an outside provider. Patient/Guardian was advised if they have not already done so to contact the registration department to sign all necessary forms in order for [name] to release information regarding their care.    Consent: Patient/Guardian gives verbal consent for treatment and assignment of benefits for services provided during this visit. Patient/Guardian expressed understanding and agreed to proceed.   Coolidge Breeze, LCSW 02/19/2023

## 2023-02-20 NOTE — Telephone Encounter (Signed)
Has not been seen by me in over a year, routing to PCP

## 2023-02-22 ENCOUNTER — Ambulatory Visit: Payer: 59

## 2023-02-22 DIAGNOSIS — Z1231 Encounter for screening mammogram for malignant neoplasm of breast: Secondary | ICD-10-CM

## 2023-02-27 ENCOUNTER — Other Ambulatory Visit: Payer: Self-pay | Admitting: Family Medicine

## 2023-02-27 NOTE — Progress Notes (Signed)
Please call patient. Normal mammogram.  Repeat in 1 year.  

## 2023-03-01 DIAGNOSIS — M4722 Other spondylosis with radiculopathy, cervical region: Secondary | ICD-10-CM | POA: Diagnosis not present

## 2023-03-01 DIAGNOSIS — R202 Paresthesia of skin: Secondary | ICD-10-CM | POA: Diagnosis not present

## 2023-03-19 DIAGNOSIS — M545 Low back pain, unspecified: Secondary | ICD-10-CM | POA: Diagnosis not present

## 2023-03-19 DIAGNOSIS — Z79899 Other long term (current) drug therapy: Secondary | ICD-10-CM | POA: Diagnosis not present

## 2023-03-19 DIAGNOSIS — F17210 Nicotine dependence, cigarettes, uncomplicated: Secondary | ICD-10-CM | POA: Diagnosis not present

## 2023-03-19 DIAGNOSIS — Z760 Encounter for issue of repeat prescription: Secondary | ICD-10-CM | POA: Diagnosis not present

## 2023-03-19 DIAGNOSIS — Z79891 Long term (current) use of opiate analgesic: Secondary | ICD-10-CM | POA: Diagnosis not present

## 2023-03-19 DIAGNOSIS — Z23 Encounter for immunization: Secondary | ICD-10-CM | POA: Diagnosis not present

## 2023-03-19 DIAGNOSIS — G8929 Other chronic pain: Secondary | ICD-10-CM | POA: Diagnosis not present

## 2023-03-22 DIAGNOSIS — Z79899 Other long term (current) drug therapy: Secondary | ICD-10-CM | POA: Diagnosis not present

## 2023-03-28 ENCOUNTER — Ambulatory Visit (HOSPITAL_COMMUNITY): Payer: 59 | Admitting: Licensed Clinical Social Worker

## 2023-04-02 ENCOUNTER — Other Ambulatory Visit: Payer: Self-pay | Admitting: Family Medicine

## 2023-04-02 ENCOUNTER — Other Ambulatory Visit: Payer: Self-pay | Admitting: Sports Medicine

## 2023-04-02 DIAGNOSIS — M19022 Primary osteoarthritis, left elbow: Secondary | ICD-10-CM

## 2023-04-02 NOTE — Telephone Encounter (Signed)
Hasn't been seen in over a year by me

## 2023-04-04 NOTE — Telephone Encounter (Signed)
Please call pt she will need an appointment for refills on this medication.

## 2023-04-04 NOTE — Telephone Encounter (Signed)
I called pt and left her a vm and let her know she is due for a F/u on her meds so she can get refills.

## 2023-04-10 ENCOUNTER — Other Ambulatory Visit (HOSPITAL_COMMUNITY): Payer: Self-pay | Admitting: Psychiatry

## 2023-04-16 ENCOUNTER — Ambulatory Visit (HOSPITAL_COMMUNITY): Payer: 59 | Admitting: Licensed Clinical Social Worker

## 2023-04-16 ENCOUNTER — Encounter (HOSPITAL_COMMUNITY): Payer: Self-pay

## 2023-04-16 DIAGNOSIS — M545 Low back pain, unspecified: Secondary | ICD-10-CM | POA: Diagnosis not present

## 2023-04-16 DIAGNOSIS — G8929 Other chronic pain: Secondary | ICD-10-CM | POA: Diagnosis not present

## 2023-04-16 DIAGNOSIS — Z79899 Other long term (current) drug therapy: Secondary | ICD-10-CM | POA: Diagnosis not present

## 2023-04-16 DIAGNOSIS — Z79891 Long term (current) use of opiate analgesic: Secondary | ICD-10-CM | POA: Diagnosis not present

## 2023-04-16 NOTE — Progress Notes (Signed)
Patient did not show for session 

## 2023-04-18 DIAGNOSIS — Z96641 Presence of right artificial hip joint: Secondary | ICD-10-CM | POA: Diagnosis not present

## 2023-04-19 DIAGNOSIS — Z79899 Other long term (current) drug therapy: Secondary | ICD-10-CM | POA: Diagnosis not present

## 2023-04-20 DIAGNOSIS — M4726 Other spondylosis with radiculopathy, lumbar region: Secondary | ICD-10-CM | POA: Diagnosis not present

## 2023-04-20 DIAGNOSIS — M4807 Spinal stenosis, lumbosacral region: Secondary | ICD-10-CM | POA: Diagnosis not present

## 2023-04-20 DIAGNOSIS — M5416 Radiculopathy, lumbar region: Secondary | ICD-10-CM | POA: Diagnosis not present

## 2023-04-20 DIAGNOSIS — M4727 Other spondylosis with radiculopathy, lumbosacral region: Secondary | ICD-10-CM | POA: Diagnosis not present

## 2023-04-20 DIAGNOSIS — M5116 Intervertebral disc disorders with radiculopathy, lumbar region: Secondary | ICD-10-CM | POA: Diagnosis not present

## 2023-04-20 DIAGNOSIS — M4722 Other spondylosis with radiculopathy, cervical region: Secondary | ICD-10-CM | POA: Diagnosis not present

## 2023-04-25 DIAGNOSIS — Z96641 Presence of right artificial hip joint: Secondary | ICD-10-CM | POA: Diagnosis not present

## 2023-04-25 DIAGNOSIS — Z471 Aftercare following joint replacement surgery: Secondary | ICD-10-CM | POA: Diagnosis not present

## 2023-04-26 DIAGNOSIS — S63502A Unspecified sprain of left wrist, initial encounter: Secondary | ICD-10-CM | POA: Diagnosis not present

## 2023-04-26 DIAGNOSIS — G894 Chronic pain syndrome: Secondary | ICD-10-CM | POA: Diagnosis not present

## 2023-04-26 DIAGNOSIS — M47816 Spondylosis without myelopathy or radiculopathy, lumbar region: Secondary | ICD-10-CM | POA: Diagnosis not present

## 2023-04-26 DIAGNOSIS — M79632 Pain in left forearm: Secondary | ICD-10-CM | POA: Diagnosis not present

## 2023-04-26 DIAGNOSIS — M25532 Pain in left wrist: Secondary | ICD-10-CM | POA: Diagnosis not present

## 2023-04-30 ENCOUNTER — Ambulatory Visit (INDEPENDENT_AMBULATORY_CARE_PROVIDER_SITE_OTHER): Payer: 59 | Admitting: Licensed Clinical Social Worker

## 2023-04-30 DIAGNOSIS — G894 Chronic pain syndrome: Secondary | ICD-10-CM

## 2023-04-30 DIAGNOSIS — F4321 Adjustment disorder with depressed mood: Secondary | ICD-10-CM

## 2023-04-30 DIAGNOSIS — F331 Major depressive disorder, recurrent, moderate: Secondary | ICD-10-CM

## 2023-04-30 DIAGNOSIS — F411 Generalized anxiety disorder: Secondary | ICD-10-CM

## 2023-04-30 DIAGNOSIS — F5102 Adjustment insomnia: Secondary | ICD-10-CM

## 2023-04-30 DIAGNOSIS — F4310 Post-traumatic stress disorder, unspecified: Secondary | ICD-10-CM

## 2023-04-30 NOTE — Progress Notes (Signed)
Virtual Visit via Video Note  I connected with Ashley Gomez on 04/30/23 at  1:00 PM EST by a video enabled telemedicine application and verified that I am speaking with the correct person using two identifiers.  Location: Patient: home Provider: office   I discussed the limitations of evaluation and management by telemedicine and the availability of in person appointments. The patient expressed understanding and agreed to proceed.   I discussed the assessment and treatment plan with the patient. The patient was provided an opportunity to ask questions and all were answered. The patient agreed with the plan and demonstrated an understanding of the instructions.   The patient was advised to call back or seek an in-person evaluation if the symptoms worsen or if the condition fails to improve as anticipated.  I provided 43 minutes of non-face-to-face time during this encounter.  THERAPIST PROGRESS NOTE  Session Time: 1:00 PM to 1:43 PM  Participation Level: Active  Behavioral Response: CasualAlertDysphoric  Type of Therapy: Individual Therapy  Treatment Goals addressed: Patient wants to focus on trauma symptoms will continue with coping for depression and anxiety grief, coping  ProgressTowards Goals: Progressing-patient dealing with ongoing medical issues makes it more difficult assess helpful for patient to process thoughts and feelings in session to help with coping  Interventions: Solution Focused, Strength-based, Supportive, and Other: Coping  Summary: Ashley Gomez is a 52 y.o. female who presents with fell Wednesday evening and hurt wrist, back side. Whole left side. Where hurting so bad headache. Always have a headache and where fell body so sore. Therapist asked how she fell. Watching a friend's kid, a 33 year old walked back to bathroom he said made her fall, feels bad honestly patient doesn't remember. He stays right next to her but was talking to Ashley Gomez next thing she  walked in the bathroom and fell. Has a brace for arm. Was at [facility] for back nerve ablation burn nerves in back since there did x-rays sent to orthopedic. Still waiting from doctor Dr. Christell Gomez his understanding from hip surgeon. CT scan did the ex-ray pain never gone away 4 months after hip surgery. Says something like an extra bone. Not supposed to be a rare thing after injury and replacement procedure. Patient actually coming top of leg from hip replacement. Hurting and bothering her since 4 months after hip replacement getting worse starting to walk weird because of pain had hip surgery a year ago November 27.  Christmas still doesn't put in a great mood. Three still don't see have to figure out how to see them they are in [place]. Hopes to get there right before or after Christmas. Ashley Gomez has diabetes. Still has to learn about that. Before eats how many carbs divide by 18 how much insulin he can take. Told him to Google it. Christmas going to be good never the same after losing Ashley Gomez and Ashley Gomez. Ashley Gomez is still in [place]. Gone since June. He still has medical issues helping somebody take things to scrap yard.  Patient is not doing that much journaling haven't wrote in a couple weeks. Has three journals either  in pain, headache or with kids haven't had the time. Friday last day of school for kids. Out until the 6th of January. Think patient and Ashley Gomez will go to cemetary put on flowers. Attitude is better. Thinks have helped her for patient and her to have relationship. She realizes patient serious about consequences. Maturing. Her and Ashley Gomez still clash gets along with everyone else.  Talked to Ashley Gomez and Ashley See ([place]) yesterday and today 2-20 times a day Ashley Gomez calls. When think one thing taken care of then another. Thinks headaches is stress.  Sister has been trying to get patient down to see her.  Thinks Ashley Gomez goes back and forth with pressuring patient to talk to Ashley Gomez. Ashley Gomez quit talking 2-3 weeks before  talking every day now talking to patient again.  Therapy once a month to 6 weeks. Been through so much therapist picks up and she said accurate a sense of "whatever". Would like to work been so long went to [employer] concert had to leave too many people closed into a box. Sat at end of bleacher. Too many people can't go to grocery store if have to shop run in and get things but can't stay in anxiety through the roof, sweating, stomach hurting. Like you are getting scared about something. Doesn't know why? delivery loves her. It has been several years like this. Home a lot. Ok run to [employer] don't go anywhere unless have doctor's appointment.  The ones in [place] hasn't seen since first of August. Last year spent a week before Christmas this year she won't let them come She is trying to get baby Ashley Gomez sent away. She doesn't think send  away but is a group home says he is a mean kid. The other grandmom she has 10 times what is going on with patient. In patient's opinion Ashley Gomez is the way he is never took to park, yard with bike when with patient is not like that. He goes out and able to do things. She keeps them isolated at the house. No interaction with people patient asks how do they how supposed to act.  They are the type of people hold things against her turn it around something and tell 63 and 54 year old.  Things like she is busy doing other things.  Patient has ongoing medical issues talked about that recent fall has affected her showed therapist her brace provider use empathetic listening, direct questioning discussed current and underlying problems we talked about Christmas whether she is excited patient says will never be the same although is close with her grandchildren will celebrate holidays with them.  Therapist noted Ashley Gomez more open to things related to her Ashley Gomez patient agreed notices a different she still teenager but maturing a little bit therapist thought that was a positive sign.  We  checked in on family how people were doing.  Main issue continues to be as patient said things solved issues and then another 1 pops up also has ongoing issues with stomach that appear related to hip replacement.  Assess helpful for patient to have an outlet to talk about thoughts and feelings help process help with coping          Suicidal/Homicidal: No  Plan: Return again in 3 weeks.2.  Patient look at CPT app continue to work on trauma as well as coping skills  Diagnosis: Generalized anxiety disorder PTSD, complicated grief, chronic pain syndrome, Major depressive disorder, recurrent moderate  Collaboration of Care: Other none needed  Patient/Guardian was advised Release of Information must be obtained prior to any record release in order to collaborate their care with an outside provider. Patient/Guardian was advised if they have not already done so to contact the registration department to sign all necessary forms in order for [name] to release information regarding their care.   Consent: Patient/Guardian gives verbal consent for treatment and assignment of benefits for  services provided during this visit. Patient/Guardian expressed understanding and agreed to proceed.   Coolidge Breeze, LCSW 04/30/2023

## 2023-05-14 DIAGNOSIS — G8929 Other chronic pain: Secondary | ICD-10-CM | POA: Diagnosis not present

## 2023-05-14 DIAGNOSIS — Z79891 Long term (current) use of opiate analgesic: Secondary | ICD-10-CM | POA: Diagnosis not present

## 2023-05-14 DIAGNOSIS — Z79899 Other long term (current) drug therapy: Secondary | ICD-10-CM | POA: Diagnosis not present

## 2023-05-14 DIAGNOSIS — M545 Low back pain, unspecified: Secondary | ICD-10-CM | POA: Diagnosis not present

## 2023-05-14 DIAGNOSIS — K219 Gastro-esophageal reflux disease without esophagitis: Secondary | ICD-10-CM | POA: Diagnosis not present

## 2023-05-17 DIAGNOSIS — Z79899 Other long term (current) drug therapy: Secondary | ICD-10-CM | POA: Diagnosis not present

## 2023-05-21 ENCOUNTER — Encounter (HOSPITAL_COMMUNITY): Payer: Self-pay

## 2023-05-21 ENCOUNTER — Ambulatory Visit (INDEPENDENT_AMBULATORY_CARE_PROVIDER_SITE_OTHER): Payer: Self-pay | Admitting: Licensed Clinical Social Worker

## 2023-05-21 ENCOUNTER — Encounter (HOSPITAL_COMMUNITY): Payer: Self-pay | Admitting: Licensed Clinical Social Worker

## 2023-05-21 DIAGNOSIS — Z91199 Patient's noncompliance with other medical treatment and regimen due to unspecified reason: Secondary | ICD-10-CM

## 2023-05-21 NOTE — Progress Notes (Signed)
 Patient did not show for appointment.

## 2023-05-22 DIAGNOSIS — L299 Pruritus, unspecified: Secondary | ICD-10-CM | POA: Diagnosis not present

## 2023-05-22 DIAGNOSIS — C44622 Squamous cell carcinoma of skin of right upper limb, including shoulder: Secondary | ICD-10-CM | POA: Diagnosis not present

## 2023-05-22 DIAGNOSIS — L821 Other seborrheic keratosis: Secondary | ICD-10-CM | POA: Diagnosis not present

## 2023-05-22 DIAGNOSIS — L570 Actinic keratosis: Secondary | ICD-10-CM | POA: Diagnosis not present

## 2023-05-25 ENCOUNTER — Telehealth (HOSPITAL_COMMUNITY): Payer: 59 | Admitting: Psychiatry

## 2023-06-02 ENCOUNTER — Other Ambulatory Visit: Payer: Self-pay | Admitting: Family Medicine

## 2023-06-08 NOTE — Telephone Encounter (Signed)
Please call pt and advise her that she is due for f/u appt for BP check and labs it has been 1 year since she has been seen.   Thanks!!

## 2023-06-08 NOTE — Telephone Encounter (Signed)
Called patient, LVM to call office to schedule appt, thanks.

## 2023-06-13 ENCOUNTER — Telehealth: Payer: Self-pay | Admitting: Family Medicine

## 2023-06-13 DIAGNOSIS — C44622 Squamous cell carcinoma of skin of right upper limb, including shoulder: Secondary | ICD-10-CM | POA: Diagnosis not present

## 2023-06-13 NOTE — Telephone Encounter (Signed)
Call pt: Needs appt for f/u BP and DM

## 2023-06-15 DIAGNOSIS — I10 Essential (primary) hypertension: Secondary | ICD-10-CM | POA: Diagnosis not present

## 2023-06-15 DIAGNOSIS — G8929 Other chronic pain: Secondary | ICD-10-CM | POA: Diagnosis not present

## 2023-06-15 DIAGNOSIS — E119 Type 2 diabetes mellitus without complications: Secondary | ICD-10-CM | POA: Diagnosis not present

## 2023-06-15 DIAGNOSIS — M545 Low back pain, unspecified: Secondary | ICD-10-CM | POA: Diagnosis not present

## 2023-06-15 DIAGNOSIS — Z79891 Long term (current) use of opiate analgesic: Secondary | ICD-10-CM | POA: Diagnosis not present

## 2023-06-15 DIAGNOSIS — E559 Vitamin D deficiency, unspecified: Secondary | ICD-10-CM | POA: Diagnosis not present

## 2023-06-15 DIAGNOSIS — E7800 Pure hypercholesterolemia, unspecified: Secondary | ICD-10-CM | POA: Diagnosis not present

## 2023-07-07 ENCOUNTER — Other Ambulatory Visit (HOSPITAL_COMMUNITY): Payer: Self-pay | Admitting: Psychiatry

## 2023-07-10 DIAGNOSIS — R202 Paresthesia of skin: Secondary | ICD-10-CM | POA: Diagnosis not present

## 2023-07-11 ENCOUNTER — Other Ambulatory Visit: Payer: Self-pay | Admitting: Family Medicine

## 2023-07-12 DIAGNOSIS — Z79891 Long term (current) use of opiate analgesic: Secondary | ICD-10-CM | POA: Diagnosis not present

## 2023-07-12 DIAGNOSIS — Z79899 Other long term (current) drug therapy: Secondary | ICD-10-CM | POA: Diagnosis not present

## 2023-07-12 DIAGNOSIS — G8929 Other chronic pain: Secondary | ICD-10-CM | POA: Diagnosis not present

## 2023-07-12 DIAGNOSIS — M545 Low back pain, unspecified: Secondary | ICD-10-CM | POA: Diagnosis not present

## 2023-07-13 NOTE — Telephone Encounter (Signed)
 Please call pt she is overdue for OV for bp check she has not been seen since 06/05/2022 she will also need fasting labs. thanks

## 2023-07-13 NOTE — Telephone Encounter (Signed)
 Called patient, LVM to call office to schedule OV, thanks.

## 2023-07-19 DIAGNOSIS — Z79899 Other long term (current) drug therapy: Secondary | ICD-10-CM | POA: Diagnosis not present

## 2023-07-26 DIAGNOSIS — J0100 Acute maxillary sinusitis, unspecified: Secondary | ICD-10-CM | POA: Diagnosis not present

## 2023-08-02 ENCOUNTER — Encounter: Payer: Self-pay | Admitting: Family Medicine

## 2023-08-02 DIAGNOSIS — K5790 Diverticulosis of intestine, part unspecified, without perforation or abscess without bleeding: Secondary | ICD-10-CM | POA: Diagnosis not present

## 2023-08-02 DIAGNOSIS — I10 Essential (primary) hypertension: Secondary | ICD-10-CM | POA: Diagnosis not present

## 2023-08-02 DIAGNOSIS — K5730 Diverticulosis of large intestine without perforation or abscess without bleeding: Secondary | ICD-10-CM | POA: Diagnosis not present

## 2023-08-02 DIAGNOSIS — F17210 Nicotine dependence, cigarettes, uncomplicated: Secondary | ICD-10-CM | POA: Diagnosis not present

## 2023-08-02 DIAGNOSIS — Z8669 Personal history of other diseases of the nervous system and sense organs: Secondary | ICD-10-CM | POA: Diagnosis not present

## 2023-08-02 DIAGNOSIS — Z8719 Personal history of other diseases of the digestive system: Secondary | ICD-10-CM | POA: Diagnosis not present

## 2023-08-02 DIAGNOSIS — E119 Type 2 diabetes mellitus without complications: Secondary | ICD-10-CM | POA: Diagnosis not present

## 2023-08-02 DIAGNOSIS — Z7985 Long-term (current) use of injectable non-insulin antidiabetic drugs: Secondary | ICD-10-CM | POA: Diagnosis not present

## 2023-08-02 DIAGNOSIS — M069 Rheumatoid arthritis, unspecified: Secondary | ICD-10-CM | POA: Diagnosis not present

## 2023-08-02 DIAGNOSIS — G35 Multiple sclerosis: Secondary | ICD-10-CM | POA: Diagnosis not present

## 2023-08-02 DIAGNOSIS — E279 Disorder of adrenal gland, unspecified: Secondary | ICD-10-CM | POA: Diagnosis not present

## 2023-08-02 DIAGNOSIS — R110 Nausea: Secondary | ICD-10-CM | POA: Diagnosis not present

## 2023-08-02 DIAGNOSIS — R109 Unspecified abdominal pain: Secondary | ICD-10-CM | POA: Diagnosis not present

## 2023-08-02 DIAGNOSIS — Z8673 Personal history of transient ischemic attack (TIA), and cerebral infarction without residual deficits: Secondary | ICD-10-CM | POA: Diagnosis not present

## 2023-08-02 DIAGNOSIS — K219 Gastro-esophageal reflux disease without esophagitis: Secondary | ICD-10-CM | POA: Diagnosis not present

## 2023-08-02 DIAGNOSIS — Z888 Allergy status to other drugs, medicaments and biological substances status: Secondary | ICD-10-CM | POA: Diagnosis not present

## 2023-08-02 DIAGNOSIS — Z886 Allergy status to analgesic agent status: Secondary | ICD-10-CM | POA: Diagnosis not present

## 2023-08-02 DIAGNOSIS — Z79899 Other long term (current) drug therapy: Secondary | ICD-10-CM | POA: Diagnosis not present

## 2023-08-02 DIAGNOSIS — R1012 Left upper quadrant pain: Secondary | ICD-10-CM | POA: Diagnosis not present

## 2023-08-02 DIAGNOSIS — Z885 Allergy status to narcotic agent status: Secondary | ICD-10-CM | POA: Diagnosis not present

## 2023-08-02 NOTE — Telephone Encounter (Signed)
 I do not know if Ashley Gomez has any appointments today but we can try to get her in in the office I just cannot squeeze her in my schedule is packed.  I do think urgent care would be a reasonable option but not our urgent care because we do not have CT available today.

## 2023-08-08 ENCOUNTER — Other Ambulatory Visit (HOSPITAL_COMMUNITY): Payer: Self-pay | Admitting: Psychiatry

## 2023-08-08 ENCOUNTER — Telehealth (HOSPITAL_COMMUNITY): Payer: Self-pay | Admitting: *Deleted

## 2023-08-08 ENCOUNTER — Other Ambulatory Visit: Payer: Self-pay | Admitting: Family Medicine

## 2023-08-08 DIAGNOSIS — F411 Generalized anxiety disorder: Secondary | ICD-10-CM

## 2023-08-08 DIAGNOSIS — F331 Major depressive disorder, recurrent, moderate: Secondary | ICD-10-CM

## 2023-08-08 DIAGNOSIS — I7300 Raynaud's syndrome without gangrene: Secondary | ICD-10-CM

## 2023-08-08 NOTE — Telephone Encounter (Signed)
 LVM FOR PATIENT TO SCHEDULE APPT LAST SEEN 02/16/23 5 MONTHS Rx REFILL WILL SEND ONCE APPT SCHEDULED

## 2023-08-09 DIAGNOSIS — G8929 Other chronic pain: Secondary | ICD-10-CM | POA: Diagnosis not present

## 2023-08-09 DIAGNOSIS — I10 Essential (primary) hypertension: Secondary | ICD-10-CM | POA: Diagnosis not present

## 2023-08-09 DIAGNOSIS — Z79891 Long term (current) use of opiate analgesic: Secondary | ICD-10-CM | POA: Diagnosis not present

## 2023-08-09 DIAGNOSIS — M545 Low back pain, unspecified: Secondary | ICD-10-CM | POA: Diagnosis not present

## 2023-08-09 DIAGNOSIS — Z79899 Other long term (current) drug therapy: Secondary | ICD-10-CM | POA: Diagnosis not present

## 2023-08-13 ENCOUNTER — Telehealth (HOSPITAL_COMMUNITY): Payer: Self-pay | Admitting: *Deleted

## 2023-08-13 DIAGNOSIS — Z79899 Other long term (current) drug therapy: Secondary | ICD-10-CM | POA: Diagnosis not present

## 2023-08-13 MED ORDER — DULOXETINE HCL 60 MG PO CPEP: 60.0000 mg | ORAL_CAPSULE | Freq: Every day | ORAL | 0 refills | Status: DC

## 2023-08-13 NOTE — Telephone Encounter (Signed)
 RX REFILL REQUEST # [id number] [id number] - [place], [place] - [address]   DULoxetine (CYMBALTA) 60 & 30 MG capsule   NEXT APPT 08-15-23 LAST APPT 02-16-23

## 2023-08-13 NOTE — Addendum Note (Signed)
 Addended by: Thresa Ross on: 08/13/2023 11:24 AM   Modules accepted: Orders

## 2023-08-15 ENCOUNTER — Encounter (HOSPITAL_COMMUNITY): Payer: Self-pay | Admitting: Psychiatry

## 2023-08-15 ENCOUNTER — Telehealth (INDEPENDENT_AMBULATORY_CARE_PROVIDER_SITE_OTHER): Admitting: Psychiatry

## 2023-08-15 DIAGNOSIS — F411 Generalized anxiety disorder: Secondary | ICD-10-CM | POA: Diagnosis not present

## 2023-08-15 DIAGNOSIS — F331 Major depressive disorder, recurrent, moderate: Secondary | ICD-10-CM

## 2023-08-15 DIAGNOSIS — F5102 Adjustment insomnia: Secondary | ICD-10-CM

## 2023-08-15 DIAGNOSIS — F4321 Adjustment disorder with depressed mood: Secondary | ICD-10-CM

## 2023-08-15 NOTE — Progress Notes (Signed)
 Follow up tele psych  visit   Patient Identification: Ashley Gomez MRN:  [id number] Date of Evaluation:  08/15/2023 Referral Source: Mary . Counsellor      Chief Complaint:   depression follow up   Visit Diagnosis:    ICD-10-CM   1. Depression, major, recurrent, moderate (HCC)  F33.1     2. Generalized anxiety disorder  F41.1       Virtual Visit via Video Note  I connected with Ashley Gomez on 08/15/23 at 11:00 AM EDT by a video enabled telemedicine application and verified that I am speaking with the correct person using two identifiers.  Location: Patient: home Provider: home office   I discussed the limitations of evaluation and management by telemedicine and the availability of in person appointments. The patient expressed understanding and agreed to proceed.     I discussed the assessment and treatment plan with the patient. The patient was provided an opportunity to ask questions and all were answered. The patient agreed with the plan and demonstrated an understanding of the instructions.   The patient was advised to call back or seek an in-person evaluation if the symptoms worsen or if the condition fails to improve as anticipated.  I provided 20 minutes of non-face-to-face time during this encounter.     History of Present Illness: 53  years old currently single Caucasian female initially referred by her counselor for management of depression anxiety possible PTSD  Last visit was 6months ago and didn't follow up, now making appointment as refills were due Says been off cymbalta and was going into depression, has been feeling subdued, did get the refill a few days ago and feel it may take some time Also got discharged from therapy due to no shows, but asking if she can get back into it  I discussed if not then look for other therapist  Overal meds keep balance when she is compliant, discussed stressors and to work on distractions.     On pain meds for management   Modifying factors; grand kids Aggravating factor: daughters death in past  Severity : pain effects mood, feeling subdued  Past Psychiatric History: depression, anxiety  Previous Psychotropic Medications: Yes   Substance Abuse History in the last 12 months:  No.  Consequences of Substance Abuse: NA  Past Medical History:  Past Medical History:  Diagnosis Date   Anxiety    Arthritis    CVA (cerebral infarction)    Old CVA on MRI brain   Depression    DVT (deep venous thrombosis) (HCC) 12/13/2009   L basilic vein    Facet hypertrophy of lumbar region    MRI 2007   Fibromyalgia    GERD (gastroesophageal reflux disease)    Hypertension    Migraines    MS (multiple sclerosis) (HCC)    Neuromuscular disorder (HCC)    Obesity    Pre-diabetes    PTSD (post-traumatic stress disorder)    Seizures (HCC) 2011   no seizure since onset   Smoking    Stroke [facility])    TIA, >8years ago    Past Surgical History:  Procedure Laterality Date   CHOLECYSTECTOMY  10/2017   CHONDROPLASTY Left 12/30/2014   Procedure: CHONDROPLASTY;  Surgeon: Tarry Kos, MD;  Location:  SURGERY CENTER;  Service: Orthopedics;  Laterality: Left;   KNEE ARTHROSCOPY Left 12/30/2014   Procedure: LEFT KNEE ARTHROSCOPY WITH   CHONDROPLASTY;  Surgeon: Coralyn Mark  Donnelly Stager, MD;  Location: [facility];  Service: Orthopedics;  Laterality: Left;   PARTIAL KNEE ARTHROPLASTY Left 08/03/2016   Procedure: LEFT UNICOMPARTMENTAL KNEE ARTHROPLASTY;  Surgeon: Tarry Kos, MD;  Location: [facility];  Service: Orthopedics;  Laterality: Left;   SHOULDER ARTHROSCOPY WITH BICEPSTENOTOMY Left 01/15/2020   Procedure: SHOULDER ARTHROSCOPY WITH BICEPSTENOTOMY;  Surgeon: Bjorn Pippin, MD;  Location: [facility];  Service: Orthopedics;  Laterality: Left;   SHOULDER ARTHROSCOPY WITH DISTAL CLAVICLE RESECTION Left 01/15/2020   Procedure: SHOULDER ARTHROSCOPY WITH DISTAL  CLAVICULECTOMY;  Surgeon: Bjorn Pippin, MD;  Location: [facility];  Service: Orthopedics;  Laterality: Left;   SHOULDER ARTHROSCOPY WITH ROTATOR CUFF REPAIR AND SUBACROMIAL DECOMPRESSION Left 01/15/2020   Procedure: SHOULDER ARTHROSCOPY DEBRIDMENT WITH ROTATOR CUFF REPAIR, SUBACROMIAL DECOMPRESSION WITH ACROMIPLASTY, AND BICEP TENOTOMY;  Surgeon: Bjorn Pippin, MD;  Location: [facility];  Service: Orthopedics;  Laterality: Left;   TONSILLECTOMY     TUBAL LIGATION  1992    Family Psychiatric History: Parents: alcohol use disorder  Family History:  Family History  Problem Relation Age of Onset   Cancer Mother 87       melanoma   Hypertension Sister    Heart disease Sister        valve disease   Depression Sister    Diabetes Sister    Sjogren's syndrome Sister     Social History:   Social History   Socioeconomic History   Marital status: Divorced    Spouse name: Not on file   Number of children: 3   Years of education: 12   Highest education level: GED or equivalent  Occupational History   Occupation: Legally disabled  Tobacco Use   Smoking status: Every Day    Current packs/day: 1.00    Average packs/day: 1 pack/day for 25.0 years (25.0 ttl pk-yrs)    Types: Cigarettes   Smokeless tobacco: Never   Tobacco comments:    Patient currently smoking 2-3 cigarettes a day   Vaping Use   Vaping status: Never Used  Substance and Sexual Activity   Alcohol use: No    Alcohol/week: 0.0 standard drinks of alcohol   Drug use: No   Sexual activity: Yes    Birth control/protection: None, Post-menopausal  Other Topics Concern   Not on file  Social History Narrative   Her son lives with her after a car accident that he was involved in. She had three children, one has deceased. She enjoys reading, watching television and spending time with her children and grandchildren.   Social Drivers of Health   Financial Resource Strain: Medium Risk (09/17/2022)    Received from [facility], [facility]   Overall Financial Resource Strain (CARDIA)    Difficulty of Paying Living Expenses: Somewhat hard  Food Insecurity: Food Insecurity Present (09/17/2022)   Received from [facility], [facility]   Hunger Vital Sign    Worried About Running Out of Food in the Last Year: Sometimes true    Ran Out of Food in the Last Year: Sometimes true  Transportation Needs: No Transportation Needs (09/17/2022)   Received from [employer], [facility]   PRAPARE - Transportation    Lack of Transportation (Medical): No    Lack of Transportation (Non-Medical): No  Physical Activity: Insufficiently Active (09/17/2022)   Received from [facility], [facility]   Exercise Vital Sign    Days of Exercise per Week: 3 days  Minutes of Exercise per Session: 20 min  Stress: Stress Concern Present (09/17/2022)   Received from [facility], [facility] of Occupational Health - Occupational Stress Questionnaire    Feeling of Stress : Rather much  Social Connections: Somewhat Isolated (09/17/2022)   Received from [facility], [facility]   Social Network    How would you rate your social network (family, work, friends)?: Restricted participation with some degree of social isolation      Allergies:   Allergies  Allergen Reactions   Celebrex [Celecoxib] Other (See Comments)    Swelling in extremities   Phenytoin Swelling   Topamax [Topiramate] Other (See Comments)    She reports that this makes her forgetful and causes her to studder   Nucynta [Tapentadol] Other (See Comments)    Headaches, abdominal pain    Ace Inhibitors Cough   Baclofen Nausea Only   Celexa [Citalopram Hydrobromide] Other (See Comments)    Dizziness Tolerated Lexapro    Codeine Nausea Only   Methocarbamol Nausea Only   Sumatriptan Nausea Only   Tizanidine Other (See Comments)    Keeps her awake.    Tramadol Anxiety and Palpitations   Triamcinolone Other (See  Comments)    Steroid flare after joint injection, use other steroid for injections    Metabolic Disorder Labs: Lab Results  Component Value Date   HGBA1C 6.0 (H) 06/05/2022   MPG 126 06/05/2022   MPG 126 08/05/2021   Lab Results  Component Value Date   PROLACTIN 4.3 03/04/2013   Lab Results  Component Value Date   CHOL 207 (H) 08/09/2020   TRIG 422 (H) 08/09/2020   HDL 33 (L) 08/09/2020   CHOLHDL 6.3 (H) 08/09/2020   VLDL 30 11/29/2016   LDLCALC  08/09/2020     Comment:     . LDL cholesterol not calculated. Triglyceride levels greater than 400 mg/dL invalidate calculated LDL results. . Reference range: <100 . Desirable range <100 mg/dL for primary prevention;   <70 mg/dL for patients with CHD or diabetic patients  with > or = 2 CHD risk factors. Marland Kitchen LDL-C is now calculated using the Martin-Hopkins  calculation, which is a validated novel method providing  better accuracy than the Friedewald equation in the  estimation of LDL-C.  Horald Pollen et al. Lenox Ahr. 1610;960(45): 2061-2068  (http://education.[employer].com/faq/FAQ164)    LDLCALC 98 08/04/2019     Current Medications: Current Outpatient Medications  Medication Sig Dispense Refill   acetaminophen (TYLENOL) 500 MG tablet Take by mouth.     amLODipine (NORVASC) 5 MG tablet TAKE 1 TABLET (5 MG TOTAL) BY MOUTH DAILY. 90 tablet 1   atorvastatin (LIPITOR) 20 MG tablet TAKE 1 TABLET BY MOUTH EVERYDAY AT BEDTIME 90 tablet 3   busPIRone (BUSPAR) 7.5 MG tablet TAKE 1 TABLET BY MOUTH EVERY DAY 90 tablet 0   chlorthalidone (HYGROTON) 25 MG tablet Take 1 tablet by mouth daily.     clindamycin (CLEOCIN T) 1 % external solution Apply topically 2 (two) times daily.     diazepam (VALIUM) 5 MG tablet Take 1 tablet (5 mg total) by mouth daily as needed for anxiety. 30 tablet 3   diclofenac Sodium (VOLTAREN) 1 % GEL APPLY 4 G TOPICALLY 4 (FOUR) TIMES DAILY. TO AFFECTED JOINT. 100 g 11   DULoxetine (CYMBALTA) 30 MG capsule TAKE  1 CAPSULE WITH 60MG  . TOTAL DAILY DOSE IS 90 MG 90 capsule 0   DULoxetine (CYMBALTA) 60 MG capsule Take 1 capsule (60  mg total) by mouth daily. 90 capsule 0   famotidine (PEPCID) 20 MG tablet TAKE 1 TABLET BY MOUTH EVERY DAY 90 tablet 3   HYDROmorphone (DILAUDID) 8 MG tablet Take 1 tablet by mouth every 8 (eight) hours as needed.     levocetirizine (XYZAL) 5 MG tablet Take 5 mg by mouth daily.     metoprolol succinate (TOPROL-XL) 25 MG 24 hr tablet TAKE 1 TABLET BY MOUTH EVERY DAY 30 tablet 0   mupirocin ointment (BACTROBAN) 2 % SMARTSIG:1 Application Topical 2-3 Times Daily     naratriptan (AMERGE) 2.5 MG tablet Take 1 tablet (2.5 mg total) by mouth as needed for migraine. Take one (1) tablet at onset of headache; if returns or does not resolve, may repeat after 4 hours; do not exceed five (5) mg in 24 hours. 10 tablet 0   NARCAN 4 MG/0.1ML LIQD nasal spray kit USE AS DIRECTED     omeprazole (PRILOSEC) 40 MG capsule Take 40 mg by mouth daily.     ondansetron (ZOFRAN-ODT) 8 MG disintegrating tablet Take 8 mg by mouth every 8 (eight) hours as needed.     potassium chloride (KLOR-CON) 10 MEQ tablet Take 20 mEq by mouth 2 (two) times daily.     tirzepatide [facility]) 7.5 MG/0.5ML Pen Inject into the skin.     Vitamin D, Ergocalciferol, (DRISDOL) 1.25 MG (50000 UNIT) CAPS capsule Take 50,000 Units by mouth 2 (two) times a week.     No current facility-administered medications for this visit.     Psychiatric Specialty Exam: Review of Systems  Cardiovascular:  Negative for chest pain.  Neurological:  Negative for tremors.  Psychiatric/Behavioral:  Positive for depression.     Last menstrual period 12/28/2014.There is no height or weight on file to calculate BMI.  General Appearance: casual  Eye Contact:fair  Speech:  Normal Rate  Volume:  Decreased  Mood: fair, somewhat subdued  Affect:  Congruent  Thought Process:  Goal Directed  Orientation:  Full (Time, Place, and Person)  Thought  Content:  Logical  Suicidal Thoughts:  No  Homicidal Thoughts:  No  Memory:  Immediate;   Fair Recent;   Fair  Judgement:  Fair  Insight:  Fair  Psychomotor Activity:   Concentration:  Concentration: Fair and Attention Span: Fair  Recall:  [employer] of Knowledge:Fair  Language: Fair  Akathisia:  No  Handed:  Right  AIMS (if indicated):    Assets:  Desire for Improvement  ADL's:  Intact  Cognition: WNL  Sleep:  fair    Treatment Plan Summary: Medication management and Plan as follows     Prior documentation reviewed    MDD ,recurent moderate: somewhat subdued, has re started meds, will monitor and call earlier if remains subdued, re schedule therapy or outside referrals for counselling Not suicidal, add ME time GAD/ PTSD; baseline, pain effects mood, continue cymbalta and therapy if possible.   Chronic pain: has herniated disc follows with providers for pain  Insomnia: did not endorse, continue sleep hygiene Compliance with appointments discussed FU 6 weeks or within 82m. Call earlier if needed   Thresa Ross, MD 4/2/202511:02 AM

## 2023-08-29 ENCOUNTER — Telehealth: Payer: Self-pay | Admitting: *Deleted

## 2023-08-29 NOTE — Telephone Encounter (Signed)
 Forwarded to [facility] for appointment scheduling.

## 2023-08-29 NOTE — Progress Notes (Signed)
 [facility] Quality Team Note  Name: Ashley Gomez Date of Birth: 21-Dec-1970 MRN: [id number] Date: 08/29/2023  [facility] Quality Team has reviewed this patient's chart, please see recommendations below:  [facility] Quality Other; (Patient needs urine microalbumin/creatinine ratio as well as diabetic eye exam in 2025 for gap closure. Please schedule appt and address these needs)

## 2023-08-31 DIAGNOSIS — K219 Gastro-esophageal reflux disease without esophagitis: Secondary | ICD-10-CM | POA: Diagnosis not present

## 2023-08-31 DIAGNOSIS — G8929 Other chronic pain: Secondary | ICD-10-CM | POA: Diagnosis not present

## 2023-08-31 DIAGNOSIS — Z720 Tobacco use: Secondary | ICD-10-CM | POA: Diagnosis not present

## 2023-08-31 DIAGNOSIS — G4733 Obstructive sleep apnea (adult) (pediatric): Secondary | ICD-10-CM | POA: Diagnosis not present

## 2023-08-31 DIAGNOSIS — E119 Type 2 diabetes mellitus without complications: Secondary | ICD-10-CM | POA: Diagnosis not present

## 2023-08-31 DIAGNOSIS — M5459 Other low back pain: Secondary | ICD-10-CM | POA: Diagnosis not present

## 2023-08-31 DIAGNOSIS — I10 Essential (primary) hypertension: Secondary | ICD-10-CM | POA: Diagnosis not present

## 2023-08-31 DIAGNOSIS — M47816 Spondylosis without myelopathy or radiculopathy, lumbar region: Secondary | ICD-10-CM | POA: Diagnosis not present

## 2023-09-07 NOTE — Telephone Encounter (Signed)
 09/07/23-Left message on patients voicemail to contact the office to schedule an appointment with Dr. Greer Leak for BP and diabetes.

## 2023-09-09 DIAGNOSIS — Z79899 Other long term (current) drug therapy: Secondary | ICD-10-CM | POA: Diagnosis not present

## 2023-09-09 DIAGNOSIS — G8929 Other chronic pain: Secondary | ICD-10-CM | POA: Diagnosis not present

## 2023-09-09 DIAGNOSIS — Z0000 Encounter for general adult medical examination without abnormal findings: Secondary | ICD-10-CM | POA: Diagnosis not present

## 2023-09-09 DIAGNOSIS — Z79891 Long term (current) use of opiate analgesic: Secondary | ICD-10-CM | POA: Diagnosis not present

## 2023-09-13 DIAGNOSIS — M7061 Trochanteric bursitis, right hip: Secondary | ICD-10-CM | POA: Diagnosis not present

## 2023-09-13 DIAGNOSIS — M7062 Trochanteric bursitis, left hip: Secondary | ICD-10-CM | POA: Diagnosis not present

## 2023-09-13 DIAGNOSIS — Z96641 Presence of right artificial hip joint: Secondary | ICD-10-CM | POA: Diagnosis not present

## 2023-09-13 DIAGNOSIS — Z79899 Other long term (current) drug therapy: Secondary | ICD-10-CM | POA: Diagnosis not present

## 2023-09-14 ENCOUNTER — Other Ambulatory Visit: Payer: Self-pay | Admitting: Family Medicine

## 2023-09-14 DIAGNOSIS — I700 Atherosclerosis of aorta: Secondary | ICD-10-CM

## 2023-09-25 DIAGNOSIS — R590 Localized enlarged lymph nodes: Secondary | ICD-10-CM | POA: Diagnosis not present

## 2023-09-25 DIAGNOSIS — M79604 Pain in right leg: Secondary | ICD-10-CM | POA: Diagnosis not present

## 2023-09-25 DIAGNOSIS — M79605 Pain in left leg: Secondary | ICD-10-CM | POA: Diagnosis not present

## 2023-10-08 DIAGNOSIS — Z79899 Other long term (current) drug therapy: Secondary | ICD-10-CM | POA: Diagnosis not present

## 2023-10-08 DIAGNOSIS — G8929 Other chronic pain: Secondary | ICD-10-CM | POA: Diagnosis not present

## 2023-10-08 DIAGNOSIS — M545 Low back pain, unspecified: Secondary | ICD-10-CM | POA: Diagnosis not present

## 2023-10-08 DIAGNOSIS — Z79891 Long term (current) use of opiate analgesic: Secondary | ICD-10-CM | POA: Diagnosis not present

## 2023-10-08 DIAGNOSIS — J3089 Other allergic rhinitis: Secondary | ICD-10-CM | POA: Diagnosis not present

## 2023-10-17 ENCOUNTER — Telehealth (HOSPITAL_COMMUNITY): Admitting: Psychiatry

## 2023-10-17 ENCOUNTER — Encounter (HOSPITAL_COMMUNITY): Payer: Self-pay

## 2023-11-07 DIAGNOSIS — H903 Sensorineural hearing loss, bilateral: Secondary | ICD-10-CM | POA: Diagnosis not present

## 2023-11-07 DIAGNOSIS — H6993 Unspecified Eustachian tube disorder, bilateral: Secondary | ICD-10-CM | POA: Diagnosis not present

## 2023-11-08 DIAGNOSIS — M129 Arthropathy, unspecified: Secondary | ICD-10-CM | POA: Diagnosis not present

## 2023-11-08 DIAGNOSIS — G8929 Other chronic pain: Secondary | ICD-10-CM | POA: Diagnosis not present

## 2023-11-08 DIAGNOSIS — Z79899 Other long term (current) drug therapy: Secondary | ICD-10-CM | POA: Diagnosis not present

## 2023-11-08 DIAGNOSIS — E119 Type 2 diabetes mellitus without complications: Secondary | ICD-10-CM | POA: Diagnosis not present

## 2023-11-08 DIAGNOSIS — M545 Low back pain, unspecified: Secondary | ICD-10-CM | POA: Diagnosis not present

## 2023-11-08 DIAGNOSIS — Z79891 Long term (current) use of opiate analgesic: Secondary | ICD-10-CM | POA: Diagnosis not present

## 2023-11-08 DIAGNOSIS — E7800 Pure hypercholesterolemia, unspecified: Secondary | ICD-10-CM | POA: Diagnosis not present

## 2023-11-12 DIAGNOSIS — Z79899 Other long term (current) drug therapy: Secondary | ICD-10-CM | POA: Diagnosis not present

## 2023-12-07 DIAGNOSIS — G8929 Other chronic pain: Secondary | ICD-10-CM | POA: Diagnosis not present

## 2023-12-07 DIAGNOSIS — Z79899 Other long term (current) drug therapy: Secondary | ICD-10-CM | POA: Diagnosis not present

## 2023-12-07 DIAGNOSIS — I10 Essential (primary) hypertension: Secondary | ICD-10-CM | POA: Diagnosis not present

## 2023-12-07 DIAGNOSIS — M545 Low back pain, unspecified: Secondary | ICD-10-CM | POA: Diagnosis not present

## 2023-12-07 DIAGNOSIS — F17210 Nicotine dependence, cigarettes, uncomplicated: Secondary | ICD-10-CM | POA: Diagnosis not present

## 2023-12-07 DIAGNOSIS — Z79891 Long term (current) use of opiate analgesic: Secondary | ICD-10-CM | POA: Diagnosis not present

## 2023-12-11 DIAGNOSIS — Z79899 Other long term (current) drug therapy: Secondary | ICD-10-CM | POA: Diagnosis not present

## 2023-12-26 DIAGNOSIS — M47816 Spondylosis without myelopathy or radiculopathy, lumbar region: Secondary | ICD-10-CM | POA: Diagnosis not present

## 2023-12-26 DIAGNOSIS — M5136 Other intervertebral disc degeneration, lumbar region with discogenic back pain only: Secondary | ICD-10-CM | POA: Diagnosis not present

## 2023-12-26 DIAGNOSIS — M461 Sacroiliitis, not elsewhere classified: Secondary | ICD-10-CM | POA: Diagnosis not present

## 2024-01-08 ENCOUNTER — Encounter (HOSPITAL_COMMUNITY): Payer: Self-pay | Admitting: Psychiatry

## 2024-01-08 ENCOUNTER — Ambulatory Visit (INDEPENDENT_AMBULATORY_CARE_PROVIDER_SITE_OTHER): Admitting: Psychiatry

## 2024-01-08 VITALS — BP 155/103 | HR 76 | Ht 66.0 in | Wt 218.0 lb

## 2024-01-08 DIAGNOSIS — F331 Major depressive disorder, recurrent, moderate: Secondary | ICD-10-CM

## 2024-01-08 DIAGNOSIS — G4700 Insomnia, unspecified: Secondary | ICD-10-CM

## 2024-01-08 DIAGNOSIS — G8929 Other chronic pain: Secondary | ICD-10-CM

## 2024-01-08 DIAGNOSIS — F4321 Adjustment disorder with depressed mood: Secondary | ICD-10-CM

## 2024-01-08 DIAGNOSIS — F411 Generalized anxiety disorder: Secondary | ICD-10-CM

## 2024-01-08 MED ORDER — BUSPIRONE HCL 7.5 MG PO TABS: 7.5000 mg | ORAL_TABLET | Freq: Every day | ORAL | 0 refills | Status: DC

## 2024-01-08 NOTE — Progress Notes (Signed)
 Follow up tele psych  visit   Patient Identification: Ashley Gomez MRN:  [id number] Date of Evaluation:  01/08/2024 Referral Source: Mary . Counsellor      Chief Complaint:   depression follow up   Visit Diagnosis:    ICD-10-CM   1. Complicated grief  F43.21     2. Depression, major, recurrent, moderate (HCC)  F33.1     3. Generalized anxiety disorder  F41.1       History of Present Illness: 53  years old currently single Caucasian female initially referred by her counselor for management of depression anxiety possible PTSD  On eval doing fair, has stressors and pain which can effect mood We discussed not to continue valium  since she is on pain meds and she agrees to half the dose of valium  for 3 weeks and discontinue  I will add buspar  to bid for anxiety  Also on cymbalta    She  has MS and diverticulitis    Modifying factors;grand kids  Aggravating factor: daughters death in past  Severity : pain effects mood,  Past Psychiatric History: depression, anxiety  Previous Psychotropic Medications: Yes   Substance Abuse History in the last 12 months:  No.  Consequences of Substance Abuse: NA  Past Medical History:  Past Medical History:  Diagnosis Date   Anxiety    Arthritis    CVA (cerebral infarction)    Old CVA on MRI brain   Depression    DVT (deep venous thrombosis) (HCC) 12/13/2009   L basilic vein    Facet hypertrophy of lumbar region    MRI 2007   Fibromyalgia    GERD (gastroesophageal reflux disease)    Hypertension    Migraines    MS (multiple sclerosis) (HCC)    Neuromuscular disorder (HCC)    Obesity    Pre-diabetes    PTSD (post-traumatic stress disorder)    Seizures (HCC) 2011   no seizure since onset   Smoking    Stroke [facility])    TIA, >8years ago    Past Surgical History:  Procedure Laterality Date   CHOLECYSTECTOMY  10/2017   CHONDROPLASTY Left 12/30/2014   Procedure: CHONDROPLASTY;  Surgeon: Kay CHRISTELLA Cummins,  MD;  Location: [facility];  Service: Orthopedics;  Laterality: Left;   KNEE ARTHROSCOPY Left 12/30/2014   Procedure: LEFT KNEE ARTHROSCOPY WITH   CHONDROPLASTY;  Surgeon: Kay CHRISTELLA Cummins, MD;  Location: [facility];  Service: Orthopedics;  Laterality: Left;   PARTIAL KNEE ARTHROPLASTY Left 08/03/2016   Procedure: LEFT UNICOMPARTMENTAL KNEE ARTHROPLASTY;  Surgeon: Kay CHRISTELLA Cummins, MD;  Location: [facility];  Service: Orthopedics;  Laterality: Left;   SHOULDER ARTHROSCOPY WITH BICEPSTENOTOMY Left 01/15/2020   Procedure: SHOULDER ARTHROSCOPY WITH BICEPSTENOTOMY;  Surgeon: Cristy Bonner DASEN, MD;  Location: [facility];  Service: Orthopedics;  Laterality: Left;   SHOULDER ARTHROSCOPY WITH DISTAL CLAVICLE RESECTION Left 01/15/2020   Procedure: SHOULDER ARTHROSCOPY WITH DISTAL CLAVICULECTOMY;  Surgeon: Cristy Bonner DASEN, MD;  Location: [facility];  Service: Orthopedics;  Laterality: Left;   SHOULDER ARTHROSCOPY WITH ROTATOR CUFF REPAIR AND SUBACROMIAL DECOMPRESSION Left 01/15/2020   Procedure: SHOULDER ARTHROSCOPY DEBRIDMENT WITH ROTATOR CUFF REPAIR, SUBACROMIAL DECOMPRESSION WITH ACROMIPLASTY, AND BICEP TENOTOMY;  Surgeon: Cristy Bonner DASEN, MD;  Location:  SURGERY CENTER;  Service: Orthopedics;  Laterality: Left;   TONSILLECTOMY     TUBAL LIGATION  1992    Family Psychiatric History: Parents: alcohol use  disorder  Family History:  Family History  Problem Relation Age of Onset   Cancer Mother 43       melanoma   Hypertension Sister    Heart disease Sister        valve disease   Depression Sister    Diabetes Sister    Sjogren's syndrome Sister     Social History:   Social History   Socioeconomic History   Marital status: Divorced    Spouse name: Not on file   Number of children: 3   Years of education: 12   Highest education level: GED or equivalent  Occupational History   Occupation: Legally disabled  Tobacco Use   Smoking status: Every Day     Current packs/day: 1.00    Average packs/day: 1 pack/day for 25.0 years (25.0 ttl pk-yrs)    Types: Cigarettes   Smokeless tobacco: Never   Tobacco comments:    Patient currently smoking 2-3 cigarettes a day   Vaping Use   Vaping status: Never Used  Substance and Sexual Activity   Alcohol use: No    Alcohol/week: 0.0 standard drinks of alcohol   Drug use: No   Sexual activity: Yes    Birth control/protection: None, Post-menopausal  Other Topics Concern   Not on file  Social History Narrative   Her son lives with her after a car accident that he was involved in. She had three children, one has deceased. She enjoys reading, watching television and spending time with her children and grandchildren.   Social Drivers of Health   Financial Resource Strain: Medium Risk (09/10/2023)   Received from [facility]   Overall Financial Resource Strain (CARDIA)    Difficulty of Paying Living Expenses: Somewhat hard  Food Insecurity: Food Insecurity Present (09/10/2023)   Received from [facility]   Hunger Vital Sign    Within the past 12 months, you worried that your food would run out before you got the money to buy more.: Never true    Within the past 12 months, the food you bought just didn't last and you didn't have money to get more.: Sometimes true  Transportation Needs: No Transportation Needs (09/10/2023)   Received from [facility] - Transportation    Lack of Transportation (Medical): No    Lack of Transportation (Non-Medical): No  Physical Activity: Unknown (09/10/2023)   Received from [facility]   Exercise Vital Sign    On average, how many days per week do you engage in moderate to strenuous exercise (like a brisk walk)?: 0 days    Minutes of Exercise per Session: Not on file  Stress: Stress Concern Present (09/10/2023)   Received from [facility] - Occupational Stress Questionnaire    Feeling of Stress : To some  extent  Social Connections: Somewhat Isolated (09/10/2023)   Received from [facility]   Social Network    How would you rate your social network (family, work, friends)?: Restricted participation with some degree of social isolation      Allergies:   Allergies  Allergen Reactions   Celebrex  [Celecoxib ] Other (See Comments)    Swelling in extremities   Phenytoin Swelling   Topamax  [Topiramate ] Other (See Comments)    She reports that this makes her forgetful and causes her to studder   Nucynta [Tapentadol] Other (See Comments)    Headaches, abdominal pain    Ace Inhibitors Cough   Baclofen Nausea Only  Celexa  [Citalopram  Hydrobromide] Other (See Comments)    Dizziness Tolerated Lexapro     Codeine  Nausea Only   Methocarbamol  Nausea Only   Sumatriptan  Nausea Only   Tizanidine Other (See Comments)    Keeps her awake.    Tramadol Anxiety and Palpitations   Triamcinolone  Other (See Comments)    Steroid flare after joint injection, use other steroid for injections    Metabolic Disorder Labs: Lab Results  Component Value Date   HGBA1C 6.0 (H) 06/05/2022   MPG 126 06/05/2022   MPG 126 08/05/2021   Lab Results  Component Value Date   PROLACTIN 4.3 03/04/2013   Lab Results  Component Value Date   CHOL 207 (H) 08/09/2020   TRIG 422 (H) 08/09/2020   HDL 33 (L) 08/09/2020   CHOLHDL 6.3 (H) 08/09/2020   VLDL 30 11/29/2016   LDLCALC  08/09/2020     Comment:     . LDL cholesterol not calculated. Triglyceride levels greater than 400 mg/dL invalidate calculated LDL results. . Reference range: <100 . Desirable range <100 mg/dL for primary prevention;   <70 mg/dL for patients with CHD or diabetic patients  with > or = 2 CHD risk factors. SABRA LDL-C is now calculated using the Martin-Hopkins  calculation, which is a validated novel method providing  better accuracy than the Friedewald equation in the  estimation of LDL-C.  Gladis APPLETHWAITE et al. SANDREA. 7986;689(80): 2061-2068   (http://education.[employer].com/faq/FAQ164)    LDLCALC 98 08/04/2019     Current Medications: Current Outpatient Medications  Medication Sig Dispense Refill   acetaminophen  (TYLENOL ) 500 MG tablet Take by mouth.     amLODipine  (NORVASC ) 5 MG tablet TAKE 1 TABLET (5 MG TOTAL) BY MOUTH DAILY. 90 tablet 1   atorvastatin  (LIPITOR) 20 MG tablet TAKE 1 TABLET BY MOUTH EVERYDAY AT BEDTIME 90 tablet 3   chlorthalidone  (HYGROTON ) 25 MG tablet Take 1 tablet by mouth daily.     clindamycin (CLEOCIN T) 1 % external solution Apply topically 2 (two) times daily.     diazepam  (VALIUM ) 5 MG tablet Take 1 tablet (5 mg total) by mouth daily as needed for anxiety. 30 tablet 3   DULoxetine  (CYMBALTA ) 30 MG capsule TAKE 1 CAPSULE WITH 60MG  . TOTAL DAILY DOSE IS 90 MG 90 capsule 0   DULoxetine  (CYMBALTA ) 60 MG capsule Take 1 capsule (60 mg total) by mouth daily. 90 capsule 0   famotidine  (PEPCID ) 20 MG tablet TAKE 1 TABLET BY MOUTH EVERY DAY 90 tablet 3   HYDROmorphone  (DILAUDID ) 8 MG tablet Take 1 tablet by mouth every 8 (eight) hours as needed.     levocetirizine (XYZAL) 5 MG tablet Take 5 mg by mouth daily.     metoprolol  succinate (TOPROL -XL) 25 MG 24 hr tablet TAKE 1 TABLET BY MOUTH EVERY DAY 30 tablet 0   mupirocin  ointment (BACTROBAN ) 2 % SMARTSIG:1 Application Topical 2-3 Times Daily     NARCAN 4 MG/0.1ML LIQD nasal spray kit USE AS DIRECTED     omeprazole  (PRILOSEC) 40 MG capsule Take 40 mg by mouth daily.     ondansetron  (ZOFRAN -ODT) 8 MG disintegrating tablet Take 8 mg by mouth every 8 (eight) hours as needed.     potassium chloride  (KLOR-CON ) 10 MEQ tablet Take 20 mEq by mouth 2 (two) times daily.     tirzepatide (MOUNJARO) 7.5 MG/0.5ML Pen Inject into the skin.     Ubrogepant (UBRELVY) 100 MG TABS Take 100 mg by mouth every 2 (two) hours as needed.  Vitamin D , Ergocalciferol , (DRISDOL ) 1.25 MG (50000 UNIT) CAPS capsule Take 50,000 Units by mouth 2 (two) times a week.     busPIRone   (BUSPAR ) 7.5 MG tablet Take 1 tablet (7.5 mg total) by mouth daily. 60 tablet 0   diclofenac  Sodium (VOLTAREN ) 1 % GEL APPLY 4 G TOPICALLY 4 (FOUR) TIMES DAILY. TO AFFECTED JOINT. (Patient not taking: Reported on 01/08/2024) 100 g 11   naratriptan  (AMERGE) 2.5 MG tablet Take 1 tablet (2.5 mg total) by mouth as needed for migraine. Take one (1) tablet at onset of headache; if returns or does not resolve, may repeat after 4 hours; do not exceed five (5) mg in 24 hours. (Patient not taking: Reported on 01/08/2024) 10 tablet 0   No current facility-administered medications for this visit.     Psychiatric Specialty Exam: Review of Systems  Cardiovascular:  Negative for chest pain.  Neurological:  Negative for tremors.  Psychiatric/Behavioral:  Positive for depression.     Blood pressure (!) 155/103, pulse 76, height 5' 6 (1.676 m), weight 218 lb (98.9 kg), last menstrual period 12/28/2014, SpO2 94%.Body mass index is 35.19 kg/m.  General Appearance: casual  Eye Contact:fair  Speech:  Normal Rate  Volume:  Decreased  Mood:fair  Affect:  Congruent  Thought Process:  Goal Directed  Orientation:  Full (Time, Place, and Person)  Thought Content:  Logical  Suicidal Thoughts:  No  Homicidal Thoughts:  No  Memory:  Immediate;   Fair Recent;   Fair  Judgement:  Fair  Insight:  Fair  Psychomotor Activity:   Concentration:  Concentration: Fair and Attention Span: Fair  Recall:  [employer] of Knowledge:Fair  Language: Fair  Akathisia:  No  Handed:  Right  AIMS (if indicated):    Assets:  Desire for Improvement  ADL's:  Intact  Cognition: WNL  Sleep:  fair    Treatment Plan Summary: Medication management and Plan as follows   Prior documentation reviewed    MDD ,recurent moderate:  fair continue cymbalta  Denies chest pain or palpitations . States her BP runs high till she takes her pain med and she hasn't taken it yet, I cautioned to take it the earliest and follow up for concerns  and monitor her BP  GAD/ PTSD;  baseline, can lower valium  to half for few weeks and discontinue increase buspar  to bid, continue cymbalta   Chronic pain; has herniated disc and MS , is on pain meds and understand not to take with valium    Insomnia:  baseline, continue sleep hygiene   Direct care time spent 20 min including chart review , face to face and documentation    Jackey Flight, MD 8/26/20251:16 PM

## 2024-01-09 DIAGNOSIS — M545 Low back pain, unspecified: Secondary | ICD-10-CM | POA: Diagnosis not present

## 2024-01-09 DIAGNOSIS — Z79891 Long term (current) use of opiate analgesic: Secondary | ICD-10-CM | POA: Diagnosis not present

## 2024-01-09 DIAGNOSIS — Z780 Asymptomatic menopausal state: Secondary | ICD-10-CM | POA: Diagnosis not present

## 2024-01-09 DIAGNOSIS — Z79899 Other long term (current) drug therapy: Secondary | ICD-10-CM | POA: Diagnosis not present

## 2024-01-09 DIAGNOSIS — G8929 Other chronic pain: Secondary | ICD-10-CM | POA: Diagnosis not present

## 2024-01-15 ENCOUNTER — Encounter: Payer: Self-pay | Admitting: Sports Medicine

## 2024-01-31 DIAGNOSIS — R3129 Other microscopic hematuria: Secondary | ICD-10-CM | POA: Diagnosis not present

## 2024-01-31 DIAGNOSIS — R319 Hematuria, unspecified: Secondary | ICD-10-CM | POA: Diagnosis not present

## 2024-01-31 DIAGNOSIS — N321 Vesicointestinal fistula: Secondary | ICD-10-CM | POA: Diagnosis not present

## 2024-01-31 DIAGNOSIS — K5792 Diverticulitis of intestine, part unspecified, without perforation or abscess without bleeding: Secondary | ICD-10-CM | POA: Diagnosis not present

## 2024-02-05 DIAGNOSIS — R0602 Shortness of breath: Secondary | ICD-10-CM | POA: Diagnosis not present

## 2024-02-05 DIAGNOSIS — Z79899 Other long term (current) drug therapy: Secondary | ICD-10-CM | POA: Diagnosis not present

## 2024-02-05 DIAGNOSIS — R110 Nausea: Secondary | ICD-10-CM | POA: Diagnosis not present

## 2024-02-05 DIAGNOSIS — I10 Essential (primary) hypertension: Secondary | ICD-10-CM | POA: Diagnosis not present

## 2024-02-05 DIAGNOSIS — M545 Low back pain, unspecified: Secondary | ICD-10-CM | POA: Diagnosis not present

## 2024-02-05 DIAGNOSIS — G8929 Other chronic pain: Secondary | ICD-10-CM | POA: Diagnosis not present

## 2024-02-12 DIAGNOSIS — N321 Vesicointestinal fistula: Secondary | ICD-10-CM | POA: Diagnosis not present

## 2024-02-13 DIAGNOSIS — K5792 Diverticulitis of intestine, part unspecified, without perforation or abscess without bleeding: Secondary | ICD-10-CM | POA: Diagnosis not present

## 2024-02-13 DIAGNOSIS — K5730 Diverticulosis of large intestine without perforation or abscess without bleeding: Secondary | ICD-10-CM | POA: Diagnosis not present

## 2024-02-13 DIAGNOSIS — I77811 Abdominal aortic ectasia: Secondary | ICD-10-CM | POA: Diagnosis not present

## 2024-02-13 DIAGNOSIS — E278 Other specified disorders of adrenal gland: Secondary | ICD-10-CM | POA: Diagnosis not present

## 2024-02-13 DIAGNOSIS — N321 Vesicointestinal fistula: Secondary | ICD-10-CM | POA: Diagnosis not present

## 2024-02-25 NOTE — Progress Notes (Signed)
 Ashley Gomez                                          MRN: [id number]   02/25/2024   The [facility] Quality Team Specialist reviewed this patient medical record for the purposes of chart review for care gap closure. The following were reviewed: chart review for care gap closure-glycemic status assessment.    [facility] Quality Team

## 2024-03-03 DIAGNOSIS — G8929 Other chronic pain: Secondary | ICD-10-CM | POA: Diagnosis not present

## 2024-03-03 DIAGNOSIS — F17210 Nicotine dependence, cigarettes, uncomplicated: Secondary | ICD-10-CM | POA: Diagnosis not present

## 2024-03-03 DIAGNOSIS — R030 Elevated blood-pressure reading, without diagnosis of hypertension: Secondary | ICD-10-CM | POA: Diagnosis not present

## 2024-03-03 DIAGNOSIS — Z79899 Other long term (current) drug therapy: Secondary | ICD-10-CM | POA: Diagnosis not present

## 2024-03-03 DIAGNOSIS — Z681 Body mass index (BMI) 19 or less, adult: Secondary | ICD-10-CM | POA: Diagnosis not present

## 2024-03-03 DIAGNOSIS — M545 Low back pain, unspecified: Secondary | ICD-10-CM | POA: Diagnosis not present

## 2024-03-12 ENCOUNTER — Telehealth: Payer: Self-pay

## 2024-03-12 NOTE — Telephone Encounter (Signed)
 Called the number on file x2. Rang several times and then was told not valid try again. I have also sent mychart message. Patient is overdue for appointment with [facility] for chronic medical issues.

## 2024-04-09 ENCOUNTER — Telehealth (HOSPITAL_COMMUNITY): Admitting: Psychiatry

## 2024-04-09 ENCOUNTER — Encounter (HOSPITAL_COMMUNITY): Payer: Self-pay

## 2024-04-09 ENCOUNTER — Telehealth (HOSPITAL_COMMUNITY): Payer: Self-pay | Admitting: Psychiatry

## 2024-04-09 DIAGNOSIS — F411 Generalized anxiety disorder: Secondary | ICD-10-CM

## 2024-04-09 DIAGNOSIS — F331 Major depressive disorder, recurrent, moderate: Secondary | ICD-10-CM

## 2024-04-09 MED ORDER — DULOXETINE HCL 30 MG PO CPEP: ORAL_CAPSULE | ORAL | 0 refills | Status: AC

## 2024-04-09 MED ORDER — BUSPIRONE HCL 7.5 MG PO TABS: 7.5000 mg | ORAL_TABLET | Freq: Every day | ORAL | 0 refills | Status: DC

## 2024-04-09 MED ORDER — DULOXETINE HCL 60 MG PO CPEP: 60.0000 mg | ORAL_CAPSULE | Freq: Every day | ORAL | 0 refills | Status: AC

## 2024-04-09 NOTE — Telephone Encounter (Signed)
 Patient unable to connect for today's virtual appointment. Requests refills of:   busPIRone  (BUSPAR ) 7.5 MG tablet  Last ordered: 01/08/2024 - 60 tablets  DULoxetine  (CYMBALTA ) 30 MG capsule  Last ordered: 08/08/2023 - 90 capsules  DULoxetine  (CYMBALTA ) 60 MG capsule  Last ordered: 08/13/2023 - 90 capsules  [id number] - [place], [place] - [address] AT [facility] [address] Phone: [phone]  Fax: [phone]     Last visit: 01/08/2024 Next visit: 05/06/2024

## 2024-05-06 ENCOUNTER — Ambulatory Visit (HOSPITAL_COMMUNITY): Admitting: Psychiatry

## 2024-05-20 ENCOUNTER — Other Ambulatory Visit: Payer: Self-pay | Admitting: Family Medicine

## 2024-05-20 ENCOUNTER — Ambulatory Visit (HOSPITAL_COMMUNITY): Admitting: Psychiatry

## 2024-05-20 DIAGNOSIS — Z1231 Encounter for screening mammogram for malignant neoplasm of breast: Secondary | ICD-10-CM

## 2024-06-04 ENCOUNTER — Other Ambulatory Visit (HOSPITAL_COMMUNITY): Payer: Self-pay | Admitting: Psychiatry

## 2024-06-11 ENCOUNTER — Encounter: Payer: Self-pay | Admitting: *Deleted

## 2024-06-11 NOTE — Progress Notes (Signed)
 LUNA AUDIA                                          MRN: [id number]   06/11/2024   The [facility] Quality Team Specialist reviewed this patient medical record for the purposes of chart review for care gap closure. The following were reviewed: chart review for care gap closure-glycemic status assessment.    [facility] Quality Team

## 2024-06-19 ENCOUNTER — Encounter

## 2024-06-19 DIAGNOSIS — Z1231 Encounter for screening mammogram for malignant neoplasm of breast: Secondary | ICD-10-CM
